# Patient Record
Sex: Female | Born: 1970 | Race: White | Hispanic: No | Smoking: Former smoker
Health system: Southern US, Community
[De-identification: ages and names within clinical notes are randomized; demographics above are authoritative.]

## PROBLEM LIST (undated history)

## (undated) DIAGNOSIS — I739 Peripheral vascular disease, unspecified: Secondary | ICD-10-CM

## (undated) DIAGNOSIS — K219 Gastro-esophageal reflux disease without esophagitis: Secondary | ICD-10-CM

## (undated) DIAGNOSIS — Z7982 Long term (current) use of aspirin: Secondary | ICD-10-CM

## (undated) DIAGNOSIS — E119 Type 2 diabetes mellitus without complications: Secondary | ICD-10-CM

## (undated) DIAGNOSIS — I700 Atherosclerosis of aorta: Secondary | ICD-10-CM

## (undated) DIAGNOSIS — I82401 Acute embolism and thrombosis of unspecified deep veins of right lower extremity: Secondary | ICD-10-CM

## (undated) DIAGNOSIS — E785 Hyperlipidemia, unspecified: Secondary | ICD-10-CM

## (undated) DIAGNOSIS — I2109 ST elevation (STEMI) myocardial infarction involving other coronary artery of anterior wall: Secondary | ICD-10-CM

## (undated) DIAGNOSIS — Z720 Tobacco use: Secondary | ICD-10-CM

## (undated) DIAGNOSIS — I255 Ischemic cardiomyopathy: Secondary | ICD-10-CM

## (undated) DIAGNOSIS — I5020 Unspecified systolic (congestive) heart failure: Secondary | ICD-10-CM

## (undated) DIAGNOSIS — I219 Acute myocardial infarction, unspecified: Secondary | ICD-10-CM

## (undated) DIAGNOSIS — J449 Chronic obstructive pulmonary disease, unspecified: Secondary | ICD-10-CM

## (undated) DIAGNOSIS — D494 Neoplasm of unspecified behavior of bladder: Secondary | ICD-10-CM

## (undated) DIAGNOSIS — Z7902 Long term (current) use of antithrombotics/antiplatelets: Secondary | ICD-10-CM

## (undated) DIAGNOSIS — Z7901 Long term (current) use of anticoagulants: Secondary | ICD-10-CM

## (undated) DIAGNOSIS — I10 Essential (primary) hypertension: Secondary | ICD-10-CM

## (undated) DIAGNOSIS — I951 Orthostatic hypotension: Secondary | ICD-10-CM

## (undated) DIAGNOSIS — F909 Attention-deficit hyperactivity disorder, unspecified type: Secondary | ICD-10-CM

## (undated) DIAGNOSIS — D329 Benign neoplasm of meninges, unspecified: Secondary | ICD-10-CM

## (undated) DIAGNOSIS — I2510 Atherosclerotic heart disease of native coronary artery without angina pectoris: Secondary | ICD-10-CM

## (undated) HISTORY — DX: Ischemic cardiomyopathy: I25.5

## (undated) HISTORY — DX: Orthostatic hypotension: I95.1

## (undated) HISTORY — PX: CHOLECYSTECTOMY: SHX55

## (undated) HISTORY — DX: Peripheral vascular disease, unspecified: I73.9

## (undated) HISTORY — DX: Type 2 diabetes mellitus without complications: E11.9

## (undated) HISTORY — DX: Tobacco use: Z72.0

## (undated) HISTORY — DX: Hyperlipidemia, unspecified: E78.5

## (undated) HISTORY — DX: Unspecified systolic (congestive) heart failure: I50.20

## (undated) HISTORY — DX: Atherosclerotic heart disease of native coronary artery without angina pectoris: I25.10

---

## 2008-08-09 ENCOUNTER — Emergency Department: Payer: Self-pay | Admitting: Emergency Medicine

## 2011-03-24 ENCOUNTER — Emergency Department: Payer: Self-pay

## 2011-08-02 ENCOUNTER — Emergency Department: Payer: Self-pay | Admitting: Emergency Medicine

## 2011-08-02 LAB — URINALYSIS, COMPLETE
Glucose,UR: >=500 mg/dL (ref 0–75)
Leukocyte Esterase: NEGATIVE
Nitrite: NEGATIVE
Ph: 6 (ref 4.5–8.0)
Protein: NEGATIVE
RBC,UR: NONE SEEN /HPF (ref 0–5)
WBC UR: 1 /HPF (ref 0–5)

## 2012-08-30 ENCOUNTER — Emergency Department: Payer: Self-pay | Admitting: Emergency Medicine

## 2012-08-30 LAB — URINALYSIS, COMPLETE
Bilirubin,UR: NEGATIVE
Ketone: NEGATIVE
Nitrite: NEGATIVE
Ph: 6 (ref 4.5–8.0)
Protein: NEGATIVE
RBC,UR: 1 /HPF (ref 0–5)
Squamous Epithelial: 1

## 2014-03-16 ENCOUNTER — Emergency Department: Payer: Self-pay | Admitting: Emergency Medicine

## 2014-08-03 IMAGING — US US PELV - US TRANSVAGINAL
1 series · 14 of 25 positions shown · non-contrast
Comparison: none

REASON FOR EXAM: pelvic pain
COMMENTS:

PROCEDURE:     US  - US PELVIS EXAM W/TRANSVAGINAL  - August 30, 2012  [DATE]
RESULT:     Comparison: None
TECHNIQUE: Multiple transabdominal gray-scale images and endovaginal
gray-scale images with doppler images of the pelvis performed.

[Series 1: us pelv - us transvaginal · 0.28mm/px · 14 of 44 slices shown]
[im 1/44]
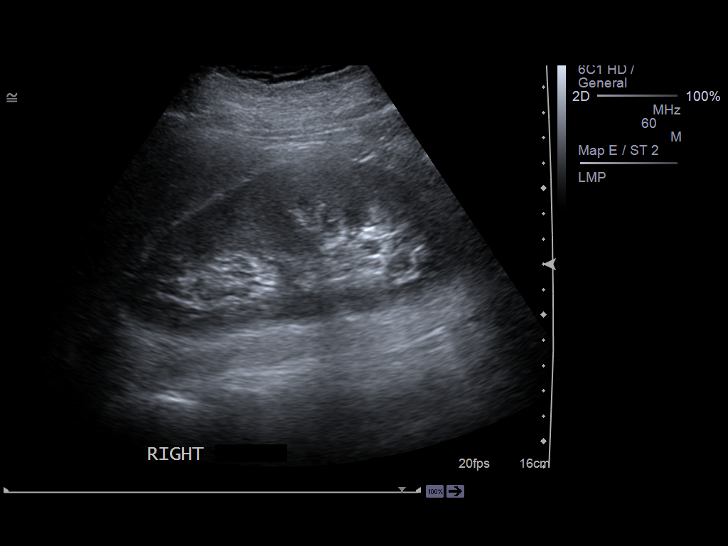
[im 4/44]
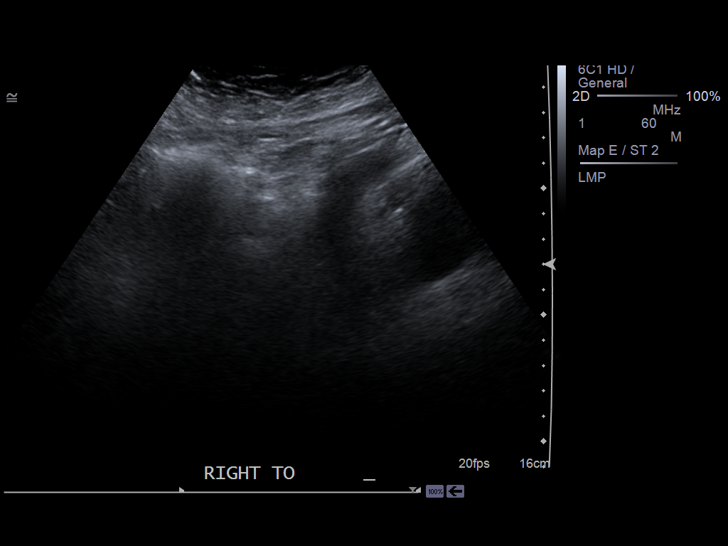
[im 8/44]
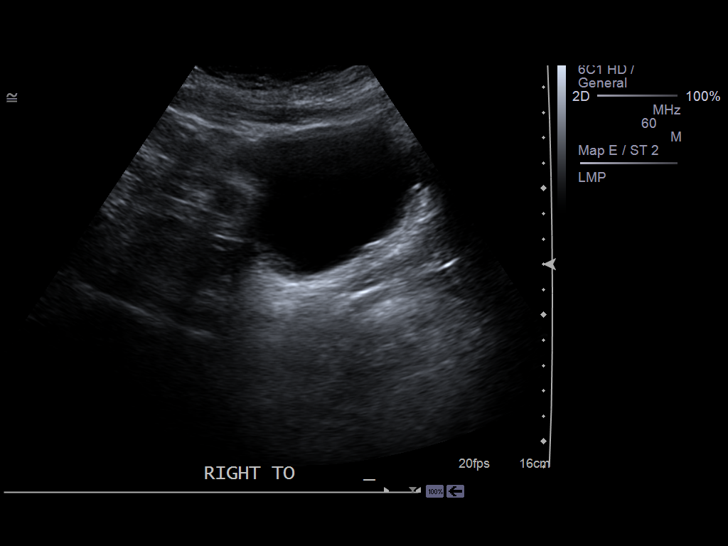
[im 11/44]
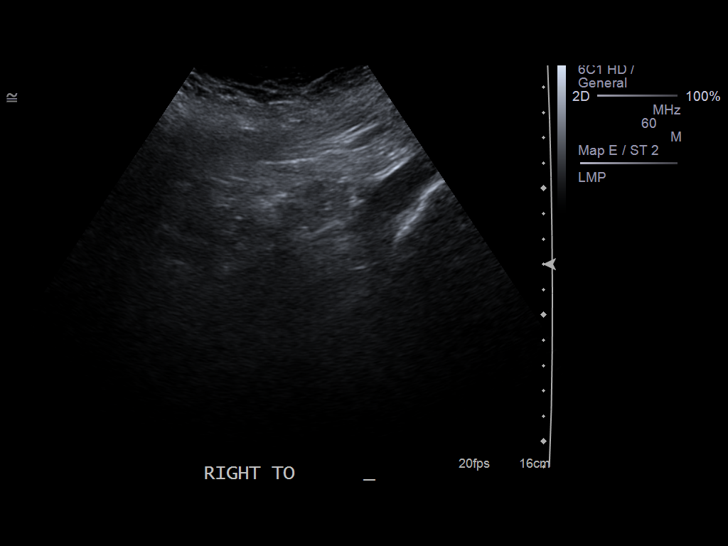
[im 15/44]
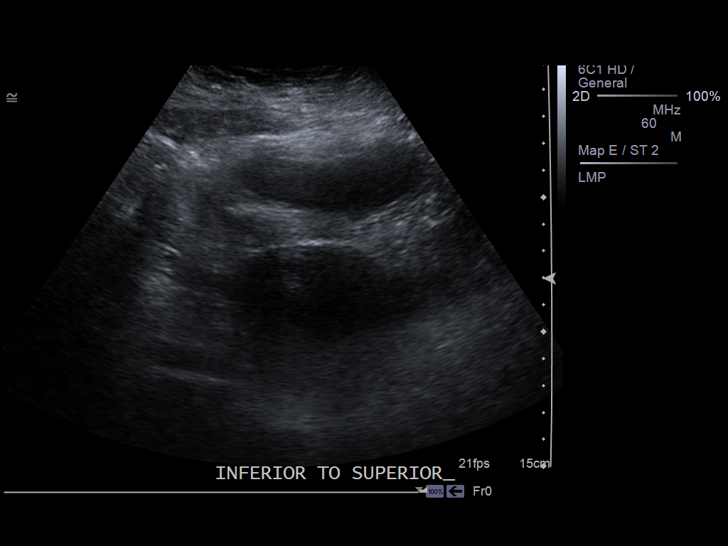
[im 17/44]
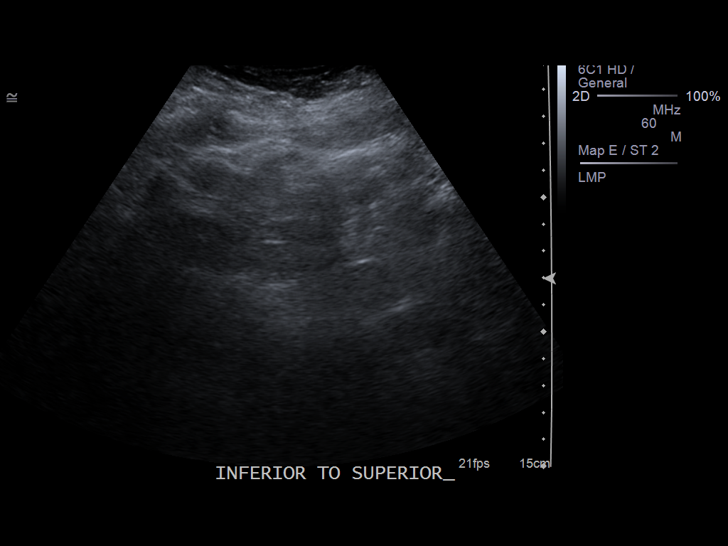
[im 20/44]
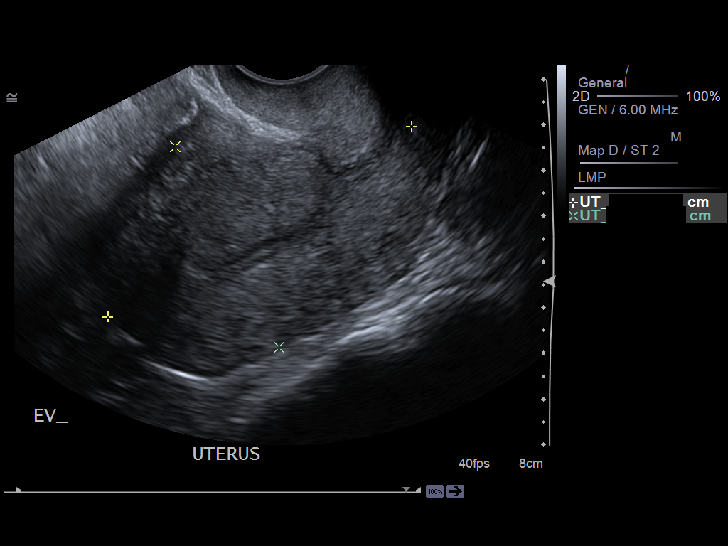
[im 24/44]
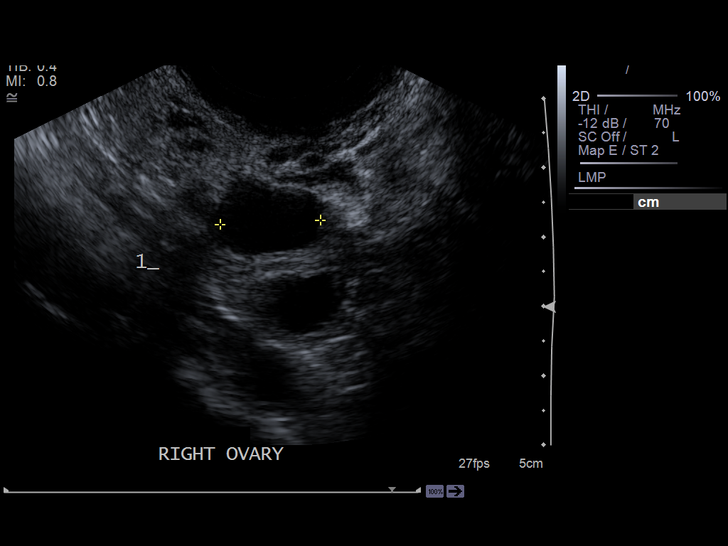
[im 27/44]
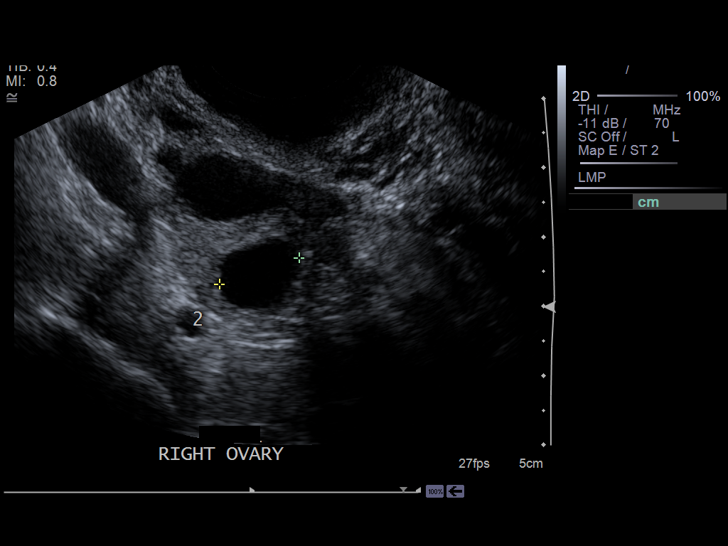
[im 29/44]
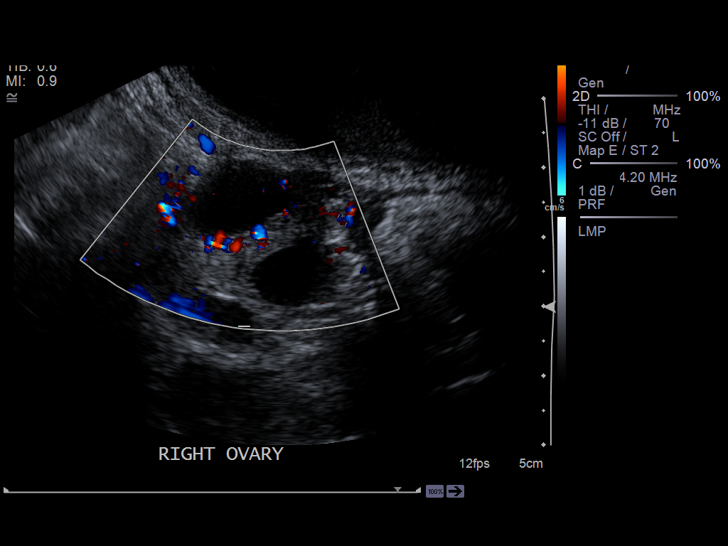
[im 33/44]
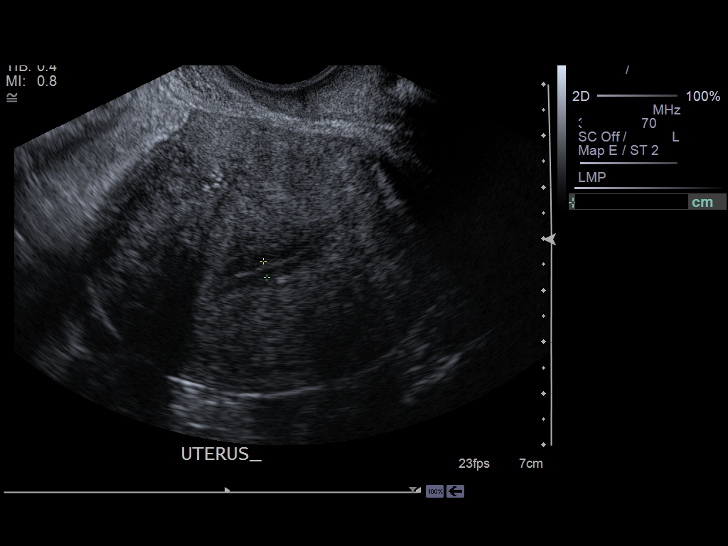
[im 36/44]
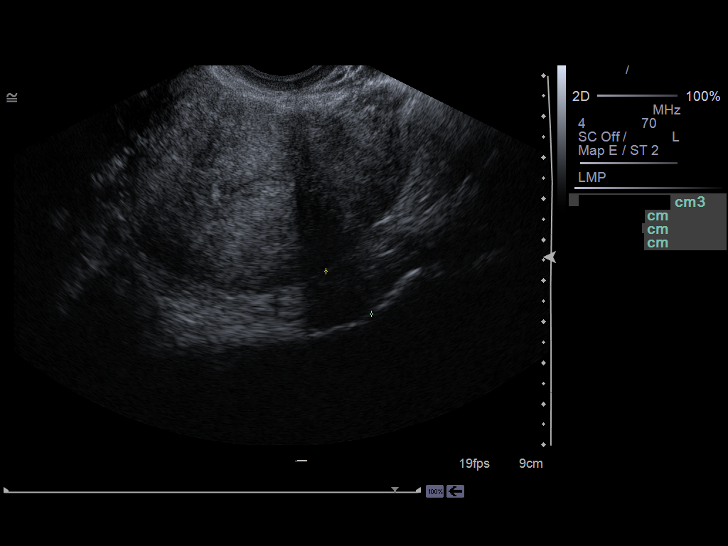
[im 40/44]
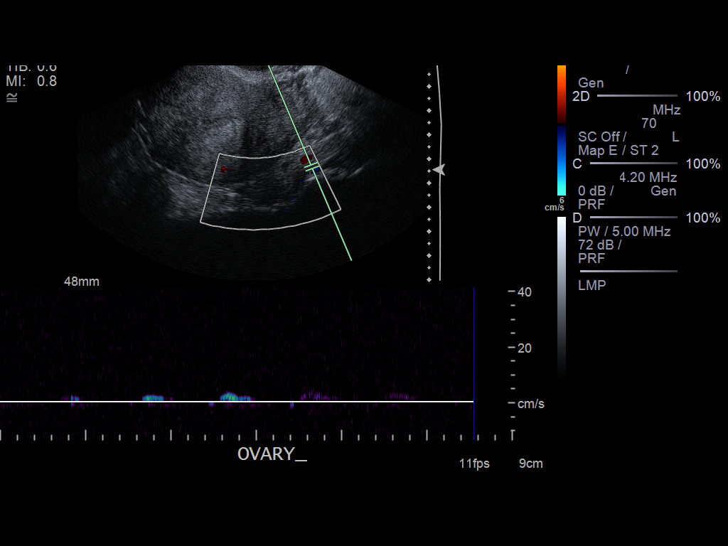
[im 44/44]
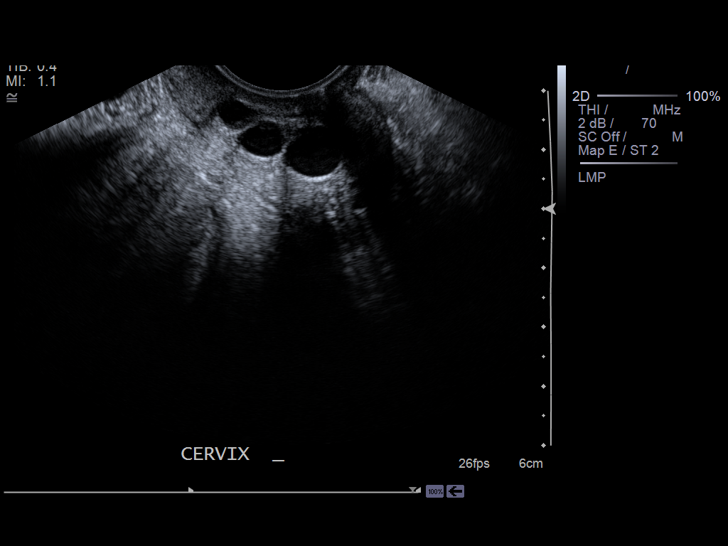

[14 of 25 positions shown; findings below may reference images not displayed]

FINDINGS: The uterus is normal in echotexture measuring 7.8 x 4.9 x 7 cm , with
transabdominal ultrasound. The endometrial stripe is uniform and homogeneous
measuring 3.2 mm.  There are no abnormal solid or cystic myometrial mass
lesions noted.

The right ovary measures 2.7 x 2.3 x 2.2 cm.  The left ovary measures 2.4 x
1.3 x 1.5 cm.  There to right ovarian physiologic ovarian cysts. Normal
Doppler flow is noted in bilateral ovaries.

There is no pelvic free fluid.
IMPRESSION: Normal pelvic ultrasound.

[REDACTED]

## 2019-04-19 ENCOUNTER — Other Ambulatory Visit: Payer: Self-pay | Admitting: Family Medicine

## 2019-04-19 ENCOUNTER — Ambulatory Visit
Admission: RE | Admit: 2019-04-19 | Discharge: 2019-04-19 | Disposition: A | Discharge status: Home / Self Care | Payer: No Typology Code available for payment source | Source: Ambulatory Visit | Attending: Family Medicine | Admitting: Family Medicine

## 2019-04-19 ENCOUNTER — Ambulatory Visit
Admission: RE | Admit: 2019-04-19 | Discharge: 2019-04-19 | Disposition: A | Discharge status: Home / Self Care | Payer: Worker's Compensation | Source: Ambulatory Visit | Attending: Family Medicine | Admitting: Family Medicine

## 2019-04-19 DIAGNOSIS — M25562 Pain in left knee: Secondary | ICD-10-CM | POA: Insufficient documentation

## 2019-04-19 DIAGNOSIS — W19XXXA Unspecified fall, initial encounter: Secondary | ICD-10-CM

## 2019-05-08 DIAGNOSIS — M25562 Pain in left knee: Secondary | ICD-10-CM | POA: Insufficient documentation

## 2019-06-06 DIAGNOSIS — S8002XA Contusion of left knee, initial encounter: Secondary | ICD-10-CM | POA: Insufficient documentation

## 2019-07-10 DIAGNOSIS — M84362A Stress fracture, left tibia, initial encounter for fracture: Secondary | ICD-10-CM | POA: Insufficient documentation

## 2020-10-23 ENCOUNTER — Other Ambulatory Visit: Payer: Self-pay | Admitting: Internal Medicine

## 2020-10-23 DIAGNOSIS — E118 Type 2 diabetes mellitus with unspecified complications: Secondary | ICD-10-CM | POA: Insufficient documentation

## 2020-10-23 DIAGNOSIS — E119 Type 2 diabetes mellitus without complications: Secondary | ICD-10-CM | POA: Insufficient documentation

## 2020-10-23 DIAGNOSIS — Z720 Tobacco use: Secondary | ICD-10-CM | POA: Insufficient documentation

## 2020-10-23 DIAGNOSIS — R10811 Right upper quadrant abdominal tenderness: Secondary | ICD-10-CM

## 2020-10-23 DIAGNOSIS — I10 Essential (primary) hypertension: Secondary | ICD-10-CM | POA: Insufficient documentation

## 2020-11-10 ENCOUNTER — Other Ambulatory Visit: Payer: Self-pay

## 2020-11-10 ENCOUNTER — Ambulatory Visit
Admission: RE | Admit: 2020-11-10 | Discharge: 2020-11-10 | Disposition: A | Discharge status: Home / Self Care | Payer: No Typology Code available for payment source | Source: Ambulatory Visit | Attending: Internal Medicine | Admitting: Internal Medicine

## 2020-11-10 DIAGNOSIS — R10811 Right upper quadrant abdominal tenderness: Secondary | ICD-10-CM | POA: Insufficient documentation

## 2020-11-20 ENCOUNTER — Other Ambulatory Visit
Admission: RE | Admit: 2020-11-20 | Discharge: 2020-11-20 | Disposition: A | Discharge status: Home / Self Care | Payer: No Typology Code available for payment source | Source: Ambulatory Visit | Attending: Internal Medicine | Admitting: Internal Medicine

## 2020-11-20 DIAGNOSIS — M79662 Pain in left lower leg: Secondary | ICD-10-CM | POA: Insufficient documentation

## 2020-11-20 DIAGNOSIS — M7989 Other specified soft tissue disorders: Secondary | ICD-10-CM | POA: Diagnosis present

## 2020-11-20 LAB — D-DIMER, QUANTITATIVE: D-Dimer, Quant: 0.42 ug/mL-FEU (ref 0.00–0.50)

## 2020-12-09 ENCOUNTER — Other Ambulatory Visit: Payer: Self-pay

## 2020-12-09 ENCOUNTER — Inpatient Hospital Stay
Admission: EM | Admit: 2020-12-09 | Discharge: 2020-12-16 | DRG: 270 | Disposition: A | Discharge status: Home / Self Care | Payer: No Typology Code available for payment source | Attending: Internal Medicine | Admitting: Internal Medicine

## 2020-12-09 ENCOUNTER — Emergency Department: Payer: No Typology Code available for payment source

## 2020-12-09 DIAGNOSIS — E1151 Type 2 diabetes mellitus with diabetic peripheral angiopathy without gangrene: Principal | ICD-10-CM | POA: Diagnosis present

## 2020-12-09 DIAGNOSIS — R600 Localized edema: Secondary | ICD-10-CM

## 2020-12-09 DIAGNOSIS — E1165 Type 2 diabetes mellitus with hyperglycemia: Secondary | ICD-10-CM | POA: Diagnosis present

## 2020-12-09 DIAGNOSIS — Z79899 Other long term (current) drug therapy: Secondary | ICD-10-CM

## 2020-12-09 DIAGNOSIS — E876 Hypokalemia: Secondary | ICD-10-CM | POA: Diagnosis present

## 2020-12-09 DIAGNOSIS — E119 Type 2 diabetes mellitus without complications: Secondary | ICD-10-CM | POA: Insufficient documentation

## 2020-12-09 DIAGNOSIS — R55 Syncope and collapse: Secondary | ICD-10-CM | POA: Diagnosis not present

## 2020-12-09 DIAGNOSIS — Z20822 Contact with and (suspected) exposure to covid-19: Secondary | ICD-10-CM | POA: Diagnosis present

## 2020-12-09 DIAGNOSIS — Z8249 Family history of ischemic heart disease and other diseases of the circulatory system: Secondary | ICD-10-CM

## 2020-12-09 DIAGNOSIS — I110 Hypertensive heart disease with heart failure: Secondary | ICD-10-CM | POA: Diagnosis present

## 2020-12-09 DIAGNOSIS — E785 Hyperlipidemia, unspecified: Secondary | ICD-10-CM | POA: Diagnosis present

## 2020-12-09 DIAGNOSIS — I255 Ischemic cardiomyopathy: Secondary | ICD-10-CM | POA: Diagnosis present

## 2020-12-09 DIAGNOSIS — I70223 Atherosclerosis of native arteries of extremities with rest pain, bilateral legs: Secondary | ICD-10-CM | POA: Diagnosis present

## 2020-12-09 DIAGNOSIS — I2510 Atherosclerotic heart disease of native coronary artery without angina pectoris: Secondary | ICD-10-CM | POA: Diagnosis present

## 2020-12-09 DIAGNOSIS — R42 Dizziness and giddiness: Secondary | ICD-10-CM

## 2020-12-09 DIAGNOSIS — I5021 Acute systolic (congestive) heart failure: Secondary | ICD-10-CM | POA: Diagnosis present

## 2020-12-09 DIAGNOSIS — I998 Other disorder of circulatory system: Secondary | ICD-10-CM | POA: Diagnosis not present

## 2020-12-09 DIAGNOSIS — M7989 Other specified soft tissue disorders: Secondary | ICD-10-CM

## 2020-12-09 DIAGNOSIS — R710 Precipitous drop in hematocrit: Secondary | ICD-10-CM | POA: Diagnosis not present

## 2020-12-09 DIAGNOSIS — F17210 Nicotine dependence, cigarettes, uncomplicated: Secondary | ICD-10-CM | POA: Diagnosis present

## 2020-12-09 DIAGNOSIS — I10 Essential (primary) hypertension: Secondary | ICD-10-CM

## 2020-12-09 DIAGNOSIS — I959 Hypotension, unspecified: Secondary | ICD-10-CM | POA: Diagnosis not present

## 2020-12-09 HISTORY — DX: Essential (primary) hypertension: I10

## 2020-12-09 HISTORY — DX: Type 2 diabetes mellitus without complications: E11.9

## 2020-12-09 LAB — CBC
HCT: 39.2 % (ref 36.0–46.0)
Hemoglobin: 13.5 g/dL (ref 12.0–15.0)
MCH: 29 pg (ref 26.0–34.0)
MCHC: 34.4 g/dL (ref 30.0–36.0)
MCV: 84.1 fL (ref 80.0–100.0)
Platelets: 230 10*3/uL (ref 150–400)
RBC: 4.66 MIL/uL (ref 3.87–5.11)
RDW: 12.7 % (ref 11.5–15.5)
WBC: 4.7 10*3/uL (ref 4.0–10.5)
nRBC: 0 % (ref 0.0–0.2)

## 2020-12-09 LAB — BASIC METABOLIC PANEL
Anion gap: 22 — ABNORMAL HIGH (ref 5–15)
Anion gap: 16 — ABNORMAL HIGH (ref 5–15)
BUN: 25 mg/dL — ABNORMAL HIGH (ref 6–20)
BUN: 23 mg/dL — ABNORMAL HIGH (ref 6–20)
CO2: 23 mmol/L (ref 22–32)
CO2: 23 mmol/L (ref 22–32)
Calcium: 9.2 mg/dL (ref 8.9–10.3)
Calcium: 8.5 mg/dL — ABNORMAL LOW (ref 8.9–10.3)
Chloride: 87 mmol/L — ABNORMAL LOW (ref 98–111)
Chloride: 93 mmol/L — ABNORMAL LOW (ref 98–111)
Creatinine, Ser: 0.92 mg/dL (ref 0.44–1.00)
Creatinine, Ser: 0.87 mg/dL (ref 0.44–1.00)
GFR, Estimated: >60 mL/min (ref >60)
GFR, Estimated: >60 mL/min (ref >60)
Glucose, Bld: 392 mg/dL — ABNORMAL HIGH (ref 70–99)
Glucose, Bld: 493 mg/dL — ABNORMAL HIGH (ref 70–99)
Potassium: 2.4 mmol/L — CL (ref 3.5–5.1)
Potassium: 3 mmol/L — ABNORMAL LOW (ref 3.5–5.1)
Sodium: 132 mmol/L — ABNORMAL LOW (ref 135–145)
Sodium: 132 mmol/L — ABNORMAL LOW (ref 135–145)

## 2020-12-09 LAB — PROTIME-INR
INR: 1.2 (ref 0.8–1.2)
Prothrombin Time: 15 seconds (ref 11.4–15.2)

## 2020-12-09 LAB — HEPATIC FUNCTION PANEL
ALT: 12 U/L (ref 0–44)
AST: 15 U/L (ref 15–41)
Albumin: 3.9 g/dL (ref 3.5–5.0)
Alkaline Phosphatase: 59 U/L (ref 38–126)
Bilirubin, Direct: <0.1 mg/dL (ref 0.0–0.2)
Total Bilirubin: 2.2 mg/dL — ABNORMAL HIGH (ref 0.3–1.2)
Total Protein: 7.4 g/dL (ref 6.5–8.1)

## 2020-12-09 LAB — APTT: aPTT: 78 seconds — ABNORMAL HIGH (ref 24–36)

## 2020-12-09 LAB — TROPONIN I (HIGH SENSITIVITY)
Troponin I (High Sensitivity): 28 ng/L — ABNORMAL HIGH (ref <18)
Troponin I (High Sensitivity): 27 ng/L — ABNORMAL HIGH (ref <18)

## 2020-12-09 LAB — BRAIN NATRIURETIC PEPTIDE: B Natriuretic Peptide: 106.3 pg/mL — ABNORMAL HIGH (ref 0.0–100.0)

## 2020-12-09 MED ORDER — HYDROCODONE-ACETAMINOPHEN 5-325 MG PO TABS
1.0000 | ORAL_TABLET | ORAL | Status: DC | PRN
  Administered 2020-12-10 – 2020-12-11 (×5): 1 via ORAL
  Administered 2020-12-12 – 2020-12-13 (×6): 2 via ORAL
  Administered 2020-12-13 – 2020-12-14 (×6): 1 via ORAL
  Administered 2020-12-14 – 2020-12-15 (×3): 2 via ORAL
  Administered 2020-12-15 (×2): 1 via ORAL
  Administered 2020-12-16 (×4): 2 via ORAL
  Filled 2020-12-09 (×4): qty 2
  Filled 2020-12-09 (×2): qty 1
  Filled 2020-12-09: qty 2
  Filled 2020-12-09 (×3): qty 1
  Filled 2020-12-09: qty 2
  Filled 2020-12-09 (×2): qty 1
  Filled 2020-12-09: qty 2
  Filled 2020-12-09 (×2): qty 1
  Filled 2020-12-09 (×7): qty 2
  Filled 2020-12-09: qty 1
  Filled 2020-12-09: qty 2
  Filled 2020-12-09: qty 1

## 2020-12-09 MED ORDER — POTASSIUM CHLORIDE 10 MEQ/100ML IV SOLN
10.0000 meq | INTRAVENOUS | Status: AC
  Administered 2020-12-09 (×2): 10 meq via INTRAVENOUS
  Filled 2020-12-09 (×2): qty 100

## 2020-12-09 MED ORDER — ACETAMINOPHEN 650 MG RE SUPP
650.0000 mg | Freq: Four times a day (QID) | RECTAL | Status: DC | PRN
  Filled 2020-12-09: qty 1

## 2020-12-09 MED ORDER — POTASSIUM CHLORIDE CRYS ER 20 MEQ PO TBCR
40.0000 meq | EXTENDED_RELEASE_TABLET | Freq: Once | ORAL | Status: AC
  Administered 2020-12-09: 40 meq via ORAL
  Filled 2020-12-09: qty 2

## 2020-12-09 MED ORDER — HYDRALAZINE HCL 20 MG/ML IJ SOLN: 5.0000 mg | INTRAMUSCULAR | Status: DC | PRN

## 2020-12-09 MED ORDER — INSULIN ASPART 100 UNIT/ML IJ SOLN
0.0000 [IU] | INTRAMUSCULAR | Status: DC
Start: 2020-12-10 — End: 2020-12-12
  Administered 2020-12-10: 8 [IU] via SUBCUTANEOUS
  Administered 2020-12-10: 15 [IU] via SUBCUTANEOUS
  Administered 2020-12-10: 5 [IU] via SUBCUTANEOUS
  Administered 2020-12-10: 11 [IU] via SUBCUTANEOUS
  Administered 2020-12-11 (×2): 15 [IU] via SUBCUTANEOUS
  Administered 2020-12-11: 12:00:00 3 [IU] via SUBCUTANEOUS
  Administered 2020-12-11: 04:00:00 8 [IU] via SUBCUTANEOUS
  Administered 2020-12-11: 09:00:00 2 [IU] via SUBCUTANEOUS
  Administered 2020-12-12: 8 [IU] via SUBCUTANEOUS
  Administered 2020-12-12: 5 [IU] via SUBCUTANEOUS
  Filled 2020-12-09 (×10): qty 1

## 2020-12-09 MED ORDER — ACETAMINOPHEN 325 MG PO TABS: 650.0000 mg | ORAL_TABLET | Freq: Four times a day (QID) | ORAL | Status: DC | PRN

## 2020-12-09 MED ORDER — MORPHINE SULFATE (PF) 2 MG/ML IV SOLN
2.0000 mg | INTRAVENOUS | Status: DC | PRN
  Administered 2020-12-10 – 2020-12-14 (×12): 2 mg via INTRAVENOUS
  Filled 2020-12-09 (×13): qty 1

## 2020-12-09 MED ORDER — HEPARIN SODIUM (PORCINE) 5000 UNIT/ML IJ SOLN: 4000.0000 [IU] | Freq: Once | INTRAMUSCULAR | Status: DC

## 2020-12-09 MED ORDER — HEPARIN BOLUS VIA INFUSION
3000.0000 [IU] | Freq: Once | INTRAVENOUS | Status: AC
  Administered 2020-12-09: 3000 [IU] via INTRAVENOUS
  Filled 2020-12-09: qty 3000

## 2020-12-09 MED ORDER — ONDANSETRON HCL 4 MG PO TABS: 4.0000 mg | ORAL_TABLET | Freq: Four times a day (QID) | ORAL | Status: DC | PRN

## 2020-12-09 MED ORDER — SODIUM CHLORIDE 0.9 % IV BOLUS
1000.0000 mL | Freq: Once | INTRAVENOUS | Status: AC
  Administered 2020-12-09: 1000 mL via INTRAVENOUS

## 2020-12-09 MED ORDER — HEPARIN (PORCINE) 25000 UT/250ML-% IV SOLN
1000.0000 [IU]/h | INTRAVENOUS | Status: DC
  Administered 2020-12-09: 600 [IU]/h via INTRAVENOUS
  Administered 2020-12-11: 900 [IU]/h via INTRAVENOUS
  Filled 2020-12-09 (×2): qty 250

## 2020-12-09 MED ORDER — ONDANSETRON HCL 4 MG/2ML IJ SOLN
4.0000 mg | Freq: Four times a day (QID) | INTRAMUSCULAR | Status: DC | PRN
  Administered 2020-12-10: 4 mg via INTRAVENOUS
  Filled 2020-12-09: qty 2

## 2020-12-09 NOTE — ED Provider Notes (Signed)
[facility] Emergency Department Provider Note ____________________________________________   Event Date/Time   First MD Initiated Contact with Patient 12/09/20 1718     (approximate)  I have reviewed the triage vital signs and the nursing notes.  HISTORY  Chief Complaint Edema   HPI Beth Carroll is a 51 y.o. femalewho presents to the ED for evaluation of dizziness.   Chart review indicates HTN and DM.  Lifelong smoker.  Reviewed multiple outpatient visits with complaints of lower extremity edema, she has recently finished a round of antibiotics, doxycycline, to cover for cellulitis.  Edema worsening despite compression and elevation.  She was empirically started on Lasix.  Patient presents to the ED for evaluation of presyncopal dizziness.  She reports that her bilateral legs, left greater than right, have been swollen for a few months.  She reports getting a venous ultrasound and ruled out acute DVT during these outpatient visits.  She was started on Lasix and had some mild improvement.  Reports it may have been a little bit better after doxycycline course as well.  She reports it persists and does not know why. Reports increased lower extremity pain with ambulation, improving with rest but taking quite a while to improve.  Further reports some rest pain intermittently..  She reports for the past few days she has felt nausea and poor appetite without abdominal pain or emesis.  Reports poor p.o. intake but continuing to take her Lasix.  Reports worsening presyncopal dizziness upon standing over the past couple days.  Denies any syncopal episodes, falls, vertigo, headache, fever.  Reports no dizziness right now, while supine, but reports concern that she may if she stands up.  She is hungry and requesting food.  Reports her legs seem no different than they have for the past couple months.  Past Medical History:  Diagnosis Date   Diabetes mellitus without  complication ([place])    Hypertension     There are no problems to display for this patient.    Prior to Admission medications   Not on File    Allergies Patient has no known allergies.  No family history on file.  Social History    Review of Systems  Constitutional: No fever/chills Eyes: No visual changes. ENT: No sore throat. Cardiovascular: Denies chest pain. Respiratory: Denies shortness of breath. Gastrointestinal: No abdominal pain.  No nausea, no vomiting.  No diarrhea.  No constipation. Genitourinary: Negative for dysuria. Musculoskeletal: Negative for back pain. Skin: Negative for rash. Neurological: Negative for headaches, focal weakness or numbness. ____________________________________________   PHYSICAL EXAM:  VITAL SIGNS: Vitals:   12/09/20 1858 12/09/20 2048  BP: (!) 149/76 (!) 161/82  Pulse: 81 80  Resp: 20 18  Temp:    SpO2: 94% 94%    Constitutional: Alert and oriented. Well appearing and in no acute distress. Eyes: Conjunctivae are normal. PERRL. EOMI. Head: Atraumatic. Nose: No congestion/rhinnorhea. Mouth/Throat: Mucous membranes are moist.  Oropharynx non-erythematous. Neck: No stridor. No cervical spine tenderness to palpation. Cardiovascular: Normal rate, regular rhythm. Grossly normal heart sounds.    Poor capillary refill to bilateral lower extremities.  Quite slow.  Unable to palpate DP or PT pulses.  Have difficulty with a Doppler obtaining pulses reliably as well.   Respiratory: Normal respiratory effort.  No retractions. Lungs CTAB. Gastrointestinal: Soft , nondistended, nontender to palpation. No CVA tenderness. Musculoskeletal: Edematous bilateral lower extremities, left greater than right.  No warmth to the touch, induration or fluctuance.  Distally neurovascularly intact. Neurologic:  Normal speech and language. No gross focal neurologic deficits are appreciated.  Cranial nerves II through XII intact 5/5 strength and sensation  in all 4 extremities Skin:  Skin is warm, dry and intact. No rash noted. Psychiatric: Mood and affect are normal. Speech and behavior are normal. ____________________________________________   LABS (all labs ordered are listed, but only abnormal results are displayed)  Labs Reviewed  BASIC METABOLIC PANEL - Abnormal; Notable for the following components:      Result Value   Sodium 132 (*)    Potassium 2.4 (*)    Chloride 87 (*)    Glucose, Bld 392 (*)    BUN 25 (*)    Anion gap 22 (*)    All other components within normal limits  HEPATIC FUNCTION PANEL - Abnormal; Notable for the following components:   Total Bilirubin 2.2 (*)    All other components within normal limits  BRAIN NATRIURETIC PEPTIDE - Abnormal; Notable for the following components:   B Natriuretic Peptide 106.3 (*)    All other components within normal limits  TROPONIN I (HIGH SENSITIVITY) - Abnormal; Notable for the following components:   Troponin I (High Sensitivity) 28 (*)    All other components within normal limits  TROPONIN I (HIGH SENSITIVITY) - Abnormal; Notable for the following components:   Troponin I (High Sensitivity) 27 (*)    All other components within normal limits  CBC  BASIC METABOLIC PANEL  PROTIME-INR  APTT   ____________________________________________  12 Lead EKG  Sinus rhythm, rate of 76 bpm.  Leftward axis.  Prolonged QTC at 531 ms.  No STEMI. ____________________________________________  RADIOLOGY  ED MD interpretation:  CXR reviewed by me without evidence of acute cardiopulmonary pathology.   Official radiology report(s): DG Chest 2 View  Result Date: 12/09/2020 CLINICAL DATA:  Bilateral lower extremity swelling. History high blood pressure and diabetes S. EXAM: CHEST - 2 VIEW COMPARISON:  None. FINDINGS: The cardiac silhouette, mediastinal and hilar contours are normal. The lungs are clear of an acute process. Mild hyperinflation. No pleural effusions or pulmonary  lesions. The bony thorax is intact. IMPRESSION: No acute cardiopulmonary findings. Electronically Signed   By: Marijo Sanes M.D.   On: 12/09/2020 15:37    ____________________________________________   PROCEDURES and INTERVENTIONS  Procedure(s) performed (including Critical Care):  .1-3 Lead EKG Interpretation Performed by: Vladimir Crofts, MD Authorized by: Vladimir Crofts, MD     Interpretation: normal     ECG rate:  80   ECG rate assessment: normal     Rhythm: sinus rhythm     Ectopy: none     Conduction: normal   .Critical Care Performed by: Vladimir Crofts, MD Authorized by: Vladimir Crofts, MD   Critical care provider statement:    Critical care time (minutes):  30   Critical care time was exclusive of:  Separately billable procedures and treating other patients   Critical care was necessary to treat or prevent imminent or life-threatening deterioration of the following conditions:  Circulatory failure   Critical care was time spent personally by me on the following activities:  Development of treatment plan with patient or surrogate, discussions with consultants, evaluation of patient's response to treatment, examination of patient, ordering and review of laboratory studies, ordering and review of radiographic studies, ordering and performing treatments and interventions, pulse oximetry, re-evaluation of patient's condition and review of old charts  Medications  heparin injection 4,000 Units (has no administration in time range)  sodium chloride 0.9 % bolus  1,000 mL (0 mLs Intravenous Stopped 12/09/20 1722)  potassium chloride SA (KLOR-CON) CR tablet 40 mEq (40 mEq Oral Given 12/09/20 1821)  potassium chloride 10 mEq in 100 mL IVPB (0 mEq Intravenous Stopped 12/09/20 2036)  potassium chloride SA (KLOR-CON) CR tablet 40 mEq (40 mEq Oral Given 12/09/20 1933)    ____________________________________________   MDM / ED COURSE   50 year old female presents to the ED with progressively  worsening chronic lower extremity edema and pain that I suspect is due to fairly severe PAD, requiring heparinization and plans for angiography tomorrow. 1st BP is hypotensive, but I suspect this is erroneous. She remains stable in the ED. she presents with presyncope with standing, that I suspect is more related to volume depletion and symptomatic hypokalemia, but with more questioning and examination I get the impression of claudication with rest pains and poor peripheral blood flow.  Poor ABIs of about 0.3 on the left, slightly better on the right.  I cannot palpate or reliably Doppler pulse to her feet bilaterally.  She does not have severe pain while supine and I see no indications for emergent angiography, but I think she would benefit from an urgent study.  I discussed the case with vascular surgery, who agrees and plans to perform angiography tomorrow.  Heparinization will be started in the ED.  I suspect her hypokalemia is related to recent initiation of Lasix.  No evidence of significant CHF or respiratory pathology.  Clinical Course as of 12/09/20 2122  Tue Dec 09, 2020  1954 Reassessed.  Patient reports feeling okay.  We discussed repeat BMP, ABIs and ambulatory trial.  We discussed the possibility of outpatient management thereafter with cardiology follow-up.  She is agreeable. [DS]  2113 Reassessed.  ABIs are terrible.  I cannot reliably palpate pulses each feet and capillary refill is quite slow.  I have difficulty even Dopplering a pulse. Discussed my concern with the patient and I paged vascular surgery. [DS]  2115 I discuss with Dr. Lucky Cowboy, he agrees that it would probably be prudent to admit her on heparin and perform angiography tomorrow. [DS]  2120 Discussed this recommendation with the patient and she is agreeable to stay. [DS]    Clinical Course User Index [DS] Vladimir Crofts, MD    ____________________________________________   FINAL CLINICAL IMPRESSION(S) / ED DIAGNOSES  Final  diagnoses:  Left leg swelling  Lower limb ischemia     ED Discharge Orders     None        Riyanna Crutchley Tamala Julian   Note:  This document was prepared using Dragon voice recognition software and may include unintentional dictation errors.    Vladimir Crofts, MD 12/09/20 2138

## 2020-12-09 NOTE — ED Notes (Signed)
Pt given meal tray and diet coke at this time.

## 2020-12-09 NOTE — H&P (Signed)
History and Physical    Beth Carroll QAS:[id number] DOB: 1970/03/24 DOA: 12/09/2020  PCP: Baxter Hire, MD   Patient coming from: home  I have personally briefly reviewed patient's relevant medical records in [place]  Chief Complaint: dizziness  HPI: Beth Carroll is a 50 y.o. female with medical history significant for DM, HTN, bilateral lower extremity edema for the past 3 months with negative venous doppler on 10/26, and with little response to empiric Lasix, compression elevated who presents to the emergency room with a several day history of nausea, poor appetite and dizziness on standing.  She continues to take her Lasix as prescribed.  She also reports bilateral lower extremity pain with ambulation that improves with rest as well as rest pain.  She denies fever or chills.  Denies chest pain no shortness of breath or cough.  Denies abdominal pain.    ED course: On arrival BP 161/82 with otherwise normal vitals Blood work: Potassium 2.4, glucose 392, troponin 28-27, BNP 806  EKG, personally viewed and interpreted: Sinus at 76 with prolonged QTC at 531 and no acute ST-T wave changes  Chest x-ray with no acute findings  ABIs done in the ED 0.3, correlating with decreased capillary refill.  The ED provider contacted vascular surgeon Dr. Lucky Cowboy who was agreeable with heparin anticoagulation in preparation for angiogram in the a.m. Potassium repleted orally in the ED  Hospitalist consulted for admission.    Review of Systems: As per HPI otherwise all other systems on review of systems negative.    Past Medical History:  Diagnosis Date   Diabetes mellitus without complication ([place])    Hypertension     History reviewed. No pertinent surgical history.   reports that she has never smoked. She has never used smokeless tobacco. She reports that she does not drink alcohol. No history on file for drug use.  No Known Allergies  History reviewed. No pertinent family  history.    Prior to Admission medications   Not on File    Physical Exam: Vitals:   12/09/20 1540 12/09/20 1728 12/09/20 1858 12/09/20 2048  BP: (!) 77/54 (!) 148/78 (!) 149/76 (!) 161/82  Pulse: 74 78 81 80  Resp: 16 20 20 18   Temp: 97.9 F (36.6 C)     TempSrc: Oral     SpO2: 100% 99% 94% 94%   Constitutional: sleepy but arousable . Not in any apparent distress HEENT:      Head: Normocephalic and atraumatic.         Eyes: PERLA, EOMI, Conjunctivae are normal. Sclera is non-icteric.       Mouth/Throat: Mucous membranes are moist.       Neck: Supple with no signs of meningismus. Cardiovascular: Regular rate and rhythm. No murmurs, gallops, or rubs.  Decreased DP pulses. No JVD.  2+ pitting edema on the left 1+ on the right Respiratory: Respiratory effort normal .Lungs sounds clear bilaterally. No wheezes, crackles, or rhonchi.  Gastrointestinal: Soft, non tender, non distended. Positive bowel sounds.  Genitourinary: No CVA tenderness. Musculoskeletal: Nontender with normal range of motion in all extremities. No cyanosis, or erythema of extremities. Neurologic:  Face is symmetric. Moving all extremities. No gross focal neurologic deficits . Skin: Skin is warm, dry.  No rash or ulcers Psychiatric: Mood and affect are appropriate    Labs on Admission: I have personally reviewed following labs and imaging studies  CBC: Recent Labs  Lab 12/09/20 1501  WBC 4.7  HGB 13.5  HCT  39.2  MCV 84.1  PLT 409   Basic Metabolic Panel: Recent Labs  Lab 12/09/20 1501 12/09/20 2056  NA 132* 132*  K 2.4* 3.0*  CL 87* 93*  CO2 23 23  GLUCOSE 392* 493*  BUN 25* 23*  CREATININE 0.92 0.87  CALCIUM 9.2 8.5*   GFR: CrCl cannot be calculated (Unknown ideal weight.). Liver Function Tests: Recent Labs  Lab 12/09/20 1501  AST 15  ALT 12  ALKPHOS 59  BILITOT 2.2*  PROT 7.4  ALBUMIN 3.9   No results for input(s): LIPASE, AMYLASE in the last 168 hours. No results for input(s):  AMMONIA in the last 168 hours. Coagulation Profile: No results for input(s): INR, PROTIME in the last 168 hours. Cardiac Enzymes: No results for input(s): CKTOTAL, CKMB, CKMBINDEX, TROPONINI in the last 168 hours. BNP (last 3 results) No results for input(s): PROBNP in the last 8760 hours. HbA1C: No results for input(s): HGBA1C in the last 72 hours. CBG: No results for input(s): GLUCAP in the last 168 hours. Lipid Profile: No results for input(s): CHOL, HDL, LDLCALC, TRIG, CHOLHDL, LDLDIRECT in the last 72 hours. Thyroid Function Tests: No results for input(s): TSH, T4TOTAL, FREET4, T3FREE, THYROIDAB in the last 72 hours. Anemia Panel: No results for input(s): VITAMINB12, FOLATE, FERRITIN, TIBC, IRON, RETICCTPCT in the last 72 hours. Urine analysis:    Component Value Date/Time   COLORURINE Straw 08/30/2012 0047   APPEARANCEUR Clear 08/30/2012 0047   LABSPEC 1.014 08/30/2012 0047   PHURINE 6.0 08/30/2012 0047   GLUCOSEU >=500 08/30/2012 0047   HGBUR Negative 08/30/2012 0047   BILIRUBINUR Negative 08/30/2012 0047   KETONESUR Negative 08/30/2012 0047   PROTEINUR Negative 08/30/2012 0047   NITRITE Negative 08/30/2012 0047   LEUKOCYTESUR 1+ 08/30/2012 0047    Radiological Exams on Admission: DG Chest 2 View  Result Date: 12/09/2020 CLINICAL DATA:  Bilateral lower extremity swelling. History high blood pressure and diabetes S. EXAM: CHEST - 2 VIEW COMPARISON:  None. FINDINGS: The cardiac silhouette, mediastinal and hilar contours are normal. The lungs are clear of an acute process. Mild hyperinflation. No pleural effusions or pulmonary lesions. The bony thorax is intact. IMPRESSION: No acute cardiopulmonary findings. Electronically Signed   By: Marijo Sanes M.D.   On: 12/09/2020 15:37    Assessment/Plan    Postural dizziness with presyncope -Suspect orthostatic hypotension from overdiuresis from Lasix in combination with poor oral intake - Given persistent leg edema, cardiac  risk factors elevated BNP will evaluate for cardiac etiology with echocardiogram - Cardiac monitoring - Orthostatic vital signs - Hold Lasix    Hyperglycemia due to type 2 diabetes mellitus (HCC) - Sliding scale insulin coverage    Hypokalemia --Continue to replete and monitor    Bilateral lower extremity edema - Venous Doppler on 10/27 negative for DVT and patient completed a course of doxycycline for possible cellulitis - BNP elevated at 106 but chest x-ray clear - Echocardiogram to evaluate left ventricular systolic function    Rest pain of both lower extremities due to atherosclerosis (HCC) - ABI of 0.3 in the ED - Continue heparin infusion started in the ED - Dr. Lucky Cowboy consulted and will perform angiogram in the a.m.    Hypertension - Continue antihypertensives    DVT prophylaxis: Heparin infusion Code Status: full code  Family Communication:  none  Disposition Plan: Back to previous home environment Consults called: Vascular  status:observation   Athena Masse MD [facility]   12/09/2020, 10:26 PM

## 2020-12-09 NOTE — ED Triage Notes (Signed)
Pt comes into the ED via EMS from home with c/o BL LE swelling was seen by Dr, Edwina Barth and placed on HCTZ and lasix.Beth Carroll   136/79 82 98%RA 98 temp

## 2020-12-09 NOTE — ED Notes (Signed)
walked to the bathroom.Pt  shuffled, but was successful. Complains of pain when she walks.  Nausea while walking has improved.

## 2020-12-09 NOTE — Progress Notes (Signed)
ANTICOAGULATION CONSULT NOTE  Pharmacy Consult for heparin Indication:  PAD / limb ischemia  No Known Allergies  Patient Measurements:   Heparin Dosing Weight: 50 kg  Vital Signs: Temp: 97.9 F (36.6 C) (11/15 1540) Temp Source: Oral (11/15 1540) BP: 161/82 (11/15 2048) Pulse Rate: 80 (11/15 2048)  Labs: Recent Labs    12/09/20 1501 12/09/20 1815 12/09/20 2056  HGB 13.5  --   --   HCT 39.2  --   --   PLT 230  --   --   CREATININE 0.92  --  0.87  TROPONINIHS 28* 27*  --     CrCl cannot be calculated (Unknown ideal weight.).   Medical History: Past Medical History:  Diagnosis Date   Diabetes mellitus without complication ([place])    Hypertension      Assessment: 50 year old female presented with edema and dizziness. No anticoagulation noted PTA. Pharmacy consult for heparin management.  Goal of Therapy:  Heparin level 0.3-0.7 units/ml Monitor platelets by anticoagulation protocol: Yes   Plan:  Most recent weight 50 kg per office visit ~ one week ago Heparin 3000 unit bolus Start heparin infusion at 600 units/hr Check HL 11/16 at 0500 CBC daily while on heparin  Tawnya Crook, PharmD, BCPS Clinical Pharmacist 12/09/2020 9:48 PM

## 2020-12-09 NOTE — ED Notes (Signed)
R Arm 161/82 L Arm 141/76 R Leg 90/65 L Leg 46/12

## 2020-12-10 ENCOUNTER — Encounter: Payer: Self-pay | Admitting: Vascular Surgery

## 2020-12-10 ENCOUNTER — Encounter
Admission: EM | Disposition: A | Discharge status: Home / Self Care | Payer: Self-pay | Source: Home / Self Care | Attending: Internal Medicine

## 2020-12-10 ENCOUNTER — Encounter: Payer: Self-pay | Admitting: Certified Registered"

## 2020-12-10 ENCOUNTER — Inpatient Hospital Stay (HOSPITAL_COMMUNITY)
Admit: 2020-12-10 | Discharge: 2020-12-10 | Disposition: A | Discharge status: Home / Self Care | Payer: No Typology Code available for payment source | Attending: Vascular Surgery | Admitting: Vascular Surgery

## 2020-12-10 DIAGNOSIS — E876 Hypokalemia: Secondary | ICD-10-CM | POA: Diagnosis present

## 2020-12-10 DIAGNOSIS — E1151 Type 2 diabetes mellitus with diabetic peripheral angiopathy without gangrene: Secondary | ICD-10-CM | POA: Diagnosis present

## 2020-12-10 DIAGNOSIS — I25118 Atherosclerotic heart disease of native coronary artery with other forms of angina pectoris: Secondary | ICD-10-CM | POA: Diagnosis not present

## 2020-12-10 DIAGNOSIS — E1165 Type 2 diabetes mellitus with hyperglycemia: Secondary | ICD-10-CM | POA: Diagnosis present

## 2020-12-10 DIAGNOSIS — Z79899 Other long term (current) drug therapy: Secondary | ICD-10-CM | POA: Diagnosis not present

## 2020-12-10 DIAGNOSIS — I110 Hypertensive heart disease with heart failure: Secondary | ICD-10-CM | POA: Diagnosis present

## 2020-12-10 DIAGNOSIS — Z794 Long term (current) use of insulin: Secondary | ICD-10-CM | POA: Diagnosis not present

## 2020-12-10 DIAGNOSIS — I959 Hypotension, unspecified: Secondary | ICD-10-CM | POA: Diagnosis not present

## 2020-12-10 DIAGNOSIS — Z8249 Family history of ischemic heart disease and other diseases of the circulatory system: Secondary | ICD-10-CM | POA: Diagnosis not present

## 2020-12-10 DIAGNOSIS — E785 Hyperlipidemia, unspecified: Secondary | ICD-10-CM | POA: Diagnosis present

## 2020-12-10 DIAGNOSIS — I998 Other disorder of circulatory system: Secondary | ICD-10-CM | POA: Diagnosis present

## 2020-12-10 DIAGNOSIS — R600 Localized edema: Secondary | ICD-10-CM | POA: Diagnosis not present

## 2020-12-10 DIAGNOSIS — Z20822 Contact with and (suspected) exposure to covid-19: Secondary | ICD-10-CM | POA: Diagnosis present

## 2020-12-10 DIAGNOSIS — R710 Precipitous drop in hematocrit: Secondary | ICD-10-CM | POA: Diagnosis not present

## 2020-12-10 DIAGNOSIS — I420 Dilated cardiomyopathy: Secondary | ICD-10-CM | POA: Diagnosis not present

## 2020-12-10 DIAGNOSIS — I5031 Acute diastolic (congestive) heart failure: Secondary | ICD-10-CM

## 2020-12-10 DIAGNOSIS — F17210 Nicotine dependence, cigarettes, uncomplicated: Secondary | ICD-10-CM | POA: Diagnosis present

## 2020-12-10 DIAGNOSIS — I10 Essential (primary) hypertension: Secondary | ICD-10-CM | POA: Diagnosis not present

## 2020-12-10 DIAGNOSIS — I5021 Acute systolic (congestive) heart failure: Secondary | ICD-10-CM | POA: Diagnosis present

## 2020-12-10 DIAGNOSIS — I70222 Atherosclerosis of native arteries of extremities with rest pain, left leg: Secondary | ICD-10-CM | POA: Diagnosis not present

## 2020-12-10 DIAGNOSIS — I70223 Atherosclerosis of native arteries of extremities with rest pain, bilateral legs: Secondary | ICD-10-CM | POA: Diagnosis present

## 2020-12-10 DIAGNOSIS — R42 Dizziness and giddiness: Secondary | ICD-10-CM | POA: Diagnosis not present

## 2020-12-10 DIAGNOSIS — I2510 Atherosclerotic heart disease of native coronary artery without angina pectoris: Secondary | ICD-10-CM | POA: Diagnosis present

## 2020-12-10 DIAGNOSIS — I255 Ischemic cardiomyopathy: Secondary | ICD-10-CM | POA: Diagnosis present

## 2020-12-10 HISTORY — PX: LOWER EXTREMITY ANGIOGRAPHY: CATH118251

## 2020-12-10 LAB — BASIC METABOLIC PANEL
Anion gap: 14 (ref 5–15)
Anion gap: 9 (ref 5–15)
Anion gap: 5 (ref 5–15)
BUN: 22 mg/dL — ABNORMAL HIGH (ref 6–20)
BUN: 20 mg/dL (ref 6–20)
BUN: 19 mg/dL (ref 6–20)
CO2: 25 mmol/L (ref 22–32)
CO2: 28 mmol/L (ref 22–32)
CO2: 30 mmol/L (ref 22–32)
Calcium: 8.9 mg/dL (ref 8.9–10.3)
Calcium: 8.8 mg/dL — ABNORMAL LOW (ref 8.9–10.3)
Calcium: 8.5 mg/dL — ABNORMAL LOW (ref 8.9–10.3)
Chloride: 95 mmol/L — ABNORMAL LOW (ref 98–111)
Chloride: 100 mmol/L (ref 98–111)
Chloride: 102 mmol/L (ref 98–111)
Creatinine, Ser: 0.81 mg/dL (ref 0.44–1.00)
Creatinine, Ser: 0.65 mg/dL (ref 0.44–1.00)
Creatinine, Ser: 0.64 mg/dL (ref 0.44–1.00)
GFR, Estimated: >60 mL/min (ref >60)
GFR, Estimated: >60 mL/min (ref >60)
GFR, Estimated: >60 mL/min (ref >60)
Glucose, Bld: 453 mg/dL — ABNORMAL HIGH (ref 70–99)
Glucose, Bld: 343 mg/dL — ABNORMAL HIGH (ref 70–99)
Glucose, Bld: 320 mg/dL — ABNORMAL HIGH (ref 70–99)
Potassium: 3 mmol/L — ABNORMAL LOW (ref 3.5–5.1)
Potassium: 2.6 mmol/L — CL (ref 3.5–5.1)
Potassium: 3.1 mmol/L — ABNORMAL LOW (ref 3.5–5.1)
Sodium: 134 mmol/L — ABNORMAL LOW (ref 135–145)
Sodium: 137 mmol/L (ref 135–145)
Sodium: 137 mmol/L (ref 135–145)

## 2020-12-10 LAB — HEPARIN LEVEL (UNFRACTIONATED)
Heparin Unfractionated: <0.1 IU/mL — ABNORMAL LOW (ref 0.30–0.70)
Heparin Unfractionated: 0.16 IU/mL — ABNORMAL LOW (ref 0.30–0.70)

## 2020-12-10 LAB — HEMOGLOBIN A1C
Hgb A1c MFr Bld: 13.7 % — ABNORMAL HIGH (ref 4.8–5.6)
Mean Plasma Glucose: 346.49 mg/dL

## 2020-12-10 LAB — HIV ANTIBODY (ROUTINE TESTING W REFLEX): HIV Screen 4th Generation wRfx: NONREACTIVE

## 2020-12-10 LAB — CBC
HCT: 35.7 % — ABNORMAL LOW (ref 36.0–46.0)
Hemoglobin: 12.5 g/dL (ref 12.0–15.0)
MCH: 29.7 pg (ref 26.0–34.0)
MCHC: 35 g/dL (ref 30.0–36.0)
MCV: 84.8 fL (ref 80.0–100.0)
Platelets: 183 10*3/uL (ref 150–400)
RBC: 4.21 MIL/uL (ref 3.87–5.11)
RDW: 12.5 % (ref 11.5–15.5)
WBC: 4.5 10*3/uL (ref 4.0–10.5)
nRBC: 0 % (ref 0.0–0.2)

## 2020-12-10 LAB — RESP PANEL BY RT-PCR (FLU A&B, COVID) ARPGX2
Influenza A by PCR: NEGATIVE
Influenza B by PCR: NEGATIVE
SARS Coronavirus 2 by RT PCR: NEGATIVE

## 2020-12-10 LAB — CBG MONITORING, ED
Glucose-Capillary: 436 mg/dL — ABNORMAL HIGH (ref 70–99)
Glucose-Capillary: 386 mg/dL — ABNORMAL HIGH (ref 70–99)
Glucose-Capillary: 320 mg/dL — ABNORMAL HIGH (ref 70–99)

## 2020-12-10 LAB — GLUCOSE, CAPILLARY
Glucose-Capillary: 257 mg/dL — ABNORMAL HIGH (ref 70–99)
Glucose-Capillary: 242 mg/dL — ABNORMAL HIGH (ref 70–99)
Glucose-Capillary: 234 mg/dL — ABNORMAL HIGH (ref 70–99)
Glucose-Capillary: 429 mg/dL — ABNORMAL HIGH (ref 70–99)
Glucose-Capillary: 466 mg/dL — ABNORMAL HIGH (ref 70–99)
Glucose-Capillary: 455 mg/dL — ABNORMAL HIGH (ref 70–99)

## 2020-12-10 LAB — MAGNESIUM: Magnesium: 2 mg/dL (ref 1.7–2.4)

## 2020-12-10 SURGERY — LOWER EXTREMITY ANGIOGRAPHY
Anesthesia: Moderate Sedation | Laterality: Left

## 2020-12-10 MED ORDER — HEPARIN SODIUM (PORCINE) 1000 UNIT/ML IJ SOLN
INTRAMUSCULAR | Status: DC | PRN
  Administered 2020-12-10: 4000 [IU] via INTRAVENOUS

## 2020-12-10 MED ORDER — HEPARIN SODIUM (PORCINE) 1000 UNIT/ML IJ SOLN
INTRAMUSCULAR | Status: AC
  Filled 2020-12-10: qty 1

## 2020-12-10 MED ORDER — MIDAZOLAM HCL 2 MG/2ML IJ SOLN
INTRAMUSCULAR | Status: DC | PRN
  Administered 2020-12-10 (×3): 1 mg via INTRAVENOUS

## 2020-12-10 MED ORDER — CEFAZOLIN SODIUM-DEXTROSE 1-4 GM/50ML-% IV SOLN
INTRAVENOUS | Status: DC | PRN
  Administered 2020-12-10: 2 g via INTRAVENOUS

## 2020-12-10 MED ORDER — PERFLUTREN LIPID MICROSPHERE
1.0000 mL | INTRAVENOUS | Status: DC | PRN
Start: 2020-12-10 — End: 2020-12-11
  Administered 2020-12-10: 2 mL via INTRAVENOUS
  Filled 2020-12-10: qty 10

## 2020-12-10 MED ORDER — POTASSIUM CHLORIDE CRYS ER 20 MEQ PO TBCR
40.0000 meq | EXTENDED_RELEASE_TABLET | Freq: Once | ORAL | Status: AC
  Administered 2020-12-10: 40 meq via ORAL
  Filled 2020-12-10: qty 2

## 2020-12-10 MED ORDER — HEPARIN BOLUS VIA INFUSION
1500.0000 [IU] | Freq: Once | INTRAVENOUS | Status: AC
  Administered 2020-12-10: 1500 [IU] via INTRAVENOUS
  Filled 2020-12-10: qty 1500

## 2020-12-10 MED ORDER — ASPIRIN EC 81 MG PO TBEC
81.0000 mg | DELAYED_RELEASE_TABLET | Freq: Every day | ORAL | Status: DC
  Administered 2020-12-10 – 2020-12-16 (×7): 81 mg via ORAL
  Filled 2020-12-10 (×7): qty 1

## 2020-12-10 MED ORDER — CEFAZOLIN SODIUM-DEXTROSE 2-4 GM/100ML-% IV SOLN
INTRAVENOUS | Status: AC
  Filled 2020-12-10: qty 100

## 2020-12-10 MED ORDER — FENTANYL CITRATE PF 50 MCG/ML IJ SOSY
PREFILLED_SYRINGE | INTRAMUSCULAR | Status: AC
  Filled 2020-12-10: qty 1

## 2020-12-10 MED ORDER — POTASSIUM CHLORIDE 10 MEQ/100ML IV SOLN
10.0000 meq | INTRAVENOUS | Status: DC
  Filled 2020-12-10: qty 100

## 2020-12-10 MED ORDER — MIDAZOLAM HCL 5 MG/5ML IJ SOLN
INTRAMUSCULAR | Status: AC
  Filled 2020-12-10: qty 5

## 2020-12-10 MED ORDER — ATORVASTATIN CALCIUM 20 MG PO TABS
20.0000 mg | ORAL_TABLET | Freq: Every day | ORAL | Status: DC
  Administered 2020-12-10 – 2020-12-16 (×7): 20 mg via ORAL
  Filled 2020-12-10 (×8): qty 1

## 2020-12-10 MED ORDER — SODIUM CHLORIDE 0.9 % IV SOLN: INTRAVENOUS | Status: DC

## 2020-12-10 MED ORDER — POTASSIUM CHLORIDE CRYS ER 20 MEQ PO TBCR
40.0000 meq | EXTENDED_RELEASE_TABLET | Freq: Four times a day (QID) | ORAL | Status: DC
  Administered 2020-12-10: 40 meq via ORAL

## 2020-12-10 MED ORDER — CEFAZOLIN SODIUM-DEXTROSE 2-4 GM/100ML-% IV SOLN: 2.0000 g | INTRAVENOUS | Status: DC

## 2020-12-10 MED ORDER — FENTANYL CITRATE (PF) 100 MCG/2ML IJ SOLN
INTRAMUSCULAR | Status: DC | PRN
  Administered 2020-12-10 (×2): 50 ug via INTRAVENOUS

## 2020-12-10 MED ORDER — INSULIN ASPART 100 UNIT/ML IV SOLN
10.0000 [IU] | Freq: Once | INTRAVENOUS | Status: AC
  Administered 2020-12-10: 10 [IU] via INTRAVENOUS
  Filled 2020-12-10 (×2): qty 0.1

## 2020-12-10 MED ORDER — INSULIN ASPART 100 UNIT/ML IV SOLN
10.0000 [IU] | Freq: Once | INTRAVENOUS | Status: AC
  Administered 2020-12-10: 10 [IU] via INTRAVENOUS
  Filled 2020-12-10: qty 0.1

## 2020-12-10 MED ORDER — INSULIN REGULAR HUMAN 100 UNIT/ML IJ SOLN: 10.0000 [IU] | Freq: Once | INTRAMUSCULAR | Status: DC

## 2020-12-10 MED ORDER — INSULIN GLARGINE-YFGN 100 UNIT/ML ~~LOC~~ SOLN
20.0000 [IU] | Freq: Every day | SUBCUTANEOUS | Status: DC
  Filled 2020-12-10 (×2): qty 0.2

## 2020-12-10 SURGICAL SUPPLY — 25 items
BALLN LUTONIX 018 4X150X130 (BALLOONS) ×2
BALLN LUTONIX 018 4X220X130 (BALLOONS) ×2
BALLN LUTONIX 018 4X300X130 (BALLOONS) ×2
BALLN ULTRVRSE 018 2.5X150X150 (BALLOONS) ×2
BALLOON LUTONIX 018 4X150X130 (BALLOONS) ×1 IMPLANT
BALLOON LUTONIX 018 4X220X130 (BALLOONS) ×1 IMPLANT
BALLOON LUTONIX 018 4X300X130 (BALLOONS) ×1 IMPLANT
BALLOON ULTRVS 018 2.5X150X150 (BALLOONS) ×1 IMPLANT
CATH ANGIO 5F PIGTAIL 65CM (CATHETERS) ×2 IMPLANT
CATH BEACON 5 .038 100 VERT TP (CATHETERS) ×2 IMPLANT
CATH ROTAREX 135 6FR (CATHETERS) ×2 IMPLANT
COVER PROBE U/S 5X48 (MISCELLANEOUS) ×2 IMPLANT
DEVICE STARCLOSE SE CLOSURE (Vascular Products) ×2 IMPLANT
GAUZE SPONGE 4X4 12PLY STRL (GAUZE/BANDAGES/DRESSINGS) ×4 IMPLANT
GLIDEWIRE ADV .035X260CM (WIRE) ×2 IMPLANT
KIT ENCORE 26 ADVANTAGE (KITS) ×2 IMPLANT
PACK ANGIOGRAPHY (CUSTOM PROCEDURE TRAY) ×2 IMPLANT
SHEATH ANL2 6FRX45 HC (SHEATH) ×2 IMPLANT
SHEATH BRITE TIP 5FRX11 (SHEATH) ×2 IMPLANT
STENT VIABAHN 6X250X120 (Permanent Stent) ×2 IMPLANT
STENT VIABAHN 6X7.5X120 (Permanent Stent) ×2 IMPLANT
SYR MEDRAD MARK 7 150ML (SYRINGE) ×2 IMPLANT
TUBING CONTRAST HIGH PRESS 72 (TUBING) ×2 IMPLANT
WIRE G V18X300CM (WIRE) ×2 IMPLANT
WIRE GUIDERIGHT .035X150 (WIRE) ×2 IMPLANT

## 2020-12-10 NOTE — ED Notes (Signed)
This RN gave report to [facility] in [place].

## 2020-12-10 NOTE — Progress Notes (Signed)
ANTICOAGULATION CONSULT NOTE  Pharmacy Consult for heparin Indication:  PAD / limb ischemia  No Known Allergies  Patient Measurements: Height: 5\' 6"  (167.6 cm) Weight: 49.7 kg (109 lb 9.1 oz) IBW/kg (Calculated) : 59.3 Heparin Dosing Weight: 50 kg  Vital Signs: Temp: 97.7 F (36.5 C) (11/16 1132) Temp Source: Oral (11/16 0904) BP: 117/67 (11/16 1132) Pulse Rate: 68 (11/16 1132)  Labs: Recent Labs    12/09/20 1501 12/09/20 1815 12/09/20 2056 12/09/20 2337 12/10/20 0318 12/10/20 0442 12/10/20 0458  HGB 13.5  --   --   --  12.5  --   --   HCT 39.2  --   --   --  35.7*  --   --   PLT 230  --   --   --  183  --   --   APTT  --   --   --  78*  --   --   --   LABPROT  --   --   --  15.0  --   --   --   INR  --   --   --  1.2  --   --   --   HEPARINUNFRC  --   --   --   --   --  <0.10*  --   CREATININE 0.92  --  0.87  --  0.81  --  0.65  TROPONINIHS 28* 27*  --   --   --   --   --      Estimated Creatinine Clearance: 65.3 mL/min (by C-G formula based on SCr of 0.65 mg/dL).   Medical History: Past Medical History:  Diagnosis Date   Diabetes mellitus without complication ([place])    Hypertension      Assessment: 50 year old female presented with edema and dizziness. No anticoagulation noted PTA. Pharmacy consult for heparin management.  Goal of Therapy:  Heparin level 0.3-0.7 units/ml Monitor platelets by anticoagulation protocol: Yes  Date Time  HL Rate/Comment 11/16  0442  <0.1 Subthera; 600>750 un/hr    11/16  ~0900   -- Heparin held for procedure. 11/16  ~1230   -- Heparin resumed; 750 un/hr  Baseline Labs: aPTT - unk INR - 1.2 Hgb - 13.5>2.5 Plts - 230>183   Plan:  After Cath resumed heparin at prior rate, no bolus per Vasc Sx. Titrate by nomogram thereafter. Resume heparin infusion at 750 units/hr (1230 11/16). Recheck HL in 6 hours after restart (1900 11/16) CTM CBC daily while on heparin  Lorna Dibble, PharmD, [place]  Pharmacist 12/10/2020 12:34 PM

## 2020-12-10 NOTE — Plan of Care (Signed)

## 2020-12-10 NOTE — Progress Notes (Signed)
PROGRESS NOTE    Beth Carroll  VZD:[id number] DOB: July 09, 1970 DOA: 12/09/2020 PCP: Baxter Hire, MD    Chief Complaint  Patient presents with   Edema    Brief Narrative:    Beth Carroll is a 50 y.o. female with medical history significant for DM, HTN, bilateral lower extremity edema for the past 3 months with negative venous doppler on 10/26, and with little response to empiric Lasix, compression elevated who presents to the emergency room with a several day history of nausea, poor appetite and dizziness on standing.  She continues to take her Lasix as prescribed.  She also reports bilateral lower extremity pain with ambulation that improves with rest as well as rest pain.  She denies fever or chills.  Denies chest pain no shortness of breath or cough.  Denies abdominal pain.      ED course: On arrival BP 161/82 with otherwise normal vitals Blood work: Potassium 2.4, glucose 392, troponin 28-27, BNP 806   EKG, personally viewed and interpreted: Sinus at 76 with prolonged QTC at 531 and no acute ST-T wave changes   Chest x-ray with no acute findings   ABIs done in the ED 0.3, correlating with decreased capillary refill.  The ED provider contacted vascular surgeon Dr. Lucky Cowboy who was agreeable with heparin anticoagulation in preparation for angiogram in the a.m. Potassium repleted orally in the ED    Assessment & Plan:   Principal Problem:   Postural dizziness with presyncope Active Problems:   Hypertension   Rest pain of both lower extremities due to atherosclerosis (HCC)   Hyperglycemia due to type 2 diabetes mellitus (HCC)   Hypokalemia   Bilateral lower extremity edema   Severe peripheral arterial disease with rest pain -Her ABI in the critical range, she is with severe dependent edema. -Vascular surgery consult greatly appreciated, patient S/P   1.  Ultrasound guidance for vascular access right femoral artery             2.  Catheter placement into left common  femoral artery from right femoral approach             3.  Aortogram and selective left lower extremity angiogram             4.  Percutaneous transluminal angioplasty of left anterior tibial artery with 2.5 mm angioplasty balloon             5.  Percutaneous transluminal angioplasty of left posterior tibial artery with 2.5 mm diameter angioplasty balloon             6.  Mechanical thrombectomy to the left SFA and popliteal arteries with the Rota Rex device to debulk chronic thrombus             7.  Angioplasty of the left SFA and popliteal arteries with 4 mm diameter by 30 cm length and 4 mm diameter by 15 cm length Lutonix drug-coated angioplasty balloons             8.  Viabahn stent placement x2 to the left SFA and above-knee popliteal arteries with a 6 mm diameter by 25 cm length stent and 6 mm diameter by 7.5 cm length stent             9.  StarClose closure device right femoral artery  -Further management per vascular surgery, she remains on heparin GTT, plan for angiogram of right lower extremity with possible intervention, neurosurgery would like to wait for another 48  to 72 hours to clear the dye load before further intervention         Postural dizziness with presyncope -Suspect orthostatic hypotension from overdiuresis from Lasix in combination with poor oral intake - Follow on 2D echo Continue to monitor on telemetry    Hyperglycemia due to type 2 diabetes mellitus (HCC) - Sliding scale insulin coverage, resume home Lantus   Severe hypokalemia -Significantly low this morning at 2.6, repleted, will recheck this afternoon  Tobacco abuse -She was counseled.     Hypertension - Continue antihypertensives     DVT prophylaxis: heparin GTT Code Status: Full Family Communication: Husband and daughter at bedside Disposition:   Status is: Inpatient  Remains inpatient appropriate because: Vascular intervention, on heparin GTT       Consultants:  Vascular surgery    Subjective:  report left lower extremity pain has significantly improved, but she just received IV morphine Objective: Vitals:   12/10/20 1045 12/10/20 1100 12/10/20 1132 12/10/20 1522  BP: 122/82 120/62 117/67 119/74  Pulse: 71 64 68 76  Resp: 12 13 18 18   Temp:   97.7 F (36.5 C) 97.9 F (36.6 C)  TempSrc:    Oral  SpO2: 99% 95% 99% 98%  Weight:      Height:        Intake/Output Summary (Last 24 hours) at 12/10/2020 1928 Last data filed at 12/10/2020 1849 Gross per 24 hour  Intake --  Output 0 ml  Net 0 ml    Filed Weights   12/10/20 0244  Weight: 49.7 kg    Examination:  Awake Alert, Oriented X 3, No new F.N deficits, Normal affect Symmetrical Chest wall movement, Good air movement bilaterally, CTAB RRR,No Gallops,Rubs or new Murmurs, No Parasternal Heave +ve B.Sounds, Abd Soft, No tenderness, No rebound - guarding or rigidity. Patient with significant left lower extremity swelling and erythema.   Data Reviewed: I have personally reviewed following labs and imaging studies  CBC: Recent Labs  Lab 12/09/20 1501 12/10/20 0318  WBC 4.7 4.5  HGB 13.5 12.5  HCT 39.2 35.7*  MCV 84.1 84.8  PLT 230 183     Basic Metabolic Panel: Recent Labs  Lab 12/09/20 1501 12/09/20 2056 12/10/20 0318 12/10/20 0458 12/10/20 0509 12/10/20 1840  NA 132* 132* 134* 137  --  137  K 2.4* 3.0* 3.0* 2.6*  --  3.1*  CL 87* 93* 95* 100  --  102  CO2 23 23 25 28   --  30  GLUCOSE 392* 493* 453* 343*  --  320*  BUN 25* 23* 22* 20  --  19  CREATININE 0.92 0.87 0.81 0.65  --  0.64  CALCIUM 9.2 8.5* 8.9 8.8*  --  8.5*  MG  --   --   --   --  2.0  --      GFR: Estimated Creatinine Clearance: 65.3 mL/min (by C-G formula based on SCr of 0.64 mg/dL).  Liver Function Tests: Recent Labs  Lab 12/09/20 1501  AST 15  ALT 12  ALKPHOS 59  BILITOT 2.2*  PROT 7.4  ALBUMIN 3.9     CBG: Recent Labs  Lab 12/10/20 0404 12/10/20 0814 12/10/20 1046 12/10/20 1135  12/10/20 1709  GLUCAP 386* 320* 257* 242* 234*      Recent Results (from the past 240 hour(s))  Resp Panel by RT-PCR (Flu A&B, Covid) Nasopharyngeal Swab     Status: None   Collection Time: 12/10/20  8:20 AM   Specimen: Nasopharyngeal  Swab; Nasopharyngeal(NP) swabs in vial transport medium  Result Value Ref Range Status   SARS Coronavirus 2 by RT PCR NEGATIVE NEGATIVE Final    Comment: (NOTE) SARS-CoV-2 target nucleic acids are NOT DETECTED.  The SARS-CoV-2 RNA is generally detectable in upper respiratory specimens during the acute phase of infection. The lowest concentration of SARS-CoV-2 viral copies this assay can detect is 138 copies/mL. A negative result does not preclude SARS-Cov-2 infection and should not be used as the sole basis for treatment or other patient management decisions. A negative result may occur with  improper specimen collection/handling, submission of specimen other than nasopharyngeal swab, presence of viral mutation(s) within the areas targeted by this assay, and inadequate number of viral copies(<138 copies/mL). A negative result must be combined with clinical observations, patient history, and epidemiological information. The expected result is Negative.  Fact Sheet for Patients:  EntrepreneurPulse.com.au  Fact Sheet for Healthcare Providers:  IncredibleEmployment.be  This test is no t yet approved or cleared by the [place] FDA and  has been authorized for detection and/or diagnosis of SARS-CoV-2 by FDA under an Emergency Use Authorization (EUA). This EUA will remain  in effect (meaning this test can be used) for the duration of the COVID-19 declaration under Section 564(b)(1) of the Act, 21 U.S.C.section 360bbb-3(b)(1), unless the authorization is terminated  or revoked sooner.       Influenza A by PCR NEGATIVE NEGATIVE Final   Influenza B by PCR NEGATIVE NEGATIVE Final    Comment: (NOTE) The Xpert  Xpress SARS-CoV-2/FLU/RSV plus assay is intended as an aid in the diagnosis of influenza from Nasopharyngeal swab specimens and should not be used as a sole basis for treatment. Nasal washings and aspirates are unacceptable for Xpert Xpress SARS-CoV-2/FLU/RSV testing.  Fact Sheet for Patients: EntrepreneurPulse.com.au  Fact Sheet for Healthcare Providers: IncredibleEmployment.be  This test is not yet approved or cleared by the [place] FDA and has been authorized for detection and/or diagnosis of SARS-CoV-2 by FDA under an Emergency Use Authorization (EUA). This EUA will remain in effect (meaning this test can be used) for the duration of the COVID-19 declaration under Section 564(b)(1) of the Act, 21 U.S.C. section 360bbb-3(b)(1), unless the authorization is terminated or revoked.  Performed at [facility], [address] [place], [place] [address]           Radiology Studies: DG Chest 2 View  Result Date: 12/09/2020 CLINICAL DATA:  Bilateral lower extremity swelling. History high blood pressure and diabetes S. EXAM: CHEST - 2 VIEW COMPARISON:  None. FINDINGS: The cardiac silhouette, mediastinal and hilar contours are normal. The lungs are clear of an acute process. Mild hyperinflation. No pleural effusions or pulmonary lesions. The bony thorax is intact. IMPRESSION: No acute cardiopulmonary findings. Electronically Signed   By: Marijo Sanes M.D.   On: 12/09/2020 15:37   PERIPHERAL VASCULAR CATHETERIZATION  Result Date: 12/10/2020 See surgical note for result.       Scheduled Meds:  aspirin EC  81 mg Oral Daily   atorvastatin  20 mg Oral Daily   fentaNYL       fentaNYL       heparin sodium (porcine)       insulin aspart  0-15 Units Subcutaneous Q4H   insulin glargine-yfgn  20 Units Subcutaneous Daily   midazolam       potassium chloride  40 mEq Oral Q6H   Continuous Infusions:  ceFAZolin     heparin 750  Units/hr (12/10/20 1220)  LOS: 0 days        Phillips Climes, MD [facility]   To contact the attending provider between 7A-7P or the covering provider during after hours 7P-7A, please log into the web site [email or web address] and access using universal [place] password for that web site. If you do not have the password, please call the hospital operator.  12/10/2020, 7:28 PM

## 2020-12-10 NOTE — Progress Notes (Signed)
ANTICOAGULATION CONSULT NOTE  Pharmacy Consult for heparin Indication:  PAD / limb ischemia  No Known Allergies  Patient Measurements: Weight: 49.7 kg (109 lb 9.1 oz) Heparin Dosing Weight: 50 kg  Vital Signs: BP: 106/66 (11/16 0454) Pulse Rate: 73 (11/16 0454)  Labs: Recent Labs    12/09/20 1501 12/09/20 1815 12/09/20 2056 12/09/20 2337 12/10/20 0318 12/10/20 0442 12/10/20 0458  HGB 13.5  --   --   --  12.5  --   --   HCT 39.2  --   --   --  35.7*  --   --   PLT 230  --   --   --  183  --   --   APTT  --   --   --  78*  --   --   --   LABPROT  --   --   --  15.0  --   --   --   INR  --   --   --  1.2  --   --   --   HEPARINUNFRC  --   --   --   --   --  <0.10*  --   CREATININE 0.92  --  0.87  --  0.81  --  0.65  TROPONINIHS 28* 27*  --   --   --   --   --      CrCl cannot be calculated (Unknown ideal weight.).   Medical History: Past Medical History:  Diagnosis Date   Diabetes mellitus without complication ([place])    Hypertension      Assessment: 50 year old female presented with edema and dizziness. No anticoagulation noted PTA. Pharmacy consult for heparin management.  Goal of Therapy:  Heparin level 0.3-0.7 units/ml Monitor platelets by anticoagulation protocol: Yes  11/16 0442 HL < 0.1   Plan:  Most recent weight 50 kg per office visit ~ one week ago Heparin 1500 unit bolus x 1 Increase heparin infusion to 750 units/hr Recheck HL in 6 hours after rate change CBC daily while on heparin  Renda Rolls, PharmD, [facility] 12/10/2020 7:14 AM

## 2020-12-10 NOTE — ED Notes (Signed)
Pt resting comfortably at this time. Heparin titrated per order. Pt denies any needs. Husband at bedside. NAD noted.

## 2020-12-10 NOTE — H&P (Deleted)
PROGRESS NOTE    Beth Carroll  TSV:[id number] DOB: Oct 11, 1970 DOA: 12/09/2020 PCP: Baxter Hire, MD    Chief Complaint  Patient presents with   Edema    Brief Narrative:    Beth Carroll is a 50 y.o. female with medical history significant for DM, HTN, bilateral lower extremity edema for the past 3 months with negative venous doppler on 10/26, and with little response to empiric Lasix, compression elevated who presents to the emergency room with a several day history of nausea, poor appetite and dizziness on standing.  She continues to take her Lasix as prescribed.  She also reports bilateral lower extremity pain with ambulation that improves with rest as well as rest pain.  She denies fever or chills.  Denies chest pain no shortness of breath or cough.  Denies abdominal pain.      ED course: On arrival BP 161/82 with otherwise normal vitals Blood work: Potassium 2.4, glucose 392, troponin 28-27, BNP 806   EKG, personally viewed and interpreted: Sinus at 76 with prolonged QTC at 531 and no acute ST-T wave changes   Chest x-ray with no acute findings   ABIs done in the ED 0.3, correlating with decreased capillary refill.  The ED provider contacted vascular surgeon Dr. Lucky Cowboy who was agreeable with heparin anticoagulation in preparation for angiogram in the a.m. Potassium repleted orally in the ED    Assessment & Plan:   Principal Problem:   Postural dizziness with presyncope Active Problems:   Hypertension   Rest pain of both lower extremities due to atherosclerosis (HCC)   Hyperglycemia due to type 2 diabetes mellitus (HCC)   Hypokalemia   Bilateral lower extremity edema   Severe peripheral arterial disease with rest pain -Her ABI in the critical range, she is with severe dependent edema. -Vascular surgery consult greatly appreciated, patient S/P   1.  Ultrasound guidance for vascular access right femoral artery             2.  Catheter placement into left common  femoral artery from right femoral approach             3.  Aortogram and selective left lower extremity angiogram             4.  Percutaneous transluminal angioplasty of left anterior tibial artery with 2.5 mm angioplasty balloon             5.  Percutaneous transluminal angioplasty of left posterior tibial artery with 2.5 mm diameter angioplasty balloon             6.  Mechanical thrombectomy to the left SFA and popliteal arteries with the Rota Rex device to debulk chronic thrombus             7.  Angioplasty of the left SFA and popliteal arteries with 4 mm diameter by 30 cm length and 4 mm diameter by 15 cm length Lutonix drug-coated angioplasty balloons             8.  Viabahn stent placement x2 to the left SFA and above-knee popliteal arteries with a 6 mm diameter by 25 cm length stent and 6 mm diameter by 7.5 cm length stent             9.  StarClose closure device right femoral artery  -Further management per vascular surgery, she remains on heparin GTT, plan for angiogram of right lower extremity with possible intervention, neurosurgery would like to wait for another 48  to 72 hours to clear the dye load before further intervention         Postural dizziness with presyncope -Suspect orthostatic hypotension from overdiuresis from Lasix in combination with poor oral intake - Follow on 2D echo Continue to monitor on telemetry    Hyperglycemia due to type 2 diabetes mellitus (HCC) - Sliding scale insulin coverage, resume home Lantus   Severe hypokalemia -Significantly low this morning at 2.6, repleted, will recheck this afternoon  Tobacco abuse -She was counseled.     Hypertension - Continue antihypertensives     DVT prophylaxis: heparin GTT Code Status: Full Family Communication: Husband and daughter at bedside Disposition:   Status is: Inpatient  Remains inpatient appropriate because: Vascular intervention, on heparin GTT       Consultants:  Vascular surgery    Subjective:  report left lower extremity pain has significantly improved, but she just received IV morphine Objective: Vitals:   12/10/20 1045 12/10/20 1100 12/10/20 1132 12/10/20 1522  BP: 122/82 120/62 117/67 119/74  Pulse: 71 64 68 76  Resp: 12 13 18 18   Temp:   97.7 F (36.5 C) 97.9 F (36.6 C)  TempSrc:    Oral  SpO2: 99% 95% 99% 98%  Weight:      Height:        Intake/Output Summary (Last 24 hours) at 12/10/2020 1613 Last data filed at 12/10/2020 1522 Gross per 24 hour  Intake --  Output 0 ml  Net 0 ml   Filed Weights   12/10/20 0244  Weight: 49.7 kg    Examination:  Awake Alert, Oriented X 3, No new F.N deficits, Normal affect Symmetrical Chest wall movement, Good air movement bilaterally, CTAB RRR,No Gallops,Rubs or new Murmurs, No Parasternal Heave +ve B.Sounds, Abd Soft, No tenderness, No rebound - guarding or rigidity. Patient with significant left lower extremity swelling and erythema.   Data Reviewed: I have personally reviewed following labs and imaging studies  CBC: Recent Labs  Lab 12/09/20 1501 12/10/20 0318  WBC 4.7 4.5  HGB 13.5 12.5  HCT 39.2 35.7*  MCV 84.1 84.8  PLT 230 818    Basic Metabolic Panel: Recent Labs  Lab 12/09/20 1501 12/09/20 2056 12/10/20 0318 12/10/20 0458 12/10/20 0509  NA 132* 132* 134* 137  --   K 2.4* 3.0* 3.0* 2.6*  --   CL 87* 93* 95* 100  --   CO2 23 23 25 28   --   GLUCOSE 392* 493* 453* 343*  --   BUN 25* 23* 22* 20  --   CREATININE 0.92 0.87 0.81 0.65  --   CALCIUM 9.2 8.5* 8.9 8.8*  --   MG  --   --   --   --  2.0    GFR: Estimated Creatinine Clearance: 65.3 mL/min (by C-G formula based on SCr of 0.65 mg/dL).  Liver Function Tests: Recent Labs  Lab 12/09/20 1501  AST 15  ALT 12  ALKPHOS 59  BILITOT 2.2*  PROT 7.4  ALBUMIN 3.9    CBG: Recent Labs  Lab 12/10/20 0308 12/10/20 0404 12/10/20 0814 12/10/20 1046 12/10/20 1135  GLUCAP 436* 386* 320* 257* 242*     Recent  Results (from the past 240 hour(s))  Resp Panel by RT-PCR (Flu A&B, Covid) Nasopharyngeal Swab     Status: None   Collection Time: 12/10/20  8:20 AM   Specimen: Nasopharyngeal Swab; Nasopharyngeal(NP) swabs in vial transport medium  Result Value Ref Range Status   SARS Coronavirus 2  by RT PCR NEGATIVE NEGATIVE Final    Comment: (NOTE) SARS-CoV-2 target nucleic acids are NOT DETECTED.  The SARS-CoV-2 RNA is generally detectable in upper respiratory specimens during the acute phase of infection. The lowest concentration of SARS-CoV-2 viral copies this assay can detect is 138 copies/mL. A negative result does not preclude SARS-Cov-2 infection and should not be used as the sole basis for treatment or other patient management decisions. A negative result may occur with  improper specimen collection/handling, submission of specimen other than nasopharyngeal swab, presence of viral mutation(s) within the areas targeted by this assay, and inadequate number of viral copies(<138 copies/mL). A negative result must be combined with clinical observations, patient history, and epidemiological information. The expected result is Negative.  Fact Sheet for Patients:  EntrepreneurPulse.com.au  Fact Sheet for Healthcare Providers:  IncredibleEmployment.be  This test is no t yet approved or cleared by the [place] FDA and  has been authorized for detection and/or diagnosis of SARS-CoV-2 by FDA under an Emergency Use Authorization (EUA). This EUA will remain  in effect (meaning this test can be used) for the duration of the COVID-19 declaration under Section 564(b)(1) of the Act, 21 U.S.C.section 360bbb-3(b)(1), unless the authorization is terminated  or revoked sooner.       Influenza A by PCR NEGATIVE NEGATIVE Final   Influenza B by PCR NEGATIVE NEGATIVE Final    Comment: (NOTE) The Xpert Xpress SARS-CoV-2/FLU/RSV plus assay is intended as an aid in the  diagnosis of influenza from Nasopharyngeal swab specimens and should not be used as a sole basis for treatment. Nasal washings and aspirates are unacceptable for Xpert Xpress SARS-CoV-2/FLU/RSV testing.  Fact Sheet for Patients: EntrepreneurPulse.com.au  Fact Sheet for Healthcare Providers: IncredibleEmployment.be  This test is not yet approved or cleared by the [place] FDA and has been authorized for detection and/or diagnosis of SARS-CoV-2 by FDA under an Emergency Use Authorization (EUA). This EUA will remain in effect (meaning this test can be used) for the duration of the COVID-19 declaration under Section 564(b)(1) of the Act, 21 U.S.C. section 360bbb-3(b)(1), unless the authorization is terminated or revoked.  Performed at [facility], [address], [place], [place] [address]          Radiology Studies: DG Chest 2 View  Result Date: 12/09/2020 CLINICAL DATA:  Bilateral lower extremity swelling. History high blood pressure and diabetes S. EXAM: CHEST - 2 VIEW COMPARISON:  None. FINDINGS: The cardiac silhouette, mediastinal and hilar contours are normal. The lungs are clear of an acute process. Mild hyperinflation. No pleural effusions or pulmonary lesions. The bony thorax is intact. IMPRESSION: No acute cardiopulmonary findings. Electronically Signed   By: Marijo Sanes M.D.   On: 12/09/2020 15:37   PERIPHERAL VASCULAR CATHETERIZATION  Result Date: 12/10/2020 See surgical note for result.       Scheduled Meds:  aspirin EC  81 mg Oral Daily   atorvastatin  20 mg Oral Daily   fentaNYL       fentaNYL       heparin sodium (porcine)       insulin aspart  0-15 Units Subcutaneous Q4H   insulin glargine-yfgn  20 Units Subcutaneous Daily   midazolam       potassium chloride  40 mEq Oral Q6H   Continuous Infusions:  ceFAZolin     heparin 750 Units/hr (12/10/20 1220)     LOS: 0 days        Phillips Climes, MD [facility]  To contact the attending provider between 7A-7P or the covering provider during after hours 7P-7A, please log into the web site [email or web address] and access using universal [place] password for that web site. If you do not have the password, please call the hospital operator.  12/10/2020, 4:13 PM

## 2020-12-10 NOTE — H&P (View-Only) (Signed)
[facility] Vascular Consult Note  MRN : [id number]  Beth Carroll is a 50 y.o. (Dec 28, 1970) female who presents with chief complaint of  Chief Complaint  Patient presents with   Edema  .  History of Present Illness: I am asked to see the patient by Dr. Tamala Julian in the ER for evaluation of the patient with severe peripheral arterial disease.  She came in with lethargy and edema as primary complaints, but has been having severe leg pain for months now.  Both legs are affected.  The left leg is worse than the right.  She is a heavy smoker.  In the emergency department she had noninvasive studies performed showing a right ABI of 0.4 and a left ABI of 0.3 with very poor waveforms.  Given these findings, we are consulted for further evaluation and treatment.  She has been started on a heparin drip with minimal improvement.  Her legs are painful at rest and she does dangle them much of the time.  She does have severe pain in her legs with short distances of activity.  Both legs are affected, but the left leg may be slightly worse  Current Facility-Administered Medications  Medication Dose Route Frequency Provider Last Rate Last Admin   0.9 %  sodium chloride infusion   Intravenous Continuous Algernon Huxley, MD 20 mL/hr at 12/10/20 0913 New Bag at 12/10/20 0913   [MAR Hold] acetaminophen (TYLENOL) tablet 650 mg  650 mg Oral Q6H PRN Athena Masse, MD       Or   [facility] Hold] acetaminophen (TYLENOL) suppository 650 mg  650 mg Rectal Q6H PRN Athena Masse, MD       ceFAZolin (ANCEF) IVPB 2g/100 mL premix  2 g Intravenous 30 min Pre-Op Algernon Huxley, MD       heparin ADULT infusion 100 units/mL (25000 units/271mL)  750 Units/hr Intravenous Continuous Renda Rolls, RPH   Stopped at 12/10/20 0913   [MAR Hold] hydrALAZINE (APRESOLINE) injection 5 mg  5 mg Intravenous Q4H PRN Athena Masse, MD       [MAR Hold] HYDROcodone-acetaminophen (NORCO/VICODIN) 5-325 MG per tablet 1-2 tablet   1-2 tablet Oral Q4H PRN Athena Masse, MD       [MAR Hold] insulin aspart (novoLOG) injection 0-15 Units  0-15 Units Subcutaneous Q4H Athena Masse, MD   11 Units at 12/10/20 0850   [MAR Hold] insulin glargine-yfgn (SEMGLEE) injection 20 Units  20 Units Subcutaneous Daily Elgergawy, Silver Huguenin, MD       [facility] Hold] morphine 2 MG/ML injection 2 mg  2 mg Intravenous Q2H PRN Athena Masse, MD       [MAR Hold] ondansetron [facility]) tablet 4 mg  4 mg Oral Q6H PRN Athena Masse, MD       Or   Doug Sou Hold] ondansetron [facility]) injection 4 mg  4 mg Intravenous Q6H PRN Athena Masse, MD   4 mg at 12/10/20 0314   [facility] Hold] potassium chloride 10 mEq in 100 mL IVPB  10 mEq Intravenous Q1 Hr x 4 Elgergawy, Silver Huguenin, MD       [MAR Hold] potassium chloride SA (KLOR-CON) CR tablet 40 mEq  40 mEq Oral Q6H Elgergawy, Silver Huguenin, MD        Past Medical History:  Diagnosis Date   Diabetes mellitus without complication ([place])    Hypertension     History reviewed. No pertinent surgical history.   Social History The  patient denies smoking. The patient denies smokeless tobacco. The patient denies alcohol use. The patient denies drug use   Family History No bleeding disorders, clotting disorders, autoimmune diseases or aneurysms  No Known Allergies   REVIEW OF SYSTEMS (Negative unless checked)  Constitutional: [x] Weight loss  [] Fever  [] Chills Cardiac: [] Chest pain   [] Chest pressure   [] Palpitations   [] Shortness of breath when laying flat   [] Shortness of breath at rest   [] Shortness of breath with exertion. Vascular:  [] Pain in legs with walking   [] Pain in legs at rest   [] Pain in legs when laying flat   [] Claudication   [] Pain in feet when walking  [x] Pain in feet at rest  [x] Pain in feet when laying flat   [] History of DVT   [] Phlebitis   [x] Swelling in legs   [] Varicose veins   [] Non-healing ulcers Pulmonary:   [] Uses home oxygen   [] Productive cough   [] Hemoptysis   [] Wheeze  [] COPD    [] Asthma Neurologic:  [] Dizziness  [] Blackouts   [] Seizures   [] History of stroke   [] History of TIA  [] Aphasia   [] Temporary blindness   [] Dysphagia   [] Weakness or numbness in arms   [] Weakness or numbness in legs Musculoskeletal:  [] Arthritis   [] Joint swelling   [x] Joint pain   [] Low back pain Hematologic:  [] Easy bruising  [] Easy bleeding   [] Hypercoagulable state   [] Anemic  [] Hepatitis Gastrointestinal:  [] Blood in stool   [] Vomiting blood  [] Gastroesophageal reflux/heartburn   [] Difficulty swallowing. Genitourinary:  [] Chronic kidney disease   [] Difficult urination  [] Frequent urination  [] Burning with urination   [] Blood in urine Skin:  [] Rashes   [] Ulcers   [] Wounds Psychological:  [] History of anxiety   []  History of major depression.  Physical Examination  Vitals:   12/10/20 0244 12/10/20 0454 12/10/20 0748 12/10/20 0904  BP:  106/66 124/71 (!) 148/85  Pulse:  73 68 74  Resp:  18 18 17   Temp:    98.2 F (36.8 C)  TempSrc:    Oral  SpO2:  95% 95% 99%  Weight: 49.7 kg     Height:    5\' 6"  (1.676 m)   Body mass index is 17.68 kg/m. Gen: Frail, ill-appearing woman who looks much older than stated age Head:  [place]/AT, No temporalis wasting.  Ear/Nose/Throat: Hearing grossly intact, nares w/o erythema or drainage, oropharynx w/o Erythema/Exudate Eyes: Sclera non-icteric, conjunctiva clear Neck: Trachea midline.  No JVD.  Pulmonary:  Good air movement, respirations not labored, equal bilaterally.  Cardiac: somewhat irregular Vascular:  Vessel Right Left  Radial Palpable Palpable                          PT Not Palpable Not Palpable  DP Not Palpable Not Palpable   Gastrointestinal: soft, non-tender/non-distended. No guarding/reflex.  Musculoskeletal: M/S 5/5 throughout.  Sluggish capillary refill bilaterally.  1-2+ right lower extremity edema, 2-3+ left lower extremity edema Neurologic: Sensation grossly intact in extremities.  Symmetrical.  Speech is fluent. Motor exam  as listed above. Psychiatric: Judgment intact, Mood & affect appropriate for pt's clinical situation. Dermatologic: No rashes or ulcers noted.  No cellulitis or open wounds.      CBC Lab Results  Component Value Date   WBC 4.5 12/10/2020   HGB 12.5 12/10/2020   HCT 35.7 (L) 12/10/2020   MCV 84.8 12/10/2020   PLT 183 12/10/2020    BMET    Component Value Date/Time  NA 137 12/10/2020 0458   K 2.6 (LL) 12/10/2020 0458   CL 100 12/10/2020 0458   CO2 28 12/10/2020 0458   GLUCOSE 343 (H) 12/10/2020 0458   BUN 20 12/10/2020 0458   CREATININE 0.65 12/10/2020 0458   CALCIUM 8.8 (L) 12/10/2020 0458   GFRNONAA >60 12/10/2020 0458   Estimated Creatinine Clearance: 65.3 mL/min (by C-G formula based on SCr of 0.65 mg/dL).  COAG Lab Results  Component Value Date   INR 1.2 12/09/2020    Radiology DG Chest 2 View  Result Date: 12/09/2020 CLINICAL DATA:  Bilateral lower extremity swelling. History high blood pressure and diabetes S. EXAM: CHEST - 2 VIEW COMPARISON:  None. FINDINGS: The cardiac silhouette, mediastinal and hilar contours are normal. The lungs are clear of an acute process. Mild hyperinflation. No pleural effusions or pulmonary lesions. The bony thorax is intact. IMPRESSION: No acute cardiopulmonary findings. Electronically Signed   By: Marijo Sanes M.D.   On: 12/09/2020 15:37   US Abdomen Complete  Result Date: 11/10/2020 CLINICAL DATA:  Right upper quadrant pain EXAM: ABDOMEN ULTRASOUND COMPLETE COMPARISON:  None. FINDINGS: Gallbladder: Surgically absent Common bile duct: Diameter: 5.1 mm Liver: No focal lesion identified. Within normal limits in parenchymal echogenicity. Portal vein is patent on color Doppler imaging with normal direction of blood flow towards the liver. IVC: No abnormality visualized. Pancreas: Visualized portion unremarkable. Spleen: Size and appearance within normal limits. Right Kidney: Length: 13.4 cm. Echogenicity within normal limits. No mass  or hydronephrosis visualized. Left Kidney: Length: 12.8 cm. Echogenicity within normal limits. No mass or hydronephrosis visualized. Abdominal aorta: No aneurysm visualized. Other findings: None. IMPRESSION: Status post cholecystectomy. Otherwise negative abdominal ultrasound Electronically Signed   By: Donavan Foil M.D.   On: 11/10/2020 16:39      Assessment/Plan 1.  Severe peripheral arterial disease with rest pain.  ABIs are in the critical range.  Has severe dependent edema and part of that may be keeping her legs dependent due to the poor arterial perfusion but other issues and systemic medical issues are likely playing a role as well.  We are proceeding with angiogram of the left lower extremity today.  Depending on the severity of the disease and what is encountered, concomitant revascularization may be able to be performed but given her severe level of malperfusion, it is possible she is going to require some open surgical therapy as well.  She will also have to have her right leg evaluated and intervened on at some point in the near future.  Very difficult situation with a very high risk of limb loss 2.  Diabetes.  Very high sugars on admission and blood glucose control important in reducing the progression of atherosclerotic disease. Also, involved in wound healing. On appropriate medications. 3. Hypertension. Stable on outpatient medications and blood pressure control important in reducing the progression of atherosclerotic disease. On appropriate oral medications.    Leotis Pain, MD  12/10/2020 9:15 AM    This note was created with Dragon medical transcription system.  Any error is purely unintentional

## 2020-12-10 NOTE — Progress Notes (Addendum)
Pt potassium is 3.1. MD Bridgett Larsson made aware. Will continue to monitor.  Update  2016: MD chen ordered potassium chloride SA (KLOR-CON) CR tablet 40 mEq oral once. Will continue to monitor.  Update 2040: Pt blood sugar at 466. Pt is on sliding scale. MD Bridgett Larsson made aware. Will continue to monitor.  Update 2145: MD ordered insulin regular (Novolin R) 100 units/ml injection 10 units IV once and hold the sliding scale at this time. Will continue to monitor. Per MD recheck after 1 hour. Insulin 10 units IV given at 2135. Will continue to monitor.  Update 0237: Pt blood sugar at 455. MD Bridgett Larsson made aware. Will continue to monitor.  Update 2241: MD Bridgett Larsson ordered insulin aspart (novolog )IV injection 10 units once. Will continue to monitor.  Update 0052: Pt blood sugar at 471 MD chen made aware and ordered to give the 15 units  of insulin aspart (novolog ) on pt sliding scale at this time. Insulin administered at 0058. Will continue to monitor.  Update 0241: Pt blood sugar at 361. MD Bridgett Larsson made aware. Will continue to monitor.  Update 0246: No new order. Will continue to monitor.  Update 0418: Pt blood sugar at 269. MD Bridgett Larsson made aware. Will continue to monitor.

## 2020-12-10 NOTE — Progress Notes (Signed)
Inpatient Diabetes Program Recommendations  AACE/ADA: New Consensus Statement on Inpatient Glycemic Control (2015)  Target Ranges:  Prepandial:   less than 140 mg/dL      Peak postprandial:   less than 180 mg/dL (1-2 hours)      Critically ill patients:  140 - 180 mg/dL   Lab Results  Component Value Date   GLUCAP 320 (H) 12/10/2020   HGBA1C 13.7 (H) 12/10/2020    Review of Glycemic Control Results for Beth Carroll, Beth Carroll (MRN [id number]) as of 12/10/2020 10:36  Ref. Range 12/10/2020 03:08 12/10/2020 04:04 12/10/2020 08:14  Glucose-Capillary Latest Ref Range: 70 - 99 mg/dL 436 (H) 386 (H) 320 (H)   Diabetes history: DM2 Outpatient Diabetes medications: Lantus 25 units Current orders for Inpatient glycemic control: Semglee 20 units, Novolog 0-15 units q 4 hrs.  Inpatient Diabetes Program Recommendations:   Patient will need to receive Semglee postoperatively. Patient had office visit with Dr. Harrel Lemon on 12/03/20 and A1c on 10/16/20 @ [facility] was 14.0 and currently A1c is 13.7. Patient is currently in cath lab for angiogram of the left lower extremity.  Will follow postop during hospitalization.  Thank you, Nani Gasser. Leonel Mccollum, RN, MSN, CDE  Diabetes Coordinator Inpatient Glycemic Control Team Team Pager [phone] (8am-5pm) 12/10/2020 10:43 AM

## 2020-12-10 NOTE — Op Note (Signed)
[facility]  Percutaneous Study/Intervention Procedural Note   Date of Surgery: 12/10/2020  Surgeon(s):Kenyon Eichelberger    Assistants:none  Pre-operative Diagnosis: PAD with rest pain left lower extremity  Post-operative diagnosis:  Same  Procedure(s) Performed:             1.  Ultrasound guidance for vascular access right femoral artery             2.  Catheter placement into left common femoral artery from right femoral approach             3.  Aortogram and selective left lower extremity angiogram             4.  Percutaneous transluminal angioplasty of left anterior tibial artery with 2.5 mm angioplasty balloon             5.  Percutaneous transluminal angioplasty of left posterior tibial artery with 2.5 mm diameter angioplasty balloon  6.  Mechanical thrombectomy to the left SFA and popliteal arteries with the [place] Rex device to debulk chronic thrombus  7.  Angioplasty of the left SFA and popliteal arteries with 4 mm diameter by 30 cm length and 4 mm diameter by 15 cm length Lutonix drug-coated angioplasty balloons  8.  Viabahn stent placement x2 to the left SFA and above-knee popliteal arteries with a 6 mm diameter by 25 cm length stent and 6 mm diameter by 7.5 cm length stent             9.  StarClose closure device right femoral artery  EBL: 3 cc  Contrast: 60 cc  Fluoro Time: 7.2 minutes  Moderate Conscious Sedation Time: approximately 49 minutes using 3 mg of Versed and 100 mcg of Fentanyl              Indications:  Patient is a 50 y.o.female with swelling and rest pain in both lower extremities worse on the left than the right. The patient has noninvasive study showing markedly reduced ABIs bilaterally worse on the left than the right. The patient is brought in for angiography for further evaluation and potential treatment.  Due to the limb threatening nature of the situation, angiogram was performed for attempted limb salvage. The patient is aware that  if the procedure fails, amputation would be expected.  The patient also understands that even with successful revascularization, amputation may still be required due to the severity of the situation.  Risks and benefits are discussed and informed consent is obtained.   Procedure:  The patient was identified and appropriate procedural time out was performed.  The patient was then placed supine on the table and prepped and draped in the usual sterile fashion. Moderate conscious sedation was administered during a face to face encounter with the patient throughout the procedure with my supervision of the RN administering medicines and monitoring the patient's vital signs, pulse oximetry, telemetry and mental status throughout from the start of the procedure until the patient was taken to the recovery room. Ultrasound was used to evaluate the right common femoral artery.  It was patent .  A digital ultrasound image was acquired.  A Seldinger needle was used to access the right common femoral artery under direct ultrasound guidance and a permanent image was performed.  A 0.035 J wire was advanced without resistance and a 5Fr sheath was placed.  Pigtail catheter was placed into the aorta and an AP aortogram was performed. This demonstrated normal aorta and iliac segments without significant  stenosis.  The right renal artery was large and patent.  There was a left renal artery which came off in the mid to distal aorta which was large and patent.  There appeared to be a polar left renal artery superiorly that was not well seen due to catheter position.  I then crossed the aortic bifurcation and advanced to the left femoral head. Selective left lower extremity angiogram was then performed. This demonstrated fairly normal common femoral artery, profunda femoris artery, but a heavily diseased superficial femoral artery with multiple areas of high-grade greater than 80% stenosis in short segment occlusions down to the  above-knee popliteal artery where there was a short segment occlusion and reconstituted just above the knee.  The below-knee popliteal artery was small but did not have significant disease.  There was then a typical tibial trifurcation with moderate to high-grade stenosis in the 70 to 80% range in both the anterior tibial and posterior tibial arteries in the proximal segment which were the dominant runoff distally.  The peroneal artery was small without obvious focal stenosis. It was felt that it was in the patient's best interest to proceed with intervention after these images to avoid a second procedure and a larger amount of contrast and fluoroscopy based off of the findings from the initial angiogram. The patient was systemically heparinized and a 6 [place] Ansell sheath was then placed over the [employer] wire. I then used a Kumpe catheter and the advantage wire to navigate through the SFA and popliteal stenoses and occlusions without difficulty and get down into the below-knee popliteal artery.  I then exchanged for a V 18 wire and first got to the anterior tibial artery with a Kumpe catheter and cross the proximal stenosis without difficulty parking the wire down at the ankle.  I then used the [place] Rex device to perform mechanical thrombectomy to the left SFA and popliteal arteries to debulk chronic thrombus.  2 passes were made with the [place] Rex device clearing a channel but there remained multiple areas of high-grade stenosis.  I then proceeded with angioplasty first in the tibial vessels.  A 2.5 mm diameter by 15 cm length angioplasty balloon was inflated in the proximal anterior tibial artery and taken up to 10 atm for 1 minute.  Completion imaging showed less than 20% residual stenosis with some spasm below the balloon site.  The area treated was markedly improved.  I then used a Kumpe catheter and the V 18 wire to get into the posterior tibial artery and cross the proximal stenosis in the posterior  tibial artery.  The same 2.5 mm diameter by 15 cm length angioplasty balloon was inflated to 8 atm in the posterior tibial artery with again less than 20% residual stenosis and some spasm seen distal to the balloon site.  I then turned my attention to the SFA and popliteal lesions.  A 4 mm diameter by 30 cm length Lutonix drug-coated angioplasty balloon was inflated from the mid popliteal artery up to the proximal SFA and taken to 10 atm for 1 minute.  The proximal SFA was then treated with 4 mm diameter by 15 cm length Lutonix drug-coated angioplasty balloon also taken to 10 atm for 1 minute.  Completion imaging showed multiple areas of greater than 50% residual stenosis throughout the SFA and above-knee popliteal artery.  A 6 mm diameter by 25 cm length Viabahn stent and a 6 mm diameter by 7.5 cm length Viabahn stent were then deployed and postdilated  with 4 mm balloons with excellent angiographic completion result and less than 10% residual stenosis. I elected to terminate the procedure. The sheath was removed and StarClose closure device was deployed in the right femoral artery with excellent hemostatic result. The patient was taken to the recovery room in stable condition having tolerated the procedure well.  Findings:               Aortogram: The aorta and iliac arteries had no significant stenosis.  The right renal artery is widely patent.  There appeared to be a dominant and patent left renal artery coming off of the mid to distal aorta and a smaller polar superior left renal artery that was not well seen due to catheter position but did not appear to have high-grade stenosis.             Left lower Extremity: Fairly normal common femoral artery, profunda femoris artery, but a heavily diseased superficial femoral artery with multiple areas of high-grade greater than 80% stenosis in short segment occlusions down to the above-knee popliteal artery where there was a short segment occlusion and reconstituted  just above the knee.  The below-knee popliteal artery was small but did not have significant disease.  There was then a typical tibial trifurcation with moderate to high-grade stenosis in the 70 to 80% range in both the anterior tibial and posterior tibial arteries in the proximal segment which were the dominant runoff distally.  The peroneal artery was small without obvious focal stenosis.   Disposition: Patient was taken to the recovery room in stable condition having tolerated the procedure well.  Complications: None  Leotis Pain 12/10/2020 10:35 AM   This note was created with Dragon Medical transcription system. Any errors in dictation are purely unintentional.

## 2020-12-10 NOTE — Progress Notes (Signed)
ANTICOAGULATION CONSULT NOTE  Pharmacy Consult for heparin Indication:  PAD / limb ischemia  No Known Allergies  Patient Measurements: Height: 5\' 6"  (167.6 cm) Weight: 49.7 kg (109 lb 9.1 oz) IBW/kg (Calculated) : 59.3 Heparin Dosing Weight: 50 kg  Vital Signs: Temp: 97.9 F (36.6 C) (11/16 1522) Temp Source: Oral (11/16 1522) BP: 119/74 (11/16 1522) Pulse Rate: 76 (11/16 1522)  Labs: Recent Labs    12/09/20 1501 12/09/20 1815 12/09/20 2056 12/09/20 2337 12/10/20 0318 12/10/20 0442 12/10/20 0458 12/10/20 1840  HGB 13.5  --   --   --  12.5  --   --   --   HCT 39.2  --   --   --  35.7*  --   --   --   PLT 230  --   --   --  183  --   --   --   APTT  --   --   --  78*  --   --   --   --   LABPROT  --   --   --  15.0  --   --   --   --   INR  --   --   --  1.2  --   --   --   --   HEPARINUNFRC  --   --   --   --   --  <0.10*  --  0.16*  CREATININE 0.92  --    < >  --  0.81  --  0.65 0.64  TROPONINIHS 28* 27*  --   --   --   --   --   --    < > = values in this interval not displayed.     Estimated Creatinine Clearance: 65.3 mL/min (by C-G formula based on SCr of 0.64 mg/dL).   Medical History: Past Medical History:  Diagnosis Date   Diabetes mellitus without complication ([place])    Hypertension      Assessment: 50 year old female presented with edema and dizziness. No anticoagulation noted PTA. Pharmacy consult for heparin management.  Goal of Therapy:  Heparin level 0.3-0.7 units/ml Monitor platelets by anticoagulation protocol: Yes  Date Time  HL Rate/Comment 11/16  0442  <0.1 Subthera; 600>750 un/hr    11/16  ~0900   -- Heparin held for procedure. 11/16  ~1230   -- Heparin resumed; 750 un/hr 11/16  ~1840   0.16  Heparin increased; 900 units/hr    Plan:  Heparin level is subtherapeutic Will give heparin bolus of 1500 units x 1 and increase heparin infusion to 900 units/hr. Recheck heparin level in 6 hours. CBC daily while on heparin.   Eleonore Chiquito,  PharmD Clinical Pharmacist 12/10/2020 7:36 PM

## 2020-12-10 NOTE — Consult Note (Signed)
[facility] Consult Note  MRN : [id number]  Beth Carroll is a 50 y.o. (September 10, 1970) female who presents with chief complaint of  Chief Complaint  Patient presents with   Edema  .  History of Present Illness: I am asked to see the patient by Dr. Tamala Julian in the ER for evaluation of the patient with severe peripheral arterial disease.  She came in with lethargy and edema as primary complaints, but has been having severe leg pain for months now.  Both legs are affected.  The left leg is worse than the right.  She is a heavy smoker.  In the emergency department she had noninvasive studies performed showing a right ABI of 0.4 and a left ABI of 0.3 with very poor waveforms.  Given these findings, we are consulted for further evaluation and treatment.  She has been started on a heparin drip with minimal improvement.  Her legs are painful at rest and she does dangle them much of the time.  She does have severe pain in her legs with short distances of activity.  Both legs are affected, but the left leg may be slightly worse  Current Facility-Administered Medications  Medication Dose Route Frequency Provider Last Rate Last Admin   0.9 %  sodium chloride infusion   Intravenous Continuous Algernon Huxley, MD 20 mL/hr at 12/10/20 0913 New Bag at 12/10/20 0913   [MAR Hold] acetaminophen (TYLENOL) tablet 650 mg  650 mg Oral Q6H PRN Athena Masse, MD       Or   [facility] Hold] acetaminophen (TYLENOL) suppository 650 mg  650 mg Rectal Q6H PRN Athena Masse, MD       ceFAZolin (ANCEF) IVPB 2g/100 mL premix  2 g Intravenous 30 min Pre-Op Algernon Huxley, MD       heparin ADULT infusion 100 units/mL (25000 units/26mL)  750 Units/hr Intravenous Continuous Renda Rolls, RPH   Stopped at 12/10/20 0913   [MAR Hold] hydrALAZINE (APRESOLINE) injection 5 mg  5 mg Intravenous Q4H PRN Athena Masse, MD       [MAR Hold] HYDROcodone-acetaminophen (NORCO/VICODIN) 5-325 MG per tablet 1-2 tablet   1-2 tablet Oral Q4H PRN Athena Masse, MD       [MAR Hold] insulin aspart (novoLOG) injection 0-15 Units  0-15 Units Subcutaneous Q4H Athena Masse, MD   11 Units at 12/10/20 0850   [MAR Hold] insulin glargine-yfgn (SEMGLEE) injection 20 Units  20 Units Subcutaneous Daily Elgergawy, Silver Huguenin, MD       [facility] Hold] morphine 2 MG/ML injection 2 mg  2 mg Intravenous Q2H PRN Athena Masse, MD       [MAR Hold] ondansetron [facility]) tablet 4 mg  4 mg Oral Q6H PRN Athena Masse, MD       Or   Doug Sou Hold] ondansetron [facility]) injection 4 mg  4 mg Intravenous Q6H PRN Athena Masse, MD   4 mg at 12/10/20 0314   [facility] Hold] potassium chloride 10 mEq in 100 mL IVPB  10 mEq Intravenous Q1 Hr x 4 Elgergawy, Silver Huguenin, MD       [MAR Hold] potassium chloride SA (KLOR-CON) CR tablet 40 mEq  40 mEq Oral Q6H Elgergawy, Silver Huguenin, MD        Past Medical History:  Diagnosis Date   Diabetes mellitus without complication ([place])    Hypertension     History reviewed. No pertinent surgical history.   Social History The  patient denies smoking. The patient denies smokeless tobacco. The patient denies alcohol use. The patient denies drug use   Family History No bleeding disorders, clotting disorders, autoimmune diseases or aneurysms  No Known Allergies   REVIEW OF SYSTEMS (Negative unless checked)  Constitutional: [x] Weight loss  [] Fever  [] Chills Cardiac: [] Chest pain   [] Chest pressure   [] Palpitations   [] Shortness of breath when laying flat   [] Shortness of breath at rest   [] Shortness of breath with exertion. Vascular:  [] Pain in legs with walking   [] Pain in legs at rest   [] Pain in legs when laying flat   [] Claudication   [] Pain in feet when walking  [x] Pain in feet at rest  [x] Pain in feet when laying flat   [] History of DVT   [] Phlebitis   [x] Swelling in legs   [] Varicose veins   [] Non-healing ulcers Pulmonary:   [] Uses home oxygen   [] Productive cough   [] Hemoptysis   [] Wheeze  [] COPD    [] Asthma Neurologic:  [] Dizziness  [] Blackouts   [] Seizures   [] History of stroke   [] History of TIA  [] Aphasia   [] Temporary blindness   [] Dysphagia   [] Weakness or numbness in arms   [] Weakness or numbness in legs Musculoskeletal:  [] Arthritis   [] Joint swelling   [x] Joint pain   [] Low back pain Hematologic:  [] Easy bruising  [] Easy bleeding   [] Hypercoagulable state   [] Anemic  [] Hepatitis Gastrointestinal:  [] Blood in stool   [] Vomiting blood  [] Gastroesophageal reflux/heartburn   [] Difficulty swallowing. Genitourinary:  [] Chronic kidney disease   [] Difficult urination  [] Frequent urination  [] Burning with urination   [] Blood in urine Skin:  [] Rashes   [] Ulcers   [] Wounds Psychological:  [] History of anxiety   []  History of major depression.  Physical Examination  Vitals:   12/10/20 0244 12/10/20 0454 12/10/20 0748 12/10/20 0904  BP:  106/66 124/71 (!) 148/85  Pulse:  73 68 74  Resp:  18 18 17   Temp:    98.2 F (36.8 C)  TempSrc:    Oral  SpO2:  95% 95% 99%  Weight: 49.7 kg     Height:    5\' 6"  (1.676 m)   Body mass index is 17.68 kg/m. Gen: Frail, ill-appearing woman who looks much older than stated age Head: [place]/AT, No temporalis wasting.  Ear/Nose/Throat: Hearing grossly intact, nares w/o erythema or drainage, oropharynx w/o Erythema/Exudate Eyes: Sclera non-icteric, conjunctiva clear Neck: Trachea midline.  No JVD.  Pulmonary:  Good air movement, respirations not labored, equal bilaterally.  Cardiac: somewhat irregular Vascular:  Vessel Right Left  Radial Palpable Palpable                          PT Not Palpable Not Palpable  DP Not Palpable Not Palpable   Gastrointestinal: soft, non-tender/non-distended. No guarding/reflex.  Musculoskeletal: M/S 5/5 throughout.  Sluggish capillary refill bilaterally.  1-2+ right lower extremity edema, 2-3+ left lower extremity edema Neurologic: Sensation grossly intact in extremities.  Symmetrical.  Speech is fluent. Motor exam  as listed above. Psychiatric: Judgment intact, Mood & affect appropriate for pt's clinical situation. Dermatologic: No rashes or ulcers noted.  No cellulitis or open wounds.      CBC Lab Results  Component Value Date   WBC 4.5 12/10/2020   HGB 12.5 12/10/2020   HCT 35.7 (L) 12/10/2020   MCV 84.8 12/10/2020   PLT 183 12/10/2020    BMET    Component Value Date/Time  NA 137 12/10/2020 0458   K 2.6 (LL) 12/10/2020 0458   CL 100 12/10/2020 0458   CO2 28 12/10/2020 0458   GLUCOSE 343 (H) 12/10/2020 0458   BUN 20 12/10/2020 0458   CREATININE 0.65 12/10/2020 0458   CALCIUM 8.8 (L) 12/10/2020 0458   GFRNONAA >60 12/10/2020 0458   Estimated Creatinine Clearance: 65.3 mL/min (by C-G formula based on SCr of 0.65 mg/dL).  COAG Lab Results  Component Value Date   INR 1.2 12/09/2020    Radiology DG Chest 2 View  Result Date: 12/09/2020 CLINICAL DATA:  Bilateral lower extremity swelling. History high blood pressure and diabetes S. EXAM: CHEST - 2 VIEW COMPARISON:  None. FINDINGS: The cardiac silhouette, mediastinal and hilar contours are normal. The lungs are clear of an acute process. Mild hyperinflation. No pleural effusions or pulmonary lesions. The bony thorax is intact. IMPRESSION: No acute cardiopulmonary findings. Electronically Signed   By: Marijo Sanes M.D.   On: 12/09/2020 15:37   US Abdomen Complete  Result Date: 11/10/2020 CLINICAL DATA:  Right upper quadrant pain EXAM: ABDOMEN ULTRASOUND COMPLETE COMPARISON:  None. FINDINGS: Gallbladder: Surgically absent Common bile duct: Diameter: 5.1 mm Liver: No focal lesion identified. Within normal limits in parenchymal echogenicity. Portal vein is patent on color Doppler imaging with normal direction of blood flow towards the liver. IVC: No abnormality visualized. Pancreas: Visualized portion unremarkable. Spleen: Size and appearance within normal limits. Right Kidney: Length: 13.4 cm. Echogenicity within normal limits. No mass  or hydronephrosis visualized. Left Kidney: Length: 12.8 cm. Echogenicity within normal limits. No mass or hydronephrosis visualized. Abdominal aorta: No aneurysm visualized. Other findings: None. IMPRESSION: Status post cholecystectomy. Otherwise negative abdominal ultrasound Electronically Signed   By: Donavan Foil M.D.   On: 11/10/2020 16:39      Assessment/Plan 1.  Severe peripheral arterial disease with rest pain.  ABIs are in the critical range.  Has severe dependent edema and part of that may be keeping her legs dependent due to the poor arterial perfusion but other issues and systemic medical issues are likely playing a role as well.  We are proceeding with angiogram of the left lower extremity today.  Depending on the severity of the disease and what is encountered, concomitant revascularization may be able to be performed but given her severe level of malperfusion, it is possible she is going to require some open surgical therapy as well.  She will also have to have her right leg evaluated and intervened on at some point in the near future.  Very difficult situation with a very high risk of limb loss 2.  Diabetes.  Very high sugars on admission and blood glucose control important in reducing the progression of atherosclerotic disease. Also, involved in wound healing. On appropriate medications. 3. Hypertension. Stable on outpatient medications and blood pressure control important in reducing the progression of atherosclerotic disease. On appropriate oral medications.    Leotis Pain, MD  12/10/2020 9:15 AM    This note was created with Dragon medical transcription system.  Any error is purely unintentional

## 2020-12-11 ENCOUNTER — Encounter: Payer: Self-pay | Admitting: Internal Medicine

## 2020-12-11 DIAGNOSIS — I998 Other disorder of circulatory system: Secondary | ICD-10-CM | POA: Diagnosis not present

## 2020-12-11 DIAGNOSIS — R55 Syncope and collapse: Secondary | ICD-10-CM

## 2020-12-11 DIAGNOSIS — I70223 Atherosclerosis of native arteries of extremities with rest pain, bilateral legs: Secondary | ICD-10-CM | POA: Diagnosis not present

## 2020-12-11 DIAGNOSIS — R42 Dizziness and giddiness: Secondary | ICD-10-CM | POA: Diagnosis not present

## 2020-12-11 DIAGNOSIS — E876 Hypokalemia: Secondary | ICD-10-CM | POA: Diagnosis not present

## 2020-12-11 DIAGNOSIS — I25118 Atherosclerotic heart disease of native coronary artery with other forms of angina pectoris: Secondary | ICD-10-CM | POA: Diagnosis not present

## 2020-12-11 DIAGNOSIS — R600 Localized edema: Secondary | ICD-10-CM | POA: Diagnosis not present

## 2020-12-11 DIAGNOSIS — I255 Ischemic cardiomyopathy: Secondary | ICD-10-CM

## 2020-12-11 DIAGNOSIS — I10 Essential (primary) hypertension: Secondary | ICD-10-CM | POA: Diagnosis not present

## 2020-12-11 DIAGNOSIS — Z794 Long term (current) use of insulin: Secondary | ICD-10-CM

## 2020-12-11 LAB — CBC
HCT: 31.5 % — ABNORMAL LOW (ref 36.0–46.0)
Hemoglobin: 10.9 g/dL — ABNORMAL LOW (ref 12.0–15.0)
MCH: 29.7 pg (ref 26.0–34.0)
MCHC: 34.6 g/dL (ref 30.0–36.0)
MCV: 85.8 fL (ref 80.0–100.0)
Platelets: 145 10*3/uL — ABNORMAL LOW (ref 150–400)
RBC: 3.67 MIL/uL — ABNORMAL LOW (ref 3.87–5.11)
RDW: 12.5 % (ref 11.5–15.5)
WBC: 4.5 10*3/uL (ref 4.0–10.5)
nRBC: 0 % (ref 0.0–0.2)

## 2020-12-11 LAB — GLUCOSE, CAPILLARY
Glucose-Capillary: 112 mg/dL — ABNORMAL HIGH (ref 70–99)
Glucose-Capillary: 128 mg/dL — ABNORMAL HIGH (ref 70–99)
Glucose-Capillary: 186 mg/dL — ABNORMAL HIGH (ref 70–99)
Glucose-Capillary: 269 mg/dL — ABNORMAL HIGH (ref 70–99)
Glucose-Capillary: 361 mg/dL — ABNORMAL HIGH (ref 70–99)
Glucose-Capillary: 405 mg/dL — ABNORMAL HIGH (ref 70–99)
Glucose-Capillary: 471 mg/dL — ABNORMAL HIGH (ref 70–99)

## 2020-12-11 LAB — BASIC METABOLIC PANEL
Anion gap: 5 (ref 5–15)
BUN: 17 mg/dL (ref 6–20)
CO2: 27 mmol/L (ref 22–32)
Calcium: 8.2 mg/dL — ABNORMAL LOW (ref 8.9–10.3)
Chloride: 101 mmol/L (ref 98–111)
Creatinine, Ser: 0.6 mg/dL (ref 0.44–1.00)
GFR, Estimated: >60 mL/min (ref >60)
Glucose, Bld: 477 mg/dL — ABNORMAL HIGH (ref 70–99)
Potassium: 3.1 mmol/L — ABNORMAL LOW (ref 3.5–5.1)
Sodium: 133 mmol/L — ABNORMAL LOW (ref 135–145)

## 2020-12-11 LAB — ECHOCARDIOGRAM COMPLETE
Area-P 1/2: 3.08 cm2
Height: 66 in
S' Lateral: 2.2 cm
Weight: 1753.1 oz

## 2020-12-11 LAB — HEPARIN LEVEL (UNFRACTIONATED)
Heparin Unfractionated: 0.24 IU/mL — ABNORMAL LOW (ref 0.30–0.70)
Heparin Unfractionated: 0.34 IU/mL (ref 0.30–0.70)

## 2020-12-11 MED ORDER — HEPARIN BOLUS VIA INFUSION
750.0000 [IU] | INTRAVENOUS | Status: AC
  Administered 2020-12-11: 04:00:00 750 [IU] via INTRAVENOUS
  Filled 2020-12-11: qty 750

## 2020-12-11 MED ORDER — INSULIN GLARGINE-YFGN 100 UNIT/ML ~~LOC~~ SOLN
25.0000 [IU] | Freq: Every day | SUBCUTANEOUS | Status: DC
  Administered 2020-12-11 – 2020-12-16 (×5): 25 [IU] via SUBCUTANEOUS
  Filled 2020-12-11 (×7): qty 0.25

## 2020-12-11 MED ORDER — CLOPIDOGREL BISULFATE 75 MG PO TABS
75.0000 mg | ORAL_TABLET | Freq: Every day | ORAL | Status: DC
  Administered 2020-12-11 – 2020-12-16 (×6): 75 mg via ORAL
  Filled 2020-12-11 (×6): qty 1

## 2020-12-11 MED ORDER — CLOPIDOGREL BISULFATE 75 MG PO TABS: 75.0000 mg | ORAL_TABLET | Freq: Every day | ORAL | Status: DC

## 2020-12-11 MED ORDER — POTASSIUM CHLORIDE CRYS ER 20 MEQ PO TBCR
40.0000 meq | EXTENDED_RELEASE_TABLET | Freq: Four times a day (QID) | ORAL | Status: AC
  Administered 2020-12-11 (×2): 40 meq via ORAL
  Filled 2020-12-11 (×2): qty 2

## 2020-12-11 NOTE — Progress Notes (Signed)
PROGRESS NOTE    Beth Carroll  PFX:[id number] DOB: 19-Mar-1970 DOA: 12/09/2020 PCP: Baxter Hire, MD    Chief Complaint  Patient presents with   Edema    Brief Narrative:    Beth Carroll is a 50 y.o. female with medical history significant for DM, HTN, bilateral lower extremity edema for the past 3 months with negative venous doppler on 10/26, and with little response to empiric Lasix, compression elevated who presents to the emergency room with a several day history of nausea, poor appetite and dizziness on standing.  She continues to take her Lasix as prescribed.  She also reports bilateral lower extremity pain with ambulation that improves with rest as well as rest pain.  She denies fever or chills.  Denies chest pain no shortness of breath or cough.  Denies abdominal pain.     ABIs done in the ED 0.3, correlating with decreased capillary refill.  The ED provider contacted vascular surgeon Dr. Lucky Cowboy who was agreeable with heparin anticoagulation in preparation for angiogram in the a.m. Potassium repleted orally in the ED    Assessment & Plan:   Principal Problem:   Postural dizziness with presyncope Active Problems:   Hypertension   Rest pain of both lower extremities due to atherosclerosis (HCC)   Hyperglycemia due to type 2 diabetes mellitus (HCC)   Hypokalemia   Bilateral lower extremity edema   Severe peripheral arterial disease with rest pain -Her ABI in the critical range, she is with severe dependent edema. -Vascular surgery consult greatly appreciated, patient S/P   1.  Ultrasound guidance for vascular access right femoral artery             2.  Catheter placement into left common femoral artery from right femoral approach             3.  Aortogram and selective left lower extremity angiogram             4.  Percutaneous transluminal angioplasty of left anterior tibial artery with 2.5 mm angioplasty balloon             5.  Percutaneous transluminal angioplasty  of left posterior tibial artery with 2.5 mm diameter angioplasty balloon             6.  Mechanical thrombectomy to the left SFA and popliteal arteries with the Rota Rex device to debulk chronic thrombus             7.  Angioplasty of the left SFA and popliteal arteries with 4 mm diameter by 30 cm length and 4 mm diameter by 15 cm length Lutonix drug-coated angioplasty balloons             8.  Viabahn stent placement x2 to the left SFA and above-knee popliteal arteries with a 6 mm diameter by 25 cm length stent and 6 mm diameter by 7.5 cm length stent             9.  StarClose closure device right femoral artery  -Further management per vascular surgery, she remains on heparin GTT, plan for angiogram of right lower extremity with possible intervention, neurosurgery would like to wait for another 48 to 72 hours to clear the dye load before further intervention  -On heparin GTT.  Acute systolic CHF, -Cardiology input greatly appreciated, EKG concerning for possible prior anterior MI. -Likely will need diagnostic cath, timing per cardiology after discussing with vascular surgery      Postural dizziness with presyncope -  Continue to monitor on telemetry.    Hyperglycemia due to type 2 diabetes mellitus (HCC) - Sliding scale insulin coverage, resume home Lantus -CBGs poorly controlled, this is mildly as she did not receive her long-acting insulin as she was on procedure yesterday, will resume today at home dose of 25 units.   Severe hypokalemia -Repleted, continue to monitor closely  Tobacco abuse -She was counseled.     Hypertension - Continue antihypertensives     DVT prophylaxis: heparin GTT Code Status: Full Family Communication: none at bedside Disposition:   Status is: Inpatient  Remains inpatient appropriate because: Vascular intervention, on heparin GTT       Consultants:  Vascular surgery  Cardiology  Subjective:  Patient report left lower extremity pain has  improved. Objective: Vitals:   12/11/20 0045 12/11/20 0406 12/11/20 0801 12/11/20 1126  BP: 133/72 105/64 100/65 136/78  Pulse: 69  72 79  Resp: 16 18 18 18   Temp: 98 F (36.7 C) 98.1 F (36.7 C) 97.9 F (36.6 C) 97.8 F (36.6 C)  TempSrc: Oral Oral    SpO2: 99% 100% 100% 90%  Weight:  49.7 kg    Height:        Intake/Output Summary (Last 24 hours) at 12/11/2020 1414 Last data filed at 12/11/2020 0950 Gross per 24 hour  Intake 244.21 ml  Output 0 ml  Net 244.21 ml   Filed Weights   12/10/20 0244 12/11/20 0406  Weight: 49.7 kg 49.7 kg    Examination:  Awake Alert, Oriented X 3, No new F.N deficits, Normal affect Symmetrical Chest wall movement, Good air movement bilaterally, CTAB RRR,No Gallops,Rubs or new Murmurs, No Parasternal Heave +ve B.Sounds, Abd Soft, No tenderness, No rebound - guarding or rigidity. Trace bilateral lower extremity edema    Data Reviewed: I have personally reviewed following labs and imaging studies  CBC: Recent Labs  Lab 12/09/20 1501 12/10/20 0318 12/11/20 0138  WBC 4.7 4.5 4.5  HGB 13.5 12.5 10.9*  HCT 39.2 35.7* 31.5*  MCV 84.1 84.8 85.8  PLT 230 183 145*    Basic Metabolic Panel: Recent Labs  Lab 12/09/20 2056 12/10/20 0318 12/10/20 0458 12/10/20 0509 12/10/20 1840 12/11/20 0138  NA 132* 134* 137  --  137 133*  K 3.0* 3.0* 2.6*  --  3.1* 3.1*  CL 93* 95* 100  --  102 101  CO2 23 25 28   --  30 27  GLUCOSE 493* 453* 343*  --  320* 477*  BUN 23* 22* 20  --  19 17  CREATININE 0.87 0.81 0.65  --  0.64 0.60  CALCIUM 8.5* 8.9 8.8*  --  8.5* 8.2*  MG  --   --   --  2.0  --   --     GFR: Estimated Creatinine Clearance: 65.3 mL/min (by C-G formula based on SCr of 0.6 mg/dL).  Liver Function Tests: Recent Labs  Lab 12/09/20 1501  AST 15  ALT 12  ALKPHOS 59  BILITOT 2.2*  PROT 7.4  ALBUMIN 3.9    CBG: Recent Labs  Lab 12/11/20 0052 12/11/20 0240 12/11/20 0401 12/11/20 0807 12/11/20 1126  GLUCAP 471*  361* 269* 128* 186*     Recent Results (from the past 240 hour(s))  Resp Panel by RT-PCR (Flu A&B, Covid) Nasopharyngeal Swab     Status: None   Collection Time: 12/10/20  8:20 AM   Specimen: Nasopharyngeal Swab; Nasopharyngeal(NP) swabs in vial transport medium  Result Value Ref Range Status  SARS Coronavirus 2 by RT PCR NEGATIVE NEGATIVE Final    Comment: (NOTE) SARS-CoV-2 target nucleic acids are NOT DETECTED.  The SARS-CoV-2 RNA is generally detectable in upper respiratory specimens during the acute phase of infection. The lowest concentration of SARS-CoV-2 viral copies this assay can detect is 138 copies/mL. A negative result does not preclude SARS-Cov-2 infection and should not be used as the sole basis for treatment or other patient management decisions. A negative result may occur with  improper specimen collection/handling, submission of specimen other than nasopharyngeal swab, presence of viral mutation(s) within the areas targeted by this assay, and inadequate number of viral copies(<138 copies/mL). A negative result must be combined with clinical observations, patient history, and epidemiological information. The expected result is Negative.  Fact Sheet for Patients:  EntrepreneurPulse.com.au  Fact Sheet for Healthcare Providers:  IncredibleEmployment.be  This test is no t yet approved or cleared by the [place] FDA and  has been authorized for detection and/or diagnosis of SARS-CoV-2 by FDA under an Emergency Use Authorization (EUA). This EUA will remain  in effect (meaning this test can be used) for the duration of the COVID-19 declaration under Section 564(b)(1) of the Act, 21 U.S.C.section 360bbb-3(b)(1), unless the authorization is terminated  or revoked sooner.       Influenza A by PCR NEGATIVE NEGATIVE Final   Influenza B by PCR NEGATIVE NEGATIVE Final    Comment: (NOTE) The Xpert Xpress SARS-CoV-2/FLU/RSV plus  assay is intended as an aid in the diagnosis of influenza from Nasopharyngeal swab specimens and should not be used as a sole basis for treatment. Nasal washings and aspirates are unacceptable for Xpert Xpress SARS-CoV-2/FLU/RSV testing.  Fact Sheet for Patients: EntrepreneurPulse.com.au  Fact Sheet for Healthcare Providers: IncredibleEmployment.be  This test is not yet approved or cleared by the [place] FDA and has been authorized for detection and/or diagnosis of SARS-CoV-2 by FDA under an Emergency Use Authorization (EUA). This EUA will remain in effect (meaning this test can be used) for the duration of the COVID-19 declaration under Section 564(b)(1) of the Act, 21 U.S.C. section 360bbb-3(b)(1), unless the authorization is terminated or revoked.  Performed at [facility], [address] [place], [place] [address]          Radiology Studies: DG Chest 2 View  Result Date: 12/09/2020 CLINICAL DATA:  Bilateral lower extremity swelling. History high blood pressure and diabetes S. EXAM: CHEST - 2 VIEW COMPARISON:  None. FINDINGS: The cardiac silhouette, mediastinal and hilar contours are normal. The lungs are clear of an acute process. Mild hyperinflation. No pleural effusions or pulmonary lesions. The bony thorax is intact. IMPRESSION: No acute cardiopulmonary findings. Electronically Signed   By: Marijo Sanes M.D.   On: 12/09/2020 15:37   PERIPHERAL VASCULAR CATHETERIZATION  Result Date: 12/10/2020 See surgical note for result.  ECHOCARDIOGRAM COMPLETE  Result Date: 12/11/2020    ECHOCARDIOGRAM REPORT   Patient Name:   Beth Carroll Date of Exam: 12/10/2020 Medical Rec #:  [id number]     Height:       66.0 in Accession #:    [id number]    Weight:       109.6 lb Date of Birth:  02/04/1970     BSA:          1.549 m Patient Age:    35 years      BP:           96/77 mmHg Patient Gender: F  HR:           68 bpm.  Exam Location:  [facility] Procedure: 2D Echo, Cardiac Doppler, Color Doppler and Intracardiac            Opacification Agent Indications:     P32.95 Acute Diastolic CHF  History:         Patient has no prior history of Echocardiogram examinations.                  Risk Factors:Diabetes and Hypertension.  Sonographer:     Cresenciano Lick RDCS Referring Phys:  [place] Diagnosing Phys: Ida Rogue MD IMPRESSIONS  1. Left ventricular ejection fraction, by estimation, is 40 to 45%. The left ventricle has mildly decreased function. The left ventricle demonstrates regional wall motion abnormalities (Apical and periapical hypokinesis, basal and mid wall motion best preserved, consistent with possible stress cardiomyopathy though unable to exclude ishcemia). Left ventricular diastolic parameters are consistent with Grade II diastolic dysfunction (pseudonormalization).  2. Right ventricular systolic function is normal. The right ventricular size is normal. Tricuspid regurgitation signal is inadequate for assessing PA pressure.  3. The mitral valve is normal in structure. No evidence of mitral valve regurgitation. No evidence of mitral stenosis.  4. The aortic valve is normal in structure. Aortic valve regurgitation is not visualized. No aortic stenosis is present.  5. The inferior vena cava is dilated in size with <50% respiratory variability, suggesting right atrial pressure of 15 mmHg. FINDINGS  Left Ventricle: Left ventricular ejection fraction, by estimation, is 40 to 45%. The left ventricle has mildly decreased function. The left ventricle demonstrates regional wall motion abnormalities. Definity contrast agent was given IV to delineate the left ventricular endocardial borders. The left ventricular internal cavity size was normal in size. There is no left ventricular hypertrophy. Left ventricular diastolic parameters are consistent with Grade II diastolic dysfunction (pseudonormalization). Right  Ventricle: The right ventricular size is normal. No increase in right ventricular wall thickness. Right ventricular systolic function is normal. Tricuspid regurgitation signal is inadequate for assessing PA pressure. Left Atrium: Left atrial size was normal in size. Right Atrium: Right atrial size was normal in size. Pericardium: There is no evidence of pericardial effusion. Mitral Valve: The mitral valve is normal in structure. No evidence of mitral valve regurgitation. No evidence of mitral valve stenosis. Tricuspid Valve: The tricuspid valve is normal in structure. Tricuspid valve regurgitation is not demonstrated. No evidence of tricuspid stenosis. Aortic Valve: The aortic valve is normal in structure. Aortic valve regurgitation is not visualized. No aortic stenosis is present. Pulmonic Valve: The pulmonic valve was normal in structure. Pulmonic valve regurgitation is not visualized. No evidence of pulmonic stenosis. Aorta: The aortic root is normal in size and structure. Venous: The inferior vena cava is dilated in size with less than 50% respiratory variability, suggesting right atrial pressure of 15 mmHg. IAS/Shunts: No atrial level shunt detected by color flow Doppler.  LEFT VENTRICLE PLAX 2D LVIDd:         3.70 cm   Diastology LVIDs:         2.20 cm   LV e' medial:    5.55 cm/s LV PW:         0.90 cm   LV E/e' medial:  12.0 LV IVS:        0.90 cm   LV e' lateral:   8.92 cm/s LVOT diam:     2.00 cm   LV E/e' lateral: 7.4 LV SV:  80 LV SV Index:   52 LVOT Area:     3.14 cm  RIGHT VENTRICLE             IVC RV Basal diam:  3.60 cm     IVC diam: 3.00 cm RV S prime:     14.90 cm/s TAPSE (M-mode): 2.1 cm LEFT ATRIUM             Index        RIGHT ATRIUM           Index LA diam:        3.70 cm 2.39 cm/m   RA Area:     12.20 cm LA Vol (A2C):   39.8 ml 25.70 ml/m  RA Volume:   30.90 ml  19.95 ml/m LA Vol (A4C):   40.6 ml 26.22 ml/m LA Biplane Vol: 40.2 ml 25.96 ml/m  AORTIC VALVE LVOT Vmax:   123.00  cm/s LVOT Vmean:  105.000 cm/s LVOT VTI:    0.256 m  AORTA Ao Root diam: 3.00 cm MITRAL VALVE MV Area (PHT): 3.08 cm    SHUNTS MV Decel Time: 246 msec    Systemic VTI:  0.26 m MV E velocity: 66.40 cm/s  Systemic Diam: 2.00 cm MV A velocity: 57.40 cm/s MV E/A ratio:  1.16 Ida Rogue MD Electronically signed by Ida Rogue MD Signature Date/Time: 12/11/2020/8:09:01 AM    Final         Scheduled Meds:  aspirin EC  81 mg Oral Daily   atorvastatin  20 mg Oral Daily   insulin aspart  0-15 Units Subcutaneous Q4H   insulin glargine-yfgn  25 Units Subcutaneous Daily   Continuous Infusions:  heparin 1,000 Units/hr (12/11/20 0403)     LOS: 1 day        Phillips Climes, MD [facility]   To contact the attending provider between 7A-7P or the covering provider during after hours 7P-7A, please log into the web site [email or web address] and access using universal [place] password for that web site. If you do not have the password, please call the hospital operator.  12/11/2020, 2:14 PM

## 2020-12-11 NOTE — Progress Notes (Addendum)
Inpatient Diabetes Program Recommendations  AACE/ADA: New Consensus Statement on Inpatient Glycemic Control (2015)  Target Ranges:  Prepandial:   less than 140 mg/dL      Peak postprandial:   less than 180 mg/dL (1-2 hours)      Critically ill patients:  140 - 180 mg/dL    Latest Reference Range & Units 12/10/20 03:08 12/10/20 04:04 12/10/20 08:14 12/10/20 10:46 12/10/20 11:35 12/10/20 17:09 12/10/20 20:12 12/10/20 20:30 12/10/20 22:37  Glucose-Capillary 70 - 99 mg/dL 436 (H)  15 units Novolog  386 (H) 320 (H)  11 units Novolog  257 (H) 242 (H)  5 units Novolog  234 (H)  8 units Novolog  429 (H) 466 (H)  10 units Novolog  455 (H)  10 units Novolog     Latest Reference Range & Units 12/11/20 00:52 12/11/20 02:40 12/11/20 04:01  Glucose-Capillary 70 - 99 mg/dL 471 (H)  15 units Novolog  361 (H) 269 (H)  8 units Novolog     Latest Reference Range & Units 12/10/20 04:42  Hemoglobin A1C 4.8 - 5.6 % 13.7 (H)  (346 mg/dl)    Admit with: Severe peripheral arterial disease with rest pain Postural dizziness with presyncope  History: DM2  Home DM Meds: Lantus 25 units daily  Current Orders: Semglee 25 units Daily      Novolog Moderate Correction Scale/ SSI (0-15 units) Q4 hours   Patient had office visit with Dr. Harrel Lemon on 12/03/20 and A1c on 10/23/20 @ [facility] was 14.0 and currently A1c is 13.7     MD- Note Semglee never started yesterday.    Due to get 25 units Semglee this AM which will hopefully bring CBGs down.  MD- Please consider giving pt a Novolog SSi to use TID with meals at home.  Current A1c= 13.7%.  Has appt with [facility] Endocrinology March 26, 2021 but may need Novolog SSi at home to help better manage CBGs until her 1st appt with ENDO.  Pt prefers insulin pens.  Pt also requesting refills on her Lancets and CBG meter strips.   Addendum 11:15am--Met w/ pt at bedside this AM.  Pt told me she hasn't been checking her CBGs often  because she ran out of strips and lancets at home.  Usually checks CBGs in the AM when she has supplies.  Acknowledged that her A1c was 14% back in Sept.  Understands it needs to be closer to 7% to prevent further vascular and organ damage.  Pt told me she has insulin at home.  Showed me that she has a full Basaglar insulin pen in her belongings and has refills.  States she does not miss doses.  Pt confirmed she has an appt with Dr. Honor Junes with [facility] Endocr[facility]ology on 03/26/2021.  Has only taken basal insulin and has never used Novolog or any other rapid-acting insulin at home.  Discussed with pt that she may need meal time insulin w/ a rapid acting insulin and that she may not be producing sufficient endogenous insulin to control her CBGs.  Discussed with pt that I will send a request to the Attending MD asking for refills for her lancets and strips and will also ask the Attending MD if they will consider giving her a Novolog SSi to follow TID with meals at home until she has her 1st visit with the ENDO in March.  Pt agreeable to taking Novolog TID with meals at home if needed.  Prefers insulin pens.  Asked pt to please check  her CBGs at least BID at home (always 1st thing in the AM and vary the 2nd check of the day either before another meal or 2 hours after a meal).  Also encouraged pt to check TID before meals at home for several weeks to get a stream of CBG data that she can take to her ENDO appt in March (try to check TID before meals at least 2 weeks prior to ENDO appt).  Reviewed CBG and A1c goals for home.  Encouraged pt to continue to drink water and non-caloric/diet drinks only.  Of note, pt had water and diet soda at bedside.    --Will follow patient during hospitalization--  Wyn Quaker RN, MSN, CDE Diabetes Coordinator Inpatient Glycemic Control Team Team Pager: [phone] (8a-5p)

## 2020-12-11 NOTE — Progress Notes (Signed)
[place] for heparin Indication:  PAD / limb ischemia  No Known Allergies  Patient Measurements: Height: 5\' 6"  (167.6 cm) Weight: 49.7 kg (109 lb 9.1 oz) IBW/kg (Calculated) : 59.3 Heparin Dosing Weight: 50 kg  Vital Signs: Temp: 98 F (36.7 C) (11/17 0045) Temp Source: Oral (11/17 0045) BP: 133/72 (11/17 0045) Pulse Rate: 69 (11/17 0045)  Labs: Recent Labs    12/09/20 1501 12/09/20 1815 12/09/20 2056 12/09/20 2337 12/10/20 0318 12/10/20 0442 12/10/20 0458 12/10/20 1840 12/11/20 0138  HGB 13.5  --   --   --  12.5  --   --   --  10.9*  HCT 39.2  --   --   --  35.7*  --   --   --  31.5*  PLT 230  --   --   --  183  --   --   --  145*  APTT  --   --   --  78*  --   --   --   --   --   LABPROT  --   --   --  15.0  --   --   --   --   --   INR  --   --   --  1.2  --   --   --   --   --   HEPARINUNFRC  --   --   --   --   --  <0.10*  --  0.16* 0.24*  CREATININE 0.92  --    < >  --  0.81  --  0.65 0.64 0.60  TROPONINIHS 28* 27*  --   --   --   --   --   --   --    < > = values in this interval not displayed.     Estimated Creatinine Clearance: 65.3 mL/min (by C-G formula based on SCr of 0.6 mg/dL).   Medical History: Past Medical History:  Diagnosis Date   Diabetes mellitus without complication ([place])    Hypertension      Assessment: 50 year old female presented with edema and dizziness. No anticoagulation noted PTA. Pharmacy consult for heparin management.  Goal of Therapy:  Heparin level 0.3-0.7 units/ml Monitor platelets by anticoagulation protocol: Yes  Date Time  HL Rate/Comment 11/16  0442  <0.1 Subthera; 600>750 un/hr    11/16  ~0900   -- Heparin held for procedure. 11/16  ~1230   -- Heparin resumed; 750 un/hr 11/16  ~1840   0.16  Heparin increased; 900 units/hr 11/17   0240   0.24 Subtherapeutic     Plan:  Heparin bolus of 750 units x 1  Increase heparin infusion to 1000 units/hr.  Recheck HL in 6 hrs  after rate change.  CBC daily while on heparin.   Renda Rolls, PharmD, [facility] 12/11/2020 3:25 AM

## 2020-12-11 NOTE — Progress Notes (Addendum)
Pt blood sugar at 405. NP Randol Kern made aware. Will continue to monitor.  Update 2109: NP Randol Kern ordered to covered blood sugar per sliding scale parameter. Will continue to monitor.

## 2020-12-11 NOTE — Plan of Care (Signed)

## 2020-12-11 NOTE — Consult Note (Signed)
Cardiology Consultation:   Patient ID: Beth Carroll; [id number]; 31-Mar-1970   Admit date: 12/09/2020 Date of Consult: 12/11/2020  Primary Care Provider: Baxter Hire, MD Primary Cardiologist: New to [facility] - consult by [facility] Primary Electrophysiologist:  None   Patient Profile:   Beth Carroll is a 50 y.o. female with a hx of DM2, HTN, and lower extremity swelling and ongoing tobacco use who is being seen today for the evaluation of abnormal echo at the request of Dr. Waldron Labs.  History of Present Illness:   Beth Carroll has no previously known cardiac history.  She was admitted to [facility] on 12/09/2020 with positional dizziness, nausea, and poor appetite.  She had previously been prescribed Lasix for lower extremity swelling without significant improvement.  Upon arrival to the ED she was hypertensive with BP in the 852D systolic, hypokalemic with a potassium of 2.4, BNP 806, and high-sensitivity troponin 28 with a delta of 27.  EKG showed a sinus rhythm with possible prior anterior infarct.  Chest x-ray showed no acute findings.  ABIs performed in the ED were notable for 0.4 on the right and 0.3 on the left with very poor waveforms.  She was started on a heparin drip and vascular surgery was consulted.  On 11/16 and she underwent left lower extremity intervention as outlined in the operative note.  Plans are to intervene on the right lower extremity, with timing uncertain at this time.  Echo showed an EF of 40-45%, apical and periapical hypokinesis with wall motion best preserved along the basal/mid wall, Gr2DD, normal RV systolic function and ventricular cavity size, estimated right atrial pressure 15 mmHg, and no significant valvular abnormalities.  Due to abnormal echo, cardiology was consulted.  At this time, she is without symptoms of angina or decompensation.  She does feel like her lower extremity swelling is improving, though she does note "tingling."  She drinks numerous diet  Dr. Samson Frederic throughout the day, and has 13, twenty ounce diet Dr. Samson Frederic in the room.  She does continue to smoke.  She has been without any symptoms concerning for angina or decompensation.  Currently, she is without cardiac complaint.   Past Medical History:  Diagnosis Date   Diabetes mellitus without complication ([place])    Hypertension     Past Surgical History:  Procedure Laterality Date   LOWER EXTREMITY ANGIOGRAPHY Left 12/10/2020   Procedure: Lower Extremity Angiography;  Surgeon: Algernon Huxley, MD;  Location: [place] CV LAB;  Service: Cardiovascular;  Laterality: Left;     Home Meds: Prior to Admission medications   Medication Sig Start Date End Date Taking? Authorizing Provider  albuterol (VENTOLIN HFA) 108 (90 Base) MCG/ACT inhaler Inhale 2 puffs into the lungs every 6 (six) hours as needed.   Yes [provider]  furosemide (LASIX) 20 MG tablet Take 20 mg by mouth daily. 12/03/20  Yes [provider]  hydrochlorothiazide (HYDRODIURIL) 25 MG tablet Take 25 mg by mouth daily. 10/28/20  Yes [provider]  insulin glargine (LANTUS SOLOSTAR) 100 UNIT/ML Solostar Pen Inject 25 Units into the skin daily.   Yes [provider]  lisinopril (ZESTRIL) 40 MG tablet Take 40 mg by mouth daily. 10/23/20  Yes [provider]  traMADol (ULTRAM) 50 MG tablet Take 50 mg by mouth every 6 (six) hours as needed. 12/05/20  Yes [provider]  Vitamin D, Ergocalciferol, (DRISDOL) 1.25 MG (50000 UNIT) CAPS capsule Take 50,000 Units by mouth once a week. 10/28/20  Yes [provider]    Inpatient Medications: Scheduled Meds:  aspirin EC  81 mg Oral Daily   atorvastatin  20 mg Oral Daily   insulin aspart  0-15 Units Subcutaneous Q4H   insulin glargine-yfgn  25 Units Subcutaneous Daily   potassium chloride  40 mEq Oral Q6H   Continuous Infusions:  heparin 1,000 Units/hr (12/11/20 0403)   PRN Meds: acetaminophen **OR**  acetaminophen, hydrALAZINE, HYDROcodone-acetaminophen, morphine injection, ondansetron **OR** ondansetron (ZOFRAN) IV  Allergies:  No Known Allergies  Social History:   Social History   Socioeconomic History   Marital status: Single    Spouse name: Not on file   Number of children: Not on file   Years of education: Not on file   Highest education level: Not on file  Occupational History   Not on file  Tobacco Use   Smoking status: Never   Smokeless tobacco: Never  Substance and Sexual Activity   Alcohol use: Never   Drug use: Not on file   Sexual activity: Not on file  Other Topics Concern   Not on file  Social History Narrative   Not on file   Social Determinants of Health   Financial Resource Strain: Not on file  Food Insecurity: Not on file  Transportation Needs: Not on file  Physical Activity: Not on file  Stress: Not on file  Social Connections: Not on file  Intimate Partner Violence: Not on file     Family History:   Family History  Problem Relation Age of Onset   Hypertension Father     ROS:  Review of Systems  Constitutional:  Positive for malaise/fatigue. Negative for chills, diaphoresis, fever and weight loss.  HENT:  Negative for congestion.   Eyes:  Negative for discharge and redness.  Respiratory:  Negative for cough, sputum production, shortness of breath and wheezing.   Cardiovascular:  Positive for claudication and leg swelling. Negative for chest pain, palpitations, orthopnea and PND.  Gastrointestinal:  Negative for abdominal pain, heartburn, nausea and vomiting.  Musculoskeletal:  Negative for falls and myalgias.  Skin:  Negative for rash.  Neurological:  Positive for weakness. Negative for dizziness, tingling, tremors, sensory change, speech change, focal weakness and loss of consciousness.  Endo/Heme/Allergies:  Does not bruise/bleed easily.  Psychiatric/Behavioral:  Negative for substance abuse. The patient is not nervous/anxious.   All  other systems reviewed and are negative.    Physical Exam/Data:   Vitals:   12/10/20 2010 12/11/20 0045 12/11/20 0406 12/11/20 0801  BP: 96/67 133/72 105/64 100/65  Pulse: 73 69  72  Resp: 16 16 18 18   Temp: 98.2 F (36.8 C) 98 F (36.7 C) 98.1 F (36.7 C) 97.9 F (36.6 C)  TempSrc: Oral Oral Oral   SpO2: 97% 99% 100% 100%  Weight:   49.7 kg   Height:        Intake/Output Summary (Last 24 hours) at 12/11/2020 1035 Last data filed at 12/11/2020 0950 Gross per 24 hour  Intake 244.21 ml  Output 0 ml  Net 244.21 ml   Filed Weights   12/10/20 0244 12/11/20 0406  Weight: 49.7 kg 49.7 kg   Body mass index is 17.68 kg/m.   Physical Exam: General: Well developed, well nourished, in no acute distress. Head: Normocephalic, atraumatic, sclera non-icteric, no xanthomas, nares without discharge.  Neck: Negative for carotid bruits. JVD not elevated. Lungs: Clear bilaterally to auscultation without wheezes, rales, or rhonchi. Breathing is unlabored. Heart: RRR with S1 S2. No murmurs,  rubs, or gallops appreciated. Abdomen: Soft, non-tender, non-distended with normoactive bowel sounds. No hepatomegaly. No rebound/guarding. No obvious abdominal masses. Msk:  Strength and tone appear normal for age. Extremities: No clubbing or cyanosis.  Trace bilateral lower extremity edema. Distal pedal pulses are 2+ and equal bilaterally. Neuro: Alert and oriented X 3. No facial asymmetry. No focal deficit. Moves all extremities spontaneously. Psych:  Responds to questions appropriately with a normal affect.   EKG:  The EKG was personally reviewed and demonstrates: NSR, 76 bpm, left axis deviation, possible prior anterior MI, poor R wave progression along the precordial leads, baseline wandering Telemetry:  Telemetry was personally reviewed and demonstrates: SR  Weights: Filed Weights   12/10/20 0244 12/11/20 0406  Weight: 49.7 kg 49.7 kg    Relevant CV Studies:  2D echo 12/10/2020: 1. Left  ventricular ejection fraction, by estimation, is 40 to 45%. The  left ventricle has mildly decreased function. The left ventricle  demonstrates regional wall motion abnormalities (Apical and periapical  hypokinesis, basal and mid wall motion best  preserved, consistent with possible stress cardiomyopathy though unable to  exclude ishcemia). Left ventricular diastolic parameters are consistent  with Grade II diastolic dysfunction (pseudonormalization).   2. Right ventricular systolic function is normal. The right ventricular  size is normal. Tricuspid regurgitation signal is inadequate for assessing  PA pressure.   3. The mitral valve is normal in structure. No evidence of mitral valve  regurgitation. No evidence of mitral stenosis.   4. The aortic valve is normal in structure. Aortic valve regurgitation is  not visualized. No aortic stenosis is present.   5. The inferior vena cava is dilated in size with <50% respiratory  variability, suggesting right atrial pressure of 15 mmHg.  Laboratory Data:  Chemistry Recent Labs  Lab 12/10/20 0458 12/10/20 1840 12/11/20 0138  NA 137 137 133*  K 2.6* 3.1* 3.1*  CL 100 102 101  CO2 28 30 27   GLUCOSE 343* 320* 477*  BUN 20 19 17   CREATININE 0.65 0.64 0.60  CALCIUM 8.8* 8.5* 8.2*  GFRNONAA >60 >60 >60  ANIONGAP 9 5 5     Recent Labs  Lab 12/09/20 1501  PROT 7.4  ALBUMIN 3.9  AST 15  ALT 12  ALKPHOS 59  BILITOT 2.2*   Hematology Recent Labs  Lab 12/09/20 1501 12/10/20 0318 12/11/20 0138  WBC 4.7 4.5 4.5  RBC 4.66 4.21 3.67*  HGB 13.5 12.5 10.9*  HCT 39.2 35.7* 31.5*  MCV 84.1 84.8 85.8  MCH 29.0 29.7 29.7  MCHC 34.4 35.0 34.6  RDW 12.7 12.5 12.5  PLT 230 183 145*   Cardiac EnzymesNo results for input(s): TROPONINI in the last 168 hours. No results for input(s): TROPIPOC in the last 168 hours.  BNP Recent Labs  Lab 12/09/20 1815  BNP 106.3*    DDimer No results for input(s): DDIMER in the last 168  hours.  Radiology/Studies:  DG Chest 2 View  Result Date: 12/09/2020 IMPRESSION: No acute cardiopulmonary findings. Electronically Signed   By: Marijo Sanes M.D.   On: 12/09/2020 15:37   PERIPHERAL VASCULAR CATHETERIZATION  Result Date: 12/10/2020 See surgical note for result.  Assessment and Plan:   1. Acute HFrEF with mildly elevated high sensitivity troponin: -No symptoms of angina or decompensation at this time -Minimally elevated high sensitivity troponin not consistent with ACS, possibly supply demand ischemia  -EKG concerning for possible prior anterior MI, leading her cardiomyopathy to possibly be ischemic in etiology  -Will benefit from  diagnostic R/LHC, will discuss potential timing with vascular surgery based on prior indication for right lower extremity intervention -BP has been soft at times into the 30Y to 51T systolic, currently in the low 100s -Escalate GDMT as tolerated -Daily weights -Strict I/O  2. PAD: -Status post left-sided-sided intervention on 11/16 -Plans for right-sided intervention, with timing to be determined -Management per vascular surgery  3. Tobacco use: -Complete cessation is recommended   4. Hypokalemia: -Improving -Replete to goal 4.0, oral repletion is ordered  -Magnesium at goal  5. Positional dizziness: -Improved    For questions or updates, please contact [place] Please consult [email or web address] for contact info under Cardiology/STEMI.   Signed, Christell Faith, PA-C [place] Pager: [phone] 12/11/2020, 10:35 AM

## 2020-12-11 NOTE — Progress Notes (Signed)
[place] for heparin Indication:  PAD / limb ischemia  No Known Allergies  Patient Measurements: Height: 5\' 6"  (167.6 cm) Weight: 49.7 kg (109 lb 9.1 oz) IBW/kg (Calculated) : 59.3 Heparin Dosing Weight: 50 kg  Vital Signs: Temp: 97.8 F (36.6 C) (11/17 1126) Temp Source: Oral (11/17 0406) BP: 136/78 (11/17 1126) Pulse Rate: 79 (11/17 1126)  Labs: Recent Labs    12/09/20 1501 12/09/20 1815 12/09/20 2056 12/09/20 2337 12/10/20 0318 12/10/20 0442 12/10/20 0458 12/10/20 1840 12/11/20 0138 12/11/20 0939  HGB 13.5  --   --   --  12.5  --   --   --  10.9*  --   HCT 39.2  --   --   --  35.7*  --   --   --  31.5*  --   PLT 230  --   --   --  183  --   --   --  145*  --   APTT  --   --   --  78*  --   --   --   --   --   --   LABPROT  --   --   --  15.0  --   --   --   --   --   --   INR  --   --   --  1.2  --   --   --   --   --   --   HEPARINUNFRC  --   --   --   --   --    < >  --  0.16* 0.24* 0.34  CREATININE 0.92  --    < >  --  0.81  --  0.65 0.64 0.60  --   TROPONINIHS 28* 27*  --   --   --   --   --   --   --   --    < > = values in this interval not displayed.     Estimated Creatinine Clearance: 65.3 mL/min (by C-G formula based on SCr of 0.6 mg/dL).   Medical History: Past Medical History:  Diagnosis Date   Diabetes mellitus without complication ([place])    Hypertension      Assessment: 50 year old female presented with edema and dizziness. No anticoagulation noted PTA. Pharmacy consult for heparin management.  Goal of Therapy:  Heparin level 0.3-0.7 units/ml Monitor platelets by anticoagulation protocol: Yes  Date Time  HL Rate/Comment 11/16  0442   <0.1 Subthera; 600>750 un/hr    11/16  ~0900    -- Heparin held for procedure. 11/16  ~1230    -- Heparin resumed; 750 un/hr 11/16  ~1840  0.16  Heparin increased; 900 units/hr 11/17   0240  0.24 Subtherapeutic 11/17  0939  0.34 Thera x1; 1000 un/hr     Plan:   HL therapeutic at 0.34.  Continue heparin infusion at 1000 units/hr.  Recheck HL in 6 hrs to confirm rate & daily once consecutively therapeutic.  CBC daily while on heparin.   Lorna Dibble, PharmD, [facility] Clinical Pharmacist 12/11/2020 11:46 AM

## 2020-12-12 DIAGNOSIS — I5021 Acute systolic (congestive) heart failure: Secondary | ICD-10-CM

## 2020-12-12 DIAGNOSIS — I998 Other disorder of circulatory system: Secondary | ICD-10-CM | POA: Diagnosis not present

## 2020-12-12 LAB — GLUCOSE, CAPILLARY
Glucose-Capillary: 120 mg/dL — ABNORMAL HIGH (ref 70–99)
Glucose-Capillary: 168 mg/dL — ABNORMAL HIGH (ref 70–99)
Glucose-Capillary: 176 mg/dL — ABNORMAL HIGH (ref 70–99)
Glucose-Capillary: 184 mg/dL — ABNORMAL HIGH (ref 70–99)
Glucose-Capillary: 230 mg/dL — ABNORMAL HIGH (ref 70–99)
Glucose-Capillary: 278 mg/dL — ABNORMAL HIGH (ref 70–99)

## 2020-12-12 LAB — BASIC METABOLIC PANEL
Anion gap: 6 (ref 5–15)
BUN: 12 mg/dL (ref 6–20)
CO2: 29 mmol/L (ref 22–32)
Calcium: 8.6 mg/dL — ABNORMAL LOW (ref 8.9–10.3)
Chloride: 103 mmol/L (ref 98–111)
Creatinine, Ser: 0.31 mg/dL — ABNORMAL LOW (ref 0.44–1.00)
GFR, Estimated: >60 mL/min (ref >60)
Glucose, Bld: 183 mg/dL — ABNORMAL HIGH (ref 70–99)
Potassium: 4 mmol/L (ref 3.5–5.1)
Sodium: 138 mmol/L (ref 135–145)

## 2020-12-12 LAB — LIPID PANEL
Cholesterol: 120 mg/dL (ref 0–200)
HDL: 45 mg/dL (ref >40)
LDL Cholesterol: 63 mg/dL (ref 0–99)
Total CHOL/HDL Ratio: 2.7 RATIO
Triglycerides: 59 mg/dL (ref <150)
VLDL: 12 mg/dL (ref 0–40)

## 2020-12-12 LAB — CBC
HCT: 33.6 % — ABNORMAL LOW (ref 36.0–46.0)
Hemoglobin: 11.1 g/dL — ABNORMAL LOW (ref 12.0–15.0)
MCH: 29.4 pg (ref 26.0–34.0)
MCHC: 33 g/dL (ref 30.0–36.0)
MCV: 88.9 fL (ref 80.0–100.0)
Platelets: 131 10*3/uL — ABNORMAL LOW (ref 150–400)
RBC: 3.78 MIL/uL — ABNORMAL LOW (ref 3.87–5.11)
RDW: 12.8 % (ref 11.5–15.5)
WBC: 4.8 10*3/uL (ref 4.0–10.5)
nRBC: 0 % (ref 0.0–0.2)

## 2020-12-12 MED ORDER — INSULIN ASPART 100 UNIT/ML IJ SOLN
0.0000 [IU] | Freq: Three times a day (TID) | INTRAMUSCULAR | Status: DC
  Administered 2020-12-12 (×2): 3 [IU] via SUBCUTANEOUS
  Administered 2020-12-13: 09:00:00 5 [IU] via SUBCUTANEOUS
  Administered 2020-12-13: 16:00:00 3 [IU] via SUBCUTANEOUS
  Administered 2020-12-13 – 2020-12-14 (×2): 5 [IU] via SUBCUTANEOUS
  Administered 2020-12-14 (×2): 3 [IU] via SUBCUTANEOUS
  Administered 2020-12-16: 5 [IU] via SUBCUTANEOUS
  Administered 2020-12-16: 8 [IU] via SUBCUTANEOUS
  Filled 2020-12-12 (×10): qty 1

## 2020-12-12 MED ORDER — CARVEDILOL 3.125 MG PO TABS
3.1250 mg | ORAL_TABLET | Freq: Two times a day (BID) | ORAL | Status: DC
  Administered 2020-12-12 – 2020-12-16 (×7): 3.125 mg via ORAL
  Filled 2020-12-12 (×7): qty 1

## 2020-12-12 MED ORDER — INSULIN ASPART 100 UNIT/ML IJ SOLN
3.0000 [IU] | Freq: Three times a day (TID) | INTRAMUSCULAR | Status: DC
  Administered 2020-12-12 – 2020-12-16 (×10): 3 [IU] via SUBCUTANEOUS
  Filled 2020-12-12 (×8): qty 1

## 2020-12-12 MED ORDER — ENOXAPARIN SODIUM 40 MG/0.4ML IJ SOSY
40.0000 mg | PREFILLED_SYRINGE | INTRAMUSCULAR | Status: DC
  Administered 2020-12-12 – 2020-12-15 (×4): 40 mg via SUBCUTANEOUS
  Filled 2020-12-12 (×4): qty 0.4

## 2020-12-12 MED ORDER — INSULIN ASPART 100 UNIT/ML IJ SOLN
0.0000 [IU] | Freq: Every day | INTRAMUSCULAR | Status: DC
  Administered 2020-12-14 – 2020-12-15 (×2): 3 [IU] via SUBCUTANEOUS
  Filled 2020-12-12 (×2): qty 1

## 2020-12-12 NOTE — Progress Notes (Signed)
Progress Note  Patient Name: Beth Carroll Date of Encounter: 12/12/2020  Primary Cardiologist: New to [facility] - consult by [place]  Subjective   No chest pain, dyspnea, palpitations, dizziness, presyncope, or syncope.   Inpatient Medications    Scheduled Meds:  aspirin EC  81 mg Oral Daily   atorvastatin  20 mg Oral Daily   clopidogrel  75 mg Oral Daily   insulin aspart  0-15 Units Subcutaneous TID WC   insulin aspart  0-5 Units Subcutaneous QHS   insulin aspart  3 Units Subcutaneous TID WC   insulin glargine-yfgn  25 Units Subcutaneous Daily   Continuous Infusions:  PRN Meds: acetaminophen **OR** acetaminophen, hydrALAZINE, HYDROcodone-acetaminophen, morphine injection, ondansetron **OR** ondansetron (ZOFRAN) IV   Vital Signs    Vitals:   12/12/20 0233 12/12/20 0459 12/12/20 0727 12/12/20 0755  BP: 121/69 (!) 143/80  (!) 151/90  Pulse: 84 80  76  Resp: 20 18 11 18   Temp: 98.7 F (37.1 C) 98.3 F (36.8 C)  98.6 F (37 C)  TempSrc:      SpO2: 99% 96%  100%  Weight:      Height:        Intake/Output Summary (Last 24 hours) at 12/12/2020 0850 Last data filed at 12/11/2020 1849 Gross per 24 hour  Intake 120 ml  Output 0 ml  Net 120 ml   Filed Weights   12/10/20 0244 12/11/20 0406  Weight: 49.7 kg 49.7 kg    Telemetry    SR - Personally Reviewed  ECG    No new tracings - Personally Reviewed  Physical Exam   GEN: No acute distress.   Neck: No JVD. Cardiac: RRR, no murmurs, rubs, or gallops.  Respiratory: Clear to auscultation bilaterally.  GI: Soft, nontender, non-distended.   MS: Trace bilateral pretibital edema; No deformity. Neuro:  Alert and oriented x 3; Nonfocal.  Psych: Normal affect.  Labs    Chemistry Recent Labs  Lab 12/09/20 1501 12/09/20 2056 12/10/20 1840 12/11/20 0138 12/12/20 0532  NA 132*   < > 137 133* 138  K 2.4*   < > 3.1* 3.1* 4.0  CL 87*   < > 102 101 103  CO2 23   < > 30 27 29   GLUCOSE 392*   < > 320* 477*  183*  BUN 25*   < > 19 17 12   CREATININE 0.92   < > 0.64 0.60 0.31*  CALCIUM 9.2   < > 8.5* 8.2* 8.6*  PROT 7.4  --   --   --   --   ALBUMIN 3.9  --   --   --   --   AST 15  --   --   --   --   ALT 12  --   --   --   --   ALKPHOS 59  --   --   --   --   BILITOT 2.2*  --   --   --   --   GFRNONAA >60   < > >60 >60 >60  ANIONGAP 22*   < > 5 5 6    < > = values in this interval not displayed.     Hematology Recent Labs  Lab 12/10/20 0318 12/11/20 0138 12/12/20 0532  WBC 4.5 4.5 4.8  RBC 4.21 3.67* 3.78*  HGB 12.5 10.9* 11.1*  HCT 35.7* 31.5* 33.6*  MCV 84.8 85.8 88.9  MCH 29.7 29.7 29.4  MCHC 35.0 34.6 33.0  RDW 12.5 12.5 12.8  PLT 183 145* 131*    Cardiac EnzymesNo results for input(s): TROPONINI in the last 168 hours. No results for input(s): TROPIPOC in the last 168 hours.   BNP Recent Labs  Lab 12/09/20 1815  BNP 106.3*     DDimer No results for input(s): DDIMER in the last 168 hours.   Radiology    PERIPHERAL VASCULAR CATHETERIZATION  Result Date: 12/10/2020 See surgical note for result.  Cardiac Studies   2D echo 12/10/2020: 1. Left ventricular ejection fraction, by estimation, is 40 to 45%. The  left ventricle has mildly decreased function. The left ventricle  demonstrates regional wall motion abnormalities (Apical and periapical  hypokinesis, basal and mid wall motion best  preserved, consistent with possible stress cardiomyopathy though unable to  exclude ishcemia). Left ventricular diastolic parameters are consistent  with Grade II diastolic dysfunction (pseudonormalization).   2. Right ventricular systolic function is normal. The right ventricular  size is normal. Tricuspid regurgitation signal is inadequate for assessing  PA pressure.   3. The mitral valve is normal in structure. No evidence of mitral valve  regurgitation. No evidence of mitral stenosis.   4. The aortic valve is normal in structure. Aortic valve regurgitation is  not  visualized. No aortic stenosis is present.   5. The inferior vena cava is dilated in size with <50% respiratory  variability, suggesting right atrial pressure of 15 mmHg.  Patient Profile     50 y.o. female with history of DM2, HTN, and lower extremity swelling and ongoing tobacco use who is being seen today for the evaluation of abnormal echo at the request of Dr. Waldron Labs.  Assessment & Plan    1. Acute HFrEF with mildly elevated high sensitivity troponin: -No symptoms of angina or decompensation at this time -Minimally elevated high sensitivity troponin not consistent with ACS, possibly supply demand ischemia  -EKG concerning for possible prior anterior MI, leading her cardiomyopathy to possibly be ischemic in etiology  -Will need R/LHC, possibly later next week pending vascular surgery intervention for right lower extremity angiography  -Escalate GDMT as tolerated, initially, relative hypotension precluded addition of evidence-based medical therapy -BP now on the higher side -Add Coreg 3.125 mg bid to see how BP trends  -Daily weights -Strict I/O  2. PAD with critical limb ischemia: -Status post left-sided-sided intervention on 11/16 -Plans for right-sided intervention, possibly early next week -ASA, Plavix, Lipitor  -Management per vascular surgery  3. Tobacco use: -Complete cessation is recommended    4. Hypokalemia: -Repleted -Magnesium at goal   5. Positional dizziness: -Resolved   For questions or updates, please contact [place] Please consult [email or web address] for contact info under Cardiology/STEMI.    Signed, Christell Faith, PA-C [place] Pager: [phone] 12/12/2020, 8:50 AM

## 2020-12-12 NOTE — Progress Notes (Signed)
PROGRESS NOTE    Beth Carroll  JME:[id number] DOB: 22-Feb-1970 DOA: 12/09/2020 PCP: Baxter Hire, MD    Chief Complaint  Patient presents with   Edema    Brief Narrative:    Beth Carroll is a 50 y.o. female with medical history significant for DM, HTN, bilateral lower extremity edema for the past 3 months with negative venous doppler on 10/26, and with little response to empiric Lasix, compression elevated who presents to the emergency room with a several day history of nausea, poor appetite and dizziness on standing.  She continues to take her Lasix as prescribed.  She also reports bilateral lower extremity pain with ambulation that improves with rest as well as rest pain.  She denies fever or chills.  Denies chest pain no shortness of breath or cough.  Denies abdominal pain.     ABIs done in the ED 0.3, correlating with decreased capillary refill.  The ED provider contacted vascular surgeon Dr. Lucky Cowboy who was agreeable with heparin anticoagulation in preparation for angiogram in the a.m. Potassium repleted orally in the ED    Assessment & Plan:   Principal Problem:   Postural dizziness with presyncope Active Problems:   Hypertension   Rest pain of both lower extremities due to atherosclerosis (HCC)   Hyperglycemia due to type 2 diabetes mellitus (HCC)   Hypokalemia   Bilateral lower extremity edema   Severe peripheral arterial disease with rest pain -Her ABI in the critical range, she is with severe dependent edema. -Vascular surgery consult greatly appreciated, patient S/P   1.  Ultrasound guidance for vascular access right femoral artery             2.  Catheter placement into left common femoral artery from right femoral approach             3.  Aortogram and selective left lower extremity angiogram             4.  Percutaneous transluminal angioplasty of left anterior tibial artery with 2.5 mm angioplasty balloon             5.  Percutaneous transluminal angioplasty  of left posterior tibial artery with 2.5 mm diameter angioplasty balloon             6.  Mechanical thrombectomy to the left SFA and popliteal arteries with the Rota Rex device to debulk chronic thrombus             7.  Angioplasty of the left SFA and popliteal arteries with 4 mm diameter by 30 cm length and 4 mm diameter by 15 cm length Lutonix drug-coated angioplasty balloons             8.  Viabahn stent placement x2 to the left SFA and above-knee popliteal arteries with a 6 mm diameter by 25 cm length stent and 6 mm diameter by 7.5 cm length stent             9.  StarClose closure device right femoral artery  -Further management per vascular surgery, she remains on heparin GTT, plan for angiogram of right lower extremity with possible intervention, neurosurgery would like to wait for another 48 to 72 hours to clear the dye load before further intervention  -On heparin GTT.  Acute systolic CHF, -Cardiology input greatly appreciated, EKG concerning for possible prior anterior MI. -Likely will need diagnostic cath, timing per cardiology after discussing with vascular surgery      Postural dizziness with presyncope -  Continue to monitor on telemetry.    Hyperglycemia due to type 2 diabetes mellitus (HCC) - Sliding scale insulin coverage, resume home Lantus -CBGs poorly controlled, this is mildly as she did not receive her long-acting insulin as she was on procedure yesterday, will resume today at home dose of 25 units.   Severe hypokalemia -Repleted, continue to monitor closely  Tobacco abuse -She was counseled.     Hypertension - Continue antihypertensives     DVT prophylaxis: heparin GTT Code Status: Full Family Communication: none at bedside Disposition:   Status is: Inpatient  Remains inpatient appropriate because: Vascular intervention, on heparin GTT       Consultants:  Vascular surgery  Cardiology  Subjective:  Patient report left lower extremity pain has  improved. Objective: Vitals:   12/12/20 0459 12/12/20 0727 12/12/20 0755 12/12/20 1150  BP: (!) 143/80  (!) 151/90 (!) 162/93  Pulse: 80  76 80  Resp: 18 11 18 19   Temp: 98.3 F (36.8 C)  98.6 F (37 C) 97.9 F (36.6 C)  TempSrc:      SpO2: 96%  100% 92%  Weight:      Height:        Intake/Output Summary (Last 24 hours) at 12/12/2020 1414 Last data filed at 12/12/2020 1340 Gross per 24 hour  Intake 480 ml  Output 1425 ml  Net -945 ml   Filed Weights   12/10/20 0244 12/11/20 0406  Weight: 49.7 kg 49.7 kg    Examination:  Awake Alert, Oriented X 3, No new F.N deficits, Normal affect Symmetrical Chest wall movement, Good air movement bilaterally, CTAB RRR,No Gallops,Rubs or new Murmurs, No Parasternal Heave +ve B.Sounds, Abd Soft, No tenderness, No rebound - guarding or rigidity. Trace bilateral lower extremity edema    Data Reviewed: I have personally reviewed following labs and imaging studies  CBC: Recent Labs  Lab 12/09/20 1501 12/10/20 0318 12/11/20 0138 12/12/20 0532  WBC 4.7 4.5 4.5 4.8  HGB 13.5 12.5 10.9* 11.1*  HCT 39.2 35.7* 31.5* 33.6*  MCV 84.1 84.8 85.8 88.9  PLT 230 183 145* 131*    Basic Metabolic Panel: Recent Labs  Lab 12/10/20 0318 12/10/20 0458 12/10/20 0509 12/10/20 1840 12/11/20 0138 12/12/20 0532  NA 134* 137  --  137 133* 138  K 3.0* 2.6*  --  3.1* 3.1* 4.0  CL 95* 100  --  102 101 103  CO2 25 28  --  30 27 29   GLUCOSE 453* 343*  --  320* 477* 183*  BUN 22* 20  --  19 17 12   CREATININE 0.81 0.65  --  0.64 0.60 0.31*  CALCIUM 8.9 8.8*  --  8.5* 8.2* 8.6*  MG  --   --  2.0  --   --   --     GFR: Estimated Creatinine Clearance: 65.3 mL/min (A) (by C-G formula based on SCr of 0.31 mg/dL (L)).  Liver Function Tests: Recent Labs  Lab 12/09/20 1501  AST 15  ALT 12  ALKPHOS 59  BILITOT 2.2*  PROT 7.4  ALBUMIN 3.9    CBG: Recent Labs  Lab 12/11/20 2058 12/12/20 0006 12/12/20 0452 12/12/20 0726 12/12/20 1151   GLUCAP 405* 278* 120* 230* 184*     Recent Results (from the past 240 hour(s))  Resp Panel by RT-PCR (Flu A&B, Covid) Nasopharyngeal Swab     Status: None   Collection Time: 12/10/20  8:20 AM   Specimen: Nasopharyngeal Swab; Nasopharyngeal(NP) swabs in vial transport  medium  Result Value Ref Range Status   SARS Coronavirus 2 by RT PCR NEGATIVE NEGATIVE Final    Comment: (NOTE) SARS-CoV-2 target nucleic acids are NOT DETECTED.  The SARS-CoV-2 RNA is generally detectable in upper respiratory specimens during the acute phase of infection. The lowest concentration of SARS-CoV-2 viral copies this assay can detect is 138 copies/mL. A negative result does not preclude SARS-Cov-2 infection and should not be used as the sole basis for treatment or other patient management decisions. A negative result may occur with  improper specimen collection/handling, submission of specimen other than nasopharyngeal swab, presence of viral mutation(s) within the areas targeted by this assay, and inadequate number of viral copies(<138 copies/mL). A negative result must be combined with clinical observations, patient history, and epidemiological information. The expected result is Negative.  Fact Sheet for Patients:  EntrepreneurPulse.com.au  Fact Sheet for Healthcare Providers:  IncredibleEmployment.be  This test is no t yet approved or cleared by the [place] FDA and  has been authorized for detection and/or diagnosis of SARS-CoV-2 by FDA under an Emergency Use Authorization (EUA). This EUA will remain  in effect (meaning this test can be used) for the duration of the COVID-19 declaration under Section 564(b)(1) of the Act, 21 U.S.C.section 360bbb-3(b)(1), unless the authorization is terminated  or revoked sooner.       Influenza A by PCR NEGATIVE NEGATIVE Final   Influenza B by PCR NEGATIVE NEGATIVE Final    Comment: (NOTE) The Xpert Xpress  SARS-CoV-2/FLU/RSV plus assay is intended as an aid in the diagnosis of influenza from Nasopharyngeal swab specimens and should not be used as a sole basis for treatment. Nasal washings and aspirates are unacceptable for Xpert Xpress SARS-CoV-2/FLU/RSV testing.  Fact Sheet for Patients: EntrepreneurPulse.com.au  Fact Sheet for Healthcare Providers: IncredibleEmployment.be  This test is not yet approved or cleared by the [place] FDA and has been authorized for detection and/or diagnosis of SARS-CoV-2 by FDA under an Emergency Use Authorization (EUA). This EUA will remain in effect (meaning this test can be used) for the duration of the COVID-19 declaration under Section 564(b)(1) of the Act, 21 U.S.C. section 360bbb-3(b)(1), unless the authorization is terminated or revoked.  Performed at [facility], [address] [place], [place] [address]          Radiology Studies: ECHOCARDIOGRAM COMPLETE  Result Date: 12/11/2020    ECHOCARDIOGRAM REPORT   Patient Name:   Beth Carroll Date of Exam: 12/10/2020 Medical Rec #:  [id number]     Height:       66.0 in Accession #:    [id number]    Weight:       109.6 lb Date of Birth:  [birth year]     BSA:          1.549 m Patient Age:    29 years      BP:           96/77 mmHg Patient Gender: F             HR:           68 bpm. Exam Location:  [facility] Procedure: 2D Echo, Cardiac Doppler, Color Doppler and Intracardiac            Opacification Agent Indications:     F81.82 Acute Diastolic CHF  History:         Patient has no prior history of Echocardiogram examinations.  Risk Factors:Diabetes and Hypertension.  Sonographer:     Cresenciano Lick RDCS Referring Phys:  [place] Diagnosing Phys: Ida Rogue MD IMPRESSIONS  1. Left ventricular ejection fraction, by estimation, is 40 to 45%. The left ventricle has mildly decreased function. The left ventricle demonstrates  regional wall motion abnormalities (Apical and periapical hypokinesis, basal and mid wall motion best preserved, consistent with possible stress cardiomyopathy though unable to exclude ishcemia). Left ventricular diastolic parameters are consistent with Grade II diastolic dysfunction (pseudonormalization).  2. Right ventricular systolic function is normal. The right ventricular size is normal. Tricuspid regurgitation signal is inadequate for assessing PA pressure.  3. The mitral valve is normal in structure. No evidence of mitral valve regurgitation. No evidence of mitral stenosis.  4. The aortic valve is normal in structure. Aortic valve regurgitation is not visualized. No aortic stenosis is present.  5. The inferior vena cava is dilated in size with <50% respiratory variability, suggesting right atrial pressure of 15 mmHg. FINDINGS  Left Ventricle: Left ventricular ejection fraction, by estimation, is 40 to 45%. The left ventricle has mildly decreased function. The left ventricle demonstrates regional wall motion abnormalities. Definity contrast agent was given IV to delineate the left ventricular endocardial borders. The left ventricular internal cavity size was normal in size. There is no left ventricular hypertrophy. Left ventricular diastolic parameters are consistent with Grade II diastolic dysfunction (pseudonormalization). Right Ventricle: The right ventricular size is normal. No increase in right ventricular wall thickness. Right ventricular systolic function is normal. Tricuspid regurgitation signal is inadequate for assessing PA pressure. Left Atrium: Left atrial size was normal in size. Right Atrium: Right atrial size was normal in size. Pericardium: There is no evidence of pericardial effusion. Mitral Valve: The mitral valve is normal in structure. No evidence of mitral valve regurgitation. No evidence of mitral valve stenosis. Tricuspid Valve: The tricuspid valve is normal in structure. Tricuspid  valve regurgitation is not demonstrated. No evidence of tricuspid stenosis. Aortic Valve: The aortic valve is normal in structure. Aortic valve regurgitation is not visualized. No aortic stenosis is present. Pulmonic Valve: The pulmonic valve was normal in structure. Pulmonic valve regurgitation is not visualized. No evidence of pulmonic stenosis. Aorta: The aortic root is normal in size and structure. Venous: The inferior vena cava is dilated in size with less than 50% respiratory variability, suggesting right atrial pressure of 15 mmHg. IAS/Shunts: No atrial level shunt detected by color flow Doppler.  LEFT VENTRICLE PLAX 2D LVIDd:         3.70 cm   Diastology LVIDs:         2.20 cm   LV e' medial:    5.55 cm/s LV PW:         0.90 cm   LV E/e' medial:  12.0 LV IVS:        0.90 cm   LV e' lateral:   8.92 cm/s LVOT diam:     2.00 cm   LV E/e' lateral: 7.4 LV SV:         80 LV SV Index:   52 LVOT Area:     3.14 cm  RIGHT VENTRICLE             IVC RV Basal diam:  3.60 cm     IVC diam: 3.00 cm RV S prime:     14.90 cm/s TAPSE (M-mode): 2.1 cm LEFT ATRIUM             Index  RIGHT ATRIUM           Index LA diam:        3.70 cm 2.39 cm/m   RA Area:     12.20 cm LA Vol (A2C):   39.8 ml 25.70 ml/m  RA Volume:   30.90 ml  19.95 ml/m LA Vol (A4C):   40.6 ml 26.22 ml/m LA Biplane Vol: 40.2 ml 25.96 ml/m  AORTIC VALVE LVOT Vmax:   123.00 cm/s LVOT Vmean:  105.000 cm/s LVOT VTI:    0.256 m  AORTA Ao Root diam: 3.00 cm MITRAL VALVE MV Area (PHT): 3.08 cm    SHUNTS MV Decel Time: 246 msec    Systemic VTI:  0.26 m MV E velocity: 66.40 cm/s  Systemic Diam: 2.00 cm MV A velocity: 57.40 cm/s MV E/A ratio:  1.16 Ida Rogue MD Electronically signed by Ida Rogue MD Signature Date/Time: 12/11/2020/8:09:01 AM    Final         Scheduled Meds:  aspirin EC  81 mg Oral Daily   atorvastatin  20 mg Oral Daily   carvedilol  3.125 mg Oral BID WC   clopidogrel  75 mg Oral Daily   insulin aspart  0-15 Units  Subcutaneous TID WC   insulin aspart  0-5 Units Subcutaneous QHS   insulin aspart  3 Units Subcutaneous TID WC   insulin glargine-yfgn  25 Units Subcutaneous Daily   Continuous Infusions:     LOS: 2 days        Phillips Climes, MD [facility]   To contact the attending provider between 7A-7P or the covering provider during after hours 7P-7A, please log into the web site [email or web address] and access using universal [place] password for that web site. If you do not have the password, please call the hospital operator.  12/12/2020, 2:14 PM

## 2020-12-12 NOTE — Progress Notes (Signed)
PROGRESS NOTE    Beth Carroll  LPF:[id number] DOB: 1970/08/23 DOA: 12/09/2020 PCP: Baxter Hire, MD    Chief Complaint  Patient presents with   Edema    Brief Narrative:    Beth Carroll is a 50 y.o. female with medical history significant for DM, HTN, bilateral lower extremity edema for the past 3 months with negative venous doppler on 10/26, and with little response to empiric Lasix, compression elevated who presents to the emergency room with a several day history of nausea, poor appetite and dizziness on standing.  She continues to take her Lasix as prescribed.  She also reports bilateral lower extremity pain with ambulation that improves with rest as well as rest pain.  She denies fever or chills.  Denies chest pain no shortness of breath or cough.  Denies abdominal pain.     ABIs done in the ED 0.3, correlating with decreased capillary refill.  The ED provider contacted vascular surgeon Dr. Lucky Cowboy who was agreeable with heparin anticoagulation in preparation for angiogram in the a.m. Potassium repleted orally in the ED    Assessment & Plan:   Principal Problem:   Postural dizziness with presyncope Active Problems:   Hypertension   Rest pain of both lower extremities due to atherosclerosis (HCC)   Hyperglycemia due to type 2 diabetes mellitus (HCC)   Hypokalemia   Bilateral lower extremity edema   Severe peripheral arterial disease with rest pain -Her ABI in the critical range, she is with severe dependent edema. -Vascular surgery consult greatly appreciated, patient S/P   1.  Ultrasound guidance for vascular access right femoral artery             2.  Catheter placement into left common femoral artery from right femoral approach             3.  Aortogram and selective left lower extremity angiogram             4.  Percutaneous transluminal angioplasty of left anterior tibial artery with 2.5 mm angioplasty balloon             5.  Percutaneous transluminal angioplasty  of left posterior tibial artery with 2.5 mm diameter angioplasty balloon             6.  Mechanical thrombectomy to the left SFA and popliteal arteries with the Rota Rex device to debulk chronic thrombus             7.  Angioplasty of the left SFA and popliteal arteries with 4 mm diameter by 30 cm length and 4 mm diameter by 15 cm length Lutonix drug-coated angioplasty balloons             8.  Viabahn stent placement x2 to the left SFA and above-knee popliteal arteries with a 6 mm diameter by 25 cm length stent and 6 mm diameter by 7.5 cm length stent             9.  StarClose closure device right femoral artery  -Further management per vascular surgery, she remains on heparin GTT, plan for angiogram of right lower extremity with possible intervention, neurosurgery would like to wait for another 48 to 72 hours to clear the dye load before further intervention  -On heparin GTT, currently transitioned to aspirin and Plavix as discussed with vascular surgery  Acute systolic CHF, -Cardiology input greatly appreciated, EKG concerning for possible prior anterior MI. -Likely will need diagnostic cath, timing per cardiology, likely late next  week. -Started on low-dose Coreg.       Postural dizziness with presyncope - Continue to monitor on telemetry.  Type 2 diabetes mellitus, poorly controlled with hyperglycemia . -A1c is elevated at 13.7  -Continue with Semglee 22 units, will add 3 units of NovoLog 3 times daily as well.,  Continue with insulin sliding scale.    Severe hypokalemia -Repleted, continue to monitor closely  Tobacco abuse -She was counseled.     Hypertension - She is not on any medications at home, now blood pressure started to increase she is on Coreg for her CHF.     DVT prophylaxis: Subcu Lovenox Code Status: Full Family Communication: none at bedside Disposition:   Status is: Inpatient  Remains inpatient appropriate because: Vascular intervention, on heparin  GTT       Consultants:  Vascular surgery  Cardiology  Subjective:  Patient report left lower extremity pain has improved.  No dyspnea, no chest pain. Objective: Vitals:   12/12/20 0727 12/12/20 0755 12/12/20 1150 12/12/20 1443  BP:  (!) 151/90 (!) 162/93 (!) 150/87  Pulse:  76 80 80  Resp: 11 18 19 18   Temp:  98.6 F (37 C) 97.9 F (36.6 C) 98 F (36.7 C)  TempSrc:      SpO2:  100% 92% 100%  Weight:      Height:        Intake/Output Summary (Last 24 hours) at 12/12/2020 1547 Last data filed at 12/12/2020 1340 Gross per 24 hour  Intake 480 ml  Output 1425 ml  Net -945 ml   Filed Weights   12/10/20 0244 12/11/20 0406  Weight: 49.7 kg 49.7 kg    Examination:  Awake Alert, Oriented X 3, No new F.N deficits, Normal affect Symmetrical Chest wall movement, Good air movement bilaterally, CTAB RRR,No Gallops,Rubs or new Murmurs, No Parasternal Heave +ve B.Sounds, Abd Soft, No tenderness, No rebound - guarding or rigidity. Left lower ext trace edema     Data Reviewed: I have personally reviewed following labs and imaging studies  CBC: Recent Labs  Lab 12/09/20 1501 12/10/20 0318 12/11/20 0138 12/12/20 0532  WBC 4.7 4.5 4.5 4.8  HGB 13.5 12.5 10.9* 11.1*  HCT 39.2 35.7* 31.5* 33.6*  MCV 84.1 84.8 85.8 88.9  PLT 230 183 145* 131*    Basic Metabolic Panel: Recent Labs  Lab 12/10/20 0318 12/10/20 0458 12/10/20 0509 12/10/20 1840 12/11/20 0138 12/12/20 0532  NA 134* 137  --  137 133* 138  K 3.0* 2.6*  --  3.1* 3.1* 4.0  CL 95* 100  --  102 101 103  CO2 25 28  --  30 27 29   GLUCOSE 453* 343*  --  320* 477* 183*  BUN 22* 20  --  19 17 12   CREATININE 0.81 0.65  --  0.64 0.60 0.31*  CALCIUM 8.9 8.8*  --  8.5* 8.2* 8.6*  MG  --   --  2.0  --   --   --     GFR: Estimated Creatinine Clearance: 65.3 mL/min (A) (by C-G formula based on SCr of 0.31 mg/dL (L)).  Liver Function Tests: Recent Labs  Lab 12/09/20 1501  AST 15  ALT 12  ALKPHOS 59   BILITOT 2.2*  PROT 7.4  ALBUMIN 3.9    CBG: Recent Labs  Lab 12/11/20 2058 12/12/20 0006 12/12/20 0452 12/12/20 0726 12/12/20 1151  GLUCAP 405* 278* 120* 230* 184*     Recent Results (from the past 240 hour(s))  Resp Panel by RT-PCR (Flu A&B, Covid) Nasopharyngeal Swab     Status: None   Collection Time: 12/10/20  8:20 AM   Specimen: Nasopharyngeal Swab; Nasopharyngeal(NP) swabs in vial transport medium  Result Value Ref Range Status   SARS Coronavirus 2 by RT PCR NEGATIVE NEGATIVE Final    Comment: (NOTE) SARS-CoV-2 target nucleic acids are NOT DETECTED.  The SARS-CoV-2 RNA is generally detectable in upper respiratory specimens during the acute phase of infection. The lowest concentration of SARS-CoV-2 viral copies this assay can detect is 138 copies/mL. A negative result does not preclude SARS-Cov-2 infection and should not be used as the sole basis for treatment or other patient management decisions. A negative result may occur with  improper specimen collection/handling, submission of specimen other than nasopharyngeal swab, presence of viral mutation(s) within the areas targeted by this assay, and inadequate number of viral copies(<138 copies/mL). A negative result must be combined with clinical observations, patient history, and epidemiological information. The expected result is Negative.  Fact Sheet for Patients:  EntrepreneurPulse.com.au  Fact Sheet for Healthcare Providers:  IncredibleEmployment.be  This test is no t yet approved or cleared by the [place] FDA and  has been authorized for detection and/or diagnosis of SARS-CoV-2 by FDA under an Emergency Use Authorization (EUA). This EUA will remain  in effect (meaning this test can be used) for the duration of the COVID-19 declaration under Section 564(b)(1) of the Act, 21 U.S.C.section 360bbb-3(b)(1), unless the authorization is terminated  or revoked sooner.        Influenza A by PCR NEGATIVE NEGATIVE Final   Influenza B by PCR NEGATIVE NEGATIVE Final    Comment: (NOTE) The Xpert Xpress SARS-CoV-2/FLU/RSV plus assay is intended as an aid in the diagnosis of influenza from Nasopharyngeal swab specimens and should not be used as a sole basis for treatment. Nasal washings and aspirates are unacceptable for Xpert Xpress SARS-CoV-2/FLU/RSV testing.  Fact Sheet for Patients: EntrepreneurPulse.com.au  Fact Sheet for Healthcare Providers: IncredibleEmployment.be  This test is not yet approved or cleared by the [place] FDA and has been authorized for detection and/or diagnosis of SARS-CoV-2 by FDA under an Emergency Use Authorization (EUA). This EUA will remain in effect (meaning this test can be used) for the duration of the COVID-19 declaration under Section 564(b)(1) of the Act, 21 U.S.C. section 360bbb-3(b)(1), unless the authorization is terminated or revoked.  Performed at [facility], [address] [place], [place] [address]          Radiology Studies: ECHOCARDIOGRAM COMPLETE  Result Date: 12/11/2020    ECHOCARDIOGRAM REPORT   Patient Name:   Beth Carroll Date of Exam: 12/10/2020 Medical Rec #:  [id number]     Height:       66.0 in Accession #:    [id number]    Weight:       109.6 lb Date of Birth:  [birth year]     BSA:          1.549 m Patient Age:    11 years      BP:           96/77 mmHg Patient Gender: F             HR:           68 bpm. Exam Location:  [facility] Procedure: 2D Echo, Cardiac Doppler, Color Doppler and Intracardiac            Opacification Agent Indications:     I50.31 Acute  Diastolic CHF  History:         Patient has no prior history of Echocardiogram examinations.                  Risk Factors:Diabetes and Hypertension.  Sonographer:     Cresenciano Lick RDCS Referring Phys:  [place] Diagnosing Phys: Ida Rogue MD IMPRESSIONS  1. Left  ventricular ejection fraction, by estimation, is 40 to 45%. The left ventricle has mildly decreased function. The left ventricle demonstrates regional wall motion abnormalities (Apical and periapical hypokinesis, basal and mid wall motion best preserved, consistent with possible stress cardiomyopathy though unable to exclude ishcemia). Left ventricular diastolic parameters are consistent with Grade II diastolic dysfunction (pseudonormalization).  2. Right ventricular systolic function is normal. The right ventricular size is normal. Tricuspid regurgitation signal is inadequate for assessing PA pressure.  3. The mitral valve is normal in structure. No evidence of mitral valve regurgitation. No evidence of mitral stenosis.  4. The aortic valve is normal in structure. Aortic valve regurgitation is not visualized. No aortic stenosis is present.  5. The inferior vena cava is dilated in size with <50% respiratory variability, suggesting right atrial pressure of 15 mmHg. FINDINGS  Left Ventricle: Left ventricular ejection fraction, by estimation, is 40 to 45%. The left ventricle has mildly decreased function. The left ventricle demonstrates regional wall motion abnormalities. Definity contrast agent was given IV to delineate the left ventricular endocardial borders. The left ventricular internal cavity size was normal in size. There is no left ventricular hypertrophy. Left ventricular diastolic parameters are consistent with Grade II diastolic dysfunction (pseudonormalization). Right Ventricle: The right ventricular size is normal. No increase in right ventricular wall thickness. Right ventricular systolic function is normal. Tricuspid regurgitation signal is inadequate for assessing PA pressure. Left Atrium: Left atrial size was normal in size. Right Atrium: Right atrial size was normal in size. Pericardium: There is no evidence of pericardial effusion. Mitral Valve: The mitral valve is normal in structure. No evidence of  mitral valve regurgitation. No evidence of mitral valve stenosis. Tricuspid Valve: The tricuspid valve is normal in structure. Tricuspid valve regurgitation is not demonstrated. No evidence of tricuspid stenosis. Aortic Valve: The aortic valve is normal in structure. Aortic valve regurgitation is not visualized. No aortic stenosis is present. Pulmonic Valve: The pulmonic valve was normal in structure. Pulmonic valve regurgitation is not visualized. No evidence of pulmonic stenosis. Aorta: The aortic root is normal in size and structure. Venous: The inferior vena cava is dilated in size with less than 50% respiratory variability, suggesting right atrial pressure of 15 mmHg. IAS/Shunts: No atrial level shunt detected by color flow Doppler.  LEFT VENTRICLE PLAX 2D LVIDd:         3.70 cm   Diastology LVIDs:         2.20 cm   LV e' medial:    5.55 cm/s LV PW:         0.90 cm   LV E/e' medial:  12.0 LV IVS:        0.90 cm   LV e' lateral:   8.92 cm/s LVOT diam:     2.00 cm   LV E/e' lateral: 7.4 LV SV:         80 LV SV Index:   52 LVOT Area:     3.14 cm  RIGHT VENTRICLE             IVC RV Basal diam:  3.60 cm  IVC diam: 3.00 cm RV S prime:     14.90 cm/s TAPSE (M-mode): 2.1 cm LEFT ATRIUM             Index        RIGHT ATRIUM           Index LA diam:        3.70 cm 2.39 cm/m   RA Area:     12.20 cm LA Vol (A2C):   39.8 ml 25.70 ml/m  RA Volume:   30.90 ml  19.95 ml/m LA Vol (A4C):   40.6 ml 26.22 ml/m LA Biplane Vol: 40.2 ml 25.96 ml/m  AORTIC VALVE LVOT Vmax:   123.00 cm/s LVOT Vmean:  105.000 cm/s LVOT VTI:    0.256 m  AORTA Ao Root diam: 3.00 cm MITRAL VALVE MV Area (PHT): 3.08 cm    SHUNTS MV Decel Time: 246 msec    Systemic VTI:  0.26 m MV E velocity: 66.40 cm/s  Systemic Diam: 2.00 cm MV A velocity: 57.40 cm/s MV E/A ratio:  1.16 Ida Rogue MD Electronically signed by Ida Rogue MD Signature Date/Time: 12/11/2020/8:09:01 AM    Final         Scheduled Meds:  aspirin EC  81 mg Oral Daily    atorvastatin  20 mg Oral Daily   carvedilol  3.125 mg Oral BID WC   clopidogrel  75 mg Oral Daily   insulin aspart  0-15 Units Subcutaneous TID WC   insulin aspart  0-5 Units Subcutaneous QHS   insulin aspart  3 Units Subcutaneous TID WC   insulin glargine-yfgn  25 Units Subcutaneous Daily   Continuous Infusions:     LOS: 2 days        Phillips Climes, MD [facility]   To contact the attending provider between 7A-7P or the covering provider during after hours 7P-7A, please log into the web site [email or web address] and access using universal [place] password for that web site. If you do not have the password, please call the hospital operator.  12/12/2020, 3:47 PM

## 2020-12-12 NOTE — Progress Notes (Signed)
Pt called out requesting morphine at about 2135 - Pulled morphine but upon entering the pts room, she was asleep. Had discussed earlier during assessment whether the pt wanted to be woken up from sleeping to evaluate pain as soon as medication was available to be given again per PRN orders, which she declined - "just let me sleep and I'll call out if I need it." In light of this, at this time I opted to let the pt continue to sleep, as she had received PO pain mgmt less than an hour ago and she appeared comfortable and VSS. Morphine was returned to the pyxis with Ronny Bacon RN witnessing.

## 2020-12-12 NOTE — Progress Notes (Addendum)
Inpatient Diabetes Program Recommendations  AACE/ADA: New Consensus Statement on Inpatient Glycemic Control (2015)  Target Ranges:  Prepandial:   less than 140 mg/dL      Peak postprandial:   less than 180 mg/dL (1-2 hours)      Critically ill patients:  140 - 180 mg/dL    Latest Reference Range & Units 12/11/20 00:52 12/11/20 02:40 12/11/20 04:01 12/11/20 08:07 12/11/20 11:26 12/11/20 17:00 12/11/20 20:58  Glucose-Capillary 70 - 99 mg/dL 471 (H)  15 units Novolog  361 (H) 269 (H)  8 units Novolog  128 (H)  2 units Novolog  186 (H)  3 units Novolog  25 units Semglee @1001  112 (H) 405 (H)  15 units Novolog     Latest Reference Range & Units 12/12/20 00:06 12/12/20 04:52 12/12/20 07:26  Glucose-Capillary 70 - 99 mg/dL 278 (H)  8 units Novolog  120 (H) 230 (H)     Home DM Meds: Lantus 25 units daily   Current Orders: Semglee 25 units Daily                            Novolog Moderate Correction Scale/ SSI (0-15 units) Q4 hours    MD- Please consider:  1. Change Novolog SSI to TID AC + HS (currently ordered Q4 hours and pt now eating PO diet)  2. Start Novolog Meal Coverage: Novolog 3 units TID with meals Hold if pt eats <50% of meal, Hold if pt NPO    Please also consider giving pt a Novolog SSi to use TID with meals at home.  Current A1c= 13.7%.  Has appt with [facility] March 26, 2021 but may need Novolog SSi at home to help better manage CBGs until her 1st appt with ENDO.  Pt prefers insulin pens.   Pt also requesting refills on her Lancets and CBG meter strips.     --Will follow patient during hospitalization--  Wyn Quaker RN, MSN, CDE Diabetes Coordinator Inpatient Glycemic Control Team Team Pager: [phone] (8a-5p)

## 2020-12-13 DIAGNOSIS — I70223 Atherosclerosis of native arteries of extremities with rest pain, bilateral legs: Secondary | ICD-10-CM

## 2020-12-13 DIAGNOSIS — I998 Other disorder of circulatory system: Secondary | ICD-10-CM | POA: Diagnosis not present

## 2020-12-13 DIAGNOSIS — R600 Localized edema: Secondary | ICD-10-CM | POA: Diagnosis not present

## 2020-12-13 DIAGNOSIS — I420 Dilated cardiomyopathy: Secondary | ICD-10-CM

## 2020-12-13 DIAGNOSIS — I5021 Acute systolic (congestive) heart failure: Secondary | ICD-10-CM | POA: Diagnosis not present

## 2020-12-13 DIAGNOSIS — I739 Peripheral vascular disease, unspecified: Secondary | ICD-10-CM

## 2020-12-13 LAB — GLUCOSE, CAPILLARY
Glucose-Capillary: 219 mg/dL — ABNORMAL HIGH (ref 70–99)
Glucose-Capillary: 161 mg/dL — ABNORMAL HIGH (ref 70–99)
Glucose-Capillary: 155 mg/dL — ABNORMAL HIGH (ref 70–99)
Glucose-Capillary: 119 mg/dL — ABNORMAL HIGH (ref 70–99)

## 2020-12-13 LAB — BASIC METABOLIC PANEL
Anion gap: 5 (ref 5–15)
BUN: 10 mg/dL (ref 6–20)
CO2: 30 mmol/L (ref 22–32)
Calcium: 8.5 mg/dL — ABNORMAL LOW (ref 8.9–10.3)
Chloride: 99 mmol/L (ref 98–111)
Creatinine, Ser: 0.4 mg/dL — ABNORMAL LOW (ref 0.44–1.00)
GFR, Estimated: >60 mL/min (ref >60)
Glucose, Bld: 238 mg/dL — ABNORMAL HIGH (ref 70–99)
Potassium: 3.7 mmol/L (ref 3.5–5.1)
Sodium: 134 mmol/L — ABNORMAL LOW (ref 135–145)

## 2020-12-13 LAB — CBC
HCT: 30.5 % — ABNORMAL LOW (ref 36.0–46.0)
Hemoglobin: 9.9 g/dL — ABNORMAL LOW (ref 12.0–15.0)
MCH: 28.8 pg (ref 26.0–34.0)
MCHC: 32.5 g/dL (ref 30.0–36.0)
MCV: 88.7 fL (ref 80.0–100.0)
Platelets: 125 10*3/uL — ABNORMAL LOW (ref 150–400)
RBC: 3.44 MIL/uL — ABNORMAL LOW (ref 3.87–5.11)
RDW: 12.9 % (ref 11.5–15.5)
WBC: 4.7 10*3/uL (ref 4.0–10.5)
nRBC: 0 % (ref 0.0–0.2)

## 2020-12-13 NOTE — Evaluation (Signed)
Occupational Therapy Evaluation Patient Details Name: Beth Carroll MRN: [id number] DOB: [birth year] Today's Date: 12/13/2020   History of Present Illness 50 year old female admitted with postural dizziness with presyncope and c/o BLE edema over the past 3 months. Negative doppler on 10/26 with little response to lasix, compression, and elevation; recently completed course of doxycycline for possible cellulitis. Increased BLE pain during ambulation that improves with rest. S/p angiogram on 11/16 of LLE with plans for angiogram of RLE. Significant PMH includes: DM, HTN, PAD, chronic smoker.   Clinical Impression   Chart reviewed, pt greeted in bedside chair, alert and oriented, agreeable to OT evaluation.  PTA pt is mod I-I in all ADL/IADL, works, drives, cooks, Lexicographer, Catering manager. During evaluation, pt completed all ADL tasks/functional mobility with supervision, limited by pain in LLE. Education provided re: task modification to accommodate for pain, home safety. Pt showed carry over of skills during tx session. At this time, no further OT is recommended. Please re-consult if there is a change in functional status. Pt is in agreement. Pt is left as received, NAD, all needs met.      Recommendations for follow up therapy are one component of a multi-disciplinary discharge planning process, led by the attending physician.  Recommendations may be updated based on patient status, additional functional criteria and insurance authorization.   Follow Up Recommendations  No OT follow up    Assistance Recommended at Discharge PRN  Functional Status Assessment  Patient has had a recent decline in their functional status and demonstrates the ability to make significant improvements in function in a reasonable and predictable amount of time.  Equipment Recommendations  None recommended by OT    Recommendations for Other Services       Precautions / Restrictions Precautions Precautions:  Fall Restrictions Weight Bearing Restrictions: No      Mobility Bed Mobility Overal bed mobility: Modified Independent             General bed mobility comments: recieved in chair    Transfers Overall transfer level: Needs assistance Equipment used: Rolling walker (2 wheels) Transfers: Sit to/from Stand;Bed to chair/wheelchair/BSC Sit to Stand: Supervision Stand pivot transfers: Supervision         General transfer comment: no vcs required for safety      Balance Overall balance assessment: Needs assistance Sitting-balance support: No upper extremity supported;Feet supported Sitting balance-Leahy Scale: Good Sitting balance - Comments: Able to don socks EOB without LOB   Standing balance support: During functional activity;Bilateral upper extremity supported Standing balance-Leahy Scale: Fair Standing balance comment: in RW, due to limited WB through LLE secondary to pain                           ADL either performed or assessed with clinical judgement   ADL Overall ADL's : Needs assistance/impaired Eating/Feeding: Independent   Grooming: Wash/dry face;Wash/dry hands;Standing;Supervision/safety               Lower Body Dressing: Modified independent Lower Body Dressing Details (indicate cue type and reason): socks     Toileting- Clothing Manipulation and Hygiene: Supervision/safety Toileting - Clothing Manipulation Details (indicate cue type and reason): simulated Tub/ Shower Transfer: Therapist, sports Details (indicate cue type and reason): simulated Functional mobility during ADLs: Supervision/safety;Rolling walker (2 wheels)       Vision Patient Visual Report: No change from baseline       Perception     Praxis  Pertinent Vitals/Pain Pain Assessment: 0-10 Pain Score: 8  Pain Location: left foot Pain Descriptors / Indicators: Aching Pain Intervention(s): Limited activity within patient's  tolerance;Repositioned;Premedicated before session;Monitored during session     Hand Dominance Right   Extremity/Trunk Assessment Upper Extremity Assessment Upper Extremity Assessment: Overall WFL for tasks assessed   Lower Extremity Assessment Lower Extremity Assessment: Overall WFL for tasks assessed   Cervical / Trunk Assessment Cervical / Trunk Assessment: Normal   Communication Communication Communication: No difficulties   Cognition Arousal/Alertness: Awake/alert Behavior During Therapy: WFL for tasks assessed/performed Overall Cognitive Status: Within Functional Limits for tasks assessed                                 General Comments: alert and oriented x4, good safety awareness     General Comments  edema BLE- L>R    Exercises Other Exercises Other Exercises: education re: role of OT, DC recommendations, home safety, ease of ADL completion. Verbalized understanding and demonstrated carry over. Other Exercises: Pt educated re: PT role/POC, DC recommendations, safety with mobility, energy conservation, edema management, pain management, gait with RW, and ambulating to/from bathroom with nursing. She verbalized understanding.   Shoulder Instructions      Home Living Family/patient expects to be discharged to:: Private residence Living Arrangements: Spouse/significant other Available Help at Discharge: Available PRN/intermittently Type of Home: Mobile home Home Access: Stairs to enter Entrance Stairs-Number of Steps: 3 Entrance Stairs-Rails: Can reach both Home Layout: One level     Bathroom Shower/Tub: Teacher, early years/pre: Standard     Home Equipment: None          Prior Functioning/Environment Prior Level of Function : Independent/Modified Independent;Working/employed;Driving             Mobility Comments: MOD I-I ADLs Comments: pt reports independent in ADL/IADL PTA; works in a factory        OT Problem List:  Impaired balance (sitting and/or standing);Decreased activity tolerance      OT Treatment/Interventions:      OT Goals(Current goals can be found in the care plan section) Acute Rehab OT Goals Patient Stated Goal: to feel better OT Goal Formulation: With patient Time For Goal Achievement: 12/26/20 Potential to Achieve Goals: Good  OT Frequency:     Barriers to D/C:            Co-evaluation              AM-PAC OT "6 Clicks" Daily Activity     Outcome Measure Help from another person eating meals?: None Help from another person taking care of personal grooming?: None Help from another person toileting, which includes using toliet, bedpan, or urinal?: None Help from another person bathing (including washing, rinsing, drying)?: A Little Help from another person to put on and taking off regular upper body clothing?: None Help from another person to put on and taking off regular lower body clothing?: None 6 Click Score: 23   End of Session Equipment Utilized During Treatment: Gait belt;Rolling walker (2 wheels)  Activity Tolerance: Patient tolerated treatment well Patient left: in chair;with call bell/phone within reach  OT Visit Diagnosis: Unsteadiness on feet (R26.81)                Time: 6283-6629 OT Time Calculation (min): 28 min Charges:  OT General Charges $OT Visit: 1 Visit OT Evaluation $OT Eval Low Complexity: 1 Low OT Treatments $Self Care/Home  Management : 8-22 mins  Shanon Payor, OTD OTR/L  12/13/20, 12:53 PM

## 2020-12-13 NOTE — Progress Notes (Signed)
Progress Note  Patient Name: Careli Luzader Date of Encounter: 12/13/2020  [facility] Cardiologist:New  Subjective   Hgb drop from 11.1>9.9. She is off heparin drip. She denies chest pain or SOB. No further dizziness. Plan for cath on Monday.   Inpatient Medications    Scheduled Meds:  aspirin EC  81 mg Oral Daily   atorvastatin  20 mg Oral Daily   carvedilol  3.125 mg Oral BID WC   clopidogrel  75 mg Oral Daily   enoxaparin (LOVENOX) injection  40 mg Subcutaneous Q24H   insulin aspart  0-15 Units Subcutaneous TID WC   insulin aspart  0-5 Units Subcutaneous QHS   insulin aspart  3 Units Subcutaneous TID WC   insulin glargine-yfgn  25 Units Subcutaneous Daily   Continuous Infusions:  PRN Meds: acetaminophen **OR** acetaminophen, hydrALAZINE, HYDROcodone-acetaminophen, morphine injection, ondansetron **OR** ondansetron (ZOFRAN) IV   Vital Signs    Vitals:   12/12/20 2049 12/13/20 0503 12/13/20 0531 12/13/20 0727  BP: 128/71  (!) 158/82 (!) 160/97  Pulse: 78  76 79  Resp:   16 18  Temp: 98.6 F (37 C)  98.4 F (36.9 C) 97.8 F (36.6 C)  TempSrc:      SpO2: 100%  100% 100%  Weight:  48.6 kg    Height:        Intake/Output Summary (Last 24 hours) at 12/13/2020 0822 Last data filed at 12/13/2020 0727 Gross per 24 hour  Intake 480 ml  Output 1725 ml  Net -1245 ml   Last 3 Weights 12/13/2020 12/11/2020 12/10/2020  Weight (lbs) 107 lb 2.3 oz 109 lb 9.1 oz 109 lb 9.1 oz  Weight (kg) 48.6 kg 49.7 kg 49.7 kg      Telemetry    NSR, HR 70s - Personally Reviewed  ECG    No new - Personally Reviewed  Physical Exam   GEN: No acute distress.   Neck: No JVD Cardiac: RRR, no murmurs, rubs, or gallops.  Respiratory: Clear to auscultation bilaterally. GI: Soft, nontender, non-distended  MS: No edema; No deformity. Neuro:  Nonfocal  Psych: Normal affect   Labs    High Sensitivity Troponin:   Recent Labs  Lab 12/09/20 1501 12/09/20 1815  TROPONINIHS  28* 27*     Chemistry Recent Labs  Lab 12/09/20 1501 12/09/20 2056 12/10/20 0509 12/10/20 1840 12/11/20 0138 12/12/20 0532 12/13/20 0352  NA 132*   < >  --    < > 133* 138 134*  K 2.4*   < >  --    < > 3.1* 4.0 3.7  CL 87*   < >  --    < > 101 103 99  CO2 23   < >  --    < > 27 29 30   GLUCOSE 392*   < >  --    < > 477* 183* 238*  BUN 25*   < >  --    < > 17 12 10   CREATININE 0.92   < >  --    < > 0.60 0.31* 0.40*  CALCIUM 9.2   < >  --    < > 8.2* 8.6* 8.5*  MG  --   --  2.0  --   --   --   --   PROT 7.4  --   --   --   --   --   --   ALBUMIN 3.9  --   --   --   --   --   --  AST 15  --   --   --   --   --   --   ALT 12  --   --   --   --   --   --   ALKPHOS 59  --   --   --   --   --   --   BILITOT 2.2*  --   --   --   --   --   --   GFRNONAA >60   < >  --    < > >60 >60 >60  ANIONGAP 22*   < >  --    < > 5 6 5    < > = values in this interval not displayed.    Lipids  Recent Labs  Lab 12/12/20 0532  CHOL 120  TRIG 59  HDL 45  LDLCALC 63  CHOLHDL 2.7    Hematology Recent Labs  Lab 12/11/20 0138 12/12/20 0532 12/13/20 0352  WBC 4.5 4.8 4.7  RBC 3.67* 3.78* 3.44*  HGB 10.9* 11.1* 9.9*  HCT 31.5* 33.6* 30.5*  MCV 85.8 88.9 88.7  MCH 29.7 29.4 28.8  MCHC 34.6 33.0 32.5  RDW 12.5 12.8 12.9  PLT 145* 131* 125*   Thyroid No results for input(s): TSH, FREET4 in the last 168 hours.  BNP Recent Labs  Lab 12/09/20 1815  BNP 106.3*    DDimer No results for input(s): DDIMER in the last 168 hours.   Radiology    No results found.  Cardiac Studies   2D echo 12/10/2020: 1. Left ventricular ejection fraction, by estimation, is 40 to 45%. The  left ventricle has mildly decreased function. The left ventricle  demonstrates regional wall motion abnormalities (Apical and periapical  hypokinesis, basal and mid wall motion best  preserved, consistent with possible stress cardiomyopathy though unable to  exclude ishcemia). Left ventricular diastolic parameters  are consistent  with Grade II diastolic dysfunction (pseudonormalization).   2. Right ventricular systolic function is normal. The right ventricular  size is normal. Tricuspid regurgitation signal is inadequate for assessing  PA pressure.   3. The mitral valve is normal in structure. No evidence of mitral valve  regurgitation. No evidence of mitral stenosis.   4. The aortic valve is normal in structure. Aortic valve regurgitation is  not visualized. No aortic stenosis is present.   5. The inferior vena cava is dilated in size with <50% respiratory  variability, suggesting right atrial pressure of 15 mmHg.  Patient Profile     50 y.o. female with history of DM2, HTN, and lower extremity swelling and ongoing tobacco use who is being seen today for the evaluation of abnormal echo.  Assessment & Plan    Acute HFrEF with mildly elevated HS trop - HS trop elevated to  28, not consistent with ACS - No anginal symptoms reported - Echo showed LVEF 40-45%, WMA, G2DD, normal RV function,  - Unclear etiology for cardiomyopathy, Ischemic vs stress induced - Started on Coreg 3.125mg BID - Plan for R/L heart cath Monday - continue ASA, plavix, Lipitor  PAD with critical limb ischemia - s/p left sided intervention with plans for right sided intervention next week on Wednesday - management per VVS - continue ASA, plavix and lipitor  Tobacco use - cessation advised  For questions or updates, please contact [facility] Please consult [email or web address] for contact info under        Signed, Rahman Ferrall Ninfa Meeker, PA-C  12/13/2020, 8:22 AM

## 2020-12-13 NOTE — Evaluation (Signed)
Physical Therapy Evaluation Patient Details Name: Beth Carroll MRN: [id number] DOB: September 19, 1970 Today's Date: 12/13/2020  History of Present Illness  50 year old female admitted with postural dizziness with presyncope and c/o BLE edema over the past 3 months. Negative doppler on 10/26 with little response to lasix, compression, and elevation; recently completed course of doxycycline for possible cellulitis. Increased BLE pain during ambulation that improves with rest. S/p angiogram on 11/16 of LLE with plans for angiogram of RLE. Significant PMH includes: DM, HTN, PAD, chronic smoker.   Clinical Impression  Pt is a 50 year old F admitted to hospital on 12/09/20 for postural dizziness and presyncope. At baseline, pt is Ind with ADL's, IADL's, community ambulation without AD, driving, medication management, and working full time. Pt presents with weakness of BLE (likely due to pain, L>R), paresthesia of LLE, erythema/edema of LLE, decreased standing balance, decreased activity tolerance, and increased pain levels, resulting in impaired functional mobility from baseline. Due to deficits, pt required mod I for bed mobility, supervision for transfers, and supervision-CGA for gait with RW. Due to increased pain levels, pt demonstrates LLE TTWB during gait in RW, which increase her risk of falls and reliance on RW for safe mobility. Deficits limit the pt's ability to safely and independently perform ADL's, transfer, and ambulate. Pt will benefit from acute skilled PT services to address deficits for return to baseline function. At this time, PT recommends HHPT with PRN assist at home, to improve overall safety with functional mobility for return to baseline function. Pt will also benefit from RW and 3in1 for improved safety and energy conservation with gait and ADL's at DC. Pt agreeable for DC recommendations.  Pt will benefit from ambulation with nursing staff and mobility specialist while hospitalized for  maintenance of independence and further progress towards goals.         Recommendations for follow up therapy are one component of a multi-disciplinary discharge planning process, led by the attending physician.  Recommendations may be updated based on patient status, additional functional criteria and insurance authorization.  Follow Up Recommendations Home health PT    Assistance Recommended at Discharge PRN  Functional Status Assessment Patient has had a recent decline in their functional status and demonstrates the ability to make significant improvements in function in a reasonable and predictable amount of time.  Equipment Recommendations  Rolling walker (2 wheels);BSC/3in1    Recommendations for Other Services       Precautions / Restrictions Precautions Precautions: Fall Restrictions Weight Bearing Restrictions: No      Mobility  Bed Mobility Overal bed mobility: Modified Independent             General bed mobility comments: to sit EOB with HOB elevated and use of BUE to support BLE facilitation to EOB    Transfers Overall transfer level: Needs assistance Equipment used: Rolling walker (2 wheels) Transfers: Sit to/from Stand Sit to Stand: Supervision           General transfer comment: for safety to stand from EOB with RW, limited WB through LLE secondary to pain; verbal cues for safety, sequencing, and hand placement    Ambulation/Gait Ambulation/Gait assistance: Min guard;Supervision Gait Distance (Feet): 40 Feet Assistive device: Rolling walker (2 wheels)         General Gait Details: Intially CGA for safety, progressing to supervision. Demonstrates L TTWB due to pain, step to gait pattern, decreased step length/foot clearance bil, and slowed cadence. Verbal cues to lead with LLE for improved  safety of gait with RW.     Balance Overall balance assessment: Needs assistance Sitting-balance support: No upper extremity supported;Feet  supported Sitting balance-Leahy Scale: Good Sitting balance - Comments: Able to don socks EOB without LOB   Standing balance support: During functional activity;Bilateral upper extremity supported Standing balance-Leahy Scale: Fair Standing balance comment: in RW, due to limited WB through LLE secondary to pain                             Pertinent Vitals/Pain Pain Assessment: 0-10 Pain Score: 8  Pain Location: L foot Pain Descriptors / Indicators: Aching Pain Intervention(s): Limited activity within patient's tolerance;Monitored during session;Repositioned;RN gave pain meds during session    [place] expects to be discharged to:: Private residence Living Arrangements: Spouse/significant other Available Help at Discharge: Available PRN/intermittently Type of Home: Mobile home Home Access: Stairs to enter Entrance Stairs-Rails: Can reach both Entrance Stairs-Number of Steps: 3   Home Layout: One level Home Equipment: None      Prior Function Prior Level of Function : Independent/Modified Independent;Working/employed;Driving             Mobility Comments: MOD I-I ADLs Comments: pt reports independent in ADL/IADL PTA; works in a [employer], educational Dominance   Dominant Hand: Right    Extremity/Trunk Assessment   Upper Extremity Assessment Upper Extremity Assessment: Overall WFL for tasks assessed;Defer to OT evaluation    Lower Extremity Assessment Lower Extremity Assessment: Overall WFL for tasks assessed (Not formally assessed due to increased pain levels; observed to be at least 3+/5 as pt was able to WB through BLE for transfers and gait. Increased pain of LLE; limited AROM due to pain. erythema and edema noted L>R) c/o paresthesias in LLE    Cervical / Trunk Assessment Cervical / Trunk Assessment: Normal  Communication   Communication: No difficulties  Cognition Arousal/Alertness: Awake/alert Behavior During Therapy: WFL for  tasks assessed/performed Overall Cognitive Status: Within Functional Limits for tasks assessed                                 General Comments: A&O x4; able to follow 100% of multistep commands.        General Comments General comments (skin integrity, edema, etc.): edema to bil feet (L>R); erythema to L foot. Orthostatics negative; no c/o dizziness during session.    Exercises Other Exercises Other Exercises: Participated in bed mobility, transfers, and gait. Required grossly supervision for safety with mobility; cues for safe use of RW due to limited L WB due to pain. Other Exercises: Pt educated re: PT role/POC, DC recommendations, safety with mobility, energy conservation, edema management, pain management, gait with RW, and ambulating to/from bathroom with nursing. She verbalized understanding.   Assessment/Plan    PT Assessment Patient needs continued PT services  PT Problem List Decreased strength;Decreased range of motion;Decreased activity tolerance;Decreased balance;Decreased mobility;Pain       PT Treatment Interventions Gait training;DME instruction;Stair training;Functional mobility training;Therapeutic activities;Therapeutic exercise;Balance training;Patient/family education    PT Goals (Current goals can be found in the Care Plan section)  Acute Rehab PT Goals Patient Stated Goal: "go home" PT Goal Formulation: With patient Time For Goal Achievement: 12/27/20 Potential to Achieve Goals: Good    Frequency Min 2X/week   Barriers to discharge Decreased caregiver support PRN assist at home  AM-PAC PT "6 Clicks" Mobility  Outcome Measure Help needed turning from your back to your side while in a flat bed without using bedrails?: None Help needed moving from lying on your back to sitting on the side of a flat bed without using bedrails?: None Help needed moving to and from a bed to a chair (including a wheelchair)?: A Little Help needed  standing up from a chair using your arms (e.g., wheelchair or bedside chair)?: A Little Help needed to walk in hospital room?: A Little Help needed climbing 3-5 steps with a railing? : A Little 6 Click Score: 20    End of Session Equipment Utilized During Treatment: Gait belt Activity Tolerance: Patient tolerated treatment well Patient left: in chair;with call bell/phone within reach;with chair alarm set (BLE elevated on pillow) Nurse Communication: Mobility status PT Visit Diagnosis: Unsteadiness on feet (R26.81);Muscle weakness (generalized) (M62.81);Pain Pain - Right/Left: Left Pain - part of body: Ankle and joints of foot    Time: 1791-5056 PT Time Calculation (min) (ACUTE ONLY): 45 min   Charges:   PT Evaluation $PT Eval Low Complexity: 1 Low PT Treatments $Gait Training: 8-22 mins        Herminio Commons, PT, DPT 12:41 PM,12/13/20

## 2020-12-13 NOTE — Progress Notes (Addendum)
PROGRESS NOTE    Beth Carroll  AOZ:[id number] DOB: 08/05/1970 DOA: 12/09/2020 PCP: Baxter Hire, MD    Chief Complaint  Patient presents with   Edema    Brief Narrative:    Beth Carroll is a 50 y.o. female with medical history significant for DM, HTN, bilateral lower extremity edema for the past 3 months with negative venous doppler on 10/26, and with little response to empiric Lasix, compression elevated who presents to the emergency room with a several day history of nausea, poor appetite and dizziness on standing.  She continues to take her Lasix as prescribed.  She also reports bilateral lower extremity pain with ambulation that improves with rest as well as rest pain.  She denies fever or chills.  Denies chest pain no shortness of breath or cough.  Denies abdominal pain.     ABIs done in the ED 0.3, correlating with decreased capillary refill.  The ED provider contacted vascular surgeon Dr. Lucky Cowboy who was agreeable with heparin anticoagulation in preparation for angiogram in the a.m. Potassium repleted orally in the ED    Assessment & Plan:   Principal Problem:   Postural dizziness with presyncope Active Problems:   Hypertension   Rest pain of both lower extremities due to atherosclerosis (HCC)   Hyperglycemia due to type 2 diabetes mellitus (HCC)   Hypokalemia   Bilateral lower extremity edema   Acute systolic heart failure (HCC)   Severe peripheral arterial disease with rest pain -Her ABI in the critical range, she is with severe dependent edema. -Vascular surgery consult greatly appreciated, patient S/P   1.  Ultrasound guidance for vascular access right femoral artery             2.  Catheter placement into left common femoral artery from right femoral approach             3.  Aortogram and selective left lower extremity angiogram             4.  Percutaneous transluminal angioplasty of left anterior tibial artery with 2.5 mm angioplasty balloon             5.   Percutaneous transluminal angioplasty of left posterior tibial artery with 2.5 mm diameter angioplasty balloon             6.  Mechanical thrombectomy to the left SFA and popliteal arteries with the Rota Rex device to debulk chronic thrombus             7.  Angioplasty of the left SFA and popliteal arteries with 4 mm diameter by 30 cm length and 4 mm diameter by 15 cm length Lutonix drug-coated angioplasty balloons             8.  Viabahn stent placement x2 to the left SFA and above-knee popliteal arteries with a 6 mm diameter by 25 cm length stent and 6 mm diameter by 7.5 cm length stent             9.  StarClose closure device right femoral artery  -On heparin GTT, currently transitioned to aspirin and Plavix as discussed with vascular surgery -Plan for right lower extremity intervention next week, more likely on Wednesday as she will need to have her coronary intervention first.  Acute systolic CHF, -Cardiology input greatly appreciated, EKG concerning for possible prior anterior MI. -Likely will need diagnostic cath, likely will happen on Monday as discussed with cardiology, and then right lower extremity intervention on Wednesday.   -  Started on low-dose Coreg.       Postural dizziness with presyncope - Continue to monitor on telemetry.  Type 2 diabetes mellitus, poorly controlled with hyperglycemia . -A1c is elevated at 13.7  -Continue with Semglee 22 units, will add 3 units of NovoLog 3 times daily as well.,  Continue with insulin sliding scale.    Severe hypokalemia -Repleted, continue to monitor closely  Tobacco abuse -She was counseled.     Hypertension - She is not on any medications at home, now blood pressure started to increase she is on Coreg for her CHF.     DVT prophylaxis: Subcu Lovenox Code Status: Full Family Communication: Discussed with husband at bedside yesterday Disposition:   Status is: Inpatient  Remains inpatient appropriate because: Vascular  intervention, on heparin GTT       Consultants:  Vascular surgery  Cardiology  Subjective:  Left lower extremity pain is controlled, no chest pain, no shortness of breath. Objective: Vitals:   12/13/20 0531 12/13/20 0727 12/13/20 0858 12/13/20 1200  BP: (!) 158/82 (!) 160/97 (!) 147/96 125/78  Pulse: 76 79 88 79  Resp: 16 18  18   Temp: 98.4 F (36.9 C) 97.8 F (36.6 C)  97.7 F (36.5 C)  TempSrc:      SpO2: 100% 100%  96%  Weight:      Height:        Intake/Output Summary (Last 24 hours) at 12/13/2020 1449 Last data filed at 12/13/2020 0727 Gross per 24 hour  Intake --  Output 300 ml  Net -300 ml   Filed Weights   12/10/20 0244 12/11/20 0406 12/13/20 0503  Weight: 49.7 kg 49.7 kg 48.6 kg    Examination:  Awake Alert, Oriented X 3, No new F.N deficits, Normal affect Symmetrical Chest wall movement, Good air movement bilaterally, CTAB RRR,No Gallops,Rubs or new Murmurs, No Parasternal Heave +ve B.Sounds, Abd Soft, No tenderness, No rebound - guarding or rigidity. No Cyanosis, Clubbing or edema, No new Rash or bruise, she has decreased lower extremity pulses     Data Reviewed: I have personally reviewed following labs and imaging studies  CBC: Recent Labs  Lab 12/09/20 1501 12/10/20 0318 12/11/20 0138 12/12/20 0532 12/13/20 0352  WBC 4.7 4.5 4.5 4.8 4.7  HGB 13.5 12.5 10.9* 11.1* 9.9*  HCT 39.2 35.7* 31.5* 33.6* 30.5*  MCV 84.1 84.8 85.8 88.9 88.7  PLT 230 183 145* 131* 125*    Basic Metabolic Panel: Recent Labs  Lab 12/10/20 0458 12/10/20 0509 12/10/20 1840 12/11/20 0138 12/12/20 0532 12/13/20 0352  NA 137  --  137 133* 138 134*  K 2.6*  --  3.1* 3.1* 4.0 3.7  CL 100  --  102 101 103 99  CO2 28  --  30 27 29 30   GLUCOSE 343*  --  320* 477* 183* 238*  BUN 20  --  19 17 12 10   CREATININE 0.65  --  0.64 0.60 0.31* 0.40*  CALCIUM 8.8*  --  8.5* 8.2* 8.6* 8.5*  MG  --  2.0  --   --   --   --     GFR: Estimated Creatinine Clearance:  63.8 mL/min (A) (by C-G formula based on SCr of 0.4 mg/dL (L)).  Liver Function Tests: Recent Labs  Lab 12/09/20 1501  AST 15  ALT 12  ALKPHOS 59  BILITOT 2.2*  PROT 7.4  ALBUMIN 3.9    CBG: Recent Labs  Lab 12/12/20 1151 12/12/20 1635 12/12/20 2127 12/13/20  5573 12/13/20 1141  GLUCAP 184* 176* 168* 219* 161*     Recent Results (from the past 240 hour(s))  Resp Panel by RT-PCR (Flu A&B, Covid) Nasopharyngeal Swab     Status: None   Collection Time: 12/10/20  8:20 AM   Specimen: Nasopharyngeal Swab; Nasopharyngeal(NP) swabs in vial transport medium  Result Value Ref Range Status   SARS Coronavirus 2 by RT PCR NEGATIVE NEGATIVE Final    Comment: (NOTE) SARS-CoV-2 target nucleic acids are NOT DETECTED.  The SARS-CoV-2 RNA is generally detectable in upper respiratory specimens during the acute phase of infection. The lowest concentration of SARS-CoV-2 viral copies this assay can detect is 138 copies/mL. A negative result does not preclude SARS-Cov-2 infection and should not be used as the sole basis for treatment or other patient management decisions. A negative result may occur with  improper specimen collection/handling, submission of specimen other than nasopharyngeal swab, presence of viral mutation(s) within the areas targeted by this assay, and inadequate number of viral copies(<138 copies/mL). A negative result must be combined with clinical observations, patient history, and epidemiological information. The expected result is Negative.  Fact Sheet for Patients:  EntrepreneurPulse.com.au  Fact Sheet for Healthcare Providers:  IncredibleEmployment.be  This test is no t yet approved or cleared by the [place] FDA and  has been authorized for detection and/or diagnosis of SARS-CoV-2 by FDA under an Emergency Use Authorization (EUA). This EUA will remain  in effect (meaning this test can be used) for the duration of  the COVID-19 declaration under Section 564(b)(1) of the Act, 21 U.S.C.section 360bbb-3(b)(1), unless the authorization is terminated  or revoked sooner.       Influenza A by PCR NEGATIVE NEGATIVE Final   Influenza B by PCR NEGATIVE NEGATIVE Final    Comment: (NOTE) The Xpert Xpress SARS-CoV-2/FLU/RSV plus assay is intended as an aid in the diagnosis of influenza from Nasopharyngeal swab specimens and should not be used as a sole basis for treatment. Nasal washings and aspirates are unacceptable for Xpert Xpress SARS-CoV-2/FLU/RSV testing.  Fact Sheet for Patients: EntrepreneurPulse.com.au  Fact Sheet for Healthcare Providers: IncredibleEmployment.be  This test is not yet approved or cleared by the [place] FDA and has been authorized for detection and/or diagnosis of SARS-CoV-2 by FDA under an Emergency Use Authorization (EUA). This EUA will remain in effect (meaning this test can be used) for the duration of the COVID-19 declaration under Section 564(b)(1) of the Act, 21 U.S.C. section 360bbb-3(b)(1), unless the authorization is terminated or revoked.  Performed at [facility], [address], [place], [place] [address]          Radiology Studies: No results found.      Scheduled Meds:  aspirin EC  81 mg Oral Daily   atorvastatin  20 mg Oral Daily   carvedilol  3.125 mg Oral BID WC   clopidogrel  75 mg Oral Daily   enoxaparin (LOVENOX) injection  40 mg Subcutaneous Q24H   insulin aspart  0-15 Units Subcutaneous TID WC   insulin aspart  0-5 Units Subcutaneous QHS   insulin aspart  3 Units Subcutaneous TID WC   insulin glargine-yfgn  25 Units Subcutaneous Daily   Continuous Infusions:     LOS: 3 days        Phillips Climes, MD [facility]   To contact the attending provider between 7A-7P or the covering provider during after hours 7P-7A, please log into the web site [email or web address] and  access using universal [facility] password for that web site. If you do not have the password, please call the hospital operator.  12/13/2020, 2:49 PM

## 2020-12-14 ENCOUNTER — Other Ambulatory Visit: Payer: Self-pay | Admitting: Cardiovascular Disease

## 2020-12-14 DIAGNOSIS — I998 Other disorder of circulatory system: Secondary | ICD-10-CM | POA: Diagnosis not present

## 2020-12-14 DIAGNOSIS — E1165 Type 2 diabetes mellitus with hyperglycemia: Secondary | ICD-10-CM | POA: Diagnosis not present

## 2020-12-14 DIAGNOSIS — R600 Localized edema: Secondary | ICD-10-CM | POA: Diagnosis not present

## 2020-12-14 DIAGNOSIS — I10 Essential (primary) hypertension: Secondary | ICD-10-CM | POA: Diagnosis not present

## 2020-12-14 DIAGNOSIS — I5021 Acute systolic (congestive) heart failure: Secondary | ICD-10-CM

## 2020-12-14 LAB — GLUCOSE, CAPILLARY
Glucose-Capillary: 222 mg/dL — ABNORMAL HIGH (ref 70–99)
Glucose-Capillary: 186 mg/dL — ABNORMAL HIGH (ref 70–99)
Glucose-Capillary: 152 mg/dL — ABNORMAL HIGH (ref 70–99)
Glucose-Capillary: 251 mg/dL — ABNORMAL HIGH (ref 70–99)

## 2020-12-14 LAB — CBC
HCT: 30.6 % — ABNORMAL LOW (ref 36.0–46.0)
Hemoglobin: 10.1 g/dL — ABNORMAL LOW (ref 12.0–15.0)
MCH: 29.6 pg (ref 26.0–34.0)
MCHC: 33 g/dL (ref 30.0–36.0)
MCV: 89.7 fL (ref 80.0–100.0)
Platelets: 137 10*3/uL — ABNORMAL LOW (ref 150–400)
RBC: 3.41 MIL/uL — ABNORMAL LOW (ref 3.87–5.11)
RDW: 12.9 % (ref 11.5–15.5)
WBC: 3.6 10*3/uL — ABNORMAL LOW (ref 4.0–10.5)
nRBC: 0 % (ref 0.0–0.2)

## 2020-12-14 LAB — BASIC METABOLIC PANEL
Anion gap: 4 — ABNORMAL LOW (ref 5–15)
BUN: 7 mg/dL (ref 6–20)
CO2: 30 mmol/L (ref 22–32)
Calcium: 8.5 mg/dL — ABNORMAL LOW (ref 8.9–10.3)
Chloride: 101 mmol/L (ref 98–111)
Creatinine, Ser: <0.3 mg/dL — ABNORMAL LOW (ref 0.44–1.00)
Glucose, Bld: 217 mg/dL — ABNORMAL HIGH (ref 70–99)
Potassium: 4.2 mmol/L (ref 3.5–5.1)
Sodium: 135 mmol/L (ref 135–145)

## 2020-12-14 MED ORDER — SODIUM CHLORIDE 0.9 % IV SOLN: 250.0000 mL | INTRAVENOUS | Status: DC | PRN

## 2020-12-14 MED ORDER — PANTOPRAZOLE SODIUM 40 MG PO TBEC
40.0000 mg | DELAYED_RELEASE_TABLET | Freq: Every day | ORAL | Status: DC
  Administered 2020-12-14 – 2020-12-16 (×3): 40 mg via ORAL
  Filled 2020-12-14 (×3): qty 1

## 2020-12-14 MED ORDER — SODIUM CHLORIDE 0.9 % IV SOLN
INTRAVENOUS | Status: DC
  Administered 2020-12-15: 1000 mL via INTRAVENOUS

## 2020-12-14 MED ORDER — SODIUM CHLORIDE 0.9% FLUSH
3.0000 mL | Freq: Two times a day (BID) | INTRAVENOUS | Status: DC
  Administered 2020-12-15 (×2): 3 mL via INTRAVENOUS

## 2020-12-14 MED ORDER — SODIUM CHLORIDE 0.9% FLUSH: 3.0000 mL | INTRAVENOUS | Status: DC | PRN

## 2020-12-14 NOTE — H&P (View-Only) (Signed)
Progress Note  Patient Name: Beth Carroll Date of Encounter: 12/14/2020  [facility] Cardiologist: [place]  Subjective   Reports continued improvement in her leg pain, swelling, feeling Feet still tingle foot tingles on the left, can feel sensation now Able to ambulate short distances with a walker  Inpatient Medications    Scheduled Meds:  aspirin EC  81 mg Oral Daily   atorvastatin  20 mg Oral Daily   carvedilol  3.125 mg Oral BID WC   clopidogrel  75 mg Oral Daily   enoxaparin (LOVENOX) injection  40 mg Subcutaneous Q24H   insulin aspart  0-15 Units Subcutaneous TID WC   insulin aspart  0-5 Units Subcutaneous QHS   insulin aspart  3 Units Subcutaneous TID WC   insulin glargine-yfgn  25 Units Subcutaneous Daily   pantoprazole  40 mg Oral Daily   Continuous Infusions:  PRN Meds: acetaminophen **OR** acetaminophen, hydrALAZINE, HYDROcodone-acetaminophen, morphine injection, ondansetron **OR** ondansetron (ZOFRAN) IV   Vital Signs    Vitals:   12/13/20 2018 12/14/20 0541 12/14/20 0730 12/14/20 1156  BP: 120/68 (!) 145/99 (!) 155/91 (!) 91/52  Pulse: 78 76 78 76  Resp: 17 18 20 17   Temp: 98.6 F (37 C) 98.2 F (36.8 C) 98.7 F (37.1 C) 98.7 F (37.1 C)  TempSrc:  Oral Oral   SpO2: 100% 97% 98% 100%  Weight:  48.5 kg    Height:        Intake/Output Summary (Last 24 hours) at 12/14/2020 1508 Last data filed at 12/14/2020 1100 Gross per 24 hour  Intake 360 ml  Output 600 ml  Net -240 ml   Last 3 Weights 12/14/2020 12/13/2020 12/11/2020  Weight (lbs) 106 lb 14.8 oz 107 lb 2.3 oz 109 lb 9.1 oz  Weight (kg) 48.5 kg 48.6 kg 49.7 kg      Telemetry    Normal sinus rhythm- Personally Reviewed  ECG     - Personally Reviewed  Physical Exam   GEN: No acute distress.   Neck: No JVD Cardiac: RRR, no murmurs, rubs, or gallops.  Decreased pulses lower extremities Respiratory: Clear to auscultation bilaterally. GI: Soft, nontender, non-distended   MS: No edema; No deformity. Neuro:  Nonfocal  Psych: Normal affect   Labs    High Sensitivity Troponin:   Recent Labs  Lab 12/09/20 1501 12/09/20 1815  TROPONINIHS 28* 27*     Chemistry Recent Labs  Lab 12/09/20 1501 12/09/20 2056 12/10/20 0509 12/10/20 1840 12/12/20 0532 12/13/20 0352 12/14/20 0558  NA 132*   < >  --    < > 138 134* 135  K 2.4*   < >  --    < > 4.0 3.7 4.2  CL 87*   < >  --    < > 103 99 101  CO2 23   < >  --    < > 29 30 30   GLUCOSE 392*   < >  --    < > 183* 238* 217*  BUN 25*   < >  --    < > 12 10 7   CREATININE 0.92   < >  --    < > 0.31* 0.40* <0.30*  CALCIUM 9.2   < >  --    < > 8.6* 8.5* 8.5*  MG  --   --  2.0  --   --   --   --   PROT 7.4  --   --   --   --   --   --  ALBUMIN 3.9  --   --   --   --   --   --   AST 15  --   --   --   --   --   --   ALT 12  --   --   --   --   --   --   ALKPHOS 59  --   --   --   --   --   --   BILITOT 2.2*  --   --   --   --   --   --   GFRNONAA >60   < >  --    < > >60 >60 NOT CALCULATED  ANIONGAP 22*   < >  --    < > 6 5 4*   < > = values in this interval not displayed.    Lipids  Recent Labs  Lab 12/12/20 0532  CHOL 120  TRIG 59  HDL 45  LDLCALC 63  CHOLHDL 2.7    Hematology Recent Labs  Lab 12/12/20 0532 12/13/20 0352 12/14/20 0558  WBC 4.8 4.7 3.6*  RBC 3.78* 3.44* 3.41*  HGB 11.1* 9.9* 10.1*  HCT 33.6* 30.5* 30.6*  MCV 88.9 88.7 89.7  MCH 29.4 28.8 29.6  MCHC 33.0 32.5 33.0  RDW 12.8 12.9 12.9  PLT 131* 125* 137*   Thyroid No results for input(s): TSH, FREET4 in the last 168 hours.  BNP Recent Labs  Lab 12/09/20 1815  BNP 106.3*    DDimer No results for input(s): DDIMER in the last 168 hours.   Radiology    No results found.  Cardiac Studies    Echo  1. Left ventricular ejection fraction, by estimation, is 40 to 45%. The  left ventricle has mildly decreased function.   Patient Profile     50 y.o. female with history of DM2, HTN, and lower extremity swelling and  ongoing tobacco use who is being seen today for the evaluation of cardiomyopathy  Assessment & Plan    A/P: PAD/critical limb ischemia long smoking history, poorly controlled diabetes A1c of 13  intervention left lower extremity December 10 2020 we will continue -Per the notes plan is for cardiac catheterization November 21 with repeat lower extremity intervention November 23 right leg -Currently on aspirin, Plavix, statin/Lipitor 20 -Lower blood pressure after pain meds   Cardiomyopathy, likely ischemic Stress cardiomyopathy versus ischemic cardiomyopathy concern for old anterior MI on EKG -Cardiac catheterization scheduled November 21, timing unclear given busy schedule On aspirin Plavix statin beta-blocker I have reviewed the risks, indications, and alternatives to cardiac catheterization, possible angioplasty, and stenting with the patient. Risks include but are not limited to bleeding, infection, vascular injury, stroke, myocardial infection, arrhythmia, kidney injury, radiation-related injury in the case of prolonged fluoroscopy use, emergency cardiac surgery, and death. The patient understands the risks of serious complication is 1-2 in 7425 with diagnostic cardiac cath and 1-2% or less with angioplasty/stenting.  -She is willing to proceed.  Orders placed   Smoker Smoking cessation recommended, she does not want Chantix or nicotine patch Again discussed with her in detail Discussed alternatives   Diabetes type 2 with complications, poorly controlled A1c 13.7 Insulin sliding scale, will need aggressive intervention Needs outpatient follow-up with endocrinology    Total encounter time more than 35 minutes  Greater than 50% was spent in counseling and coordination of care with the patient   For questions or updates, please contact [place] Please  consult [email or web address] for contact info under        Signed, Ida Rogue, MD  12/14/2020, 3:08 PM

## 2020-12-14 NOTE — Plan of Care (Signed)

## 2020-12-14 NOTE — Progress Notes (Signed)
PROGRESS NOTE    Beth Carroll  IWP:[id number] DOB: Jan 26, 1970 DOA: 12/09/2020 PCP: Baxter Hire, MD    Chief Complaint  Patient presents with   Edema    Brief Narrative:    Beth Carroll is a 50 y.o. female with medical history significant for DM, HTN, bilateral lower extremity edema for the past 3 months with negative venous doppler on 10/26, and with little response to empiric Lasix, compression elevated who presents to the emergency room with a several day history of nausea, poor appetite and dizziness on standing.  Reports lower extremity edema despite being on Lasix,   ABIs done in the ED 0.3, correlating with decreased capillary refill.  She was seen by vascular surgery, work-up significant for severe peripheral arterial disease, she was admitted for further work-up.     Assessment & Plan:   Principal Problem:   Postural dizziness with presyncope Active Problems:   Hypertension   Rest pain of both lower extremities due to atherosclerosis (HCC)   Hyperglycemia due to type 2 diabetes mellitus (HCC)   Hypokalemia   Bilateral lower extremity edema   Acute systolic heart failure (HCC)   Severe peripheral arterial disease with rest pain -Her ABI in the critical range, she is with severe dependent edema. -Vascular surgery consult greatly appreciated, patient S/P   1.  Ultrasound guidance for vascular access right femoral artery             2.  Catheter placement into left common femoral artery from right femoral approach             3.  Aortogram and selective left lower extremity angiogram             4.  Percutaneous transluminal angioplasty of left anterior tibial artery with 2.5 mm angioplasty balloon             5.  Percutaneous transluminal angioplasty of left posterior tibial artery with 2.5 mm diameter angioplasty balloon             6.  Mechanical thrombectomy to the left SFA and popliteal arteries with the Rota Rex device to debulk chronic thrombus              7.  Angioplasty of the left SFA and popliteal arteries with 4 mm diameter by 30 cm length and 4 mm diameter by 15 cm length Lutonix drug-coated angioplasty balloons             8.  Viabahn stent placement x2 to the left SFA and above-knee popliteal arteries with a 6 mm diameter by 25 cm length stent and 6 mm diameter by 7.5 cm length stent             9.  StarClose closure device right femoral artery  -On heparin GTT, currently transitioned to aspirin and Plavix as discussed with vascular surgery -Plan for right lower extremity intervention next week, more likely on Wednesday as she will need to have her coronary intervention first.  Acute systolic CHF, -Cardiology input greatly appreciated, EKG concerning for possible prior anterior MI. -Plan for cardiac cath tomorrow by cardiology prior to right lower extremity intervention.  -Started on low-dose Coreg.       Postural dizziness with presyncope - Continue to monitor on telemetry.  Type 2 diabetes mellitus, poorly controlled with hyperglycemia . -A1c is elevated at 13.7  -Continue with Semglee 22 units, will add 3 units of NovoLog 3 times daily as well.,  Continue with insulin  sliding scale.    Severe hypokalemia -Repleted, continue to monitor closely  Tobacco abuse -She was counseled.     Hypertension - She is not on any medications at home, now blood pressure started to increase she is on Coreg for her CHF.     DVT prophylaxis: Subcu Lovenox Code Status: Full Family Communication: none at bedside Disposition:   Status is: Inpatient  Remains inpatient appropriate because: Vascular intervention, on heparin GTT       Consultants:  Vascular surgery  Cardiology  Subjective:  Left lower extremity pain is controlled, no chest pain, no shortness of breath. Objective: Vitals:   12/13/20 1504 12/13/20 2018 12/14/20 0541 12/14/20 0730  BP: 109/65 120/68 (!) 145/99 (!) 155/91  Pulse: 77 78 76 78  Resp: 18 17 18 20   Temp:  97.8 F (36.6 C) 98.6 F (37 C) 98.2 F (36.8 C) 98.7 F (37.1 C)  TempSrc: Oral  Oral Oral  SpO2:  100% 97% 98%  Weight:   48.5 kg   Height:        Intake/Output Summary (Last 24 hours) at 12/14/2020 1124 Last data filed at 12/14/2020 1000 Gross per 24 hour  Intake 120 ml  Output 400 ml  Net -280 ml   Filed Weights   12/11/20 0406 12/13/20 0503 12/14/20 0541  Weight: 49.7 kg 48.6 kg 48.5 kg    Examination:  Awake Alert, Oriented X 3, No new F.N deficits, Normal affect Symmetrical Chest wall movement, Good air movement bilaterally, CTAB RRR,No Gallops,Rubs or new Murmurs, No Parasternal Heave +ve B.Sounds, Abd Soft, No tenderness, No rebound - guarding or rigidity. Has diminished pulses bilaterally, left foot with improving edema and erythema,      Data Reviewed: I have personally reviewed following labs and imaging studies  CBC: Recent Labs  Lab 12/10/20 0318 12/11/20 0138 12/12/20 0532 12/13/20 0352 12/14/20 0558  WBC 4.5 4.5 4.8 4.7 3.6*  HGB 12.5 10.9* 11.1* 9.9* 10.1*  HCT 35.7* 31.5* 33.6* 30.5* 30.6*  MCV 84.8 85.8 88.9 88.7 89.7  PLT 183 145* 131* 125* 137*    Basic Metabolic Panel: Recent Labs  Lab 12/10/20 0509 12/10/20 1840 12/11/20 0138 12/12/20 0532 12/13/20 0352 12/14/20 0558  NA  --  137 133* 138 134* 135  K  --  3.1* 3.1* 4.0 3.7 4.2  CL  --  102 101 103 99 101  CO2  --  30 27 29 30 30   GLUCOSE  --  320* 477* 183* 238* 217*  BUN  --  19 17 12 10 7   CREATININE  --  0.64 0.60 0.31* 0.40* <0.30*  CALCIUM  --  8.5* 8.2* 8.6* 8.5* 8.5*  MG 2.0  --   --   --   --   --     GFR: CrCl cannot be calculated (This lab value cannot be used to calculate CrCl because it is not a number: <0.30).  Liver Function Tests: Recent Labs  Lab 12/09/20 1501  AST 15  ALT 12  ALKPHOS 59  BILITOT 2.2*  PROT 7.4  ALBUMIN 3.9    CBG: Recent Labs  Lab 12/13/20 0726 12/13/20 1141 12/13/20 1610 12/13/20 2038 12/14/20 0732  GLUCAP 219* 161*  155* 119* 222*     Recent Results (from the past 240 hour(s))  Resp Panel by RT-PCR (Flu A&B, Covid) Nasopharyngeal Swab     Status: None   Collection Time: 12/10/20  8:20 AM   Specimen: Nasopharyngeal Swab; Nasopharyngeal(NP) swabs in vial transport  medium  Result Value Ref Range Status   SARS Coronavirus 2 by RT PCR NEGATIVE NEGATIVE Final    Comment: (NOTE) SARS-CoV-2 target nucleic acids are NOT DETECTED.  The SARS-CoV-2 RNA is generally detectable in upper respiratory specimens during the acute phase of infection. The lowest concentration of SARS-CoV-2 viral copies this assay can detect is 138 copies/mL. A negative result does not preclude SARS-Cov-2 infection and should not be used as the sole basis for treatment or other patient management decisions. A negative result may occur with  improper specimen collection/handling, submission of specimen other than nasopharyngeal swab, presence of viral mutation(s) within the areas targeted by this assay, and inadequate number of viral copies(<138 copies/mL). A negative result must be combined with clinical observations, patient history, and epidemiological information. The expected result is Negative.  Fact Sheet for Patients:  EntrepreneurPulse.com.au  Fact Sheet for Healthcare Providers:  IncredibleEmployment.be  This test is no t yet approved or cleared by the [place] FDA and  has been authorized for detection and/or diagnosis of SARS-CoV-2 by FDA under an Emergency Use Authorization (EUA). This EUA will remain  in effect (meaning this test can be used) for the duration of the COVID-19 declaration under Section 564(b)(1) of the Act, 21 U.S.C.section 360bbb-3(b)(1), unless the authorization is terminated  or revoked sooner.       Influenza A by PCR NEGATIVE NEGATIVE Final   Influenza B by PCR NEGATIVE NEGATIVE Final    Comment: (NOTE) The Xpert Xpress SARS-CoV-2/FLU/RSV plus assay  is intended as an aid in the diagnosis of influenza from Nasopharyngeal swab specimens and should not be used as a sole basis for treatment. Nasal washings and aspirates are unacceptable for Xpert Xpress SARS-CoV-2/FLU/RSV testing.  Fact Sheet for Patients: EntrepreneurPulse.com.au  Fact Sheet for Healthcare Providers: IncredibleEmployment.be  This test is not yet approved or cleared by the [place] FDA and has been authorized for detection and/or diagnosis of SARS-CoV-2 by FDA under an Emergency Use Authorization (EUA). This EUA will remain in effect (meaning this test can be used) for the duration of the COVID-19 declaration under Section 564(b)(1) of the Act, 21 U.S.C. section 360bbb-3(b)(1), unless the authorization is terminated or revoked.  Performed at [facility], [address] [place], [place] [address]          Radiology Studies: No results found.      Scheduled Meds:  aspirin EC  81 mg Oral Daily   atorvastatin  20 mg Oral Daily   carvedilol  3.125 mg Oral BID WC   clopidogrel  75 mg Oral Daily   enoxaparin (LOVENOX) injection  40 mg Subcutaneous Q24H   insulin aspart  0-15 Units Subcutaneous TID WC   insulin aspart  0-5 Units Subcutaneous QHS   insulin aspart  3 Units Subcutaneous TID WC   insulin glargine-yfgn  25 Units Subcutaneous Daily   pantoprazole  40 mg Oral Daily   Continuous Infusions:     LOS: 4 days        Phillips Climes, MD [facility]   To contact the attending provider between 7A-7P or the covering provider during after hours 7P-7A, please log into the web site [email or web address] and access using universal [place] password for that web site. If you do not have the password, please call the hospital operator.  12/14/2020, 11:24 AM

## 2020-12-14 NOTE — Progress Notes (Signed)
Progress Note  Patient Name: Beth Carroll Date of Encounter: 12/14/2020  [facility] Cardiologist: [place]  Subjective   Reports continued improvement in her leg pain, swelling, feeling Feet still tingle foot tingles on the left, can feel sensation now Able to ambulate short distances with a walker  Inpatient Medications    Scheduled Meds:  aspirin EC  81 mg Oral Daily   atorvastatin  20 mg Oral Daily   carvedilol  3.125 mg Oral BID WC   clopidogrel  75 mg Oral Daily   enoxaparin (LOVENOX) injection  40 mg Subcutaneous Q24H   insulin aspart  0-15 Units Subcutaneous TID WC   insulin aspart  0-5 Units Subcutaneous QHS   insulin aspart  3 Units Subcutaneous TID WC   insulin glargine-yfgn  25 Units Subcutaneous Daily   pantoprazole  40 mg Oral Daily   Continuous Infusions:  PRN Meds: acetaminophen **OR** acetaminophen, hydrALAZINE, HYDROcodone-acetaminophen, morphine injection, ondansetron **OR** ondansetron (ZOFRAN) IV   Vital Signs    Vitals:   12/13/20 2018 12/14/20 0541 12/14/20 0730 12/14/20 1156  BP: 120/68 (!) 145/99 (!) 155/91 (!) 91/52  Pulse: 78 76 78 76  Resp: 17 18 20 17   Temp: 98.6 F (37 C) 98.2 F (36.8 C) 98.7 F (37.1 C) 98.7 F (37.1 C)  TempSrc:  Oral Oral   SpO2: 100% 97% 98% 100%  Weight:  48.5 kg    Height:        Intake/Output Summary (Last 24 hours) at 12/14/2020 1508 Last data filed at 12/14/2020 1100 Gross per 24 hour  Intake 360 ml  Output 600 ml  Net -240 ml   Last 3 Weights 12/14/2020 12/13/2020 12/11/2020  Weight (lbs) 106 lb 14.8 oz 107 lb 2.3 oz 109 lb 9.1 oz  Weight (kg) 48.5 kg 48.6 kg 49.7 kg      Telemetry    Normal sinus rhythm- Personally Reviewed  ECG     - Personally Reviewed  Physical Exam   GEN: No acute distress.   Neck: No JVD Cardiac: RRR, no murmurs, rubs, or gallops.  Decreased pulses lower extremities Respiratory: Clear to auscultation bilaterally. GI: Soft, nontender, non-distended   MS: No edema; No deformity. Neuro:  Nonfocal  Psych: Normal affect   Labs    High Sensitivity Troponin:   Recent Labs  Lab 12/09/20 1501 12/09/20 1815  TROPONINIHS 28* 27*     Chemistry Recent Labs  Lab 12/09/20 1501 12/09/20 2056 12/10/20 0509 12/10/20 1840 12/12/20 0532 12/13/20 0352 12/14/20 0558  NA 132*   < >  --    < > 138 134* 135  K 2.4*   < >  --    < > 4.0 3.7 4.2  CL 87*   < >  --    < > 103 99 101  CO2 23   < >  --    < > 29 30 30   GLUCOSE 392*   < >  --    < > 183* 238* 217*  BUN 25*   < >  --    < > 12 10 7   CREATININE 0.92   < >  --    < > 0.31* 0.40* <0.30*  CALCIUM 9.2   < >  --    < > 8.6* 8.5* 8.5*  MG  --   --  2.0  --   --   --   --   PROT 7.4  --   --   --   --   --   --  ALBUMIN 3.9  --   --   --   --   --   --   AST 15  --   --   --   --   --   --   ALT 12  --   --   --   --   --   --   ALKPHOS 59  --   --   --   --   --   --   BILITOT 2.2*  --   --   --   --   --   --   GFRNONAA >60   < >  --    < > >60 >60 NOT CALCULATED  ANIONGAP 22*   < >  --    < > 6 5 4*   < > = values in this interval not displayed.    Lipids  Recent Labs  Lab 12/12/20 0532  CHOL 120  TRIG 59  HDL 45  LDLCALC 63  CHOLHDL 2.7    Hematology Recent Labs  Lab 12/12/20 0532 12/13/20 0352 12/14/20 0558  WBC 4.8 4.7 3.6*  RBC 3.78* 3.44* 3.41*  HGB 11.1* 9.9* 10.1*  HCT 33.6* 30.5* 30.6*  MCV 88.9 88.7 89.7  MCH 29.4 28.8 29.6  MCHC 33.0 32.5 33.0  RDW 12.8 12.9 12.9  PLT 131* 125* 137*   Thyroid No results for input(s): TSH, FREET4 in the last 168 hours.  BNP Recent Labs  Lab 12/09/20 1815  BNP 106.3*    DDimer No results for input(s): DDIMER in the last 168 hours.   Radiology    No results found.  Cardiac Studies    Echo  1. Left ventricular ejection fraction, by estimation, is 40 to 45%. The  left ventricle has mildly decreased function.   Patient Profile     50 y.o. female with history of DM2, HTN, and lower extremity swelling and  ongoing tobacco use who is being seen today for the evaluation of cardiomyopathy  Assessment & Plan    A/P: PAD/critical limb ischemia long smoking history, poorly controlled diabetes A1c of 13  intervention left lower extremity December 10 2020 we will continue -Per the notes plan is for cardiac catheterization November 21 with repeat lower extremity intervention November 23 right leg -Currently on aspirin, Plavix, statin/Lipitor 20 -Lower blood pressure after pain meds   Cardiomyopathy, likely ischemic Stress cardiomyopathy versus ischemic cardiomyopathy concern for old anterior MI on EKG -Cardiac catheterization scheduled November 21, timing unclear given busy schedule On aspirin Plavix statin beta-blocker I have reviewed the risks, indications, and alternatives to cardiac catheterization, possible angioplasty, and stenting with the patient. Risks include but are not limited to bleeding, infection, vascular injury, stroke, myocardial infection, arrhythmia, kidney injury, radiation-related injury in the case of prolonged fluoroscopy use, emergency cardiac surgery, and death. The patient understands the risks of serious complication is 1-2 in 1829 with diagnostic cardiac cath and 1-2% or less with angioplasty/stenting.  -She is willing to proceed.  Orders placed   Smoker Smoking cessation recommended, she does not want Chantix or nicotine patch Again discussed with her in detail Discussed alternatives   Diabetes type 2 with complications, poorly controlled A1c 13.7 Insulin sliding scale, will need aggressive intervention Needs outpatient follow-up with endocrinology    Total encounter time more than 35 minutes  Greater than 50% was spent in counseling and coordination of care with the patient   For questions or updates, please contact [place] Please  consult [email or web address] for contact info under        Signed, Ida Rogue, MD  12/14/2020, 3:08 PM

## 2020-12-15 ENCOUNTER — Encounter
Admission: EM | Disposition: A | Discharge status: Home / Self Care | Payer: Self-pay | Source: Home / Self Care | Attending: Internal Medicine

## 2020-12-15 DIAGNOSIS — I5021 Acute systolic (congestive) heart failure: Secondary | ICD-10-CM | POA: Diagnosis not present

## 2020-12-15 DIAGNOSIS — I2510 Atherosclerotic heart disease of native coronary artery without angina pectoris: Secondary | ICD-10-CM

## 2020-12-15 DIAGNOSIS — I998 Other disorder of circulatory system: Secondary | ICD-10-CM | POA: Diagnosis not present

## 2020-12-15 HISTORY — PX: LEFT HEART CATH AND CORONARY ANGIOGRAPHY: CATH118249

## 2020-12-15 HISTORY — DX: Atherosclerotic heart disease of native coronary artery without angina pectoris: I25.10

## 2020-12-15 HISTORY — PX: CORONARY STENT INTERVENTION: CATH118234

## 2020-12-15 LAB — CBC
HCT: 30.6 % — ABNORMAL LOW (ref 36.0–46.0)
Hemoglobin: 9.9 g/dL — ABNORMAL LOW (ref 12.0–15.0)
MCH: 29.1 pg (ref 26.0–34.0)
MCHC: 32.4 g/dL (ref 30.0–36.0)
MCV: 90 fL (ref 80.0–100.0)
Platelets: 155 10*3/uL (ref 150–400)
RBC: 3.4 MIL/uL — ABNORMAL LOW (ref 3.87–5.11)
RDW: 12.9 % (ref 11.5–15.5)
WBC: 3.8 10*3/uL — ABNORMAL LOW (ref 4.0–10.5)
nRBC: 0 % (ref 0.0–0.2)

## 2020-12-15 LAB — GLUCOSE, CAPILLARY
Glucose-Capillary: 165 mg/dL — ABNORMAL HIGH (ref 70–99)
Glucose-Capillary: 150 mg/dL — ABNORMAL HIGH (ref 70–99)
Glucose-Capillary: 299 mg/dL — ABNORMAL HIGH (ref 70–99)

## 2020-12-15 LAB — POCT ACTIVATED CLOTTING TIME
Activated Clotting Time: 275 seconds
Activated Clotting Time: 239 seconds

## 2020-12-15 SURGERY — LEFT HEART CATH AND CORONARY ANGIOGRAPHY
Anesthesia: Moderate Sedation

## 2020-12-15 MED ORDER — LIDOCAINE HCL 1 % IJ SOLN
INTRAMUSCULAR | Status: AC
  Filled 2020-12-15: qty 20

## 2020-12-15 MED ORDER — HEPARIN SODIUM (PORCINE) 1000 UNIT/ML IJ SOLN
INTRAMUSCULAR | Status: AC
  Filled 2020-12-15: qty 1

## 2020-12-15 MED ORDER — SODIUM CHLORIDE 0.9 % IV SOLN: 250.0000 mL | INTRAVENOUS | Status: DC | PRN

## 2020-12-15 MED ORDER — SODIUM CHLORIDE 0.9% FLUSH: 3.0000 mL | INTRAVENOUS | Status: DC | PRN

## 2020-12-15 MED ORDER — HEPARIN (PORCINE) IN NACL 1000-0.9 UT/500ML-% IV SOLN
INTRAVENOUS | Status: AC
  Filled 2020-12-15: qty 1000

## 2020-12-15 MED ORDER — HEPARIN SODIUM (PORCINE) 1000 UNIT/ML IJ SOLN
INTRAMUSCULAR | Status: DC | PRN
  Administered 2020-12-15: 1000 [IU] via INTRAVENOUS
  Administered 2020-12-15 (×2): 3000 [IU] via INTRAVENOUS

## 2020-12-15 MED ORDER — VERAPAMIL HCL 2.5 MG/ML IV SOLN
INTRAVENOUS | Status: AC
  Filled 2020-12-15: qty 2

## 2020-12-15 MED ORDER — CLOPIDOGREL BISULFATE 75 MG PO TABS
ORAL_TABLET | ORAL | Status: DC | PRN
  Administered 2020-12-15: 300 mg via ORAL

## 2020-12-15 MED ORDER — IOHEXOL 350 MG/ML SOLN
INTRAVENOUS | Status: DC | PRN
  Administered 2020-12-15: 121 mL

## 2020-12-15 MED ORDER — HEPARIN (PORCINE) IN NACL 2000-0.9 UNIT/L-% IV SOLN
INTRAVENOUS | Status: DC | PRN
  Administered 2020-12-15: 1000 mL

## 2020-12-15 MED ORDER — CLOPIDOGREL BISULFATE 300 MG PO TABS
ORAL_TABLET | ORAL | Status: AC
  Filled 2020-12-15: qty 1

## 2020-12-15 MED ORDER — HYDROCODONE-ACETAMINOPHEN 5-325 MG PO TABS
ORAL_TABLET | ORAL | Status: AC
  Filled 2020-12-15: qty 1

## 2020-12-15 MED ORDER — MIDAZOLAM HCL 2 MG/2ML IJ SOLN
INTRAMUSCULAR | Status: DC | PRN
  Administered 2020-12-15: 1 mg via INTRAVENOUS

## 2020-12-15 MED ORDER — NITROGLYCERIN 1 MG/10 ML FOR IR/CATH LAB
INTRA_ARTERIAL | Status: DC | PRN
  Administered 2020-12-15: 200 ug via INTRACORONARY

## 2020-12-15 MED ORDER — FENTANYL CITRATE (PF) 100 MCG/2ML IJ SOLN
INTRAMUSCULAR | Status: AC
  Filled 2020-12-15: qty 2

## 2020-12-15 MED ORDER — FENTANYL CITRATE (PF) 100 MCG/2ML IJ SOLN
INTRAMUSCULAR | Status: DC | PRN
  Administered 2020-12-15: 25 ug via INTRAVENOUS

## 2020-12-15 MED ORDER — MIDAZOLAM HCL 2 MG/2ML IJ SOLN
INTRAMUSCULAR | Status: AC
  Filled 2020-12-15: qty 2

## 2020-12-15 MED ORDER — VERAPAMIL HCL 2.5 MG/ML IV SOLN
INTRAVENOUS | Status: DC | PRN
  Administered 2020-12-15: 2.5 mg via INTRA_ARTERIAL

## 2020-12-15 MED ORDER — SODIUM CHLORIDE 0.9 % IV SOLN: INTRAVENOUS | Status: AC

## 2020-12-15 MED ORDER — SODIUM CHLORIDE 0.9% FLUSH
3.0000 mL | Freq: Two times a day (BID) | INTRAVENOUS | Status: DC
  Administered 2020-12-15 – 2020-12-16 (×2): 3 mL via INTRAVENOUS

## 2020-12-15 MED ORDER — LIDOCAINE HCL (PF) 1 % IJ SOLN
INTRAMUSCULAR | Status: DC | PRN
  Administered 2020-12-15: 2 mL

## 2020-12-15 MED ORDER — NITROGLYCERIN 1 MG/10 ML FOR IR/CATH LAB
INTRA_ARTERIAL | Status: AC
  Filled 2020-12-15: qty 10

## 2020-12-15 SURGICAL SUPPLY — 22 items
BALLN EUPHORA RX 2.0X15 (BALLOONS) ×2
BALLN ~~LOC~~ EUPHORA RX 2.75X15 (BALLOONS) ×2
BALLN ~~LOC~~ TREK RX 2.5X12 (BALLOONS) ×2
BALLOON EUPHORA RX 2.0X15 (BALLOONS) ×1 IMPLANT
BALLOON ~~LOC~~ EUPHORA RX 2.75X15 (BALLOONS) ×1 IMPLANT
BALLOON ~~LOC~~ TREK RX 2.5X12 (BALLOONS) ×1 IMPLANT
CATH INFINITI 5FR JK (CATHETERS) ×2 IMPLANT
CATH LAUNCHER 5F EBU3.5 (CATHETERS) ×2 IMPLANT
DEVICE RAD TR BAND REGULAR (VASCULAR PRODUCTS) ×2 IMPLANT
DRAPE BRACHIAL (DRAPES) ×2 IMPLANT
GLIDESHEATH SLEND SS 6F .021 (SHEATH) ×2 IMPLANT
GUIDEWIRE INQWIRE 1.5J.035X260 (WIRE) ×1 IMPLANT
INQWIRE 1.5J .035X260CM (WIRE) ×2
KIT ENCORE 26 ADVANTAGE (KITS) ×2 IMPLANT
PACK CARDIAC CATH (CUSTOM PROCEDURE TRAY) ×2 IMPLANT
PROTECTION STATION PRESSURIZED (MISCELLANEOUS) ×2
SET ATX SIMPLICITY (MISCELLANEOUS) ×2 IMPLANT
STATION PROTECTION PRESSURIZED (MISCELLANEOUS) ×1 IMPLANT
STENT ONYX FRONTIER 2.25X18 (Permanent Stent) ×2 IMPLANT
STENT ONYX FRONTIER 2.75X22 (Permanent Stent) ×2 IMPLANT
TUBING CIL FLEX 10 FLL-RA (TUBING) ×2 IMPLANT
WIRE RUNTHROUGH .014X180CM (WIRE) ×2 IMPLANT

## 2020-12-15 NOTE — Consult Note (Signed)
[facility] Nurse Consult Note: Reason for Consult:Patient with critical limb ischemia (ABI 0.4 on right, 0.3 on left). Linear purple area of skin discoloration noted to left heel. Wound type: DTPI vs mottling vs reperfusion injury Pressure Injury POA: Yes/No/NA Measurement:0.3cm in length x 0.1cm width Wound bed:N/A Drainage (amount, consistency, odor) N/A Periwound: cool, areas of mottling Dressing procedure/placement/frequency: Patient is under the care of cardiology and vascular surgery; she is known to their services for PAD, now with critical limb ischemia. Patient is at high risk for limb loss. Preventive measures in place to prevent skin injury include floatation of heels, prophylactic foam dressings, antimicrobial nonadherent dressing to left heel, turning and repositioning.  [place] nursing team will not follow, but will remain available to this patient, the nursing and medical teams.  Please re-consult if needed. Thanks, Maudie Flakes, MSN, RN, [place], Arther Abbott  Pager# [phone]

## 2020-12-15 NOTE — Interval H&P Note (Signed)
History and Physical Interval Note:  12/15/2020 12:46 PM  Beth Carroll  has presented today for surgery, with the diagnosis of acute systolic congestive heart failure.  The various methods of treatment have been discussed with the patient and family. After consideration of risks, benefits and other options for treatment, the patient has consented to  Procedure(s): LEFT HEART CATH AND CORONARY ANGIOGRAPHY (N/A) as a surgical intervention.  The patient's history has been reviewed, patient examined, no change in status, stable for surgery.  I have reviewed the patient's chart and labs.  Questions were answered to the patient's satisfaction.     Beth Carroll

## 2020-12-15 NOTE — Progress Notes (Signed)
PROGRESS NOTE    Beth Carroll  EGB:[id number] DOB: 04-May-1970 DOA: 12/09/2020 PCP: Baxter Hire, MD    Chief Complaint  Patient presents with   Edema    Brief Narrative:    Beth Carroll is a 50 y.o. female with medical history significant for DM, HTN, bilateral lower extremity edema for the past 3 months with negative venous doppler on 10/26, and with little response to empiric Lasix, compression elevated who presents to the emergency room with a several day history of nausea, poor appetite and dizziness on standing.  Reports lower extremity edema despite being on Lasix,   ABIs done in the ED 0.3, correlating with decreased capillary refill.  She was seen by vascular surgery, work-up significant for severe peripheral arterial disease, she was admitted for further work-up.     Assessment & Plan:   Principal Problem:   Postural dizziness with presyncope Active Problems:   Hypertension   Rest pain of both lower extremities due to atherosclerosis (HCC)   Hyperglycemia due to type 2 diabetes mellitus (HCC)   Hypokalemia   Bilateral lower extremity edema   Acute systolic heart failure (HCC)   Severe peripheral arterial disease with rest pain -Her ABI in the critical range, she is with severe dependent edema. -Vascular surgery consult greatly appreciated, patient S/P   1.  Ultrasound guidance for vascular access right femoral artery             2.  Catheter placement into left common femoral artery from right femoral approach             3.  Aortogram and selective left lower extremity angiogram             4.  Percutaneous transluminal angioplasty of left anterior tibial artery with 2.5 mm angioplasty balloon             5.  Percutaneous transluminal angioplasty of left posterior tibial artery with 2.5 mm diameter angioplasty balloon             6.  Mechanical thrombectomy to the left SFA and popliteal arteries with the Rota Rex device to debulk chronic thrombus              7.  Angioplasty of the left SFA and popliteal arteries with 4 mm diameter by 30 cm length and 4 mm diameter by 15 cm length Lutonix drug-coated angioplasty balloons             8.  Viabahn stent placement x2 to the left SFA and above-knee popliteal arteries with a 6 mm diameter by 25 cm length stent and 6 mm diameter by 7.5 cm length stent             9.  StarClose closure device right femoral artery  -On heparin GTT, currently transitioned to aspirin and Plavix as discussed with vascular surgery -Plan for right lower extremity intervention next week, more likely on Wednesday as she will need to have her coronary intervention first.  Acute systolic CHF, -Cardiology input greatly appreciated, EKG concerning for possible prior anterior MI. -Started on low-dose Coreg.  -Very likely in the setting of ischemic cardiomyopathy, plan for cardiac cath today.  To right lower extremity intervention.      Postural dizziness with presyncope - Continue to monitor on telemetry.  Type 2 diabetes mellitus, poorly controlled with hyperglycemia . -A1c is elevated at 13.7  -Continue with Semglee 22 units,3 units of NovoLog 3 times daily as well.,  Continue  with insulin sliding scale.    Severe hypokalemia -Repleted, continue to monitor closely  Tobacco abuse -She was counseled.     Hypertension - She is not on any medications at home, now blood pressure started to increase she is on Coreg for her CHF.  But overall blood pressure remains soft to add any new cardiac medications.     DVT prophylaxis: Subcu Lovenox Code Status: Full Family Communication: Discussed with husband at bedside Disposition:   Status is: Inpatient  Remains inpatient appropriate because: Vascular intervention, on heparin GTT       Consultants:  Vascular surgery  Cardiology  Subjective:  Left lower extremity pain is controlled, no chest pain, no shortness of breath. Objective: Vitals:   12/15/20 0505 12/15/20 0654  12/15/20 0759 12/15/20 1110  BP: 130/80  136/77 (!) 154/84  Pulse: 73  72 71  Resp: 14  20 15   Temp: 98.7 F (37.1 C)  98.7 F (37.1 C) 98.4 F (36.9 C)  TempSrc:    Oral  SpO2: 100%  100% 100%  Weight:  49.2 kg  49.2 kg  Height:    5\' 6"  (1.676 m)    Intake/Output Summary (Last 24 hours) at 12/15/2020 1253 Last data filed at 12/14/2020 2100 Gross per 24 hour  Intake 840 ml  Output 1000 ml  Net -160 ml   Filed Weights   12/14/20 0541 12/15/20 0654 12/15/20 1110  Weight: 48.5 kg 49.2 kg 49.2 kg    Examination:  Awake Alert, Oriented X 3, No new F.N deficits, Normal affect Symmetrical Chest wall movement, Good air movement bilaterally, CTAB RRR,No Gallops,Rubs or new Murmurs, No Parasternal Heave +ve B.Sounds, Abd Soft, No tenderness, No rebound - guarding or rigidity.  Has diminished pulses bilaterally, left foot with improving edema and erythema,      Data Reviewed: I have personally reviewed following labs and imaging studies  CBC: Recent Labs  Lab 12/11/20 0138 12/12/20 0532 12/13/20 0352 12/14/20 0558 12/15/20 0541  WBC 4.5 4.8 4.7 3.6* 3.8*  HGB 10.9* 11.1* 9.9* 10.1* 9.9*  HCT 31.5* 33.6* 30.5* 30.6* 30.6*  MCV 85.8 88.9 88.7 89.7 90.0  PLT 145* 131* 125* 137* 850    Basic Metabolic Panel: Recent Labs  Lab 12/10/20 0509 12/10/20 1840 12/11/20 0138 12/12/20 0532 12/13/20 0352 12/14/20 0558  NA  --  137 133* 138 134* 135  K  --  3.1* 3.1* 4.0 3.7 4.2  CL  --  102 101 103 99 101  CO2  --  30 27 29 30 30   GLUCOSE  --  320* 477* 183* 238* 217*  BUN  --  19 17 12 10 7   CREATININE  --  0.64 0.60 0.31* 0.40* <0.30*  CALCIUM  --  8.5* 8.2* 8.6* 8.5* 8.5*  MG 2.0  --   --   --   --   --     GFR: CrCl cannot be calculated (This lab value cannot be used to calculate CrCl because it is not a number: <0.30).  Liver Function Tests: Recent Labs  Lab 12/09/20 1501  AST 15  ALT 12  ALKPHOS 59  BILITOT 2.2*  PROT 7.4  ALBUMIN 3.9     CBG: Recent Labs  Lab 12/14/20 1228 12/14/20 1621 12/14/20 2051 12/15/20 0802 12/15/20 1118  GLUCAP 186* 152* 251* 165* 150*     Recent Results (from the past 240 hour(s))  Resp Panel by RT-PCR (Flu A&B, Covid) Nasopharyngeal Swab     Status: None  Collection Time: 12/10/20  8:20 AM   Specimen: Nasopharyngeal Swab; Nasopharyngeal(NP) swabs in vial transport medium  Result Value Ref Range Status   SARS Coronavirus 2 by RT PCR NEGATIVE NEGATIVE Final    Comment: (NOTE) SARS-CoV-2 target nucleic acids are NOT DETECTED.  The SARS-CoV-2 RNA is generally detectable in upper respiratory specimens during the acute phase of infection. The lowest concentration of SARS-CoV-2 viral copies this assay can detect is 138 copies/mL. A negative result does not preclude SARS-Cov-2 infection and should not be used as the sole basis for treatment or other patient management decisions. A negative result may occur with  improper specimen collection/handling, submission of specimen other than nasopharyngeal swab, presence of viral mutation(s) within the areas targeted by this assay, and inadequate number of viral copies(<138 copies/mL). A negative result must be combined with clinical observations, patient history, and epidemiological information. The expected result is Negative.  Fact Sheet for Patients:  EntrepreneurPulse.com.au  Fact Sheet for Healthcare Providers:  IncredibleEmployment.be  This test is no t yet approved or cleared by the [place] FDA and  has been authorized for detection and/or diagnosis of SARS-CoV-2 by FDA under an Emergency Use Authorization (EUA). This EUA will remain  in effect (meaning this test can be used) for the duration of the COVID-19 declaration under Section 564(b)(1) of the Act, 21 U.S.C.section 360bbb-3(b)(1), unless the authorization is terminated  or revoked sooner.       Influenza A by PCR NEGATIVE  NEGATIVE Final   Influenza B by PCR NEGATIVE NEGATIVE Final    Comment: (NOTE) The Xpert Xpress SARS-CoV-2/FLU/RSV plus assay is intended as an aid in the diagnosis of influenza from Nasopharyngeal swab specimens and should not be used as a sole basis for treatment. Nasal washings and aspirates are unacceptable for Xpert Xpress SARS-CoV-2/FLU/RSV testing.  Fact Sheet for Patients: EntrepreneurPulse.com.au  Fact Sheet for Healthcare Providers: IncredibleEmployment.be  This test is not yet approved or cleared by the [place] FDA and has been authorized for detection and/or diagnosis of SARS-CoV-2 by FDA under an Emergency Use Authorization (EUA). This EUA will remain in effect (meaning this test can be used) for the duration of the COVID-19 declaration under Section 564(b)(1) of the Act, 21 U.S.C. section 360bbb-3(b)(1), unless the authorization is terminated or revoked.  Performed at [facility], [address], [place], [place] [address]          Radiology Studies: No results found.      Scheduled Meds:  [MAR Hold] aspirin EC  81 mg Oral Daily   [MAR Hold] atorvastatin  20 mg Oral Daily   [MAR Hold] carvedilol  3.125 mg Oral BID WC   [MAR Hold] clopidogrel  75 mg Oral Daily   [MAR Hold] enoxaparin (LOVENOX) injection  40 mg Subcutaneous Q24H   [MAR Hold] insulin aspart  0-15 Units Subcutaneous TID WC   [MAR Hold] insulin aspart  0-5 Units Subcutaneous QHS   [MAR Hold] insulin aspart  3 Units Subcutaneous TID WC   [MAR Hold] insulin glargine-yfgn  25 Units Subcutaneous Daily   [MAR Hold] pantoprazole  40 mg Oral Daily   sodium chloride flush  3 mL Intravenous Q12H   Continuous Infusions:  sodium chloride     sodium chloride 1,000 mL (12/15/20 1121)      LOS: 5 days        Phillips Climes, MD [facility]   To contact the attending provider between 7A-7P or the covering provider during after  hours  7P-7A, please log into the web site [email or web address] and access using universal [place] password for that web site. If you do not have the password, please call the hospital operator.  12/15/2020, 12:53 PM

## 2020-12-15 NOTE — Progress Notes (Signed)
Progress Note  Patient Name: Beth Carroll Date of Encounter: 12/15/2020  [facility] Cardiologist: [place]  Subjective   The patient underwent cardiac catheterization today via the right radial artery which showed severe two-vessel coronary artery disease with subtotal occlusion of the mid LAD and significant stenosis in proximal ramus artery.  I performed successful angioplasty and drug-eluting stent placement to both LAD and ramus.  Inpatient Medications    Scheduled Meds:  [MAR Hold] aspirin EC  81 mg Oral Daily   [MAR Hold] atorvastatin  20 mg Oral Daily   [MAR Hold] carvedilol  3.125 mg Oral BID WC   [MAR Hold] clopidogrel  75 mg Oral Daily   [MAR Hold] enoxaparin (LOVENOX) injection  40 mg Subcutaneous Q24H   [MAR Hold] insulin aspart  0-15 Units Subcutaneous TID WC   [MAR Hold] insulin aspart  0-5 Units Subcutaneous QHS   [MAR Hold] insulin aspart  3 Units Subcutaneous TID WC   [MAR Hold] insulin glargine-yfgn  25 Units Subcutaneous Daily   [MAR Hold] pantoprazole  40 mg Oral Daily   sodium chloride flush  3 mL Intravenous Q12H   Continuous Infusions:  sodium chloride     sodium chloride     PRN Meds: sodium chloride, [MAR Hold] acetaminophen **OR** [MAR Hold] acetaminophen, clopidogrel, [MAR Hold] hydrALAZINE, [MAR Hold] HYDROcodone-acetaminophen, iohexol, [MAR Hold]  morphine injection, [MAR Hold] ondansetron **OR** [MAR Hold] ondansetron (ZOFRAN) IV, sodium chloride flush   Vital Signs    Vitals:   12/15/20 1403 12/15/20 1415 12/15/20 1430 12/15/20 1445  BP: (!) 150/86 (!) 158/89 (!) 157/83 (!) 125/103  Pulse:   75 76  Resp:  19 10 14   Temp:      TempSrc:      SpO2:   99% 100%  Weight:      Height:        Intake/Output Summary (Last 24 hours) at 12/15/2020 1506 Last data filed at 12/14/2020 2100 Gross per 24 hour  Intake 720 ml  Output 1000 ml  Net -280 ml    Last 3 Weights 12/15/2020 12/15/2020 12/14/2020  Weight (lbs) 108 lb 7.5 oz 108  lb 7.5 oz 106 lb 14.8 oz  Weight (kg) 49.2 kg 49.2 kg 48.5 kg      Telemetry    Normal sinus rhythm- Personally Reviewed  ECG     - Personally Reviewed  Physical Exam   GEN: No acute distress.   Neck: No JVD Cardiac: RRR, no murmurs, rubs, or gallops.  Decreased pulses lower extremities Respiratory: Clear to auscultation bilaterally. GI: Soft, nontender, non-distended  MS: No edema; No deformity. Neuro:  Nonfocal  Psych: Normal affect   Labs    High Sensitivity Troponin:   Recent Labs  Lab 12/09/20 1501 12/09/20 1815  TROPONINIHS 28* 27*      Chemistry Recent Labs  Lab 12/09/20 1501 12/09/20 2056 12/10/20 0509 12/10/20 1840 12/12/20 0532 12/13/20 0352 12/14/20 0558  NA 132*   < >  --    < > 138 134* 135  K 2.4*   < >  --    < > 4.0 3.7 4.2  CL 87*   < >  --    < > 103 99 101  CO2 23   < >  --    < > 29 30 30   GLUCOSE 392*   < >  --    < > 183* 238* 217*  BUN 25*   < >  --    < >  12 10 7   CREATININE 0.92   < >  --    < > 0.31* 0.40* <0.30*  CALCIUM 9.2   < >  --    < > 8.6* 8.5* 8.5*  MG  --   --  2.0  --   --   --   --   PROT 7.4  --   --   --   --   --   --   ALBUMIN 3.9  --   --   --   --   --   --   AST 15  --   --   --   --   --   --   ALT 12  --   --   --   --   --   --   ALKPHOS 59  --   --   --   --   --   --   BILITOT 2.2*  --   --   --   --   --   --   GFRNONAA >60   < >  --    < > >60 >60 NOT CALCULATED  ANIONGAP 22*   < >  --    < > 6 5 4*   < > = values in this interval not displayed.     Lipids  Recent Labs  Lab 12/12/20 0532  CHOL 120  TRIG 59  HDL 45  LDLCALC 63  CHOLHDL 2.7     Hematology Recent Labs  Lab 12/13/20 0352 12/14/20 0558 12/15/20 0541  WBC 4.7 3.6* 3.8*  RBC 3.44* 3.41* 3.40*  HGB 9.9* 10.1* 9.9*  HCT 30.5* 30.6* 30.6*  MCV 88.7 89.7 90.0  MCH 28.8 29.6 29.1  MCHC 32.5 33.0 32.4  RDW 12.9 12.9 12.9  PLT 125* 137* 155    Thyroid No results for input(s): TSH, FREET4 in the last 168 hours.   BNP Recent Labs  Lab 12/09/20 1815  BNP 106.3*     DDimer No results for input(s): DDIMER in the last 168 hours.   Radiology    CARDIAC CATHETERIZATION  Result Date: 12/15/2020   Mid LAD lesion is 99% stenosed.   Ramus lesion is 90% stenosed.   Mid LAD to Dist LAD lesion is 40% stenosed.   A drug-eluting stent was successfully placed using a STENT ONYX FRONTIER 2.25X18.   A drug-eluting stent was successfully placed using a STENT ONYX FRONTIER 2.75X22.   Post intervention, there is a 0% residual stenosis.   Post intervention, there is a 0% residual stenosis.   The left ventricular systolic function is normal.   LV end diastolic pressure is normal.   The left ventricular ejection fraction is 35-45% by visual estimate. 1.  Severe two-vessel coronary artery disease with subtotal occlusion of the mid LAD as well as significant stenosis in proximal ramus.  The LAD is diffusely diseased and small in size in the mid to distal segment. 2.  Moderately reduced LV systolic function with akinesis of mid to distal anterior and apical myocardium.  Normal left ventricular end-diastolic pressure. 3.  Successful angioplasty and drug-eluting stent placement to the mid LAD as well as proximal ramus. Recommendations: Dual antiplatelet therapy for at least 6 months. Aggressive treatment of risk factors.    Cardiac Studies    Echo  1. Left ventricular ejection fraction, by estimation, is 40 to 45%. The  left ventricle has mildly decreased function.   Patient Profile  50 y.o. female with history of DM2, HTN, and lower extremity swelling and ongoing tobacco use who is being seen today for the evaluation of cardiomyopathy  Assessment & Plan    A/P: PAD/critical limb ischemia long smoking history, poorly controlled diabetes A1c of 13  intervention left lower extremity December 10 2020 we will continue -The patient has significant disease affecting the right lower extremity although she does not seem to  be significantly symptomatic at the present time.  Plans per vascular surgery. -Currently on aspirin, Plavix, statin/Lipitor 20  Cardiomyopathy, likely ischemic acute systolic heart failure due to ischemic cardiomyopathy: Wall motion abnormality due to subtotal occlusion of the mid LAD.  This was treated successfully with drug-eluting stent placement although recovery of LV myocardium in that area is not guaranteed.  In addition, I placed a drug-eluting stent to the proximal ramus.  No other obstructive disease. She is already on dual antiplatelet therapy with aspirin and clopidogrel. Recommend aggressive treatment of risk factors and smoking cessation.   Diabetes type 2 with complications, poorly controlled A1c 13.7 Insulin sliding scale, will need aggressive intervention Needs outpatient follow-up with endocrinology     For questions or updates, please contact [place] Please consult [email or web address] for contact info under        Signed, Kathlyn [place], MD  12/15/2020, 3:06 PM

## 2020-12-15 NOTE — Progress Notes (Signed)
PT Cancellation Note  Patient Details Name: Beth Carroll MRN: [id number] DOB: [birth year]   Cancelled Treatment:    Reason Eval/Treat Not Completed: Patient at procedure or test/unavailable Attempted to see pt for PT tx but pt not in room & off unit for procedure. Will f/u as able.  Lavone Nian, PT, DPT 12/15/20, 2:14 PM    [name] 12/15/2020, 2:14 PM

## 2020-12-16 ENCOUNTER — Telehealth (INDEPENDENT_AMBULATORY_CARE_PROVIDER_SITE_OTHER): Payer: Self-pay

## 2020-12-16 ENCOUNTER — Encounter: Payer: Self-pay | Admitting: Cardiovascular Disease

## 2020-12-16 DIAGNOSIS — I5021 Acute systolic (congestive) heart failure: Secondary | ICD-10-CM | POA: Diagnosis not present

## 2020-12-16 DIAGNOSIS — I255 Ischemic cardiomyopathy: Secondary | ICD-10-CM

## 2020-12-16 DIAGNOSIS — I998 Other disorder of circulatory system: Secondary | ICD-10-CM | POA: Diagnosis not present

## 2020-12-16 LAB — BASIC METABOLIC PANEL
Anion gap: 4 — ABNORMAL LOW (ref 5–15)
BUN: 9 mg/dL (ref 6–20)
CO2: 29 mmol/L (ref 22–32)
Calcium: 8.5 mg/dL — ABNORMAL LOW (ref 8.9–10.3)
Chloride: 103 mmol/L (ref 98–111)
Creatinine, Ser: 0.43 mg/dL — ABNORMAL LOW (ref 0.44–1.00)
GFR, Estimated: >60 mL/min (ref >60)
Glucose, Bld: 290 mg/dL — ABNORMAL HIGH (ref 70–99)
Potassium: 4.2 mmol/L (ref 3.5–5.1)
Sodium: 136 mmol/L (ref 135–145)

## 2020-12-16 LAB — CBC
HCT: 30.5 % — ABNORMAL LOW (ref 36.0–46.0)
Hemoglobin: 10 g/dL — ABNORMAL LOW (ref 12.0–15.0)
MCH: 29 pg (ref 26.0–34.0)
MCHC: 32.8 g/dL (ref 30.0–36.0)
MCV: 88.4 fL (ref 80.0–100.0)
Platelets: 184 10*3/uL (ref 150–400)
RBC: 3.45 MIL/uL — ABNORMAL LOW (ref 3.87–5.11)
RDW: 12.7 % (ref 11.5–15.5)
WBC: 3.6 10*3/uL — ABNORMAL LOW (ref 4.0–10.5)
nRBC: 0 % (ref 0.0–0.2)

## 2020-12-16 LAB — GLUCOSE, CAPILLARY
Glucose-Capillary: 278 mg/dL — ABNORMAL HIGH (ref 70–99)
Glucose-Capillary: 238 mg/dL — ABNORMAL HIGH (ref 70–99)

## 2020-12-16 MED ORDER — ATORVASTATIN CALCIUM 20 MG PO TABS: 20.0000 mg | ORAL_TABLET | Freq: Every day | ORAL | 0 refills | Status: DC

## 2020-12-16 MED ORDER — PANTOPRAZOLE SODIUM 40 MG PO TBEC: 40.0000 mg | DELAYED_RELEASE_TABLET | Freq: Every day | ORAL | 0 refills | Status: AC

## 2020-12-16 MED ORDER — ASPIRIN 81 MG PO TBEC: 81.0000 mg | DELAYED_RELEASE_TABLET | Freq: Every day | ORAL | 2 refills | Status: DC

## 2020-12-16 MED ORDER — HYDROCODONE-ACETAMINOPHEN 5-325 MG PO TABS: 1.0000 | ORAL_TABLET | Freq: Four times a day (QID) | ORAL | 0 refills | Status: DC | PRN

## 2020-12-16 MED ORDER — CARVEDILOL 3.125 MG PO TABS: 3.1250 mg | ORAL_TABLET | Freq: Two times a day (BID) | ORAL | 0 refills | Status: DC

## 2020-12-16 MED ORDER — CLOPIDOGREL BISULFATE 75 MG PO TABS: 75.0000 mg | ORAL_TABLET | Freq: Every day | ORAL | 1 refills | Status: DC

## 2020-12-16 NOTE — Discharge Instructions (Addendum)
Follow with Primary MD Baxter Hire, MD in 7 days   Get CBC, CMP,  checked  by Primary MD next visit.    Activity: As tolerated with Full fall precautions use walker/cane & assistance as needed   Disposition Home    Diet: Heart Healthy /Carb modified with feeding assistance and aspiration precautions.  On your next visit with your primary care physician please Get Medicines reviewed and adjusted.   Please request your Prim.MD to go over all Hospital Tests and Procedure/Radiological results at the follow up, please get all Hospital records sent to your Prim MD by signing hospital release before you go home.   If you experience worsening of your admission symptoms, develop shortness of breath, life threatening emergency, suicidal or homicidal thoughts you must seek medical attention immediately by calling 911 or calling your MD immediately  if symptoms less severe.  You Must read complete instructions/literature along with all the possible adverse reactions/side effects for all the Medicines you take and that have been prescribed to you. Take any new Medicines after you have completely understood and accpet all the possible adverse reactions/side effects.   Do not drive, operating heavy machinery, perform activities at heights, swimming or participation in water activities or provide baby sitting services if your were admitted for syncope or siezures until you have seen by Primary MD or a Neurologist and advised to do so again.  Do not drive when taking Pain medications.    Do not take more than prescribed Pain, Sleep and Anxiety Medications  Special Instructions: If you have smoked or chewed Tobacco  in the last 2 yrs please stop smoking, stop any regular Alcohol  and or any Recreational drug use.  Wear Seat belts while driving.   Please note  You were cared for by a hospitalist during your hospital stay. If you have any questions about your discharge medications or the care  you received while you were in the hospital after you are discharged, you can call the unit and asked to speak with the hospitalist on call if the hospitalist that took care of you is not available. Once you are discharged, your primary care physician will handle any further medical issues. Please note that NO REFILLS for any discharge medications will be authorized once you are discharged, as it is imperative that you return to your primary care physician (or establish a relationship with a primary care physician if you do not have one) for your aftercare needs so that they can reassess your need for medications and monitor your lab values.

## 2020-12-16 NOTE — Progress Notes (Signed)
Inpatient Diabetes Program Recommendations  AACE/ADA: New Consensus Statement on Inpatient Glycemic Control   Target Ranges:  Prepandial:   less than 140 mg/dL      Peak postprandial:   less than 180 mg/dL (1-2 hours)      Critically ill patients:  140 - 180 mg/dL    Latest Reference Range & Units 12/15/20 08:02 12/15/20 11:18 12/15/20 21:35 12/16/20 08:27  Glucose-Capillary 70 - 99 mg/dL 165 (H) 150 (H) 299 (H)  Novolog 3 units   278 (H)  Novolog 11 units  Semglee 25 units  (H): Data is abnormally high  Review of Glycemic Control  Current orders for Inpatient glycemic control: Semglee 25 units daily, Novolog 0-15 units TID with meals, Novolog 0-5 units QHS, Novolog 3 units TID with meals  Inpatient Diabetes Program Recommendations:    Insulin: Noted patient did NOT receive Semglee on 12/15/20 . As a result, glucose was up to 299 mg/dl at bedtime last night and fasting CBG 278 mg/dl today. Would not recommend any changes with basal insulin at this time.  Thanks, Barnie Alderman, RN, MSN, CDE Diabetes Coordinator Inpatient Diabetes Program [phone] (Team Pager from 8am to 5pm)

## 2020-12-16 NOTE — Telephone Encounter (Signed)
Spoke with the patient and she is scheduled with Dr. Lucky Cowboy for a RLE angio on 12/25/20 with a 9:45 am arrival time to the MM. Pre-procedure instructions were discussed and will be mailed.

## 2020-12-16 NOTE — Clinical Social Work Note (Signed)
  Occupational Therapy * Physical Therapy * Speech Therapy          DATE _____11/22/22_________________  PATIENT NAME____[name]___________ PATIENT MRN___[id number]_________________  DIAGNOSIS/DIAGNOSIS CODE __Postural dizziness with presyncope_R42, R55___________________  DATE OF DISCHARGE: ____11/22/22__________  PRIMARY CARE PHYSICIAN ____John Johnston_________      PCP PHONE/FAX______[phone]_______________  Dear Provider (Name: _______________________________   Fax: ___________________________): I certify that I have examined this patient and that occupational/physical/speech therapy is necessary on an outpatient basis.    The patient has expressed interest in completing their recommended course of therapy at your  location.  Once a formal order from the patient's primary care physician has been obtained, please contact him/her to schedule an appointment for evaluation at your earliest convenience.   [ X ]  Physical Therapy Evaluate and Treat          [ X ]  Occupational Therapy Evaluate and Treat                       [  ]  Speech Therapy Evaluate and Treat         The patient's primary care physician (listed above) must furnish and be responsible for a formal order such that the recommended services may be furnished while under the primary physician's care, and that the plan of care will be established and reviewed every 30 days (or more often if condition necessitates).  MD ELECTRONIC SIGNATURE NOTED BELOW

## 2020-12-16 NOTE — Discharge Summary (Signed)
Physician Discharge Summary  Beth Carroll IEP:[id number] DOB: March 18, 1970 DOA: 12/09/2020  PCP: Baxter Hire, MD  Admit date: 12/09/2020 Discharge date: 12/16/2020  Admitted From: Home Disposition:  Home   Recommendations for Outpatient Follow-up:  Follow up with PCP in 1-2 weeks Please obtain BMP/CBC in one week Please follow up with vascular surgery next week regarding revascularization of right lower extremity, you will be updated by appointment and schedule Patient to follow with cardiology as an outpatient, appointment will be arranged by [facility].  Home Health:YES Equipment/Devices:walker  Discharge Condition:Stable CODE STATUS:FULL Diet recommendation: Heart Healthy  Brief/Interim Summary:  Beth Carroll is a 50 y.o. female with medical history significant for DM, HTN, bilateral lower extremity edema for the past 3 months with negative venous doppler on 10/26, and with little response to empiric Lasix, compression elevated who presents to the emergency room with a several day history of nausea, poor appetite and dizziness on standing.  Reports lower extremity edema despite being on Lasix,   ABIs done in the ED 0.3, correlating with decreased capillary refill.  She was seen by vascular surgery, work-up significant for severe peripheral arterial disease, she was admitted for further work-up.     Severe peripheral arterial disease with rest pain -Her ABI in the critical range, she is with severe dependent edema. -Vascular surgery consult greatly appreciated, patient S/P    1.  Ultrasound guidance for vascular access right femoral artery             2.  Catheter placement into left common femoral artery from right femoral approach             3.  Aortogram and selective left lower extremity angiogram             4.  Percutaneous transluminal angioplasty of left anterior tibial artery with 2.5 mm angioplasty balloon             5.  Percutaneous transluminal angioplasty of  left posterior tibial artery with 2.5 mm diameter angioplasty balloon             6.  Mechanical thrombectomy to the left SFA and popliteal arteries with the Rota Rex device to debulk chronic thrombus             7.  Angioplasty of the left SFA and popliteal arteries with 4 mm diameter by 30 cm length and 4 mm diameter by 15 cm length Lutonix drug-coated angioplasty balloons             8.  Viabahn stent placement x2 to the left SFA and above-knee popliteal arteries with a 6 mm diameter by 25 cm length stent and 6 mm diameter by 7.5 cm length stent             9.  StarClose closure device right femoral artery   -On heparin GTT initially, then she is transition to aspirin and Plavix, she will be discharged on aspirin and Plavix.  -Plan for right lower extremity intervention next week as an outpatient.    Acute systolic CHF, ischemic cardiomyopathy -Cardiology input greatly appreciated, EKG concerning for possible prior anterior MI. -Started on low-dose Coreg.  Patient remains soft on the lower side, no room to add Entresto, Imdur or hydralazine for now, this can be added as an outpatient if blood pressure is elevated. -She will need repeat echo in 2 to 3 months to reassess for EF improvement. - Echo showed LVEF 40-45%, G2DD and patient underwent  cath s/p stenting   CAD s/p PCI to LAD and Ramus - s/p cath for CM which showed subtotal occlusion of the mLAD which was treated with DES to proximal ramus and LAD - Continue DAPT with ASA and Plavix for at least 6 month per cardiology. - continue Lipitor 20mg  daily and Coreg 3.125mg BID    Postural dizziness with presyncope - Continue to monitor on telemetry.   Type 2 diabetes mellitus, poorly controlled with hyperglycemia . -A1c is elevated at 13.7  -Resume home medicine on discharge  Severe hypokalemia -Repleted, continue to monitor closely   Tobacco abuse -She was counseled.     Hypertension - Overall blood pressure has been soft during  hospital stay, she is started on Coreg, blood pressure on the lower side, so lisinopril and hydrochlorothiazide were discontinued on discharge.    Discharge Diagnoses:  Principal Problem:   Postural dizziness with presyncope Active Problems:   Hypertension   Rest pain of both lower extremities due to atherosclerosis (HCC)   Hyperglycemia due to type 2 diabetes mellitus (HCC)   Hypokalemia   Bilateral lower extremity edema   Acute systolic heart failure [facility])    Discharge Instructions  Discharge Instructions     AMB Referral to Cardiac Rehabilitation - Phase II   Complete by: As directed    Diagnosis:  NSTEMI Coronary Stents     After initial evaluation and assessments completed: Virtual Based Care may be provided alone or in conjunction with Phase 2 Cardiac Rehab based on patient barriers.: Yes   Diet - low sodium heart healthy   Complete by: As directed    Increase activity slowly   Complete by: As directed    No wound care   Complete by: As directed       Allergies as of 12/16/2020   No Known Allergies      Medication List     STOP taking these medications    hydrochlorothiazide 25 MG tablet Commonly known as: HYDRODIURIL   lisinopril 40 MG tablet Commonly known as: ZESTRIL   traMADol 50 MG tablet Commonly known as: ULTRAM       TAKE these medications    albuterol 108 (90 Base) MCG/ACT inhaler Commonly known as: VENTOLIN HFA Inhale 2 puffs into the lungs every 6 (six) hours as needed.   aspirin 81 MG EC tablet Take 1 tablet (81 mg total) by mouth daily. Swallow whole. Start taking on: December 17, 2020   atorvastatin 20 MG tablet Commonly known as: LIPITOR Take 1 tablet (20 mg total) by mouth daily. Start taking on: December 17, 2020   carvedilol 3.125 MG tablet Commonly known as: COREG Take 1 tablet (3.125 mg total) by mouth 2 (two) times daily with a meal.   clopidogrel 75 MG tablet Commonly known as: PLAVIX Take 1 tablet (75 mg total)  by mouth daily. Start taking on: December 17, 2020   furosemide 20 MG tablet Commonly known as: LASIX Take 20 mg by mouth daily.   Lantus SoloStar 100 UNIT/ML Solostar Pen Generic drug: insulin glargine Inject 25 Units into the skin daily.   pantoprazole 40 MG tablet Commonly known as: PROTONIX Take 1 tablet (40 mg total) by mouth daily. Start taking on: December 17, 2020   Vitamin D (Ergocalciferol) 1.25 MG (50000 UNIT) Caps capsule Commonly known as: DRISDOL Take 50,000 Units by mouth once a week.        Follow-up Information     Baxter Hire, MD Follow up.  Specialty: Internal Medicine Contact information: [place] [address] [phone]                No Known Allergies  Consultations: Vascular surgery Cardiology   Procedures/Studies: DG Chest 2 View  Result Date: 12/09/2020 CLINICAL DATA:  Bilateral lower extremity swelling. History high blood pressure and diabetes S. EXAM: CHEST - 2 VIEW COMPARISON:  None. FINDINGS: The cardiac silhouette, mediastinal and hilar contours are normal. The lungs are clear of an acute process. Mild hyperinflation. No pleural effusions or pulmonary lesions. The bony thorax is intact. IMPRESSION: No acute cardiopulmonary findings. Electronically Signed   By: Marijo Sanes M.D.   On: 12/09/2020 15:37   CARDIAC CATHETERIZATION  Result Date: 12/15/2020   Mid LAD lesion is 99% stenosed.   Ramus lesion is 90% stenosed.   Mid LAD to Dist LAD lesion is 40% stenosed.   A drug-eluting stent was successfully placed using a STENT ONYX FRONTIER 2.25X18.   A drug-eluting stent was successfully placed using a STENT ONYX FRONTIER 2.75X22.   Post intervention, there is a 0% residual stenosis.   Post intervention, there is a 0% residual stenosis.   The left ventricular systolic function is normal.   LV end diastolic pressure is normal.   The left ventricular ejection fraction is 35-45% by visual estimate. 1.  Severe  two-vessel coronary artery disease with subtotal occlusion of the mid LAD as well as significant stenosis in proximal ramus.  The LAD is diffusely diseased and small in size in the mid to distal segment. 2.  Moderately reduced LV systolic function with akinesis of mid to distal anterior and apical myocardium.  Normal left ventricular end-diastolic pressure. 3.  Successful angioplasty and drug-eluting stent placement to the mid LAD as well as proximal ramus. Recommendations: Dual antiplatelet therapy for at least 6 months. Aggressive treatment of risk factors.   PERIPHERAL VASCULAR CATHETERIZATION  Result Date: 12/10/2020 See surgical note for result.  ECHOCARDIOGRAM COMPLETE  Result Date: 12/11/2020    ECHOCARDIOGRAM REPORT   Patient Name:   TERRIE HARING Date of Exam: 12/10/2020 Medical Rec #:  [id number]     Height:       66.0 in Accession #:    [id number]    Weight:       109.6 lb Date of Birth:  18-Mar-1970     BSA:          1.549 m Patient Age:    50 years      BP:           96/77 mmHg Patient Gender: F             HR:           68 bpm. Exam Location:  [facility] Procedure: 2D Echo, Cardiac Doppler, Color Doppler and Intracardiac            Opacification Agent Indications:     P95.09 Acute Diastolic CHF  History:         Patient has no prior history of Echocardiogram examinations.                  Risk Factors:Diabetes and Hypertension.  Sonographer:     Cresenciano Lick RDCS Referring Phys:  [place] Diagnosing Phys: Ida Rogue MD IMPRESSIONS  1. Left ventricular ejection fraction, by estimation, is 40 to 45%. The left ventricle has mildly decreased function. The left ventricle demonstrates regional wall motion abnormalities (Apical and periapical hypokinesis, basal and  mid wall motion best preserved, consistent with possible stress cardiomyopathy though unable to exclude ishcemia). Left ventricular diastolic parameters are consistent with Grade II diastolic dysfunction  (pseudonormalization).  2. Right ventricular systolic function is normal. The right ventricular size is normal. Tricuspid regurgitation signal is inadequate for assessing PA pressure.  3. The mitral valve is normal in structure. No evidence of mitral valve regurgitation. No evidence of mitral stenosis.  4. The aortic valve is normal in structure. Aortic valve regurgitation is not visualized. No aortic stenosis is present.  5. The inferior vena cava is dilated in size with <50% respiratory variability, suggesting right atrial pressure of 15 mmHg. FINDINGS  Left Ventricle: Left ventricular ejection fraction, by estimation, is 40 to 45%. The left ventricle has mildly decreased function. The left ventricle demonstrates regional wall motion abnormalities. Definity contrast agent was given IV to delineate the left ventricular endocardial borders. The left ventricular internal cavity size was normal in size. There is no left ventricular hypertrophy. Left ventricular diastolic parameters are consistent with Grade II diastolic dysfunction (pseudonormalization). Right Ventricle: The right ventricular size is normal. No increase in right ventricular wall thickness. Right ventricular systolic function is normal. Tricuspid regurgitation signal is inadequate for assessing PA pressure. Left Atrium: Left atrial size was normal in size. Right Atrium: Right atrial size was normal in size. Pericardium: There is no evidence of pericardial effusion. Mitral Valve: The mitral valve is normal in structure. No evidence of mitral valve regurgitation. No evidence of mitral valve stenosis. Tricuspid Valve: The tricuspid valve is normal in structure. Tricuspid valve regurgitation is not demonstrated. No evidence of tricuspid stenosis. Aortic Valve: The aortic valve is normal in structure. Aortic valve regurgitation is not visualized. No aortic stenosis is present. Pulmonic Valve: The pulmonic valve was normal in structure. Pulmonic valve  regurgitation is not visualized. No evidence of pulmonic stenosis. Aorta: The aortic root is normal in size and structure. Venous: The inferior vena cava is dilated in size with less than 50% respiratory variability, suggesting right atrial pressure of 15 mmHg. IAS/Shunts: No atrial level shunt detected by color flow Doppler.  LEFT VENTRICLE PLAX 2D LVIDd:         3.70 cm   Diastology LVIDs:         2.20 cm   LV e' medial:    5.55 cm/s LV PW:         0.90 cm   LV E/e' medial:  12.0 LV IVS:        0.90 cm   LV e' lateral:   8.92 cm/s LVOT diam:     2.00 cm   LV E/e' lateral: 7.4 LV SV:         80 LV SV Index:   52 LVOT Area:     3.14 cm  RIGHT VENTRICLE             IVC RV Basal diam:  3.60 cm     IVC diam: 3.00 cm RV S prime:     14.90 cm/s TAPSE (M-mode): 2.1 cm LEFT ATRIUM             Index        RIGHT ATRIUM           Index LA diam:        3.70 cm 2.39 cm/m   RA Area:     12.20 cm LA Vol (A2C):   39.8 ml 25.70 ml/m  RA Volume:   30.90 ml  19.95 ml/m  LA Vol (A4C):   40.6 ml 26.22 ml/m LA Biplane Vol: 40.2 ml 25.96 ml/m  AORTIC VALVE LVOT Vmax:   123.00 cm/s LVOT Vmean:  105.000 cm/s LVOT VTI:    0.256 m  AORTA Ao Root diam: 3.00 cm MITRAL VALVE MV Area (PHT): 3.08 cm    SHUNTS MV Decel Time: 246 msec    Systemic VTI:  0.26 m MV E velocity: 66.40 cm/s  Systemic Diam: 2.00 cm MV A velocity: 57.40 cm/s MV E/A ratio:  1.16 Ida Rogue MD Electronically signed by Ida Rogue MD Signature Date/Time: 12/11/2020/8:09:01 AM    Final       Subjective: He denies any chest pain, shortness of breath today  Discharge Exam: Vitals:   12/16/20 0825 12/16/20 1144  BP: (!) 155/82 (!) 101/56  Pulse: 71 73  Resp: 18 16  Temp: 98.5 F (36.9 C) 98.2 F (36.8 C)  SpO2: 98%    Vitals:   12/16/20 0359 12/16/20 0512 12/16/20 0825 12/16/20 1144  BP: (!) 158/76  (!) 155/82 (!) 101/56  Pulse: 76  71 73  Resp: 16  18 16   Temp: 98.2 F (36.8 C)  98.5 F (36.9 C) 98.2 F (36.8 C)  TempSrc:   Oral Oral   SpO2: 98%  98%   Weight:  54.1 kg    Height:        General: Pt is alert, awake, not in acute distress Cardiovascular: RRR, S1/S2 +, no rubs, no gallops Respiratory: CTA bilaterally, no wheezing, no rhonchi Abdominal: Soft, NT, ND, bowel sounds + Extremities: no edema, no cyanosis, left lower extremity edema and erythema significantly improved    The results of significant diagnostics from this hospitalization (including imaging, microbiology, ancillary and laboratory) are listed below for reference.     Microbiology: Recent Results (from the past 240 hour(s))  Resp Panel by RT-PCR (Flu A&B, Covid) Nasopharyngeal Swab     Status: None   Collection Time: 12/10/20  8:20 AM   Specimen: Nasopharyngeal Swab; Nasopharyngeal(NP) swabs in vial transport medium  Result Value Ref Range Status   SARS Coronavirus 2 by RT PCR NEGATIVE NEGATIVE Final    Comment: (NOTE) SARS-CoV-2 target nucleic acids are NOT DETECTED.  The SARS-CoV-2 RNA is generally detectable in upper respiratory specimens during the acute phase of infection. The lowest concentration of SARS-CoV-2 viral copies this assay can detect is 138 copies/mL. A negative result does not preclude SARS-Cov-2 infection and should not be used as the sole basis for treatment or other patient management decisions. A negative result may occur with  improper specimen collection/handling, submission of specimen other than nasopharyngeal swab, presence of viral mutation(s) within the areas targeted by this assay, and inadequate number of viral copies(<138 copies/mL). A negative result must be combined with clinical observations, patient history, and epidemiological information. The expected result is Negative.  Fact Sheet for Patients:  EntrepreneurPulse.com.au  Fact Sheet for Healthcare Providers:  IncredibleEmployment.be  This test is no t yet approved or cleared by the [place] FDA and  has  been authorized for detection and/or diagnosis of SARS-CoV-2 by FDA under an Emergency Use Authorization (EUA). This EUA will remain  in effect (meaning this test can be used) for the duration of the COVID-19 declaration under Section 564(b)(1) of the Act, 21 U.S.C.section 360bbb-3(b)(1), unless the authorization is terminated  or revoked sooner.       Influenza A by PCR NEGATIVE NEGATIVE Final   Influenza B by PCR NEGATIVE NEGATIVE Final  Comment: (NOTE) The Xpert Xpress SARS-CoV-2/FLU/RSV plus assay is intended as an aid in the diagnosis of influenza from Nasopharyngeal swab specimens and should not be used as a sole basis for treatment. Nasal washings and aspirates are unacceptable for Xpert Xpress SARS-CoV-2/FLU/RSV testing.  Fact Sheet for Patients: EntrepreneurPulse.com.au  Fact Sheet for Healthcare Providers: IncredibleEmployment.be  This test is not yet approved or cleared by the [place] FDA and has been authorized for detection and/or diagnosis of SARS-CoV-2 by FDA under an Emergency Use Authorization (EUA). This EUA will remain in effect (meaning this test can be used) for the duration of the COVID-19 declaration under Section 564(b)(1) of the Act, 21 U.S.C. section 360bbb-3(b)(1), unless the authorization is terminated or revoked.  Performed at [facility], [address] [place], [place] [address]      Labs: BNP (last 3 results) Recent Labs    12/09/20 1815  BNP 202.5*   Basic Metabolic Panel: Recent Labs  Lab 12/10/20 0509 12/10/20 1840 12/11/20 0138 12/12/20 0532 12/13/20 0352 12/14/20 0558 12/16/20 0354  NA  --    < > 133* 138 134* 135 136  K  --    < > 3.1* 4.0 3.7 4.2 4.2  CL  --    < > 101 103 99 101 103  CO2  --    < > 27 29 30 30 29   GLUCOSE  --    < > 477* 183* 238* 217* 290*  BUN  --    < > 17 12 10 7 9   CREATININE  --    < > 0.60 0.31* 0.40* <0.30* 0.43*  CALCIUM  --    < > 8.2*  8.6* 8.5* 8.5* 8.5*  MG 2.0  --   --   --   --   --   --    < > = values in this interval not displayed.   Liver Function Tests: Recent Labs  Lab 12/09/20 1501  AST 15  ALT 12  ALKPHOS 59  BILITOT 2.2*  PROT 7.4  ALBUMIN 3.9   No results for input(s): LIPASE, AMYLASE in the last 168 hours. No results for input(s): AMMONIA in the last 168 hours. CBC: Recent Labs  Lab 12/12/20 0532 12/13/20 0352 12/14/20 0558 12/15/20 0541 12/16/20 0354  WBC 4.8 4.7 3.6* 3.8* 3.6*  HGB 11.1* 9.9* 10.1* 9.9* 10.0*  HCT 33.6* 30.5* 30.6* 30.6* 30.5*  MCV 88.9 88.7 89.7 90.0 88.4  PLT 131* 125* 137* 155 184   Cardiac Enzymes: No results for input(s): CKTOTAL, CKMB, CKMBINDEX, TROPONINI in the last 168 hours. BNP: Invalid input(s): POCBNP CBG: Recent Labs  Lab 12/15/20 0802 12/15/20 1118 12/15/20 2135 12/16/20 0827 12/16/20 1154  GLUCAP 165* 150* 299* 278* 238*   D-Dimer No results for input(s): DDIMER in the last 72 hours. Hgb A1c No results for input(s): HGBA1C in the last 72 hours. Lipid Profile No results for input(s): CHOL, HDL, LDLCALC, TRIG, CHOLHDL, LDLDIRECT in the last 72 hours. Thyroid function studies No results for input(s): TSH, T4TOTAL, T3FREE, THYROIDAB in the last 72 hours.  Invalid input(s): FREET3 Anemia work up No results for input(s): VITAMINB12, FOLATE, FERRITIN, TIBC, IRON, RETICCTPCT in the last 72 hours. Urinalysis    Component Value Date/Time   COLORURINE Straw 08/30/2012 0047   APPEARANCEUR Clear 08/30/2012 0047   LABSPEC 1.014 08/30/2012 0047   PHURINE 6.0 08/30/2012 0047   GLUCOSEU >=500 08/30/2012 0047   HGBUR Negative 08/30/2012 0047   BILIRUBINUR Negative 08/30/2012 0047  KETONESUR Negative 08/30/2012 0047   PROTEINUR Negative 08/30/2012 0047   NITRITE Negative 08/30/2012 0047   LEUKOCYTESUR 1+ 08/30/2012 0047   Sepsis Labs Invalid input(s): PROCALCITONIN,  WBC,  LACTICIDVEN Microbiology Recent Results (from the past 240 hour(s))   Resp Panel by RT-PCR (Flu A&B, Covid) Nasopharyngeal Swab     Status: None   Collection Time: 12/10/20  8:20 AM   Specimen: Nasopharyngeal Swab; Nasopharyngeal(NP) swabs in vial transport medium  Result Value Ref Range Status   SARS Coronavirus 2 by RT PCR NEGATIVE NEGATIVE Final    Comment: (NOTE) SARS-CoV-2 target nucleic acids are NOT DETECTED.  The SARS-CoV-2 RNA is generally detectable in upper respiratory specimens during the acute phase of infection. The lowest concentration of SARS-CoV-2 viral copies this assay can detect is 138 copies/mL. A negative result does not preclude SARS-Cov-2 infection and should not be used as the sole basis for treatment or other patient management decisions. A negative result may occur with  improper specimen collection/handling, submission of specimen other than nasopharyngeal swab, presence of viral mutation(s) within the areas targeted by this assay, and inadequate number of viral copies(<138 copies/mL). A negative result must be combined with clinical observations, patient history, and epidemiological information. The expected result is Negative.  Fact Sheet for Patients:  EntrepreneurPulse.com.au  Fact Sheet for Healthcare Providers:  IncredibleEmployment.be  This test is no t yet approved or cleared by the [place] FDA and  has been authorized for detection and/or diagnosis of SARS-CoV-2 by FDA under an Emergency Use Authorization (EUA). This EUA will remain  in effect (meaning this test can be used) for the duration of the COVID-19 declaration under Section 564(b)(1) of the Act, 21 U.S.C.section 360bbb-3(b)(1), unless the authorization is terminated  or revoked sooner.       Influenza A by PCR NEGATIVE NEGATIVE Final   Influenza B by PCR NEGATIVE NEGATIVE Final    Comment: (NOTE) The Xpert Xpress SARS-CoV-2/FLU/RSV plus assay is intended as an aid in the diagnosis of influenza from  Nasopharyngeal swab specimens and should not be used as a sole basis for treatment. Nasal washings and aspirates are unacceptable for Xpert Xpress SARS-CoV-2/FLU/RSV testing.  Fact Sheet for Patients: EntrepreneurPulse.com.au  Fact Sheet for Healthcare Providers: IncredibleEmployment.be  This test is not yet approved or cleared by the [place] FDA and has been authorized for detection and/or diagnosis of SARS-CoV-2 by FDA under an Emergency Use Authorization (EUA). This EUA will remain in effect (meaning this test can be used) for the duration of the COVID-19 declaration under Section 564(b)(1) of the Act, 21 U.S.C. section 360bbb-3(b)(1), unless the authorization is terminated or revoked.  Performed at [facility], [address], [place], [place] [address]      Time coordinating discharge: Over 30 minutes  SIGNED:   Phillips Climes, MD  [facility] 12/16/2020, 1:04 PM Pager   If 7PM-7AM, please contact night-coverage [email or web address] Password [email or web address]

## 2020-12-16 NOTE — TOC Initial Note (Signed)
Transition of Care [facility]) - Initial/Assessment Note    Patient Details  Name: Beth Carroll MRN: [id number] Date of Birth: July 21, 1970  Transition of Care [facility]) CM/SW Contact:    Eileen Stanford, LCSW Phone Number: 12/16/2020, 3:09 PM  Clinical Narrative:   CSW spoke with pt and pt states she lives with her friend/significant other. CSW explained the PT is recommending HH however with pt's insurance it is highly unlikely that pt will have a agency that is willing to service her. Pt is understanding and ia agreeable to outpatient PT. Pt lives in [place] and would like [facility]. CSW has sent referral. Pt is also agreeable to a 3n1 and walker--ordered through [employer].                Expected Discharge Plan: Home/Self Care Barriers to Discharge: No Barriers Identified   Patient Goals and CMS Choice Patient states their goals for this hospitalization and ongoing recovery are:: to go home   Choice offered to / list presented to : Patient  Expected Discharge Plan and Services Expected Discharge Plan: Home/Self Care In-house Referral: Clinical Social Work   Post Acute Care Choice: NA Living arrangements for the past 2 months: [place] Expected Discharge Date: 12/16/20               DME Arranged: Berta Minor DME Agency: [employer]                  Prior Living Arrangements/Services Living arrangements for the past 2 months: Single Family Home Lives with:: Significant Other Patient language and need for interpreter reviewed:: Yes Do you feel safe going back to the place where you live?: Yes      Need for Family Participation in Patient Care: Yes (Comment) Care giver support system in place?: Yes (comment)   Criminal Activity/Legal Involvement Pertinent to Current Situation/Hospitalization: No - Comment as needed  Activities of Daily Living Home Assistive Devices/Equipment: None ADL Screening (condition at time of admission) Patient's cognitive ability adequate to  safely complete daily activities?: Yes Is the patient deaf or have difficulty hearing?: No Does the patient have difficulty seeing, even when wearing glasses/contacts?: No Does the patient have difficulty concentrating, remembering, or making decisions?: No Patient able to express need for assistance with ADLs?: Yes Does the patient have difficulty dressing or bathing?: No Independently performs ADLs?: Yes (appropriate for developmental age) Does the patient have difficulty walking or climbing stairs?: No Weakness of Legs: Both Weakness of Arms/Hands: None  Permission Sought/Granted Permission sought to share information with : Family Supports Permission granted to share information with : Yes, Verbal Permission Granted  Share Information with NAME: Joe     Permission granted to share info w Relationship: friend     Emotional Assessment Appearance:: Appears stated age Attitude/Demeanor/Rapport: Engaged Affect (typically observed): Accepting Orientation: : Oriented to Self, Oriented to Place, Oriented to  Time, Oriented to Situation Alcohol / Substance Use: Not Applicable Psych Involvement: No (comment)  Admission diagnosis:  Lower limb ischemia [I99.8] Left leg swelling [M79.89] Postural dizziness with presyncope [R42, R55] Patient Active Problem List   Diagnosis Date Noted   Acute systolic heart failure (HCC)    Diabetes mellitus without complication (HCC)    Hypertension    Rest pain of both lower extremities due to atherosclerosis (HCC)    Postural dizziness with presyncope    Hyperglycemia due to type 2 diabetes mellitus (HCC)    Hypokalemia    Bilateral lower extremity edema  PCP:  Baxter Hire, MD Pharmacy:  No Pharmacies Listed    Social Determinants of Health (SDOH) Interventions    Readmission Risk Interventions No flowsheet data found.

## 2020-12-16 NOTE — Progress Notes (Signed)
Progress Note  Patient Name: Beth Carroll Date of Encounter: 12/16/2020  [facility] Cardiologist: Rockey Situ  Subjective   Pt is s/p DES x2. She denies chest pain and SOB. Has not been up and about the room yet. Cath site, right radial, looks stable.   Inpatient Medications    Scheduled Meds:  aspirin EC  81 mg Oral Daily   atorvastatin  20 mg Oral Daily   carvedilol  3.125 mg Oral BID WC   clopidogrel  75 mg Oral Daily   enoxaparin (LOVENOX) injection  40 mg Subcutaneous Q24H   insulin aspart  0-15 Units Subcutaneous TID WC   insulin aspart  0-5 Units Subcutaneous QHS   insulin aspart  3 Units Subcutaneous TID WC   insulin glargine-yfgn  25 Units Subcutaneous Daily   pantoprazole  40 mg Oral Daily   sodium chloride flush  3 mL Intravenous Q12H   Continuous Infusions:  sodium chloride     PRN Meds: sodium chloride, acetaminophen **OR** acetaminophen, hydrALAZINE, HYDROcodone-acetaminophen, morphine injection, ondansetron **OR** ondansetron (ZOFRAN) IV, sodium chloride flush   Vital Signs    Vitals:   12/16/20 0042 12/16/20 0359 12/16/20 0512 12/16/20 0825  BP: 135/74 (!) 158/76  (!) 155/82  Pulse: 78 76  71  Resp: 16 16  18   Temp: 98.4 F (36.9 C) 98.2 F (36.8 C)  98.5 F (36.9 C)  TempSrc:    Oral  SpO2: 100% 98%  98%  Weight:   54.1 kg   Height:        Intake/Output Summary (Last 24 hours) at 12/16/2020 0853 Last data filed at 12/16/2020 0800 Gross per 24 hour  Intake 577.68 ml  Output 1500 ml  Net -922.32 ml   Last 3 Weights 12/16/2020 12/15/2020 12/15/2020  Weight (lbs) 119 lb 4.3 oz 108 lb 7.5 oz 108 lb 7.5 oz  Weight (kg) 54.1 kg 49.2 kg 49.2 kg      Telemetry    NSR, HR 70s - Personally Reviewed  ECG    NSR, 75bpm, LAD, tWI aVL - Personally Reviewed  Physical Exam   GEN: No acute distress.   Neck: No JVD Cardiac: RRR, no murmurs, rubs, or gallops.  Respiratory: Clear to auscultation bilaterally. GI: Soft, nontender, non-distended   MS: No edema; No deformity. Neuro:  Nonfocal  Psych: Normal affect   Labs    High Sensitivity Troponin:   Recent Labs  Lab 12/09/20 1501 12/09/20 1815  TROPONINIHS 28* 27*     Chemistry Recent Labs  Lab 12/09/20 1501 12/09/20 2056 12/10/20 0509 12/10/20 1840 12/13/20 0352 12/14/20 0558 12/16/20 0354  NA 132*   < >  --    < > 134* 135 136  K 2.4*   < >  --    < > 3.7 4.2 4.2  CL 87*   < >  --    < > 99 101 103  CO2 23   < >  --    < > 30 30 29   GLUCOSE 392*   < >  --    < > 238* 217* 290*  BUN 25*   < >  --    < > 10 7 9   CREATININE 0.92   < >  --    < > 0.40* <0.30* 0.43*  CALCIUM 9.2   < >  --    < > 8.5* 8.5* 8.5*  MG  --   --  2.0  --   --   --   --  PROT 7.4  --   --   --   --   --   --   ALBUMIN 3.9  --   --   --   --   --   --   AST 15  --   --   --   --   --   --   ALT 12  --   --   --   --   --   --   ALKPHOS 59  --   --   --   --   --   --   BILITOT 2.2*  --   --   --   --   --   --   GFRNONAA >60   < >  --    < > >60 NOT CALCULATED >60  ANIONGAP 22*   < >  --    < > 5 4* 4*   < > = values in this interval not displayed.    Lipids  Recent Labs  Lab 12/12/20 0532  CHOL 120  TRIG 59  HDL 45  LDLCALC 63  CHOLHDL 2.7    Hematology Recent Labs  Lab 12/14/20 0558 12/15/20 0541 12/16/20 0354  WBC 3.6* 3.8* 3.6*  RBC 3.41* 3.40* 3.45*  HGB 10.1* 9.9* 10.0*  HCT 30.6* 30.6* 30.5*  MCV 89.7 90.0 88.4  MCH 29.6 29.1 29.0  MCHC 33.0 32.4 32.8  RDW 12.9 12.9 12.7  PLT 137* 155 184   Thyroid No results for input(s): TSH, FREET4 in the last 168 hours.  BNP Recent Labs  Lab 12/09/20 1815  BNP 106.3*    DDimer No results for input(s): DDIMER in the last 168 hours.   Radiology    CARDIAC CATHETERIZATION  Result Date: 12/15/2020   Mid LAD lesion is 99% stenosed.   Ramus lesion is 90% stenosed.   Mid LAD to Dist LAD lesion is 40% stenosed.   A drug-eluting stent was successfully placed using a STENT ONYX FRONTIER 2.25X18.   A drug-eluting  stent was successfully placed using a STENT ONYX FRONTIER 2.75X22.   Post intervention, there is a 0% residual stenosis.   Post intervention, there is a 0% residual stenosis.   The left ventricular systolic function is normal.   LV end diastolic pressure is normal.   The left ventricular ejection fraction is 35-45% by visual estimate. 1.  Severe two-vessel coronary artery disease with subtotal occlusion of the mid LAD as well as significant stenosis in proximal ramus.  The LAD is diffusely diseased and small in size in the mid to distal segment. 2.  Moderately reduced LV systolic function with akinesis of mid to distal anterior and apical myocardium.  Normal left ventricular end-diastolic pressure. 3.  Successful angioplasty and drug-eluting stent placement to the mid LAD as well as proximal ramus. Recommendations: Dual antiplatelet therapy for at least 6 months. Aggressive treatment of risk factors.    Cardiac Studies   12/15/20 CORONARY STENT INTERVENTION  LEFT HEART CATH AND CORONARY ANGIOGRAPHY   Conclusion      Mid LAD lesion is 99% stenosed.   Ramus lesion is 90% stenosed.   Mid LAD to Dist LAD lesion is 40% stenosed.   A drug-eluting stent was successfully placed using a STENT ONYX FRONTIER 2.25X18.   A drug-eluting stent was successfully placed using a STENT ONYX FRONTIER 2.75X22.   Post intervention, there is a 0% residual stenosis.   Post intervention, there is a 0% residual  stenosis.   The left ventricular systolic function is normal.   LV end diastolic pressure is normal.   The left ventricular ejection fraction is 35-45% by visual estimate.   1.  Severe two-vessel coronary artery disease with subtotal occlusion of the mid LAD as well as significant stenosis in proximal ramus.  The LAD is diffusely diseased and small in size in the mid to distal segment. 2.  Moderately reduced LV systolic function with akinesis of mid to distal anterior and apical myocardium.  Normal left  ventricular end-diastolic pressure. 3.  Successful angioplasty and drug-eluting stent placement to the mid LAD as well as proximal ramus.   Recommendations: Dual antiplatelet therapy for at least 6 months. Aggressive treatment of risk factors.  Coronary Diagrams  Diagnostic Dominance: Right Intervention     Echo 12/10/20  1. Left ventricular ejection fraction, by estimation, is 40 to 45%. The  left ventricle has mildly decreased function. The left ventricle  demonstrates regional wall motion abnormalities (Apical and periapical  hypokinesis, basal and mid wall motion best  preserved, consistent with possible stress cardiomyopathy though unable to  exclude ishcemia). Left ventricular diastolic parameters are consistent  with Grade II diastolic dysfunction (pseudonormalization).   2. Right ventricular systolic function is normal. The right ventricular  size is normal. Tricuspid regurgitation signal is inadequate for assessing  PA pressure.   3. The mitral valve is normal in structure. No evidence of mitral valve  regurgitation. No evidence of mitral stenosis.   4. The aortic valve is normal in structure. Aortic valve regurgitation is  not visualized. No aortic stenosis is present.   5. The inferior vena cava is dilated in size with <50% respiratory  variability, suggesting right atrial pressure of 15 mmHg.   Patient Profile     50 y.o. female  history of DM2, HTN, and lower extremity swelling and ongoing tobacco use who is being seen today for the evaluation of cardiomyopathy  Assessment & Plan    CAD s/p PCI to LAD and Ramus - s/p cath for CM which showed subtotal occlusion of the mLAD which was treated with DES to proximal ramus and LAD - Continue DAPT with ASA and Plavix for at least 6 month - continue Lipitor 20mg  daily and Coreg 3.125mg BID - cath site, right radial, stable - ambulate patient  and monitor symptoms - will need referral for cardiac rehab as OP - send in  SL NTG at d/c  ICM Acute systolic HFrEF - Echo showed LVEF 40-45%, G2DD and patient underwent cath as above s/p stenting - Patient is euvolemic on exam - continue Coreg, add on GDMT as able - will need repeat echo in 2-3 months to reassess pump function  HTN - Bps mildly elevated - continue Coreg - can add on Entresto  HLD - LDL 63 - continue statin  DM2 - A1C 13.7 - SSI per IM  PAD/critical limb ischemia - s/p intervention left lower extremity December 10, 2020 - the patient had significant disease affecting the right lower extremity, although is not very symptomatic - continue ASA, Plavix, statin/Lipitor  For questions or updates, please contact [facility] Please consult [email or web address] for contact info under        Signed, Jasun Gasparini Ninfa Meeker, PA-C  12/16/2020, 8:53 AM

## 2020-12-16 NOTE — Evaluation (Signed)
Physical Therapy Evaluation Patient Details Name: Beth Carroll MRN: [id number] DOB: [birth year] Today's Date: 12/16/2020  History of Present Illness  Beth Carroll is a 50yoF [facility] for postural dizziness with presyncope and c/o BLE edema over the past 3 months. Negative doppler on 10/26 with little response to lasix, compression, and elevation; recently completed course of doxycycline for possible cellulitis. Increased BLE pain during ambulation that improves with rest. S/p angiogram on 11/16 of LLE with plans for angiogram of RLE. Significant PMH includes: DM, HTN, PAD, chronic smoker.Pt underwent cardiac cath on 11/21 (Rt radial access),  Clinical Impression  Pt admitted with above diagnosis. Pt currently with functional limitations due to the deficits listed below (see "PT Problem List"). Upon entry, pt in bed, awake and agreeable to participate. The pt is alert, pleasant, interactive, and able to provide info regarding prior level of function, both in tolerance and independence. Pt educated on details of RUE limb restriction post cardiac cath, clarified with cardiology PA Kathlen Mody; permitted to use RW c RUE 1300 12/17/20 (48hours post cath). Pt AMB 158ft in room with LUE bearing HHA. Pain in left foot remains biggest limitation to AMB tolerance. Patient's performance this date reveals decreased ability, independence, and tolerance in performing all basic mobility required for performance of activities of daily living. Pt requires additional DME, close physical assistance, and cues for safe participate in mobility. Pt will benefit from skilled PT intervention to increase independence and safety with basic mobility in preparation for discharge to the venue listed below.       Recommendations for follow up therapy are one component of a multi-disciplinary discharge planning process, led by the attending physician.  Recommendations may be updated based on patient status, additional  functional criteria and insurance authorization.  Follow Up Recommendations Home health PT    Assistance Recommended at Discharge Set up Supervision/Assistance  Functional Status Assessment Patient has had a recent decline in their functional status and demonstrates the ability to make significant improvements in function in a reasonable and predictable amount of time.  Equipment Recommendations  Rolling walker (2 wheels);BSC/3in1    Recommendations for Other Services       Precautions / Restrictions Precautions Precautions: Fall Restrictions Weight Bearing Restrictions: Yes RUE Weight Bearing: Non weight bearing (per standard precautions 48hrs NWB post radial access cardiac cath)      Mobility  Bed Mobility Overal bed mobility: Modified Independent (HOB elevated)                  Transfers Overall transfer level: Needs assistance Equipment used: None Transfers: Sit to/from Stand Sit to Stand: Min guard           General transfer comment: LUE push off to rise, then LUE support for balance in standing.    Ambulation/Gait Ambulation/Gait assistance: Min assist Gait Distance (Feet): 100 Feet Assistive device: 1 person hand held assist Gait Pattern/deviations: Antalgic       General Gait Details: minA push into author's hand LUE  Stairs            Wheelchair Mobility    Modified Rankin (Stroke Patients Only)       Balance                                             Pertinent Vitals/Pain Pain Assessment: 0-10 Pain Score: 7  Pain  Location: left foot Pain Intervention(s): Limited activity within patient's tolerance;Monitored during session;Premedicated before session    [place] expects to be discharged to:: Private residence Living Arrangements: Spouse/significant other Available Help at Discharge: Available PRN/intermittently Type of Home: Mobile home Home Access: Stairs to enter Entrance  Stairs-Rails: Can reach both Entrance Stairs-Number of Steps: 3   Home Layout: One level Home Equipment: None      Prior Function Prior Level of Function : Independent/Modified Independent;Working/employed;Driving             Mobility Comments: MOD I-I ADLs Comments: pt reports independent in ADL/IADL PTA; works in a [employer]   Dominant Hand: Right    Extremity/Trunk Assessment                Communication   Communication: No difficulties  Cognition                                                General Comments      Exercises     Assessment/Plan    PT Assessment Patient needs continued PT services  PT Problem List Decreased strength;Decreased range of motion;Decreased activity tolerance;Decreased balance;Decreased mobility;Pain       PT Treatment Interventions Gait training;DME instruction;Stair training;Functional mobility training;Therapeutic activities;Therapeutic exercise;Balance training;Patient/family education    PT Goals (Current goals can be found in the Care Plan section)  Acute Rehab PT Goals Patient Stated Goal: "go home" PT Goal Formulation: With patient Time For Goal Achievement: 12/30/20 Potential to Achieve Goals: Good    Frequency Min 2X/week   Barriers to discharge Decreased caregiver support      Co-evaluation               AM-PAC PT "6 Clicks" Mobility  Outcome Measure Help needed turning from your back to your side while in a flat bed without using bedrails?: None Help needed moving from lying on your back to sitting on the side of a flat bed without using bedrails?: None Help needed moving to and from a bed to a chair (including a wheelchair)?: A Lot Help needed standing up from a chair using your arms (e.g., wheelchair or bedside chair)?: A Lot Help needed to walk in hospital room?: A Lot Help needed climbing 3-5 steps with a railing? : A Lot 6 Click Score: 16    End of  Session Equipment Utilized During Treatment: Gait belt Activity Tolerance: Patient tolerated treatment well;Patient limited by pain Patient left: in chair;with call bell/phone within reach;with chair alarm set;with nursing/sitter in room Nurse Communication: Mobility status PT Visit Diagnosis: Unsteadiness on feet (R26.81);Muscle weakness (generalized) (M62.81);Pain Pain - Right/Left: Left Pain - part of body: Ankle and joints of foot    Time: 1321-1346 PT Time Calculation (min) (ACUTE ONLY): 25 min   Charges:   PT Evaluation $PT Re-evaluation: 1 Re-eval PT Treatments $Gait Training: 8-22 mins       1:57 PM, 12/16/20 Etta Grandchild, PT, DPT Physical Therapist - [facility]  [phone] ([place])    [place] C 12/16/2020, 1:55 PM

## 2020-12-22 DIAGNOSIS — I739 Peripheral vascular disease, unspecified: Secondary | ICD-10-CM | POA: Insufficient documentation

## 2020-12-22 DIAGNOSIS — I5022 Chronic systolic (congestive) heart failure: Secondary | ICD-10-CM | POA: Insufficient documentation

## 2020-12-24 ENCOUNTER — Other Ambulatory Visit (INDEPENDENT_AMBULATORY_CARE_PROVIDER_SITE_OTHER): Payer: Self-pay | Admitting: Nurse Practitioner

## 2020-12-24 DIAGNOSIS — I70223 Atherosclerosis of native arteries of extremities with rest pain, bilateral legs: Secondary | ICD-10-CM

## 2020-12-25 ENCOUNTER — Other Ambulatory Visit: Payer: Self-pay

## 2020-12-25 ENCOUNTER — Encounter
Admission: RE | Disposition: A | Discharge status: Home / Self Care | Payer: Self-pay | Source: Ambulatory Visit | Attending: Vascular Surgery

## 2020-12-25 ENCOUNTER — Ambulatory Visit
Admission: RE | Admit: 2020-12-25 | Discharge: 2020-12-25 | Disposition: A | Discharge status: Home / Self Care | Payer: No Typology Code available for payment source | Source: Ambulatory Visit | Attending: Vascular Surgery | Admitting: Vascular Surgery

## 2020-12-25 ENCOUNTER — Encounter: Payer: Self-pay | Admitting: Vascular Surgery

## 2020-12-25 DIAGNOSIS — I70221 Atherosclerosis of native arteries of extremities with rest pain, right leg: Secondary | ICD-10-CM | POA: Diagnosis not present

## 2020-12-25 DIAGNOSIS — I10 Essential (primary) hypertension: Secondary | ICD-10-CM | POA: Insufficient documentation

## 2020-12-25 DIAGNOSIS — E1151 Type 2 diabetes mellitus with diabetic peripheral angiopathy without gangrene: Secondary | ICD-10-CM | POA: Insufficient documentation

## 2020-12-25 DIAGNOSIS — I70219 Atherosclerosis of native arteries of extremities with intermittent claudication, unspecified extremity: Secondary | ICD-10-CM

## 2020-12-25 DIAGNOSIS — R600 Localized edema: Secondary | ICD-10-CM | POA: Diagnosis not present

## 2020-12-25 DIAGNOSIS — I70223 Atherosclerosis of native arteries of extremities with rest pain, bilateral legs: Secondary | ICD-10-CM

## 2020-12-25 HISTORY — PX: LOWER EXTREMITY ANGIOGRAPHY: CATH118251

## 2020-12-25 LAB — CREATININE, SERUM: Creatinine, Ser: <0.3 mg/dL — ABNORMAL LOW (ref 0.44–1.00)

## 2020-12-25 LAB — BUN: BUN: 14 mg/dL (ref 6–20)

## 2020-12-25 LAB — GLUCOSE, CAPILLARY
Glucose-Capillary: 282 mg/dL — ABNORMAL HIGH (ref 70–99)
Glucose-Capillary: 313 mg/dL — ABNORMAL HIGH (ref 70–99)

## 2020-12-25 SURGERY — LOWER EXTREMITY ANGIOGRAPHY
Anesthesia: Moderate Sedation | Site: Leg Lower | Laterality: Right

## 2020-12-25 MED ORDER — METHYLPREDNISOLONE SODIUM SUCC 125 MG IJ SOLR: 125.0000 mg | Freq: Once | INTRAMUSCULAR | Status: DC | PRN

## 2020-12-25 MED ORDER — FENTANYL CITRATE (PF) 100 MCG/2ML IJ SOLN
INTRAMUSCULAR | Status: DC | PRN
  Administered 2020-12-25 (×2): 50 ug via INTRAVENOUS

## 2020-12-25 MED ORDER — FENTANYL CITRATE PF 50 MCG/ML IJ SOSY
PREFILLED_SYRINGE | INTRAMUSCULAR | Status: AC
  Filled 2020-12-25: qty 1

## 2020-12-25 MED ORDER — CEFAZOLIN SODIUM-DEXTROSE 2-4 GM/100ML-% IV SOLN
2.0000 g | Freq: Once | INTRAVENOUS | Status: AC
  Administered 2020-12-25: 2 g via INTRAVENOUS

## 2020-12-25 MED ORDER — ONDANSETRON HCL 4 MG/2ML IJ SOLN: 4.0000 mg | Freq: Four times a day (QID) | INTRAMUSCULAR | Status: DC | PRN

## 2020-12-25 MED ORDER — HYDRALAZINE HCL 20 MG/ML IJ SOLN
INTRAMUSCULAR | Status: DC | PRN
  Administered 2020-12-25: 20 mg via INTRAVENOUS

## 2020-12-25 MED ORDER — HYDROMORPHONE HCL 1 MG/ML IJ SOLN: 1.0000 mg | Freq: Once | INTRAMUSCULAR | Status: DC | PRN

## 2020-12-25 MED ORDER — SODIUM CHLORIDE 0.9 % IV SOLN
25.0000 mg | Freq: Once | INTRAVENOUS | Status: AC
  Administered 2020-12-25: 25 mg via INTRAVENOUS
  Filled 2020-12-25: qty 1

## 2020-12-25 MED ORDER — MIDAZOLAM HCL 2 MG/2ML IJ SOLN
INTRAMUSCULAR | Status: DC | PRN
  Administered 2020-12-25: 1 mg via INTRAVENOUS
  Administered 2020-12-25: 2 mg via INTRAVENOUS

## 2020-12-25 MED ORDER — MIDAZOLAM HCL 2 MG/2ML IJ SOLN
INTRAMUSCULAR | Status: AC
  Filled 2020-12-25: qty 4

## 2020-12-25 MED ORDER — FENTANYL CITRATE PF 50 MCG/ML IJ SOSY
PREFILLED_SYRINGE | INTRAMUSCULAR | Status: AC
  Filled 2020-12-25: qty 2

## 2020-12-25 MED ORDER — HYDRALAZINE HCL 20 MG/ML IJ SOLN
INTRAMUSCULAR | Status: AC
  Filled 2020-12-25: qty 1

## 2020-12-25 MED ORDER — MIDAZOLAM HCL 2 MG/ML PO SYRP: 8.0000 mg | ORAL_SOLUTION | Freq: Once | ORAL | Status: DC | PRN

## 2020-12-25 MED ORDER — HEPARIN SODIUM (PORCINE) 1000 UNIT/ML IJ SOLN
INTRAMUSCULAR | Status: DC | PRN
  Administered 2020-12-25: 4000 [IU] via INTRAVENOUS

## 2020-12-25 MED ORDER — FAMOTIDINE 20 MG PO TABS: 40.0000 mg | ORAL_TABLET | Freq: Once | ORAL | Status: DC | PRN

## 2020-12-25 MED ORDER — DIPHENHYDRAMINE HCL 50 MG/ML IJ SOLN: 50.0000 mg | Freq: Once | INTRAMUSCULAR | Status: DC | PRN

## 2020-12-25 MED ORDER — SODIUM CHLORIDE 0.9 % IV SOLN: INTRAVENOUS | Status: DC

## 2020-12-25 MED ORDER — HEPARIN SODIUM (PORCINE) 1000 UNIT/ML IJ SOLN
INTRAMUSCULAR | Status: AC
  Filled 2020-12-25: qty 10

## 2020-12-25 MED ORDER — ONDANSETRON HCL 4 MG/2ML IJ SOLN
INTRAMUSCULAR | Status: AC
  Administered 2020-12-25: 4 mg via INTRAVENOUS
  Filled 2020-12-25: qty 2

## 2020-12-25 SURGICAL SUPPLY — 19 items
BALLN LUTONIX 018 5X150X130 (BALLOONS) ×2
BALLN LUTONIX 018 5X300X130 (BALLOONS) ×4
BALLOON LUTONIX 018 5X150X130 (BALLOONS) ×1 IMPLANT
BALLOON LUTONIX 018 5X300X130 (BALLOONS) ×2 IMPLANT
CATH BEACON 5 .038 100 VERT TP (CATHETERS) ×2 IMPLANT
CATH NAVICROSS ANGLED 90CM (MICROCATHETER) ×2 IMPLANT
CATH ROTAREX 135 6FR (CATHETERS) ×2 IMPLANT
CATH TEMPO 5F RIM 65CM (CATHETERS) ×2 IMPLANT
COVER PROBE U/S 5X48 (MISCELLANEOUS) ×2 IMPLANT
DEVICE STARCLOSE SE CLOSURE (Vascular Products) ×2 IMPLANT
GLIDEWIRE ADV .035X260CM (WIRE) ×2 IMPLANT
KIT ENCORE 26 ADVANTAGE (KITS) ×2 IMPLANT
PACK ANGIOGRAPHY (CUSTOM PROCEDURE TRAY) ×2 IMPLANT
SHEATH ANL2 6FRX45 HC (SHEATH) ×2 IMPLANT
SHEATH BRITE TIP 5FRX11 (SHEATH) ×2 IMPLANT
STENT VIABAHN 6X100X120 (Permanent Stent) ×2 IMPLANT
STENT VIABAHN 6X250X120 (Permanent Stent) ×2 IMPLANT
WIRE G V18X300CM (WIRE) ×2 IMPLANT
WIRE GUIDERIGHT .035X150 (WIRE) ×2 IMPLANT

## 2020-12-25 NOTE — Interval H&P Note (Signed)
History and Physical Interval Note:  12/25/2020 9:39 AM  Beth Carroll  has presented today for surgery, with the diagnosis of RLE Angio   BARD   ASO w claudication.  The various methods of treatment have been discussed with the patient and family. After consideration of risks, benefits and other options for treatment, the patient has consented to  Procedure(s): LOWER EXTREMITY ANGIOGRAPHY (Right) as a surgical intervention.  The patient's history has been reviewed, patient examined, no change in status, stable for surgery.  I have reviewed the patient's chart and labs.  Questions were answered to the patient's satisfaction.     Leotis Pain

## 2020-12-25 NOTE — Op Note (Signed)
[facility]  Percutaneous Study/Intervention Procedural Note   Date of Surgery: 12/25/2020  Surgeon(s):DEW,JASON    Assistants:none  Pre-operative Diagnosis: PAD with claudication and early rest pain right lower extremity  Post-operative diagnosis:  Same  Procedure(s) Performed:             1.  Ultrasound guidance for vascular access left femoral artery             2.  Catheter placement into right common femoral artery from left femoral approach             3.  Aortogram and selective right lower extremity angiogram             4.  Mechanical thrombectomy of the right SFA and proximal popliteal artery with the [place] Rex device             5.  Percutaneous transluminal angioplasty of the right SFA and above-knee popliteal artery with a 5 mm diameter by 30 cm length Lutonix drug-coated angioplasty balloon and a 5 mm diameter by 15 cm length Lutonix drug-coated angioplasty:  6.  Viabahn stent placement x2 to the right SFA and popliteal arteries with a 6 mm diameter by 25 cm length stent and a 6 mm diameter by 10 cm length stent for multiple areas of greater than 50% stenosis after angioplasty             7.  StarClose closure device left femoral artery  EBL: 30 cc  Contrast: 55 cc  Fluoro Time: 80 minutes  Moderate Conscious Sedation Time: approximately 51 minutes using 3 mg of Versed and 100 mcg of Fentanyl              Indications:  Patient is a 50 y.o.female with disabling claudication symptoms and some early rest pain on the right.  She is already been treated for rest pain on the left with revascularization. The patient is brought in for angiography for further evaluation and potential treatment.  Due to the limb threatening nature of the situation, angiogram was performed for attempted limb salvage. The patient is aware that if the procedure fails, amputation would be expected.  The patient also understands that even with successful revascularization,  amputation may still be required due to the severity of the situation.  Risks and benefits are discussed and informed consent is obtained.   Procedure:  The patient was identified and appropriate procedural time out was performed.  The patient was then placed supine on the table and prepped and draped in the usual sterile fashion. Moderate conscious sedation was administered during a face to face encounter with the patient throughout the procedure with my supervision of the RN administering medicines and monitoring the patient's vital signs, pulse oximetry, telemetry and mental status throughout from the start of the procedure until the patient was taken to the recovery room. Ultrasound was used to evaluate the left common femoral artery.  It was patent .  A digital ultrasound image was acquired.  A Seldinger needle was used to access the left common femoral artery under direct ultrasound guidance and a permanent image was performed.  A 0.035 J wire was advanced without resistance and a 5Fr sheath was placed. Selective right lower extremity angiogram was then performed. This demonstrated Right common femoral artery and profunda femoris artery were fairly normal.  The SFA was occluded just beyond its origin with reconstitution of the above-knee popliteal artery.  There was then three-vessel runoff distally without  high-grade focal stenosis in the tibial vessels. It was felt that it was in the patient's best interest to proceed with intervention after these images to avoid a second procedure and a larger amount of contrast and fluoroscopy based off of the findings from the initial angiogram. The patient was systemically heparinized and a 6 [place] Ansell sheath was then placed over the [employer] wire. I then used a Kumpe catheter and the advantage wire to navigate into the SFA occlusion and get across the occlusion tediously with a Nava cross catheter and the advantage wire confirming intraluminal flow in the  below-knee popliteal artery.  I then exchanged for a V 18 wire parking this distally in the posterior tibial artery.  Mechanical thrombectomy was then performed to the right SFA and above-knee popliteal arteries to debulk the chronic thrombus.  This was done with the [place] Rex device and 2 passes were made throughout the right SFA and popliteal arteries.  Imaging following this showed a channel now with some flow but there remained severely diseased SFA and proximal popliteal arteries.  I then treated this first with angioplasty using a 5 mm diameter by 30 cm length Lutonix drug-coated angioplasty balloon as well as a 5 mm diameter by 15 cm length Lutonix drug-coated angioplasty balloon to treat from the above-knee popliteal artery up to the common femoral artery encompassing the entire SFA.  These inflations were 10 to 12 atm for a minute.  Completion imaging showed severe residual disease throughout requiring stent placement.  A 6 mm diameter by 25 cm length Viabahn stent was then deployed from just above the knee to the proximal to mid right SFA.  6 mm diameter by 10 cm length Viabahn stent was then started about a centimeter below the origin of the SFA and bridging into the first stent slightly.  This was postdilated with a 5 mm balloon with no areas of greater than 10 to 15% residual stenosis after stent placement. I elected to terminate the procedure. The sheath was removed and StarClose closure device was deployed in the left femoral artery with excellent hemostatic result. The patient was taken to the recovery room in stable condition having tolerated the procedure well.  Findings:                           Right lower Extremity: Right common femoral artery and profunda femoris artery were fairly normal.  The SFA was occluded just beyond its origin with reconstitution of the above-knee popliteal artery.  There was then three-vessel runoff distally without high-grade focal stenosis in the tibial  vessels.   Disposition: Patient was taken to the recovery room in stable condition having tolerated the procedure well.  Complications: None  Leotis Pain 12/25/2020 12:26 PM   This note was created with Dragon Medical transcription system. Any errors in dictation are purely unintentional.

## 2020-12-26 ENCOUNTER — Ambulatory Visit: Payer: No Typology Code available for payment source | Admitting: Medical

## 2020-12-26 ENCOUNTER — Encounter: Payer: Self-pay | Admitting: Vascular Surgery

## 2020-12-31 ENCOUNTER — Other Ambulatory Visit: Payer: Self-pay

## 2020-12-31 ENCOUNTER — Encounter: Payer: No Typology Code available for payment source | Attending: Internal Medicine | Admitting: *Deleted

## 2020-12-31 DIAGNOSIS — Z48812 Encounter for surgical aftercare following surgery on the circulatory system: Secondary | ICD-10-CM | POA: Insufficient documentation

## 2020-12-31 DIAGNOSIS — I214 Non-ST elevation (NSTEMI) myocardial infarction: Secondary | ICD-10-CM

## 2020-12-31 DIAGNOSIS — Z955 Presence of coronary angioplasty implant and graft: Secondary | ICD-10-CM

## 2020-12-31 DIAGNOSIS — I252 Old myocardial infarction: Secondary | ICD-10-CM | POA: Insufficient documentation

## 2020-12-31 DIAGNOSIS — Z952 Presence of prosthetic heart valve: Secondary | ICD-10-CM | POA: Insufficient documentation

## 2020-12-31 NOTE — Progress Notes (Signed)
Initial telephone orientation completed. Diagnosis can be found in [facility] 11/15. EP orientation scheduled for Thursday 12/29 at 9:30.

## 2021-01-13 ENCOUNTER — Other Ambulatory Visit (INDEPENDENT_AMBULATORY_CARE_PROVIDER_SITE_OTHER): Payer: Self-pay | Admitting: Vascular Surgery

## 2021-01-13 DIAGNOSIS — I70221 Atherosclerosis of native arteries of extremities with rest pain, right leg: Secondary | ICD-10-CM

## 2021-01-13 DIAGNOSIS — Z95820 Peripheral vascular angioplasty status with implants and grafts: Secondary | ICD-10-CM

## 2021-01-15 ENCOUNTER — Encounter (INDEPENDENT_AMBULATORY_CARE_PROVIDER_SITE_OTHER): Payer: No Typology Code available for payment source

## 2021-01-15 ENCOUNTER — Ambulatory Visit (INDEPENDENT_AMBULATORY_CARE_PROVIDER_SITE_OTHER): Payer: No Typology Code available for payment source | Admitting: Nurse Practitioner

## 2021-01-15 ENCOUNTER — Encounter (INDEPENDENT_AMBULATORY_CARE_PROVIDER_SITE_OTHER): Payer: Self-pay

## 2021-01-20 ENCOUNTER — Telehealth (INDEPENDENT_AMBULATORY_CARE_PROVIDER_SITE_OTHER): Payer: Self-pay

## 2021-01-20 NOTE — Telephone Encounter (Signed)
Patient was made aware that we haven't receive the fax from disability.I advise the patient to contact the disability company to have them refax requesting form

## 2021-01-20 NOTE — Telephone Encounter (Signed)
Patient called in stating her disability company has faxed [place] forms on 12/19 and they havent received any updates, wanting to know if we have received anything. Beth Carroll then states that they are looking for medical records from a couple of appointments, advised she reach out to medical records to see if they have received anything.

## 2021-01-21 ENCOUNTER — Ambulatory Visit (INDEPENDENT_AMBULATORY_CARE_PROVIDER_SITE_OTHER): Payer: No Typology Code available for payment source

## 2021-01-21 ENCOUNTER — Ambulatory Visit (INDEPENDENT_AMBULATORY_CARE_PROVIDER_SITE_OTHER): Payer: No Typology Code available for payment source | Admitting: Nurse Practitioner

## 2021-01-21 ENCOUNTER — Encounter (INDEPENDENT_AMBULATORY_CARE_PROVIDER_SITE_OTHER): Payer: Self-pay | Admitting: Nurse Practitioner

## 2021-01-21 VITALS — BP 147/83 | HR 74 | Resp 16 | Wt 110.6 lb

## 2021-01-21 DIAGNOSIS — I10 Essential (primary) hypertension: Secondary | ICD-10-CM

## 2021-01-21 DIAGNOSIS — I70221 Atherosclerosis of native arteries of extremities with rest pain, right leg: Secondary | ICD-10-CM | POA: Diagnosis not present

## 2021-01-21 DIAGNOSIS — Z95820 Peripheral vascular angioplasty status with implants and grafts: Secondary | ICD-10-CM

## 2021-01-21 DIAGNOSIS — E119 Type 2 diabetes mellitus without complications: Secondary | ICD-10-CM

## 2021-01-22 ENCOUNTER — Other Ambulatory Visit: Payer: Self-pay

## 2021-01-22 VITALS — Ht 64.25 in | Wt 107.9 lb

## 2021-01-22 DIAGNOSIS — I214 Non-ST elevation (NSTEMI) myocardial infarction: Secondary | ICD-10-CM

## 2021-01-22 DIAGNOSIS — Z952 Presence of prosthetic heart valve: Secondary | ICD-10-CM | POA: Diagnosis not present

## 2021-01-22 DIAGNOSIS — Z48812 Encounter for surgical aftercare following surgery on the circulatory system: Secondary | ICD-10-CM | POA: Diagnosis not present

## 2021-01-22 DIAGNOSIS — I252 Old myocardial infarction: Secondary | ICD-10-CM | POA: Diagnosis not present

## 2021-01-22 DIAGNOSIS — Z955 Presence of coronary angioplasty implant and graft: Secondary | ICD-10-CM

## 2021-01-22 NOTE — Patient Instructions (Signed)
Patient Instructions  Patient Details  Name: Beth Carroll MRN: [id number] Date of Birth: 1970-02-09 Referring Provider:  Baxter Hire, MD  Below are your personal goals for exercise, nutrition, and risk factors. Our goal is to help you stay on track towards obtaining and maintaining these goals. We will be discussing your progress on these goals with you throughout the program.  Initial Exercise Prescription:  Initial Exercise Prescription - 01/22/21 1100       Date of Initial Exercise RX and Referring Provider   Date 01/22/21    Referring Provider Harrel Lemon MD      Oxygen   Maintain Oxygen Saturation 88% or higher      Recumbant Bike   Level 1    RPM 60    Watts 15    Minutes 15    METs 2.8      NuStep   Level 1    SPM 80    Minutes 15    METs 2.8      Biostep-RELP   Level 1    SPM 50    Minutes 15    METs 2.8      Track   Laps 14    Minutes 15    METs 1.76      Prescription Details   Frequency (times per week) 3    Duration Progress to 30 minutes of continuous aerobic without signs/symptoms of physical distress      Intensity   THRR 40-80% of Max Heartrate 115-151    Ratings of Perceived Exertion 11-13    Perceived Dyspnea 0-4      Progression   Progression Continue to progress workloads to maintain intensity without signs/symptoms of physical distress.      Resistance Training   Training Prescription Yes    Weight 2 lb    Reps 10-15             Exercise Goals: Frequency: Be able to perform aerobic exercise two to three times per week in program working toward 2-5 days per week of home exercise.  Intensity: Work with a perceived exertion of 11 (fairly light) - 15 (hard) while following your exercise prescription.  We will make changes to your prescription with you as you progress through the program.   Duration: Be able to do 30 to 45 minutes of continuous aerobic exercise in addition to a 5 minute warm-up and a 5 minute cool-down  routine.   Nutrition Goals: Your personal nutrition goals will be established when you do your nutrition analysis with the dietician.  The following are general nutrition guidelines to follow: Cholesterol < 200mg /day Sodium < 1500mg /day Fiber: Women over 50 yrs - 21 grams per day  Personal Goals:  Personal Goals and Risk Factors at Admission - 01/22/21 1156       Core Components/Risk Factors/Patient Goals on Admission    Weight Management Yes;Weight Gain    Intervention Weight Management: Develop a combined nutrition and exercise program designed to reach desired caloric intake, while maintaining appropriate intake of nutrient and fiber, sodium and fats, and appropriate energy expenditure required for the weight goal.;Weight Management: Provide education and appropriate resources to help participant work on and attain dietary goals.;Weight Management/Obesity: Establish reasonable short term and long term weight goals.    Admit Weight 107 lb (48.5 kg)    Goal Weight: Short Term 109 lb (49.4 kg)    Goal Weight: Long Term 115 lb (52.2 kg)    Expected Outcomes Short Term: Continue  to assess and modify interventions until short term weight is achieved;Long Term: Adherence to nutrition and physical activity/exercise program aimed toward attainment of established weight goal;Understanding recommendations for meals to include 15-35% energy as protein, 25-35% energy from fat, 35-60% energy from carbohydrates, less than 200mg  of dietary cholesterol, 20-35 gm of total fiber daily;Understanding of distribution of calorie intake throughout the day with the consumption of 4-5 meals/snacks;Weight Gain: Understanding of general recommendations for a high calorie, high protein meal plan that promotes weight gain by distributing calorie intake throughout the day with the consumption for 4-5 meals, snacks, and/or supplements    Tobacco Cessation Yes    Number of packs per day quit 12/09/20    Intervention Offer  self-teaching materials, assist with locating and accessing local/national Quit Smoking programs, and support quit date choice.    Expected Outcomes Short Term: Will quit all tobacco product use, adhering to prevention of relapse plan.;Long Term: Complete abstinence from all tobacco products for at least 12 months from quit date.    Diabetes Yes    Intervention Provide education about signs/symptoms and action to take for hypo/hyperglycemia.;Provide education about proper nutrition, including hydration, and aerobic/resistive exercise prescription along with prescribed medications to achieve blood glucose in normal ranges: Fasting glucose 65-99 mg/dL    Expected Outcomes Short Term: Participant verbalizes understanding of the signs/symptoms and immediate care of hyper/hypoglycemia, proper foot care and importance of medication, aerobic/resistive exercise and nutrition plan for blood glucose control.;Long Term: Attainment of HbA1C < 7%.    Hypertension Yes    Intervention Provide education on lifestyle modifcations including regular physical activity/exercise, weight management, moderate sodium restriction and increased consumption of fresh fruit, vegetables, and low fat dairy, alcohol moderation, and smoking cessation.;Monitor prescription use compliance.    Expected Outcomes Short Term: Continued assessment and intervention until BP is < 140/54mm HG in hypertensive participants. < 130/49mm HG in hypertensive participants with diabetes, heart failure or chronic kidney disease.;Long Term: Maintenance of blood pressure at goal levels.             Tobacco Use Initial Evaluation: Social History   Tobacco Use  Smoking Status Former   Types: Cigarettes   Quit date: 12/09/2020   Years since quitting: 0.1  Smokeless Tobacco Never    Exercise Goals and Review:  Exercise Goals     Row Name 01/22/21 1154             Exercise Goals   Increase Physical Activity Yes       Intervention Provide  advice, education, support and counseling about physical activity/exercise needs.;Develop an individualized exercise prescription for aerobic and resistive training based on initial evaluation findings, risk stratification, comorbidities and participant's personal goals.       Expected Outcomes Short Term: Attend rehab on a regular basis to increase amount of physical activity.;Long Term: Add in home exercise to make exercise part of routine and to increase amount of physical activity.;Long Term: Exercising regularly at least 3-5 days a week.       Increase Strength and Stamina Yes       Intervention Provide advice, education, support and counseling about physical activity/exercise needs.;Develop an individualized exercise prescription for aerobic and resistive training based on initial evaluation findings, risk stratification, comorbidities and participant's personal goals.       Expected Outcomes Short Term: Increase workloads from initial exercise prescription for resistance, speed, and METs.;Short Term: Perform resistance training exercises routinely during rehab and add in resistance training at home;Long Term: Improve  cardiorespiratory fitness, muscular endurance and strength as measured by increased METs and functional capacity (6MWT)       Able to understand and use rate of perceived exertion (RPE) scale Yes       Intervention Provide education and explanation on how to use RPE scale       Expected Outcomes Short Term: Able to use RPE daily in rehab to express subjective intensity level;Long Term:  Able to use RPE to guide intensity level when exercising independently       Able to understand and use Dyspnea scale Yes       Intervention Provide education and explanation on how to use Dyspnea scale       Expected Outcomes Short Term: Able to use Dyspnea scale daily in rehab to express subjective sense of shortness of breath during exertion;Long Term: Able to use Dyspnea scale to guide intensity  level when exercising independently       Knowledge and understanding of Target Heart Rate Range (THRR) Yes       Intervention Provide education and explanation of THRR including how the numbers were predicted and where they are located for reference       Expected Outcomes Short Term: Able to state/look up THRR;Long Term: Able to use THRR to govern intensity when exercising independently;Short Term: Able to use daily as guideline for intensity in rehab       Able to check pulse independently Yes       Intervention Provide education and demonstration on how to check pulse in carotid and radial arteries.;Review the importance of being able to check your own pulse for safety during independent exercise       Expected Outcomes Short Term: Able to explain why pulse checking is important during independent exercise;Long Term: Able to check pulse independently and accurately       Understanding of Exercise Prescription Yes       Intervention Provide education, explanation, and written materials on patient's individual exercise prescription       Expected Outcomes Short Term: Able to explain program exercise prescription;Long Term: Able to explain home exercise prescription to exercise independently       Improve claudication pain toleration; Improve walking ability Yes       Intervention Attend education sessions to aid in risk factor modification and understanding of disease process       Expected Outcomes Short Term: Improve walking distance/time to onset of claudication pain;Long Term: Improve walking ability and toleration to claudication                Copy of goals given to participant.

## 2021-01-22 NOTE — Progress Notes (Signed)
Cardiac Individual Treatment Plan  Patient Details  Name: Rashida Ladouceur MRN: [id number] Date of Birth: Jan 17, 1971 Referring Provider:   Flowsheet Row Cardiac Rehab from 01/22/2021 in [facility] Cardiac and Pulmonary Rehab  Referring Provider Harrel Lemon MD       Initial Encounter Date:  Flowsheet Row Cardiac Rehab from 01/22/2021 in [facility] Cardiac and Pulmonary Rehab  Date 01/22/21       Visit Diagnosis: NSTEMI (non-ST elevated myocardial infarction) [facility])  Status post coronary artery stent placement  Patient's Home Medications on Admission:  Current Outpatient Medications:    albuterol (VENTOLIN HFA) 108 (90 Base) MCG/ACT inhaler, Inhale 2 puffs into the lungs every 6 (six) hours as needed., Disp: , Rfl:    aspirin EC 81 MG EC tablet, Take 1 tablet (81 mg total) by mouth daily. Swallow whole., Disp: 30 tablet, Rfl: 2   atorvastatin (LIPITOR) 20 MG tablet, Take 1 tablet (20 mg total) by mouth daily., Disp: 30 tablet, Rfl: 0   carvedilol (COREG) 3.125 MG tablet, Take 1 tablet (3.125 mg total) by mouth 2 (two) times daily with a meal., Disp: 60 tablet, Rfl: 0   clopidogrel (PLAVIX) 75 MG tablet, Take 1 tablet (75 mg total) by mouth daily., Disp: 30 tablet, Rfl: 1   furosemide (LASIX) 20 MG tablet, Take 20 mg by mouth daily., Disp: , Rfl:    HYDROcodone-acetaminophen (NORCO/VICODIN) 5-325 MG tablet, Take 1 tablet by mouth every 6 (six) hours as needed for severe pain. (Patient not taking: Reported on 12/25/2020), Disp: 15 tablet, Rfl: 0   insulin glargine (LANTUS SOLOSTAR) 100 UNIT/ML Solostar Pen, Inject 25 Units into the skin daily., Disp: , Rfl:    pantoprazole (PROTONIX) 40 MG tablet, Take 1 tablet (40 mg total) by mouth daily., Disp: 30 tablet, Rfl: 0   Vitamin D, Ergocalciferol, (DRISDOL) 1.25 MG (50000 UNIT) CAPS capsule, Take 50,000 Units by mouth once a week., Disp: , Rfl:   Past Medical History: Past Medical History:  Diagnosis Date   Diabetes mellitus without complication  ([place])    Hypertension     Tobacco Use: Social History   Tobacco Use  Smoking Status Former   Types: Cigarettes   Quit date: 12/09/2020   Years since quitting: 0.1  Smokeless Tobacco Never    Paitlyn has recently quit tobacco use within the last 6 months. Her quit date was 12/09/20.  Intervention for relapse prevention was provided at the initial medical review. She was encouraged to continue to with tobacco cessation and was provided information on relapse prevention. Patient received information about combination therapy, tobacco cessation classes, quit line, and quit smoking apps in case of a relapse. Patient demonstrated understanding of this material.Staff will continue to provide encouragement and follow up with the patient throughout the program.      Labs: Recent Review Flowsheet Data     Labs for ITP Cardiac and Pulmonary Rehab Latest Ref Rng & Units 12/10/2020 12/12/2020   Cholestrol 0 - 200 mg/dL - 120   LDLCALC 0 - 99 mg/dL - 63   HDL >40 mg/dL - 45   Trlycerides <150 mg/dL - 59   Hemoglobin A1c 4.8 - 5.6 % 13.7(H) -        Exercise Target Goals: Exercise Program Goal: Individual exercise prescription set using results from initial 6 min walk test and THRR while considering  patients activity barriers and safety.   Exercise Prescription Goal: Initial exercise prescription builds to 30-45 minutes a day of aerobic activity, 2-3 days per week.  Home exercise guidelines will be given to patient during program as part of exercise prescription that the participant will acknowledge.   Education: Aerobic Exercise: - Group verbal and visual presentation on the components of exercise prescription. Introduces F.I.T.T principle from ACSM for exercise prescriptions.  Reviews F.I.T.T. principles of aerobic exercise including progression. Written material given at graduation. Flowsheet Row Cardiac Rehab from 01/22/2021 in [facility] Cardiac and Pulmonary Rehab  Education need  identified 01/22/21       Education: Resistance Exercise: - Group verbal and visual presentation on the components of exercise prescription. Introduces F.I.T.T principle from ACSM for exercise prescriptions  Reviews F.I.T.T. principles of resistance exercise including progression. Written material given at graduation.    Education: Exercise & Equipment Safety: - Individual verbal instruction and demonstration of equipment use and safety with use of the equipment. Flowsheet Row Cardiac Rehab from 01/22/2021 in [facility] Cardiac and Pulmonary Rehab  Education need identified 01/22/21  Date 01/22/21  Educator [place]  Instruction Review Code 1- Verbalizes Understanding       Education: Exercise Physiology & General Exercise Guidelines: - Group verbal and written instruction with models to review the exercise physiology of the cardiovascular system and associated critical values. Provides general exercise guidelines with specific guidelines to those with heart or lung disease.    Education: Flexibility, Balance, Mind/Body Relaxation: - Group verbal and visual presentation with interactive activity on the components of exercise prescription. Introduces F.I.T.T principle from ACSM for exercise prescriptions. Reviews F.I.T.T. principles of flexibility and balance exercise training including progression. Also discusses the mind body connection.  Reviews various relaxation techniques to help reduce and manage stress (i.e. Deep breathing, progressive muscle relaxation, and visualization). Balance handout provided to take home. Written material given at graduation.   Activity Barriers & Risk Stratification:  Activity Barriers & Cardiac Risk Stratification - 01/22/21 1110       Activity Barriers & Cardiac Risk Stratification   Activity Barriers Other (comment);Deconditioning;Muscular [employer] Device    Comments PAD, recent stents in legs (left has some swelling still)    Cardiac Risk  Stratification High             6 Minute Walk:  6 Minute Walk     Row Name 01/22/21 1116         6 Minute Walk   Phase Initial     Distance 580 feet     Walk Time 6 minutes     # of Rest Breaks 0     MPH 1.09     METS 2.88     RPE 11     Perceived Dyspnea  0     VO2 Peak 10.11     Symptoms Yes (comment)     Comments Left leg sore     Resting HR 80 bpm     Resting BP 100/64     Resting Oxygen Saturation  99 %     Exercise Oxygen Saturation  during 6 min walk 96 %     Max Ex. HR 88 bpm     Max Ex. BP 114/64     2 Minute Post BP 96/62              Oxygen Initial Assessment:   Oxygen Re-Evaluation:   Oxygen Discharge (Final Oxygen Re-Evaluation):   Initial Exercise Prescription:  Initial Exercise Prescription - 01/22/21 1100       Date of Initial Exercise RX and Referring Provider   Date 01/22/21    Referring Provider  Harrel Lemon MD      Oxygen   Maintain Oxygen Saturation 88% or higher      Recumbant Bike   Level 1    RPM 60    Watts 15    Minutes 15    METs 2.8      NuStep   Level 1    SPM 80    Minutes 15    METs 2.8      Biostep-RELP   Level 1    SPM 50    Minutes 15    METs 2.8      Track   Laps 14    Minutes 15    METs 1.76      Prescription Details   Frequency (times per week) 3    Duration Progress to 30 minutes of continuous aerobic without signs/symptoms of physical distress      Intensity   THRR 40-80% of Max Heartrate 115-151    Ratings of Perceived Exertion 11-13    Perceived Dyspnea 0-4      Progression   Progression Continue to progress workloads to maintain intensity without signs/symptoms of physical distress.      Resistance Training   Training Prescription Yes    Weight 2 lb    Reps 10-15             Perform Capillary Blood Glucose checks as needed.  Exercise Prescription Changes:   Exercise Prescription Changes     Row Name 01/22/21 1100             Response to Exercise   Blood  Pressure (Admit) 100/64       Blood Pressure (Exercise) 114/64       Blood Pressure (Exit) 96/62       Heart Rate (Admit) 80 bpm       Heart Rate (Exercise) 88 bpm       Heart Rate (Exit) 78 bpm       Oxygen Saturation (Admit) 99 %       Oxygen Saturation (Exercise) 96 %       Rating of Perceived Exertion (Exercise) 11       Perceived Dyspnea (Exercise) 0       Symptoms Left leg sore       Comments walk test results                Exercise Comments:   Exercise Goals and Review:   Exercise Goals     Row Name 01/22/21 1154             Exercise Goals   Increase Physical Activity Yes       Intervention Provide advice, education, support and counseling about physical activity/exercise needs.;Develop an individualized exercise prescription for aerobic and resistive training based on initial evaluation findings, risk stratification, comorbidities and participant's personal goals.       Expected Outcomes Short Term: Attend rehab on a regular basis to increase amount of physical activity.;Long Term: Add in home exercise to make exercise part of routine and to increase amount of physical activity.;Long Term: Exercising regularly at least 3-5 days a week.       Increase Strength and Stamina Yes       Intervention Provide advice, education, support and counseling about physical activity/exercise needs.;Develop an individualized exercise prescription for aerobic and resistive training based on initial evaluation findings, risk stratification, comorbidities and participant's personal goals.       Expected Outcomes Short Term: Increase workloads from initial  exercise prescription for resistance, speed, and METs.;Short Term: Perform resistance training exercises routinely during rehab and add in resistance training at home;Long Term: Improve cardiorespiratory fitness, muscular endurance and strength as measured by increased METs and functional capacity (6MWT)       Able to understand and use  rate of perceived exertion (RPE) scale Yes       Intervention Provide education and explanation on how to use RPE scale       Expected Outcomes Short Term: Able to use RPE daily in rehab to express subjective intensity level;Long Term:  Able to use RPE to guide intensity level when exercising independently       Able to understand and use Dyspnea scale Yes       Intervention Provide education and explanation on how to use Dyspnea scale       Expected Outcomes Short Term: Able to use Dyspnea scale daily in rehab to express subjective sense of shortness of breath during exertion;Long Term: Able to use Dyspnea scale to guide intensity level when exercising independently       Knowledge and understanding of Target Heart Rate Range (THRR) Yes       Intervention Provide education and explanation of THRR including how the numbers were predicted and where they are located for reference       Expected Outcomes Short Term: Able to state/look up THRR;Long Term: Able to use THRR to govern intensity when exercising independently;Short Term: Able to use daily as guideline for intensity in rehab       Able to check pulse independently Yes       Intervention Provide education and demonstration on how to check pulse in carotid and radial arteries.;Review the importance of being able to check your own pulse for safety during independent exercise       Expected Outcomes Short Term: Able to explain why pulse checking is important during independent exercise;Long Term: Able to check pulse independently and accurately       Understanding of Exercise Prescription Yes       Intervention Provide education, explanation, and written materials on patient's individual exercise prescription       Expected Outcomes Short Term: Able to explain program exercise prescription;Long Term: Able to explain home exercise prescription to exercise independently       Improve claudication pain toleration; Improve walking ability Yes        Intervention Attend education sessions to aid in risk factor modification and understanding of disease process       Expected Outcomes Short Term: Improve walking distance/time to onset of claudication pain;Long Term: Improve walking ability and toleration to claudication                Exercise Goals Re-Evaluation :   Discharge Exercise Prescription (Final Exercise Prescription Changes):  Exercise Prescription Changes - 01/22/21 1100       Response to Exercise   Blood Pressure (Admit) 100/64    Blood Pressure (Exercise) 114/64    Blood Pressure (Exit) 96/62    Heart Rate (Admit) 80 bpm    Heart Rate (Exercise) 88 bpm    Heart Rate (Exit) 78 bpm    Oxygen Saturation (Admit) 99 %    Oxygen Saturation (Exercise) 96 %    Rating of Perceived Exertion (Exercise) 11    Perceived Dyspnea (Exercise) 0    Symptoms Left leg sore    Comments walk test results  Nutrition:  Target Goals: Understanding of nutrition guidelines, daily intake of sodium 1500mg , cholesterol 200mg , calories 30% from fat and 7% or less from saturated fats, daily to have 5 or more servings of fruits and vegetables.  Education: All About Nutrition: -Group instruction provided by verbal, written material, interactive activities, discussions, models, and posters to present general guidelines for heart healthy nutrition including fat, fiber, MyPlate, the role of sodium in heart healthy nutrition, utilization of the nutrition label, and utilization of this knowledge for meal planning. Follow up email sent as well. Written material given at graduation. Flowsheet Row Cardiac Rehab from 01/22/2021 in [facility] Cardiac and Pulmonary Rehab  Education need identified 01/22/21       Biometrics:  Pre Biometrics - 01/22/21 1109       Pre Biometrics   Height 5' 4.25" (1.632 m)    Weight 107 lb 14.4 oz (48.9 kg)    BMI (Calculated) 18.38              Nutrition Therapy Plan and Nutrition Goals:   Nutrition Therapy & Goals - 01/22/21 1125       Intervention Plan   Intervention Prescribe, educate and counsel regarding individualized specific dietary modifications aiming towards targeted core components such as weight, hypertension, lipid management, diabetes, heart failure and other comorbidities.    Expected Outcomes Short Term Goal: Understand basic principles of dietary content, such as calories, fat, sodium, cholesterol and nutrients.;Short Term Goal: A plan has been developed with personal nutrition goals set during dietitian appointment.;Long Term Goal: Adherence to prescribed nutrition plan.             Nutrition Assessments:  MEDIFICTS Score Key: ?70 Need to make dietary changes  40-70 Heart Healthy Diet ? 40 Therapeutic Level Cholesterol Diet  Flowsheet Row Cardiac Rehab from 01/22/2021 in [facility] Cardiac and Pulmonary Rehab  Picture Your Plate Total Score on Admission 78      Picture Your Plate Scores: <34 Unhealthy dietary pattern with much room for improvement. 41-50 Dietary pattern unlikely to meet recommendations for good health and room for improvement. 51-60 More healthful dietary pattern, with some room for improvement.  >60 Healthy dietary pattern, although there may be some specific behaviors that could be improved.    Nutrition Goals Re-Evaluation:   Nutrition Goals Discharge (Final Nutrition Goals Re-Evaluation):   Psychosocial: Target Goals: Acknowledge presence or absence of significant depression and/or stress, maximize coping skills, provide positive support system. Participant is able to verbalize types and ability to use techniques and skills needed for reducing stress and depression.   Education: Stress, Anxiety, and Depression - Group verbal and visual presentation to define topics covered.  Reviews how body is impacted by stress, anxiety, and depression.  Also discusses healthy ways to reduce stress and to treat/manage anxiety and depression.   Written material given at graduation.   Education: Sleep Hygiene -Provides group verbal and written instruction about how sleep can affect your health.  Define sleep hygiene, discuss sleep cycles and impact of sleep habits. Review good sleep hygiene tips.    Initial Review & Psychosocial Screening:  Initial Psych Review & Screening - 12/31/20 1038       Initial Review   Current issues with None Identified      Family Dynamics   Good Support System? Yes   son, boyfriend     Barriers   Psychosocial barriers to participate in program There are no identifiable barriers or psychosocial needs.;The patient should benefit from training in stress  management and relaxation.      Screening Interventions   Interventions Encouraged to exercise;Provide feedback about the scores to participant;To provide support and resources with identified psychosocial needs    Expected Outcomes Short Term goal: Utilizing psychosocial counselor, staff and physician to assist with identification of specific Stressors or current issues interfering with healing process. Setting desired goal for each stressor or current issue identified.;Long Term Goal: Stressors or current issues are controlled or eliminated.;Short Term goal: Identification and review with participant of any Quality of Life or Depression concerns found by scoring the questionnaire.;Long Term goal: The participant improves quality of Life and PHQ9 Scores as seen by post scores and/or verbalization of changes             Quality of Life Scores:   Quality of Life - 01/22/21 1124       Quality of Life   Select Quality of Life      Quality of Life Scores   Health/Function Pre 24 %    Socioeconomic Pre 26.25 %    Psych/Spiritual Pre 30 %    Family Pre 28.8 %    GLOBAL Pre 26.4 %            Scores of 19 and below usually indicate a poorer quality of life in these areas.  A difference of  2-3 points is a clinically meaningful difference.  A  difference of 2-3 points in the total score of the Quality of Life Index has been associated with significant improvement in overall quality of life, self-image, physical symptoms, and general health in studies assessing change in quality of life.  PHQ-9: Recent Review Flowsheet Data     Depression screen [facility] 2/9 01/22/2021   Decreased Interest 0   Down, Depressed, Hopeless 0   PHQ - 2 Score 0   Altered sleeping 1   Tired, decreased energy 1   Change in appetite 0   Feeling bad or failure about yourself  0   Trouble concentrating 0   Moving slowly or fidgety/restless 0   Suicidal thoughts 0   PHQ-9 Score 2   Difficult doing work/chores Somewhat difficult      Interpretation of Total Score  Total Score Depression Severity:  1-4 = Minimal depression, 5-9 = Mild depression, 10-14 = Moderate depression, 15-19 = Moderately severe depression, 20-27 = Severe depression   Psychosocial Evaluation and Intervention:  Psychosocial Evaluation - 12/31/20 1051       Psychosocial Evaluation & Interventions   Interventions Encouraged to exercise with the program and follow exercise prescription    Comments Benita reports doing well post NSTEMI and stent. She also received stents in her leg for PAD and states she still has some swelling in her left leg but for the most part can tell a huge positive difference in her symptoms. She is very motivated to change her lifestyle habits for the better. She has started eating heatlhier and paying attention to her diabetes more. She quit smoking the day of her heart attack. She plans on returning to work once the doctors clear her to do so. She states she doesn't have any current issues with stress, and feels very supported by her boyfriend and son.    Expected Outcomes Short: attend cardiac rehab for education and exercise. Long: develop and maintain positive self care habits.    Continue Psychosocial Services  Follow up required by staff              Psychosocial Re-Evaluation:  Psychosocial Discharge (Final Psychosocial Re-Evaluation):   Vocational Rehabilitation: Provide vocational rehab assistance to qualifying candidates.   Vocational Rehab Evaluation & Intervention:  Vocational Rehab - 12/31/20 1038       Initial Vocational Rehab Evaluation & Intervention   Assessment shows need for Vocational Rehabilitation No             Education: Education Goals: Education classes will be provided on a variety of topics geared toward better understanding of heart health and risk factor modification. Participant will state understanding/return demonstration of topics presented as noted by education test scores.  Learning Barriers/Preferences:  Learning Barriers/Preferences - 12/31/20 1038       Learning Barriers/Preferences   Learning Barriers None    Learning Preferences None             General Cardiac Education Topics:  AED/CPR: - Group verbal and written instruction with the use of models to demonstrate the basic use of the AED with the basic ABC's of resuscitation.   Anatomy and Cardiac Procedures: - Group verbal and visual presentation and models provide information about basic cardiac anatomy and function. Reviews the testing methods done to diagnose heart disease and the outcomes of the test results. Describes the treatment choices: Medical Management, Angioplasty, or Coronary Bypass Surgery for treating various heart conditions including Myocardial Infarction, Angina, Valve Disease, and Cardiac Arrhythmias.  Written material given at graduation. Flowsheet Row Cardiac Rehab from 01/22/2021 in [facility] Cardiac and Pulmonary Rehab  Education need identified 01/22/21       Medication Safety: - Group verbal and visual instruction to review commonly prescribed medications for heart and lung disease. Reviews the medication, class of the drug, and side effects. Includes the steps to properly store meds and  maintain the prescription regimen.  Written material given at graduation.   Intimacy: - Group verbal instruction through game format to discuss how heart and lung disease can affect sexual intimacy. Written material given at graduation..   Know Your Numbers and Heart Failure: - Group verbal and visual instruction to discuss disease risk factors for cardiac and pulmonary disease and treatment options.  Reviews associated critical values for Overweight/Obesity, Hypertension, Cholesterol, and Diabetes.  Discusses basics of heart failure: signs/symptoms and treatments.  Introduces Heart Failure Zone chart for action plan for heart failure.  Written material given at graduation.   Infection Prevention: - Provides verbal and written material to individual with discussion of infection control including proper hand washing and proper equipment cleaning during exercise session. Flowsheet Row Cardiac Rehab from 01/22/2021 in [facility] Cardiac and Pulmonary Rehab  Education need identified 01/22/21  Date 01/22/21  Educator [place]  Instruction Review Code 1- Verbalizes Understanding       Falls Prevention: - Provides verbal and written material to individual with discussion of falls prevention and safety. Flowsheet Row Cardiac Rehab from 01/22/2021 in [facility] Cardiac and Pulmonary Rehab  Education need identified 01/22/21  Date 01/22/21  Educator [place]  Instruction Review Code 1- Verbalizes Understanding       Other: -Provides group and verbal instruction on various topics (see comments)   Knowledge Questionnaire Score:  Knowledge Questionnaire Score - 01/22/21 1121       Knowledge Questionnaire Score   Pre Score 21/26: HTN, Angina, Nutrition, Exercise, Smoking             Core Components/Risk Factors/Patient Goals at Admission:  Personal Goals and Risk Factors at Admission - 01/22/21 1156       Core Components/Risk Factors/Patient Goals on  Admission    Weight Management Yes;Weight Gain     Intervention Weight Management: Develop a combined nutrition and exercise program designed to reach desired caloric intake, while maintaining appropriate intake of nutrient and fiber, sodium and fats, and appropriate energy expenditure required for the weight goal.;Weight Management: Provide education and appropriate resources to help participant work on and attain dietary goals.;Weight Management/Obesity: Establish reasonable short term and long term weight goals.    Admit Weight 107 lb (48.5 kg)    Goal Weight: Short Term 109 lb (49.4 kg)    Goal Weight: Long Term 115 lb (52.2 kg)    Expected Outcomes Short Term: Continue to assess and modify interventions until short term weight is achieved;Long Term: Adherence to nutrition and physical activity/exercise program aimed toward attainment of established weight goal;Understanding recommendations for meals to include 15-35% energy as protein, 25-35% energy from fat, 35-60% energy from carbohydrates, less than 200mg  of dietary cholesterol, 20-35 gm of total fiber daily;Understanding of distribution of calorie intake throughout the day with the consumption of 4-5 meals/snacks;Weight Gain: Understanding of general recommendations for a high calorie, high protein meal plan that promotes weight gain by distributing calorie intake throughout the day with the consumption for 4-5 meals, snacks, and/or supplements    Tobacco Cessation Yes    Number of packs per day quit 12/09/20    Intervention Offer self-teaching materials, assist with locating and accessing local/national Quit Smoking programs, and support quit date choice.    Expected Outcomes Short Term: Will quit all tobacco product use, adhering to prevention of relapse plan.;Long Term: Complete abstinence from all tobacco products for at least 12 months from quit date.    Diabetes Yes    Intervention Provide education about signs/symptoms and action to take for hypo/hyperglycemia.;Provide education about  proper nutrition, including hydration, and aerobic/resistive exercise prescription along with prescribed medications to achieve blood glucose in normal ranges: Fasting glucose 65-99 mg/dL    Expected Outcomes Short Term: Participant verbalizes understanding of the signs/symptoms and immediate care of hyper/hypoglycemia, proper foot care and importance of medication, aerobic/resistive exercise and nutrition plan for blood glucose control.;Long Term: Attainment of HbA1C < 7%.    Hypertension Yes    Intervention Provide education on lifestyle modifcations including regular physical activity/exercise, weight management, moderate sodium restriction and increased consumption of fresh fruit, vegetables, and low fat dairy, alcohol moderation, and smoking cessation.;Monitor prescription use compliance.    Expected Outcomes Short Term: Continued assessment and intervention until BP is < 140/57mm HG in hypertensive participants. < 130/52mm HG in hypertensive participants with diabetes, heart failure or chronic kidney disease.;Long Term: Maintenance of blood pressure at goal levels.             Education:Diabetes - Individual verbal and written instruction to review signs/symptoms of diabetes, desired ranges of glucose level fasting, after meals and with exercise. Acknowledge that pre and post exercise glucose checks will be done for 3 sessions at entry of program. [facility] [place] [facility] from 01/22/2021 in [facility]  Education need identified 01/22/21  Date 01/22/21  Educator [place]  Instruction Review Code 1- Verbalizes Understanding       Core Components/Risk Factors/Patient Goals Review:    Core Components/Risk Factors/Patient Goals at Discharge (Final Review):    ITP Comments:  ITP Comments     Gordo Name 12/31/20 1050 01/22/21 1109 01/22/21 1122       ITP Comments Initial telephone orientation completed. Diagnosis can be found in [facility] 11/15. EP orientation  scheduled  for Thursday 12/29 at 9:30. Completed 6MWT and gym orientation. Initial ITP created and sent for review to Dr. Emily Filbert,  Medical Director. Elaya has recently quit tobacco use within the last 6 months. Her quit date was on 12/09/20.  Intervention for relapse prevention was provided at the initial medical review. He was encouraged to continue to with tobacco cessation and was provided information on relapse prevention. Patient received information about combination therapy, tobacco cessation classes, quit line, and quit smoking apps in case of a relapse. Patient demonstrated understanding of this material.Staff will continue to provide encouragement and follow up with the patient throughout the program.              Comments: Initial ITP

## 2021-01-28 ENCOUNTER — Other Ambulatory Visit: Payer: Self-pay

## 2021-01-28 ENCOUNTER — Encounter: Payer: No Typology Code available for payment source | Attending: Internal Medicine

## 2021-01-28 ENCOUNTER — Encounter (INDEPENDENT_AMBULATORY_CARE_PROVIDER_SITE_OTHER): Payer: Self-pay | Admitting: Nurse Practitioner

## 2021-01-28 DIAGNOSIS — I252 Old myocardial infarction: Secondary | ICD-10-CM | POA: Insufficient documentation

## 2021-01-28 DIAGNOSIS — I214 Non-ST elevation (NSTEMI) myocardial infarction: Secondary | ICD-10-CM

## 2021-01-28 DIAGNOSIS — Z48812 Encounter for surgical aftercare following surgery on the circulatory system: Secondary | ICD-10-CM | POA: Insufficient documentation

## 2021-01-28 DIAGNOSIS — E119 Type 2 diabetes mellitus without complications: Secondary | ICD-10-CM | POA: Diagnosis not present

## 2021-01-28 DIAGNOSIS — Z955 Presence of coronary angioplasty implant and graft: Secondary | ICD-10-CM | POA: Diagnosis not present

## 2021-01-28 LAB — GLUCOSE, CAPILLARY: Glucose-Capillary: 352 mg/dL — ABNORMAL HIGH (ref 70–99)

## 2021-01-28 NOTE — Progress Notes (Signed)
Subjective:    Patient ID: Beth Carroll, female    DOB: 1971/01/19, 51 y.o.   MRN: [id number] Chief Complaint  Patient presents with   Follow-up    [facility] 3week follow up    The patient returns to the office for followup and review status post angiogram with intervention. The patient notes improvement in the lower extremity symptoms. No interval shortening of the patient's claudication distance or rest pain symptoms. Previous wounds have now healed.  No new ulcers or wounds have occurred since the last visit.  There have been no significant changes to the patient's overall health care.  The patient denies amaurosis fugax or recent TIA symptoms. There are no recent neurological changes noted. The patient denies history of DVT, PE or superficial thrombophlebitis. The patient denies recent episodes of angina or shortness of breath.   ABI's Rt=1.04 and Lt=1.07  (previous ABI's Rt=0.4 and Lt=0.3) Duplex US of the bilateral lower extremity shows triphasic waveforms of the toe waveforms bilaterally   Review of Systems  Neurological:  Positive for weakness.  All other systems reviewed and are negative.     Objective:   Physical Exam Vitals reviewed.  HENT:     Head: Normocephalic.  Cardiovascular:     Rate and Rhythm: Normal rate.     Pulses: Normal pulses.  Pulmonary:     Effort: Pulmonary effort is normal.  Skin:    General: Skin is warm and dry.  Neurological:     Mental Status: She is alert and oriented to person, place, and time.  Psychiatric:        Mood and Affect: Mood normal.        Behavior: Behavior normal.        Thought Content: Thought content normal.        Judgment: Judgment normal.    BP (!) 147/83 (BP Location: Left Arm)    Pulse 74    Resp 16    Wt 110 lb 9.6 oz (50.2 kg)    BMI 17.85 kg/m   Past Medical History:  Diagnosis Date   Diabetes mellitus without complication (HCC)    Hypertension     Social History   Socioeconomic History   Marital  status: Single    Spouse name: Not on file   Number of children: Not on file   Years of education: Not on file   Highest education level: Not on file  Occupational History   Not on file  Tobacco Use   Smoking status: Former    Types: Cigarettes    Quit date: 12/09/2020    Years since quitting: 0.1   Smokeless tobacco: Never  Substance and Sexual Activity   Alcohol use: Never   Drug use: Not on file   Sexual activity: Not on file  Other Topics Concern   Not on file  Social History Narrative   Not on file   Social Determinants of Health   Financial Resource Strain: Not on file  Food Insecurity: Not on file  Transportation Needs: Not on file  Physical Activity: Not on file  Stress: Not on file  Social Connections: Not on file  Intimate Partner Violence: Not on file    Past Surgical History:  Procedure Laterality Date   CORONARY STENT INTERVENTION N/A 12/15/2020   Procedure: CORONARY STENT INTERVENTION;  Surgeon: Wellington Hampshire, MD;  Location: [place] [facility];  Service: Cardiovascular;  Laterality: N/A;   LEFT HEART CATH AND CORONARY ANGIOGRAPHY N/A 12/15/2020  Procedure: LEFT HEART CATH AND CORONARY ANGIOGRAPHY;  Surgeon: Wellington Hampshire, MD;  Location: [place] [facility];  Service: Cardiovascular;  Laterality: N/A;   LOWER EXTREMITY ANGIOGRAPHY Left 12/10/2020   Procedure: Lower Extremity Angiography;  Surgeon: Algernon Huxley, MD;  Location: [place] [facility];  Service: Cardiovascular;  Laterality: Left;   LOWER EXTREMITY ANGIOGRAPHY Right 12/25/2020   Procedure: LOWER EXTREMITY ANGIOGRAPHY;  Surgeon: Algernon Huxley, MD;  Location: [place] [facility];  Service: Cardiovascular;  Laterality: Right;    Family History  Problem Relation Age of Onset   Hypertension Father     No Known Allergies  CBC Latest Ref Rng & Units 12/16/2020 12/15/2020 12/14/2020  WBC 4.0 - 10.5 K/uL 3.6(L) 3.8(L) 3.6(L)  Hemoglobin 12.0 - 15.0 g/dL 10.0(L) 9.9(L) 10.1(L)   Hematocrit 36.0 - 46.0 % 30.5(L) 30.6(L) 30.6(L)  Platelets 150 - 400 K/uL 184 155 137(L)      CMP     Component Value Date/Time   NA 136 12/16/2020 0354   K 4.2 12/16/2020 0354   CL 103 12/16/2020 0354   CO2 29 12/16/2020 0354   GLUCOSE 290 (H) 12/16/2020 0354   BUN 14 12/25/2020 1005   CREATININE <0.30 (L) 12/25/2020 1005   CALCIUM 8.5 (L) 12/16/2020 0354   PROT 7.4 12/09/2020 1501   ALBUMIN 3.9 12/09/2020 1501   AST 15 12/09/2020 1501   ALT 12 12/09/2020 1501   ALKPHOS 59 12/09/2020 1501   BILITOT 2.2 (H) 12/09/2020 1501   GFRNONAA NOT CALCULATED 12/25/2020 1005     VAS [place] ABI WITH/WO TBI  Result Date: 01/23/2021  LOWER EXTREMITY DOPPLER STUDY Patient Name:  Beth Carroll  Date of Exam:   01/21/2021 Medical Rec #: [id number]      Accession #:    [id number] Date of Birth: February 05, 1970      Patient Gender: F Patient Age:   85 years Exam Location:  [facility] Procedure:      VAS [place] ABI WITH/WO TBI Referring Phys: --------------------------------------------------------------------------------   Vascular Interventions: 12/10/2020 lt sfa pop thrombectomy and stents                         12/25/2020 rt t sfa pop thrombectomy and stents. Performing Technologist: Concha Norway RVT  Examination Guidelines: A complete evaluation includes at minimum, Doppler waveform signals and systolic blood pressure reading at the level of bilateral brachial, anterior tibial, and posterior tibial arteries, when vessel segments are accessible. Bilateral testing is considered an integral part of a complete examination. Photoelectric Plethysmograph (PPG) waveforms and toe systolic pressure readings are included as required and additional duplex testing as needed. Limited examinations for reoccurring indications may be performed as noted.  ABI Findings: +---------+------------------+-----+---------+--------+  Right     Rt Pressure (mmHg) Index Waveform  Comment    +---------+------------------+-----+---------+--------+  Brachial  187                                          +---------+------------------+-----+---------+--------+  ATA       195                      triphasic 1.04      +---------+------------------+-----+---------+--------+  PTA       193                1.03  triphasic           +---------+------------------+-----+---------+--------+  Great Toe 190                1.02  Normal              +---------+------------------+-----+---------+--------+ +---------+------------------+-----+---------+-------+  Left      Lt Pressure (mmHg) Index Waveform  Comment  +---------+------------------+-----+---------+-------+  Brachial  185                                         +---------+------------------+-----+---------+-------+  ATA       200                      triphasic 1.07     +---------+------------------+-----+---------+-------+  PTA       196                1.05  triphasic          +---------+------------------+-----+---------+-------+  Great Toe 159                0.85  Normal             +---------+------------------+-----+---------+-------+  Summary: Right: Resting right ankle-brachial index is within normal range. No evidence of significant right lower extremity arterial disease. The right toe-brachial index is normal. Left: Resting left ankle-brachial index is within normal range. No evidence of significant left lower extremity arterial disease. The left toe-brachial index is normal.  *See table(s) above for measurements and observations.  Electronically signed by Leotis Pain MD on 01/23/2021 at 10:02:42 AM.    Final        Assessment & Plan:   1. 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  The patient reports that the claudication symptoms and leg pain is essentially gone.   The patient denies lifestyle limiting changes at this point in time.  No  further invasive studies, angiography or surgery at this time The patient should continue walking and begin a more formal exercise program.  The patient should continue antiplatelet therapy and aggressive treatment of the lipid abnormalities  Smoking cessation was again discussed  The patient should continue wearing graduated compression socks 10-15 mmHg strength to control the mild edema.  Patient should undergo noninvasive studies as ordered. The patient will follow up with me after the studies.    2. Diabetes mellitus without complication ([place]) Continue hypoglycemic medications as already ordered, these medications have been reviewed and there are no changes at this time.  Hgb A1C to be monitored as already arranged by primary service   3. Primary hypertension Continue antihypertensive medications as already ordered, these medications have been reviewed and there are no changes at this time.    Current Outpatient Medications on File Prior to Visit  Medication Sig Dispense Refill   albuterol (VENTOLIN HFA) 108 (90 Base) MCG/ACT inhaler Inhale 2 puffs into the lungs every 6 (six) hours as needed.     aspirin EC 81 MG EC tablet Take 1 tablet (81 mg total) by mouth daily. Swallow whole. 30 tablet 2   atorvastatin (LIPITOR) 20 MG tablet Take 1 tablet (20 mg total) by mouth daily. 30 tablet 0   carvedilol (COREG) 3.125 MG tablet Take 1 tablet (3.125 mg total) by mouth 2 (two) times daily with a  meal. 60 tablet 0   clopidogrel (PLAVIX) 75 MG tablet Take 1 tablet (75 mg total) by mouth daily. 30 tablet 1   furosemide (LASIX) 20 MG tablet Take 20 mg by mouth daily.     insulin glargine (LANTUS SOLOSTAR) 100 UNIT/ML Solostar Pen Inject 25 Units into the skin daily.     pantoprazole (PROTONIX) 40 MG tablet Take 1 tablet (40 mg total) by mouth daily. 30 tablet 0   Vitamin D, Ergocalciferol, (DRISDOL) 1.25 MG (50000 UNIT) CAPS capsule Take 50,000 Units by mouth once a week.      HYDROcodone-acetaminophen (NORCO/VICODIN) 5-325 MG tablet Take 1 tablet by mouth every 6 (six) hours as needed for severe pain. (Patient not taking: Reported on 12/25/2020) 15 tablet 0   No current facility-administered medications on file prior to visit.    There are no Patient Instructions on file for this visit. No follow-ups on file.   Kris Hartmann, NP

## 2021-01-28 NOTE — Progress Notes (Signed)
Incomplete Session Note  Patient Details  Name: Beth Carroll MRN: [id number] Date of Birth: 1970-06-02 Referring Provider:   Flowsheet Row Cardiac Rehab from 01/22/2021 in [facility]  Referring Provider Harrel Lemon MD       Encounter Date: 01/28/2021  Check In:  Session Check In - 01/28/21 1331       Check-In   Supervising physician immediately available to respond to emergencies See telemetry face sheet for immediately available ER MD    Location [facility]-Cardiac & Pulmonary Rehab    Staff Present Birdie Sons, MPA, RN;Joseph Lacey, Sharren Bridge, MS, ASCM CEP, Exercise Physiologist    Virtual Visit No    Medication changes reported     No    Fall or balance concerns reported    No    Tobacco Cessation No Change    Warm-up and Cool-down Not performed (comment)   BG 352   Resistance Training Performed No    VAD Patient? No    PAD/SET Patient? No      Pain Assessment   Currently in Pain? No/denies                Social History   Tobacco Use  Smoking Status Former   Types: Cigarettes   Quit date: 12/09/2020   Years since quitting: 0.1  Smokeless Tobacco Never    Patient unable to exercise today due to high BG level (352). Patient educated about medication administration, diet, and checking blood sugar prior coming to rehab.      Dr. Emily Filbert is Medical Director for [facility].  Dr. Ottie Glazier is Medical Director for [facility].

## 2021-02-01 NOTE — Progress Notes (Signed)
Cardiology Office Note:    Date:  02/02/2021   ID:  Beth Carroll, DOB 1970/04/10, MRN [id number]  PCP:  Baxter Hire, MD  [facility] Cardiologist:  None  [facility] Electrophysiologist:  None   Referring MD: Baxter Hire, MD   Chief Complaint: hospital follow-up   History of Present Illness:    Beth Carroll is a 51 y.o. female with a hx of of DM2, HTN, and lower extremity swelling and ongoing tobacco use who is being seen today for hospital follow-up.   Admitted 12/09/20 for dizziness, nausea and poor appetite. Upon arrival to the ED she was hypertensive with BP in the 222L systolic, hypokalemic with a potassium of 2.4, BNP 806, and high-sensitivity troponin 28 with a delta of 27.  EKG showed a sinus rhythm with possible prior anterior infarct.  Chest x-ray showed no acute findings.  ABIs performed in the ED were notable for 0.4 on the right and 0.3 on the left with very poor waveforms.  She was started on a heparin drip and vascular surgery was consulted.  On 11/16 and she underwent left lower extremity intervention as outlined in the operative note.  Plans are to intervene on the right lower extremity, with timing uncertain at this time.  Echo showed an EF of 40-45%, apical and periapical hypokinesis with wall motion best preserved along the basal/mid wall, Gr2DD, normal RV systolic function and ventricular cavity size, estimated right atrial pressure 15 mmHg, and no significant valvular abnormalities.  Due to abnormal echo, cardiology was consulted.  She underwent cardiac cath for CM that showed subttal occlusion of the mLAD which was treated with DES to proximal ramus and LAD. She was started on DAPT with ASA and Plavix.   Today, the patient reports she has been doing well since being home. Has some chest soreness and right wrist soreness. She uses an automatic car and is using her hand more. She is using Tylenol. No exertional chest pain. Has some SOB on exertion, she has an  inhaler for lungs by PCP. Cath site on the right wrist is stable. Some swelling on her feet. She is taking lasix daily. PCP started her back on lisinopril. She is not taking HCTZ. She is following low salt diet. Some lower leg edema on exam. Does not have BP machine at home. BP soft today. She feels some dizziness and lightheadedness. Also has some nasuea. No bleeding issues on DAPT. She is starting cardiac rehab. She quit smoking the day she went to the ER. She will continue to follow with [facility] for PAD.   Past Medical History:  Diagnosis Date   Diabetes mellitus without complication ([place])    Hypertension     Past Surgical History:  Procedure Laterality Date   CORONARY STENT INTERVENTION N/A 12/15/2020   Procedure: CORONARY STENT INTERVENTION;  Surgeon: Wellington Hampshire, MD;  Location: [place] [facility];  Service: Cardiovascular;  Laterality: N/A;   LEFT HEART CATH AND CORONARY ANGIOGRAPHY N/A 12/15/2020   Procedure: LEFT HEART CATH AND CORONARY ANGIOGRAPHY;  Surgeon: Wellington Hampshire, MD;  Location: [place] [facility];  Service: Cardiovascular;  Laterality: N/A;   LOWER EXTREMITY ANGIOGRAPHY Left 12/10/2020   Procedure: Lower Extremity Angiography;  Surgeon: Algernon Huxley, MD;  Location: [place] [facility];  Service: Cardiovascular;  Laterality: Left;   LOWER EXTREMITY ANGIOGRAPHY Right 12/25/2020   Procedure: LOWER EXTREMITY ANGIOGRAPHY;  Surgeon: Algernon Huxley, MD;  Location: [place] [facility];  Service: Cardiovascular;  Laterality: Right;    Current Medications: Current Meds  Medication Sig   albuterol (VENTOLIN HFA) 108 (90 Base) MCG/ACT inhaler Inhale 2 puffs into the lungs every 6 (six) hours as needed.   aspirin EC 81 MG EC tablet Take 1 tablet (81 mg total) by mouth daily. Swallow whole.   atorvastatin (LIPITOR) 20 MG tablet Take 1 tablet (20 mg total) by mouth daily.   carvedilol (COREG) 3.125 MG tablet Take 1 tablet (3.125 mg total) by mouth 2 (two) times daily with a  meal.   clopidogrel (PLAVIX) 75 MG tablet Take 1 tablet (75 mg total) by mouth daily.   furosemide (LASIX) 20 MG tablet Take 20 mg by mouth daily.   hydrochlorothiazide (HYDRODIURIL) 25 MG tablet Take 25 mg by mouth daily.   insulin aspart (NOVOLOG) 100 UNIT/ML FlexPen Inject into the skin.   insulin glargine (LANTUS SOLOSTAR) 100 UNIT/ML Solostar Pen Inject 25 Units into the skin daily.   pantoprazole (PROTONIX) 40 MG tablet Take 1 tablet (40 mg total) by mouth daily.   Vitamin D, Ergocalciferol, (DRISDOL) 1.25 MG (50000 UNIT) CAPS capsule Take 50,000 Units by mouth once a week.   [DISCONTINUED] lisinopril (ZESTRIL) 40 MG tablet Take 40 mg by mouth daily.     Allergies:   Patient has no known allergies.   Social History   Socioeconomic History   Marital status: Single    Spouse name: Not on file   Number of children: Not on file   Years of education: Not on file   Highest education level: Not on file  Occupational History   Not on file  Tobacco Use   Smoking status: Former    Types: Cigarettes    Quit date: 12/09/2020    Years since quitting: 0.1   Smokeless tobacco: Never  Substance and Sexual Activity   Alcohol use: Never   Drug use: Not on file   Sexual activity: Not on file  Other Topics Concern   Not on file  Social History Narrative   Not on file   Social Determinants of Health   Financial Resource Strain: Not on file  Food Insecurity: Not on file  Transportation Needs: Not on file  Physical Activity: Not on file  Stress: Not on file  Social Connections: Not on file     Family History: The patient's family history includes Hypertension in her father.  ROS:   Please see the history of present illness.     All other systems reviewed and are negative.  EKGs/Labs/Other Studies Reviewed:    The following studies were reviewed today:  Cardiac cath 12/15/20   Mid LAD lesion is 99% stenosed.   Ramus lesion is 90% stenosed.   Mid LAD to Dist LAD lesion is  40% stenosed.   A drug-eluting stent was successfully placed using a STENT ONYX FRONTIER 2.25X18.   A drug-eluting stent was successfully placed using a STENT ONYX FRONTIER 2.75X22.   Post intervention, there is a 0% residual stenosis.   Post intervention, there is a 0% residual stenosis.   The left ventricular systolic function is normal.   LV end diastolic pressure is normal.   The left ventricular ejection fraction is 35-45% by visual estimate.   1.  Severe two-vessel coronary artery disease with subtotal occlusion of the mid LAD as well as significant stenosis in proximal ramus.  The LAD is diffusely diseased and small in size in the mid to distal segment. 2.  Moderately reduced LV systolic function with  akinesis of mid to distal anterior and apical myocardium.  Normal left ventricular end-diastolic pressure. 3.  Successful angioplasty and drug-eluting stent placement to the mid LAD as well as proximal ramus.   Recommendations: Dual antiplatelet therapy for at least 6 months. Aggressive treatment of risk factors.  Coronary Diagrams  Diagnostic Dominance: Right Intervention     Echo 11/2018 1. Left ventricular ejection fraction, by estimation, is 40 to 45%. The  left ventricle has mildly decreased function. The left ventricle  demonstrates regional wall motion abnormalities (Apical and periapical  hypokinesis, basal and mid wall motion best  preserved, consistent with possible stress cardiomyopathy though unable to  exclude ishcemia). Left ventricular diastolic parameters are consistent  with Grade II diastolic dysfunction (pseudonormalization).   2. Right ventricular systolic function is normal. The right ventricular  size is normal. Tricuspid regurgitation signal is inadequate for assessing  PA pressure.   3. The mitral valve is normal in structure. No evidence of mitral valve  regurgitation. No evidence of mitral stenosis.   4. The aortic valve is normal in structure.  Aortic valve regurgitation is  not visualized. No aortic stenosis is present.   5. The inferior vena cava is dilated in size with <50% respiratory  variability, suggesting right atrial pressure of 15 mmHg.   EKG:  EKG is  ordered today.  The ekg ordered today demonstrates NSR, 81bpm, LAD, septal q waves, TW changes inferior leads  Recent Labs: 12/09/2020: ALT 12; B Natriuretic Peptide 106.3 12/10/2020: Magnesium 2.0 12/16/2020: Hemoglobin 10.0; Platelets 184; Potassium 4.2; Sodium 136 12/25/2020: BUN 14; Creatinine, Ser <0.30  Recent Lipid Panel    Component Value Date/Time   CHOL 120 12/12/2020 0532   TRIG 59 12/12/2020 0532   HDL 45 12/12/2020 0532   CHOLHDL 2.7 12/12/2020 0532   VLDL 12 12/12/2020 0532   LDLCALC 63 12/12/2020 0532    Physical Exam:    VS:  BP 96/66 (BP Location: Left Arm, Patient Position: Sitting, Cuff Size: Normal)    Pulse 81    Ht 5' 4.25" (1.632 m)    Wt 117 lb (53.1 kg)    BMI 19.93 kg/m     Wt Readings from Last 3 Encounters:  02/02/21 117 lb (53.1 kg)  01/22/21 107 lb 14.4 oz (48.9 kg)  01/21/21 110 lb 9.6 oz (50.2 kg)     GEN:  Well nourished, well developed in no acute distress HEENT: Normal NECK: No JVD; No carotid bruits LYMPHATICS: No lymphadenopathy CARDIAC: RRR, no murmurs, rubs, gallops RESPIRATORY:  diffusely diminished ABDOMEN: Soft, non-tender, non-distended MUSCULOSKELETAL:  trace edema; No deformity  SKIN: Warm and dry NEUROLOGIC:  Alert and oriented x 3 PSYCHIATRIC:  Normal affect   ASSESSMENT:    1. Coronary artery disease involving native coronary artery of native heart without angina pectoris   2. Acute systolic heart failure ([place])   3. Chronic systolic heart failure ([place])   4. Ischemic cardiomyopathy   5. Hyperlipidemia, mixed   6. Hypotension, unspecified hypotension type    PLAN:    In order of problems listed above:  CAD s/p PCI to LAD and Ramus Patient is overall doing well since discharge. Cath site is  stable, has some wrist soreness as she increases hand movement. Also has some chest soreness with palpitation. No exertional chest pain. Has some SOB from smoking history, on an inhaler. She is starting cardiac rehab. She can slowly get back to work over the next month, will give a letter. No bleeding issues  on DAPT. Continue DAPT with ASA and Plavix for at least 6 months. Continue statin and BB.   Orthostatic hypotension Orthostatics positive today. Stop lisinopril. Discussed good hydration, thigh high socks, and abdominal binder. If she is still orthostatic may need to stop Coreg/lasix. Can reevluate at follow-up.   HFrEF ICM Echo in the hospital showed EF 40-45%/ She is on Coreg 3.125mg  BID. Has trace edema on exam with lasix 20mg  daily. BP is low and she is orthostatic, will not make medication changes today. Encouraged low salt diet, leg elevation. Stop Acei as above for orthostatic hypotension. If BP improves at follow-up can consider addition of GDMT. Can repeat echo in 2-3 months to re-evaluate EF.  HLD LDL 63. Continue statin.   DM2 A1C 13.7. She is following with PCP  PAD/critical ischemia She will continue to follow with [facility]  Disposition: Follow up in 1 year(s) with MD/APP     Signed, Leenah Seidner Ninfa Meeker, PA-C  02/02/2021 1:04 PM    [place]

## 2021-02-02 ENCOUNTER — Other Ambulatory Visit: Payer: Self-pay

## 2021-02-02 ENCOUNTER — Encounter: Payer: Self-pay | Admitting: Medical

## 2021-02-02 ENCOUNTER — Encounter: Payer: Self-pay | Admitting: Emergency Medicine

## 2021-02-02 ENCOUNTER — Ambulatory Visit (INDEPENDENT_AMBULATORY_CARE_PROVIDER_SITE_OTHER): Payer: No Typology Code available for payment source | Admitting: Medical

## 2021-02-02 VITALS — BP 96/66 | HR 81 | Ht 64.25 in | Wt 117.0 lb

## 2021-02-02 DIAGNOSIS — I5021 Acute systolic (congestive) heart failure: Secondary | ICD-10-CM

## 2021-02-02 DIAGNOSIS — Z48812 Encounter for surgical aftercare following surgery on the circulatory system: Secondary | ICD-10-CM | POA: Diagnosis not present

## 2021-02-02 DIAGNOSIS — I2510 Atherosclerotic heart disease of native coronary artery without angina pectoris: Secondary | ICD-10-CM

## 2021-02-02 DIAGNOSIS — I5022 Chronic systolic (congestive) heart failure: Secondary | ICD-10-CM | POA: Diagnosis not present

## 2021-02-02 DIAGNOSIS — Z955 Presence of coronary angioplasty implant and graft: Secondary | ICD-10-CM

## 2021-02-02 DIAGNOSIS — I959 Hypotension, unspecified: Secondary | ICD-10-CM

## 2021-02-02 DIAGNOSIS — I255 Ischemic cardiomyopathy: Secondary | ICD-10-CM | POA: Diagnosis not present

## 2021-02-02 DIAGNOSIS — E782 Mixed hyperlipidemia: Secondary | ICD-10-CM

## 2021-02-02 DIAGNOSIS — I214 Non-ST elevation (NSTEMI) myocardial infarction: Secondary | ICD-10-CM

## 2021-02-02 LAB — GLUCOSE, CAPILLARY
Glucose-Capillary: 200 mg/dL — ABNORMAL HIGH (ref 70–99)
Glucose-Capillary: 162 mg/dL — ABNORMAL HIGH (ref 70–99)

## 2021-02-02 NOTE — Progress Notes (Signed)
Work restrictions for patient

## 2021-02-02 NOTE — Progress Notes (Signed)
Daily Session Note  Patient Details  Name: Beth Carroll MRN: [id number] Date of Birth: [birth year] Referring Provider:   Flowsheet Row Cardiac Rehab from 01/22/2021 in [facility] Cardiac and Pulmonary Rehab  Referring Provider Harrel Lemon MD       Encounter Date: 02/02/2021  Check In:  Session Check In - 02/02/21 1410       Check-In   Supervising physician immediately available to respond to emergencies See telemetry face sheet for immediately available ER MD    Location [facility]-Cardiac & Pulmonary Rehab    Staff Present Birdie Sons, MPA, RN;Joseph Lou Miner, MS, ASCM CEP, Exercise Physiologist    Virtual Visit No    Medication changes reported     Yes    Comments added novolog with meals    Fall or balance concerns reported    No    Tobacco Cessation No Change    Warm-up and Cool-down Performed on first and last piece of equipment    Resistance Training Performed Yes    VAD Patient? No    PAD/SET Patient? No      Pain Assessment   Currently in Pain? No/denies                Social History   Tobacco Use  Smoking Status Former   Types: Cigarettes   Quit date: 12/09/2020   Years since quitting: 0.1  Smokeless Tobacco Never    Goals Met:  Independence with exercise equipment Exercise tolerated well No report of concerns or symptoms today Strength training completed today  Goals Unmet:  Not Applicable  Comments: Pt able to follow exercise prescription today without complaint.  Will continue to monitor for progression.    Dr. Emily Filbert is Medical Director for [facility].  Dr. Ottie Glazier is Medical Director for [facility] Pulmonary Rehabilitation.

## 2021-02-02 NOTE — Patient Instructions (Signed)
Medication Instructions:  Your physician has recommended you make the following change in your medication:   STOP taking Zestril (lisinopril)   *If you need a refill on your cardiac medications before your next appointment, please call your pharmacy*   Lab Work: None ordered  If you have labs (blood work) drawn today and your tests are completely normal, you will receive your results only by: [place] (if you have MyChart) OR A paper copy in the mail If you have any lab test that is abnormal or we need to change your treatment, we will call you to review the results.   Testing/Procedures: None ordered   Follow-Up: At [facility], you and your health needs are our priority.  As part of our continuing mission to provide you with exceptional heart care, we have created designated Provider Care Teams.  These Care Teams include your primary Cardiologist (physician) and Advanced Practice Providers (APPs -  Physician Assistants and Nurse Practitioners) who all work together to provide you with the care you need, when you need it.  We recommend signing up for the patient portal called "MyChart".  Sign up information is provided on this After Visit Summary.  MyChart is used to connect with patients for Virtual Visits (Telemedicine).  Patients are able to view lab/test results, encounter notes, upcoming appointments, etc.  Non-urgent messages can be sent to your provider as well.   To learn more about what you can do with MyChart, go to [email or web address].    Your next appointment:   1 month(s)  The format for your next appointment:   In Person  Provider:   You may see Cadence Kathlen Mody, PA or one of the following Advanced Practice Providers on your designated Care Team:   Murray Hodgkins, NP Christell Faith, PA-C   :1}    Other Instructions: N/A

## 2021-02-02 NOTE — Progress Notes (Signed)
Completed initial RD consultation ?

## 2021-02-05 ENCOUNTER — Encounter: Payer: No Typology Code available for payment source | Admitting: *Deleted

## 2021-02-05 ENCOUNTER — Other Ambulatory Visit: Payer: Self-pay

## 2021-02-05 DIAGNOSIS — I214 Non-ST elevation (NSTEMI) myocardial infarction: Secondary | ICD-10-CM

## 2021-02-05 DIAGNOSIS — Z955 Presence of coronary angioplasty implant and graft: Secondary | ICD-10-CM

## 2021-02-05 DIAGNOSIS — Z48812 Encounter for surgical aftercare following surgery on the circulatory system: Secondary | ICD-10-CM | POA: Diagnosis not present

## 2021-02-05 NOTE — Progress Notes (Signed)
Daily Session Note  Patient Details  Name: Beth Carroll MRN: [id number] Date of Birth: January 02, 1971 Referring Provider:   Flowsheet Row Cardiac Rehab from 01/22/2021 in [facility] Cardiac and Pulmonary Rehab  Referring Provider Harrel Lemon MD       Encounter Date: 02/05/2021  Check In:  Session Check In - 02/05/21 1408       Check-In   Supervising physician immediately available to respond to emergencies See telemetry face sheet for immediately available ER MD    Location [facility]-Cardiac & Pulmonary Rehab    Staff Present Renita Papa, RN BSN;Joseph Kreamer, RCP,RRT,BSRT;Jessica [place], [place], RCEP, CCRP, CCET    Virtual Visit No    Medication changes reported     No    Fall or balance concerns reported    No    Warm-up and Cool-down Performed on first and last piece of equipment    Resistance Training Performed Yes    VAD Patient? No    PAD/SET Patient? No      Pain Assessment   Currently in Pain? No/denies                Social History   Tobacco Use  Smoking Status Former   Types: Cigarettes   Quit date: 12/09/2020   Years since quitting: 0.1  Smokeless Tobacco Never    Goals Met:  Independence with exercise equipment Exercise tolerated well No report of concerns or symptoms today Strength training completed today  Goals Unmet:  Not Applicable  Comments: Pt able to follow exercise prescription today without complaint.  Will continue to monitor for progression.    Dr. Emily Filbert is Medical Director for [facility].  Dr. Ottie Glazier is Medical Director for [facility] Pulmonary Rehabilitation.

## 2021-02-11 ENCOUNTER — Other Ambulatory Visit: Payer: Self-pay

## 2021-02-11 DIAGNOSIS — Z48812 Encounter for surgical aftercare following surgery on the circulatory system: Secondary | ICD-10-CM | POA: Diagnosis not present

## 2021-02-11 DIAGNOSIS — I214 Non-ST elevation (NSTEMI) myocardial infarction: Secondary | ICD-10-CM

## 2021-02-11 DIAGNOSIS — Z955 Presence of coronary angioplasty implant and graft: Secondary | ICD-10-CM

## 2021-02-11 LAB — GLUCOSE, CAPILLARY
Glucose-Capillary: 159 mg/dL — ABNORMAL HIGH (ref 70–99)
Glucose-Capillary: 142 mg/dL — ABNORMAL HIGH (ref 70–99)

## 2021-02-11 NOTE — Progress Notes (Signed)
Daily Session Note  Patient Details  Name: Beth Carroll MRN: [id number] Date of Birth: 03/11/1970 Referring Provider:   Flowsheet Row Cardiac Rehab from 01/22/2021 in [facility] Cardiac and Pulmonary Rehab  Referring Provider Harrel Lemon MD       Encounter Date: 02/11/2021  Check In:  Session Check In - 02/11/21 1344       Check-In   Supervising physician immediately available to respond to emergencies See telemetry face sheet for immediately available ER MD    Location [facility]-Cardiac & Pulmonary Rehab    Staff Present Birdie Sons, MPA, Nino Glow, MS, ASCM CEP, Exercise Physiologist;Joseph Tessie Fass, [place]    Virtual Visit No    Medication changes reported     No    Fall or balance concerns reported    No    Tobacco Cessation No Change    Warm-up and Cool-down Performed on first and last piece of equipment    Resistance Training Performed Yes    VAD Patient? No    PAD/SET Patient? No      Pain Assessment   Currently in Pain? No/denies                Social History   Tobacco Use  Smoking Status Former   Types: Cigarettes   Quit date: 12/09/2020   Years since quitting: 0.1  Smokeless Tobacco Never    Goals Met:  Independence with exercise equipment Exercise tolerated well No report of concerns or symptoms today Strength training completed today  Goals Unmet:  Not Applicable  Comments: Pt able to follow exercise prescription today without complaint.  Will continue to monitor for progression.    Dr. Emily Filbert is Medical Director for [place].  Dr. Ottie Glazier is Medical Director for [facility] Pulmonary Rehabilitation.

## 2021-02-12 ENCOUNTER — Other Ambulatory Visit: Payer: Self-pay

## 2021-02-12 ENCOUNTER — Encounter: Payer: No Typology Code available for payment source | Admitting: *Deleted

## 2021-02-12 DIAGNOSIS — Z955 Presence of coronary angioplasty implant and graft: Secondary | ICD-10-CM

## 2021-02-12 DIAGNOSIS — Z48812 Encounter for surgical aftercare following surgery on the circulatory system: Secondary | ICD-10-CM | POA: Diagnosis not present

## 2021-02-12 DIAGNOSIS — I214 Non-ST elevation (NSTEMI) myocardial infarction: Secondary | ICD-10-CM

## 2021-02-12 NOTE — Progress Notes (Signed)
Daily Session Note  Patient Details  Name: Beth Carroll MRN: [id number] Date of Birth: [birth year] Referring Provider:   Flowsheet Row Cardiac Rehab from 01/22/2021 in [facility] Cardiac and Pulmonary Rehab  Referring Provider Harrel Lemon MD       Encounter Date: 02/12/2021  Check In:  Session Check In - 02/12/21 1355       Check-In   Supervising physician immediately available to respond to emergencies See telemetry face sheet for immediately available ER MD    Location [facility]-Cardiac & Pulmonary Rehab    Staff Present Renita Papa, RN BSN;Joseph Brandon, RCP,RRT,BSRT;Jessica [place], [place], RCEP, CCRP, CCET    Virtual Visit No    Medication changes reported     No    Fall or balance concerns reported    No    Warm-up and Cool-down Performed on first and last piece of equipment    Resistance Training Performed Yes    VAD Patient? No    PAD/SET Patient? No      Pain Assessment   Currently in Pain? No/denies                Social History   Tobacco Use  Smoking Status Former   Types: Cigarettes   Quit date: 12/09/2020   Years since quitting: 0.1  Smokeless Tobacco Never    Goals Met:  Independence with exercise equipment Exercise tolerated well No report of concerns or symptoms today Strength training completed today  Goals Unmet:  Not Applicable  Comments: Pt able to follow exercise prescription today without complaint.  Will continue to monitor for progression.    Dr. Emily Filbert is Medical Director for [place].  Dr. Ottie Glazier is Medical Director for [facility] Pulmonary Rehabilitation.

## 2021-02-16 ENCOUNTER — Other Ambulatory Visit: Payer: Self-pay

## 2021-02-16 DIAGNOSIS — I214 Non-ST elevation (NSTEMI) myocardial infarction: Secondary | ICD-10-CM

## 2021-02-16 DIAGNOSIS — Z48812 Encounter for surgical aftercare following surgery on the circulatory system: Secondary | ICD-10-CM | POA: Diagnosis not present

## 2021-02-16 DIAGNOSIS — Z955 Presence of coronary angioplasty implant and graft: Secondary | ICD-10-CM

## 2021-02-16 LAB — GLUCOSE, CAPILLARY: Glucose-Capillary: 261 mg/dL — ABNORMAL HIGH (ref 70–99)

## 2021-02-16 NOTE — Progress Notes (Signed)
Daily Session Note  Patient Details  Name: Beth Carroll MRN: [id number] Date of Birth: May 27, 1970 Referring Provider:   Flowsheet Row Cardiac Rehab from 01/22/2021 in [facility] Cardiac and Pulmonary Rehab  Referring Provider Harrel Lemon MD       Encounter Date: 02/16/2021  Check In:  Session Check In - 02/16/21 1402       Check-In   Supervising physician immediately available to respond to emergencies See telemetry face sheet for immediately available ER MD    Location [facility]-Cardiac & Pulmonary Rehab    Staff Present Birdie Sons, MPA, Nino Glow, MS, ASCM CEP, Exercise Physiologist;Joseph Tessie Fass, [place]    Virtual Visit No    Medication changes reported     No    Fall or balance concerns reported    No    Tobacco Cessation No Change    Warm-up and Cool-down Performed on first and last piece of equipment    Resistance Training Performed Yes    VAD Patient? No    PAD/SET Patient? No      Pain Assessment   Currently in Pain? No/denies                Social History   Tobacco Use  Smoking Status Former   Types: Cigarettes   Quit date: 12/09/2020   Years since quitting: 0.1  Smokeless Tobacco Never    Goals Met:  Independence with exercise equipment Exercise tolerated well No report of concerns or symptoms today Strength training completed today  Goals Unmet:  Not Applicable  Comments: Pt able to follow exercise prescription today without complaint.  Will continue to monitor for progression.    Dr. Emily Filbert is Medical Director for [facility].  Dr. Ottie Glazier is Medical Director for [facility] Pulmonary Rehabilitation.

## 2021-02-18 ENCOUNTER — Encounter: Payer: Self-pay | Admitting: *Deleted

## 2021-02-18 DIAGNOSIS — Z955 Presence of coronary angioplasty implant and graft: Secondary | ICD-10-CM

## 2021-02-18 DIAGNOSIS — I214 Non-ST elevation (NSTEMI) myocardial infarction: Secondary | ICD-10-CM

## 2021-02-18 NOTE — Progress Notes (Signed)
Cardiac Individual Treatment Plan  Patient Details  Name: Misaki Sozio MRN: [id number] Date of Birth: 1970/04/14 Referring Provider:   Flowsheet Row Cardiac Rehab from 01/22/2021 in [facility] Cardiac and Pulmonary Rehab  Referring Provider Harrel Lemon MD       Initial Encounter Date:  Flowsheet Row Cardiac Rehab from 01/22/2021 in [facility] Cardiac and Pulmonary Rehab  Date 01/22/21       Visit Diagnosis: NSTEMI (non-ST elevated myocardial infarction) [facility])  Status post coronary artery stent placement  Patient's Home Medications on Admission:  Current Outpatient Medications:    albuterol (VENTOLIN HFA) 108 (90 Base) MCG/ACT inhaler, Inhale 2 puffs into the lungs every 6 (six) hours as needed., Disp: , Rfl:    aspirin EC 81 MG EC tablet, Take 1 tablet (81 mg total) by mouth daily. Swallow whole., Disp: 30 tablet, Rfl: 2   atorvastatin (LIPITOR) 20 MG tablet, Take 1 tablet (20 mg total) by mouth daily., Disp: 30 tablet, Rfl: 0   carvedilol (COREG) 3.125 MG tablet, Take 1 tablet (3.125 mg total) by mouth 2 (two) times daily with a meal., Disp: 60 tablet, Rfl: 0   clopidogrel (PLAVIX) 75 MG tablet, Take 1 tablet (75 mg total) by mouth daily., Disp: 30 tablet, Rfl: 1   furosemide (LASIX) 20 MG tablet, Take 20 mg by mouth daily., Disp: , Rfl:    hydrochlorothiazide (HYDRODIURIL) 25 MG tablet, Take 25 mg by mouth daily., Disp: , Rfl:    insulin aspart (NOVOLOG) 100 UNIT/ML FlexPen, Inject into the skin., Disp: , Rfl:    insulin glargine (LANTUS SOLOSTAR) 100 UNIT/ML Solostar Pen, Inject 25 Units into the skin daily., Disp: , Rfl:    pantoprazole (PROTONIX) 40 MG tablet, Take 1 tablet (40 mg total) by mouth daily., Disp: 30 tablet, Rfl: 0   Vitamin D, Ergocalciferol, (DRISDOL) 1.25 MG (50000 UNIT) CAPS capsule, Take 50,000 Units by mouth once a week., Disp: , Rfl:   Past Medical History: Past Medical History:  Diagnosis Date   Diabetes mellitus without complication ([place])    Hypertension      Tobacco Use: Social History   Tobacco Use  Smoking Status Former   Types: Cigarettes   Quit date: 12/09/2020   Years since quitting: 0.1  Smokeless Tobacco Never    Labs: Recent Review Flowsheet Data     Labs for ITP Cardiac and Pulmonary Rehab Latest Ref Rng & Units 12/10/2020 12/12/2020   Cholestrol 0 - 200 mg/dL - 120   LDLCALC 0 - 99 mg/dL - 63   HDL >40 mg/dL - 45   Trlycerides <150 mg/dL - 59   Hemoglobin A1c 4.8 - 5.6 % 13.7(H) -        Exercise Target Goals: Exercise Program Goal: Individual exercise prescription set using results from initial 6 min walk test and THRR while considering  patients activity barriers and safety.   Exercise Prescription Goal: Initial exercise prescription builds to 30-45 minutes a day of aerobic activity, 2-3 days per week.  Home exercise guidelines will be given to patient during program as part of exercise prescription that the participant will acknowledge.   Education: Aerobic Exercise: - Group verbal and visual presentation on the components of exercise prescription. Introduces F.I.T.T principle from ACSM for exercise prescriptions.  Reviews F.I.T.T. principles of aerobic exercise including progression. Written material given at graduation. Flowsheet Row Cardiac Rehab from 02/11/2021 in [facility] Cardiac and Pulmonary Rehab  Education need identified 01/22/21       Education: Resistance Exercise: - Group  verbal and visual presentation on the components of exercise prescription. Introduces F.I.T.T principle from ACSM for exercise prescriptions  Reviews F.I.T.T. principles of resistance exercise including progression. Written material given at graduation. Flowsheet Row Cardiac Rehab from 02/11/2021 in [facility] Cardiac and Pulmonary Rehab  Date 01/28/21  Educator [place]  Instruction Review Code 1- Verbalizes Understanding        Education: Exercise & Equipment Safety: - Individual verbal instruction and demonstration of equipment use  and safety with use of the equipment. Flowsheet Row Cardiac Rehab from 02/11/2021 in [facility] Cardiac and Pulmonary Rehab  Education need identified 01/22/21  Date 01/22/21  Educator [place]  Instruction Review Code 1- Verbalizes Understanding       Education: Exercise Physiology & General Exercise Guidelines: - Group verbal and written instruction with models to review the exercise physiology of the cardiovascular system and associated critical values. Provides general exercise guidelines with specific guidelines to those with heart or lung disease.    Education: Flexibility, Balance, Mind/Body Relaxation: - Group verbal and visual presentation with interactive activity on the components of exercise prescription. Introduces F.I.T.T principle from ACSM for exercise prescriptions. Reviews F.I.T.T. principles of flexibility and balance exercise training including progression. Also discusses the mind body connection.  Reviews various relaxation techniques to help reduce and manage stress (i.e. Deep breathing, progressive muscle relaxation, and visualization). Balance handout provided to take home. Written material given at graduation.   Activity Barriers & Risk Stratification:  Activity Barriers & Cardiac Risk Stratification - 01/22/21 1110       Activity Barriers & Cardiac Risk Stratification   Activity Barriers Other (comment);Deconditioning;Muscular [employer] Device    Comments PAD, recent stents in legs (left has some swelling still)    Cardiac Risk Stratification High             6 Minute Walk:  6 Minute Walk     Row Name 01/22/21 1116         6 Minute Walk   Phase Initial     Distance 580 feet     Walk Time 6 minutes     # of Rest Breaks 0     MPH 1.09     METS 2.88     RPE 11     Perceived Dyspnea  0     VO2 Peak 10.11     Symptoms Yes (comment)     Comments Left leg sore     Resting HR 80 bpm     Resting BP 100/64     Resting Oxygen Saturation  99 %      Exercise Oxygen Saturation  during 6 min walk 96 %     Max Ex. HR 88 bpm     Max Ex. BP 114/64     2 Minute Post BP 96/62              Oxygen Initial Assessment:   Oxygen Re-Evaluation:   Oxygen Discharge (Final Oxygen Re-Evaluation):   Initial Exercise Prescription:  Initial Exercise Prescription - 01/22/21 1100       Date of Initial Exercise RX and Referring Provider   Date 01/22/21    Referring Provider Harrel Lemon MD      Oxygen   Maintain Oxygen Saturation 88% or higher      Recumbant Bike   Level 1    RPM 60    Watts 15    Minutes 15    METs 2.8      NuStep   Level 1  SPM 80    Minutes 15    METs 2.8      Biostep-RELP   Level 1    SPM 50    Minutes 15    METs 2.8      Track   Laps 14    Minutes 15    METs 1.76      Prescription Details   Frequency (times per week) 3    Duration Progress to 30 minutes of continuous aerobic without signs/symptoms of physical distress      Intensity   THRR 40-80% of Max Heartrate 115-151    Ratings of Perceived Exertion 11-13    Perceived Dyspnea 0-4      Progression   Progression Continue to progress workloads to maintain intensity without signs/symptoms of physical distress.      Resistance Training   Training Prescription Yes    Weight 2 lb    Reps 10-15             Perform Capillary Blood Glucose checks as needed.  Exercise Prescription Changes:   Exercise Prescription Changes     Row Name 01/22/21 1100 02/09/21 1000           Response to Exercise   Blood Pressure (Admit) 100/64 122/62      Blood Pressure (Exercise) 114/64 124/60      Blood Pressure (Exit) 96/62 122/60      Heart Rate (Admit) 80 bpm 80 bpm      Heart Rate (Exercise) 88 bpm 83 bpm      Heart Rate (Exit) 78 bpm 76 bpm      Oxygen Saturation (Admit) 99 % --      Oxygen Saturation (Exercise) 96 % --      Rating of Perceived Exertion (Exercise) 11 13      Perceived Dyspnea (Exercise) 0 --      Symptoms Left leg  sore none      Comments walk test results 2nd full day of exercise      Duration -- Progress to 30 minutes of  aerobic without signs/symptoms of physical distress      Intensity -- THRR unchanged        Progression   Progression -- Continue to progress workloads to maintain intensity without signs/symptoms of physical distress.      Average METs -- 1.42        Resistance Training   Training Prescription -- Yes      Weight -- 2 lb      Reps -- 10-15        Interval Training   Interval Training -- No        NuStep   Level -- 2      Minutes -- 30      METs -- 1.7        Biostep-RELP   Level -- 1      Minutes -- 30      METs -- 1.5               Exercise Comments:   Exercise Comments     Row Name 01/28/21 1334 01/28/21 1340         Exercise Comments -- Patient unable to exercise today due to BG level being too high (352). Marcene Brawn, EP, provided education related to monitoring blood sugar and what is required to be allowed to exercise. Patient stated understanding.               Exercise Goals and  Review:   Exercise Goals     Row Name 01/22/21 1154             Exercise Goals   Increase Physical Activity Yes       Intervention Provide advice, education, support and counseling about physical activity/exercise needs.;Develop an individualized exercise prescription for aerobic and resistive training based on initial evaluation findings, risk stratification, comorbidities and participant's personal goals.       Expected Outcomes Short Term: Attend rehab on a regular basis to increase amount of physical activity.;Long Term: Add in home exercise to make exercise part of routine and to increase amount of physical activity.;Long Term: Exercising regularly at least 3-5 days a week.       Increase Strength and Stamina Yes       Intervention Provide advice, education, support and counseling about physical activity/exercise needs.;Develop an individualized exercise  prescription for aerobic and resistive training based on initial evaluation findings, risk stratification, comorbidities and participant's personal goals.       Expected Outcomes Short Term: Increase workloads from initial exercise prescription for resistance, speed, and METs.;Short Term: Perform resistance training exercises routinely during rehab and add in resistance training at home;Long Term: Improve cardiorespiratory fitness, muscular endurance and strength as measured by increased METs and functional capacity (6MWT)       Able to understand and use rate of perceived exertion (RPE) scale Yes       Intervention Provide education and explanation on how to use RPE scale       Expected Outcomes Short Term: Able to use RPE daily in rehab to express subjective intensity level;Long Term:  Able to use RPE to guide intensity level when exercising independently       Able to understand and use Dyspnea scale Yes       Intervention Provide education and explanation on how to use Dyspnea scale       Expected Outcomes Short Term: Able to use Dyspnea scale daily in rehab to express subjective sense of shortness of breath during exertion;Long Term: Able to use Dyspnea scale to guide intensity level when exercising independently       Knowledge and understanding of Target Heart Rate Range (THRR) Yes       Intervention Provide education and explanation of THRR including how the numbers were predicted and where they are located for reference       Expected Outcomes Short Term: Able to state/look up THRR;Long Term: Able to use THRR to govern intensity when exercising independently;Short Term: Able to use daily as guideline for intensity in rehab       Able to check pulse independently Yes       Intervention Provide education and demonstration on how to check pulse in carotid and radial arteries.;Review the importance of being able to check your own pulse for safety during independent exercise       Expected Outcomes  Short Term: Able to explain why pulse checking is important during independent exercise;Long Term: Able to check pulse independently and accurately       Understanding of Exercise Prescription Yes       Intervention Provide education, explanation, and written materials on patient's individual exercise prescription       Expected Outcomes Short Term: Able to explain program exercise prescription;Long Term: Able to explain home exercise prescription to exercise independently       Improve claudication pain toleration; Improve walking ability Yes       Intervention Attend education  sessions to aid in risk factor modification and understanding of disease process       Expected Outcomes Short Term: Improve walking distance/time to onset of claudication pain;Long Term: Improve walking ability and toleration to claudication                Exercise Goals Re-Evaluation :  Exercise Goals Re-Evaluation     Row Name 01/28/21 1334 02/09/21 1043           Exercise Goal Re-Evaluation   Exercise Goals Review -- Increase Physical Activity;Increase Strength and Stamina      Comments -- Lorrain has not been able to attend rehab consistently due to having higher blood sugars. She has been in contact with her doctor about it. She has only been able to exercise twice since starting and has tolerated each machine for the full 30 minutes so far. Will continue to monitor.      Expected Outcomes -- Short: Maintain regular blood sugars Long: Maintain good control of diabetes and exercise and improve overall strength               Discharge Exercise Prescription (Final Exercise Prescription Changes):  Exercise Prescription Changes - 02/09/21 1000       Response to Exercise   Blood Pressure (Admit) 122/62    Blood Pressure (Exercise) 124/60    Blood Pressure (Exit) 122/60    Heart Rate (Admit) 80 bpm    Heart Rate (Exercise) 83 bpm    Heart Rate (Exit) 76 bpm    Rating of Perceived Exertion (Exercise)  13    Symptoms none    Comments 2nd full day of exercise    Duration Progress to 30 minutes of  aerobic without signs/symptoms of physical distress    Intensity THRR unchanged      Progression   Progression Continue to progress workloads to maintain intensity without signs/symptoms of physical distress.    Average METs 1.42      Resistance Training   Training Prescription Yes    Weight 2 lb    Reps 10-15      Interval Training   Interval Training No      NuStep   Level 2    Minutes 30    METs 1.7      Biostep-RELP   Level 1    Minutes 30    METs 1.5             Nutrition:  Target Goals: Understanding of nutrition guidelines, daily intake of sodium 1500mg , cholesterol 200mg , calories 30% from fat and 7% or less from saturated fats, daily to have 5 or more servings of fruits and vegetables.  Education: All About Nutrition: -Group instruction provided by verbal, written material, interactive activities, discussions, models, and posters to present general guidelines for heart healthy nutrition including fat, fiber, MyPlate, the role of sodium in heart healthy nutrition, utilization of the nutrition label, and utilization of this knowledge for meal planning. Follow up email sent as well. Written material given at graduation. Flowsheet Row Cardiac Rehab from 02/11/2021 in [facility] Cardiac and Pulmonary Rehab  Education need identified 01/22/21       Biometrics:  Pre Biometrics - 01/22/21 1109       Pre Biometrics   Height 5' 4.25" (1.632 m)    Weight 107 lb 14.4 oz (48.9 kg)    BMI (Calculated) 18.38              Nutrition Therapy Plan and Nutrition Goals:  Nutrition Therapy & Goals - 02/02/21 1324       Nutrition Therapy   Diet Heart healthy, low Na, T2DM, high kcal/high protein    Protein (specify units) 65-80g    Fiber 30 grams    Whole Grain Foods 3 servings    Saturated Fats 16 max. grams    Fruits and Vegetables 8 servings/day    Sodium 2 grams       Personal Nutrition Goals   Nutrition Goal ST: 2-4 CHO per meals, 1-2 CHO snacks. Pair meals and snacks with fiber, fat, and protein. Switch to low Na deli meat, baked chicken, tuna instead of high Na deli meat.  LT: limit Na <2g/day, meet kcal and protein needs, become independent with CHO counting.    Comments 51 y.o. F admitted to rehab for NSTEMI. Presents with T2DM A1C 12.7, HTN. Relevant medications incluse lipitor, lasix, novolog, lantus, vit D. Tenessa has been trying to gain weight and is now up to 118.6lbs as of today (115lbs 12/22/20). PYP 78. Unable to get food recall other than shredded wheat or oatmeal with lactose free 1 or 2% milk and broccoli with corn. Avian reports her appetite has improved since quitting smoking during hospitalization 12/09/20. Her BG has been high in 300s and could not exercise on her first day of rehab due to high BG - saw MD this past friday, now taking short acting insulin at meal times (she is unsure of the name). Discussed diabetes MNT as well as some heart healthy and high kcal/protein MNT. Will continue to follow-up and review education as needed.      Intervention Plan   Intervention Prescribe, educate and counsel regarding individualized specific dietary modifications aiming towards targeted core components such as weight, hypertension, lipid management, diabetes, heart failure and other comorbidities.    Expected Outcomes Short Term Goal: Understand basic principles of dietary content, such as calories, fat, sodium, cholesterol and nutrients.;Short Term Goal: A plan has been developed with personal nutrition goals set during dietitian appointment.;Long Term Goal: Adherence to prescribed nutrition plan.             Nutrition Assessments:  MEDIFICTS Score Key: ?70 Need to make dietary changes  40-70 Heart Healthy Diet ? 40 Therapeutic Level Cholesterol Diet  Flowsheet Row Cardiac Rehab from 01/22/2021 in [facility] Cardiac and Pulmonary Rehab  Picture  Your Plate Total Score on Admission 78      Picture Your Plate Scores: <02 Unhealthy dietary pattern with much room for improvement. 41-50 Dietary pattern unlikely to meet recommendations for good health and room for improvement. 51-60 More healthful dietary pattern, with some room for improvement.  >60 Healthy dietary pattern, although there may be some specific behaviors that could be improved.    Nutrition Goals Re-Evaluation:   Nutrition Goals Discharge (Final Nutrition Goals Re-Evaluation):   Psychosocial: Target Goals: Acknowledge presence or absence of significant depression and/or stress, maximize coping skills, provide positive support system. Participant is able to verbalize types and ability to use techniques and skills needed for reducing stress and depression.   Education: Stress, Anxiety, and Depression - Group verbal and visual presentation to define topics covered.  Reviews how body is impacted by stress, anxiety, and depression.  Also discusses healthy ways to reduce stress and to treat/manage anxiety and depression.  Written material given at graduation.   Education: Sleep Hygiene -Provides group verbal and written instruction about how sleep can affect your health.  Define sleep hygiene, discuss sleep cycles and impact of  sleep habits. Review good sleep hygiene tips.    Initial Review & Psychosocial Screening:  Initial Psych Review & Screening - 12/31/20 1038       Initial Review   Current issues with None Identified      Family Dynamics   Good Support System? Yes   son, boyfriend     Barriers   Psychosocial barriers to participate in program There are no identifiable barriers or psychosocial needs.;The patient should benefit from training in stress management and relaxation.      Screening Interventions   Interventions Encouraged to exercise;Provide feedback about the scores to participant;To provide support and resources with identified psychosocial  needs    Expected Outcomes Short Term goal: Utilizing psychosocial counselor, staff and physician to assist with identification of specific Stressors or current issues interfering with healing process. Setting desired goal for each stressor or current issue identified.;Long Term Goal: Stressors or current issues are controlled or eliminated.;Short Term goal: Identification and review with participant of any Quality of Life or Depression concerns found by scoring the questionnaire.;Long Term goal: The participant improves quality of Life and PHQ9 Scores as seen by post scores and/or verbalization of changes             Quality of Life Scores:   Quality of Life - 01/22/21 1124       Quality of Life   Select Quality of Life      Quality of Life Scores   Health/Function Pre 24 %    Socioeconomic Pre 26.25 %    Psych/Spiritual Pre 30 %    Family Pre 28.8 %    GLOBAL Pre 26.4 %            Scores of 19 and below usually indicate a poorer quality of life in these areas.  A difference of  2-3 points is a clinically meaningful difference.  A difference of 2-3 points in the total score of the Quality of Life Index has been associated with significant improvement in overall quality of life, self-image, physical symptoms, and general health in studies assessing change in quality of life.  PHQ-9: Recent Review Flowsheet Data     Depression screen [facility] 2/9 01/22/2021   Decreased Interest 0   Down, Depressed, Hopeless 0   PHQ - 2 Score 0   Altered sleeping 1   Tired, decreased energy 1   Change in appetite 0   Feeling bad or failure about yourself  0   Trouble concentrating 0   Moving slowly or fidgety/restless 0   Suicidal thoughts 0   PHQ-9 Score 2   Difficult doing work/chores Somewhat difficult      Interpretation of Total Score  Total Score Depression Severity:  1-4 = Minimal depression, 5-9 = Mild depression, 10-14 = Moderate depression, 15-19 = Moderately severe depression,  20-27 = Severe depression   Psychosocial Evaluation and Intervention:  Psychosocial Evaluation - 12/31/20 1051       Psychosocial Evaluation & Interventions   Interventions Encouraged to exercise with the program and follow exercise prescription    Comments Rini reports doing well post NSTEMI and stent. She also received stents in her leg for PAD and states she still has some swelling in her left leg but for the most part can tell a huge positive difference in her symptoms. She is very motivated to change her lifestyle habits for the better. She has started eating heatlhier and paying attention to her diabetes more. She quit smoking the day  of her heart attack. She plans on returning to work once the doctors clear her to do so. She states she doesn't have any current issues with stress, and feels very supported by her boyfriend and son.    Expected Outcomes Short: attend cardiac rehab for education and exercise. Long: develop and maintain positive self care habits.    Continue Psychosocial Services  Follow up required by staff             Psychosocial Re-Evaluation:   Psychosocial Discharge (Final Psychosocial Re-Evaluation):   Vocational Rehabilitation: Provide vocational rehab assistance to qualifying candidates.   Vocational Rehab Evaluation & Intervention:  Vocational Rehab - 12/31/20 1038       Initial Vocational Rehab Evaluation & Intervention   Assessment shows need for Vocational Rehabilitation No             Education: Education Goals: Education classes will be provided on a variety of topics geared toward better understanding of heart health and risk factor modification. Participant will state understanding/return demonstration of topics presented as noted by education test scores.  Learning Barriers/Preferences:  Learning Barriers/Preferences - 12/31/20 1038       Learning Barriers/Preferences   Learning Barriers None    Learning Preferences None              General Cardiac Education Topics:  AED/CPR: - Group verbal and written instruction with the use of models to demonstrate the basic use of the AED with the basic ABC's of resuscitation.   Anatomy and Cardiac Procedures: - Group verbal and visual presentation and models provide information about basic cardiac anatomy and function. Reviews the testing methods done to diagnose heart disease and the outcomes of the test results. Describes the treatment choices: Medical Management, Angioplasty, or Coronary Bypass Surgery for treating various heart conditions including Myocardial Infarction, Angina, Valve Disease, and Cardiac Arrhythmias.  Written material given at graduation. Flowsheet Row Cardiac Rehab from 02/11/2021 in [facility] Cardiac and Pulmonary Rehab  Education need identified 01/22/21  Date 01/28/21  Educator [place]  Instruction Review Code 1- Verbalizes Understanding       Medication Safety: - Group verbal and visual instruction to review commonly prescribed medications for heart and lung disease. Reviews the medication, class of the drug, and side effects. Includes the steps to properly store meds and maintain the prescription regimen.  Written material given at graduation. Flowsheet Row Cardiac Rehab from 02/11/2021 in [facility] Cardiac and Pulmonary Rehab  Date 02/11/21  Educator [facility]  Instruction Review Code 1- Verbalizes Understanding       Intimacy: - Group verbal instruction through game format to discuss how heart and lung disease can affect sexual intimacy. Written material given at graduation..   Know Your Numbers and Heart Failure: - Group verbal and visual instruction to discuss disease risk factors for cardiac and pulmonary disease and treatment options.  Reviews associated critical values for Overweight/Obesity, Hypertension, Cholesterol, and Diabetes.  Discusses basics of heart failure: signs/symptoms and treatments.  Introduces Heart Failure Zone chart for action plan  for heart failure.  Written material given at graduation.   Infection Prevention: - Provides verbal and written material to individual with discussion of infection control including proper hand washing and proper equipment cleaning during exercise session. Flowsheet Row Cardiac Rehab from 02/11/2021 in [facility] Cardiac and Pulmonary Rehab  Education need identified 01/22/21  Date 01/22/21  Educator [place]  Instruction Review Code 1- Verbalizes Understanding       Falls Prevention: - Provides verbal and  written material to individual with discussion of falls prevention and safety. Flowsheet Row Cardiac Rehab from 02/11/2021 in [facility] Cardiac and Pulmonary Rehab  Education need identified 01/22/21  Date 01/22/21  Educator [place]  Instruction Review Code 1- Verbalizes Understanding       Other: -Provides group and verbal instruction on various topics (see comments)   Knowledge Questionnaire Score:  Knowledge Questionnaire Score - 01/22/21 1121       Knowledge Questionnaire Score   Pre Score 21/26: HTN, Angina, Nutrition, Exercise, Smoking             Core Components/Risk Factors/Patient Goals at Admission:  Personal Goals and Risk Factors at Admission - 01/22/21 1156       Core Components/Risk Factors/Patient Goals on Admission    Weight Management Yes;Weight Gain    Intervention Weight Management: Develop a combined nutrition and exercise program designed to reach desired caloric intake, while maintaining appropriate intake of nutrient and fiber, sodium and fats, and appropriate energy expenditure required for the weight goal.;Weight Management: Provide education and appropriate resources to help participant work on and attain dietary goals.;Weight Management/Obesity: Establish reasonable short term and long term weight goals.    Admit Weight 107 lb (48.5 kg)    Goal Weight: Short Term 109 lb (49.4 kg)    Goal Weight: Long Term 115 lb (52.2 kg)    Expected Outcomes Short Term:  Continue to assess and modify interventions until short term weight is achieved;Long Term: Adherence to nutrition and physical activity/exercise program aimed toward attainment of established weight goal;Understanding recommendations for meals to include 15-35% energy as protein, 25-35% energy from fat, 35-60% energy from carbohydrates, less than 200mg  of dietary cholesterol, 20-35 gm of total fiber daily;Understanding of distribution of calorie intake throughout the day with the consumption of 4-5 meals/snacks;Weight Gain: Understanding of general recommendations for a high calorie, high protein meal plan that promotes weight gain by distributing calorie intake throughout the day with the consumption for 4-5 meals, snacks, and/or supplements    Tobacco Cessation Yes    Number of packs per day quit 12/09/20    Intervention Offer self-teaching materials, assist with locating and accessing local/national Quit Smoking programs, and support quit date choice.    Expected Outcomes Short Term: Will quit all tobacco product use, adhering to prevention of relapse plan.;Long Term: Complete abstinence from all tobacco products for at least 12 months from quit date.    Diabetes Yes    Intervention Provide education about signs/symptoms and action to take for hypo/hyperglycemia.;Provide education about proper nutrition, including hydration, and aerobic/resistive exercise prescription along with prescribed medications to achieve blood glucose in normal ranges: Fasting glucose 65-99 mg/dL    Expected Outcomes Short Term: Participant verbalizes understanding of the signs/symptoms and immediate care of hyper/hypoglycemia, proper foot care and importance of medication, aerobic/resistive exercise and nutrition plan for blood glucose control.;Long Term: Attainment of HbA1C < 7%.    Hypertension Yes    Intervention Provide education on lifestyle modifcations including regular physical activity/exercise, weight management,  moderate sodium restriction and increased consumption of fresh fruit, vegetables, and low fat dairy, alcohol moderation, and smoking cessation.;Monitor prescription use compliance.    Expected Outcomes Short Term: Continued assessment and intervention until BP is < 140/11mm HG in hypertensive participants. < 130/93mm HG in hypertensive participants with diabetes, heart failure or chronic kidney disease.;Long Term: Maintenance of blood pressure at goal levels.             Education:Diabetes - Individual verbal and  written instruction to review signs/symptoms of diabetes, desired ranges of glucose level fasting, after meals and with exercise. Acknowledge that pre and post exercise glucose checks will be done for 3 sessions at entry of program. Flowsheet Row Cardiac Rehab from 02/11/2021 in [facility] Cardiac and Pulmonary Rehab  Education need identified 01/22/21  Date 01/22/21  Educator [place]  Instruction Review Code 1- Verbalizes Understanding       Core Components/Risk Factors/Patient Goals Review:    Core Components/Risk Factors/Patient Goals at Discharge (Final Review):    ITP Comments:  ITP Comments     [place] Name 12/31/20 1050 01/22/21 1109 01/22/21 1122 01/28/21 1333 01/28/21 1339   ITP Comments Initial telephone orientation completed. Diagnosis can be found in [facility] 11/15. EP orientation scheduled for Thursday 12/29 at 9:30. Completed 6MWT and gym orientation. Initial ITP created and sent for review to Dr. Emily Filbert,  Medical Director. Anwar has recently quit tobacco use within the last 6 months. Her quit date was on 12/09/20.  Intervention for relapse prevention was provided at the initial medical review. He was encouraged to continue to with tobacco cessation and was provided information on relapse prevention. Patient received information about combination therapy, tobacco cessation classes, quit line, and quit smoking apps in case of a relapse. Patient demonstrated understanding of this  material.Staff will continue to provide encouragement and follow up with the patient throughout the program. -- Patient unable to exercise today due to BG level being too high (352). Marcene Brawn, EP, provided education related to monitoring blood sugar and what is required to be allowed to exercise. Patient stated understanding.    [place] Name 02/02/21 1421 02/18/21 0931         ITP Comments Completed initial RD consultation 30 Day review completed. Medical Director ITP review done, changes made as directed, and signed approval by Medical Director.               Comments:

## 2021-02-23 ENCOUNTER — Other Ambulatory Visit: Payer: Self-pay

## 2021-02-23 DIAGNOSIS — Z48812 Encounter for surgical aftercare following surgery on the circulatory system: Secondary | ICD-10-CM | POA: Diagnosis not present

## 2021-02-23 DIAGNOSIS — I214 Non-ST elevation (NSTEMI) myocardial infarction: Secondary | ICD-10-CM

## 2021-02-23 DIAGNOSIS — Z955 Presence of coronary angioplasty implant and graft: Secondary | ICD-10-CM

## 2021-02-23 NOTE — Progress Notes (Signed)
Daily Session Note  Patient Details  Name: Beth Carroll MRN: [id number] Date of Birth: 1970/04/07 Referring Provider:   Flowsheet Row Cardiac Rehab from 01/22/2021 in [facility] Cardiac and Pulmonary Rehab  Referring Provider Harrel Lemon MD       Encounter Date: 02/23/2021  Check In:  Session Check In - 02/23/21 1414       Check-In   Supervising physician immediately available to respond to emergencies See telemetry face sheet for immediately available ER MD    Location [facility]-Cardiac & Pulmonary Rehab    Staff Present Birdie Sons, MPA, Nino Glow, MS, ASCM CEP, Exercise Physiologist;Joseph Tessie Fass, [place]    Virtual Visit No    Medication changes reported     No    Fall or balance concerns reported    No    Tobacco Cessation No Change    Warm-up and Cool-down Performed on first and last piece of equipment    Resistance Training Performed Yes    VAD Patient? No    PAD/SET Patient? No      Pain Assessment   Currently in Pain? No/denies                Social History   Tobacco Use  Smoking Status Former   Types: Cigarettes   Quit date: 12/09/2020   Years since quitting: 0.2  Smokeless Tobacco Never    Goals Met:  Independence with exercise equipment Exercise tolerated well No report of concerns or symptoms today Strength training completed today  Goals Unmet:  Not Applicable  Comments: Pt able to follow exercise prescription today without complaint.  Will continue to monitor for progression.    Dr. Emily Filbert is Medical Director for [place].  Dr. Ottie Glazier is Medical Director for [facility] Pulmonary Rehabilitation.

## 2021-02-25 ENCOUNTER — Other Ambulatory Visit: Payer: Self-pay

## 2021-02-25 ENCOUNTER — Encounter: Payer: No Typology Code available for payment source | Attending: Internal Medicine

## 2021-02-25 DIAGNOSIS — I252 Old myocardial infarction: Secondary | ICD-10-CM | POA: Insufficient documentation

## 2021-02-25 DIAGNOSIS — Z955 Presence of coronary angioplasty implant and graft: Secondary | ICD-10-CM | POA: Diagnosis present

## 2021-02-25 DIAGNOSIS — I214 Non-ST elevation (NSTEMI) myocardial infarction: Secondary | ICD-10-CM

## 2021-02-25 NOTE — Progress Notes (Signed)
Daily Session Note  Patient Details  Name: Beth Carroll MRN: [id number] Date of Birth: Aug 16, 1970 Referring Provider:   Flowsheet Row Cardiac Rehab from 01/22/2021 in [facility] Cardiac and Pulmonary Rehab  Referring Provider Harrel Lemon MD       Encounter Date: 02/25/2021  Check In:  Session Check In - 02/25/21 1353       Check-In   Supervising physician immediately available to respond to emergencies See telemetry face sheet for immediately available ER MD    Location [facility]-Cardiac & Pulmonary Rehab    Staff Present Birdie Sons, MPA, Nino Glow, MS, ASCM CEP, Exercise Physiologist;Joseph Tessie Fass, [place]    Virtual Visit No    Medication changes reported     No    Fall or balance concerns reported    No    Tobacco Cessation No Change    Warm-up and Cool-down Performed on first and last piece of equipment    Resistance Training Performed Yes    VAD Patient? No    PAD/SET Patient? No      Pain Assessment   Currently in Pain? No/denies                Social History   Tobacco Use  Smoking Status Former   Types: Cigarettes   Quit date: 12/09/2020   Years since quitting: 0.2  Smokeless Tobacco Never    Goals Met:  Independence with exercise equipment Exercise tolerated well No report of concerns or symptoms today Strength training completed today  Goals Unmet:  Not Applicable  Comments: Pt able to follow exercise prescription today without complaint.  Will continue to monitor for progression.    Dr. Emily Filbert is Medical Director for [place].  Dr. Ottie Glazier is Medical Director for [facility] Pulmonary Rehabilitation.

## 2021-02-26 ENCOUNTER — Other Ambulatory Visit: Payer: Self-pay

## 2021-02-26 ENCOUNTER — Encounter: Payer: No Typology Code available for payment source | Admitting: *Deleted

## 2021-02-26 DIAGNOSIS — I252 Old myocardial infarction: Secondary | ICD-10-CM | POA: Diagnosis not present

## 2021-02-26 DIAGNOSIS — I214 Non-ST elevation (NSTEMI) myocardial infarction: Secondary | ICD-10-CM

## 2021-02-26 DIAGNOSIS — Z955 Presence of coronary angioplasty implant and graft: Secondary | ICD-10-CM

## 2021-02-26 NOTE — Progress Notes (Signed)
Daily Session Note  Patient Details  Name: Armani Gawlik MRN: [id number] Date of Birth: [birth year] Referring Provider:   Flowsheet Row Cardiac Rehab from 01/22/2021 in [facility] Cardiac and Pulmonary Rehab  Referring Provider Harrel Lemon MD       Encounter Date: 02/26/2021  Check In:  Session Check In - 02/26/21 1359       Check-In   Supervising physician immediately available to respond to emergencies See telemetry face sheet for immediately available ER MD    Location [facility]-Cardiac & Pulmonary Rehab    Staff Present Renita Papa, RN BSN;Joseph [place], RCP,RRT,BSRT;Jessica [place], [place], RCEP, CCRP, CCET    Virtual Visit No    Medication changes reported     No    Fall or balance concerns reported    No    Tobacco Cessation No Change    Current number of cigarettes/nicotine per day     0    Warm-up and Cool-down Performed on first and last piece of equipment    Resistance Training Performed Yes    VAD Patient? No    PAD/SET Patient? No      Pain Assessment   Currently in Pain? No/denies                Social History   Tobacco Use  Smoking Status Former   Types: Cigarettes   Quit date: 12/09/2020   Years since quitting: 0.2  Smokeless Tobacco Never    Goals Met:  Independence with exercise equipment Exercise tolerated well No report of concerns or symptoms today Strength training completed today  Goals Unmet:  Not Applicable  Comments: Pt able to follow exercise prescription today without complaint.  Will continue to monitor for progression.    Dr. Emily Filbert is Medical Director for [place].  Dr. Ottie Glazier is Medical Director for [facility] Pulmonary Rehabilitation.

## 2021-03-02 ENCOUNTER — Other Ambulatory Visit: Payer: Self-pay

## 2021-03-02 DIAGNOSIS — Z955 Presence of coronary angioplasty implant and graft: Secondary | ICD-10-CM

## 2021-03-02 DIAGNOSIS — I214 Non-ST elevation (NSTEMI) myocardial infarction: Secondary | ICD-10-CM

## 2021-03-02 DIAGNOSIS — I252 Old myocardial infarction: Secondary | ICD-10-CM | POA: Diagnosis not present

## 2021-03-02 NOTE — Progress Notes (Signed)
Daily Session Note  Patient Details  Name: Beth Carroll MRN: [id number] Date of Birth: 1970-06-26 Referring Provider:   Flowsheet Row Cardiac Rehab from 01/22/2021 in [facility] Cardiac and Pulmonary Rehab  Referring Provider Harrel Lemon MD       Encounter Date: 03/02/2021  Check In:  Session Check In - 03/02/21 1356       Check-In   Supervising physician immediately available to respond to emergencies See telemetry face sheet for immediately available ER MD    Location [facility]-Cardiac & Pulmonary Rehab    Staff Present Birdie Sons, MPA, Nino Glow, MS, ASCM CEP, Exercise Physiologist;Joseph Tessie Fass, [place]    Virtual Visit No    Medication changes reported     No    Fall or balance concerns reported    No    Tobacco Cessation No Change    Warm-up and Cool-down Performed on first and last piece of equipment    Resistance Training Performed Yes    VAD Patient? No    PAD/SET Patient? No      Pain Assessment   Currently in Pain? No/denies                Social History   Tobacco Use  Smoking Status Former   Types: Cigarettes   Quit date: 12/09/2020   Years since quitting: 0.2  Smokeless Tobacco Never    Goals Met:  Independence with exercise equipment Exercise tolerated well No report of concerns or symptoms today Strength training completed today  Goals Unmet:  Not Applicable  Comments: Pt able to follow exercise prescription today without complaint.  Will continue to monitor for progression. Reviewed home exercise with pt today.  Pt plans to use staff videos and consider gym for exercise.  Reviewed THR, pulse, RPE, sign and symptoms, pulse oximetery and when to call 911 or MD.  Also discussed weather considerations and indoor options.  Pt voiced understanding.    Dr. Emily Filbert is Medical Director for [place].  Dr. Ottie Glazier is Medical Director for [facility] Pulmonary Rehabilitation.

## 2021-03-04 ENCOUNTER — Other Ambulatory Visit: Payer: Self-pay

## 2021-03-04 ENCOUNTER — Telehealth: Payer: Self-pay | Admitting: Medical

## 2021-03-04 DIAGNOSIS — Z955 Presence of coronary angioplasty implant and graft: Secondary | ICD-10-CM

## 2021-03-04 DIAGNOSIS — I214 Non-ST elevation (NSTEMI) myocardial infarction: Secondary | ICD-10-CM

## 2021-03-04 DIAGNOSIS — I252 Old myocardial infarction: Secondary | ICD-10-CM | POA: Diagnosis not present

## 2021-03-04 NOTE — Progress Notes (Signed)
Daily Session Note  Patient Details  Name: Beth Carroll MRN: [id number] Date of Birth: 08-Dec-1970 Referring Provider:   Flowsheet Row Cardiac Rehab from 01/22/2021 in [facility] Cardiac and Pulmonary Rehab  Referring Provider Harrel Lemon MD       Encounter Date: 03/04/2021  Check In:  Session Check In - 03/04/21 1405       Check-In   Supervising physician immediately available to respond to emergencies See telemetry face sheet for immediately available ER MD    Location [facility]-Cardiac & Pulmonary Rehab    Staff Present Birdie Sons, MPA, RN;Melissa [place], RDN, Tawanna [place], MS, ASCM CEP, Exercise Physiologist    Virtual Visit No    Medication changes reported     No    Fall or balance concerns reported    No    Tobacco Cessation No Change    Warm-up and Cool-down Performed on first and last piece of equipment    Resistance Training Performed Yes    VAD Patient? No    PAD/SET Patient? No      Pain Assessment   Currently in Pain? No/denies                Social History   Tobacco Use  Smoking Status Former   Types: Cigarettes   Quit date: 12/09/2020   Years since quitting: 0.2  Smokeless Tobacco Never    Goals Met:  Independence with exercise equipment Exercise tolerated well No report of concerns or symptoms today Strength training completed today  Goals Unmet:  Not Applicable  Comments: Pt able to follow exercise prescription today without complaint.  Will continue to monitor for progression.    Dr. Emily Filbert is Medical Director for [place].  Dr. Ottie Glazier is Medical Director for [facility] Pulmonary Rehabilitation.

## 2021-03-04 NOTE — Telephone Encounter (Signed)
Made patient aware we received [employer] short term disability forms Patient will come by to fill out forms and pay $29 fee Placed in form book

## 2021-03-05 ENCOUNTER — Encounter: Payer: No Typology Code available for payment source | Admitting: *Deleted

## 2021-03-05 ENCOUNTER — Other Ambulatory Visit: Payer: Self-pay

## 2021-03-05 DIAGNOSIS — I214 Non-ST elevation (NSTEMI) myocardial infarction: Secondary | ICD-10-CM

## 2021-03-05 DIAGNOSIS — I252 Old myocardial infarction: Secondary | ICD-10-CM | POA: Diagnosis not present

## 2021-03-05 DIAGNOSIS — Z955 Presence of coronary angioplasty implant and graft: Secondary | ICD-10-CM

## 2021-03-05 NOTE — Progress Notes (Signed)
Daily Session Note  Patient Details  Name: Beth Carroll MRN: [id number] Date of Birth: Aug 31, 1970 Referring Provider:   Flowsheet Row Cardiac Rehab from 01/22/2021 in [facility] Cardiac and Pulmonary Rehab  Referring Provider Harrel Lemon MD       Encounter Date: 03/05/2021  Check In:  Session Check In - 03/05/21 1400       Check-In   Supervising physician immediately available to respond to emergencies See telemetry face sheet for immediately available ER MD    Location [facility]-Cardiac & Pulmonary Rehab    Staff Present Renita Papa, RN BSN;Joseph [place], RCP,RRT,BSRT;Jessica [place], [place], RCEP, CCRP, CCET    Virtual Visit No    Medication changes reported     No    Fall or balance concerns reported    No    Tobacco Cessation No Change    Warm-up and Cool-down Performed on first and last piece of equipment    Resistance Training Performed Yes    VAD Patient? No    PAD/SET Patient? No      Pain Assessment   Currently in Pain? No/denies                Social History   Tobacco Use  Smoking Status Former   Types: Cigarettes   Quit date: 12/09/2020   Years since quitting: 0.2  Smokeless Tobacco Never    Goals Met:  Independence with exercise equipment Exercise tolerated well No report of concerns or symptoms today Strength training completed today  Goals Unmet:  Not Applicable  Comments: Pt able to follow exercise prescription today without complaint.  Will continue to monitor for progression.    Dr. Emily Filbert is Medical Director for [place].  Dr. Ottie Glazier is Medical Director for [facility] Pulmonary Rehabilitation.

## 2021-03-06 ENCOUNTER — Other Ambulatory Visit: Payer: Self-pay

## 2021-03-06 ENCOUNTER — Ambulatory Visit (INDEPENDENT_AMBULATORY_CARE_PROVIDER_SITE_OTHER): Payer: No Typology Code available for payment source | Admitting: Medical

## 2021-03-06 ENCOUNTER — Encounter: Payer: Self-pay | Admitting: Medical

## 2021-03-06 VITALS — BP 176/100 | HR 76 | Ht 66.0 in | Wt 123.0 lb

## 2021-03-06 DIAGNOSIS — Z0279 Encounter for issue of other medical certificate: Secondary | ICD-10-CM

## 2021-03-06 DIAGNOSIS — I10 Essential (primary) hypertension: Secondary | ICD-10-CM

## 2021-03-06 DIAGNOSIS — I2510 Atherosclerotic heart disease of native coronary artery without angina pectoris: Secondary | ICD-10-CM

## 2021-03-06 DIAGNOSIS — E782 Mixed hyperlipidemia: Secondary | ICD-10-CM

## 2021-03-06 DIAGNOSIS — I255 Ischemic cardiomyopathy: Secondary | ICD-10-CM

## 2021-03-06 DIAGNOSIS — I5022 Chronic systolic (congestive) heart failure: Secondary | ICD-10-CM

## 2021-03-06 MED ORDER — DAPAGLIFLOZIN PROPANEDIOL 10 MG PO TABS: 10.0000 mg | ORAL_TABLET | Freq: Every day | ORAL | 3 refills | Status: DC

## 2021-03-06 NOTE — Patient Instructions (Signed)
Medication Instructions:   Your physician has recommended you make the following change in your medication:   STOP Hydrochlorothiazide (HCTZ)  START Farxiga 10 mg daily   *If you need a refill on your cardiac medications before your next appointment, please call your pharmacy*   Lab Work:  Your physician recommends that you return for lab work in: [place] (BMET)   - These labs do not require fasting  Testing/Procedures:  None ordered   Follow-Up: At [facility], you and your health needs are our priority.  As part of our continuing mission to provide you with exceptional heart care, we have created designated Provider Care Teams.  These Care Teams include your primary Cardiologist (physician) and Advanced Practice Providers (APPs -  Physician Assistants and Nurse Practitioners) who all work together to provide you with the care you need, when you need it.  We recommend signing up for the patient portal called "MyChart".  Sign up information is provided on this After Visit Summary.  MyChart is used to connect with patients for Virtual Visits (Telemedicine).  Patients are able to view lab/test results, encounter notes, upcoming appointments, etc.  Non-urgent messages can be sent to your provider as well.   To learn more about what you can do with MyChart, go to [email or web address].    Your next appointment:    3 week(s) with Cadence Kathlen Mody, PA-C  The format for your next appointment:   In Person  Provider:   Cadence Kathlen Mody, PA-C{

## 2021-03-06 NOTE — Progress Notes (Signed)
Cardiology Office Note:    Date:  03/06/2021   ID:  Beth Carroll, DOB 07-31-1970, MRN [id number]  PCP:  Baxter Hire, MD  [facility] Cardiologist:  Ida Rogue, MD  [facility] Electrophysiologist:  None   Referring MD: Baxter Hire, MD   Chief Complaint: 1 month  History of Present Illness:    Beth Carroll is a 51 y.o. female with a hx of DM2, HTN, and lower extremity swelling and ongoing tobacco use who is being seen today for hospital follow-up.    Admitted 12/09/20 for dizziness, nausea and poor appetite. Upon arrival to the ED she was hypertensive with BP in the 269S systolic, hypokalemic with a potassium of 2.4, BNP 806, and high-sensitivity troponin 28 with a delta of 27.  EKG showed a sinus rhythm with possible prior anterior infarct.  Chest x-ray showed no acute findings.  ABIs performed in the ED were notable for 0.4 on the right and 0.3 on the left with very poor waveforms.  She was started on a heparin drip and vascular surgery was consulted.  On 11/16 and she underwent left lower extremity intervention as outlined in the operative note.  Plans are to intervene on the right lower extremity, with timing uncertain at this time.  Echo showed an EF of 40-45%, apical and periapical hypokinesis with wall motion best preserved along the basal/mid wall, G2DD, normal RV systolic function and ventricular cavity size, estimated right atrial pressure 15 mmHg, and no significant valvular abnormalities.  Due to abnormal echo, cardiology was consulted.  She underwent cardiac cath for CM that showed subttal occlusion of the mLAD which was treated with DES to proximal ramus and LAD. She was started on DAPT with ASA and Plavix.   Last seen 02/02/21 and was overall doing well from a cardiac standpoint. She had orthostatic hypotension and lisinopril was stopped.   Today, BP is high today, however this is unusual for the patient. BP at home SBP 130s. Re-check BP was 130/80.  She reports  some chest soreness. No SOB. She is doing rehab three times a week. She is tender on palpitation. Reports improved lower leg edema. She wears compression socks. Has some puffiness on her lower legs. Dizziness overall improved since stopping lisinopril. She is back at work.She says she is still taking HCTZ, this was stopped a discharge.   Past Medical History:  Diagnosis Date   Coronary artery disease    Diabetes mellitus without complication ([place])    Diabetes mellitus, type 2 (HCC)    HFrEF (heart failure with reduced ejection fraction) ([place])    Hyperlipemia    Hypertension    Ischemic cardiomyopathy    Orthostatic hypotension    PAD (peripheral artery disease) ([place])    Tobacco use     Past Surgical History:  Procedure Laterality Date   CORONARY STENT INTERVENTION N/A 12/15/2020   Procedure: CORONARY STENT INTERVENTION;  Surgeon: Wellington Hampshire, MD;  Location: [place] [facility];  Service: Cardiovascular;  Laterality: N/A;   LEFT HEART CATH AND CORONARY ANGIOGRAPHY N/A 12/15/2020   Procedure: LEFT HEART CATH AND CORONARY ANGIOGRAPHY;  Surgeon: Wellington Hampshire, MD;  Location: [place] [facility];  Service: Cardiovascular;  Laterality: N/A;   LOWER EXTREMITY ANGIOGRAPHY Left 12/10/2020   Procedure: Lower Extremity Angiography;  Surgeon: Algernon Huxley, MD;  Location: [place] [facility];  Service: Cardiovascular;  Laterality: Left;   LOWER EXTREMITY ANGIOGRAPHY Right 12/25/2020   Procedure: LOWER EXTREMITY ANGIOGRAPHY;  Surgeon:  Algernon Huxley, MD;  Location: [place] [facility];  Service: Cardiovascular;  Laterality: Right;    Current Medications: Current Meds  Medication Sig   albuterol (VENTOLIN HFA) 108 (90 Base) MCG/ACT inhaler Inhale 2 puffs into the lungs every 6 (six) hours as needed.   aspirin EC 81 MG EC tablet Take 1 tablet (81 mg total) by mouth daily. Swallow whole.   atorvastatin (LIPITOR) 20 MG tablet Take 1 tablet (20 mg total) by mouth daily.   carvedilol (COREG)  3.125 MG tablet Take 1 tablet (3.125 mg total) by mouth 2 (two) times daily with a meal.   clopidogrel (PLAVIX) 75 MG tablet Take 1 tablet (75 mg total) by mouth daily.   dapagliflozin propanediol (FARXIGA) 10 MG TABS tablet Take 1 tablet (10 mg total) by mouth daily before breakfast.   furosemide (LASIX) 20 MG tablet Take 20 mg by mouth daily.   insulin aspart (NOVOLOG) 100 UNIT/ML FlexPen Inject 5 Units into the skin 3 (three) times daily with meals.   insulin glargine (LANTUS SOLOSTAR) 100 UNIT/ML Solostar Pen Inject 25 Units into the skin daily.   pantoprazole (PROTONIX) 40 MG tablet Take 1 tablet (40 mg total) by mouth daily.   Vitamin D, Ergocalciferol, (DRISDOL) 1.25 MG (50000 UNIT) CAPS capsule Take 50,000 Units by mouth once a week.   [DISCONTINUED] hydrochlorothiazide (HYDRODIURIL) 25 MG tablet Take 25 mg by mouth daily.     Allergies:   Patient has no known allergies.   Social History   Socioeconomic History   Marital status: Single    Spouse name: Not on file   Number of children: Not on file   Years of education: Not on file   Highest education level: Not on file  Occupational History   Not on file  Tobacco Use   Smoking status: Former    Types: Cigarettes    Quit date: 12/09/2020    Years since quitting: 0.2   Smokeless tobacco: Never  Vaping Use   Vaping Use: Never used  Substance and Sexual Activity   Alcohol use: Never   Drug use: Not on file   Sexual activity: Not on file  Other Topics Concern   Not on file  Social History Narrative   Not on file   Social Determinants of Health   Financial Resource Strain: Not on file  Food Insecurity: Not on file  Transportation Needs: Not on file  Physical Activity: Not on file  Stress: Not on file  Social Connections: Not on file     Family History: The patient's family history includes Hypertension in her father.  ROS:   Please see the history of present illness.     All other systems reviewed and are  negative.  EKGs/Labs/Other Studies Reviewed:    The following studies were reviewed today:  Cardiac cath 12/15/20   Mid LAD lesion is 99% stenosed.   Ramus lesion is 90% stenosed.   Mid LAD to Dist LAD lesion is 40% stenosed.   A drug-eluting stent was successfully placed using a STENT ONYX FRONTIER 2.25X18.   A drug-eluting stent was successfully placed using a STENT ONYX FRONTIER 2.75X22.   Post intervention, there is a 0% residual stenosis.   Post intervention, there is a 0% residual stenosis.   The left ventricular systolic function is normal.   LV end diastolic pressure is normal.   The left ventricular ejection fraction is 35-45% by visual estimate.   1.  Severe two-vessel coronary artery disease with  subtotal occlusion of the mid LAD as well as significant stenosis in proximal ramus.  The LAD is diffusely diseased and small in size in the mid to distal segment. 2.  Moderately reduced LV systolic function with akinesis of mid to distal anterior and apical myocardium.  Normal left ventricular end-diastolic pressure. 3.  Successful angioplasty and drug-eluting stent placement to the mid LAD as well as proximal ramus.   Recommendations: Dual antiplatelet therapy for at least 6 months. Aggressive treatment of risk factors.  Coronary Diagrams  Diagnostic Dominance: Right Intervention    Echo 12/10/20  1. Left ventricular ejection fraction, by estimation, is 40 to 45%. The  left ventricle has mildly decreased function. The left ventricle  demonstrates regional wall motion abnormalities (Apical and periapical  hypokinesis, basal and mid wall motion best  preserved, consistent with possible stress cardiomyopathy though unable to  exclude ishcemia). Left ventricular diastolic parameters are consistent  with Grade II diastolic dysfunction (pseudonormalization).   2. Right ventricular systolic function is normal. The right ventricular  size is normal. Tricuspid regurgitation  signal is inadequate for assessing  PA pressure.   3. The mitral valve is normal in structure. No evidence of mitral valve  regurgitation. No evidence of mitral stenosis.   4. The aortic valve is normal in structure. Aortic valve regurgitation is  not visualized. No aortic stenosis is present.   5. The inferior vena cava is dilated in size with <50% respiratory  variability, suggesting right atrial pressure of 15 mmHg.   EKG:  EKG is not ordered today.  Recent Labs: 12/09/2020: ALT 12; B Natriuretic Peptide 106.3 12/10/2020: Magnesium 2.0 12/16/2020: Hemoglobin 10.0; Platelets 184; Potassium 4.2; Sodium 136 12/25/2020: BUN 14; Creatinine, Ser <0.30  Recent Lipid Panel    Component Value Date/Time   CHOL 120 12/12/2020 0532   TRIG 59 12/12/2020 0532   HDL 45 12/12/2020 0532   CHOLHDL 2.7 12/12/2020 0532   VLDL 12 12/12/2020 0532   LDLCALC 63 12/12/2020 0532     Physical Exam:    VS:  BP (!) 176/100 (BP Location: Left Arm, Patient Position: Sitting, Cuff Size: Normal)    Pulse 76    Ht 5\' 6"  (1.676 m)    Wt 123 lb (55.8 kg)    SpO2 98%    BMI 19.85 kg/m     Wt Readings from Last 3 Encounters:  03/06/21 123 lb (55.8 kg)  02/02/21 117 lb (53.1 kg)  01/22/21 107 lb 14.4 oz (48.9 kg)     GEN:  Well nourished, well developed in no acute distress HEENT: Normal NECK: No JVD; No carotid bruits LYMPHATICS: No lymphadenopathy CARDIAC: RRR, no murmurs, rubs, gallops RESPIRATORY:  Clear to auscultation without rales, wheezing or rhonchi  ABDOMEN: Soft, non-tender, non-distended MUSCULOSKELETAL:  No edema; No deformity  SKIN: Warm and dry NEUROLOGIC:  Alert and oriented x 3 PSYCHIATRIC:  Normal affect   ASSESSMENT:    1. Coronary artery disease involving native coronary artery of native heart without angina pectoris   2. Primary hypertension   3. Ischemic cardiomyopathy   4. Chronic systolic heart failure ([place])   5. Essential hypertension   6. Hyperlipidemia, mixed    PLAN:     In order of problems listed above:  CAD s/p PCI to LAD and Ramus Patient denies anginal symptoms. She has chest wall soreness, tender to palpitation. She is doing cardiac rehab and progressing well. She is back to work and using her wrist more, so has some soreness.  No further ischemic work-up indicated. Continue DAPT with Aspirin with Plavix. Continue Lipitor and Coreg.  Orthostatic hypotension Initial BP was high. BP recheck was normal. Still reports some dizziness, but overall improved. Apparently she is still taking HCTZ, which had been discontinued at discharge. I will stop this. Add on Farxiga for HFrEF  HFrEF ICM EF 40-45% She is euvolemic on exam. Stop HCTZ as above. I will add Wilder Glade, BMET in a week. Caution with addition of GDMT given dizziness/orthostasis. Continue GDMT at follow-up as able. Can re-check an echo at follow-up.   HLD LDL 63. Continue Lipitor.   DM2 A1C 12.7 01/30/21. This is being followed closely by PCP.   PAD/critical ischemia She is following closely with [facility], visit in March.    Disposition: Follow up in 2-3 week(s) with MD/APP    Signed, Romana Deaton Ninfa Meeker, PA-C  03/06/2021 4:23 PM    [facility]

## 2021-03-11 ENCOUNTER — Other Ambulatory Visit: Payer: Self-pay

## 2021-03-11 DIAGNOSIS — Z955 Presence of coronary angioplasty implant and graft: Secondary | ICD-10-CM

## 2021-03-11 DIAGNOSIS — I214 Non-ST elevation (NSTEMI) myocardial infarction: Secondary | ICD-10-CM

## 2021-03-11 DIAGNOSIS — I252 Old myocardial infarction: Secondary | ICD-10-CM | POA: Diagnosis not present

## 2021-03-11 NOTE — Progress Notes (Signed)
Daily Session Note  Patient Details  Name: Beth Carroll MRN: [id number] Date of Birth: 1970/09/04 Referring Provider:   Flowsheet Row Cardiac Rehab from 01/22/2021 in [facility] Cardiac and Pulmonary Rehab  Referring Provider Harrel Lemon MD       Encounter Date: 03/11/2021  Check In:  Session Check In - 03/11/21 1354       Check-In   Supervising physician immediately available to respond to emergencies See telemetry face sheet for immediately available ER MD    Location [facility]-Cardiac & Pulmonary Rehab    Staff Present Birdie Sons, MPA, RN;Jessica [place], MA, RCEP, CCRP, CCET;Joseph [place], RCP,RRT,BSRT;Kara Nelson, [place], ASCM CEP, Exercise Physiologist    Virtual Visit No    Medication changes reported     No    Fall or balance concerns reported    No    Tobacco Cessation No Change    Warm-up and Cool-down Performed on first and last piece of equipment    Resistance Training Performed Yes    VAD Patient? No    PAD/SET Patient? No      Pain Assessment   Currently in Pain? No/denies                Social History   Tobacco Use  Smoking Status Former   Types: Cigarettes   Quit date: 12/09/2020   Years since quitting: 0.2  Smokeless Tobacco Never    Goals Met:  Independence with exercise equipment Exercise tolerated well No report of concerns or symptoms today Strength training completed today  Goals Unmet:  Not Applicable  Comments: Pt able to follow exercise prescription today without complaint.  Will continue to monitor for progression.    Dr. Emily Filbert is Medical Director for [place].  Dr. Ottie Glazier is Medical Director for [facility] Pulmonary Rehabilitation.

## 2021-03-12 ENCOUNTER — Other Ambulatory Visit (INDEPENDENT_AMBULATORY_CARE_PROVIDER_SITE_OTHER): Payer: No Typology Code available for payment source

## 2021-03-12 ENCOUNTER — Encounter: Payer: No Typology Code available for payment source | Admitting: *Deleted

## 2021-03-12 DIAGNOSIS — I255 Ischemic cardiomyopathy: Secondary | ICD-10-CM

## 2021-03-12 DIAGNOSIS — I10 Essential (primary) hypertension: Secondary | ICD-10-CM | POA: Diagnosis not present

## 2021-03-12 DIAGNOSIS — Z955 Presence of coronary angioplasty implant and graft: Secondary | ICD-10-CM

## 2021-03-12 DIAGNOSIS — I214 Non-ST elevation (NSTEMI) myocardial infarction: Secondary | ICD-10-CM

## 2021-03-12 DIAGNOSIS — I5022 Chronic systolic (congestive) heart failure: Secondary | ICD-10-CM

## 2021-03-12 DIAGNOSIS — I252 Old myocardial infarction: Secondary | ICD-10-CM | POA: Diagnosis not present

## 2021-03-12 NOTE — Progress Notes (Signed)
Daily Session Note  Patient Details  Name: Maylea Soria MRN: [id number] Date of Birth: [birth year] Referring Provider:   Flowsheet Row Cardiac Rehab from 01/22/2021 in [facility] Cardiac and Pulmonary Rehab  Referring Provider Harrel Lemon MD       Encounter Date: 03/12/2021  Check In:  Session Check In - 03/12/21 1353       Check-In   Supervising physician immediately available to respond to emergencies See telemetry face sheet for immediately available ER MD    Location [facility]-Cardiac & Pulmonary Rehab    Staff Present Renita Papa, RN BSN;Joseph [place], RCP,RRT,BSRT;Jessica [place], [place], RCEP, CCRP, CCET    Virtual Visit No    Medication changes reported     No    Fall or balance concerns reported    No    Warm-up and Cool-down Performed on first and last piece of equipment    Resistance Training Performed Yes    VAD Patient? No    PAD/SET Patient? No      Pain Assessment   Currently in Pain? No/denies                Social History   Tobacco Use  Smoking Status Former   Types: Cigarettes   Quit date: 12/09/2020   Years since quitting: 0.2  Smokeless Tobacco Never    Goals Met:  Independence with exercise equipment Exercise tolerated well No report of concerns or symptoms today Strength training completed today  Goals Unmet:  Not Applicable  Comments: Pt able to follow exercise prescription today without complaint.  Will continue to monitor for progression.    Dr. Emily Filbert is Medical Director for [place].  Dr. Ottie Glazier is Medical Director for [facility] Pulmonary Rehabilitation.

## 2021-03-13 ENCOUNTER — Telehealth: Payer: Self-pay | Admitting: Emergency Medicine

## 2021-03-13 ENCOUNTER — Telehealth: Payer: Self-pay | Admitting: Medical

## 2021-03-13 DIAGNOSIS — I5022 Chronic systolic (congestive) heart failure: Secondary | ICD-10-CM

## 2021-03-13 LAB — BASIC METABOLIC PANEL
BUN/Creatinine Ratio: 27 — ABNORMAL HIGH (ref 9–23)
BUN: 12 mg/dL (ref 6–24)
CO2: 28 mmol/L (ref 20–29)
Calcium: 9.5 mg/dL (ref 8.7–10.2)
Chloride: 97 mmol/L (ref 96–106)
Creatinine, Ser: 0.45 mg/dL — ABNORMAL LOW (ref 0.57–1.00)
Glucose: 299 mg/dL — ABNORMAL HIGH (ref 70–99)
Potassium: 3.3 mmol/L — ABNORMAL LOW (ref 3.5–5.2)
Sodium: 140 mmol/L (ref 134–144)
eGFR: 116 mL/min/{1.73_m2} (ref >59)

## 2021-03-13 NOTE — Telephone Encounter (Signed)
Completed forms faxed to [employer] Attempted to inform patient - VM full

## 2021-03-13 NOTE — Telephone Encounter (Signed)
Received forms from [employer]  Patient came by office 02/10 to complete patient forms and pay cash for $29 fee and placed in nurse box Received completed forms on 02/17

## 2021-03-13 NOTE — Telephone Encounter (Signed)
Called and spoke with patient, reviewed results and provider recommendations. Pt verbalized understanding.   Lab appt scheduled for 2/22 at 3:45 for repeat BMET.

## 2021-03-13 NOTE — Telephone Encounter (Signed)
-----   Message from [place], PA-C sent at 03/13/2021  9:55 AM EST ----- Labs overall OK, potassium is a little low.she was previously on HCTZ and lasix, so lets re-check potassium in a week and see how it is.

## 2021-03-16 ENCOUNTER — Encounter: Payer: No Typology Code available for payment source | Admitting: *Deleted

## 2021-03-16 ENCOUNTER — Other Ambulatory Visit: Payer: Self-pay

## 2021-03-16 DIAGNOSIS — Z955 Presence of coronary angioplasty implant and graft: Secondary | ICD-10-CM

## 2021-03-16 DIAGNOSIS — I252 Old myocardial infarction: Secondary | ICD-10-CM | POA: Diagnosis not present

## 2021-03-16 DIAGNOSIS — I214 Non-ST elevation (NSTEMI) myocardial infarction: Secondary | ICD-10-CM

## 2021-03-16 NOTE — Progress Notes (Deleted)
Daily Session Note  Patient Details  Name: Beth Carroll MRN: [id number] Date of Birth: 05-19-1970 Referring Provider:   Flowsheet Row Cardiac Rehab from 01/22/2021 in [facility] Cardiac and Pulmonary Rehab  Referring Provider Harrel Lemon MD       Encounter Date: 03/16/2021  Check In:      Social History   Tobacco Use  Smoking Status Former   Types: Cigarettes   Quit date: 12/09/2020   Years since quitting: 0.2  Smokeless Tobacco Never    Goals Met:    Goals Unmet:  Not Applicable  Comments: Pt able to follow exercise prescription today without complaint.  Will continue to monitor for progression.    Dr. Emily Filbert is Medical Director for [place].  Dr. Ottie Glazier is Medical Director for [facility] Pulmonary Rehabilitation.

## 2021-03-16 NOTE — Progress Notes (Signed)
Daily Session Note  Patient Details  Name: Leili Eskenazi MRN: [id number] Date of Birth: 1970-12-09 Referring Provider:   Flowsheet Row Cardiac Rehab from 01/22/2021 in [facility] Cardiac and Pulmonary Rehab  Referring Provider Harrel Lemon MD       Encounter Date: 03/16/2021  Check In:  Session Check In - 03/16/21 1426       Check-In   Supervising physician immediately available to respond to emergencies See telemetry face sheet for immediately available ER MD    Location [facility]-Cardiac & Pulmonary Rehab    Staff Present Nyoka Cowden, RN, BSN, Tyna Jaksch, MS, ASCM CEP, Exercise Physiologist;Joseph Tessie Fass, [place]    Virtual Visit No    Medication changes reported     No    Fall or balance concerns reported    No    Tobacco Cessation No Change    Warm-up and Cool-down Performed on first and last piece of equipment    Resistance Training Performed Yes    VAD Patient? No    PAD/SET Patient? No      Pain Assessment   Currently in Pain? No/denies                Social History   Tobacco Use  Smoking Status Former   Types: Cigarettes   Quit date: 12/09/2020   Years since quitting: 0.2  Smokeless Tobacco Never    Goals Met:  Independence with exercise equipment Exercise tolerated well No report of concerns or symptoms today  Goals Unmet:  Not Applicable  Comments: Pt able to follow exercise prescription today without complaint.  Will continue to monitor for progression.    Dr. Emily Filbert is Medical Director for [place].  Dr. Ottie Glazier is Medical Director for [facility] Pulmonary Rehabilitation.

## 2021-03-16 NOTE — Progress Notes (Signed)
Daily Session Note ° °Patient Details  °Name: Beth Carroll °MRN: [id number] °Date of Birth: 12/22/1970 °Referring Provider:   °Flowsheet Row Cardiac Rehab from 01/22/2021 in [facility] Cardiac and Pulmonary Rehab  °Referring Provider Johnston, John MD  ° °  ° ° °Encounter Date: 03/16/2021 ° °Check In: ° Session Check In - 03/16/21 1426   ° °  ° Check-In  ° Supervising physician immediately available to respond to emergencies See telemetry face sheet for immediately available ER MD   ° Location [facility]-Cardiac & Pulmonary Rehab   ° Staff Present Lamyra Malcolm Jo Virlee Stroschein, RN, BSN, MA;Kara Langdon, MS, ASCM CEP, Exercise Physiologist;Joseph [place], RCP,RRT,BSRT   ° Virtual Visit No   ° Medication changes reported     No   ° Fall or balance concerns reported    No   ° Tobacco Cessation No Change   ° Warm-up and Cool-down Performed on first and last piece of equipment   ° Resistance Training Performed Yes   ° VAD Patient? No   ° PAD/SET Patient? No   °  ° Pain Assessment  ° Currently in Pain? No/denies   ° °  °  ° °  ° ° ° ° ° °Social History  ° °Tobacco Use  °Smoking Status Former  ° Types: Cigarettes  ° Quit date: 12/09/2020  ° Years since quitting: 0.2  °Smokeless Tobacco Never  ° ° °Goals Met:  °Independence with exercise equipment °Exercise tolerated well °No report of concerns or symptoms today ° °Goals Unmet:  °Not Applicable ° °Comments: Pt able to follow exercise prescription today without complaint.  Will continue to monitor for progression.  ° ° °Dr. Mark Miller is Medical Director for [facility].  °Dr. Fuad Aleskerov is Medical Director for [facility] Pulmonary Rehabilitation. °

## 2021-03-18 ENCOUNTER — Other Ambulatory Visit: Payer: Self-pay

## 2021-03-18 ENCOUNTER — Encounter: Payer: Self-pay | Admitting: *Deleted

## 2021-03-18 ENCOUNTER — Other Ambulatory Visit (INDEPENDENT_AMBULATORY_CARE_PROVIDER_SITE_OTHER): Payer: No Typology Code available for payment source

## 2021-03-18 DIAGNOSIS — I252 Old myocardial infarction: Secondary | ICD-10-CM | POA: Diagnosis not present

## 2021-03-18 DIAGNOSIS — I5022 Chronic systolic (congestive) heart failure: Secondary | ICD-10-CM | POA: Diagnosis not present

## 2021-03-18 DIAGNOSIS — Z955 Presence of coronary angioplasty implant and graft: Secondary | ICD-10-CM

## 2021-03-18 DIAGNOSIS — I214 Non-ST elevation (NSTEMI) myocardial infarction: Secondary | ICD-10-CM

## 2021-03-18 NOTE — Progress Notes (Signed)
Cardiac Individual Treatment Plan  Patient Details  Name: Beth Carroll MRN: [id number] Date of Birth: 1970/08/09 Referring Provider:   Flowsheet Row Cardiac Rehab from 01/22/2021 in [facility] Cardiac and Pulmonary Rehab  Referring Provider Harrel Lemon MD       Initial Encounter Date:  Flowsheet Row Cardiac Rehab from 01/22/2021 in [facility] Cardiac and Pulmonary Rehab  Date 01/22/21       Visit Diagnosis: NSTEMI (non-ST elevated myocardial infarction) [facility])  Status post coronary artery stent placement  Patient's Home Medications on Admission:  Current Outpatient Medications:    albuterol (VENTOLIN HFA) 108 (90 Base) MCG/ACT inhaler, Inhale 2 puffs into the lungs every 6 (six) hours as needed., Disp: , Rfl:    aspirin EC 81 MG EC tablet, Take 1 tablet (81 mg total) by mouth daily. Swallow whole., Disp: 30 tablet, Rfl: 2   atorvastatin (LIPITOR) 20 MG tablet, Take 1 tablet (20 mg total) by mouth daily., Disp: 30 tablet, Rfl: 0   carvedilol (COREG) 3.125 MG tablet, Take 1 tablet (3.125 mg total) by mouth 2 (two) times daily with a meal., Disp: 60 tablet, Rfl: 0   clopidogrel (PLAVIX) 75 MG tablet, Take 1 tablet (75 mg total) by mouth daily., Disp: 30 tablet, Rfl: 1   dapagliflozin propanediol (FARXIGA) 10 MG TABS tablet, Take 1 tablet (10 mg total) by mouth daily before breakfast., Disp: 30 tablet, Rfl: 3   furosemide (LASIX) 20 MG tablet, Take 20 mg by mouth daily., Disp: , Rfl:    insulin aspart (NOVOLOG) 100 UNIT/ML FlexPen, Inject 5 Units into the skin 3 (three) times daily with meals., Disp: , Rfl:    insulin glargine (LANTUS SOLOSTAR) 100 UNIT/ML Solostar Pen, Inject 25 Units into the skin daily., Disp: , Rfl:    pantoprazole (PROTONIX) 40 MG tablet, Take 1 tablet (40 mg total) by mouth daily., Disp: 30 tablet, Rfl: 0   Vitamin D, Ergocalciferol, (DRISDOL) 1.25 MG (50000 UNIT) CAPS capsule, Take 50,000 Units by mouth once a week., Disp: , Rfl:   Past Medical History: Past Medical  History:  Diagnosis Date   Coronary artery disease    Diabetes mellitus without complication ([place])    Diabetes mellitus, type 2 ([place])    HFrEF (heart failure with reduced ejection fraction) ([place])    Hyperlipemia    Hypertension    Ischemic cardiomyopathy    Orthostatic hypotension    PAD (peripheral artery disease) ([place])    Tobacco use     Tobacco Use: Social History   Tobacco Use  Smoking Status Former   Types: Cigarettes   Quit date: 12/09/2020   Years since quitting: 0.2  Smokeless Tobacco Never    Labs: Recent Review Flowsheet Data     Labs for ITP Cardiac and Pulmonary Rehab Latest Ref Rng & Units 12/10/2020 12/12/2020   Cholestrol 0 - 200 mg/dL - 120   LDLCALC 0 - 99 mg/dL - 63   HDL >40 mg/dL - 45   Trlycerides <150 mg/dL - 59   Hemoglobin A1c 4.8 - 5.6 % 13.7(H) -        Exercise Target Goals: Exercise Program Goal: Individual exercise prescription set using results from initial 6 min walk test and THRR while considering  patients activity barriers and safety.   Exercise Prescription Goal: Initial exercise prescription builds to 30-45 minutes a day of aerobic activity, 2-3 days per week.  Home exercise guidelines will be given to patient during program as part of exercise prescription that the participant will  acknowledge.   Education: Aerobic Exercise: - Group verbal and visual presentation on the components of exercise prescription. Introduces F.I.T.T principle from ACSM for exercise prescriptions.  Reviews F.I.T.T. principles of aerobic exercise including progression. Written material given at graduation. Flowsheet Row Cardiac Rehab from 03/11/2021 in [facility] Cardiac and Pulmonary Rehab  Education need identified 01/22/21       Education: Resistance Exercise: - Group verbal and visual presentation on the components of exercise prescription. Introduces F.I.T.T principle from ACSM for exercise prescriptions  Reviews F.I.T.T. principles of resistance  exercise including progression. Written material given at graduation. Flowsheet Row Cardiac Rehab from 03/11/2021 in [facility] Cardiac and Pulmonary Rehab  Date 01/28/21  Educator AS  Instruction Review Code 1- Verbalizes Understanding        Education: Exercise & Equipment Safety: - Individual verbal instruction and demonstration of equipment use and safety with use of the equipment. Flowsheet Row Cardiac Rehab from 03/11/2021 in [facility] Cardiac and Pulmonary Rehab  Education need identified 01/22/21  Date 01/22/21  Educator [place]  Instruction Review Code 1- Verbalizes Understanding       Education: Exercise Physiology & General Exercise Guidelines: - Group verbal and written instruction with models to review the exercise physiology of the cardiovascular system and associated critical values. Provides general exercise guidelines with specific guidelines to those with heart or lung disease.    Education: Flexibility, Balance, Mind/Body Relaxation: - Group verbal and visual presentation with interactive activity on the components of exercise prescription. Introduces F.I.T.T principle from ACSM for exercise prescriptions. Reviews F.I.T.T. principles of flexibility and balance exercise training including progression. Also discusses the mind body connection.  Reviews various relaxation techniques to help reduce and manage stress (i.e. Deep breathing, progressive muscle relaxation, and visualization). Balance handout provided to take home. Written material given at graduation.   Activity Barriers & Risk Stratification:  Activity Barriers & Cardiac Risk Stratification - 01/22/21 1110       Activity Barriers & Cardiac Risk Stratification   Activity Barriers Other (comment);Deconditioning;Muscular [employer] Device    Comments PAD, recent stents in legs (left has some swelling still)    Cardiac Risk Stratification High             6 Minute Walk:  6 Minute Walk     Row Name 01/22/21  1116         6 Minute Walk   Phase Initial     Distance 580 feet     Walk Time 6 minutes     # of Rest Breaks 0     MPH 1.09     METS 2.88     RPE 11     Perceived Dyspnea  0     VO2 Peak 10.11     Symptoms Yes (comment)     Comments Left leg sore     Resting HR 80 bpm     Resting BP 100/64     Resting Oxygen Saturation  99 %     Exercise Oxygen Saturation  during 6 min walk 96 %     Max Ex. HR 88 bpm     Max Ex. BP 114/64     2 Minute Post BP 96/62              Oxygen Initial Assessment:   Oxygen Re-Evaluation:   Oxygen Discharge (Final Oxygen Re-Evaluation):   Initial Exercise Prescription:  Initial Exercise Prescription - 01/22/21 1100       Date of Initial Exercise RX and Referring  Provider   Date 01/22/21    Referring Provider Harrel Lemon MD      Oxygen   Maintain Oxygen Saturation 88% or higher      Recumbant Bike   Level 1    RPM 60    Watts 15    Minutes 15    METs 2.8      NuStep   Level 1    SPM 80    Minutes 15    METs 2.8      Biostep-RELP   Level 1    SPM 50    Minutes 15    METs 2.8      Track   Laps 14    Minutes 15    METs 1.76      Prescription Details   Frequency (times per week) 3    Duration Progress to 30 minutes of continuous aerobic without signs/symptoms of physical distress      Intensity   THRR 40-80% of Max Heartrate 115-151    Ratings of Perceived Exertion 11-13    Perceived Dyspnea 0-4      Progression   Progression Continue to progress workloads to maintain intensity without signs/symptoms of physical distress.      Resistance Training   Training Prescription Yes    Weight 2 lb    Reps 10-15             Perform Capillary Blood Glucose checks as needed.  Exercise Prescription Changes:   Exercise Prescription Changes     Row Name 01/22/21 1100 02/09/21 1000 02/24/21 1300 03/02/21 1400       Response to Exercise   Blood Pressure (Admit) 100/64 122/62 182/78 --    Blood Pressure  (Exercise) 114/64 124/60 142/76 --    Blood Pressure (Exit) 96/62 122/60 148/80 --    Heart Rate (Admit) 80 bpm 80 bpm 75 bpm --    Heart Rate (Exercise) 88 bpm 83 bpm 83 bpm --    Heart Rate (Exit) 78 bpm 76 bpm 76 bpm --    Oxygen Saturation (Admit) 99 % -- -- --    Oxygen Saturation (Exercise) 96 % -- -- --    Rating of Perceived Exertion (Exercise) 11 13 12  --    Perceived Dyspnea (Exercise) 0 -- -- --    Symptoms Left leg sore none none --    Comments walk test results 2nd full day of exercise -- --    Duration -- Progress to 30 minutes of  aerobic without signs/symptoms of physical distress Progress to 30 minutes of  aerobic without signs/symptoms of physical distress --    Intensity -- THRR unchanged THRR unchanged --      Progression   Progression -- Continue to progress workloads to maintain intensity without signs/symptoms of physical distress. Continue to progress workloads to maintain intensity without signs/symptoms of physical distress. --    Average METs -- 1.42 1.75 --      Resistance Training   Training Prescription -- Yes Yes --    Weight -- 2 lb 2 lb --    Reps -- 10-15 10-15 --      Interval Training   Interval Training -- No No --      NuStep   Level -- 2 2 --    Minutes -- 30 30 --    METs -- 1.7 1.75 --      Biostep-RELP   Level -- 1 -- --    Minutes -- 30 -- --  METs -- 1.5 -- --      Home Exercise Plan   Plans to continue exercise at -- -- -- [employer] (comment)    Frequency -- -- -- Add 2 additional days to program exercise sessions.    Initial Home Exercises Provided -- -- -- 03/02/21             Exercise Comments:   Exercise Comments     Row Name 01/28/21 1334 01/28/21 1340         Exercise Comments -- Patient unable to exercise today due to BG level being too high (352). Marcene Brawn, EP, provided education related to monitoring blood sugar and what is required to be allowed to exercise. Patient stated understanding.                Exercise Goals and Review:   Exercise Goals     Row Name 01/22/21 1154             Exercise Goals   Increase Physical Activity Yes       Intervention Provide advice, education, support and counseling about physical activity/exercise needs.;Develop an individualized exercise prescription for aerobic and resistive training based on initial evaluation findings, risk stratification, comorbidities and participant's personal goals.       Expected Outcomes Short Term: Attend rehab on a regular basis to increase amount of physical activity.;Long Term: Add in home exercise to make exercise part of routine and to increase amount of physical activity.;Long Term: Exercising regularly at least 3-5 days a week.       Increase Strength and Stamina Yes       Intervention Provide advice, education, support and counseling about physical activity/exercise needs.;Develop an individualized exercise prescription for aerobic and resistive training based on initial evaluation findings, risk stratification, comorbidities and participant's personal goals.       Expected Outcomes Short Term: Increase workloads from initial exercise prescription for resistance, speed, and METs.;Short Term: Perform resistance training exercises routinely during rehab and add in resistance training at home;Long Term: Improve cardiorespiratory fitness, muscular endurance and strength as measured by increased METs and functional capacity (6MWT)       Able to understand and use rate of perceived exertion (RPE) scale Yes       Intervention Provide education and explanation on how to use RPE scale       Expected Outcomes Short Term: Able to use RPE daily in rehab to express subjective intensity level;Long Term:  Able to use RPE to guide intensity level when exercising independently       Able to understand and use Dyspnea scale Yes       Intervention Provide education and explanation on how to use Dyspnea scale       Expected Outcomes  Short Term: Able to use Dyspnea scale daily in rehab to express subjective sense of shortness of breath during exertion;Long Term: Able to use Dyspnea scale to guide intensity level when exercising independently       Knowledge and understanding of Target Heart Rate Range (THRR) Yes       Intervention Provide education and explanation of THRR including how the numbers were predicted and where they are located for reference       Expected Outcomes Short Term: Able to state/look up THRR;Long Term: Able to use THRR to govern intensity when exercising independently;Short Term: Able to use daily as guideline for intensity in rehab       Able to check pulse independently Yes  Intervention Provide education and demonstration on how to check pulse in carotid and radial arteries.;Review the importance of being able to check your own pulse for safety during independent exercise       Expected Outcomes Short Term: Able to explain why pulse checking is important during independent exercise;Long Term: Able to check pulse independently and accurately       Understanding of Exercise Prescription Yes       Intervention Provide education, explanation, and written materials on patient's individual exercise prescription       Expected Outcomes Short Term: Able to explain program exercise prescription;Long Term: Able to explain home exercise prescription to exercise independently       Improve claudication pain toleration; Improve walking ability Yes       Intervention Attend education sessions to aid in risk factor modification and understanding of disease process       Expected Outcomes Short Term: Improve walking distance/time to onset of claudication pain;Long Term: Improve walking ability and toleration to claudication                Exercise Goals Re-Evaluation :  Exercise Goals Re-Evaluation     Row Name 01/28/21 1334 02/09/21 1043 02/24/21 1327 03/02/21 1431       Exercise Goal Re-Evaluation    Exercise Goals Review -- Increase Physical Activity;Increase Strength and Stamina Increase Physical Activity;Increase Strength and Stamina Increase Physical Activity;Increase Strength and Stamina;Able to understand and use rate of perceived exertion (RPE) scale;Knowledge and understanding of Target Heart Rate Range (THRR)    Comments -- Reve has not been able to attend rehab consistently due to having higher blood sugars. She has been in contact with her doctor about it. She has only been able to exercise twice since starting and has tolerated each machine for the full 30 minutes so far. Will continue to monitor. Zalia tried walking in class.  She has been only doing seated equipment.  Staff will continue to monitor progress. Reviewed home exercise with pt today.  Pt plans to use staff videos and consider gym for exercise.  Reviewed THR, pulse, RPE, sign and symptoms, pulse oximetery and when to call 911 or MD.  Also discussed weather considerations and indoor options.  Pt voiced understanding.    Expected Outcomes -- Short: Maintain regular blood sugars Long: Maintain good control of diabetes and exercise and improve overall strength Short:  attend consistently  Long:  improve overall stamina Short: add one day to program sessions Long: exercise independently             Discharge Exercise Prescription (Final Exercise Prescription Changes):  Exercise Prescription Changes - 03/02/21 1400       Home Exercise Plan   Plans to continue exercise at [facility] (comment)    Frequency Add 2 additional days to program exercise sessions.    Initial Home Exercises Provided 03/02/21             Nutrition:  Target Goals: Understanding of nutrition guidelines, daily intake of sodium 1500mg , cholesterol 200mg , calories 30% from fat and 7% or less from saturated fats, daily to have 5 or more servings of fruits and vegetables.  Education: All About Nutrition: -Group instruction provided by  verbal, written material, interactive activities, discussions, models, and posters to present general guidelines for heart healthy nutrition including fat, fiber, MyPlate, the role of sodium in heart healthy nutrition, utilization of the nutrition label, and utilization of this knowledge for meal planning. Follow up email sent  as well. Written material given at graduation. Flowsheet Row Cardiac Rehab from 03/11/2021 in [facility] Cardiac and Pulmonary Rehab  Education need identified 01/22/21       Biometrics:  Pre Biometrics - 01/22/21 1109       Pre Biometrics   Height 5' 4.25" (1.632 m)    Weight 107 lb 14.4 oz (48.9 kg)    BMI (Calculated) 18.38              Nutrition Therapy Plan and Nutrition Goals:  Nutrition Therapy & Goals - 02/02/21 1324       Nutrition Therapy   Diet Heart healthy, low Na, T2DM, high kcal/high protein    Protein (specify units) 65-80g    Fiber 30 grams    Whole Grain Foods 3 servings    Saturated Fats 16 max. grams    Fruits and Vegetables 8 servings/day    Sodium 2 grams      Personal Nutrition Goals   Nutrition Goal ST: 2-4 CHO per meals, 1-2 CHO snacks. Pair meals and snacks with fiber, fat, and protein. Switch to low Na deli meat, baked chicken, tuna instead of high Na deli meat.  LT: limit Na <2g/day, meet kcal and protein needs, become independent with CHO counting.    Comments 51 y.o. F admitted to rehab for NSTEMI. Presents with T2DM A1C 12.7, HTN. Relevant medications incluse lipitor, lasix, novolog, lantus, vit D. Maranatha has been trying to gain weight and is now up to 118.6lbs as of today (115lbs 12/22/20). PYP 78. Unable to get food recall other than shredded wheat or oatmeal with lactose free 1 or 2% milk and broccoli with corn. Xariah reports her appetite has improved since quitting smoking during hospitalization 12/09/20. Her BG has been high in 300s and could not exercise on her first day of rehab due to high BG - saw MD this past friday, now  taking short acting insulin at meal times (she is unsure of the name). Discussed diabetes MNT as well as some heart healthy and high kcal/protein MNT. Will continue to follow-up and review education as needed.      Intervention Plan   Intervention Prescribe, educate and counsel regarding individualized specific dietary modifications aiming towards targeted core components such as weight, hypertension, lipid management, diabetes, heart failure and other comorbidities.    Expected Outcomes Short Term Goal: Understand basic principles of dietary content, such as calories, fat, sodium, cholesterol and nutrients.;Short Term Goal: A plan has been developed with personal nutrition goals set during dietitian appointment.;Long Term Goal: Adherence to prescribed nutrition plan.             Nutrition Assessments:  MEDIFICTS Score Key: ?70 Need to make dietary changes  40-70 Heart Healthy Diet ? 40 Therapeutic Level Cholesterol Diet  Flowsheet Row Cardiac Rehab from 01/22/2021 in [facility] Cardiac and Pulmonary Rehab  Picture Your Plate Total Score on Admission 78      Picture Your Plate Scores: <11 Unhealthy dietary pattern with much room for improvement. 41-50 Dietary pattern unlikely to meet recommendations for good health and room for improvement. 51-60 More healthful dietary pattern, with some room for improvement.  >60 Healthy dietary pattern, although there may be some specific behaviors that could be improved.    Nutrition Goals Re-Evaluation:  Nutrition Goals Re-Evaluation     Row Name 03/02/21 1421             Goals   Nutrition Goal Aletta has been doing some of the changes recommended  by RD.  She is not eating sweets as much.       Expected Outcome Short: continue to work on heart healthy diet Long:  reach weight goal and eat heart healthy                Nutrition Goals Discharge (Final Nutrition Goals Re-Evaluation):  Nutrition Goals Re-Evaluation - 03/02/21 1421        Goals   Nutrition Goal Kimberlynn has been doing some of the changes recommended by RD.  She is not eating sweets as much.    Expected Outcome Short: continue to work on heart healthy diet Long:  reach weight goal and eat heart healthy             Psychosocial: Target Goals: Acknowledge presence or absence of significant depression and/or stress, maximize coping skills, provide positive support system. Participant is able to verbalize types and ability to use techniques and skills needed for reducing stress and depression.   Education: Stress, Anxiety, and Depression - Group verbal and visual presentation to define topics covered.  Reviews how body is impacted by stress, anxiety, and depression.  Also discusses healthy ways to reduce stress and to treat/manage anxiety and depression.  Written material given at graduation. Flowsheet Row Cardiac Rehab from 03/11/2021 in [facility] Cardiac and Pulmonary Rehab  Date 03/11/21  Educator AS  Instruction Review Code 1- Verbalizes Understanding       Education: Sleep Hygiene -Provides group verbal and written instruction about how sleep can affect your health.  Define sleep hygiene, discuss sleep cycles and impact of sleep habits. Review good sleep hygiene tips.    Initial Review & Psychosocial Screening:  Initial Psych Review & Screening - 12/31/20 1038       Initial Review   Current issues with None Identified      Family Dynamics   Good Support System? Yes   son, boyfriend     Barriers   Psychosocial barriers to participate in program There are no identifiable barriers or psychosocial needs.;The patient should benefit from training in stress management and relaxation.      Screening Interventions   Interventions Encouraged to exercise;Provide feedback about the scores to participant;To provide support and resources with identified psychosocial needs    Expected Outcomes Short Term goal: Utilizing psychosocial counselor, staff and physician to  assist with identification of specific Stressors or current issues interfering with healing process. Setting desired goal for each stressor or current issue identified.;Long Term Goal: Stressors or current issues are controlled or eliminated.;Short Term goal: Identification and review with participant of any Quality of Life or Depression concerns found by scoring the questionnaire.;Long Term goal: The participant improves quality of Life and PHQ9 Scores as seen by post scores and/or verbalization of changes             Quality of Life Scores:   Quality of Life - 01/22/21 1124       Quality of Life   Select Quality of Life      Quality of Life Scores   Health/Function Pre 24 %    Socioeconomic Pre 26.25 %    Psych/Spiritual Pre 30 %    Family Pre 28.8 %    GLOBAL Pre 26.4 %            Scores of 19 and below usually indicate a poorer quality of life in these areas.  A difference of  2-3 points is a clinically meaningful difference.  A difference of 2-3  points in the total score of the Quality of Life Index has been associated with significant improvement in overall quality of life, self-image, physical symptoms, and general health in studies assessing change in quality of life.  PHQ-9: Recent Review Flowsheet Data     Depression screen [facility] 2/9 01/22/2021   Decreased Interest 0   Down, Depressed, Hopeless 0   PHQ - 2 Score 0   Altered sleeping 1   Tired, decreased energy 1   Change in appetite 0   Feeling bad or failure about yourself  0   Trouble concentrating 0   Moving slowly or fidgety/restless 0   Suicidal thoughts 0   PHQ-9 Score 2   Difficult doing work/chores Somewhat difficult      Interpretation of Total Score  Total Score Depression Severity:  1-4 = Minimal depression, 5-9 = Mild depression, 10-14 = Moderate depression, 15-19 = Moderately severe depression, 20-27 = Severe depression   Psychosocial Evaluation and Intervention:  Psychosocial Evaluation -  12/31/20 1051       Psychosocial Evaluation & Interventions   Interventions Encouraged to exercise with the program and follow exercise prescription    Comments Shakeena reports doing well post NSTEMI and stent. She also received stents in her leg for PAD and states she still has some swelling in her left leg but for the most part can tell a huge positive difference in her symptoms. She is very motivated to change her lifestyle habits for the better. She has started eating heatlhier and paying attention to her diabetes more. She quit smoking the day of her heart attack. She plans on returning to work once the doctors clear her to do so. She states she doesn't have any current issues with stress, and feels very supported by her boyfriend and son.    Expected Outcomes Short: attend cardiac rehab for education and exercise. Long: develop and maintain positive self care habits.    Continue Psychosocial Services  Follow up required by staff             Psychosocial Re-Evaluation:  Psychosocial Re-Evaluation     [place] Name 03/02/21 1419             Psychosocial Re-Evaluation   Current issues with Current Stress Concerns;Current Sleep Concerns       Comments Brytney has gone back to work part time.  She has a couple more weeks before going back full time.  She says she sleeps somewhat well - he feet wake her up sometimes.       Expected Outcomes Short:  continue to exercise to help with stress Long: maintain positive outlook                Psychosocial Discharge (Final Psychosocial Re-Evaluation):  Psychosocial Re-Evaluation - 03/02/21 1419       Psychosocial Re-Evaluation   Current issues with Current Stress Concerns;Current Sleep Concerns    Comments Xavia has gone back to work part time.  She has a couple more weeks before going back full time.  She says she sleeps somewhat well - he feet wake her up sometimes.    Expected Outcomes Short:  continue to exercise to help with stress Long:  maintain positive outlook             Vocational Rehabilitation: Provide vocational rehab assistance to qualifying candidates.   Vocational Rehab Evaluation & Intervention:  Vocational Rehab - 12/31/20 1038       Initial Vocational Rehab Evaluation & Intervention  Assessment shows need for Vocational Rehabilitation No             Education: Education Goals: Education classes will be provided on a variety of topics geared toward better understanding of heart health and risk factor modification. Participant will state understanding/return demonstration of topics presented as noted by education test scores.  Learning Barriers/Preferences:  Learning Barriers/Preferences - 12/31/20 1038       Learning Barriers/Preferences   Learning Barriers None    Learning Preferences None             General Cardiac Education Topics:  AED/CPR: - Group verbal and written instruction with the use of models to demonstrate the basic use of the AED with the basic ABC's of resuscitation.   Anatomy and Cardiac Procedures: - Group verbal and visual presentation and models provide information about basic cardiac anatomy and function. Reviews the testing methods done to diagnose heart disease and the outcomes of the test results. Describes the treatment choices: Medical Management, Angioplasty, or Coronary Bypass Surgery for treating various heart conditions including Myocardial Infarction, Angina, Valve Disease, and Cardiac Arrhythmias.  Written material given at graduation. Flowsheet Row Cardiac Rehab from 03/11/2021 in [facility] Cardiac and Pulmonary Rehab  Education need identified 01/22/21  Date 01/28/21  Educator [place]  Instruction Review Code 1- Verbalizes Understanding       Medication Safety: - Group verbal and visual instruction to review commonly prescribed medications for heart and lung disease. Reviews the medication, class of the drug, and side effects. Includes the steps to  properly store meds and maintain the prescription regimen.  Written material given at graduation. Flowsheet Row Cardiac Rehab from 03/11/2021 in [facility] Cardiac and Pulmonary Rehab  Date 02/11/21  Educator [facility]  Instruction Review Code 1- Verbalizes Understanding       Intimacy: - Group verbal instruction through game format to discuss how heart and lung disease can affect sexual intimacy. Written material given at graduation..   Know Your Numbers and Heart Failure: - Group verbal and visual instruction to discuss disease risk factors for cardiac and pulmonary disease and treatment options.  Reviews associated critical values for Overweight/Obesity, Hypertension, Cholesterol, and Diabetes.  Discusses basics of heart failure: signs/symptoms and treatments.  Introduces Heart Failure Zone chart for action plan for heart failure.  Written material given at graduation. Flowsheet Row Cardiac Rehab from 03/11/2021 in [facility] Cardiac and Pulmonary Rehab  Date 02/25/21  Educator [facility]  Instruction Review Code 1- Verbalizes Understanding       Infection Prevention: - Provides verbal and written material to individual with discussion of infection control including proper hand washing and proper equipment cleaning during exercise session. Flowsheet Row Cardiac Rehab from 03/11/2021 in [facility] Cardiac and Pulmonary Rehab  Education need identified 01/22/21  Date 01/22/21  Educator [place]  Instruction Review Code 1- Verbalizes Understanding       Falls Prevention: - Provides verbal and written material to individual with discussion of falls prevention and safety. Flowsheet Row Cardiac Rehab from 03/11/2021 in [facility] Cardiac and Pulmonary Rehab  Education need identified 01/22/21  Date 01/22/21  Educator [place]  Instruction Review Code 1- Verbalizes Understanding       Other: -Provides group and verbal instruction on various topics (see comments)   Knowledge Questionnaire Score:  Knowledge Questionnaire Score  - 01/22/21 1121       Knowledge Questionnaire Score   Pre Score 21/26: HTN, Angina, Nutrition, Exercise, Smoking  Core Components/Risk Factors/Patient Goals at Admission:  Personal Goals and Risk Factors at Admission - 01/22/21 1156       Core Components/Risk Factors/Patient Goals on Admission    Weight Management Yes;Weight Gain    Intervention Weight Management: Develop a combined nutrition and exercise program designed to reach desired caloric intake, while maintaining appropriate intake of nutrient and fiber, sodium and fats, and appropriate energy expenditure required for the weight goal.;Weight Management: Provide education and appropriate resources to help participant work on and attain dietary goals.;Weight Management/Obesity: Establish reasonable short term and long term weight goals.    Admit Weight 107 lb (48.5 kg)    Goal Weight: Short Term 109 lb (49.4 kg)    Goal Weight: Long Term 115 lb (52.2 kg)    Expected Outcomes Short Term: Continue to assess and modify interventions until short term weight is achieved;Long Term: Adherence to nutrition and physical activity/exercise program aimed toward attainment of established weight goal;Understanding recommendations for meals to include 15-35% energy as protein, 25-35% energy from fat, 35-60% energy from carbohydrates, less than 200mg  of dietary cholesterol, 20-35 gm of total fiber daily;Understanding of distribution of calorie intake throughout the day with the consumption of 4-5 meals/snacks;Weight Gain: Understanding of general recommendations for a high calorie, high protein meal plan that promotes weight gain by distributing calorie intake throughout the day with the consumption for 4-5 meals, snacks, and/or supplements    Tobacco Cessation Yes    Number of packs per day quit 12/09/20    Intervention Offer self-teaching materials, assist with locating and accessing local/national Quit Smoking programs, and support quit  date choice.    Expected Outcomes Short Term: Will quit all tobacco product use, adhering to prevention of relapse plan.;Long Term: Complete abstinence from all tobacco products for at least 12 months from quit date.    Diabetes Yes    Intervention Provide education about signs/symptoms and action to take for hypo/hyperglycemia.;Provide education about proper nutrition, including hydration, and aerobic/resistive exercise prescription along with prescribed medications to achieve blood glucose in normal ranges: Fasting glucose 65-99 mg/dL    Expected Outcomes Short Term: Participant verbalizes understanding of the signs/symptoms and immediate care of hyper/hypoglycemia, proper foot care and importance of medication, aerobic/resistive exercise and nutrition plan for blood glucose control.;Long Term: Attainment of HbA1C < 7%.    Hypertension Yes    Intervention Provide education on lifestyle modifcations including regular physical activity/exercise, weight management, moderate sodium restriction and increased consumption of fresh fruit, vegetables, and low fat dairy, alcohol moderation, and smoking cessation.;Monitor prescription use compliance.    Expected Outcomes Short Term: Continued assessment and intervention until BP is < 140/42mm HG in hypertensive participants. < 130/67mm HG in hypertensive participants with diabetes, heart failure or chronic kidney disease.;Long Term: Maintenance of blood pressure at goal levels.             Education:Diabetes - Individual verbal and written instruction to review signs/symptoms of diabetes, desired ranges of glucose level fasting, after meals and with exercise. Acknowledge that pre and post exercise glucose checks will be done for 3 sessions at entry of program. [place] from 03/11/2021 in [facility] Cardiac and Pulmonary Rehab  Education need identified 01/22/21  Date 01/22/21  Educator [place]  Instruction Review Code 1- Verbalizes Understanding        Core Components/Risk Factors/Patient Goals Review:   Goals and Risk Factor Review     Row Name 03/02/21 1415  Core Components/Risk Factors/Patient Goals Review   Personal Goals Review Weight Management/Obesity;Tobacco Cessation;Diabetes;Hypertension       Review [place] remains tobacco free.  She has been checking BG at home - average FBG around 170.  She also checks BP at home.  Resting 110/62 today.  She is still trying to put on weight.  Her weight does fluctuate some with fluids.       Expected Outcomes Short: continue to monitor risk factors Long: maintain BG/BP at optimal levels                Core Components/Risk Factors/Patient Goals at Discharge (Final Review):   Goals and Risk Factor Review - 03/02/21 1415       Core Components/Risk Factors/Patient Goals Review   Personal Goals Review Weight Management/Obesity;Tobacco Cessation;Diabetes;Hypertension    Review [place] remains tobacco free.  She has been checking BG at home - average FBG around 170.  She also checks BP at home.  Resting 110/62 today.  She is still trying to put on weight.  Her weight does fluctuate some with fluids.    Expected Outcomes Short: continue to monitor risk factors Long: maintain BG/BP at optimal levels             ITP Comments:  ITP Comments     Row Name 12/31/20 1050 01/22/21 1109 01/22/21 1122 01/28/21 1333 01/28/21 1339   ITP Comments Initial telephone orientation completed. Diagnosis can be found in [facility] 11/15. EP orientation scheduled for Thursday 12/29 at 9:30. Completed 6MWT and gym orientation. Initial ITP created and sent for review to Dr. Emily Filbert,  Medical Director. Stormi has recently quit tobacco use within the last 6 months. Her quit date was on 12/09/20.  Intervention for relapse prevention was provided at the initial medical review. He was encouraged to continue to with tobacco cessation and was provided information on relapse prevention. Patient received  information about combination therapy, tobacco cessation classes, quit line, and quit smoking apps in case of a relapse. Patient demonstrated understanding of this material.Staff will continue to provide encouragement and follow up with the patient throughout the program. -- Patient unable to exercise today due to BG level being too high (352). Marcene Brawn, EP, provided education related to monitoring blood sugar and what is required to be allowed to exercise. Patient stated understanding.    [place] Name 02/02/21 1421 02/18/21 0931 03/18/21 0925       ITP Comments Completed initial RD consultation 30 Day review completed. Medical Director ITP review done, changes made as directed, and signed approval by Medical Director. 30 Day review completed. Medical Director ITP review done, changes made as directed, and signed approval by Medical Director.              Comments:

## 2021-03-18 NOTE — Progress Notes (Signed)
Daily Session Note  Patient Details  Name: Beth Carroll MRN: [id number] Date of Birth: [birth year] Referring Provider:   Flowsheet Row Cardiac Rehab from 01/22/2021 in [facility] Cardiac and Pulmonary Rehab  Referring Provider Harrel Lemon MD       Encounter Date: 03/18/2021  Check In:  Session Check In - 03/18/21 1445       Check-In   Supervising physician immediately available to respond to emergencies See telemetry face sheet for immediately available ER MD    Location [facility]-Cardiac & Pulmonary Rehab    Staff Present Birdie Sons, MPA, Nino Glow, MS, ASCM CEP, Exercise Physiologist;Joseph Tessie Fass, [place]    Virtual Visit No    Medication changes reported     No    Fall or balance concerns reported    No    Tobacco Cessation No Change    Warm-up and Cool-down Performed on first and last piece of equipment    Resistance Training Performed Yes    VAD Patient? No    PAD/SET Patient? No      Pain Assessment   Currently in Pain? No/denies                Social History   Tobacco Use  Smoking Status Former   Types: Cigarettes   Quit date: 12/09/2020   Years since quitting: 0.2  Smokeless Tobacco Never    Goals Met:  Independence with exercise equipment Exercise tolerated well No report of concerns or symptoms today Strength training completed today  Goals Unmet:  Not Applicable  Comments: Pt able to follow exercise prescription today without complaint.  Will continue to monitor for progression.    Dr. Emily Filbert is Medical Director for [place].  Dr. Ottie Glazier is Medical Director for [facility] Pulmonary Rehabilitation.

## 2021-03-19 LAB — BASIC METABOLIC PANEL
BUN/Creatinine Ratio: 24 — ABNORMAL HIGH (ref 9–23)
BUN: 15 mg/dL (ref 6–24)
CO2: 24 mmol/L (ref 20–29)
Calcium: 9.7 mg/dL (ref 8.7–10.2)
Chloride: 95 mmol/L — ABNORMAL LOW (ref 96–106)
Creatinine, Ser: 0.63 mg/dL (ref 0.57–1.00)
Glucose: 235 mg/dL — ABNORMAL HIGH (ref 70–99)
Potassium: 3.5 mmol/L (ref 3.5–5.2)
Sodium: 137 mmol/L (ref 134–144)
eGFR: 107 mL/min/{1.73_m2} (ref >59)

## 2021-03-22 IMAGING — CR DG KNEE COMPLETE 4+V*L*
1 series · 4 of 4 positions shown · non-contrast
Comparison: None.

CLINICAL DATA: Fall at work 2 days ago with left knee pain.

EXAM:
LEFT KNEE - COMPLETE 4+ VIEW

[Series 1: dg knee complete 4 views left · 0.14mm/px · 4 of 4 slices shown]
[im 1/4]
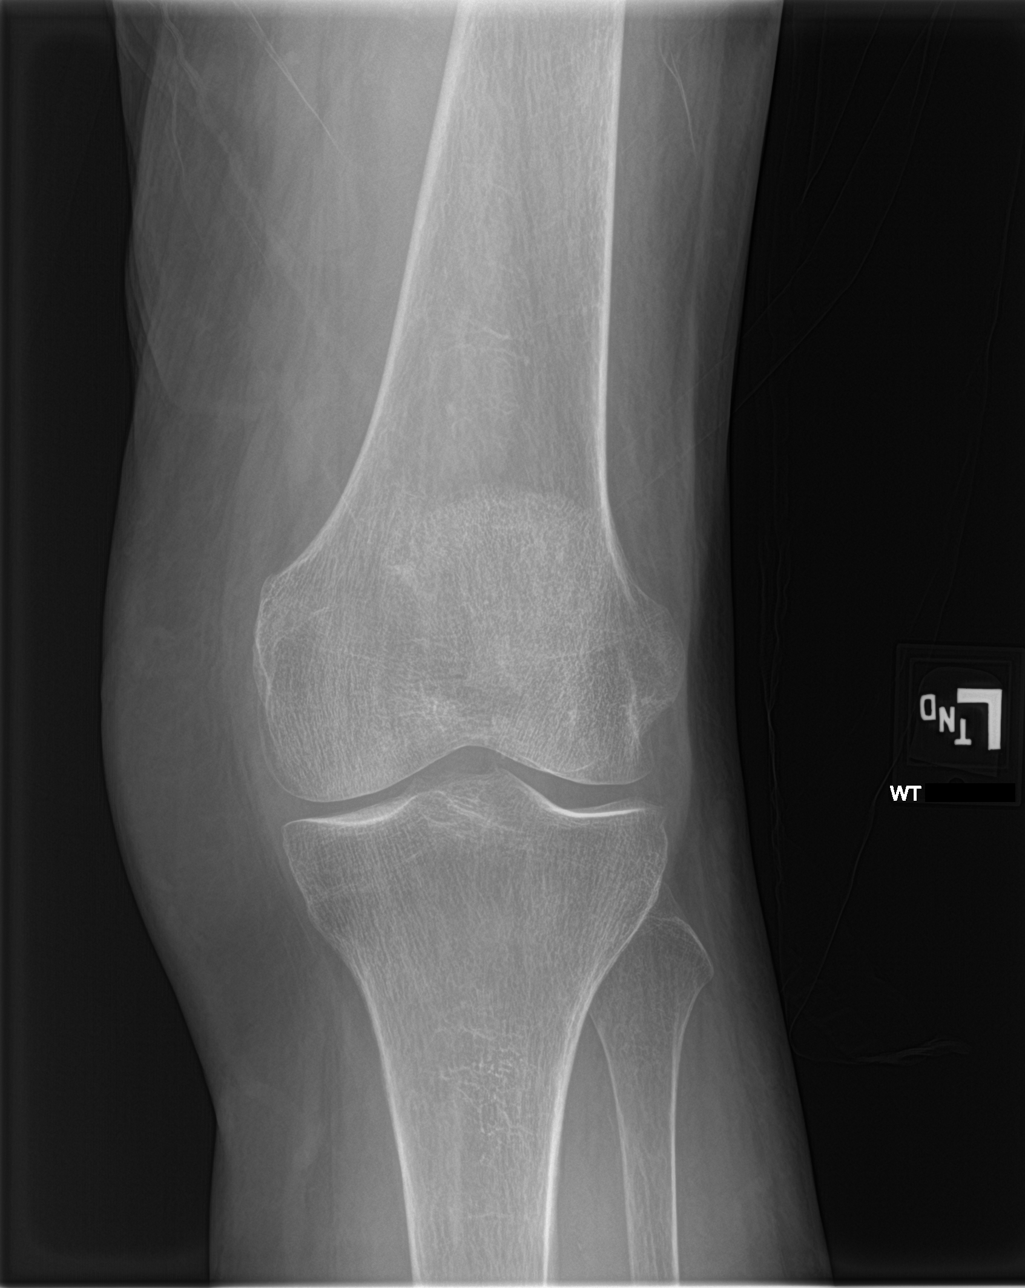
[im 2/4]
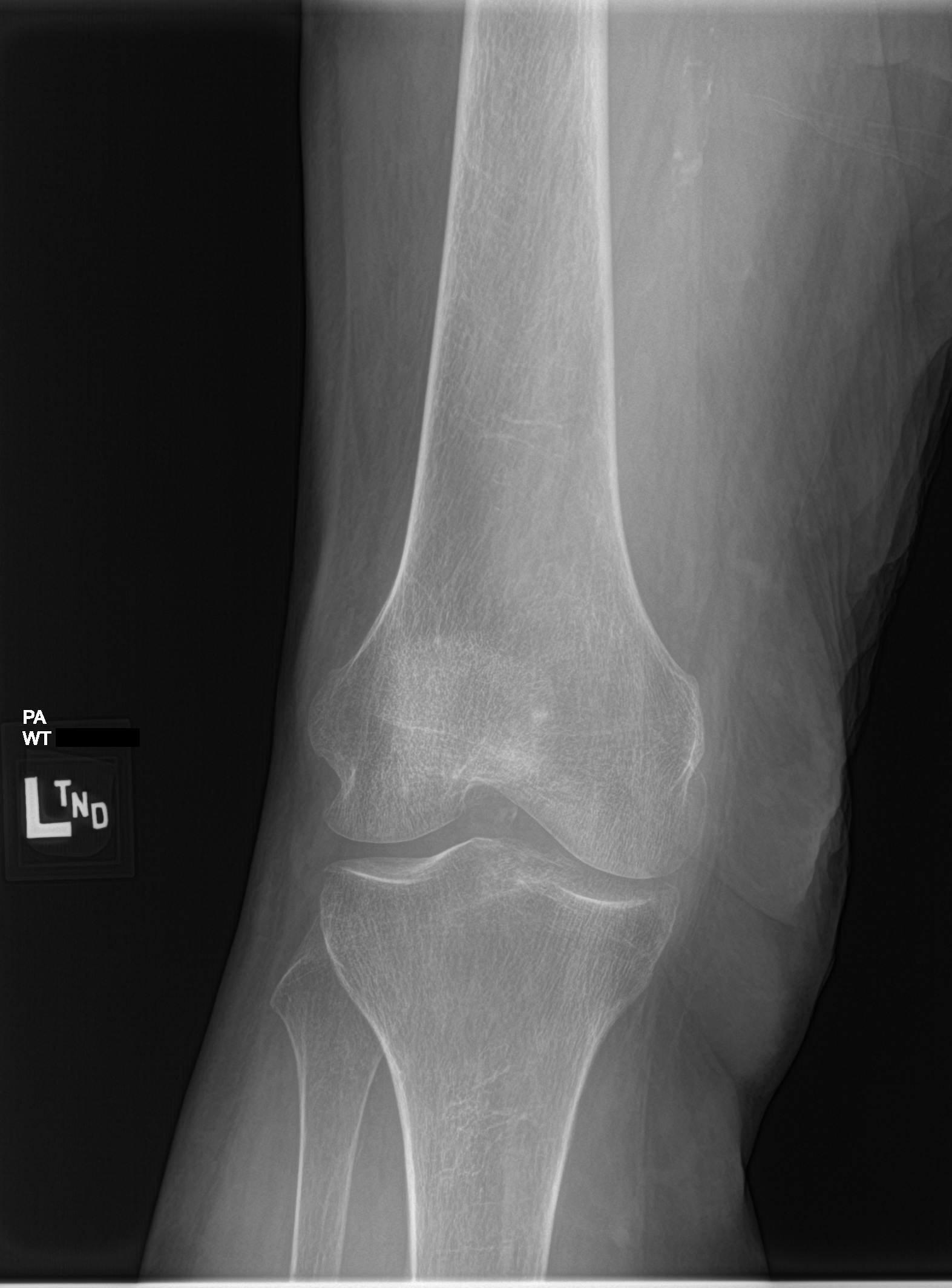
[im 3/4]
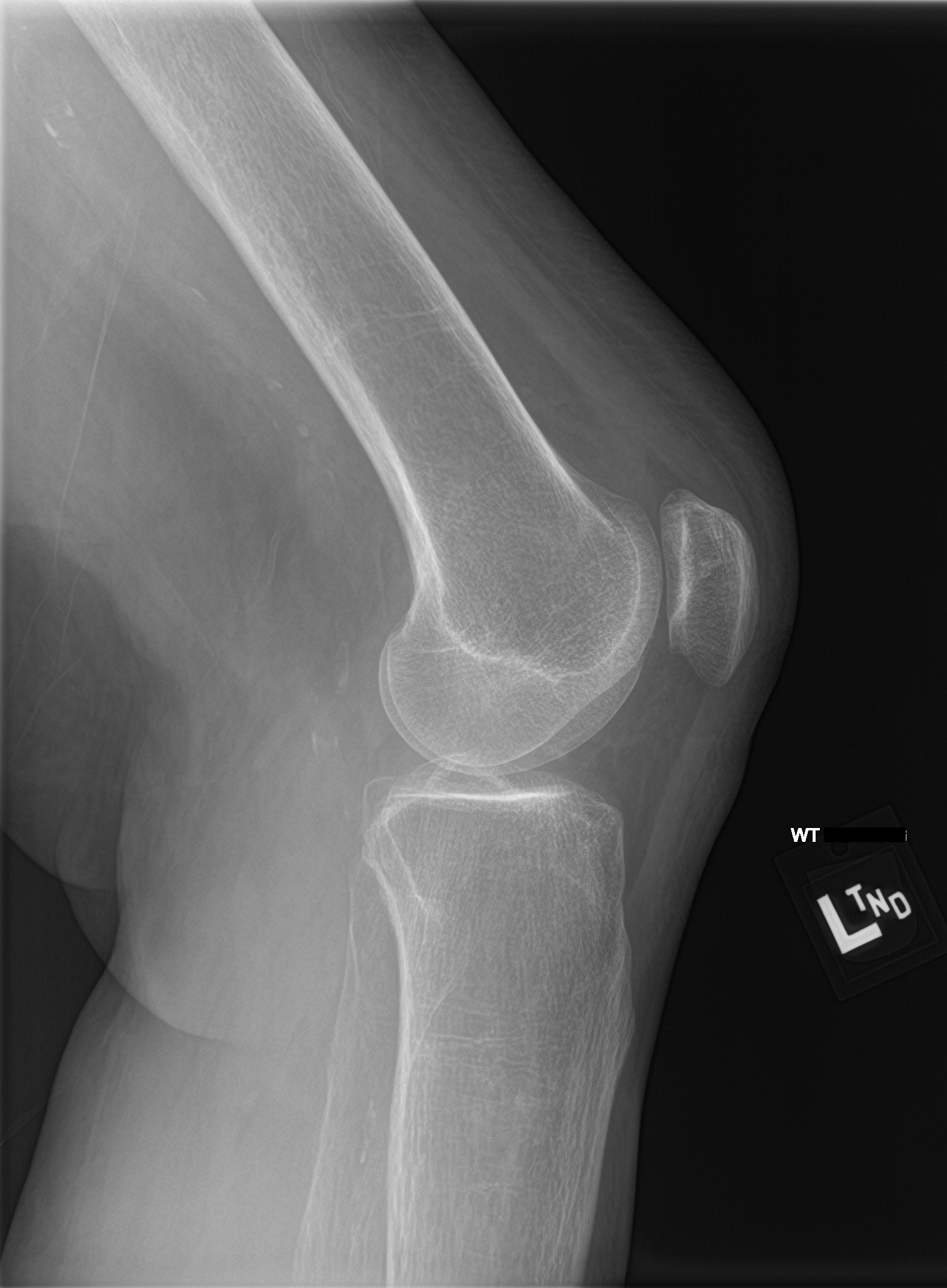
[im 4/4]
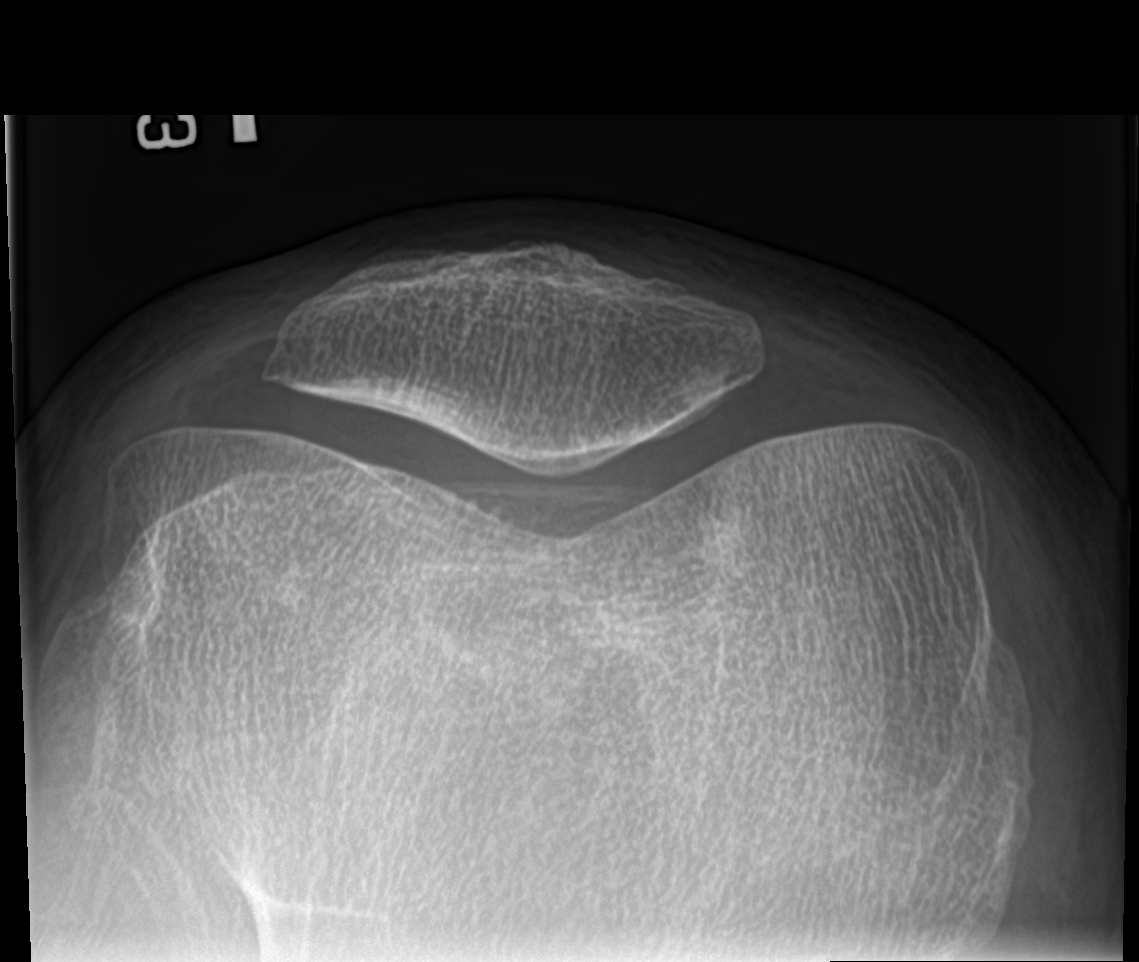

[4 of 4 positions shown; findings below may reference images not displayed]

FINDINGS: Mild diffuse decreased bone mineralization. No acute fracture or
dislocation. No significant joint effusion.
IMPRESSION: No acute findings.

## 2021-03-23 ENCOUNTER — Other Ambulatory Visit: Payer: Self-pay

## 2021-03-23 DIAGNOSIS — I252 Old myocardial infarction: Secondary | ICD-10-CM | POA: Diagnosis not present

## 2021-03-23 DIAGNOSIS — I214 Non-ST elevation (NSTEMI) myocardial infarction: Secondary | ICD-10-CM

## 2021-03-23 DIAGNOSIS — Z955 Presence of coronary angioplasty implant and graft: Secondary | ICD-10-CM

## 2021-03-23 NOTE — Progress Notes (Signed)
Daily Session Note  Patient Details  Name: Beth Carroll MRN: [id number] Date of Birth: February 13, 1970 Referring Provider:   Flowsheet Row Cardiac Rehab from 01/22/2021 in [facility] Cardiac and Pulmonary Rehab  Referring Provider Harrel Lemon MD       Encounter Date: 03/23/2021  Check In:  Session Check In - 03/23/21 1356       Check-In   Supervising physician immediately available to respond to emergencies See telemetry face sheet for immediately available ER MD    Location [facility]-Cardiac & Pulmonary Rehab    Staff Present Birdie Sons, MPA, Nino Glow, MS, ASCM CEP, Exercise Physiologist;Joseph Tessie Fass, [place]    Virtual Visit No    Medication changes reported     No    Fall or balance concerns reported    No    Tobacco Cessation No Change    Warm-up and Cool-down Performed on first and last piece of equipment    Resistance Training Performed Yes    VAD Patient? No    PAD/SET Patient? No      Pain Assessment   Currently in Pain? No/denies                Social History   Tobacco Use  Smoking Status Former   Types: Cigarettes   Quit date: 12/09/2020   Years since quitting: 0.2  Smokeless Tobacco Never    Goals Met:  Independence with exercise equipment Exercise tolerated well No report of concerns or symptoms today Strength training completed today  Goals Unmet:  Not Applicable  Comments: Pt able to follow exercise prescription today without complaint.  Will continue to monitor for progression.    Dr. Emily Filbert is Medical Director for [place].  Dr. Ottie Glazier is Medical Director for [facility] Pulmonary Rehabilitation.

## 2021-03-25 ENCOUNTER — Encounter: Payer: No Typology Code available for payment source | Attending: Internal Medicine

## 2021-03-25 ENCOUNTER — Other Ambulatory Visit: Payer: Self-pay

## 2021-03-25 ENCOUNTER — Ambulatory Visit: Payer: No Typology Code available for payment source | Admitting: Medical

## 2021-03-25 DIAGNOSIS — I214 Non-ST elevation (NSTEMI) myocardial infarction: Secondary | ICD-10-CM | POA: Insufficient documentation

## 2021-03-25 DIAGNOSIS — Z955 Presence of coronary angioplasty implant and graft: Secondary | ICD-10-CM | POA: Insufficient documentation

## 2021-03-25 NOTE — Progress Notes (Signed)
Daily Session Note ? ?Patient Details  ?Name: Beth Carroll ?MRN: [id number] ?Date of Birth: [birth year] ?Referring Provider:   ?Flowsheet Row Cardiac Rehab from 01/22/2021 in [facility] Cardiac and Pulmonary Rehab  ?Referring Provider Harrel Lemon MD  ? ?  ? ? ?Encounter Date: 03/25/2021 ? ?Check In: ? Session Check In - 03/25/21 1353   ? ?  ? Check-In  ? Supervising physician immediately available to respond to emergencies See telemetry face sheet for immediately available ER MD   ? Location [facility]-Cardiac & Pulmonary Rehab   ? Staff Present Birdie Sons, MPA, RN;Joseph [place], RCP,RRT,BSRT;Kara [place], MS, ASCM CEP, Exercise Physiologist   ? Virtual Visit No   ? Medication changes reported     No   ? Fall or balance concerns reported    No   ? Tobacco Cessation No Change   ? Warm-up and Cool-down Performed on first and last piece of equipment   ? Resistance Training Performed Yes   ? VAD Patient? No   ? PAD/SET Patient? No   ?  ? Pain Assessment  ? Currently in Pain? No/denies   ? ?  ?  ? ?  ? ? ? ? ? ?Social History  ? ?Tobacco Use  ?Smoking Status Former  ? Types: Cigarettes  ? Quit date: 12/09/2020  ? Years since quitting: 0.2  ?Smokeless Tobacco Never  ? ? ?Goals Met:  ?Independence with exercise equipment ?Exercise tolerated well ?No report of concerns or symptoms today ?Strength training completed today ? ?Goals Unmet:  ?Not Applicable ? ?Comments: Pt able to follow exercise prescription today without complaint.  Will continue to monitor for progression. ? ? ? ?Dr. Emily Filbert is Medical Director for [place].  ?Dr. Ottie Glazier is Medical Director for [facility] Pulmonary Rehabilitation. ?

## 2021-03-26 ENCOUNTER — Encounter: Payer: No Typology Code available for payment source | Admitting: *Deleted

## 2021-03-26 ENCOUNTER — Other Ambulatory Visit: Payer: Self-pay

## 2021-03-26 DIAGNOSIS — I214 Non-ST elevation (NSTEMI) myocardial infarction: Secondary | ICD-10-CM

## 2021-03-26 DIAGNOSIS — Z955 Presence of coronary angioplasty implant and graft: Secondary | ICD-10-CM

## 2021-03-26 NOTE — Progress Notes (Signed)
Daily Session Note ? ?Patient Details  ?Name: Beth Carroll ?MRN: [id number] ?Date of Birth: 03/13/1970 ?Referring Provider:   ?Flowsheet Row Cardiac Rehab from 01/22/2021 in [facility] Cardiac and Pulmonary Rehab  ?Referring Provider [place], John MD  ? ?  ? ? ?Encounter Date: 03/26/2021 ? ?Check In: ? Session Check In - 03/26/21 1400   ? ?  ? Check-In  ? Supervising physician immediately available to respond to emergencies See telemetry face sheet for immediately available ER MD   ? Location [facility]-Cardiac & Pulmonary Rehab   ? Staff Present Meredith Craven, RN BSN;Joseph [place], RCP,RRT,BSRT;Jessica [place], MA, RCEP, CCRP, CCET   ? Virtual Visit No   ? Medication changes reported     No   ? Fall or balance concerns reported    No   ? Tobacco Cessation No Change   ? Current number of cigarettes/nicotine per day     0   ? Warm-up and Cool-down Performed on first and last piece of equipment   ? Resistance Training Performed Yes   ? VAD Patient? No   ? PAD/SET Patient? No   ?  ? Pain Assessment  ? Currently in Pain? No/denies   ? ?  ?  ? ?  ? ? ? ? ? ?Social History  ? ?Tobacco Use  ?Smoking Status Former  ? Types: Cigarettes  ? Quit date: 12/09/2020  ? Years since quitting: 0.2  ?Smokeless Tobacco Never  ? ? ?Goals Met:  ?Independence with exercise equipment ?Exercise tolerated well ?No report of concerns or symptoms today ?Strength training completed today ? ?Goals Unmet:  ?Not Applicable ? ?Comments: Pt able to follow exercise prescription today without complaint.  Will continue to monitor for progression. ? ? ? ?Dr. Mark Miller is Medical Director for [facility] Cardiac Rehabilitation.  ?Dr. Fuad Aleskerov is Medical Director for [facility] Pulmonary Rehabilitation. ?

## 2021-04-01 ENCOUNTER — Other Ambulatory Visit: Payer: Self-pay

## 2021-04-01 DIAGNOSIS — Z955 Presence of coronary angioplasty implant and graft: Secondary | ICD-10-CM

## 2021-04-01 DIAGNOSIS — I214 Non-ST elevation (NSTEMI) myocardial infarction: Secondary | ICD-10-CM

## 2021-04-01 NOTE — Progress Notes (Signed)
Daily Session Note ? ?Patient Details  ?Name: Beth Carroll ?MRN: [id number] ?Date of Birth: [birth year] ?Referring Provider:   ?Flowsheet Row Cardiac Rehab from 01/22/2021 in [facility] Cardiac and Pulmonary Rehab  ?Referring Provider Harrel Lemon MD  ? ?  ? ? ?Encounter Date: 04/01/2021 ? ?Check In: ? Session Check In - 04/01/21 1405   ? ?  ? Check-In  ? Supervising physician immediately available to respond to emergencies See telemetry face sheet for immediately available ER MD   ? Location [facility]-Cardiac & Pulmonary Rehab   ? Staff Present Birdie Sons, MPA, RN;Jessica Luan [place], MA, RCEP, CCRP, CCET;Joseph [place], [place]   ? Virtual Visit No   ? Medication changes reported     No   ? Fall or balance concerns reported    No   ? Tobacco Cessation No Change   ? Warm-up and Cool-down Performed on first and last piece of equipment   ? Resistance Training Performed Yes   ? VAD Patient? No   ? PAD/SET Patient? No   ?  ? Pain Assessment  ? Currently in Pain? No/denies   ? ?  ?  ? ?  ? ? ? ? ? ?Social History  ? ?Tobacco Use  ?Smoking Status Former  ? Types: Cigarettes  ? Quit date: 12/09/2020  ? Years since quitting: 0.3  ?Smokeless Tobacco Never  ? ? ?Goals Met:  ?Independence with exercise equipment ?Exercise tolerated well ?No report of concerns or symptoms today ?Strength training completed today ? ?Goals Unmet:  ?Not Applicable ? ?Comments: Pt able to follow exercise prescription today without complaint.  Will continue to monitor for progression. ? ? ? ?Dr. Emily Filbert is Medical Director for [place].  ?Dr. Ottie Glazier is Medical Director for [facility] Pulmonary Rehabilitation. ?

## 2021-04-02 ENCOUNTER — Other Ambulatory Visit: Payer: Self-pay

## 2021-04-02 ENCOUNTER — Encounter: Payer: No Typology Code available for payment source | Admitting: *Deleted

## 2021-04-02 DIAGNOSIS — Z955 Presence of coronary angioplasty implant and graft: Secondary | ICD-10-CM

## 2021-04-02 DIAGNOSIS — I214 Non-ST elevation (NSTEMI) myocardial infarction: Secondary | ICD-10-CM | POA: Diagnosis not present

## 2021-04-02 NOTE — Progress Notes (Signed)
Daily Session Note ? ?Patient Details  ?Name: Beth Carroll ?MRN: [id number] ?Date of Birth: 1970/11/23 ?Referring Provider:   ?Flowsheet Row Cardiac Rehab from 01/22/2021 in [facility] Cardiac and Pulmonary Rehab  ?Referring Provider Harrel Lemon MD  ? ?  ? ? ?Encounter Date: 04/02/2021 ? ?Check In: ? Session Check In - 04/02/21 1406   ? ?  ? Check-In  ? Supervising physician immediately available to respond to emergencies See telemetry face sheet for immediately available ER MD   ? Location [facility]-Cardiac & Pulmonary Rehab   ? Staff Present Renita Papa, RN BSN;Joseph [place], RCP,RRT,BSRT;Jessica [place], [place], [place], [place], CCET   ? Virtual Visit No   ? Medication changes reported     No   ? Fall or balance concerns reported    No   ? Tobacco Cessation No Change   ? Current number of cigarettes/nicotine per day     0   ? Warm-up and Cool-down Performed on first and last piece of equipment   ? Resistance Training Performed Yes   ? VAD Patient? No   ? PAD/SET Patient? No   ?  ? Pain Assessment  ? Currently in Pain? No/denies   ? ?  ?  ? ?  ? ? ? ? ? ?Social History  ? ?Tobacco Use  ?Smoking Status Former  ? Types: Cigarettes  ? Quit date: 12/09/2020  ? Years since quitting: 0.3  ?Smokeless Tobacco Never  ? ? ?Goals Met:  ?Independence with exercise equipment ?Exercise tolerated well ?No report of concerns or symptoms today ?Strength training completed today ? ?Goals Unmet:  ?Not Applicable ? ?Comments: Pt able to follow exercise prescription today without complaint.  Will continue to monitor for progression. ? ? ? ?Dr. Emily Filbert is Medical Director for [place].  ?Dr. Ottie Glazier is Medical Director for [facility] Pulmonary Rehabilitation. ?

## 2021-04-06 ENCOUNTER — Other Ambulatory Visit: Payer: Self-pay

## 2021-04-06 DIAGNOSIS — I214 Non-ST elevation (NSTEMI) myocardial infarction: Secondary | ICD-10-CM | POA: Diagnosis not present

## 2021-04-06 DIAGNOSIS — Z955 Presence of coronary angioplasty implant and graft: Secondary | ICD-10-CM

## 2021-04-06 NOTE — Progress Notes (Signed)
Daily Session Note ? ?Patient Details  ?Name: Beth Carroll ?MRN: [id number] ?Date of Birth: 09/16/1970 ?Referring Provider:   ?Flowsheet Row Cardiac Rehab from 01/22/2021 in [facility] Cardiac and Pulmonary Rehab  ?Referring Provider Harrel Lemon MD  ? ?  ? ? ?Encounter Date: 04/06/2021 ? ?Check In: ? Session Check In - 04/06/21 1411   ? ?  ? Check-In  ? Supervising physician immediately available to respond to emergencies See telemetry face sheet for immediately available ER MD   ? Location [facility]-Cardiac & Pulmonary Rehab   ? Staff Present Birdie Sons, MPA, RN;Joseph [place], RCP,RRT,BSRT;Kara [place], MS, ASCM CEP, Exercise Physiologist   ? Virtual Visit No   ? Medication changes reported     No   ? Fall or balance concerns reported    No   ? Tobacco Cessation No Change   ? Warm-up and Cool-down Performed on first and last piece of equipment   ? Resistance Training Performed Yes   ? VAD Patient? No   ? PAD/SET Patient? No   ?  ? Pain Assessment  ? Currently in Pain? No/denies   ? ?  ?  ? ?  ? ? ? ? ? ?Social History  ? ?Tobacco Use  ?Smoking Status Former  ? Types: Cigarettes  ? Quit date: 12/09/2020  ? Years since quitting: 0.3  ?Smokeless Tobacco Never  ? ? ?Goals Met:  ?Independence with exercise equipment ?Exercise tolerated well ?No report of concerns or symptoms today ?Strength training completed today ? ?Goals Unmet:  ?Not Applicable ? ?Comments: Pt able to follow exercise prescription today without complaint.  Will continue to monitor for progression. ? ? ? ?Dr. Emily Filbert is Medical Director for [place].  ?Dr. Ottie Glazier is Medical Director for [facility] Pulmonary Rehabilitation. ?

## 2021-04-08 ENCOUNTER — Other Ambulatory Visit: Payer: Self-pay

## 2021-04-08 DIAGNOSIS — I214 Non-ST elevation (NSTEMI) myocardial infarction: Secondary | ICD-10-CM | POA: Diagnosis not present

## 2021-04-08 DIAGNOSIS — Z955 Presence of coronary angioplasty implant and graft: Secondary | ICD-10-CM

## 2021-04-08 NOTE — Progress Notes (Signed)
Daily Session Note ? ?Patient Details  ?Name: Beth Carroll ?MRN: [id number] ?Date of Birth: 04/10/1970 ?Referring Provider:   ?Flowsheet Row Cardiac Rehab from 01/22/2021 in [facility] Cardiac and Pulmonary Rehab  ?Referring Provider Harrel Lemon MD  ? ?  ? ? ?Encounter Date: 04/08/2021 ? ?Check In: ? Session Check In - 04/08/21 1402   ? ?  ? Check-In  ? Supervising physician immediately available to respond to emergencies See telemetry face sheet for immediately available ER MD   ? Location [facility]-Cardiac & Pulmonary Rehab   ? Staff Present Birdie Sons, MPA, RN;Joseph [place], RCP,RRT,BSRT;Laureen Owens Shark, BS, RRT, CPFT;Melissa [place], RDN, LDN;Jessica [place], MA, RCEP, CCRP, CCET   ? Virtual Visit No   ? Medication changes reported     No   ? Fall or balance concerns reported    No   ? Tobacco Cessation No Change   ? Warm-up and Cool-down Performed on first and last piece of equipment   ? Resistance Training Performed Yes   ? VAD Patient? No   ? PAD/SET Patient? No   ?  ? Pain Assessment  ? Currently in Pain? No/denies   ? ?  ?  ? ?  ? ? ? ? ? ?Social History  ? ?Tobacco Use  ?Smoking Status Former  ? Types: Cigarettes  ? Quit date: 12/09/2020  ? Years since quitting: 0.3  ?Smokeless Tobacco Never  ? ? ?Goals Met:  ?Independence with exercise equipment ?Exercise tolerated well ?No report of concerns or symptoms today ?Strength training completed today ? ?Goals Unmet:  ?Not Applicable ? ?Comments: Pt able to follow exercise prescription today without complaint.  Will continue to monitor for progression. ? ? ? ?Dr. Emily Filbert is Medical Director for [place].  ?Dr. Ottie Glazier is Medical Director for [facility] Pulmonary Rehabilitation. ?

## 2021-04-09 ENCOUNTER — Other Ambulatory Visit: Payer: Self-pay

## 2021-04-09 ENCOUNTER — Encounter: Payer: No Typology Code available for payment source | Admitting: *Deleted

## 2021-04-09 VITALS — Ht 64.25 in | Wt 115.9 lb

## 2021-04-09 DIAGNOSIS — I214 Non-ST elevation (NSTEMI) myocardial infarction: Secondary | ICD-10-CM | POA: Diagnosis not present

## 2021-04-09 NOTE — Patient Instructions (Signed)
Discharge Patient Instructions ? ?Patient Details  ?Name: Beth Carroll ?MRN: [id number] ?Date of Birth: January 09, 1971 ?Referring Provider:  Baxter Hire, MD ? ? ?Number of Visits: 9 ? ?Reason for Discharge:  ?Patient reached a stable level of exercise. ?Patient independent in their exercise. ?Patient has met program and personal goals. ? ?Smoking History:  ?Social History  ? ?Tobacco Use  ?Smoking Status Former  ? Types: Cigarettes  ? Quit date: 12/09/2020  ? Years since quitting: 0.3  ?Smokeless Tobacco Never  ? ? ?Diagnosis:  ?NSTEMI (non-ST elevated myocardial infarction) ([place]) ? ?Status post coronary artery stent placement ? ?Initial Exercise Prescription: ? Initial Exercise Prescription - 01/22/21 1100   ? ?  ? Date of Initial Exercise RX and Referring Provider  ? Date 01/22/21   ? Referring Provider Harrel Lemon MD   ?  ? Oxygen  ? Maintain Oxygen Saturation 88% or higher   ?  ? Recumbant Bike  ? Level 1   ? RPM 60   ? Watts 15   ? Minutes 15   ? METs 2.8   ?  ? NuStep  ? Level 1   ? SPM 80   ? Minutes 15   ? METs 2.8   ?  ? Biostep-RELP  ? Level 1   ? SPM 50   ? Minutes 15   ? METs 2.8   ?  ? Track  ? Laps 14   ? Minutes 15   ? METs 1.76   ?  ? Prescription Details  ? Frequency (times per week) 3   ? Duration Progress to 30 minutes of continuous aerobic without signs/symptoms of physical distress   ?  ? Intensity  ? THRR 40-80% of Max Heartrate 115-151   ? Ratings of Perceived Exertion 11-13   ? Perceived Dyspnea 0-4   ?  ? Progression  ? Progression Continue to progress workloads to maintain intensity without signs/symptoms of physical distress.   ?  ? Resistance Training  ? Training Prescription Yes   ? Weight 2 lb   ? Reps 10-15   ? ?  ?  ? ?  ? ? ?Discharge Exercise Prescription (Final Exercise Prescription Changes): ? Exercise Prescription Changes - 04/06/21 0900   ? ?  ? Response to Exercise  ? Blood Pressure (Admit) 132/70   ? Blood Pressure (Exit) 104/60   ? Heart Rate (Admit) 75 bpm   ? Heart Rate  (Exercise) 90 bpm   ? Heart Rate (Exit) 73 bpm   ? Rating of Perceived Exertion (Exercise) 12   ? Symptoms none   ? Duration Continue with 30 min of aerobic exercise without signs/symptoms of physical distress.   ? Intensity THRR unchanged   ?  ? Progression  ? Progression Continue to progress workloads to maintain intensity without signs/symptoms of physical distress.   ? Average METs 1.74   ?  ? Resistance Training  ? Training Prescription Yes   ? Weight 3 lb   ? Reps 10-15   ?  ? Interval Training  ? Interval Training No   ?  ? Treadmill  ? MPH 1.1   ? Grade 0.5   ? Minutes 15   ? METs 1.9   ?  ? NuStep  ? Level 3   ? Minutes 15   ? METs 2   ?  ? Biostep-RELP  ? Level 1   ? Minutes 15   ? METs 2   ?  ? Track  ?  Laps 18   ? Minutes 15   ? METs 1.98   ?  ? Home Exercise Plan  ? Plans to [facility] exercise at [facility] (comment)   ? Frequency Add 2 additional days to program exercise sessions.   ? Initial Home Exercises Provided 03/02/21   ? ?  ?  ? ?  ? ? ?Functional Capacity: ? 6 Minute Walk   ? ? [place] Name 01/22/21 1116 04/09/21 1420  ?  ?  ? 6 Minute Walk  ? Phase Initial Discharge   ? Distance 580 feet 880 feet   ? Distance % Change -- 51.7 %   ? Distance Feet Change -- 300 ft   ? Walk Time 6 minutes 6 minutes   ? # of Rest Breaks 0 0   ? MPH 1.09 1.67   ? METS 2.88 3.44   ? RPE 11 12   ? Perceived Dyspnea  0 --   ? VO2 Peak 10.11 12.05   ? Symptoms Yes (comment) No   ? Comments Left leg sore --   ? Resting HR 80 bpm 74 bpm   ? Resting BP 100/64 128/70   ? Resting Oxygen Saturation  99 % --   ? Exercise Oxygen Saturation  during 6 min walk 96 % --   ? Max Ex. HR 88 bpm 84 bpm   ? Max Ex. BP 114/64 136/70   ? 2 Minute Post BP 96/62 --   ? ?  ?  ? ?  ? ? ? ?Nutrition & Weight - Outcomes: ? Pre Biometrics - 01/22/21 1109   ? ?  ? Pre Biometrics  ? Height 5' 4.25" (1.632 m)   ? Weight 107 lb 14.4 oz (48.9 kg)   ? BMI (Calculated) 18.38   ? ?  ?  ? ?  ? ? Post Biometrics - 04/09/21 1421   ? ?  ?  Post   Biometrics  ? Height 5' 4.25" (1.632 m)   ? Weight 115 lb 14.4 oz (52.6 kg)   ? BMI (Calculated) 19.74   ? Single Leg Stand 13.6 seconds   ? ?  ?  ? ?  ? ? ?Nutrition: ? Nutrition Therapy & Goals - 02/02/21 1324   ? ?  ? Nutrition Therapy  ? Diet Heart healthy, low Na, T2DM, high kcal/high protein   ? Protein (specify units) 65-80g   ? Fiber 30 grams   ? Whole Grain Foods 3 servings   ? Saturated Fats 16 max. grams   ? Fruits and Vegetables 8 servings/day   ? Sodium 2 grams   ?  ? Personal Nutrition Goals  ? Nutrition Goal ST: 2-4 CHO per meals, 1-2 CHO snacks. Pair meals and snacks with fiber, fat, and protein. Switch to low Na deli meat, baked chicken, tuna instead of high Na deli meat.  LT: limit Na <2g/day, meet kcal and protein needs, become independent with CHO counting.   ? Comments 51 y.o. F admitted to rehab for NSTEMI. Presents with T2DM A1C 12.7, HTN. Relevant medications incluse lipitor, lasix, novolog, lantus, vit D. Adilyn has been trying to gain weight and is now up to 118.6lbs as of today (115lbs 12/22/20). PYP 78. Unable to get food recall other than shredded wheat or oatmeal with lactose free 1 or 2% milk and broccoli with corn. Sarya reports her appetite has improved since quitting smoking during hospitalization 12/09/20. Her BG has been high in 300s and could not exercise on  her first day of rehab due to high BG - saw MD this past friday, now taking short acting insulin at meal times (she is unsure of the name). Discussed diabetes MNT as well as some heart healthy and high kcal/protein MNT. Will continue to follow-up and review education as needed.   ?  ? Intervention Plan  ? Intervention Prescribe, educate and counsel regarding individualized specific dietary modifications aiming towards targeted core components such as weight, hypertension, lipid management, diabetes, heart failure and other comorbidities.   ? Expected Outcomes Short Term Goal: Understand basic principles of dietary content,  such as calories, fat, sodium, cholesterol and nutrients.;Short Term Goal: A plan has been developed with personal nutrition goals set during dietitian appointment.;Long Term Goal: Adherence to prescribed nutrition plan.   ? ?  ?  ? ?  ? ? ? ?Goals reviewed with patient; copy given to patient. ?

## 2021-04-09 NOTE — Progress Notes (Signed)
Daily Session Note ? ?Patient Details  ?Name: Jodee Wagenaar ?MRN: [id number] ?Date of Birth: 06/28/1970 ?Referring Provider:   ?Flowsheet Row Cardiac Rehab from 01/22/2021 in [facility] Cardiac and Pulmonary Rehab  ?Referring Provider Harrel Lemon MD  ? ?  ? ? ?Encounter Date: 04/09/2021 ? ?Check In: ? Session Check In - 04/09/21 1349   ? ?  ? Check-In  ? Supervising physician immediately available to respond to emergencies See telemetry face sheet for immediately available ER MD   ? Location [facility]-Cardiac & Pulmonary Rehab   ? Staff Present Renita Papa, RN BSN;Joseph [place], RCP,RRT,BSRT;Jessica [place], [place], [place], [place], CCET   ? Virtual Visit No   ? Medication changes reported     No   ? Fall or balance concerns reported    No   ? Warm-up and Cool-down Performed on first and last piece of equipment   ? Resistance Training Performed Yes   ? VAD Patient? No   ? PAD/SET Patient? No   ?  ? Pain Assessment  ? Currently in Pain? No/denies   ? ?  ?  ? ?  ? ? ? ? ? ?Social History  ? ?Tobacco Use  ?Smoking Status Former  ? Types: Cigarettes  ? Quit date: 12/09/2020  ? Years since quitting: 0.3  ?Smokeless Tobacco Never  ? ? ?Goals Met:  ?Independence with exercise equipment ?Exercise tolerated well ?No report of concerns or symptoms today ?Strength training completed today ? ?Goals Unmet:  ?Not Applicable ? ?Comments: Pt able to follow exercise prescription today without complaint.  Will continue to monitor for progression. ? ? 6 Minute Walk   ? ? [place] Name 01/22/21 1116 04/09/21 1420  ?  ?  ? 6 Minute Walk  ? Phase Initial Discharge   ? Distance 580 feet 880 feet   ? Distance % Change -- 51.7 %   ? Distance Feet Change -- 300 ft   ? Walk Time 6 minutes 6 minutes   ? # of Rest Breaks 0 0   ? MPH 1.09 1.67   ? METS 2.88 3.44   ? RPE 11 12   ? Perceived Dyspnea  0 --   ? VO2 Peak 10.11 12.05   ? Symptoms Yes (comment) No   ? Comments Left leg sore --   ? Resting HR 80 bpm 74 bpm   ? Resting BP 100/64 128/70   ? Resting Oxygen  Saturation  99 % --   ? Exercise Oxygen Saturation  during 6 min walk 96 % --   ? Max Ex. HR 88 bpm 84 bpm   ? Max Ex. BP 114/64 136/70   ? 2 Minute Post BP 96/62 --   ? ?  ?  ? ?  ? ? ? ?Dr. Emily Filbert is Medical Director for [place].  ?Dr. Ottie Glazier is Medical Director for [facility] Pulmonary Rehabilitation. ?

## 2021-04-13 ENCOUNTER — Other Ambulatory Visit: Payer: Self-pay

## 2021-04-13 DIAGNOSIS — I214 Non-ST elevation (NSTEMI) myocardial infarction: Secondary | ICD-10-CM | POA: Diagnosis not present

## 2021-04-13 DIAGNOSIS — Z955 Presence of coronary angioplasty implant and graft: Secondary | ICD-10-CM

## 2021-04-13 NOTE — Progress Notes (Signed)
Daily Session Note ? ?Patient Details  ?Name: Beth Carroll ?MRN: [id number] ?Date of Birth: [birth year] ?Referring Provider:   ?Flowsheet Row Cardiac Rehab from 01/22/2021 in [facility] Cardiac and Pulmonary Rehab  ?Referring Provider Harrel Lemon MD  ? ?  ? ? ?Encounter Date: 04/13/2021 ? ?Check In: ? Session Check In - 04/13/21 1409   ? ?  ? Check-In  ? Supervising physician immediately available to respond to emergencies See telemetry face sheet for immediately available ER MD   ? Location [facility]-Cardiac & Pulmonary Rehab   ? Staff Present Birdie Sons, MPA, Nino Glow, MS, ASCM CEP, Exercise Physiologist;Joseph [place], [place]   ? Virtual Visit No   ? Medication changes reported     No   ? Fall or balance concerns reported    No   ? Tobacco Cessation No Change   ? Warm-up and Cool-down Performed on first and last piece of equipment   ? Resistance Training Performed Yes   ? VAD Patient? No   ? PAD/SET Patient? No   ?  ? Pain Assessment  ? Currently in Pain? No/denies   ? ?  ?  ? ?  ? ? ? ? ? ?Social History  ? ?Tobacco Use  ?Smoking Status Former  ? Types: Cigarettes  ? Quit date: 12/09/2020  ? Years since quitting: 0.3  ?Smokeless Tobacco Never  ? ? ?Goals Met:  ?Independence with exercise equipment ?Exercise tolerated well ?No report of concerns or symptoms today ?Strength training completed today ? ?Goals Unmet:  ?Not Applicable ? ?Comments: Pt able to follow exercise prescription today without complaint.  Will continue to monitor for progression. ? ? ? ?Dr. Emily Filbert is Medical Director for [place].  ?Dr. Ottie Glazier is Medical Director for [facility] Pulmonary Rehabilitation. ?

## 2021-04-14 ENCOUNTER — Other Ambulatory Visit (INDEPENDENT_AMBULATORY_CARE_PROVIDER_SITE_OTHER): Payer: Self-pay | Admitting: Nurse Practitioner

## 2021-04-14 ENCOUNTER — Other Ambulatory Visit (INDEPENDENT_AMBULATORY_CARE_PROVIDER_SITE_OTHER): Payer: Self-pay

## 2021-04-14 DIAGNOSIS — I739 Peripheral vascular disease, unspecified: Secondary | ICD-10-CM

## 2021-04-15 ENCOUNTER — Ambulatory Visit (INDEPENDENT_AMBULATORY_CARE_PROVIDER_SITE_OTHER): Payer: No Typology Code available for payment source | Admitting: Nurse Practitioner

## 2021-04-15 ENCOUNTER — Encounter (INDEPENDENT_AMBULATORY_CARE_PROVIDER_SITE_OTHER): Payer: Self-pay | Admitting: Nurse Practitioner

## 2021-04-15 ENCOUNTER — Other Ambulatory Visit: Payer: Self-pay

## 2021-04-15 ENCOUNTER — Encounter: Payer: Self-pay | Admitting: *Deleted

## 2021-04-15 ENCOUNTER — Ambulatory Visit (INDEPENDENT_AMBULATORY_CARE_PROVIDER_SITE_OTHER): Payer: No Typology Code available for payment source

## 2021-04-15 ENCOUNTER — Encounter (INDEPENDENT_AMBULATORY_CARE_PROVIDER_SITE_OTHER): Payer: No Typology Code available for payment source

## 2021-04-15 VITALS — BP 130/83 | HR 70 | Resp 15 | Wt 113.2 lb

## 2021-04-15 DIAGNOSIS — I214 Non-ST elevation (NSTEMI) myocardial infarction: Secondary | ICD-10-CM

## 2021-04-15 DIAGNOSIS — I70221 Atherosclerosis of native arteries of extremities with rest pain, right leg: Secondary | ICD-10-CM | POA: Diagnosis not present

## 2021-04-15 DIAGNOSIS — I10 Essential (primary) hypertension: Secondary | ICD-10-CM

## 2021-04-15 DIAGNOSIS — E119 Type 2 diabetes mellitus without complications: Secondary | ICD-10-CM

## 2021-04-15 DIAGNOSIS — Z955 Presence of coronary angioplasty implant and graft: Secondary | ICD-10-CM

## 2021-04-15 DIAGNOSIS — I739 Peripheral vascular disease, unspecified: Secondary | ICD-10-CM

## 2021-04-15 DIAGNOSIS — Z9889 Other specified postprocedural states: Secondary | ICD-10-CM

## 2021-04-15 NOTE — Progress Notes (Signed)
Cardiac Individual Treatment Plan ? ?Patient Details  ?Name: Beth Carroll ?MRN: [id number] ?Date of Birth: 1970-12-09 ?Referring Provider:   ?Flowsheet Row Cardiac Rehab from 01/22/2021 in [facility] Cardiac and Pulmonary Rehab  ?Referring Provider Harrel Lemon MD  ? ?  ? ? ?Initial Encounter Date:  ?Flowsheet Row Cardiac Rehab from 01/22/2021 in [facility] Cardiac and Pulmonary Rehab  ?Date 01/22/21  ? ?  ? ? ?Visit Diagnosis: NSTEMI (non-ST elevated myocardial infarction) ([place]) ? ?Status post coronary artery stent placement ? ?Patient's Home Medications on Admission: ? ?Current Outpatient Medications:  ?  albuterol (VENTOLIN HFA) 108 (90 Base) MCG/ACT inhaler, Inhale 2 puffs into the lungs every 6 (six) hours as needed., Disp: , Rfl:  ?  aspirin EC 81 MG EC tablet, Take 1 tablet (81 mg total) by mouth daily. Swallow whole., Disp: 30 tablet, Rfl: 2 ?  atorvastatin (LIPITOR) 20 MG tablet, Take 1 tablet (20 mg total) by mouth daily., Disp: 30 tablet, Rfl: 0 ?  carvedilol (COREG) 3.125 MG tablet, Take 1 tablet (3.125 mg total) by mouth 2 (two) times daily with a meal., Disp: 60 tablet, Rfl: 0 ?  clopidogrel (PLAVIX) 75 MG tablet, Take 1 tablet (75 mg total) by mouth daily., Disp: 30 tablet, Rfl: 1 ?  dapagliflozin propanediol (FARXIGA) 10 MG TABS tablet, Take 1 tablet (10 mg total) by mouth daily before breakfast., Disp: 30 tablet, Rfl: 3 ?  furosemide (LASIX) 20 MG tablet, Take 20 mg by mouth daily., Disp: , Rfl:  ?  insulin aspart (NOVOLOG) 100 UNIT/ML FlexPen, Inject 5 Units into the skin 3 (three) times daily with meals., Disp: , Rfl:  ?  insulin glargine (LANTUS SOLOSTAR) 100 UNIT/ML Solostar Pen, Inject 25 Units into the skin daily., Disp: , Rfl:  ?  pantoprazole (PROTONIX) 40 MG tablet, Take 1 tablet (40 mg total) by mouth daily., Disp: 30 tablet, Rfl: 0 ?  Vitamin D, Ergocalciferol, (DRISDOL) 1.25 MG (50000 UNIT) CAPS capsule, Take 50,000 Units by mouth once a week., Disp: , Rfl:  ? ?Past Medical History: ?Past Medical  History:  ?Diagnosis Date  ? Coronary artery disease   ? Diabetes mellitus without complication ([place])   ? Diabetes mellitus, type 2 ([place])   ? HFrEF (heart failure with reduced ejection fraction) ([place])   ? Hyperlipemia   ? Hypertension   ? Ischemic cardiomyopathy   ? Orthostatic hypotension   ? PAD (peripheral artery disease) ([place])   ? Tobacco use   ? ? ?Tobacco Use: ?Social History  ? ?Tobacco Use  ?Smoking Status Former  ? Types: Cigarettes  ? Quit date: 12/09/2020  ? Years since quitting: 0.3  ?Smokeless Tobacco Never  ? ? ?Labs: ?Review Flowsheet   ? ?  ?  Latest Ref Rng & Units 12/10/2020 12/12/2020  ?Labs for [facility] Cardiac and Pulmonary Rehab  ?Cholestrol 0 - 200 mg/dL  120    ?LDL (calc) 0 - 99 mg/dL  63    ?HDL-C >40 mg/dL  45    ?Trlycerides <150 mg/dL  59    ?Hemoglobin A1c 4.8 - 5.6 % 13.7     ?  ? ? Multiple values from one day are sorted in reverse-chronological order  ?  ?  ? ? ? ?Exercise Target Goals: ?Exercise Program Goal: ?Individual exercise prescription set using results from initial 6 min walk test and THRR while considering  patient?s activity barriers and safety.  ? ?Exercise Prescription Goal: ?Initial exercise prescription builds to 30-45 minutes a day of aerobic activity,  2-3 days per week.  Home exercise guidelines will be given to patient during program as part of exercise prescription that the participant will acknowledge. ? ? ?Education: Aerobic Exercise: ?- Group verbal and visual presentation on the components of exercise prescription. Introduces F.I.T.T principle from ACSM for exercise prescriptions.  Reviews F.I.T.T. principles of aerobic exercise including progression. Written material given at graduation. ?Flowsheet Row Cardiac Rehab from 04/08/2021 in [facility] Cardiac and Pulmonary Rehab  ?Education need identified 01/22/21  ?Date 03/25/21  ?Educator AS  ?Instruction Review Code 1- Verbalizes Understanding  ? ?  ? ? ?Education: Resistance Exercise: ?- Group verbal and visual  presentation on the components of exercise prescription. Introduces F.I.T.T principle from ACSM for exercise prescriptions  Reviews F.I.T.T. principles of resistance exercise including progression. Written material given at graduation. ?Flowsheet Row Cardiac Rehab from 04/08/2021 in [facility] Cardiac and Pulmonary Rehab  ?Date 01/28/21  ?Educator AS  ?Instruction Review Code 1- Verbalizes Understanding  ? ?  ? ?  ?Education: Exercise & Equipment Safety: ?- Individual verbal instruction and demonstration of equipment use and safety with use of the equipment. ?Flowsheet Row Cardiac Rehab from 04/08/2021 in [facility] Cardiac and Pulmonary Rehab  ?Education need identified 01/22/21  ?Date 01/22/21  ?Educator KL  ?Instruction Review Code 1- Verbalizes Understanding  ? ?  ? ? ?Education: Exercise Physiology & General Exercise Guidelines: ?- Group verbal and written instruction with models to review the exercise physiology of the cardiovascular system and associated critical values. Provides general exercise guidelines with specific guidelines to those with heart or lung disease.  ?Flowsheet Row Cardiac Rehab from 04/08/2021 in [facility] Cardiac and Pulmonary Rehab  ?Date 03/18/21  ?Educator AS  ?Instruction Review Code 1- Verbalizes Understanding  ? ?  ? ? ?Education: Flexibility, Balance, Mind/Body Relaxation: ?- Group verbal and visual presentation with interactive activity on the components of exercise prescription. Introduces F.I.T.T principle from ACSM for exercise prescriptions. Reviews F.I.T.T. principles of flexibility and balance exercise training including progression. Also discusses the mind body connection.  Reviews various relaxation techniques to help reduce and manage stress (i.e. Deep breathing, progressive muscle relaxation, and visualization). Balance handout provided to take home. Written material given at graduation. ? ? ?Activity Barriers & Risk Stratification: ? Activity Barriers & Cardiac Risk Stratification -  01/22/21 1110   ? ?  ? Activity Barriers & Cardiac Risk Stratification  ? Activity Barriers Other (comment);Deconditioning;Muscular [employer] Device   ? Comments PAD, recent stents in legs (left has some swelling still)   ? Cardiac Risk Stratification High   ? ?  ?  ? ?  ? ? ?6 Minute Walk: ? 6 Minute Walk   ? ? [place] Name 01/22/21 1116 04/09/21 1420  ?  ?  ? 6 Minute Walk  ? Phase Initial Discharge   ? Distance 580 feet 880 feet   ? Distance % Change -- 51.7 %   ? Distance Feet Change -- 300 ft   ? Walk Time 6 minutes 6 minutes   ? # of Rest Breaks 0 0   ? MPH 1.09 1.67   ? METS 2.88 3.44   ? RPE 11 12   ? Perceived Dyspnea  0 --   ? VO2 Peak 10.11 12.05   ? Symptoms Yes (comment) No   ? Comments Left leg sore --   ? Resting HR 80 bpm 74 bpm   ? Resting BP 100/64 128/70   ? Resting Oxygen Saturation  99 % --   ? Exercise  Oxygen Saturation  during 6 min walk 96 % --   ? Max Ex. HR 88 bpm 84 bpm   ? Max Ex. BP 114/64 136/70   ? 2 Minute Post BP 96/62 --   ? ?  ?  ? ?  ? ? ?Oxygen Initial Assessment: ? ? ?Oxygen Re-Evaluation: ? ? ?Oxygen Discharge (Final Oxygen Re-Evaluation): ? ? ?Initial Exercise Prescription: ? Initial Exercise Prescription - 01/22/21 1100   ? ?  ? Date of Initial Exercise RX and Referring Provider  ? Date 01/22/21   ? Referring Provider Harrel Lemon MD   ?  ? Oxygen  ? Maintain Oxygen Saturation 88% or higher   ?  ? Recumbant Bike  ? Level 1   ? RPM 60   ? Watts 15   ? Minutes 15   ? METs 2.8   ?  ? NuStep  ? Level 1   ? SPM 80   ? Minutes 15   ? METs 2.8   ?  ? Biostep-RELP  ? Level 1   ? SPM 50   ? Minutes 15   ? METs 2.8   ?  ? Track  ? Laps 14   ? Minutes 15   ? METs 1.76   ?  ? Prescription Details  ? Frequency (times per week) 3   ? Duration Progress to 30 minutes of continuous aerobic without signs/symptoms of physical distress   ?  ? Intensity  ? THRR 40-80% of Max Heartrate 115-151   ? Ratings of Perceived Exertion 11-13   ? Perceived Dyspnea 0-4   ?  ? Progression  ?  Progression Continue to progress workloads to maintain intensity without signs/symptoms of physical distress.   ?  ? Resistance Training  ? Training Prescription Yes   ? Weight 2 lb   ? Reps 10-15   ? ?  ?  ? ?  ? ? ?Perform Cap

## 2021-04-15 NOTE — Progress Notes (Signed)
Daily Session Note ? ?Patient Details  ?Name: Beth Carroll ?MRN: [id number] ?Date of Birth: 1970-08-16 ?Referring Provider:   ?Flowsheet Row Cardiac Rehab from 01/22/2021 in [facility] Cardiac and Pulmonary Rehab  ?Referring Provider Harrel Lemon MD  ? ?  ? ? ?Encounter Date: 04/15/2021 ? ?Check In: ? Session Check In - 04/15/21 1405   ? ?  ? Check-In  ? Supervising physician immediately available to respond to emergencies See telemetry face sheet for immediately available ER MD   ? Location [facility]-Cardiac & Pulmonary Rehab   ? Staff Present Birdie Sons, MPA, RN;Joseph [place], RCP,RRT,BSRT;Jessica [place], MA, RCEP, CCRP, CCET   ? Virtual Visit No   ? Medication changes reported     No   ? Fall or balance concerns reported    No   ? Tobacco Cessation No Change   ? Warm-up and Cool-down Performed on first and last piece of equipment   ? Resistance Training Performed Yes   ? VAD Patient? No   ? PAD/SET Patient? No   ?  ? Pain Assessment  ? Currently in Pain? No/denies   ? ?  ?  ? ?  ? ? ? ? ? ?Social History  ? ?Tobacco Use  ?Smoking Status Former  ? Types: Cigarettes  ? Quit date: 12/09/2020  ? Years since quitting: 0.3  ?Smokeless Tobacco Never  ? ? ?Goals Met:  ?Independence with exercise equipment ?Exercise tolerated well ?No report of concerns or symptoms today ?Strength training completed today ? ?Goals Unmet:  ?Not Applicable ? ?Comments: Pt able to follow exercise prescription today without complaint.  Will continue to monitor for progression. ? ? ? ?Dr. Emily Filbert is Medical Director for [place].  ?Dr. Ottie Glazier is Medical Director for [facility] Pulmonary Rehabilitation. ?

## 2021-04-16 ENCOUNTER — Encounter: Payer: No Typology Code available for payment source | Admitting: *Deleted

## 2021-04-16 DIAGNOSIS — Z955 Presence of coronary angioplasty implant and graft: Secondary | ICD-10-CM

## 2021-04-16 DIAGNOSIS — I214 Non-ST elevation (NSTEMI) myocardial infarction: Secondary | ICD-10-CM | POA: Diagnosis not present

## 2021-04-16 NOTE — Progress Notes (Signed)
Daily Session Note ? ?Patient Details  ?Name: Beth Carroll ?MRN: [id number] ?Date of Birth: 05-10-1970 ?Referring Provider:   ?Flowsheet Row Cardiac Rehab from 01/22/2021 in [facility] Cardiac and Pulmonary Rehab  ?Referring Provider Harrel Lemon MD  ? ?  ? ? ?Encounter Date: 04/16/2021 ? ?Check In: ? Session Check In - 04/16/21 1415   ? ?  ? Check-In  ? Supervising physician immediately available to respond to emergencies See telemetry face sheet for immediately available ER MD   ? Location [facility]-Cardiac & Pulmonary Rehab   ? Staff Present Renita Papa, RN BSN;Joseph Skidmore, RCP,RRT,BSRT;Jessica [place], [place], [place], [place], CCET   ? Virtual Visit No   ? Medication changes reported     No   ? Fall or balance concerns reported    No   ? Tobacco Cessation No Change   ? Current number of cigarettes/nicotine per day     0   ? Warm-up and Cool-down Performed on first and last piece of equipment   ? Resistance Training Performed Yes   ? VAD Patient? No   ? PAD/SET Patient? No   ?  ? Pain Assessment  ? Currently in Pain? No/denies   ? ?  ?  ? ?  ? ? ? ? ? ?Social History  ? ?Tobacco Use  ?Smoking Status Former  ? Types: Cigarettes  ? Quit date: 12/09/2020  ? Years since quitting: 0.3  ?Smokeless Tobacco Never  ? ? ?Goals Met:  ?Independence with exercise equipment ?Exercise tolerated well ?No report of concerns or symptoms today ?Strength training completed today ? ?Goals Unmet:  ?Not Applicable ? ?Comments: Pt able to follow exercise prescription today without complaint.  Will continue to monitor for progression. ? ? ? ?Dr. Emily Filbert is Medical Director for [place].  ?Dr. Ottie Glazier is Medical Director for [facility] Pulmonary Rehabilitation. ?

## 2021-04-19 ENCOUNTER — Encounter (INDEPENDENT_AMBULATORY_CARE_PROVIDER_SITE_OTHER): Payer: Self-pay | Admitting: Nurse Practitioner

## 2021-04-19 NOTE — Progress Notes (Signed)
? ?Subjective:  ? ? Patient ID: Beth Carroll, female    DOB: 1970-05-28, 51 y.o.   MRN: [id number] ?Chief Complaint  ?Patient presents with  ? Follow-up  ?  Ultrasound follow up  ? ? ?Beth Carroll is a 51 year old female that returns to the office for followup and review of the noninvasive studies. There have been no interval changes in lower extremity symptoms. No interval shortening of the patient's claudication distance or development of rest pain symptoms. No new ulcers or wounds have occurred since the last visit. ? ?There have been no significant changes to the patient's overall health care. ? ?The patient denies amaurosis fugax or recent TIA symptoms. There are no recent neurological changes noted. ?The patient denies history of DVT, PE or superficial thrombophlebitis. ?The patient denies recent episodes of angina or shortness of breath.  ? ?ABI Rt= 0.97 and Lt= 1.05 (previous ABI's Rt= 1.03 and Lt= 1.05) ?Duplex ultrasound of the the right lower extremity has biphasic tibial artery waveforms with normal toe waveforms the left has biphasic/triphasic waveforms and normal toe waveforms. ? ? ?Review of Systems  ?Cardiovascular:  Negative for leg swelling.  ?All other systems reviewed and are negative. ? ?   ?Objective:  ? Physical Exam ?Vitals reviewed.  ?HENT:  ?   Head: Normocephalic.  ?Cardiovascular:  ?   Rate and Rhythm: Normal rate.  ?   Pulses:     ?     Dorsalis pedis pulses are 1+ on the right side and 1+ on the left side.  ?Pulmonary:  ?   Effort: Pulmonary effort is normal.  ?Skin: ?   General: Skin is warm and dry.  ?   Capillary Refill: Capillary refill takes less than 2 seconds.  ?Neurological:  ?   Mental Status: She is alert and oriented to person, place, and time.  ?Psychiatric:     ?   Mood and Affect: Mood normal.     ?   Behavior: Behavior normal.     ?   Thought Content: Thought content normal.     ?   Judgment: Judgment normal.  ? ? ?BP 130/83 (BP Location: Right Arm)   Pulse 70   Resp  15   Wt 113 lb 3.2 oz (51.3 kg)   BMI 19.28 kg/m?  ? ?Past Medical History:  ?Diagnosis Date  ? Coronary artery disease   ? Diabetes mellitus without complication ([place])   ? Diabetes mellitus, type 2 ([place])   ? HFrEF (heart failure with reduced ejection fraction) ([place])   ? Hyperlipemia   ? Hypertension   ? Ischemic cardiomyopathy   ? Orthostatic hypotension   ? PAD (peripheral artery disease) ([place])   ? Tobacco use   ? ? ?Social History  ? ?Socioeconomic History  ? Marital status: Single  ?  Spouse name: Not on file  ? Number of children: Not on file  ? Years of education: Not on file  ? Highest education level: Not on file  ?Occupational History  ? Not on file  ?Tobacco Use  ? Smoking status: Former  ?  Types: Cigarettes  ?  Quit date: 12/09/2020  ?  Years since quitting: 0.3  ? Smokeless tobacco: Never  ?Vaping Use  ? Vaping Use: Never used  ?Substance and Sexual Activity  ? Alcohol use: Never  ? Drug use: Not on file  ? Sexual activity: Not on file  ?Other Topics Concern  ? Not on file  ?Social History Narrative  ?  Not on file  ? ?Social Determinants of Health  ? ?Financial Resource Strain: Not on file  ?Food Insecurity: Not on file  ?Transportation Needs: Not on file  ?Physical Activity: Not on file  ?Stress: Not on file  ?Social Connections: Not on file  ?Intimate Partner Violence: Not on file  ? ? ?Past Surgical History:  ?Procedure Laterality Date  ? CORONARY STENT INTERVENTION N/A 12/15/2020  ? Procedure: CORONARY STENT INTERVENTION;  Surgeon: Wellington Hampshire, MD;  Location: [place] [facility];  Service: Cardiovascular;  Laterality: N/A;  ? LEFT HEART CATH AND CORONARY ANGIOGRAPHY N/A 12/15/2020  ? Procedure: LEFT HEART CATH AND CORONARY ANGIOGRAPHY;  Surgeon: Wellington Hampshire, MD;  Location: [place] [facility];  Service: Cardiovascular;  Laterality: N/A;  ? LOWER EXTREMITY ANGIOGRAPHY Left 12/10/2020  ? Procedure: Lower Extremity Angiography;  Surgeon: Algernon Huxley, MD;  Location: [place] [facility]  [facility];  Service: Cardiovascular;  Laterality: Left;  ? LOWER EXTREMITY ANGIOGRAPHY Right 12/25/2020  ? Procedure: LOWER EXTREMITY ANGIOGRAPHY;  Surgeon: Algernon Huxley, MD;  Location: [place] [facility];  Service: Cardiovascular;  Laterality: Right;  ? ? ?Family History  ?Problem Relation Age of Onset  ? Hypertension Father   ? ? ?No Known Allergies ? ? ?  Latest Ref Rng & Units 12/16/2020  ?  3:54 AM 12/15/2020  ?  5:41 AM 12/14/2020  ?  5:58 AM  ?CBC  ?WBC 4.0 - 10.5 K/uL 3.6   3.8   3.6    ?Hemoglobin 12.0 - 15.0 g/dL 10.0   9.9   10.1    ?Hematocrit 36.0 - 46.0 % 30.5   30.6   30.6    ?Platelets 150 - 400 K/uL 184   155   137    ? ? ? ? ?CMP  ?   ?Component Value Date/Time  ? NA 137 03/18/2021 1541  ? K 3.5 03/18/2021 1541  ? CL 95 (L) 03/18/2021 1541  ? CO2 24 03/18/2021 1541  ? GLUCOSE 235 (H) 03/18/2021 1541  ? GLUCOSE 290 (H) 12/16/2020 0354  ? BUN 15 03/18/2021 1541  ? CREATININE 0.63 03/18/2021 1541  ? CALCIUM 9.7 03/18/2021 1541  ? PROT 7.4 12/09/2020 1501  ? ALBUMIN 3.9 12/09/2020 1501  ? AST 15 12/09/2020 1501  ? ALT 12 12/09/2020 1501  ? ALKPHOS 59 12/09/2020 1501  ? BILITOT 2.2 (H) 12/09/2020 1501  ? GFRNONAA NOT CALCULATED 12/25/2020 1005  ? ? ? ?No results found. ? ?   ?Assessment & Plan:  ? ?1. 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 Recommend: ? ?The patient has evidence of atherosclerosis of the lower extremities with no claudication.  The patient does not voice lifestyle limiting changes at this point in time. ? ?Noninvasive studies do not suggest clinically significant change. ? ?No invasive studies, angiography or surgery at this time ?The patient should continue walking and begin a more formal exercise program.  ?The patient should continue antiplatelet therapy and aggressive treatment of the lipid abnormalities ? ?No changes in the patient's medications at this time ? ?The patient should continue wearing graduated compression socks 10-15 mmHg strength to control the  mild edema.   ? ?We will have patient return in 6 months with ABIs and lower extremity arterial duplexes ? ?2. Diabetes mellitus without complication ([place]) ?Continue pulmonary medications and aerosols as already ordered, these medications have been reviewed and there are no changes at this time. ?  ? ?3.  Primary hypertension ?Continue antihypertensive medications as already ordered, these medications have been reviewed and there are no changes at this time.  ? ? ?Current Outpatient Medications on File Prior to Visit  ?Medication Sig Dispense Refill  ? albuterol (VENTOLIN HFA) 108 (90 Base) MCG/ACT inhaler Inhale 2 puffs into the lungs every 6 (six) hours as needed.    ? aspirin EC 81 MG EC tablet Take 1 tablet (81 mg total) by mouth daily. Swallow whole. 30 tablet 2  ? atorvastatin (LIPITOR) 20 MG tablet Take 1 tablet (20 mg total) by mouth daily. 30 tablet 0  ? carvedilol (COREG) 3.125 MG tablet Take 1 tablet (3.125 mg total) by mouth 2 (two) times daily with a meal. 60 tablet 0  ? clopidogrel (PLAVIX) 75 MG tablet Take 1 tablet (75 mg total) by mouth daily. 30 tablet 1  ? dapagliflozin propanediol (FARXIGA) 10 MG TABS tablet Take 1 tablet (10 mg total) by mouth daily before breakfast. 30 tablet 3  ? furosemide (LASIX) 20 MG tablet Take 20 mg by mouth daily.    ? insulin aspart (NOVOLOG) 100 UNIT/ML FlexPen Inject 5 Units into the skin 3 (three) times daily with meals.    ? insulin glargine (LANTUS SOLOSTAR) 100 UNIT/ML Solostar Pen Inject 25 Units into the skin daily.    ? pantoprazole (PROTONIX) 40 MG tablet Take 1 tablet (40 mg total) by mouth daily. 30 tablet 0  ? Vitamin D, Ergocalciferol, (DRISDOL) 1.25 MG (50000 UNIT) CAPS capsule Take 50,000 Units by mouth once a week.    ? ?No current facility-administered medications on file prior to visit.  ? ? ?There are no Patient Instructions on file for this visit. ?No follow-ups on file. ? ? ?Kris Hartmann, NP ? ? ?

## 2021-04-20 ENCOUNTER — Other Ambulatory Visit: Payer: Self-pay

## 2021-04-20 DIAGNOSIS — I214 Non-ST elevation (NSTEMI) myocardial infarction: Secondary | ICD-10-CM

## 2021-04-20 DIAGNOSIS — Z955 Presence of coronary angioplasty implant and graft: Secondary | ICD-10-CM

## 2021-04-20 NOTE — Progress Notes (Signed)
Cardiac Individual Treatment Plan ? ?Patient Details  ?Name: Beth Carroll ?MRN: [id number] ?Date of Birth: 03-29-1970 ?Referring Provider:   ?Flowsheet Row Cardiac Rehab from 01/22/2021 in [facility] Cardiac and Pulmonary Rehab  ?Referring Provider Harrel Lemon MD  ? ?  ? ? ?Initial Encounter Date:  ?Flowsheet Row Cardiac Rehab from 01/22/2021 in [facility] Cardiac and Pulmonary Rehab  ?Date 01/22/21  ? ?  ? ? ?Visit Diagnosis: NSTEMI (non-ST elevated myocardial infarction) ([place]) ? ?Status post coronary artery stent placement ? ?Patient's Home Medications on Admission: ? ?Current Outpatient Medications:  ?  albuterol (VENTOLIN HFA) 108 (90 Base) MCG/ACT inhaler, Inhale 2 puffs into the lungs every 6 (six) hours as needed., Disp: , Rfl:  ?  aspirin EC 81 MG EC tablet, Take 1 tablet (81 mg total) by mouth daily. Swallow whole., Disp: 30 tablet, Rfl: 2 ?  atorvastatin (LIPITOR) 20 MG tablet, Take 1 tablet (20 mg total) by mouth daily., Disp: 30 tablet, Rfl: 0 ?  carvedilol (COREG) 3.125 MG tablet, Take 1 tablet (3.125 mg total) by mouth 2 (two) times daily with a meal., Disp: 60 tablet, Rfl: 0 ?  clopidogrel (PLAVIX) 75 MG tablet, Take 1 tablet (75 mg total) by mouth daily., Disp: 30 tablet, Rfl: 1 ?  dapagliflozin propanediol (FARXIGA) 10 MG TABS tablet, Take 1 tablet (10 mg total) by mouth daily before breakfast., Disp: 30 tablet, Rfl: 3 ?  furosemide (LASIX) 20 MG tablet, Take 20 mg by mouth daily., Disp: , Rfl:  ?  insulin aspart (NOVOLOG) 100 UNIT/ML FlexPen, Inject 5 Units into the skin 3 (three) times daily with meals., Disp: , Rfl:  ?  insulin glargine (LANTUS SOLOSTAR) 100 UNIT/ML Solostar Pen, Inject 25 Units into the skin daily., Disp: , Rfl:  ?  pantoprazole (PROTONIX) 40 MG tablet, Take 1 tablet (40 mg total) by mouth daily., Disp: 30 tablet, Rfl: 0 ?  Vitamin D, Ergocalciferol, (DRISDOL) 1.25 MG (50000 UNIT) CAPS capsule, Take 50,000 Units by mouth once a week., Disp: , Rfl:  ? ?Past Medical History: ?Past Medical  History:  ?Diagnosis Date  ? Coronary artery disease   ? Diabetes mellitus without complication ([place])   ? Diabetes mellitus, type 2 ([place])   ? HFrEF (heart failure with reduced ejection fraction) ([place])   ? Hyperlipemia   ? Hypertension   ? Ischemic cardiomyopathy   ? Orthostatic hypotension   ? PAD (peripheral artery disease) ([place])   ? Tobacco use   ? ? ?Tobacco Use: ?Social History  ? ?Tobacco Use  ?Smoking Status Former  ? Types: Cigarettes  ? Quit date: 12/09/2020  ? Years since quitting: 0.3  ?Smokeless Tobacco Never  ? ? ?Labs: ?Review Flowsheet   ? ?  ?  Latest Ref Rng & Units 12/10/2020 12/12/2020  ?Labs for ITP Cardiac and Pulmonary Rehab  ?Cholestrol 0 - 200 mg/dL  120    ?LDL (calc) 0 - 99 mg/dL  63    ?HDL-C >40 mg/dL  45    ?Trlycerides <150 mg/dL  59    ?Hemoglobin A1c 4.8 - 5.6 % 13.7     ?  ? ? Multiple values from one day are sorted in reverse-chronological order  ?  ?  ? ? ? ?Exercise Target Goals: ?Exercise Program Goal: ?Individual exercise prescription set using results from initial 6 min walk test and THRR while considering  patient?s activity barriers and safety.  ? ?Exercise Prescription Goal: ?Initial exercise prescription builds to 30-45 minutes a day of aerobic activity,  2-3 days per week.  Home exercise guidelines will be given to patient during program as part of exercise prescription that the participant will acknowledge. ? ? ?Education: Aerobic Exercise: ?- Group verbal and visual presentation on the components of exercise prescription. Introduces F.I.T.T principle from ACSM for exercise prescriptions.  Reviews F.I.T.T. principles of aerobic exercise including progression. Written material given at graduation. ?Flowsheet Row Cardiac Rehab from 04/15/2021 in [facility] Cardiac and Pulmonary Rehab  ?Education need identified 01/22/21  ?Date 03/25/21  ?Educator AS  ?Instruction Review Code 1- Verbalizes Understanding  ? ?  ? ? ?Education: Resistance Exercise: ?- Group verbal and visual  presentation on the components of exercise prescription. Introduces F.I.T.T principle from ACSM for exercise prescriptions  Reviews F.I.T.T. principles of resistance exercise including progression. Written material given at graduation. ?Flowsheet Row Cardiac Rehab from 04/15/2021 in [facility] Cardiac and Pulmonary Rehab  ?Date 01/28/21  ?Educator AS  ?Instruction Review Code 1- Verbalizes Understanding  ? ?  ? ?  ?Education: Exercise & Equipment Safety: ?- Individual verbal instruction and demonstration of equipment use and safety with use of the equipment. ?Flowsheet Row Cardiac Rehab from 04/15/2021 in [facility] Cardiac and Pulmonary Rehab  ?Education need identified 01/22/21  ?Date 01/22/21  ?Educator KL  ?Instruction Review Code 1- Verbalizes Understanding  ? ?  ? ? ?Education: Exercise Physiology & General Exercise Guidelines: ?- Group verbal and written instruction with models to review the exercise physiology of the cardiovascular system and associated critical values. Provides general exercise guidelines with specific guidelines to those with heart or lung disease.  ?Flowsheet Row Cardiac Rehab from 04/15/2021 in [facility] Cardiac and Pulmonary Rehab  ?Date 03/18/21  ?Educator AS  ?Instruction Review Code 1- Verbalizes Understanding  ? ?  ? ? ?Education: Flexibility, Balance, Mind/Body Relaxation: ?- Group verbal and visual presentation with interactive activity on the components of exercise prescription. Introduces F.I.T.T principle from ACSM for exercise prescriptions. Reviews F.I.T.T. principles of flexibility and balance exercise training including progression. Also discusses the mind body connection.  Reviews various relaxation techniques to help reduce and manage stress (i.e. Deep breathing, progressive muscle relaxation, and visualization). Balance handout provided to take home. Written material given at graduation. ?Flowsheet Row Cardiac Rehab from 04/15/2021 in [facility] Cardiac and Pulmonary Rehab  ?Date 04/15/21   ?Educator AS  ?Instruction Review Code 1- Verbalizes Understanding  ? ?  ? ? ?Activity Barriers & Risk Stratification: ? Activity Barriers & Cardiac Risk Stratification - 01/22/21 1110   ? ?  ? Activity Barriers & Cardiac Risk Stratification  ? Activity Barriers Other (comment);Deconditioning;Muscular [employer] Device   ? Comments PAD, recent stents in legs (left has some swelling still)   ? Cardiac Risk Stratification High   ? ?  ?  ? ?  ? ? ?6 Minute Walk: ? 6 Minute Walk   ? ? [place] Name 01/22/21 1116 04/09/21 1420  ?  ?  ? 6 Minute Walk  ? Phase Initial Discharge   ? Distance 580 feet 880 feet   ? Distance % Change -- 51.7 %   ? Distance Feet Change -- 300 ft   ? Walk Time 6 minutes 6 minutes   ? # of Rest Breaks 0 0   ? MPH 1.09 1.67   ? METS 2.88 3.44   ? RPE 11 12   ? Perceived Dyspnea  0 --   ? VO2 Peak 10.11 12.05   ? Symptoms Yes (comment) No   ? Comments Left leg sore --   ?  Resting HR 80 bpm 74 bpm   ? Resting BP 100/64 128/70   ? Resting Oxygen Saturation  99 % --   ? Exercise Oxygen Saturation  during 6 min walk 96 % --   ? Max Ex. HR 88 bpm 84 bpm   ? Max Ex. BP 114/64 136/70   ? 2 Minute Post BP 96/62 --   ? ?  ?  ? ?  ? ? ?Oxygen Initial Assessment: ? ? ?Oxygen Re-Evaluation: ? ? ?Oxygen Discharge (Final Oxygen Re-Evaluation): ? ? ?Initial Exercise Prescription: ? Initial Exercise Prescription - 01/22/21 1100   ? ?  ? Date of Initial Exercise RX and Referring Provider  ? Date 01/22/21   ? Referring Provider Harrel Lemon MD   ?  ? Oxygen  ? Maintain Oxygen Saturation 88% or higher   ?  ? Recumbant Bike  ? Level 1   ? RPM 60   ? Watts 15   ? Minutes 15   ? METs 2.8   ?  ? NuStep  ? Level 1   ? SPM 80   ? Minutes 15   ? METs 2.8   ?  ? Biostep-RELP  ? Level 1   ? SPM 50   ? Minutes 15   ? METs 2.8   ?  ? Track  ? Laps 14   ? Minutes 15   ? METs 1.76   ?  ? Prescription Details  ? Frequency (times per week) 3   ? Duration Progress to 30 minutes of continuous aerobic without signs/symptoms of  physical distress   ?  ? Intensity  ? THRR 40-80% of Max Heartrate 115-151   ? Ratings of Perceived Exertion 11-13   ? Perceived Dyspnea 0-4   ?  ? Progression  ? Progression Continue to progress workloads to maintain int

## 2021-04-20 NOTE — Progress Notes (Signed)
Daily Session Note ? ?Patient Details  ?Name: Beth Carroll ?MRN: [id number] ?Date of Birth: 11-20-1970 ?Referring Provider:   ?Flowsheet Row Cardiac Rehab from 01/22/2021 in [facility] Cardiac and Pulmonary Rehab  ?Referring Provider Harrel Lemon MD  ? ?  ? ? ?Encounter Date: 04/20/2021 ? ?Check In: ? Session Check In - 04/20/21 1357   ? ?  ? Check-In  ? Supervising physician immediately available to respond to emergencies See telemetry face sheet for immediately available ER MD   ? Location [facility]-Cardiac & Pulmonary Rehab   ? Staff Present Birdie Sons, MPA, Nino Glow, MS, ASCM CEP, Exercise Physiologist;Joseph [place], [place]   ? Virtual Visit No   ? Medication changes reported     No   ? Fall or balance concerns reported    No   ? Tobacco Cessation No Change   ? Warm-up and Cool-down Performed on first and last piece of equipment   ? Resistance Training Performed Yes   ? VAD Patient? No   ? PAD/SET Patient? No   ?  ? Pain Assessment  ? Currently in Pain? No/denies   ? ?  ?  ? ?  ? ? ? ? ? ?Social History  ? ?Tobacco Use  ?Smoking Status Former  ? Types: Cigarettes  ? Quit date: 12/09/2020  ? Years since quitting: 0.3  ?Smokeless Tobacco Never  ? ? ?Goals Met:  ?Independence with exercise equipment ?Exercise tolerated well ?No report of concerns or symptoms today ?Strength training completed today ? ?Goals Unmet:  ?Not Applicable ? ?Comments:  [name] graduated today from  rehab with 36 sessions completed.  Details of the patient's exercise prescription and what She needs to do in order to continue the prescription and progress were discussed with patient.  Patient was given a copy of prescription and goals.  Patient verbalized understanding.  [name] plans to continue to exercise by going to the gym. ? ? ? ?Dr. Emily Filbert is Medical Director for [place].  ?Dr. Ottie Glazier is Medical Director for [facility] Pulmonary Rehabilitation. ?

## 2021-04-20 NOTE — Progress Notes (Signed)
Discharge Progress Report ? ?Patient Details  ?Name: Beth Carroll ?MRN: [id number] ?Date of Birth: December 19, 1970 ?Referring Provider:   ?Flowsheet Row Cardiac Rehab from 01/22/2021 in [facility]  ?Referring Provider Harrel Lemon MD  ? ?  ? ? ? ?Number of Visits: 51 ? ?Reason for Discharge:  ?Patient reached a stable level of exercise. ?Patient independent in their exercise. ?Patient has met program and personal goals. ? ?Smoking History:  ?Social History  ? ?Tobacco Use  ?Smoking Status Former  ? Types: Cigarettes  ? Quit date: 12/09/2020  ? Years since quitting: 0.3  ?Smokeless Tobacco Never  ? ? ?Diagnosis:  ?NSTEMI (non-ST elevated myocardial infarction) ([place]) ? ?Status post coronary artery stent placement ? ?ADL UCSD: ? ? ?Initial Exercise Prescription: ? Initial Exercise Prescription - 01/22/21 1100   ? ?  ? Date of Initial Exercise RX and Referring Provider  ? Date 01/22/21   ? Referring Provider Harrel Lemon MD   ?  ? Oxygen  ? Maintain Oxygen Saturation 88% or higher   ?  ? Recumbant Bike  ? Level 1   ? RPM 60   ? Watts 15   ? Minutes 15   ? METs 2.8   ?  ? NuStep  ? Level 1   ? SPM 80   ? Minutes 15   ? METs 2.8   ?  ? Biostep-RELP  ? Level 1   ? SPM 50   ? Minutes 15   ? METs 2.8   ?  ? Track  ? Laps 14   ? Minutes 15   ? METs 1.76   ?  ? Prescription Details  ? Frequency (times per week) 3   ? Duration Progress to 30 minutes of continuous aerobic without signs/symptoms of physical distress   ?  ? Intensity  ? THRR 40-80% of Max Heartrate 115-151   ? Ratings of Perceived Exertion 11-13   ? Perceived Dyspnea 0-4   ?  ? Progression  ? Progression Continue to progress workloads to maintain intensity without signs/symptoms of physical distress.   ?  ? Resistance Training  ? Training Prescription Yes   ? Weight 2 lb   ? Reps 10-15   ? ?  ?  ? ?  ? ? ?Discharge Exercise Prescription (Final Exercise Prescription Changes): ? Exercise Prescription Changes - 04/06/21 0900   ? ?  ? Response to  Exercise  ? Blood Pressure (Admit) 132/70   ? Blood Pressure (Exit) 104/60   ? Heart Rate (Admit) 75 bpm   ? Heart Rate (Exercise) 90 bpm   ? Heart Rate (Exit) 73 bpm   ? Rating of Perceived Exertion (Exercise) 12   ? Symptoms none   ? Duration Continue with 30 min of aerobic exercise without signs/symptoms of physical distress.   ? Intensity THRR unchanged   ?  ? Progression  ? Progression Continue to progress workloads to maintain intensity without signs/symptoms of physical distress.   ? Average METs 1.74   ?  ? Resistance Training  ? Training Prescription Yes   ? Weight 3 lb   ? Reps 10-15   ?  ? Interval Training  ? Interval Training No   ?  ? Treadmill  ? MPH 1.1   ? Grade 0.5   ? Minutes 15   ? METs 1.9   ?  ? NuStep  ? Level 3   ? Minutes 15   ? METs 2   ?  ?  Biostep-RELP  ? Level 1   ? Minutes 15   ? METs 2   ?  ? Track  ? Laps 18   ? Minutes 15   ? METs 1.98   ?  ? Home Exercise Plan  ? Plans to continue exercise at [facility] (comment)   ? Frequency Add 2 additional days to program exercise sessions.   ? Initial Home Exercises Provided 03/02/21   ? ?  ?  ? ?  ? ? ?Functional Capacity: ? 6 Minute Walk   ? ? [place] Name 01/22/21 1116 04/09/21 1420  ?  ?  ? 6 Minute Walk  ? Phase Initial Discharge   ? Distance 580 feet 880 feet   ? Distance % Change -- 51.7 %   ? Distance Feet Change -- 300 ft   ? Walk Time 6 minutes 6 minutes   ? # of Rest Breaks 0 0   ? MPH 1.09 1.67   ? METS 2.88 3.44   ? RPE 11 12   ? Perceived Dyspnea  0 --   ? VO2 Peak 10.11 12.05   ? Symptoms Yes (comment) No   ? Comments Left leg sore --   ? Resting HR 80 bpm 74 bpm   ? Resting BP 100/64 128/70   ? Resting Oxygen Saturation  99 % --   ? Exercise Oxygen Saturation  during 6 min walk 96 % --   ? Max Ex. HR 88 bpm 84 bpm   ? Max Ex. BP 114/64 136/70   ? 2 Minute Post BP 96/62 --   ? ?  ?  ? ?  ? ? ?Psychological, QOL, Others - Outcomes: ?PHQ 2/9: ? ?  04/13/2021  ?  2:12 PM 01/22/2021  ? 11:18 AM  ?Depression screen PHQ 2/9   ?Decreased Interest 0 0  ?Down, Depressed, Hopeless 0 0  ?PHQ - 2 Score 0 0  ?Altered sleeping 1 1  ?Tired, decreased energy 1 1  ?Change in appetite 0 0  ?Feeling bad or failure about yourself  0 0  ?Trouble concentrating 0 0  ?Moving slowly or fidgety/restless 0 0  ?Suicidal thoughts 0 0  ?PHQ-9 Score 2 2  ?Difficult doing work/chores Not difficult at all Somewhat difficult  ? ? ?Quality of Life: ? Quality of Life - 04/13/21 1409   ? ?  ? Quality of Life Scores  ? Health/Function Pre 24 %   ? Health/Function Post 25.53 %   ? Health/Function % Change 6.38 %   ? Socioeconomic Pre 26.25 %   ? Socioeconomic Post 26.25 %   ? Socioeconomic % Change  0 %   ? Psych/Spiritual Pre 30 %   ? Psych/Spiritual Post 30 %   ? Psych/Spiritual % Change 0 %   ? Family Pre 28.8 %   ? Family Post 23 %   ? Family % Change -20.14 %   ? GLOBAL Pre 26.4 %   ? GLOBAL Post 26.23 %   ? GLOBAL % Change -0.64 %   ? ?  ?  ? ?  ? ? ?Nutrition & Weight - Outcomes: ? Pre Biometrics - 01/22/21 1109   ? ?  ? Pre Biometrics  ? Height 5' 4.25" (1.632 m)   ? Weight 107 lb 14.4 oz (48.9 kg)   ? BMI (Calculated) 18.38   ? ?  ?  ? ?  ? ? Post Biometrics - 04/09/21 1421   ? ?  ?  Post  Biometrics  ? Height 5' 4.25" (1.632 m)   ? Weight 115 lb 14.4 oz (52.6 kg)   ? BMI (Calculated) 19.74   ? Single Leg Stand 13.6 seconds   ? ?  ?  ? ?  ? ? ?Nutrition: ? Nutrition Therapy & Goals - 02/02/21 1324   ? ?  ? Nutrition Therapy  ? Diet Heart healthy, low Na, T2DM, high kcal/high protein   ? Protein (specify units) 65-80g   ? Fiber 30 grams   ? Whole Grain Foods 3 servings   ? Saturated Fats 16 max. grams   ? Fruits and Vegetables 8 servings/day   ? Sodium 2 grams   ?  ? Personal Nutrition Goals  ? Nutrition Goal ST: 2-4 CHO per meals, 1-2 CHO snacks. Pair meals and snacks with fiber, fat, and protein. Switch to low Na deli meat, baked chicken, tuna instead of high Na deli meat.  LT: limit Na <2g/day, meet kcal and protein needs, become independent with CHO  counting.   ? Comments 51 y.o. F admitted to rehab for NSTEMI. Presents with T2DM A1C 12.7, HTN. Relevant medications incluse lipitor, lasix, novolog, lantus, vit D. Shawnda has been trying to gain weight and is now up to 118.6lbs as of today (115lbs 12/22/20). PYP 78. Unable to get food recall other than shredded wheat or oatmeal with lactose free 1 or 2% milk and broccoli with corn. Sherma reports her appetite has improved since quitting smoking during hospitalization 12/09/20. Her BG has been high in 300s and could not exercise on her first day of rehab due to high BG - saw MD this past friday, now taking short acting insulin at meal times (she is unsure of the name). Discussed diabetes MNT as well as some heart healthy and high kcal/protein MNT. Will continue to follow-up and review education as needed.   ?  ? Intervention Plan  ? Intervention Prescribe, educate and counsel regarding individualized specific dietary modifications aiming towards targeted core components such as weight, hypertension, lipid management, diabetes, heart failure and other comorbidities.   ? Expected Outcomes Short Term Goal: Understand basic principles of dietary content, such as calories, fat, sodium, cholesterol and nutrients.;Short Term Goal: A plan has been developed with personal nutrition goals set during dietitian appointment.;Long Term Goal: Adherence to prescribed nutrition plan.   ? ?  ?  ? ?  ? ? ?Nutrition Discharge: ? ? ?Education Questionnaire Score: ? Knowledge Questionnaire Score - 04/13/21 1409   ? ?  ? Knowledge Questionnaire Score  ? Pre Score 21/26: HTN, Angina, Nutrition, Exercise, Smoking   ? Post Score 24/26   ? ?  ?  ? ?  ? ? ?Goals reviewed with patient; copy given to patient. ?

## 2021-05-01 ENCOUNTER — Ambulatory Visit: Payer: No Typology Code available for payment source | Admitting: Medical

## 2021-05-01 NOTE — Progress Notes (Deleted)
?Cardiology Office Note:   ? ?Date:  05/01/2021  ? ?ID:  Beth Carroll, DOB [birth year], MRN [id number] ? ?PCP:  Baxter Hire, MD  ?[facility] Cardiologist:  Ida Rogue, MD  ?[facility] Electrophysiologist:  None  ? ?Referring MD: Baxter Hire, MD  ? ?Chief Complaint: 1 month follow-up ? ?History of Present Illness:   ? ?Beth Carroll is a 51 y.o. female with a hx of  with a hx of DM2, HTN, and lower extremity swelling and ongoing tobacco use who is being seen today for hospital follow-up.  ?  ?Admitted 12/09/20 for dizziness, nausea and poor appetite. Upon arrival to the ED she was hypertensive with BP in the 737T systolic, hypokalemic with a potassium of 2.4, BNP 806, and high-sensitivity troponin 28 with a delta of 27.  EKG showed a sinus rhythm with possible prior anterior infarct.  Chest x-ray showed no acute findings.  ABIs performed in the ED were notable for 0.4 on the right and 0.3 on the left with very poor waveforms.  She was started on a heparin drip and vascular surgery was consulted.  On 11/16 and she underwent left lower extremity intervention as outlined in the operative note.  Plans are to intervene on the right lower extremity, with timing uncertain at this time.  Echo showed an EF of 40-45%, apical and periapical hypokinesis with wall motion best preserved along the basal/mid wall, G2DD, normal RV systolic function and ventricular cavity size, estimated right atrial pressure 15 mmHg, and no significant valvular abnormalities.  Due to abnormal echo, cardiology was consulted.  She underwent cardiac cath for CM that showed subttal occlusion of the mLAD which was treated with DES to proximal ramus and LAD. She was started on DAPT with ASA and Plavix.  ?  ?seen 02/02/21 and was overall doing well from a cardiac standpoint. She had orthostatic hypotension and lisinopril was stopped.  ? ?Last seen 03/06/21 and BP Was high. She had chest wall soreness, was doing cardiac rehab.HCTZ was stopped  and Wilder Glade was added.  ? ?Today,  ? ?Past Medical History:  ?Diagnosis Date  ? Coronary artery disease   ? Diabetes mellitus without complication ([place])   ? Diabetes mellitus, type 2 ([place])   ? HFrEF (heart failure with reduced ejection fraction) ([place])   ? Hyperlipemia   ? Hypertension   ? Ischemic cardiomyopathy   ? Orthostatic hypotension   ? PAD (peripheral artery disease) ([place])   ? Tobacco use   ? ? ?Past Surgical History:  ?Procedure Laterality Date  ? CORONARY STENT INTERVENTION N/A 12/15/2020  ? Procedure: CORONARY STENT INTERVENTION;  Surgeon: Wellington Hampshire, MD;  Location: [place] [facility];  Service: Cardiovascular;  Laterality: N/A;  ? LEFT HEART CATH AND CORONARY ANGIOGRAPHY N/A 12/15/2020  ? Procedure: LEFT HEART CATH AND CORONARY ANGIOGRAPHY;  Surgeon: Wellington Hampshire, MD;  Location: [place] [facility];  Service: Cardiovascular;  Laterality: N/A;  ? LOWER EXTREMITY ANGIOGRAPHY Left 12/10/2020  ? Procedure: Lower Extremity Angiography;  Surgeon: Algernon Huxley, MD;  Location: [place] [facility];  Service: Cardiovascular;  Laterality: Left;  ? LOWER EXTREMITY ANGIOGRAPHY Right 12/25/2020  ? Procedure: LOWER EXTREMITY ANGIOGRAPHY;  Surgeon: Algernon Huxley, MD;  Location: [place] [facility];  Service: Cardiovascular;  Laterality: Right;  ? ? ?Current Medications: ?No outpatient medications have been marked as taking for the 05/01/21 encounter (Appointment) with Kathlen Mody, Sekai Nayak H, PA-C.  ?  ? ?Allergies:   Patient has no  known allergies.  ? ?Social History  ? ?Socioeconomic History  ? Marital status: Single  ?  Spouse name: Not on file  ? Number of children: Not on file  ? Years of education: Not on file  ? Highest education level: Not on file  ?Occupational History  ? Not on file  ?Tobacco Use  ? Smoking status: Former  ?  Types: Cigarettes  ?  Quit date: 12/09/2020  ?  Years since quitting: 0.3  ? Smokeless tobacco: Never  ?Vaping Use  ? Vaping Use: Never used  ?Substance and Sexual Activity  ?  Alcohol use: Never  ? Drug use: Not on file  ? Sexual activity: Not on file  ?Other Topics Concern  ? Not on file  ?Social History Narrative  ? Not on file  ? ?Social Determinants of Health  ? ?Financial Resource Strain: Not on file  ?Food Insecurity: Not on file  ?Transportation Needs: Not on file  ?Physical Activity: Not on file  ?Stress: Not on file  ?Social Connections: Not on file  ?  ? ?Family History: ?The patient's family history includes Hypertension in her father. ? ?ROS:   ?Please see the history of present illness.    ? All other systems reviewed and are negative. ? ?EKGs/Labs/Other Studies Reviewed:   ? ?The following studies were reviewed today: ?Cardiac cath 12/15/20 ?  Mid LAD lesion is 99% stenosed. ?  Ramus lesion is 90% stenosed. ?  Mid LAD to Dist LAD lesion is 40% stenosed. ?  A drug-eluting stent was successfully placed using a STENT ONYX FRONTIER 2.25X18. ?  A drug-eluting stent was successfully placed using a [place] 2.75X22. ?  Post intervention, there is a 0% residual stenosis. ?  Post intervention, there is a 0% residual stenosis. ?  The left ventricular systolic function is normal. ?  LV end diastolic pressure is normal. ?  The left ventricular ejection fraction is 35-45% by visual estimate. ?  ?1.  Severe two-vessel coronary artery disease with subtotal occlusion of the mid LAD as well as significant stenosis in proximal ramus.  The LAD is diffusely diseased and small in size in the mid to distal segment. ?2.  Moderately reduced LV systolic function with akinesis of mid to distal anterior and apical myocardium.  Normal left ventricular end-diastolic pressure. ?3.  Successful angioplasty and drug-eluting stent placement to the mid LAD as well as proximal ramus. ?  ?Recommendations: ?Dual antiplatelet therapy for at least 6 months. ?Aggressive treatment of risk factors. ?  ?Coronary Diagrams ?  ?Diagnostic ?Dominance: Right ?Intervention ?  ?  ?Echo 12/10/20 ? 1. Left  ventricular ejection fraction, by estimation, is 40 to 45%. The  ?left ventricle has mildly decreased function. The left ventricle  ?demonstrates regional wall motion abnormalities (Apical and periapical  ?hypokinesis, basal and mid wall motion best  ?preserved, consistent with possible stress cardiomyopathy though unable to  ?exclude ishcemia). Left ventricular diastolic parameters are consistent  ?with Grade II diastolic dysfunction (pseudonormalization).  ? 2. Right ventricular systolic function is normal. The right ventricular  ?size is normal. Tricuspid regurgitation signal is inadequate for assessing  ?PA pressure.  ? 3. The mitral valve is normal in structure. No evidence of mitral valve  ?regurgitation. No evidence of mitral stenosis.  ? 4. The aortic valve is normal in structure. Aortic valve regurgitation is  ?not visualized. No aortic stenosis is present.  ? 5. The inferior vena cava is dilated in size with <50% respiratory  ?  variability, suggesting right atrial pressure of 15 mmHg.  ?  ? ? ?EKG:  EKG is *** ordered today.  The ekg ordered today demonstrates *** ? ?Recent Labs: ?12/09/2020: ALT 12; B Natriuretic Peptide 106.3 ?12/10/2020: Magnesium 2.0 ?12/16/2020: Hemoglobin 10.0; Platelets 184 ?03/18/2021: BUN 15; Creatinine, Ser 0.63; Potassium 3.5; Sodium 137  ?Recent Lipid Panel ?   ?Component Value Date/Time  ? CHOL 120 12/12/2020 0532  ? TRIG 59 12/12/2020 0532  ? HDL 45 12/12/2020 0532  ? CHOLHDL 2.7 12/12/2020 0532  ? VLDL 12 12/12/2020 0532  ? [place] 63 12/12/2020 0532  ? ? ? ?Risk Assessment/Calculations:   ?{Does this patient have ATRIAL FIBRILLATION?:[phone]} ? ? ?Physical Exam:   ? ?VS:  There were no vitals taken for this visit.   ? ?Wt Readings from Last 3 Encounters:  ?04/15/21 113 lb 3.2 oz (51.3 kg)  ?04/09/21 115 lb 14.4 oz (52.6 kg)  ?03/06/21 123 lb (55.8 kg)  ?  ? ?GEN: *** Well nourished, well developed in no acute distress ?HEENT: Normal ?NECK: No JVD; No carotid  bruits ?LYMPHATICS: No lymphadenopathy ?CARDIAC: ***RRR, no murmurs, rubs, gallops ?RESPIRATORY:  Clear to auscultation without rales, wheezing or rhonchi  ?ABDOMEN: Soft, non-tender, non-distended ?MUSCULOSKELETAL:  No edema;

## 2021-05-29 ENCOUNTER — Ambulatory Visit: Payer: No Typology Code available for payment source

## 2021-05-29 ENCOUNTER — Encounter: Payer: Self-pay | Admitting: Medical

## 2021-05-29 ENCOUNTER — Ambulatory Visit (INDEPENDENT_AMBULATORY_CARE_PROVIDER_SITE_OTHER): Payer: No Typology Code available for payment source | Admitting: Medical

## 2021-05-29 VITALS — BP 170/100 | HR 72 | Ht 66.0 in | Wt 114.0 lb

## 2021-05-29 DIAGNOSIS — I5021 Acute systolic (congestive) heart failure: Secondary | ICD-10-CM

## 2021-05-29 DIAGNOSIS — I2510 Atherosclerotic heart disease of native coronary artery without angina pectoris: Secondary | ICD-10-CM

## 2021-05-29 DIAGNOSIS — E782 Mixed hyperlipidemia: Secondary | ICD-10-CM

## 2021-05-29 DIAGNOSIS — M7989 Other specified soft tissue disorders: Secondary | ICD-10-CM

## 2021-05-29 DIAGNOSIS — I10 Essential (primary) hypertension: Secondary | ICD-10-CM | POA: Diagnosis not present

## 2021-05-29 DIAGNOSIS — I739 Peripheral vascular disease, unspecified: Secondary | ICD-10-CM

## 2021-05-29 DIAGNOSIS — I255 Ischemic cardiomyopathy: Secondary | ICD-10-CM | POA: Diagnosis not present

## 2021-05-29 MED ORDER — LOSARTAN POTASSIUM 25 MG PO TABS: 12.5000 mg | ORAL_TABLET | Freq: Every day | ORAL | 2 refills | Status: DC

## 2021-05-29 NOTE — Progress Notes (Signed)
?Cardiology Office Note:   ? ?Date:  05/29/2021  ? ?ID:  Beth Carroll, DOB 02/10/1970, MRN [id number] ? ?PCP:  Baxter Hire, MD  ?[facility] Cardiologist:  Ida Rogue, MD  ?[facility] Electrophysiologist:  None  ? ?Referring MD: Baxter Hire, MD  ? ?Chief Complaint: 3 month follow-up ? ?History of Present Illness:   ? ?Beth Carroll is a 51 y.o. female with a hx of DM2, HTN, and lower extremity swelling and ongoing tobacco use who is being seen today for hospital follow-up.  ?  ?Admitted 12/09/20 for dizziness, nausea and poor appetite. Upon arrival to the ED she was hypertensive with BP in the 330Q systolic, hypokalemic with a potassium of 2.4, BNP 806, and high-sensitivity troponin 28 with a delta of 27.  EKG showed a sinus rhythm with possible prior anterior infarct.  Chest x-ray showed no acute findings.  ABIs performed in the ED were notable for 0.4 on the right and 0.3 on the left with very poor waveforms.  She was started on a heparin drip and vascular surgery was consulted.  On 11/16 and she underwent left lower extremity intervention as outlined in the operative note.  Plans are to intervene on the right lower extremity, with timing uncertain at this time.  Echo showed an EF of 40-45%, apical and periapical hypokinesis with wall motion best preserved along the basal/mid wall, G2DD, normal RV systolic function and ventricular cavity size, estimated right atrial pressure 15 mmHg, and no significant valvular abnormalities.  Due to abnormal echo, cardiology was consulted.  She underwent cardiac cath for CM that showed subttal occlusion of the mLAD which was treated with DES to proximal ramus and LAD. She was started on DAPT with ASA and Plavix.  ?  ?Seen 02/02/21 and was overall doing well from a cardiac standpoint. She had orthostatic hypotension and lisinopril was stopped. ? ?Last seen 03/06/21 and was BP was high, which was unusual. HCTZ was stopped and Wilder Glade was added for HFrEF.  ? ?Today,   the patient has right foot redness and swelling. No pain in the foot. No numbness or tingling. She denies calf pain. She has little feeling in the feet from diabetes. Denies shortness of breath. It is more swollen than normal, although she did work an extra shift. Pulse is faint. BP is very high, but she hasn't had her medication today. She has occasional lightheadedness.  ? ?Past Medical History:  ?Diagnosis Date  ? Coronary artery disease   ? Diabetes mellitus without complication ([place])   ? Diabetes mellitus, type 2 ([place])   ? HFrEF (heart failure with reduced ejection fraction) ([place])   ? Hyperlipemia   ? Hypertension   ? Ischemic cardiomyopathy   ? Orthostatic hypotension   ? PAD (peripheral artery disease) ([place])   ? Tobacco use   ? ? ?Past Surgical History:  ?Procedure Laterality Date  ? CORONARY STENT INTERVENTION N/A 12/15/2020  ? Procedure: CORONARY STENT INTERVENTION;  Surgeon: Wellington Hampshire, MD;  Location: [place] [facility];  Service: Cardiovascular;  Laterality: N/A;  ? LEFT HEART CATH AND CORONARY ANGIOGRAPHY N/A 12/15/2020  ? Procedure: LEFT HEART CATH AND CORONARY ANGIOGRAPHY;  Surgeon: Wellington Hampshire, MD;  Location: [place] [facility];  Service: Cardiovascular;  Laterality: N/A;  ? LOWER EXTREMITY ANGIOGRAPHY Left 12/10/2020  ? Procedure: Lower Extremity Angiography;  Surgeon: Algernon Huxley, MD;  Location: [place] [facility];  Service: Cardiovascular;  Laterality: Left;  ? LOWER EXTREMITY ANGIOGRAPHY Right  12/25/2020  ? Procedure: LOWER EXTREMITY ANGIOGRAPHY;  Surgeon: Algernon Huxley, MD;  Location: [place] [facility];  Service: Cardiovascular;  Laterality: Right;  ? ? ?Current Medications: ?Current Meds  ?Medication Sig  ? albuterol (VENTOLIN HFA) 108 (90 Base) MCG/ACT inhaler Inhale 2 puffs into the lungs every 6 (six) hours as needed.  ? aspirin EC 81 MG EC tablet Take 1 tablet (81 mg total) by mouth daily. Swallow whole.  ? atorvastatin (LIPITOR) 20 MG tablet Take 1 tablet (20 mg total)  by mouth daily.  ? carvedilol (COREG) 3.125 MG tablet Take 1 tablet (3.125 mg total) by mouth 2 (two) times daily with a meal.  ? clopidogrel (PLAVIX) 75 MG tablet Take 1 tablet (75 mg total) by mouth daily.  ? dapagliflozin propanediol (FARXIGA) 10 MG TABS tablet Take 1 tablet (10 mg total) by mouth daily before breakfast.  ? furosemide (LASIX) 20 MG tablet Take 20 mg by mouth daily.  ? insulin aspart (NOVOLOG) 100 UNIT/ML FlexPen Inject 5 Units into the skin 3 (three) times daily with meals.  ? insulin glargine (LANTUS SOLOSTAR) 100 UNIT/ML Solostar Pen Inject 25 Units into the skin daily.  ? losartan (COZAAR) 25 MG tablet Take 0.5 tablets (12.5 mg total) by mouth daily.  ? pantoprazole (PROTONIX) 40 MG tablet Take 1 tablet (40 mg total) by mouth daily.  ? Vitamin D, Ergocalciferol, (DRISDOL) 1.25 MG (50000 UNIT) CAPS capsule Take 50,000 Units by mouth once a week.  ?  ? ?Allergies:   Patient has no known allergies.  ? ?Social History  ? ?Socioeconomic History  ? Marital status: Single  ?  Spouse name: Not on file  ? Number of children: Not on file  ? Years of education: Not on file  ? Highest education level: Not on file  ?Occupational History  ? Not on file  ?Tobacco Use  ? Smoking status: Former  ?  Types: Cigarettes  ?  Quit date: 12/09/2020  ?  Years since quitting: 0.4  ? Smokeless tobacco: Never  ?Vaping Use  ? Vaping Use: Never used  ?Substance and Sexual Activity  ? Alcohol use: Never  ? Drug use: Not on file  ? Sexual activity: Not on file  ?Other Topics Concern  ? Not on file  ?Social History Narrative  ? Not on file  ? ?Social Determinants of Health  ? ?Financial Resource Strain: Not on file  ?Food Insecurity: Not on file  ?Transportation Needs: Not on file  ?Physical Activity: Not on file  ?Stress: Not on file  ?Social Connections: Not on file  ?  ? ?Family History: ?The patient's family history includes Hypertension in her father. ? ?ROS:   ?Please see the history of present illness.    ? All other  systems reviewed and are negative. ? ?EKGs/Labs/Other Studies Reviewed:   ? ?The following studies were reviewed today: ?Cardiac cath 12/15/20 ?  Mid LAD lesion is 99% stenosed. ?  Ramus lesion is 90% stenosed. ?  Mid LAD to Dist LAD lesion is 40% stenosed. ?  A drug-eluting stent was successfully placed using a STENT ONYX FRONTIER 2.25X18. ?  A drug-eluting stent was successfully placed using a [place] 2.75X22. ?  Post intervention, there is a 0% residual stenosis. ?  Post intervention, there is a 0% residual stenosis. ?  The left ventricular systolic function is normal. ?  LV end diastolic pressure is normal. ?  The left ventricular ejection fraction is 35-45% by visual estimate. ?  ?  1.  Severe two-vessel coronary artery disease with subtotal occlusion of the mid LAD as well as significant stenosis in proximal ramus.  The LAD is diffusely diseased and small in size in the mid to distal segment. ?2.  Moderately reduced LV systolic function with akinesis of mid to distal anterior and apical myocardium.  Normal left ventricular end-diastolic pressure. ?3.  Successful angioplasty and drug-eluting stent placement to the mid LAD as well as proximal ramus. ?  ?Recommendations: ?Dual antiplatelet therapy for at least 6 months. ?Aggressive treatment of risk factors. ?  ?Coronary Diagrams ?  ?Diagnostic ?Dominance: Right ?Intervention ?  ?  ?Echo 12/10/20 ? 1. Left ventricular ejection fraction, by estimation, is 40 to 45%. The  ?left ventricle has mildly decreased function. The left ventricle  ?demonstrates regional wall motion abnormalities (Apical and periapical  ?hypokinesis, basal and mid wall motion best  ?preserved, consistent with possible stress cardiomyopathy though unable to  ?exclude ishcemia). Left ventricular diastolic parameters are consistent  ?with Grade II diastolic dysfunction (pseudonormalization).  ? 2. Right ventricular systolic function is normal. The right ventricular  ?size is normal.  Tricuspid regurgitation signal is inadequate for assessing  ?PA pressure.  ? 3. The mitral valve is normal in structure. No evidence of mitral valve  ?regurgitation. No evidence of mitral stenosis.  ? 4

## 2021-05-29 NOTE — Patient Instructions (Signed)
Medication Instructions:  ?Your physician has recommended you make the following change in your medication:  ? ?START Losartan 12.5 mg (1/2 tablet) daily. An Rx has been sent to your pharmacy. ? ?*If you need a refill on your cardiac medications before your next appointment, please call your pharmacy* ? ? ?Lab Work: ?None ordered ?If you have labs (blood work) drawn today and your tests are completely normal, you will receive your results only by: ?MyChart Message (if you have MyChart) OR ?A paper copy in the mail ?If you have any lab test that is abnormal or we need to change your treatment, we will call you to review the results. ? ? ?Testing/Procedures: ?Your physician has requested that you have an echocardiogram. Echocardiography is a painless test that uses sound waves to create images of your heart. It provides your doctor with information about the size and shape of your heart and how well your heart?s chambers and valves are working. This procedure takes approximately one hour. There are no restrictions for this procedure. ? ? ?Your physician has requested that you have a lower extremity venous duplex. ? ? ?Follow-Up: ?At [facility], you and your health needs are our priority.  As part of our continuing mission to provide you with exceptional heart care, we have created designated Provider Care Teams.  These Care Teams include your primary Cardiologist (physician) and Advanced Practice Providers (APPs -  Physician Assistants and Nurse Practitioners) who all work together to provide you with the care you need, when you need it. ? ?We recommend signing up for the patient portal called "MyChart".  Sign up information is provided on this After Visit Summary.  MyChart is used to connect with patients for Virtual Visits (Telemedicine).  Patients are able to view lab/test results, encounter notes, upcoming appointments, etc.  Non-urgent messages can be sent to your provider as well.   ?To learn more about what  you can do with MyChart, go to [email or web address].   ? ?Your next appointment:   ?3 month(s) ? ?The format for your next appointment:   ?In Person ? ?Provider:   ?You may see Ida Rogue, MD or one of the following Advanced Practice Providers on your designated Care Team:   ?Murray Hodgkins, NP ?Christell Faith, PA-C ?Cadence Kathlen Mody, PA-C ? ? ?Other Instructions ?N/A ? ?Important Information About Sugar ? ? ? ? ? ? ?

## 2021-06-26 ENCOUNTER — Ambulatory Visit (INDEPENDENT_AMBULATORY_CARE_PROVIDER_SITE_OTHER): Payer: No Typology Code available for payment source

## 2021-06-26 DIAGNOSIS — I255 Ischemic cardiomyopathy: Secondary | ICD-10-CM

## 2021-06-26 LAB — ECHOCARDIOGRAM COMPLETE
AR max vel: 3.38 cm2
AV Area VTI: 3.73 cm2
AV Area mean vel: 3.84 cm2
AV Mean grad: 2 mmHg
AV Peak grad: 3.8 mmHg
Ao pk vel: 0.97 m/s
Area-P 1/2: 4.99 cm2
Calc EF: 61.8 %
S' Lateral: 2.8 cm
Single Plane A2C EF: 66.6 %
Single Plane A4C EF: 54.7 %

## 2021-06-26 MED ORDER — PERFLUTREN LIPID MICROSPHERE
1.0000 mL | INTRAVENOUS | Status: AC | PRN
  Administered 2021-06-26: 2 mL via INTRAVENOUS

## 2021-06-30 ENCOUNTER — Telehealth: Payer: Self-pay

## 2021-06-30 NOTE — Telephone Encounter (Signed)
Unable to leave a VM due to VM being full. Will try again later.

## 2021-06-30 NOTE — Telephone Encounter (Signed)
-----   Message from [place], PA-C sent at 06/27/2021 11:26 AM EDT ----- Echo showed pump function 40-45%, which is unchanged from prior echo.

## 2021-07-08 NOTE — Telephone Encounter (Signed)
2nd attempt to reach the patient echo results. Unable to lmom. Pt voicemail is full

## 2021-07-11 ENCOUNTER — Other Ambulatory Visit: Payer: Self-pay | Admitting: Medical

## 2021-07-16 NOTE — Telephone Encounter (Signed)
3rd attempt to reach the patient echo results. Unable to lmom. Pt voicemail is full. Results reviewed by the patient in mychart.

## 2021-08-05 ENCOUNTER — Telehealth: Payer: Self-pay | Admitting: Cardiovascular Disease

## 2021-08-05 NOTE — Telephone Encounter (Signed)
   Pre-operative Risk Assessment    Patient Name: Beth Carroll  DOB: 17-Jul-1970 MRN: [id number]      Request for Surgical Clearance    Procedure:   Colonoscopy   Date of Surgery:  Clearance 10/28/21                                 Surgeon:  Dr Madolyn Frieze Surgeon's Group or Practice Name:  [facility]  Phone number:  [phone] Fax number:  [phone]   Type of Clearance Requested:   - Pharmacy:  Hold Clopidogrel (Plavix) instructions   Type of Anesthesia:  Not Indicated   Additional requests/questions:    [name]   08/05/2021, 4:03 PM

## 2021-08-07 NOTE — Telephone Encounter (Signed)
   Name:  Devina Bezold  DOB:  [birth year]  MRN:  [id number]   Primary Cardiologist: Ida Rogue, MD  Chart reviewed as part of pre-operative protocol coverage.   Per Dr. Rockey Situ: Would recommend colonoscopy be delayed if that is advisable.  If other circumstances and colonoscopy is required at this time she could hold Plavix x5 days.  Would restart Plavix following procedure per GI doc  -I will route these recommendations to GI practice through epic.  Elgie Collard, PA-C 08/07/2021, 1:55 PM

## 2021-08-14 ENCOUNTER — Other Ambulatory Visit (INDEPENDENT_AMBULATORY_CARE_PROVIDER_SITE_OTHER): Payer: Self-pay | Admitting: Nurse Practitioner

## 2021-08-14 ENCOUNTER — Other Ambulatory Visit (INDEPENDENT_AMBULATORY_CARE_PROVIDER_SITE_OTHER): Payer: Self-pay | Admitting: Urology

## 2021-08-14 DIAGNOSIS — I739 Peripheral vascular disease, unspecified: Secondary | ICD-10-CM

## 2021-08-17 ENCOUNTER — Ambulatory Visit (INDEPENDENT_AMBULATORY_CARE_PROVIDER_SITE_OTHER): Payer: No Typology Code available for payment source

## 2021-08-17 ENCOUNTER — Telehealth (INDEPENDENT_AMBULATORY_CARE_PROVIDER_SITE_OTHER): Payer: Self-pay

## 2021-08-17 ENCOUNTER — Ambulatory Visit (INDEPENDENT_AMBULATORY_CARE_PROVIDER_SITE_OTHER): Payer: No Typology Code available for payment source | Admitting: Nurse Practitioner

## 2021-08-17 ENCOUNTER — Encounter (INDEPENDENT_AMBULATORY_CARE_PROVIDER_SITE_OTHER): Payer: Self-pay | Admitting: Nurse Practitioner

## 2021-08-17 VITALS — BP 185/94 | HR 77 | Resp 16 | Wt 113.6 lb

## 2021-08-17 DIAGNOSIS — I70221 Atherosclerosis of native arteries of extremities with rest pain, right leg: Secondary | ICD-10-CM

## 2021-08-17 DIAGNOSIS — I10 Essential (primary) hypertension: Secondary | ICD-10-CM | POA: Diagnosis not present

## 2021-08-17 DIAGNOSIS — Z9889 Other specified postprocedural states: Secondary | ICD-10-CM | POA: Diagnosis not present

## 2021-08-17 DIAGNOSIS — I739 Peripheral vascular disease, unspecified: Secondary | ICD-10-CM

## 2021-08-17 DIAGNOSIS — E119 Type 2 diabetes mellitus without complications: Secondary | ICD-10-CM

## 2021-08-18 NOTE — Telephone Encounter (Signed)
Spoke with the patient and she was given the information regarding her RLE angio on 08/19/21 with a 6:45 am arrival time to [facility] with Dr. Lucky Cowboy.

## 2021-08-19 ENCOUNTER — Encounter (INDEPENDENT_AMBULATORY_CARE_PROVIDER_SITE_OTHER): Payer: Self-pay | Admitting: Nurse Practitioner

## 2021-08-19 ENCOUNTER — Other Ambulatory Visit: Payer: Self-pay

## 2021-08-19 ENCOUNTER — Ambulatory Visit
Admission: RE | Admit: 2021-08-19 | Payer: No Typology Code available for payment source | Source: Home / Self Care | Admitting: Vascular Surgery

## 2021-08-19 ENCOUNTER — Encounter: Payer: Self-pay | Admitting: Vascular Surgery

## 2021-08-19 ENCOUNTER — Encounter
Admission: RE | Disposition: A | Discharge status: Home Health | Payer: Self-pay | Source: Home / Self Care | Attending: Vascular Surgery

## 2021-08-19 ENCOUNTER — Inpatient Hospital Stay
Admission: RE | Admit: 2021-08-19 | Discharge: 2021-08-26 | DRG: 271 | Disposition: A | Discharge status: Home Health | Payer: No Typology Code available for payment source | Attending: Vascular Surgery | Admitting: Vascular Surgery

## 2021-08-19 DIAGNOSIS — Z794 Long term (current) use of insulin: Secondary | ICD-10-CM

## 2021-08-19 DIAGNOSIS — Z95828 Presence of other vascular implants and grafts: Secondary | ICD-10-CM

## 2021-08-19 DIAGNOSIS — Z7902 Long term (current) use of antithrombotics/antiplatelets: Secondary | ICD-10-CM

## 2021-08-19 DIAGNOSIS — E785 Hyperlipidemia, unspecified: Secondary | ICD-10-CM | POA: Diagnosis present

## 2021-08-19 DIAGNOSIS — Z8249 Family history of ischemic heart disease and other diseases of the circulatory system: Secondary | ICD-10-CM

## 2021-08-19 DIAGNOSIS — I5022 Chronic systolic (congestive) heart failure: Secondary | ICD-10-CM | POA: Diagnosis present

## 2021-08-19 DIAGNOSIS — Z7982 Long term (current) use of aspirin: Secondary | ICD-10-CM

## 2021-08-19 DIAGNOSIS — I70221 Atherosclerosis of native arteries of extremities with rest pain, right leg: Secondary | ICD-10-CM | POA: Diagnosis present

## 2021-08-19 DIAGNOSIS — D329 Benign neoplasm of meninges, unspecified: Secondary | ICD-10-CM | POA: Diagnosis present

## 2021-08-19 DIAGNOSIS — Z79899 Other long term (current) drug therapy: Secondary | ICD-10-CM

## 2021-08-19 DIAGNOSIS — I70229 Atherosclerosis of native arteries of extremities with rest pain, unspecified extremity: Secondary | ICD-10-CM

## 2021-08-19 DIAGNOSIS — Z87891 Personal history of nicotine dependence: Secondary | ICD-10-CM

## 2021-08-19 DIAGNOSIS — I2510 Atherosclerotic heart disease of native coronary artery without angina pectoris: Secondary | ICD-10-CM | POA: Diagnosis present

## 2021-08-19 DIAGNOSIS — I743 Embolism and thrombosis of arteries of the lower extremities: Secondary | ICD-10-CM

## 2021-08-19 DIAGNOSIS — E1151 Type 2 diabetes mellitus with diabetic peripheral angiopathy without gangrene: Principal | ICD-10-CM | POA: Diagnosis present

## 2021-08-19 DIAGNOSIS — D689 Coagulation defect, unspecified: Secondary | ICD-10-CM | POA: Diagnosis present

## 2021-08-19 DIAGNOSIS — E1165 Type 2 diabetes mellitus with hyperglycemia: Secondary | ICD-10-CM | POA: Diagnosis not present

## 2021-08-19 DIAGNOSIS — I998 Other disorder of circulatory system: Principal | ICD-10-CM | POA: Diagnosis present

## 2021-08-19 DIAGNOSIS — I255 Ischemic cardiomyopathy: Secondary | ICD-10-CM | POA: Diagnosis present

## 2021-08-19 DIAGNOSIS — I110 Hypertensive heart disease with heart failure: Secondary | ICD-10-CM | POA: Diagnosis present

## 2021-08-19 HISTORY — PX: LOWER EXTREMITY ANGIOGRAPHY: CATH118251

## 2021-08-19 LAB — GLUCOSE, CAPILLARY
Glucose-Capillary: 294 mg/dL — ABNORMAL HIGH (ref 70–99)
Glucose-Capillary: 300 mg/dL — ABNORMAL HIGH (ref 70–99)
Glucose-Capillary: 287 mg/dL — ABNORMAL HIGH (ref 70–99)
Glucose-Capillary: 308 mg/dL — ABNORMAL HIGH (ref 70–99)

## 2021-08-19 LAB — COMPREHENSIVE METABOLIC PANEL
ALT: 14 U/L (ref 0–44)
AST: 22 U/L (ref 15–41)
Albumin: 4 g/dL (ref 3.5–5.0)
Alkaline Phosphatase: 75 U/L (ref 38–126)
Anion gap: 23 — ABNORMAL HIGH (ref 5–15)
BUN: 21 mg/dL — ABNORMAL HIGH (ref 6–20)
CO2: 10 mmol/L — ABNORMAL LOW (ref 22–32)
Calcium: 8.9 mg/dL (ref 8.9–10.3)
Chloride: 111 mmol/L (ref 98–111)
Creatinine, Ser: 0.86 mg/dL (ref 0.44–1.00)
GFR, Estimated: >60 mL/min (ref >60)
Glucose, Bld: 342 mg/dL — ABNORMAL HIGH (ref 70–99)
Potassium: 4.2 mmol/L (ref 3.5–5.1)
Sodium: 144 mmol/L (ref 135–145)
Total Bilirubin: 1.7 mg/dL — ABNORMAL HIGH (ref 0.3–1.2)
Total Protein: 7.4 g/dL (ref 6.5–8.1)

## 2021-08-19 LAB — CBC
HCT: 41.3 % (ref 36.0–46.0)
Hemoglobin: 12.5 g/dL (ref 12.0–15.0)
MCH: 28.2 pg (ref 26.0–34.0)
MCHC: 30.3 g/dL (ref 30.0–36.0)
MCV: 93.2 fL (ref 80.0–100.0)
Platelets: 237 10*3/uL (ref 150–400)
RBC: 4.43 MIL/uL (ref 3.87–5.11)
RDW: 13.4 % (ref 11.5–15.5)
WBC: 15 10*3/uL — ABNORMAL HIGH (ref 4.0–10.5)
nRBC: 0 % (ref 0.0–0.2)

## 2021-08-19 LAB — CREATININE, SERUM
Creatinine, Ser: 0.55 mg/dL (ref 0.44–1.00)
GFR, Estimated: >60 mL/min (ref >60)

## 2021-08-19 LAB — BUN: BUN: 17 mg/dL (ref 6–20)

## 2021-08-19 LAB — PROTIME-INR
INR: 1.5 — ABNORMAL HIGH (ref 0.8–1.2)
Prothrombin Time: 17.9 seconds — ABNORMAL HIGH (ref 11.4–15.2)

## 2021-08-19 SURGERY — LOWER EXTREMITY ANGIOGRAPHY
Anesthesia: Moderate Sedation | Laterality: Right

## 2021-08-19 MED ORDER — HYDROMORPHONE HCL 1 MG/ML IJ SOLN: 0.5000 mg | INTRAMUSCULAR | Status: DC | PRN

## 2021-08-19 MED ORDER — HEPARIN (PORCINE) 25000 UT/250ML-% IV SOLN
INTRAVENOUS | Status: AC
  Administered 2021-08-19: 600 [IU]/h via INTRA_ARTERIAL
  Filled 2021-08-19: qty 250

## 2021-08-19 MED ORDER — METHYLPREDNISOLONE SODIUM SUCC 125 MG IJ SOLR: 125.0000 mg | Freq: Once | INTRAMUSCULAR | Status: DC | PRN

## 2021-08-19 MED ORDER — MIDAZOLAM HCL 5 MG/5ML IJ SOLN
INTRAMUSCULAR | Status: AC
  Filled 2021-08-19: qty 5

## 2021-08-19 MED ORDER — SODIUM CHLORIDE 0.9 % IV SOLN: INTRAVENOUS | Status: DC

## 2021-08-19 MED ORDER — INSULIN ASPART 100 UNIT/ML IJ SOLN
0.0000 [IU] | Freq: Three times a day (TID) | INTRAMUSCULAR | Status: DC
  Administered 2021-08-20 (×2): 9 [IU] via SUBCUTANEOUS
  Administered 2021-08-20: 7 [IU] via SUBCUTANEOUS
  Filled 2021-08-19 (×3): qty 1

## 2021-08-19 MED ORDER — ONDANSETRON HCL 4 MG/2ML IJ SOLN
4.0000 mg | Freq: Four times a day (QID) | INTRAMUSCULAR | Status: DC | PRN
  Administered 2021-08-19: 4 mg via INTRAVENOUS
  Filled 2021-08-19: qty 2

## 2021-08-19 MED ORDER — APIXABAN 5 MG PO TABS
5.0000 mg | ORAL_TABLET | Freq: Two times a day (BID) | ORAL | Status: DC
  Administered 2021-08-20 – 2021-08-21 (×3): 5 mg via ORAL
  Filled 2021-08-19 (×3): qty 1

## 2021-08-19 MED ORDER — HYDRALAZINE HCL 20 MG/ML IJ SOLN: 20.0000 mg | INTRAMUSCULAR | Status: AC

## 2021-08-19 MED ORDER — PANTOPRAZOLE SODIUM 40 MG PO TBEC
40.0000 mg | DELAYED_RELEASE_TABLET | Freq: Every day | ORAL | Status: DC
Start: 2021-08-19 — End: 2021-08-26
  Administered 2021-08-20 – 2021-08-26 (×7): 40 mg via ORAL
  Filled 2021-08-19 (×7): qty 1

## 2021-08-19 MED ORDER — HEPARIN SODIUM (PORCINE) 1000 UNIT/ML IJ SOLN
INTRAMUSCULAR | Status: DC | PRN
  Administered 2021-08-19: 3000 [IU] via INTRAVENOUS
  Administered 2021-08-19: 5000 [IU] via INTRAVENOUS

## 2021-08-19 MED ORDER — SENNOSIDES-DOCUSATE SODIUM 8.6-50 MG PO TABS
1.0000 | ORAL_TABLET | Freq: Every evening | ORAL | Status: DC | PRN
Start: 2021-08-19 — End: 2021-08-26
  Administered 2021-08-21: 1 via ORAL

## 2021-08-19 MED ORDER — VITAMIN D (ERGOCALCIFEROL) 1.25 MG (50000 UNIT) PO CAPS
50000.0000 [IU] | ORAL_CAPSULE | ORAL | Status: DC
  Administered 2021-08-24: 50000 [IU] via ORAL
  Filled 2021-08-19: qty 1

## 2021-08-19 MED ORDER — HYDRALAZINE HCL 20 MG/ML IJ SOLN
INTRAMUSCULAR | Status: AC
  Administered 2021-08-19: 20 mg via INTRAVENOUS
  Filled 2021-08-19: qty 1

## 2021-08-19 MED ORDER — FENTANYL CITRATE PF 50 MCG/ML IJ SOSY
PREFILLED_SYRINGE | INTRAMUSCULAR | Status: AC
  Filled 2021-08-19: qty 1

## 2021-08-19 MED ORDER — FENTANYL CITRATE (PF) 100 MCG/2ML IJ SOLN
INTRAMUSCULAR | Status: DC | PRN
  Administered 2021-08-19 (×2): 50 ug via INTRAVENOUS

## 2021-08-19 MED ORDER — TIROFIBAN (AGGRASTAT) BOLUS VIA INFUSION
25.0000 ug/kg | Freq: Once | INTRAVENOUS | Status: AC
Start: 2021-08-19 — End: 2021-08-19

## 2021-08-19 MED ORDER — HYDROMORPHONE HCL 1 MG/ML IJ SOLN
1.0000 mg | Freq: Once | INTRAMUSCULAR | Status: AC | PRN
  Administered 2021-08-19: 1 mg via INTRAVENOUS

## 2021-08-19 MED ORDER — HYDROMORPHONE HCL 1 MG/ML IJ SOLN
INTRAMUSCULAR | Status: AC
  Filled 2021-08-19: qty 1

## 2021-08-19 MED ORDER — LABETALOL HCL 5 MG/ML IV SOLN: 10.0000 mg | INTRAVENOUS | Status: DC | PRN

## 2021-08-19 MED ORDER — IODIXANOL 320 MG/ML IV SOLN
INTRAVENOUS | Status: DC | PRN
  Administered 2021-08-19: 40 mL

## 2021-08-19 MED ORDER — ALTEPLASE 1 MG/ML SYRINGE FOR VASCULAR PROCEDURE
INTRAMUSCULAR | Status: DC | PRN
  Administered 2021-08-19: 8 mg via INTRA_ARTERIAL
  Administered 2021-08-19: 4 mg via INTRA_ARTERIAL

## 2021-08-19 MED ORDER — ACETAMINOPHEN 325 MG PO TABS
325.0000 mg | ORAL_TABLET | ORAL | Status: DC | PRN
  Administered 2021-08-22 – 2021-08-26 (×14): 650 mg via ORAL
  Filled 2021-08-19 (×15): qty 2

## 2021-08-19 MED ORDER — ALBUTEROL SULFATE (2.5 MG/3ML) 0.083% IN NEBU
3.0000 mL | INHALATION_SOLUTION | Freq: Four times a day (QID) | RESPIRATORY_TRACT | Status: DC | PRN
Start: 2021-08-19 — End: 2021-08-26

## 2021-08-19 MED ORDER — LABETALOL HCL 5 MG/ML IV SOLN
20.0000 mg | Freq: Once | INTRAVENOUS | Status: AC
Start: 2021-08-19 — End: 2021-08-19

## 2021-08-19 MED ORDER — PANTOPRAZOLE SODIUM 40 MG PO TBEC: 40.0000 mg | DELAYED_RELEASE_TABLET | Freq: Every day | ORAL | Status: DC

## 2021-08-19 MED ORDER — HEPARIN (PORCINE) 25000 UT/250ML-% IV SOLN
600.0000 [IU]/h | INTRAVENOUS | Status: DC
Start: 2021-08-19 — End: 2021-08-19

## 2021-08-19 MED ORDER — ASPIRIN 81 MG PO TBEC
81.0000 mg | DELAYED_RELEASE_TABLET | Freq: Every day | ORAL | Status: DC
  Administered 2021-08-20 – 2021-08-26 (×7): 81 mg via ORAL
  Filled 2021-08-19 (×7): qty 1

## 2021-08-19 MED ORDER — POTASSIUM CHLORIDE CRYS ER 20 MEQ PO TBCR: 20.0000 meq | EXTENDED_RELEASE_TABLET | Freq: Once | ORAL | Status: DC

## 2021-08-19 MED ORDER — FENTANYL CITRATE (PF) 100 MCG/2ML IJ SOLN
INTRAMUSCULAR | Status: AC
  Filled 2021-08-19: qty 2

## 2021-08-19 MED ORDER — OXYCODONE-ACETAMINOPHEN 5-325 MG PO TABS
1.0000 | ORAL_TABLET | ORAL | Status: DC | PRN
  Administered 2021-08-20 – 2021-08-21 (×3): 2 via ORAL
  Administered 2021-08-21: 1 via ORAL
  Filled 2021-08-19 (×4): qty 2

## 2021-08-19 MED ORDER — SODIUM CHLORIDE 0.9 % IV SOLN
INTRAVENOUS | Status: DC | PRN
  Administered 2021-08-19: 250 mL/h via INTRAVENOUS

## 2021-08-19 MED ORDER — ALTEPLASE 2 MG IJ SOLR
INTRAMUSCULAR | Status: AC
  Filled 2021-08-19: qty 8

## 2021-08-19 MED ORDER — SODIUM CHLORIDE 0.9 % IV SOLN
0.5000 mg/h | INTRAVENOUS | Status: DC
  Administered 2021-08-19: 0.5 mg/h
  Filled 2021-08-19: qty 10

## 2021-08-19 MED ORDER — SODIUM CHLORIDE 0.9 % IV SOLN
1.0000 mg/h | INTRAVENOUS | Status: DC
  Administered 2021-08-19: 1 mg/h
  Filled 2021-08-19: qty 10

## 2021-08-19 MED ORDER — INSULIN ASPART 100 UNIT/ML FLEXPEN: 6.0000 [IU] | PEN_INJECTOR | Freq: Three times a day (TID) | SUBCUTANEOUS | Status: DC

## 2021-08-19 MED ORDER — ONDANSETRON HCL 4 MG/2ML IJ SOLN
4.0000 mg | Freq: Four times a day (QID) | INTRAMUSCULAR | Status: DC | PRN
  Administered 2021-08-20: 4 mg via INTRAVENOUS

## 2021-08-19 MED ORDER — PHENOL 1.4 % MT LIQD
1.0000 | OROMUCOSAL | Status: DC | PRN
Start: 2021-08-19 — End: 2021-08-26

## 2021-08-19 MED ORDER — HYDRALAZINE HCL 20 MG/ML IJ SOLN: 5.0000 mg | INTRAMUSCULAR | Status: DC | PRN

## 2021-08-19 MED ORDER — METOPROLOL TARTRATE 5 MG/5ML IV SOLN: 2.0000 mg | INTRAVENOUS | Status: DC | PRN

## 2021-08-19 MED ORDER — FAMOTIDINE 20 MG PO TABS: 40.0000 mg | ORAL_TABLET | Freq: Once | ORAL | Status: DC | PRN

## 2021-08-19 MED ORDER — CEFAZOLIN SODIUM-DEXTROSE 2-4 GM/100ML-% IV SOLN: 2.0000 g | INTRAVENOUS | Status: AC

## 2021-08-19 MED ORDER — HEPARIN SODIUM (PORCINE) 1000 UNIT/ML IJ SOLN
INTRAMUSCULAR | Status: AC
  Filled 2021-08-19: qty 10

## 2021-08-19 MED ORDER — GUAIFENESIN-DM 100-10 MG/5ML PO SYRP: 15.0000 mL | ORAL_SOLUTION | ORAL | Status: DC | PRN

## 2021-08-19 MED ORDER — ALTEPLASE 2 MG IJ SOLR
INTRAMUSCULAR | Status: AC
  Filled 2021-08-19: qty 4

## 2021-08-19 MED ORDER — ATORVASTATIN CALCIUM 20 MG PO TABS
20.0000 mg | ORAL_TABLET | Freq: Every day | ORAL | Status: DC
  Administered 2021-08-20 – 2021-08-26 (×7): 20 mg via ORAL
  Filled 2021-08-19 (×7): qty 1

## 2021-08-19 MED ORDER — ALUM & MAG HYDROXIDE-SIMETH 200-200-20 MG/5ML PO SUSP: 15.0000 mL | ORAL | Status: DC | PRN

## 2021-08-19 MED ORDER — FENTANYL 2500MCG IN NS 250ML (10MCG/ML) PREMIX INFUSION
20.0000 ug/h | INTRAVENOUS | Status: DC
  Administered 2021-08-19: 20 ug/h via INTRAVENOUS
  Filled 2021-08-19 (×2): qty 250

## 2021-08-19 MED ORDER — MIDAZOLAM HCL 2 MG/2ML IJ SOLN
INTRAMUSCULAR | Status: DC | PRN
  Administered 2021-08-19: 1 mg via INTRAVENOUS
  Administered 2021-08-19 (×2): 1 mg
  Administered 2021-08-19 (×2): 1 mg via INTRAVENOUS
  Administered 2021-08-19: 1 mg

## 2021-08-19 MED ORDER — TIROFIBAN HCL IN NACL 5-0.9 MG/100ML-% IV SOLN
0.1500 ug/kg/min | INTRAVENOUS | Status: AC
  Administered 2021-08-19: 0.15 ug/kg/min via INTRAVENOUS
  Filled 2021-08-19 (×2): qty 100

## 2021-08-19 MED ORDER — CARVEDILOL 3.125 MG PO TABS
3.1250 mg | ORAL_TABLET | Freq: Two times a day (BID) | ORAL | Status: DC
  Administered 2021-08-21 – 2021-08-26 (×10): 3.125 mg via ORAL
  Filled 2021-08-19 (×12): qty 1

## 2021-08-19 MED ORDER — MIDAZOLAM HCL 2 MG/ML PO SYRP: 8.0000 mg | ORAL_SOLUTION | Freq: Once | ORAL | Status: DC | PRN

## 2021-08-19 MED ORDER — TIROFIBAN HCL IV 12.5 MG/250 ML
INTRAVENOUS | Status: AC
  Administered 2021-08-19: 1305 ug via INTRAVENOUS
  Filled 2021-08-19: qty 250

## 2021-08-19 MED ORDER — ONDANSETRON HCL 4 MG/2ML IJ SOLN
INTRAMUSCULAR | Status: AC
  Filled 2021-08-19: qty 2

## 2021-08-19 MED ORDER — ONDANSETRON HCL 4 MG/2ML IJ SOLN
INTRAMUSCULAR | Status: AC
  Administered 2021-08-19: 4 mg via INTRAVENOUS
  Filled 2021-08-19: qty 2

## 2021-08-19 MED ORDER — DIPHENHYDRAMINE HCL 50 MG/ML IJ SOLN: 50.0000 mg | Freq: Once | INTRAMUSCULAR | Status: DC | PRN

## 2021-08-19 MED ORDER — FUROSEMIDE 20 MG PO TABS
20.0000 mg | ORAL_TABLET | Freq: Every day | ORAL | Status: DC
  Administered 2021-08-20 – 2021-08-26 (×7): 20 mg via ORAL
  Filled 2021-08-19 (×7): qty 1

## 2021-08-19 MED ORDER — ACETAMINOPHEN 325 MG RE SUPP: 325.0000 mg | RECTAL | Status: DC | PRN

## 2021-08-19 MED ORDER — SORBITOL 70 % SOLN
30.0000 mL | Freq: Every day | Status: DC | PRN
Start: 2021-08-19 — End: 2021-08-26

## 2021-08-19 MED ORDER — CEFAZOLIN SODIUM-DEXTROSE 2-4 GM/100ML-% IV SOLN
INTRAVENOUS | Status: AC
  Administered 2021-08-19: 2 g via INTRAVENOUS
  Filled 2021-08-19: qty 100

## 2021-08-19 MED ORDER — LABETALOL HCL 5 MG/ML IV SOLN
INTRAVENOUS | Status: AC
  Administered 2021-08-19: 20 mg via INTRAVENOUS
  Filled 2021-08-19: qty 4

## 2021-08-19 MED ORDER — LOSARTAN POTASSIUM 25 MG PO TABS
12.5000 mg | ORAL_TABLET | Freq: Every day | ORAL | Status: DC
  Administered 2021-08-21 – 2021-08-26 (×6): 12.5 mg via ORAL
  Filled 2021-08-19 (×7): qty 1

## 2021-08-19 SURGICAL SUPPLY — 20 items
BALLN LUTONIX 018 5X300X130 (BALLOONS) ×2
BALLOON LUTONIX 018 5X300X130 (BALLOONS) IMPLANT
BIOPATCH RED 1 DISK 7.0 (GAUZE/BANDAGES/DRESSINGS) ×1 IMPLANT
CATH ANGIO 5F PIGTAIL 65CM (CATHETERS) ×1 IMPLANT
CATH BEACON 5 .038 100 VERT TP (CATHETERS) ×1 IMPLANT
CATH INFUS 135CMX50CM (CATHETERS) ×1 IMPLANT
CATH ROTAREX 135 6FR (CATHETERS) ×1 IMPLANT
COVER PROBE U/S 5X48 (MISCELLANEOUS) ×2 IMPLANT
GLIDEWIRE ADV .035X260CM (WIRE) ×1 IMPLANT
KIT CV MULTILUMEN 7FR 20 (SET/KITS/TRAYS/PACK) ×2
KIT CV MULTILUMEN 7FR 20 SUB (SET/KITS/TRAYS/PACK) IMPLANT
KIT ENCORE 26 ADVANTAGE (KITS) ×1 IMPLANT
PACK ANGIOGRAPHY (CUSTOM PROCEDURE TRAY) ×2 IMPLANT
SHEATH BRITE TIP 5FRX11 (SHEATH) ×1 IMPLANT
SHEATH PINNACLE ST 6F 45CM (SHEATH) ×1 IMPLANT
SUT PROLENE 0 CT 1 30 (SUTURE) ×1 IMPLANT
SYR MEDRAD MARK 7 150ML (SYRINGE) ×1 IMPLANT
TUBING CONTRAST HIGH PRESS 72 (TUBING) ×1 IMPLANT
WIRE G V18X300CM (WIRE) ×1 IMPLANT
WIRE GUIDERIGHT .035X150 (WIRE) ×1 IMPLANT

## 2021-08-19 SURGICAL SUPPLY — 19 items
BALLN LUTONIX  018 4X60X130 (BALLOONS) ×1
BALLN LUTONIX 018 4X60X130 (BALLOONS) ×1
BALLN LUTONIX 018 5X40X130 (BALLOONS) ×2
BALLN LUTONIX 018 5X60X130 (BALLOONS) ×2
BALLN ULTRVRSE 2.5X220X150 (BALLOONS) ×2
BALLOON LUTONIX 018 4X60X130 (BALLOONS) IMPLANT
BALLOON LUTONIX 018 5X40X130 (BALLOONS) IMPLANT
BALLOON LUTONIX 018 5X60X130 (BALLOONS) IMPLANT
BALLOON ULTRVRSE 2.5X220X150 (BALLOONS) IMPLANT
CANISTER PENUMBRA ENGINE (MISCELLANEOUS) ×1 IMPLANT
CATH INDIGO CAT6 KIT (CATHETERS) ×1 IMPLANT
DEVICE SAFEGUARD 24CM (GAUZE/BANDAGES/DRESSINGS) ×1 IMPLANT
DEVICE STARCLOSE SE CLOSURE (Vascular Products) ×1 IMPLANT
KIT ENCORE 26 ADVANTAGE (KITS) ×1 IMPLANT
PACK ANGIOGRAPHY (CUSTOM PROCEDURE TRAY) ×2 IMPLANT
STENT VIABAHN 5X7.5X120 (Permanent Stent) ×1 IMPLANT
STENT VIABAHN 6X25X120 (Permanent Stent) ×1 IMPLANT
WIRE G V18X300CM (WIRE) ×1 IMPLANT
WIRE GUIDERIGHT .035X150 (WIRE) ×1 IMPLANT

## 2021-08-19 NOTE — Progress Notes (Signed)
Subjective:    Patient ID: Beth Carroll, female    DOB: Oct 18, 1970, 51 y.o.   MRN: [id number] Chief Complaint  Patient presents with   Follow-up    Ultrasound follow up    The patient returns to the office for followup and review of the noninvasive studies.   The patient notes that there has been a significant deterioration in the lower extremity symptoms.  The patient notes interval shortening of their claudication distance and development of mild rest pain symptoms. No new ulcers or wounds have occurred since the last visit.  There have been no significant changes to the patient's overall health care.  The patient denies amaurosis fugax or recent TIA symptoms. There are no recent neurological changes noted. There is no history of DVT, PE or superficial thrombophlebitis. The patient denies recent episodes of angina or shortness of breath.   ABI's Rt=0.26 and Lt=0.95 (previous ABI's Rt=0.97 and Lt=1.05) Duplex US of the lower extremity arterial system shows dampened monophasic tibial artery waveforms in the right with monophasic/biphasic in the left.  There are no toe waveforms present in the right great toe.    Review of Systems  Skin:  Negative for wound.  All other systems reviewed and are negative.      Objective:   Physical Exam Vitals reviewed.  HENT:     Head: Normocephalic.  Cardiovascular:     Rate and Rhythm: Normal rate.     Pulses:          Dorsalis pedis pulses are 0 on the right side.       Posterior tibial pulses are 0 on the right side.  Pulmonary:     Effort: Pulmonary effort is normal.  Skin:    General: Skin is dry.     Capillary Refill: Capillary refill takes more than 3 seconds.  Neurological:     Mental Status: She is alert and oriented to person, place, and time.  Psychiatric:        Mood and Affect: Mood normal.        Behavior: Behavior normal.        Thought Content: Thought content normal.        Judgment: Judgment normal.     BP  (!) 185/94 (BP Location: Left Arm)   Pulse 77   Resp 16   Wt 113 lb 9.6 oz (51.5 kg)   BMI 18.34 kg/m   Past Medical History:  Diagnosis Date   Coronary artery disease    Diabetes mellitus without complication (HCC)    Diabetes mellitus, type 2 (HCC)    HFrEF (heart failure with reduced ejection fraction) (HCC)    Hyperlipemia    Hypertension    Ischemic cardiomyopathy    Orthostatic hypotension    PAD (peripheral artery disease) (HCC)    Tobacco use     Social History   Socioeconomic History   Marital status: Single    Spouse name: Not on file   Number of children: Not on file   Years of education: Not on file   Highest education level: Not on file  Occupational History   Not on file  Tobacco Use   Smoking status: Former    Types: Cigarettes    Quit date: 12/09/2020    Years since quitting: 0.6   Smokeless tobacco: Never  Vaping Use   Vaping Use: Never used  Substance and Sexual Activity   Alcohol use: Never   Drug use: Not on file   Sexual  activity: Not on file  Other Topics Concern   Not on file  Social History Narrative   Not on file   Social Determinants of Health   Financial Resource Strain: Not on file  Food Insecurity: Not on file  Transportation Needs: Not on file  Physical Activity: Not on file  Stress: Not on file  Social Connections: Not on file  Intimate Partner Violence: Not on file    Past Surgical History:  Procedure Laterality Date   CORONARY STENT INTERVENTION N/A 12/15/2020   Procedure: CORONARY STENT INTERVENTION;  Surgeon: Wellington Hampshire, MD;  Location: [place] [facility];  Service: Cardiovascular;  Laterality: N/A;   LEFT HEART CATH AND CORONARY ANGIOGRAPHY N/A 12/15/2020   Procedure: LEFT HEART CATH AND CORONARY ANGIOGRAPHY;  Surgeon: Wellington Hampshire, MD;  Location: [place] [facility];  Service: Cardiovascular;  Laterality: N/A;   LOWER EXTREMITY ANGIOGRAPHY Left 12/10/2020   Procedure: Lower Extremity Angiography;   Surgeon: Algernon Huxley, MD;  Location: [place] [facility];  Service: Cardiovascular;  Laterality: Left;   LOWER EXTREMITY ANGIOGRAPHY Right 12/25/2020   Procedure: LOWER EXTREMITY ANGIOGRAPHY;  Surgeon: Algernon Huxley, MD;  Location: [place] [facility];  Service: Cardiovascular;  Laterality: Right;    Family History  Problem Relation Age of Onset   Hypertension Father     No Known Allergies     Latest Ref Rng & Units 12/16/2020    3:54 AM 12/15/2020    5:41 AM 12/14/2020    5:58 AM  CBC  WBC 4.0 - 10.5 K/uL 3.6  3.8  3.6   Hemoglobin 12.0 - 15.0 g/dL 10.0  9.9  10.1   Hematocrit 36.0 - 46.0 % 30.5  30.6  30.6   Platelets 150 - 400 K/uL 184  155  137       CMP     Component Value Date/Time   NA 137 03/18/2021 1541   K 3.5 03/18/2021 1541   CL 95 (L) 03/18/2021 1541   CO2 24 03/18/2021 1541   GLUCOSE 235 (H) 03/18/2021 1541   GLUCOSE 290 (H) 12/16/2020 0354   BUN 15 03/18/2021 1541   CREATININE 0.63 03/18/2021 1541   CALCIUM 9.7 03/18/2021 1541   PROT 7.4 12/09/2020 1501   ALBUMIN 3.9 12/09/2020 1501   AST 15 12/09/2020 1501   ALT 12 12/09/2020 1501   ALKPHOS 59 12/09/2020 1501   BILITOT 2.2 (H) 12/09/2020 1501   GFRNONAA NOT CALCULATED 12/25/2020 1005     No results found.     Assessment & Plan:   1. 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  This represents a limb threatening ischemia and places the patient at the risk for right limb loss.  Patient should undergo angiography of the right lower extremity with the hope for intervention for limb salvage.  The risks and benefits as well as the alternative therapies was discussed in detail with the patient.  All questions were answered.  Patient agrees to proceed with right lower extremity  angiography.  The patient will follow up with me in the office after the procedure.       2. Diabetes mellitus without complication ([place]) Continue hypoglycemic medications as already ordered, these medications have been reviewed and there are no changes at this time.  Hgb A1C to be monitored as already arranged by primary service   3. Primary hypertension Continue antihypertensive medications as already ordered, these medications have been reviewed and there are no changes at this time.    Current Outpatient Medications on File Prior to Visit  Medication Sig Dispense Refill   albuterol (VENTOLIN HFA) 108 (90 Base) MCG/ACT inhaler Inhale 2 puffs into the lungs every 6 (six) hours as needed.     aspirin EC 81 MG EC tablet Take 1 tablet (81 mg total) by mouth daily. Swallow whole. 30 tablet 2   atorvastatin (LIPITOR) 20 MG tablet Take 1 tablet (20 mg total) by mouth daily. 30 tablet 0   carvedilol (COREG) 3.125 MG tablet Take 1 tablet (3.125 mg total) by mouth 2 (two) times daily with a meal. 60 tablet 0   clopidogrel (PLAVIX) 75 MG tablet Take 1 tablet (75 mg total) by mouth daily. 30 tablet 1   FARXIGA 10 MG TABS tablet TAKE 1 TABLET BY MOUTH DAILY BEFORE BREAKFAST. 30 tablet 3   furosemide (LASIX) 20 MG tablet Take 20 mg by mouth daily.     insulin aspart (NOVOLOG) 100 UNIT/ML FlexPen Inject 6-7 Units into the skin 3 (three) times daily with meals.     insulin glargine (LANTUS SOLOSTAR) 100 UNIT/ML Solostar Pen Inject 25 Units into the skin daily.     losartan (COZAAR) 25 MG tablet Take 0.5 tablets (12.5 mg total) by mouth daily. 15 tablet 2   pantoprazole (PROTONIX) 40 MG tablet Take 1 tablet (40 mg total) by mouth daily. 30 tablet 0   Vitamin D, Ergocalciferol, (DRISDOL) 1.25 MG (50000 UNIT) CAPS capsule Take 50,000 Units by mouth once a week.     No current facility-administered medications on file prior to visit.    There are no Patient Instructions on file for this visit. No  follow-ups on file.   Kris Hartmann, NP

## 2021-08-19 NOTE — H&P (View-Only) (Signed)
Subjective:    Patient ID: Beth Carroll, female    DOB: 1970/11/08, 51 y.o.   MRN: [id number] Chief Complaint  Patient presents with   Follow-up    Ultrasound follow up    The patient returns to the office for followup and review of the noninvasive studies.   The patient notes that there has been a significant deterioration in the lower extremity symptoms.  The patient notes interval shortening of their claudication distance and development of mild rest pain symptoms. No new ulcers or wounds have occurred since the last visit.  There have been no significant changes to the patient's overall health care.  The patient denies amaurosis fugax or recent TIA symptoms. There are no recent neurological changes noted. There is no history of DVT, PE or superficial thrombophlebitis. The patient denies recent episodes of angina or shortness of breath.   ABI's Rt=0.26 and Lt=0.95 (previous ABI's Rt=0.97 and Lt=1.05) Duplex US of the lower extremity arterial system shows dampened monophasic tibial artery waveforms in the right with monophasic/biphasic in the left.  There are no toe waveforms present in the right great toe.    Review of Systems  Skin:  Negative for wound.  All other systems reviewed and are negative.      Objective:   Physical Exam Vitals reviewed.  HENT:     Head: Normocephalic.  Cardiovascular:     Rate and Rhythm: Normal rate.     Pulses:          Dorsalis pedis pulses are 0 on the right side.       Posterior tibial pulses are 0 on the right side.  Pulmonary:     Effort: Pulmonary effort is normal.  Skin:    General: Skin is dry.     Capillary Refill: Capillary refill takes more than 3 seconds.  Neurological:     Mental Status: She is alert and oriented to person, place, and time.  Psychiatric:        Mood and Affect: Mood normal.        Behavior: Behavior normal.        Thought Content: Thought content normal.        Judgment: Judgment normal.     BP  (!) 185/94 (BP Location: Left Arm)   Pulse 77   Resp 16   Wt 113 lb 9.6 oz (51.5 kg)   BMI 18.34 kg/m   Past Medical History:  Diagnosis Date   Coronary artery disease    Diabetes mellitus without complication (HCC)    Diabetes mellitus, type 2 (HCC)    HFrEF (heart failure with reduced ejection fraction) (HCC)    Hyperlipemia    Hypertension    Ischemic cardiomyopathy    Orthostatic hypotension    PAD (peripheral artery disease) (HCC)    Tobacco use     Social History   Socioeconomic History   Marital status: Single    Spouse name: Not on file   Number of children: Not on file   Years of education: Not on file   Highest education level: Not on file  Occupational History   Not on file  Tobacco Use   Smoking status: Former    Types: Cigarettes    Quit date: 12/09/2020    Years since quitting: 0.6   Smokeless tobacco: Never  Vaping Use   Vaping Use: Never used  Substance and Sexual Activity   Alcohol use: Never   Drug use: Not on file   Sexual  activity: Not on file  Other Topics Concern   Not on file  Social History Narrative   Not on file   Social Determinants of Health   Financial Resource Strain: Not on file  Food Insecurity: Not on file  Transportation Needs: Not on file  Physical Activity: Not on file  Stress: Not on file  Social Connections: Not on file  Intimate Partner Violence: Not on file    Past Surgical History:  Procedure Laterality Date   CORONARY STENT INTERVENTION N/A 12/15/2020   Procedure: CORONARY STENT INTERVENTION;  Surgeon: Wellington Hampshire, MD;  Location: [place] [facility];  Service: Cardiovascular;  Laterality: N/A;   LEFT HEART CATH AND CORONARY ANGIOGRAPHY N/A 12/15/2020   Procedure: LEFT HEART CATH AND CORONARY ANGIOGRAPHY;  Surgeon: Wellington Hampshire, MD;  Location: [place] [facility];  Service: Cardiovascular;  Laterality: N/A;   LOWER EXTREMITY ANGIOGRAPHY Left 12/10/2020   Procedure: Lower Extremity Angiography;   Surgeon: Algernon Huxley, MD;  Location: [place] [facility];  Service: Cardiovascular;  Laterality: Left;   LOWER EXTREMITY ANGIOGRAPHY Right 12/25/2020   Procedure: LOWER EXTREMITY ANGIOGRAPHY;  Surgeon: Algernon Huxley, MD;  Location: [place] [facility];  Service: Cardiovascular;  Laterality: Right;    Family History  Problem Relation Age of Onset   Hypertension Father     No Known Allergies     Latest Ref Rng & Units 12/16/2020    3:54 AM 12/15/2020    5:41 AM 12/14/2020    5:58 AM  CBC  WBC 4.0 - 10.5 K/uL 3.6  3.8  3.6   Hemoglobin 12.0 - 15.0 g/dL 10.0  9.9  10.1   Hematocrit 36.0 - 46.0 % 30.5  30.6  30.6   Platelets 150 - 400 K/uL 184  155  137       CMP     Component Value Date/Time   NA 137 03/18/2021 1541   K 3.5 03/18/2021 1541   CL 95 (L) 03/18/2021 1541   CO2 24 03/18/2021 1541   GLUCOSE 235 (H) 03/18/2021 1541   GLUCOSE 290 (H) 12/16/2020 0354   BUN 15 03/18/2021 1541   CREATININE 0.63 03/18/2021 1541   CALCIUM 9.7 03/18/2021 1541   PROT 7.4 12/09/2020 1501   ALBUMIN 3.9 12/09/2020 1501   AST 15 12/09/2020 1501   ALT 12 12/09/2020 1501   ALKPHOS 59 12/09/2020 1501   BILITOT 2.2 (H) 12/09/2020 1501   GFRNONAA NOT CALCULATED 12/25/2020 1005     No results found.     Assessment & Plan:   1. 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  This represents a limb threatening ischemia and places the patient at the risk for right limb loss.  Patient should undergo angiography of the right lower extremity with the hope for intervention for limb salvage.  The risks and benefits as well as the alternative therapies was discussed in detail with the patient.  All questions were answered.  Patient agrees to proceed with right lower extremity  angiography.  The patient will follow up with me in the office after the procedure.       2. Diabetes mellitus without complication ([place]) Continue hypoglycemic medications as already ordered, these medications have been reviewed and there are no changes at this time.  Hgb A1C to be monitored as already arranged by primary service   3. Primary hypertension Continue antihypertensive medications as already ordered, these medications have been reviewed and there are no changes at this time.    Current Outpatient Medications on File Prior to Visit  Medication Sig Dispense Refill   albuterol (VENTOLIN HFA) 108 (90 Base) MCG/ACT inhaler Inhale 2 puffs into the lungs every 6 (six) hours as needed.     aspirin EC 81 MG EC tablet Take 1 tablet (81 mg total) by mouth daily. Swallow whole. 30 tablet 2   atorvastatin (LIPITOR) 20 MG tablet Take 1 tablet (20 mg total) by mouth daily. 30 tablet 0   carvedilol (COREG) 3.125 MG tablet Take 1 tablet (3.125 mg total) by mouth 2 (two) times daily with a meal. 60 tablet 0   clopidogrel (PLAVIX) 75 MG tablet Take 1 tablet (75 mg total) by mouth daily. 30 tablet 1   FARXIGA 10 MG TABS tablet TAKE 1 TABLET BY MOUTH DAILY BEFORE BREAKFAST. 30 tablet 3   furosemide (LASIX) 20 MG tablet Take 20 mg by mouth daily.     insulin aspart (NOVOLOG) 100 UNIT/ML FlexPen Inject 6-7 Units into the skin 3 (three) times daily with meals.     insulin glargine (LANTUS SOLOSTAR) 100 UNIT/ML Solostar Pen Inject 25 Units into the skin daily.     losartan (COZAAR) 25 MG tablet Take 0.5 tablets (12.5 mg total) by mouth daily. 15 tablet 2   pantoprazole (PROTONIX) 40 MG tablet Take 1 tablet (40 mg total) by mouth daily. 30 tablet 0   Vitamin D, Ergocalciferol, (DRISDOL) 1.25 MG (50000 UNIT) CAPS capsule Take 50,000 Units by mouth once a week.     No current facility-administered medications on file prior to visit.    There are no Patient Instructions on file for this visit. No  follow-ups on file.   Kris Hartmann, NP

## 2021-08-19 NOTE — Op Note (Signed)
[facility]  Percutaneous Study/Intervention Procedural Note   Date of Surgery: 08/19/2021  Surgeon(s):Camila Maita    Assistants:none  Pre-operative Diagnosis: PAD with rest Carroll right lower extremity  Post-operative diagnosis:  Same  Procedure(s) Performed:             1.  Right lower extremity angiogram             2.  Mechanical thrombectomy of the right SFA, popliteal artery, tibioperoneal trunk, proximal posterior tibial artery using the penumbra CAT 6 device             3.  Stent placement to the right popliteal artery with 5 mm diameter by 7.5 cm length Viabahn stent             4.  Percutaneous transluminal angioplasty of right posterior tibial artery with 2.5 mm diameter by 22 cm length angioplasty balloon             5.  Stent placement to the right SFA proximally with a 6 mm diameter by 2.5 cm length Viabahn stent  6.  StarClose closure device left femoral artery  EBL: 200 cc  Contrast: 40 cc  Fluoro Time: 5.1 minutes  Moderate Conscious Sedation Time: approximately 44 minutes using 3 mg of Versed               Indications:  Patient is a 51 y.o.female with rest Carroll of the right lower extremity who has been getting continuous thrombolytic therapy throughout the day and is being brought back for second look angiography. The patient is brought in for angiography for further evaluation and potential treatment.  Due to the limb threatening nature of the situation, angiogram was performed for attempted limb salvage. The patient is aware that if the procedure fails, amputation would be expected.  The patient also understands that even with successful revascularization, amputation may still be required due to the severity of the situation.  Risks and benefits are discussed and informed consent is obtained.   Procedure:  The patient was identified and appropriate procedural time out was performed.  The patient was then placed supine on the table and prepped  and draped in the usual sterile fashion. Moderate conscious sedation was administered during a face to face encounter with the patient throughout the procedure with my supervision of the RN administering medicines and monitoring the patient's vital signs, pulse oximetry, telemetry and mental status throughout from the start of the procedure until the patient was taken to the recovery room.  The existing lysis catheter was removed and the V18 wire was parked in the tibials.  Selective right lower extremity angiogram was then performed. This demonstrated continued thrombosis of the right SFA and popliteal stents with sluggish flow distally.. It was felt that it was in the patient's best interest to proceed with intervention after these images to avoid a second procedure and a larger amount of contrast and fluoroscopy based off of the findings from the initial angiogram. The patient was systemically heparinized.  I then brought the penumbra CAT 6 device on the field and perform mechanical thrombectomy with 3 passes through the right SFA, popliteal artery, tibioperoneal trunk, and proximal posterior tibial artery.  Several chronic appearing chunks of thrombus were seen with most of this coming from the popliteal artery below the previously placed stent for the most proximal SFA just above the previously placed stent.  There remained thrombosis with stenosis in the popliteal artery just below the previously placed  stent and I extended a 5 mm diameter by 7.5 cm length Viabahn stent down there and postdilated this with 4 and 5 mm balloons with excellent angiographic completion result and less than 10% residual stenosis.  The posterior tibial artery was occluded from thrombus and I performed angioplasty of the majority of the posterior tibial artery with a 2.5 mm diameter by 22 cm length angioplasty balloon inflated to 12 atm for 1 minute.  This resulted in restoration of patency of the right posterior tibial artery with  flow distally and no areas of greater than 30% stenosis.  The anterior tibial artery now had flow distally as well and there was two-vessel runoff.  There remained thrombus and stenosis at the proximal edge of the previously placed stent in the proximal right SFA.  I used a 6 mm diameter by 2.5 cm length Viabahn stent and postdilated this with a 5 mm balloon from the origin of the SFA bridging down to the proximal SFA stent.  Completion imaging showed no significant residual stenosis in the SFA after stent placement I elected to terminate the procedure. The sheath was removed and StarClose closure device was deployed in the left femoral artery with excellent hemostatic result. The patient was taken to the recovery room in stable condition having tolerated the procedure well.  Findings:                          Right Lower Extremity: This demonstrated continued thrombosis of the right SFA and popliteal stents with sluggish flow distally.   Disposition: Patient was taken to the recovery room in stable condition having tolerated the procedure well.  Complications: None  Beth Carroll 08/19/2021 5:47 PM   This note was created with Dragon Medical transcription system. Any errors in dictation are purely unintentional.

## 2021-08-19 NOTE — Op Note (Signed)
[facility]  Percutaneous Study/Intervention Procedural Note   Date of Surgery: 08/19/2021  Surgeon(s):Jarrell Armond    Assistants:none  Pre-operative Diagnosis: PAD with rest Carroll RLE  Post-operative diagnosis:  Same  Procedure(s) Performed:             1.  Ultrasound guidance for vascular access left femoral artery             2.  Catheter placement into right popliteal artery from left femoral approach             3.  Aortogram and selective right lower extremity angiogram             4.  Catheter directed thrombolytic therapy with a total of 12 mg of tPA to the right SFA, popliteal artery, tibioperoneal trunk, and proximal posterior tibial artery and placement of a 135 cm total length 50 cm working length catheter for continuous thrombolytic therapy             5.  Mechanical thrombectomy to the right SFA and popliteal arteries with the [place] Rex device  6.  Percutaneous transluminal angioplasty of the right SFA and popliteal arteries with 2 inflations with a 5 mm diameter by 30 cm length Lutonix drug-coated angioplasty balloon             7.  Placement of a left femoral triple-lumen catheter with ultrasound guidance  EBL: 50 cc  Contrast: 40 cc  Fluoro Time: 10.3 minutes  Moderate Conscious Sedation Time: approximately 61 minutes using 3 mg of Versed and 150 mcg of Fentanyl              Indications:  Patient is a 51 y.o.female with rest Carroll of the right foot. The patient has noninvasive study showing occlusion of her previous intervention with a markedly reduced right ABI of 0.2. The patient is brought in for angiography for further evaluation and potential treatment.  Due to the limb threatening nature of the situation, angiogram was performed for attempted limb salvage. The patient is aware that if the procedure fails, amputation would be expected.  The patient also understands that even with successful revascularization, amputation may still be required due  to the severity of the situation.  Risks and benefits are discussed and informed consent is obtained.   Procedure:  The patient was identified and appropriate procedural time out was performed.  The patient was then placed supine on the table and prepped and draped in the usual sterile fashion. Moderate conscious sedation was administered during a face to face encounter with the patient throughout the procedure with my supervision of the RN administering medicines and monitoring the patient's vital signs, pulse oximetry, telemetry and mental status throughout from the start of the procedure until the patient was taken to the recovery room. Ultrasound was used to evaluate the left common femoral artery.  It was patent .  A digital ultrasound image was acquired.  A Seldinger needle was used to access the left common femoral artery under direct ultrasound guidance and a permanent image was performed.  A 0.035 J wire was advanced without resistance and a 5Fr sheath was placed.  Pigtail catheter was placed into the aorta and an AP aortogram was performed. This demonstrated normal renal arteries and normal aorta and iliac segments without significant stenosis. I then crossed the aortic bifurcation and advanced to the right femoral head. Selective right lower extremity angiogram was then performed. This demonstrated a flush occlusion of the right  SFA above the previously placed stents with complete occlusion of the right SFA and reconstitution of the below-knee popliteal artery with what appeared to be three-vessel runoff distally although there was some disease of the tibial vessels.  They were difficult to opacify completely. It was felt that it was in the patient's best interest to proceed with intervention after these images to avoid a second procedure and a larger amount of contrast and fluoroscopy based off of the findings from the initial angiogram. The patient was systemically heparinized and a 6 [place]  Destination sheath was then placed over the [employer] wire. I then used a Kumpe catheter and the advantage wire to easily navigate through the right SFA and popliteal arteries into the below-knee popliteal artery where intraluminal flow was confirmed and better opacification of the tibial vessels was seen.  I then placed 8 mg of tPA in the right SFA and popliteal arteries through the Kumpe catheter.  A V18 wire was then parked in the peroneal artery and the Kumpe catheter was removed.  Mechanical thrombectomy was performed with several passes with the [place] Rex device in the right SFA and popliteal arteries.  Significant thrombus was removed, but there remained continued occlusion and sluggish flow.  I then performed angioplasty of the right SFA and popliteal arteries with 2 inflations with a 5 mm diameter by 30 cm length Lutonix drug-coated angioplasty balloon.  Completion imaging following this showed continued occlusion and now more sluggish distal flow.  I felt our only hope for restoring patency at this point would be with catheter directed thrombolytic therapy in a continuous fashion.  A 135 cm total length 50 cm working length catheter was then parked from the proximal posterior tibial artery up to the tibioperoneal trunk, popliteal artery, and entire SFA.  This was secured into position with a Prolene suture as was the sheath.  An additional 4 mg of tPA was delivered through the catheter and then a continuous tPA infusion will be run.  I then placed a central line for blood draws and durable venous access while running tPA.  The left femoral vein was visualized and found to be widely patent.  It was then accessed under direct ultrasound guidance without difficulty with a Seldinger needle.  A permanent image was recorded.  A J-wire was placed.  After skin nick and dilatation a triple-lumen catheter was placed over the wire and the wire was removed.  All 3 lumens withdrew with dark red nonpulsatile blood  and flushed easily with sterile saline and it was secured into place with 2 silk sutures. I elected to terminate the procedure. The patient was taken to the recovery room in stable condition having tolerated the procedure well.  Findings:               Aortogram:  This demonstrated normal renal arteries and normal aorta and iliac segments without significant stenosis.             Right lower Extremity: This demonstrated a flush occlusion of the right SFA above the previously placed stents with complete occlusion of the right SFA and reconstitution of the below-knee popliteal artery with what appeared to be three-vessel runoff distally although there was some disease of the tibial vessels.  They were difficult to opacify completely.   Disposition: Patient was taken to the recovery room in stable condition having tolerated the procedure well.  Complications: None  Beth Carroll 08/19/2021 9:43 AM   This note was created with [employer]  Medical transcription system. Any errors in dictation are purely unintentional.

## 2021-08-19 NOTE — Progress Notes (Signed)
h and p from clinic note 08/16/21 is up to date and no changes. For right leg angio and intervention today.

## 2021-08-20 ENCOUNTER — Encounter: Payer: Self-pay | Admitting: Vascular Surgery

## 2021-08-20 LAB — URINALYSIS, ROUTINE W REFLEX MICROSCOPIC
Bacteria, UA: NONE SEEN
Bilirubin Urine: NEGATIVE
Glucose, UA: >=500 mg/dL — AB
Ketones, ur: 80 mg/dL — AB
Nitrite: NEGATIVE
Protein, ur: 100 mg/dL — AB
Specific Gravity, Urine: 1.015 (ref 1.005–1.030)
WBC, UA: >50 WBC/hpf — ABNORMAL HIGH (ref 0–5)
pH: 5 (ref 5.0–8.0)

## 2021-08-20 LAB — CBC
HCT: 38.7 % (ref 36.0–46.0)
Hemoglobin: 11.9 g/dL — ABNORMAL LOW (ref 12.0–15.0)
MCH: 28.6 pg (ref 26.0–34.0)
MCHC: 30.7 g/dL (ref 30.0–36.0)
MCV: 93 fL (ref 80.0–100.0)
Platelets: 219 10*3/uL (ref 150–400)
RBC: 4.16 MIL/uL (ref 3.87–5.11)
RDW: 13.8 % (ref 11.5–15.5)
WBC: 18.6 10*3/uL — ABNORMAL HIGH (ref 4.0–10.5)
nRBC: 0 % (ref 0.0–0.2)

## 2021-08-20 LAB — BASIC METABOLIC PANEL
Anion gap: 18 — ABNORMAL HIGH (ref 5–15)
BUN: 29 mg/dL — ABNORMAL HIGH (ref 6–20)
CO2: 9 mmol/L — ABNORMAL LOW (ref 22–32)
Calcium: 8.3 mg/dL — ABNORMAL LOW (ref 8.9–10.3)
Chloride: 108 mmol/L (ref 98–111)
Creatinine, Ser: 1.14 mg/dL — ABNORMAL HIGH (ref 0.44–1.00)
GFR, Estimated: 58 mL/min — ABNORMAL LOW (ref >60)
Glucose, Bld: 427 mg/dL — ABNORMAL HIGH (ref 70–99)
Potassium: 4.1 mmol/L (ref 3.5–5.1)
Sodium: 135 mmol/L (ref 135–145)

## 2021-08-20 LAB — HEMOGLOBIN A1C
Hgb A1c MFr Bld: 13.3 % — ABNORMAL HIGH (ref 4.8–5.6)
Mean Plasma Glucose: 335.01 mg/dL

## 2021-08-20 LAB — GLUCOSE, CAPILLARY
Glucose-Capillary: 400 mg/dL — ABNORMAL HIGH (ref 70–99)
Glucose-Capillary: 400 mg/dL — ABNORMAL HIGH (ref 70–99)
Glucose-Capillary: 350 mg/dL — ABNORMAL HIGH (ref 70–99)
Glucose-Capillary: 328 mg/dL — ABNORMAL HIGH (ref 70–99)

## 2021-08-20 MED ORDER — INSULIN GLARGINE-YFGN 100 UNIT/ML ~~LOC~~ SOLN
15.0000 [IU] | Freq: Every day | SUBCUTANEOUS | Status: DC
  Administered 2021-08-20: 15 [IU] via SUBCUTANEOUS
  Filled 2021-08-20: qty 0.15

## 2021-08-20 MED ORDER — INSULIN GLARGINE-YFGN 100 UNIT/ML ~~LOC~~ SOLN
15.0000 [IU] | Freq: Every day | SUBCUTANEOUS | Status: DC
Start: 2021-08-21 — End: 2021-08-26
  Administered 2021-08-21 – 2021-08-25 (×6): 15 [IU] via SUBCUTANEOUS
  Filled 2021-08-20 (×7): qty 0.15

## 2021-08-20 MED ORDER — TIOTROPIUM BROMIDE MONOHYDRATE 18 MCG IN CAPS
18.0000 ug | ORAL_CAPSULE | Freq: Every day | RESPIRATORY_TRACT | Status: DC
  Administered 2021-08-20 – 2021-08-26 (×7): 18 ug via RESPIRATORY_TRACT
  Filled 2021-08-20 (×2): qty 5

## 2021-08-20 MED ORDER — LIVING WELL WITH DIABETES BOOK
Freq: Once | Status: AC
  Filled 2021-08-20: qty 1

## 2021-08-20 MED ORDER — SULFAMETHOXAZOLE-TRIMETHOPRIM 800-160 MG PO TABS
1.0000 | ORAL_TABLET | Freq: Two times a day (BID) | ORAL | Status: AC
  Administered 2021-08-20 – 2021-08-22 (×6): 1 via ORAL
  Filled 2021-08-20 (×6): qty 1

## 2021-08-20 MED ORDER — INSULIN ASPART 100 UNIT/ML IJ SOLN
3.0000 [IU] | Freq: Three times a day (TID) | INTRAMUSCULAR | Status: DC
  Administered 2021-08-20 – 2021-08-26 (×17): 3 [IU] via SUBCUTANEOUS
  Filled 2021-08-20 (×16): qty 1

## 2021-08-20 MED ORDER — INSULIN ASPART 100 UNIT/ML IJ SOLN
0.0000 [IU] | Freq: Three times a day (TID) | INTRAMUSCULAR | Status: DC
  Administered 2021-08-21: 3 [IU] via SUBCUTANEOUS
  Administered 2021-08-21: 2 [IU] via SUBCUTANEOUS
  Administered 2021-08-21: 8 [IU] via SUBCUTANEOUS
  Administered 2021-08-22: 3 [IU] via SUBCUTANEOUS
  Administered 2021-08-22: 2 [IU] via SUBCUTANEOUS
  Administered 2021-08-23: 3 [IU] via SUBCUTANEOUS
  Administered 2021-08-23: 2 [IU] via SUBCUTANEOUS
  Administered 2021-08-24: 5 [IU] via SUBCUTANEOUS
  Administered 2021-08-24: 3 [IU] via SUBCUTANEOUS
  Administered 2021-08-25: 5 [IU] via SUBCUTANEOUS
  Administered 2021-08-25: 8 [IU] via SUBCUTANEOUS
  Administered 2021-08-25: 5 [IU] via SUBCUTANEOUS
  Administered 2021-08-26: 11 [IU] via SUBCUTANEOUS
  Administered 2021-08-26: 8 [IU] via SUBCUTANEOUS
  Filled 2021-08-20 (×11): qty 1

## 2021-08-20 NOTE — Inpatient Diabetes Management (Signed)
Inpatient Diabetes Program Recommendations  AACE/ADA: New Consensus Statement on Inpatient Glycemic Control (2015)  Target Ranges:  Prepandial:   less than 140 mg/dL      Peak postprandial:   less than 180 mg/dL (1-2 hours)      Critically ill patients:  140 - 180 mg/dL   Lab Results  Component Value Date   GLUCAP 400 (H) 08/20/2021   HGBA1C 13.3 (H) 08/19/2021    Review of Glycemic Control  Latest Reference Range & Units 08/19/21 09:43 08/19/21 18:42 08/19/21 20:35 08/20/21 07:35  Glucose-Capillary 70 - 99 mg/dL 300 (H) 287 (H) 308 (H) 400 (H)  (H): Data is abnormally high  Diabetes history: DM2 Outpatient Diabetes medications: Lantus 25 units QD, Novolog 5 units TID Current orders for Inpatient glycemic control: Novolog 0-9 units TID  Inpatient Diabetes Program Recommendations:    Spoke with patient briefly.  She is not feeling well; confirmed above home medications.    Please consider:  -Semglee 15 units QD -Novolog 3 units TID with meals -Carb modified diet  Ordered LWWD booklet.  Will speak with her regarding her A1C of 13.3% when she is feeling better.    Will continue to follow while inpatient.  Thank you, Reche Dixon, MSN, [place] Diabetes Coordinator Inpatient Diabetes Program [phone] (team pager from 8a-5p)

## 2021-08-20 NOTE — Progress Notes (Signed)
[facility]  Daily Progress Note   Subjective  -   Patient fatigued but with some nausea and vomiting  Objective Vitals:   08/20/21 1230 08/20/21 1717 08/20/21 1941 08/20/21 2345  BP: (!) 86/62 93/65 134/84 104/66  Pulse: 86 86 87 86  Resp: '16 16 18 19  '$ Temp: 97.9 F (36.6 C) 97.9 F (36.6 C) 98.3 F (36.8 C) 98.3 F (36.8 C)  TempSrc:  Oral Oral Oral  SpO2: 100% 100% 100% 96%  Weight:      Height:        Intake/Output Summary (Last 24 hours) at 08/20/2021 2351 Last data filed at 08/20/2021 1941 Gross per 24 hour  Intake 1690.25 ml  Output 2700 ml  Net -1009.75 ml    PULM  CTAB CV  RRR VASC  dopplerable DP/PT pulses of right lower extremity, some discoloration continues.  Laboratory CBC    Component Value Date/Time   WBC 18.6 (H) 08/20/2021 1254   HGB 11.9 (L) 08/20/2021 1254   HCT 38.7 08/20/2021 1254   PLT 219 08/20/2021 1254    BMET    Component Value Date/Time   NA 135 08/20/2021 1254   NA 137 03/18/2021 1541   K 4.1 08/20/2021 1254   CL 108 08/20/2021 1254   CO2 9 (L) 08/20/2021 1254   GLUCOSE 427 (H) 08/20/2021 1254   BUN 29 (H) 08/20/2021 1254   BUN 15 03/18/2021 1541   CREATININE 1.14 (H) 08/20/2021 1254   CALCIUM 8.3 (L) 08/20/2021 1254   GFRNONAA 58 (L) 08/20/2021 1254    Assessment/Planning: POD #1 s/p right lower extremity angiogram  Patient still having fatigue as well as nausea with vomiting Patient's intervention ended later yesterday and still has Aggrastat as well as PAD device in place.  Based on this we will have the patient remain overnight. Patient continues to have elevated blood glucose levels which may be contributing to her nausea.  Her A1c is also 13.3.  Not only will this caused her to have an expected elevated blood glucose levels but it is also dangerous for the patency of her stents to continue to have elevated blood glucose levels.  We will continue to trend and monitor aggressively.  Appreciate  diabetes coordinator recommendations Patient has evidence of UTI on urinalysis as well as evidence of elevated white blood cells today.  We will start the patient on Bactrim.  Kris Hartmann  08/20/2021, 11:51 PM

## 2021-08-21 ENCOUNTER — Inpatient Hospital Stay: Payer: No Typology Code available for payment source

## 2021-08-21 ENCOUNTER — Observation Stay: Payer: No Typology Code available for payment source

## 2021-08-21 DIAGNOSIS — S06360A Traumatic hemorrhage of cerebrum, unspecified, without loss of consciousness, initial encounter: Secondary | ICD-10-CM

## 2021-08-21 DIAGNOSIS — Z7902 Long term (current) use of antithrombotics/antiplatelets: Secondary | ICD-10-CM | POA: Diagnosis not present

## 2021-08-21 DIAGNOSIS — I2510 Atherosclerotic heart disease of native coronary artery without angina pectoris: Secondary | ICD-10-CM | POA: Diagnosis present

## 2021-08-21 DIAGNOSIS — I255 Ischemic cardiomyopathy: Secondary | ICD-10-CM | POA: Diagnosis present

## 2021-08-21 DIAGNOSIS — E1151 Type 2 diabetes mellitus with diabetic peripheral angiopathy without gangrene: Secondary | ICD-10-CM | POA: Diagnosis present

## 2021-08-21 DIAGNOSIS — Z7982 Long term (current) use of aspirin: Secondary | ICD-10-CM | POA: Diagnosis not present

## 2021-08-21 DIAGNOSIS — I998 Other disorder of circulatory system: Secondary | ICD-10-CM

## 2021-08-21 DIAGNOSIS — Z79899 Other long term (current) drug therapy: Secondary | ICD-10-CM | POA: Diagnosis not present

## 2021-08-21 DIAGNOSIS — I5022 Chronic systolic (congestive) heart failure: Secondary | ICD-10-CM | POA: Diagnosis present

## 2021-08-21 DIAGNOSIS — D329 Benign neoplasm of meninges, unspecified: Secondary | ICD-10-CM | POA: Diagnosis present

## 2021-08-21 DIAGNOSIS — I70221 Atherosclerosis of native arteries of extremities with rest pain, right leg: Secondary | ICD-10-CM | POA: Diagnosis present

## 2021-08-21 DIAGNOSIS — E785 Hyperlipidemia, unspecified: Secondary | ICD-10-CM | POA: Diagnosis present

## 2021-08-21 DIAGNOSIS — Z8249 Family history of ischemic heart disease and other diseases of the circulatory system: Secondary | ICD-10-CM | POA: Diagnosis not present

## 2021-08-21 DIAGNOSIS — Z794 Long term (current) use of insulin: Secondary | ICD-10-CM | POA: Diagnosis not present

## 2021-08-21 DIAGNOSIS — Z87891 Personal history of nicotine dependence: Secondary | ICD-10-CM | POA: Diagnosis not present

## 2021-08-21 DIAGNOSIS — E1165 Type 2 diabetes mellitus with hyperglycemia: Secondary | ICD-10-CM | POA: Diagnosis not present

## 2021-08-21 DIAGNOSIS — Z9889 Other specified postprocedural states: Secondary | ICD-10-CM | POA: Diagnosis not present

## 2021-08-21 DIAGNOSIS — D689 Coagulation defect, unspecified: Secondary | ICD-10-CM | POA: Diagnosis present

## 2021-08-21 DIAGNOSIS — I110 Hypertensive heart disease with heart failure: Secondary | ICD-10-CM | POA: Diagnosis present

## 2021-08-21 DIAGNOSIS — Z95828 Presence of other vascular implants and grafts: Secondary | ICD-10-CM | POA: Diagnosis not present

## 2021-08-21 LAB — CBC
HCT: 36.3 % (ref 36.0–46.0)
Hemoglobin: 11.5 g/dL — ABNORMAL LOW (ref 12.0–15.0)
MCH: 28.3 pg (ref 26.0–34.0)
MCHC: 31.7 g/dL (ref 30.0–36.0)
MCV: 89.4 fL (ref 80.0–100.0)
Platelets: 165 10*3/uL (ref 150–400)
RBC: 4.06 MIL/uL (ref 3.87–5.11)
RDW: 14 % (ref 11.5–15.5)
WBC: 12.9 10*3/uL — ABNORMAL HIGH (ref 4.0–10.5)
nRBC: 0 % (ref 0.0–0.2)

## 2021-08-21 LAB — BASIC METABOLIC PANEL
Anion gap: 9 (ref 5–15)
BUN: 30 mg/dL — ABNORMAL HIGH (ref 6–20)
CO2: 17 mmol/L — ABNORMAL LOW (ref 22–32)
Calcium: 8.7 mg/dL — ABNORMAL LOW (ref 8.9–10.3)
Chloride: 113 mmol/L — ABNORMAL HIGH (ref 98–111)
Creatinine, Ser: 0.78 mg/dL (ref 0.44–1.00)
GFR, Estimated: >60 mL/min (ref >60)
Glucose, Bld: 299 mg/dL — ABNORMAL HIGH (ref 70–99)
Potassium: 3.8 mmol/L (ref 3.5–5.1)
Sodium: 139 mmol/L (ref 135–145)

## 2021-08-21 LAB — GLUCOSE, CAPILLARY
Glucose-Capillary: 265 mg/dL — ABNORMAL HIGH (ref 70–99)
Glucose-Capillary: 174 mg/dL — ABNORMAL HIGH (ref 70–99)
Glucose-Capillary: 136 mg/dL — ABNORMAL HIGH (ref 70–99)
Glucose-Capillary: 123 mg/dL — ABNORMAL HIGH (ref 70–99)

## 2021-08-21 MED ORDER — STROKE: EARLY STAGES OF RECOVERY BOOK
Freq: Once | Status: AC
Start: 2021-08-22 — End: 2021-08-22

## 2021-08-21 MED ORDER — GADOBUTROL 1 MMOL/ML IV SOLN
5.0000 mL | Freq: Once | INTRAVENOUS | Status: AC | PRN
  Administered 2021-08-21: 5 mL via INTRAVENOUS

## 2021-08-21 MED ORDER — APIXABAN 5 MG PO TABS
5.0000 mg | ORAL_TABLET | Freq: Two times a day (BID) | ORAL | Status: DC
  Administered 2021-08-21 – 2021-08-26 (×10): 5 mg via ORAL
  Filled 2021-08-21 (×10): qty 1

## 2021-08-21 MED ORDER — PANTOPRAZOLE SODIUM 40 MG IV SOLR
40.0000 mg | Freq: Every day | INTRAVENOUS | Status: DC
  Administered 2021-08-21 – 2021-08-22 (×2): 40 mg via INTRAVENOUS
  Filled 2021-08-21 (×2): qty 10

## 2021-08-21 MED ORDER — SENNOSIDES-DOCUSATE SODIUM 8.6-50 MG PO TABS
1.0000 | ORAL_TABLET | Freq: Two times a day (BID) | ORAL | Status: DC
Start: 2021-08-21 — End: 2021-08-26
  Administered 2021-08-22 – 2021-08-25 (×7): 1 via ORAL
  Filled 2021-08-21 (×9): qty 1

## 2021-08-21 MED ORDER — TRAMADOL HCL 50 MG PO TABS
50.0000 mg | ORAL_TABLET | Freq: Four times a day (QID) | ORAL | Status: DC | PRN
  Administered 2021-08-22 – 2021-08-26 (×12): 50 mg via ORAL
  Filled 2021-08-21 (×13): qty 1

## 2021-08-21 NOTE — Progress Notes (Signed)
At the start of shift patient is alert and oriented and on oxygen. She had a CT performed earlier in the day and from outgoing nurse, she has another at 9 pm.  [facility]- [phone]) called at 8 pm and stated that patient most likely has a Meningioma. Attempted to give report to Dr. Rory Percy who ordered CT scan. Dr Lowella Grip who is on call was contacted.States that patient should restart Eliquis since it is most likely a meningioma. Will update patient's chart accordingly.  Patient is currently leaving the unit for her second CT scheduled at 9 pm

## 2021-08-21 NOTE — Progress Notes (Signed)
PT Cancellation Note  Patient Details Name: Beth Carroll MRN: [id number] DOB: 1971/01/06   Cancelled Treatment:    Reason Eval/Treat Not Completed: Fatigue/lethargy limiting ability to participate;Patient's level of consciousness (Have attempted evaluation twice this date, pt remains heavily somnolent, very drowsy when awake, no ideal for accurate assessment of AMB and basic mobility. WIll attempt again at later date/time as able.)  1:14 PM, 08/21/21 Etta Grandchild, PT, DPT Physical Therapist - [facility]  [phone] ([place])    Dannika Hilgeman C 08/21/2021, 1:14 PM

## 2021-08-21 NOTE — Progress Notes (Signed)
[facility]  Daily Progress Note   Subjective  -   Patient remains fatigued and somnolent however awakes to stimuli and answers questions appropriately.  Objective Vitals:   08/20/21 2345 08/21/21 0426 08/21/21 0758 08/21/21 1127  BP: 104/66 106/64 106/63 102/64  Pulse: 86 83 84 79  Resp: '19 18 18 17  '$ Temp: 98.3 F (36.8 C) 97.6 F (36.4 C) (!) 97.4 F (36.3 C) 97.7 F (36.5 C)  TempSrc: Oral Oral Oral   SpO2: 96% 94% 100% 98%  Weight:      Height:        Intake/Output Summary (Last 24 hours) at 08/21/2021 1559 Last data filed at 08/21/2021 1410 Gross per 24 hour  Intake 1140 ml  Output 1200 ml  Net -60 ml    PULM  CTAB CV  RRR VASC  dopplerable DP/PT pulses bilaterally  Laboratory CBC    Component Value Date/Time   WBC 12.9 (H) 08/21/2021 0444   HGB 11.5 (L) 08/21/2021 0444   HCT 36.3 08/21/2021 0444   PLT 165 08/21/2021 0444    BMET    Component Value Date/Time   NA 139 08/21/2021 0444   NA 137 03/18/2021 1541   K 3.8 08/21/2021 0444   CL 113 (H) 08/21/2021 0444   CO2 17 (L) 08/21/2021 0444   GLUCOSE 299 (H) 08/21/2021 0444   BUN 30 (H) 08/21/2021 0444   BUN 15 03/18/2021 1541   CREATININE 0.78 08/21/2021 0444   CALCIUM 8.7 (L) 08/21/2021 0444   GFRNONAA >60 08/21/2021 0444    Assessment/Planning: POD #2 s/p right lower extremity angiogram   Patient still very fatigued with both morning and afternoon evaluation.  Initially was felt that this may be due to pain medication however in preparation ford discharge work the patient however is still too somnolent to be work with PT, based on this there is concern that given the patient's anticoagulation infusions as well as tPA there was concern for possible brain bleeding.  The patient underwent a recent CT head which shows an area of likely acute hemorrhage in the superior left occipital lobe.  Neurology has been consulted. Nausea and vomiting is improved today per patient Blood  glucose levels are also improved with recent medication changes. Patient's white blood cell count is trending down Patient status has been changed to observation inpatient given this new development.  Kris Hartmann  08/21/2021, 3:59 PM

## 2021-08-21 NOTE — Progress Notes (Signed)
Verbal order placed by Dr Lowella Grip for patient to restart Eliquis.

## 2021-08-21 NOTE — Consult Note (Addendum)
Neurology Consultation  Reason for Consult: [place] Referring Physician: Dr. Delana Meyer  CC: Abnormal imaging  History is obtained from: Patient, family, chart  HPI: Beth Carroll is a 51 y.o. female past medical history of coronary artery disease, diabetes, heart failure with reduced ejection fraction, hyperlipidemia, hypertension, peripheral arterial disease, tobacco abuse, ischemic cardiomyopathy, admitted for right lower extremity thrombectomy which was done on 08/19/2021.  The on-call neurosurgeon was called today because the floor care team felt that the patient was more drowsy.  Head CT was done that shows a small area of hyperdensity concerning for ICH in the left occipital lobe.  Neurological consultation was obtained for the [place]. Patient denies any headache.  Denies any focal tingling numbness or weakness.  Denies any visual symptoms.  She denies any history of trauma.  She is currently on Eliquis and aspirin-last dose of both given at 10 AM this morning.   LKW: Prior to the surgery 2 days ago IV thrombolysis given?: no, ICH, on Eliquis Premorbid modified Rankin scale (mRS): 2 ICH score 0   ROS: Full ROS was performed and is negative except as noted in the HPI.   Past Medical History:  Diagnosis Date   Coronary artery disease    Diabetes mellitus without complication ([place])    Diabetes mellitus, type 2 (HCC)    HFrEF (heart failure with reduced ejection fraction) ([place])    Hyperlipemia    Hypertension    Ischemic cardiomyopathy    Orthostatic hypotension    PAD (peripheral artery disease) (HCC)    Tobacco use      Family History  Problem Relation Age of Onset   Hypertension Father      Social History:   reports that she quit smoking about 8 months ago. Her smoking use included cigarettes. She has never used smokeless tobacco. She reports that she does not drink alcohol. No history on file for drug use.  Medications  Current Facility-Administered Medications:    0.9 %   sodium chloride infusion, , Intravenous, Continuous, Dew, Erskine Squibb, MD, Last Rate: 75 mL/hr at 08/21/21 0840, New Bag at 08/21/21 0840   acetaminophen (TYLENOL) tablet 325-650 mg, 325-650 mg, Oral, Q4H PRN **OR** acetaminophen (TYLENOL) suppository 325-650 mg, 325-650 mg, Rectal, Q4H PRN, Lucky Cowboy, Erskine Squibb, MD   albuterol (PROVENTIL) (2.5 MG/3ML) 0.083% nebulizer solution 3 mL, 3 mL, Nebulization, Q6H PRN, Dew, Erskine Squibb, MD   alum & mag hydroxide-simeth (MAALOX/MYLANTA) 200-200-20 MG/5ML suspension 15-30 mL, 15-30 mL, Oral, Q2H PRN, Dew, Erskine Squibb, MD   apixaban (ELIQUIS) tablet 5 mg, 5 mg, Oral, BID, Dew, Erskine Squibb, MD, 5 mg at 08/21/21 0938   aspirin EC tablet 81 mg, 81 mg, Oral, Daily, Algernon Huxley, MD, 81 mg at 08/21/21 0958   atorvastatin (LIPITOR) tablet 20 mg, 20 mg, Oral, Daily, Dew, Erskine Squibb, MD, 20 mg at 08/21/21 0958   carvedilol (COREG) tablet 3.125 mg, 3.125 mg, Oral, BID WC, Algernon Huxley, MD, 3.125 mg at 08/21/21 1829   furosemide (LASIX) tablet 20 mg, 20 mg, Oral, Daily, Dew, Erskine Squibb, MD, 20 mg at 08/21/21 0958   guaiFENesin-dextromethorphan (ROBITUSSIN DM) 100-10 MG/5ML syrup 15 mL, 15 mL, Oral, Q4H PRN, Dew, Erskine Squibb, MD   hydrALAZINE (APRESOLINE) injection 5 mg, 5 mg, Intravenous, Q20 Min PRN, Dew, Erskine Squibb, MD   insulin aspart (novoLOG) injection 0-15 Units, 0-15 Units, Subcutaneous, TID WC, Kris Hartmann, NP, 3 Units at 08/21/21 1218   insulin aspart (novoLOG) injection 3 Units, 3 Units,  Subcutaneous, TID WC, Kris Hartmann, NP, 3 Units at 08/21/21 1217   insulin glargine-yfgn (SEMGLEE) injection 15 Units, 15 Units, Subcutaneous, QHS, Kris Hartmann, NP, 15 Units at 08/21/21 0050   labetalol (NORMODYNE) injection 10 mg, 10 mg, Intravenous, Q10 min PRN, Lucky Cowboy, Erskine Squibb, MD   losartan (COZAAR) tablet 12.5 mg, 12.5 mg, Oral, Daily, Lucky Cowboy, Erskine Squibb, MD, 12.5 mg at 08/21/21 0959   metoprolol tartrate (LOPRESSOR) injection 2-5 mg, 2-5 mg, Intravenous, Q2H PRN, Lucky Cowboy, Erskine Squibb, MD   ondansetron [facility])  injection 4 mg, 4 mg, Intravenous, Q6H PRN, Algernon Huxley, MD, 4 mg at 08/19/21 2007   ondansetron (ZOFRAN) injection 4 mg, 4 mg, Intravenous, Q6H PRN, Algernon Huxley, MD, 4 mg at 08/20/21 0900   pantoprazole (PROTONIX) EC tablet 40 mg, 40 mg, Oral, Daily, Algernon Huxley, MD, 40 mg at 08/21/21 0958   phenol (CHLORASEPTIC) mouth spray 1 spray, 1 spray, Mouth/Throat, PRN, Lucky Cowboy, Erskine Squibb, MD   potassium chloride SA (KLOR-CON M) CR tablet 20-40 mEq, 20-40 mEq, Oral, Once, Lucky Cowboy, Erskine Squibb, MD   senna-docusate (Senokot-S) tablet 1 tablet, 1 tablet, Oral, QHS PRN, Lucky Cowboy, Erskine Squibb, MD   sorbitol 70 % solution 30 mL, 30 mL, Oral, Daily PRN, Lucky Cowboy, Erskine Squibb, MD   sulfamethoxazole-trimethoprim (BACTRIM DS) 800-160 MG per tablet 1 tablet, 1 tablet, Oral, Q12H, Kris Hartmann, NP, 1 tablet at 08/21/21 0958   tiotropium [facility]) inhalation capsule ([facility] use ONLY) 18 mcg, 18 mcg, Inhalation, Daily, Eulogio Ditch E, NP, 18 mcg at 08/21/21 0843   traMADol (ULTRAM) tablet 50 mg, 50 mg, Oral, Q6H PRN, Delana Meyer, Dolores Lory, MD   [START ON 08/24/2021] Vitamin D (Ergocalciferol) (DRISDOL) 1.25 MG (50000 UNIT) capsule 50,000 Units, 50,000 Units, Oral, Weekly, Lucky Cowboy, Erskine Squibb, MD   Exam: Current vital signs: BP 102/64 (BP Location: Right Arm)   Pulse 79   Temp 97.7 F (36.5 C)   Resp 17   Ht '5\' 5"'$  (1.651 m)   Wt 52.2 kg   SpO2 98%   BMI 19.14 kg/m  Vital signs in last 24 hours: Temp:  [97.4 F (36.3 C)-98.3 F (36.8 C)] 97.7 F (36.5 C) (07/28 1127) Pulse Rate:  [79-87] 79 (07/28 1127) Resp:  [16-19] 17 (07/28 1127) BP: (93-134)/(63-84) 102/64 (07/28 1127) SpO2:  [94 %-100 %] 98 % (07/28 1127) General: Somewhat thin looking woman in no acute distress, comfortably sleeping in bed HEENT: Normocephalic atraumatic Lungs: Clear Cardiovascular: Regular rhythm Abdomen nondistended nontender Extremities: Swollen and tender right lower extremity, trace edema in the left as well. Neurologic examination Sleeping in bed, opens  eyes to voice. Follows all commands.  No evidence of aphasia or dysarthria. Cranial nerves II to XII intact Motor examination with antigravity strength without vertical drift in the upper extremities.  Lower extremities-examination is limited by pain and swelling but she has antigravity strength in both lower extremities. Sensation intact to touch without extinction Coordination with no dysmetria in the upper extremities.  Difficult to perform in the lower extremities. NIH stroke scale 1a Level of Conscious.: 0 1b LOC Questions: 0 1c LOC Commands: 0 2 Best Gaze: 00 3 Visual: 0 4 Facial Palsy: 0 5a Motor Arm - left: 0 5b Motor Arm - Right: 0 6a Motor Leg - Left: 1 6b Motor Leg - Right: 1 7 Limb Ataxia: 0 8 Sensory: 0 9 Best Language: 0 10 Dysarthria: 0 11 Extinct. and Inatten.: 0 TOTAL: 2   Labs I have reviewed labs in  epic and the results pertinent to this consultation are: CBC    Component Value Date/Time   WBC 12.9 (H) 08/21/2021 0444   RBC 4.06 08/21/2021 0444   HGB 11.5 (L) 08/21/2021 0444   HCT 36.3 08/21/2021 0444   PLT 165 08/21/2021 0444   MCV 89.4 08/21/2021 0444   MCH 28.3 08/21/2021 0444   MCHC 31.7 08/21/2021 0444   RDW 14.0 08/21/2021 0444    CMP     Component Value Date/Time   NA 139 08/21/2021 0444   NA 137 03/18/2021 1541   K 3.8 08/21/2021 0444   CL 113 (H) 08/21/2021 0444   CO2 17 (L) 08/21/2021 0444   GLUCOSE 299 (H) 08/21/2021 0444   BUN 30 (H) 08/21/2021 0444   BUN 15 03/18/2021 1541   CREATININE 0.78 08/21/2021 0444   CALCIUM 8.7 (L) 08/21/2021 0444   PROT 7.4 08/19/2021 2107   ALBUMIN 4.0 08/19/2021 2107   AST 22 08/19/2021 2107   ALT 14 08/19/2021 2107   ALKPHOS 75 08/19/2021 2107   BILITOT 1.7 (H) 08/19/2021 2107   GFRNONAA >60 08/21/2021 0444    Imaging I have reviewed the images obtained:  CT-head done today: Concerning for a small area of likely acute hemorrhage in the superior left occipital lobe.  No significant  surrounding edema or associated mass effect.  To my review and I was surrounded by another colleague, this looks to be subacute.     Assessment:  51 year old with extensive vascular risk factors, who underwent right lower extremity thrombectomy 2 days ago, and is on Eliquis and aspirin was noted to be somewhat more lethargic and drowsy and a head CT was done to rule out an intracranial process.  The head CT revealed a small area concerning for a acute hemorrhage in the superior left occipital lobe.  On our review, this appears more subacute than acute. She is on Eliquis and aspirin.  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  Given her bad vascular history, I would hold Eliquis for now but will continue aspirin.  I am also hesitant to reverse Eliquis given her poor vasculature and recent thrombectomy-discussed with the vascular surgeon who also agrees.  Repeat head CT in 6 hours.  If there is any increase in the size of the bleed, might have to consider transfer to [facility].  If she remains stable on imaging, patient with subacute strokes should be manageable at this hospital.  Family reports subjective improvement in mentation compared to yesterday so that is also reassuring.  Examination also is an NIH of 0.  Blood pressure goal systolic below 270 given subacute bleed.  Stroke work-up ordered-echo not ordered because of recent echo from June available.  Discussed with Dr Delana Meyer initially by phone and then by secure chat  Repeat CT head ordered with the instructions to inform the on-call vascular surgeon-written in the order as well as discussed with the bedside RN who is going to relay to the night team.  Neurology will follow.  -- Amie Portland, MD Neurologist [facility] Pager: [phone]

## 2021-08-22 DIAGNOSIS — D329 Benign neoplasm of meninges, unspecified: Secondary | ICD-10-CM

## 2021-08-22 LAB — BASIC METABOLIC PANEL
Anion gap: 6 (ref 5–15)
BUN: 21 mg/dL — ABNORMAL HIGH (ref 6–20)
CO2: 21 mmol/L — ABNORMAL LOW (ref 22–32)
Calcium: 9 mg/dL (ref 8.9–10.3)
Chloride: 114 mmol/L — ABNORMAL HIGH (ref 98–111)
Creatinine, Ser: 0.61 mg/dL (ref 0.44–1.00)
GFR, Estimated: >60 mL/min (ref >60)
Glucose, Bld: 96 mg/dL (ref 70–99)
Potassium: 3.1 mmol/L — ABNORMAL LOW (ref 3.5–5.1)
Sodium: 141 mmol/L (ref 135–145)

## 2021-08-22 LAB — GLUCOSE, CAPILLARY
Glucose-Capillary: 98 mg/dL (ref 70–99)
Glucose-Capillary: 180 mg/dL — ABNORMAL HIGH (ref 70–99)
Glucose-Capillary: 140 mg/dL — ABNORMAL HIGH (ref 70–99)
Glucose-Capillary: 158 mg/dL — ABNORMAL HIGH (ref 70–99)

## 2021-08-22 LAB — LIPID PANEL
Cholesterol: 107 mg/dL (ref 0–200)
HDL: 47 mg/dL (ref >40)
LDL Cholesterol: 45 mg/dL (ref 0–99)
Total CHOL/HDL Ratio: 2.3 RATIO
Triglycerides: 74 mg/dL (ref <150)
VLDL: 15 mg/dL (ref 0–40)

## 2021-08-22 LAB — CBC
HCT: 34 % — ABNORMAL LOW (ref 36.0–46.0)
Hemoglobin: 11 g/dL — ABNORMAL LOW (ref 12.0–15.0)
MCH: 28.6 pg (ref 26.0–34.0)
MCHC: 32.4 g/dL (ref 30.0–36.0)
MCV: 88.5 fL (ref 80.0–100.0)
Platelets: 147 10*3/uL — ABNORMAL LOW (ref 150–400)
RBC: 3.84 MIL/uL — ABNORMAL LOW (ref 3.87–5.11)
RDW: 14.4 % (ref 11.5–15.5)
WBC: 8.4 10*3/uL (ref 4.0–10.5)
nRBC: 0 % (ref 0.0–0.2)

## 2021-08-22 LAB — HEMOGLOBIN A1C
Hgb A1c MFr Bld: 12.9 % — ABNORMAL HIGH (ref 4.8–5.6)
Mean Plasma Glucose: 323.53 mg/dL

## 2021-08-22 MED ORDER — POTASSIUM CHLORIDE CRYS ER 20 MEQ PO TBCR
40.0000 meq | EXTENDED_RELEASE_TABLET | Freq: Once | ORAL | Status: AC
  Administered 2021-08-22: 40 meq via ORAL
  Filled 2021-08-22: qty 2

## 2021-08-22 NOTE — Progress Notes (Signed)
Neurology Progress Note   S:// Seen and examined More awake MRI done overnight which reveals that the lesion thought to be a subacute bleed is actually a meningioma. Eliquis ordered by the on-call vascular surgeon overnight.  Repeat CT head also with no acute findings.   O:// Current vital signs: BP 115/71 (BP Location: Right Arm)   Pulse 82   Temp 98.9 F (37.2 C) (Oral)   Resp 17   Ht '5\' 5"'$  (1.651 m)   Wt 52.2 kg   SpO2 100%   BMI 19.14 kg/m  Vital signs in last 24 hours: Temp:  [97.7 F (36.5 C)-98.9 F (37.2 C)] 98.9 F (37.2 C) (07/29 0724) Pulse Rate:  [79-86] 82 (07/29 0724) Resp:  [16-20] 17 (07/29 0724) BP: (93-129)/(60-76) 115/71 (07/29 0724) SpO2:  [98 %-100 %] 100 % (07/29 0724) General: Awake alert in no distress HEENT: Normal cephalic atraumatic Lungs: Clear Cardiovascular: Regular rhythm Abdomen nondistended not traumatic Neurological exam Much more awake today, oriented x3.  No dysarthria.  No aphasia. Cranial nerves II to XII intact Motor examination with antigravity symmetric strength in all 4 extremities Sensation intact to light touch Coordination exam with no dysmetria Normal neurological exam  Medications  Current Facility-Administered Medications:    0.9 %  sodium chloride infusion, , Intravenous, Continuous, Dew, Erskine Squibb, MD, Last Rate: 75 mL/hr at 08/21/21 0840, New Bag at 08/21/21 0840   acetaminophen (TYLENOL) tablet 325-650 mg, 325-650 mg, Oral, Q4H PRN, 650 mg at 08/22/21 0618 **OR** acetaminophen (TYLENOL) suppository 325-650 mg, 325-650 mg, Rectal, Q4H PRN, Lucky Cowboy, Erskine Squibb, MD   albuterol (PROVENTIL) (2.5 MG/3ML) 0.083% nebulizer solution 3 mL, 3 mL, Nebulization, Q6H PRN, Dew, Erskine Squibb, MD   alum & mag hydroxide-simeth (MAALOX/MYLANTA) 200-200-20 MG/5ML suspension 15-30 mL, 15-30 mL, Oral, Q2H PRN, Lucky Cowboy, Erskine Squibb, MD   apixaban [facility]) tablet 5 mg, 5 mg, Oral, BID, Bertram Savin, MD, 5 mg at 08/22/21 0920   aspirin EC tablet 81 mg, 81  mg, Oral, Daily, Algernon Huxley, MD, 81 mg at 08/22/21 1696   atorvastatin (LIPITOR) tablet 20 mg, 20 mg, Oral, Daily, Dew, Erskine Squibb, MD, 20 mg at 08/22/21 0919   carvedilol (COREG) tablet 3.125 mg, 3.125 mg, Oral, BID WC, Algernon Huxley, MD, 3.125 mg at 08/22/21 0920   furosemide (LASIX) tablet 20 mg, 20 mg, Oral, Daily, Dew, Jason S, MD, 20 mg at 08/22/21 0920   guaiFENesin-dextromethorphan (ROBITUSSIN DM) 100-10 MG/5ML syrup 15 mL, 15 mL, Oral, Q4H PRN, Dew, Erskine Squibb, MD   hydrALAZINE (APRESOLINE) injection 5 mg, 5 mg, Intravenous, Q20 Min PRN, Dew, Erskine Squibb, MD   insulin aspart (novoLOG) injection 0-15 Units, 0-15 Units, Subcutaneous, TID WC, Kris Hartmann, NP, 2 Units at 08/21/21 1738   insulin aspart (novoLOG) injection 3 Units, 3 Units, Subcutaneous, TID WC, Kris Hartmann, NP, 3 Units at 08/22/21 0920   insulin glargine-yfgn (SEMGLEE) injection 15 Units, 15 Units, Subcutaneous, QHS, Eulogio Ditch E, NP, 15 Units at 08/21/21 2228   labetalol (NORMODYNE) injection 10 mg, 10 mg, Intravenous, Q10 min PRN, Lucky Cowboy, Erskine Squibb, MD   losartan (COZAAR) tablet 12.5 mg, 12.5 mg, Oral, Daily, Dew, Erskine Squibb, MD, 12.5 mg at 08/22/21 0920   metoprolol tartrate (LOPRESSOR) injection 2-5 mg, 2-5 mg, Intravenous, Q2H PRN, Lucky Cowboy, Erskine Squibb, MD   ondansetron [facility]) injection 4 mg, 4 mg, Intravenous, Q6H PRN, Algernon Huxley, MD, 4 mg at 08/19/21 2007   ondansetron (ZOFRAN) injection 4 mg, 4 mg, Intravenous,  Q6H PRN, Algernon Huxley, MD, 4 mg at 08/20/21 0900   pantoprazole (PROTONIX) EC tablet 40 mg, 40 mg, Oral, Daily, Dew, Erskine Squibb, MD, 40 mg at 08/22/21 0920   pantoprazole (PROTONIX) injection 40 mg, 40 mg, Intravenous, QHS, Amie [place], MD, 40 mg at 08/21/21 2226   phenol (CHLORASEPTIC) mouth spray 1 spray, 1 spray, Mouth/Throat, PRN, Lucky Cowboy, Erskine Squibb, MD   potassium chloride SA (KLOR-CON M) CR tablet 20-40 mEq, 20-40 mEq, Oral, Once, Algernon Huxley, MD   senna-docusate (Senokot-S) tablet 1 tablet, 1 tablet, Oral, QHS PRN, Algernon Huxley, MD, 1 tablet at 08/21/21 2227   senna-docusate (Senokot-S) tablet 1 tablet, 1 tablet, Oral, BID, Amie [place], MD, 1 tablet at 08/22/21 0920   sorbitol 70 % solution 30 mL, 30 mL, Oral, Daily PRN, Algernon Huxley, MD   sulfamethoxazole-trimethoprim (BACTRIM DS) 800-160 MG per tablet 1 tablet, 1 tablet, Oral, Q12H, Kris Hartmann, NP, 1 tablet at 08/21/21 2227   tiotropium (SPIRIVA) inhalation capsule ([facility] use ONLY) 18 mcg, 18 mcg, Inhalation, Daily, Eulogio Ditch E, NP, 18 mcg at 08/22/21 0911   traMADol (ULTRAM) tablet 50 mg, 50 mg, Oral, Q6H PRN, Schnier, Dolores Lory, MD   [START ON 08/24/2021] Vitamin D (Ergocalciferol) (DRISDOL) 1.25 MG (50000 UNIT) capsule 50,000 Units, 50,000 Units, Oral, Weekly, Algernon Huxley, MD Labs CBC    Component Value Date/Time   WBC 8.4 08/22/2021 0811   RBC 3.84 (L) 08/22/2021 0811   HGB 11.0 (L) 08/22/2021 0811   HCT 34.0 (L) 08/22/2021 0811   PLT 147 (L) 08/22/2021 0811   MCV 88.5 08/22/2021 0811   MCH 28.6 08/22/2021 0811   MCHC 32.4 08/22/2021 0811   RDW 14.4 08/22/2021 0811    CMP     Component Value Date/Time   NA 141 08/22/2021 0628   NA 137 03/18/2021 1541   K 3.1 (L) 08/22/2021 0628   CL 114 (H) 08/22/2021 0628   CO2 21 (L) 08/22/2021 0628   GLUCOSE 96 08/22/2021 0628   BUN 21 (H) 08/22/2021 0628   BUN 15 03/18/2021 1541   CREATININE 0.61 08/22/2021 0628   CALCIUM 9.0 08/22/2021 0628   PROT 7.4 08/19/2021 2107   ALBUMIN 4.0 08/19/2021 2107   AST 22 08/19/2021 2107   ALT 14 08/19/2021 2107   ALKPHOS 75 08/19/2021 2107   BILITOT 1.7 (H) 08/19/2021 2107   GFRNONAA >60 08/22/2021 0628    glycosylated hemoglobin  Lipid Panel     Component Value Date/Time   CHOL 107 08/22/2021 0628   TRIG 74 08/22/2021 0628   HDL 47 08/22/2021 0628   CHOLHDL 2.3 08/22/2021 0628   VLDL 15 08/22/2021 0628   LDLCALC 45 08/22/2021 4656     Imaging I have reviewed images in epic and the results pertinent to this consultation are: MRI brain  with and without contrast reveals the area on the initial CT suspicious for a subacute bleed in the left occipital lobe is actually a meningioma based on the MRI appearance.  No evidence of bleed  Assessment: 51 year old with extensive vascular risk factor history of underwent right lower extremity thrombectomy 3 days ago, on Eliquis and aspirin was noted to be somewhat more lethargic and drowsy and a head CT was obtained to rule out an intracranial process.  The initial head CT revealed a small area in the left occipital lobe concerning for subacute hemorrhage.  Eliquis was held while further work-up was done.  MRI brain was obtained which  revealed that this is actually a meningioma and not a bleed. Eliquis resumed overnight.   Impression: Abnormal brain imaging-head CT concerning for left occipital subacute bleed, confirmed by MR to be meningioma and NOT intracerebral bleed   Recommendations: Patient's lethargy and drowsiness were probably related to the pain meds and anesthesia. Intracranially, she has a meningioma which is likely an incidental finding. The initial appearance and read on the CT of the subacute looking left occipital bleed, prompted an MRI for further evaluation and showed that the lesion thought to be the bleed is actually meningioma.  No emergent work-up from a neurological standpoint. Can have outpatient neurosurgical follow-up if needed. Inpatient neurology will be available as needed.  Please call with questions next   -- Amie Portland, MD Neurologist [facility] Pager: [phone]

## 2021-08-22 NOTE — Evaluation (Signed)
Occupational Therapy Evaluation Patient Details Name: Beth Carroll MRN: [id number] DOB: 02-08-1970 Today's Date: 08/22/2021   History of Present Illness Beth Carroll is a 51 y.o. female past medical history of coronary artery disease, diabetes, heart failure with reduced ejection fraction, hyperlipidemia, hypertension, peripheral arterial disease, tobacco abuse, ischemic cardiomyopathy, admitted for right lower extremity thrombectomy which was done on 08/19/2021.  The on-call neurosurgeon was called today because the floor care team felt that the patient was more drowsy.  Head CT was done that shows a small area of hyperdensity concerning for ICH in the left occipital lobe.  Neurological consultation was obtained for the [place]. Follow CT head scan R/O ICH.   Clinical Impression   Pt seen for OT evaluation this date.  Pt lives in a mobile home with her boyfriend, 3-4 steps to enter with a handrail.  She was independent with all tasks prior to admission and was working full time.  She presents with muscle weakness, decreased transfers, functional mobility and decreased ability to perform self care and IADL tasks.  She required max assist this date for transfers.  She would benefit from skilled OT services to maximize her safety and independence in necessary daily tasks and to reduce caregiver burden.  Based on her performance this date, recommend STR however this may be dependent on her progress.       Recommendations for follow up therapy are one component of a multi-disciplinary discharge planning process, led by the attending physician.  Recommendations may be updated based on patient status, additional functional criteria and insurance authorization.   Follow Up Recommendations  Skilled nursing-short term rehab (<3 hours/day)    Assistance Recommended at Discharge Frequent or constant Supervision/Assistance  Patient can return home with the following A lot of help with  bathing/dressing/bathroom;Assistance with cooking/housework;A lot of help with walking and/or transfers;Assist for transportation    Functional Status Assessment  Patient has had a recent decline in their functional status and demonstrates the ability to make significant improvements in function in a reasonable and predictable amount of time.  Equipment Recommendations       Recommendations for Other Services       Precautions / Restrictions Precautions Precautions: Fall Restrictions Weight Bearing Restrictions: No      Mobility Bed Mobility Overal bed mobility: Needs Assistance Bed Mobility: Supine to Sit     Supine to sit: Mod assist     General bed mobility comments: per PT, pt up to the chair on arrival for OT    Transfers Overall transfer level: Needs assistance Equipment used: Rolling walker (2 wheels) Transfers: Sit to/from Stand, Bed to chair/wheelchair/BSC Sit to Stand: Max assist Stand pivot transfers: Max assist         General transfer comment: difficulty WB on RLE, increased pain in lower extremity      Balance                                           ADL either performed or assessed with clinical judgement   ADL Overall ADL's : Needs assistance/impaired Eating/Feeding: Independent   Grooming: Set up;Sitting   Upper Body Bathing: Set up   Lower Body Bathing: Moderate assistance   Upper Body Dressing : Modified independent   Lower Body Dressing: Moderate assistance     Toilet Transfer Details (indicate cue type and reason): to be assessed  Vision Baseline Vision/History: 1 Wears glasses       Perception     Praxis      Pertinent Vitals/Pain Pain Assessment Pain Assessment: 0-10 Pain Score: 5  Pain Location: RLE, foot Pain Descriptors / Indicators: Aching     Hand Dominance Right   Extremity/Trunk Assessment Upper Extremity Assessment Upper Extremity Assessment: Generalized  weakness   Lower Extremity Assessment Lower Extremity Assessment: Defer to PT evaluation       Communication Communication Communication: No difficulties   Cognition Arousal/Alertness: Awake/alert Behavior During Therapy: WFL for tasks assessed/performed Overall Cognitive Status: Within Functional Limits for tasks assessed                                       General Comments  edema, redness and numbness in RLE below knee    Exercises     Shoulder Instructions      Home Living Family/patient expects to be discharged to:: Private residence Living Arrangements: Spouse/significant other Available Help at Discharge: Available PRN/intermittently;Family Type of Home: Mobile home Home Access: Stairs to enter Entrance Stairs-Number of Steps: 1 Entrance Stairs-Rails: Can reach both Home Layout: One level     Bathroom Shower/Tub: Teacher, early years/pre: Standard     Home Equipment: Cane - Holiday representative (2 wheels);BSC/3in1   Additional Comments: not using any DME prior to admission. no recent fall balance issues.      Prior Functioning/Environment Prior Level of Function : Independent/Modified Independent;Working/employed;Driving             Mobility Comments: MOD I-I ADLs Comments: pt reports independent in ADL/IADL PTA; works in a factory        OT Problem List: Decreased strength;Decreased activity tolerance;Decreased knowledge of use of DME or AE;Pain      OT Treatment/Interventions: Self-care/ADL training;Therapeutic exercise;Therapeutic activities;Patient/family education    OT Goals(Current goals can be found in the care plan section) Acute Rehab OT Goals Patient Stated Goal: Pt would like to return home and be as independent as possible OT Goal Formulation: With patient Time For Goal Achievement: 09/05/21 Potential to Achieve Goals: Good ADL Goals Pt Will Perform Lower Body Bathing: with modified independence Pt Will  Perform Lower Body Dressing: with modified independence Pt Will Transfer to Toilet: with min guard assist  OT Frequency: Min 2X/week    Co-evaluation              AM-PAC OT "6 Clicks" Daily Activity     Outcome Measure Help from another person eating meals?: None Help from another person taking care of personal grooming?: None Help from another person toileting, which includes using toliet, bedpan, or urinal?: A Lot Help from another person bathing (including washing, rinsing, drying)?: A Lot Help from another person to put on and taking off regular upper body clothing?: None Help from another person to put on and taking off regular lower body clothing?: A Lot 6 Click Score: 18   End of Session    Activity Tolerance: Patient limited by pain;Patient limited by fatigue Patient left: in chair;with call bell/phone within reach;with family/visitor present  OT Visit Diagnosis: Unsteadiness on feet (R26.81);Muscle weakness (generalized) (M62.81);Pain Pain - Right/Left: Right Pain - part of body: Leg                Time: 8938-1017 OT Time Calculation (min): 17 min Charges:  OT General Charges $OT Visit: 1 Visit  OT Evaluation $OT Eval Low Complexity: 1 Low Fuad Forget T Beth Carroll, OTR/L, CLT  08/22/2021, 12:32 PM

## 2021-08-22 NOTE — Progress Notes (Signed)
Subjective: Interval History: has no complaint of pain.  She is more awake and alert than reported yesterday.  Objective: Vital signs in last 24 hours: Temp:  [97.7 F (36.5 C)-98.9 F (37.2 C)] 97.7 F (36.5 C) (07/29 1054) Pulse Rate:  [79-86] 81 (07/29 1054) Resp:  [16-20] 17 (07/29 1054) BP: (91-129)/(60-76) 91/61 (07/29 1054) SpO2:  [98 %-100 %] 100 % (07/29 1054)  Intake/Output from previous day: 07/28 0701 - 07/29 0700 In: -  Out: 1400 [Urine:1400] Intake/Output this shift: Total I/O In: 240 [P.O.:240] Out: 400 [Urine:400]  General appearance: alert and no distress Resp: Normal respiratory effort Extremities: Right leg warm and well-perfused.  Normal capillary refill.  Lab Results: Recent Labs    08/21/21 0444 08/22/21 0811  WBC 12.9* 8.4  HGB 11.5* 11.0*  HCT 36.3 34.0*  PLT 165 147*   BMET Recent Labs    08/21/21 0444 08/22/21 0628  NA 139 141  K 3.8 3.1*  CL 113* 114*  CO2 17* 21*  GLUCOSE 299* 96  BUN 30* 21*  CREATININE 0.78 0.61  CALCIUM 8.7* 9.0    Studies/Results: CT HEAD WO CONTRAST (5MM)  Result Date: 08/21/2021 CLINICAL DATA:  Hemorrhagic stroke. EXAM: CT HEAD WITHOUT CONTRAST TECHNIQUE: Contiguous axial images were obtained from the base of the skull through the vertex without intravenous contrast. RADIATION DOSE REDUCTION: This exam was performed according to the departmental dose-optimization program which includes automated exposure control, adjustment of the mA and/or kV according to patient size and/or use of iterative reconstruction technique. COMPARISON:  CT head without contrast and MR head without and with contrast 08/21/2021. FINDINGS: Brain: Focal hyperdensity in the left parietal lobe is stable, consistent with meningioma demonstrated on MRI scan. No other acute in scratched at no acute infarct or hemorrhage is present. No other focal mass lesion is present. White matter is within normal limits. Basal ganglia are normal. The  ventricles are of normal size. No significant extraaxial fluid collection is present. Insert normal brainstem Vascular: No hyperdense vessel or unexpected calcification. Skull: No significant extracranial soft tissue lesion is present. Calvarium is intact. No focal lytic or blastic lesions are present. Sinuses/Orbits: The paranasal sinuses and mastoid air cells are clear. The globes and orbits are within normal limits. IMPRESSION: 1. Stable left parietal meningioma. 2. No acute intracranial abnormality or significant interval change. Electronically Signed   By: San Morelle M.D.   On: 08/21/2021 21:16   MR BRAIN W WO CONTRAST  Result Date: 08/21/2021 CLINICAL DATA:  Hemorrhagic stroke EXAM: MRI HEAD WITHOUT AND WITH CONTRAST TECHNIQUE: Multiplanar, multiecho pulse sequences of the brain and surrounding structures were obtained without and with intravenous contrast. CONTRAST:  30m GADAVIST GADOBUTROL 1 MMOL/ML IV SOLN COMPARISON:  No prior MRI, correlation is made with CT head 08/21/2021 FINDINGS: Brain: Enhancing lesion in the superior left occipital lobe, which correlates with the hyperdense focus seen on the same-day CT head, which measures up to 9 x 10 x 17 mm (series 22, image 90 and series 24, image 13). This is favored to be extra-axial, with susceptibility associated with the lesion reflecting calcifications. No significant associated edema, mass effect, or midline shift. No restricted diffusion to suggest acute or subacute infarct. No hydrocephalus or extra-axial collection. Vascular: Normal arterial flow voids. Normal venous and arterial enhancement. Skull and upper cervical spine: Normal marrow signal. Sinuses/Orbits: No acute finding. Other: None. IMPRESSION: 1. Enhancing lesion along the superior left occipital lobe, which correlates with the hyperdense focus seen on the same-day  CT head, favored to be extra-axial most likely a meningioma. 2. No additional acute intracranial process. These  results will be called to the ordering clinician or representative by the Radiologist Assistant, and communication documented in the PACS or [employer]. Electronically Signed   By: Merilyn Baba M.D.   On: 08/21/2021 20:09   CT HEAD WO CONTRAST (5MM)  Result Date: 08/21/2021 CLINICAL DATA:  Stroke suspected, rule out hemorrhage EXAM: CT HEAD WITHOUT CONTRAST TECHNIQUE: Contiguous axial images were obtained from the base of the skull through the vertex without intravenous contrast. RADIATION DOSE REDUCTION: This exam was performed according to the departmental dose-optimization program which includes automated exposure control, adjustment of the mA and/or kV according to patient size and/or use of iterative reconstruction technique. COMPARISON:  None Available. FINDINGS: Brain: Small area of hyperdense hemorrhage in the superior left occipital lobe, which measures up to 9 x 8 x 13 mm. No significant surrounding edema or mass effect. No evidence of acute infarction, mass, or midline shift. No hydrocephalus. Vascular: No hyperdense vessel. Atherosclerotic calcifications in the intracranial carotid and vertebral arteries. Skull: Normal. Negative for fracture or focal lesion. Sinuses/Orbits: No acute finding. Other: The mastoid air cells are well aerated. IMPRESSION: Small area of likely acute hemorrhage in the superior left occipital lobe. No significant surrounding edema or associated mass effect These results will be called to the ordering clinician or representative by the Radiologist Assistant, and communication documented in the PACS or [employer]. Electronically Signed   By: Merilyn Baba M.D.   On: 08/21/2021 15:22   VAS [place] ABI WITH/WO TBI  Result Date: 08/20/2021  LOWER EXTREMITY DOPPLER STUDY Patient Name:  Beth Carroll  Date of Exam:   08/17/2021 Medical Rec #: [id number]      Accession #:    [id number] Date of Birth: [birth year]      Patient Gender: F Patient Age:   51 years [facility] Procedure:      VAS [place] ABI WITH/WO TBI Referring Phys: Eulogio Ditch --------------------------------------------------------------------------------  Indications: Claudication, rest pain, and peripheral artery disease. High Risk Factors: Hypertension, Diabetes, past history of smoking, coronary                    artery disease.  Vascular Interventions: 12/25/2020 right SFA DCPTA and stent. Performing Technologist: Delorise Shiner RVT  Examination Guidelines: A complete evaluation includes at minimum, Doppler waveform signals and systolic blood pressure reading at the level of bilateral brachial, anterior tibial, and posterior tibial arteries, when vessel segments are accessible. Bilateral testing is considered an integral part of a complete examination. Photoelectric Plethysmograph (PPG) waveforms and toe systolic pressure readings are included as required and additional duplex testing as needed. Limited examinations for reoccurring indications may be performed as noted.  ABI Findings: +---------+------------------+-----+-------------------+--------+ Right    Rt Pressure (mmHg)IndexWaveform           Comment  +---------+------------------+-----+-------------------+--------+ Brachial 197                                                +---------+------------------+-----+-------------------+--------+ ATA      51                0.26 dampened monophasic         +---------+------------------+-----+-------------------+--------+ PTA      50  0.25 dampened monophasic         +---------+------------------+-----+-------------------+--------+ Great Toe0                 0.00                             +---------+------------------+-----+-------------------+--------+ +---------+------------------+-----+----------+-------+ Left     Lt Pressure (mmHg)IndexWaveform  Comment +---------+------------------+-----+----------+-------+ Brachial 185                                       +---------+------------------+-----+----------+-------+ ATA      173               0.88 monophasic        +---------+------------------+-----+----------+-------+ PTA      187               0.95 biphasic          +---------+------------------+-----+----------+-------+ Great Toe119               0.60                   +---------+------------------+-----+----------+-------+ +-------+-----------+-----------+------------+------------+ ABI/TBIToday's ABIToday's TBIPrevious ABIPrevious TBI +-------+-----------+-----------+------------+------------+ Right  0.26       0.00       0.97        0.62         +-------+-----------+-----------+------------+------------+ Left   0.95       0.60       1.05        0.88         +-------+-----------+-----------+------------+------------+  Right ABIs appear decreased compared to prior study on 04/15/2021. Compared to prior study on 04/15/2021.  Summary: Right: Resting right ankle-brachial index indicates critical limb ischemia. The right toe-brachial index is abnormal. Left: Resting left ankle-brachial index is within normal range. No evidence of significant left lower extremity arterial disease. The left toe-brachial index is abnormal. *See table(s) above for measurements and observations.  Electronically signed by Leotis Pain MD on 08/20/2021 at 7:14:53 AM.    Final    VAS [place] LOWER EXTREMITY ARTERIAL DUPLEX  Result Date: 08/20/2021 LOWER EXTREMITY ARTERIAL DUPLEX STUDY Patient Name:  Beth Carroll  Date of Exam:   08/17/2021 Medical Rec #: [id number]      Accession #:    [id number] Date of Birth: October 19, 1970      Patient Gender: F Patient Age:   70 years Exam Location:  [facility] Procedure:      VAS [place] LOWER EXTREMITY ARTERIAL DUPLEX Referring Phys: Eulogio Ditch --------------------------------------------------------------------------------  Indications: Claudication, rest pain, and peripheral artery disease.  High Risk Factors: Hypertension, Diabetes, past history of smoking, coronary                    artery disease.  Vascular Interventions: 12/25/2020 right SFA DCPTA and stent. Current ABI:            Right: 0.26, Left: 0.95 Performing Technologist: Delorise Shiner RVT  Examination Guidelines: A complete evaluation includes B-mode imaging, spectral Doppler, color Doppler, and power Doppler as needed of all accessible portions of each vessel. Bilateral testing is considered an integral part of a complete examination. Limited examinations for reoccurring indications may be performed as noted.  +----------+--------+-----+--------+----------+---------------------+ RIGHT     PSV cm/sRatioStenosisWaveform  Comments              +----------+--------+-----+--------+----------+---------------------+  CFA Distal86                   triphasic                       +----------+--------+-----+--------+----------+---------------------+ DFA       173                  triphasic                       +----------+--------+-----+--------+----------+---------------------+ SFA Prox  0                                                    +----------+--------+-----+--------+----------+---------------------+ SFA Mid   0                                                    +----------+--------+-----+--------+----------+---------------------+ SFA Distal0                                                    +----------+--------+-----+--------+----------+---------------------+ POP Prox  0                                                    +----------+--------+-----+--------+----------+---------------------+ POP Distal35                   monophasicFills via collaterals +----------+--------+-----+--------+----------+---------------------+ ATA Distal21                   monophasic                      +----------+--------+-----+--------+----------+---------------------+ PTA Distal14                    monophasic                      +----------+--------+-----+--------+----------+---------------------+ A focal velocity elevation of 0 cm/s was obtained at SFA. Findings are characteristic of occluded.  Right Stent(s): +---------------+--------+--------+--------+--------+ SFA            PSV cm/sStenosisWaveformComments +---------------+--------+--------+--------+--------+ Prox to Stent  0       occluded                 +---------------+--------+--------+--------+--------+ Proximal Stent 0       occluded                 +---------------+--------+--------+--------+--------+ Mid Stent      0       occluded                 +---------------+--------+--------+--------+--------+ Distal Stent   0       occluded                 +---------------+--------+--------+--------+--------+ Distal to Stent0       occluded                 +---------------+--------+--------+--------+--------+  Summary: Right: Total occlusion noted in the superficial femoral artery. Right SFA stent is occluded. Distal popliteal artery fills via collaterals.  See table(s) above for measurements and observations. Electronically signed by Leotis Pain MD on 08/20/2021 at 7:14:45 AM.    Final    PERIPHERAL VASCULAR CATHETERIZATION  Result Date: 08/19/2021 See surgical note for result.  PERIPHERAL VASCULAR CATHETERIZATION  Result Date: 08/19/2021 See surgical note for result.  Anti-infectives: Anti-infectives (From admission, onward)    Start     Dose/Rate Route Frequency Ordered Stop   08/20/21 1330  sulfamethoxazole-trimethoprim (BACTRIM DS) 800-160 MG per tablet 1 tablet        1 tablet Oral Every 12 hours 08/20/21 1242 08/23/21 0959   08/19/21 0137  ceFAZolin (ANCEF) IVPB 2g/100 mL premix        2 g 200 mL/hr over 30 Minutes Intravenous 30 min pre-op 08/19/21 0137 08/19/21 1025       Assessment/Plan: s/p Procedure(s): Lower Extremity Angiography (Right) with percutaneous  revascularization. As noted yesterday, she was lethargic and imaging was interpreted initially as showing what was perhaps a small area of intracranial hemorrhage.  On repeat imaging this appears to be an incidental meningioma.  She was therefore restarted on her anticoagulation. We will continue increasing her activity and see how she does with PT today.  Likely discharge early next week.  She remains at high risk given her extensive revascularization.  Appreciate vigilance of our neurology colleagues.    LOS: 1 day   Bertram Savin 08/22/2021, 11:05 AM

## 2021-08-22 NOTE — Progress Notes (Addendum)
SLP Follow up Note  Patient Details Name: Beth Carroll MRN: [id number] DOB: 12/28/1970  Per charge nurse, pt has returned to baseline, Per nurse, no currents.   Rashard Ryle B. Rutherford Nail, M.S., CCC-SLP, Mining engineer Certified Brain Injury [place]  [place] Office [phone] Ascom [phone] Fax [phone]

## 2021-08-22 NOTE — Plan of Care (Signed)
  Problem: Education: Goal: Knowledge of disease or condition will improve Outcome: Progressing  Stroke Care Plan

## 2021-08-22 NOTE — Evaluation (Signed)
Physical Therapy Evaluation Patient Details Name: Beth Carroll MRN: [id number] DOB: 20-Feb-1970 Today's Date: 08/22/2021  History of Present Illness  Beth Carroll is a 51 y.o. female past medical history of coronary artery disease, diabetes, heart failure with reduced ejection fraction, hyperlipidemia, hypertension, peripheral arterial disease, tobacco abuse, ischemic cardiomyopathy, admitted for right lower extremity thrombectomy which was done on 08/19/2021.  The on-call neurosurgeon was called today because the floor care team felt that the patient was more drowsy.  Head CT was done that shows a small area of hyperdensity concerning for ICH in the left occipital lobe.  Neurological consultation was obtained for the [place]. Follow CT head scan R/O ICH.  Clinical Impression  Pt receivedin bed agreed to participate in PT evaluation. Pt SPO2 with O2 via [place] 2L remained 100% through out the session.  Pt has drop in BP with change of position 117/68 supine( elevated HOB) to 102/64 in sitting to 82/48 in standing with dizziness. Pt has increased pain in RLE with WB. So performed TTWB transfer. As per MD, pt allowed to be WBAT to RLE. Bed mobility with Mod assist, STS with Max assist with RW and transfer to Chair with max assist of 1.  Pt in chair comfortable without pain. Recommending SNF after acute care to return to PLOF as pt was I at community level driving car.      Recommendations for follow up therapy are one component of a multi-disciplinary discharge planning process, led by the attending physician.  Recommendations may be updated based on patient status, additional functional criteria and insurance authorization.  Follow Up Recommendations Skilled nursing-short term rehab (<3 hours/day) Can patient physically be transported by private vehicle: Yes    Assistance Recommended at Discharge Intermittent Supervision/Assistance  Patient can return home with the following  A lot of help with walking  and/or transfers;A lot of help with bathing/dressing/bathroom;Assistance with cooking/housework;Direct supervision/assist for medications management;Direct supervision/assist for financial management;Assist for transportation;Help with stairs or ramp for entrance    Equipment Recommendations Rolling walker (2 wheels);BSC/3in1  Recommendations for Other Services       Functional Status Assessment Patient has had a recent decline in their functional status and demonstrates the ability to make significant improvements in function in a reasonable and predictable amount of time.     Precautions / Restrictions Precautions Precautions: Fall Restrictions Weight Bearing Restrictions: No      Mobility  Bed Mobility Overal bed mobility: Needs Assistance Bed Mobility: Supine to Sit     Supine to sit: Mod assist          Transfers Overall transfer level: Needs assistance Equipment used: Rolling walker (2 wheels) Transfers: Sit to/from Stand, Bed to chair/wheelchair/BSC Sit to Stand: Max assist Stand pivot transfers: Max assist         General transfer comment: difficulty WB on RLE.    Ambulation/Gait Ambulation/Gait assistance:  (Unable due to pain and weakness and BP drop)                Stairs Unable            Wheelchair Mobility    Modified Rankin (Stroke Patients Only)       Balance Overall balance assessment: Needs assistance Sitting-balance support: Bilateral upper extremity supported, Feet supported Sitting balance-Leahy Scale: Fair     Standing balance support: Bilateral upper extremity supported Standing balance-Leahy Scale: Poor Standing balance comment: forward flexed posture.  Pertinent Vitals/Pain Pain Assessment Pain Assessment: 0-10 Pain Score: 6  (with WB) Pain Location: RLE, foot Pain Intervention(s):  (RN notified)    Home Living Family/patient expects to be discharged to:: Private  residence Living Arrangements: Spouse/significant other Available Help at Discharge: Available PRN/intermittently;Family Type of Home: Mobile home Home Access: Stairs to enter Entrance Stairs-Rails: Can reach both Entrance Stairs-Number of Steps: 1   Home Layout: One level Home Equipment: [place] (2 wheels);BSC/3in1 Additional Comments: not using any DME prior to admission. no recent fall balance issues.    Prior Function Prior Level of Function : Independent/Modified Independent;Working/employed;Driving             Mobility Comments: MOD I-I ADLs Comments: pt reports independent in ADL/IADL PTA; works in a [employer]   Dominant Hand: Right    Extremity/Trunk Assessment   Upper Extremity Assessment Upper Extremity Assessment: Defer to OT evaluation    Lower Extremity Assessment Lower Extremity Assessment: Generalized weakness (RLELLE weaker then L)       Communication   Communication: No difficulties  Cognition Arousal/Alertness: Awake/alert Behavior During Therapy: WFL for tasks assessed/performed Overall Cognitive Status: Within Functional Limits for tasks assessed                                          General Comments General comments (skin integrity, edema, etc.): edema, redness and numbness in RLE below knee    Exercises General Exercises - Lower Extremity Ankle Circles/Pumps: AROM, 10 reps, Seated Heel Raises: AROM, 5 reps, Seated   Assessment/Plan    PT Assessment Patient needs continued PT services  PT Problem List Decreased strength;Decreased range of motion;Decreased balance;Decreased activity tolerance;Decreased mobility;Pain;Decreased skin integrity       PT Treatment Interventions Gait training;Stair training;Functional mobility training;Therapeutic activities;Therapeutic exercise;Balance training;Neuromuscular re-education;Patient/family education    PT Goals (Current goals can be found  in the Care Plan section)  Acute Rehab PT Goals Patient Stated Goal: " I want to get stron and get back to independence, PT Goal Formulation: With patient Time For Goal Achievement: 09/05/21 Potential to Achieve Goals: Good    Frequency Min 2X/week     Co-evaluation               AM-PAC PT "6 Clicks" Mobility  Outcome Measure Help needed turning from your back to your side while in a flat bed without using bedrails?: A Little Help needed moving from lying on your back to sitting on the side of a flat bed without using bedrails?: A Lot Help needed moving to and from a bed to a chair (including a wheelchair)?: A Lot Help needed standing up from a chair using your arms (e.g., wheelchair or bedside chair)?: A Lot Help needed to walk in hospital room?: Total Help needed climbing 3-5 steps with a railing? : Total 6 Click Score: 11    End of Session Equipment Utilized During Treatment: Gait belt;Oxygen Activity Tolerance: Patient limited by pain;Patient limited by fatigue;Patient tolerated treatment well Patient left: in chair;with chair alarm set;with call bell/phone within reach Nurse Communication: Mobility status (BP status) PT Visit Diagnosis: Unsteadiness on feet (R26.81);Other abnormalities of gait and mobility (R26.89);Muscle weakness (generalized) (M62.81);Difficulty in walking, not elsewhere classified (R26.2);Dizziness and giddiness (R42);Pain (Adverse effexctson BP with change of position and symptomatic.) Pain - Right/Left: Right Pain - part of body: Leg  Time: 9499-7182 PT Time Calculation (min) (ACUTE ONLY): 30 min   Charges:   PT Evaluation $PT Eval Moderate Complexity: 1 Mod PT Treatments $Therapeutic Activity: 23-37 mins       Justinian Miano PT DPT 12:28 PM,08/22/21

## 2021-08-23 DIAGNOSIS — I70229 Atherosclerosis of native arteries of extremities with rest pain, unspecified extremity: Secondary | ICD-10-CM

## 2021-08-23 LAB — CBC
HCT: 33.6 % — ABNORMAL LOW (ref 36.0–46.0)
Hemoglobin: 10.9 g/dL — ABNORMAL LOW (ref 12.0–15.0)
MCH: 28.9 pg (ref 26.0–34.0)
MCHC: 32.4 g/dL (ref 30.0–36.0)
MCV: 89.1 fL (ref 80.0–100.0)
Platelets: 139 10*3/uL — ABNORMAL LOW (ref 150–400)
RBC: 3.77 MIL/uL — ABNORMAL LOW (ref 3.87–5.11)
RDW: 14 % (ref 11.5–15.5)
WBC: 5.3 10*3/uL (ref 4.0–10.5)
nRBC: 0 % (ref 0.0–0.2)

## 2021-08-23 LAB — BASIC METABOLIC PANEL
Anion gap: 5 (ref 5–15)
BUN: 17 mg/dL (ref 6–20)
CO2: 23 mmol/L (ref 22–32)
Calcium: 8.7 mg/dL — ABNORMAL LOW (ref 8.9–10.3)
Chloride: 110 mmol/L (ref 98–111)
Creatinine, Ser: 0.39 mg/dL — ABNORMAL LOW (ref 0.44–1.00)
GFR, Estimated: >60 mL/min (ref >60)
Glucose, Bld: 162 mg/dL — ABNORMAL HIGH (ref 70–99)
Potassium: 3.1 mmol/L — ABNORMAL LOW (ref 3.5–5.1)
Sodium: 138 mmol/L (ref 135–145)

## 2021-08-23 LAB — GLUCOSE, CAPILLARY
Glucose-Capillary: 146 mg/dL — ABNORMAL HIGH (ref 70–99)
Glucose-Capillary: 158 mg/dL — ABNORMAL HIGH (ref 70–99)
Glucose-Capillary: 113 mg/dL — ABNORMAL HIGH (ref 70–99)
Glucose-Capillary: 171 mg/dL — ABNORMAL HIGH (ref 70–99)

## 2021-08-23 MED ORDER — POTASSIUM CHLORIDE CRYS ER 20 MEQ PO TBCR
40.0000 meq | EXTENDED_RELEASE_TABLET | Freq: Every day | ORAL | Status: AC
  Administered 2021-08-23 – 2021-08-25 (×3): 40 meq via ORAL
  Filled 2021-08-23 (×3): qty 2

## 2021-08-23 NOTE — Progress Notes (Signed)
Subjective: Interval History: has no complaint of pain this morning.  She is alert and sitting up in the bed eating breakfast.  She reports pain with ambulation/physical therapy yesterday..   Objective: Vital signs in last 24 hours: Temp:  [97.7 F (36.5 C)-98.6 F (37 C)] 98.6 F (37 C) (07/30 0744) Pulse Rate:  [75-82] 75 (07/30 0744) Resp:  [16-20] 16 (07/30 0744) BP: (91-130)/(60-75) 123/73 (07/30 0744) SpO2:  [97 %-100 %] 99 % (07/30 0744)  Intake/Output from previous day: 07/29 0701 - 07/30 0700 In: 720 [P.O.:720] Out: 1700 [Urine:1700] Intake/Output this shift: No intake/output data recorded.  General appearance: alert and no distress Extremities: Both legs warm and well-perfused.  She has protective dressings on the heels.   Lab Results: Recent Labs    08/22/21 0811 08/23/21 0604  WBC 8.4 5.3  HGB 11.0* 10.9*  HCT 34.0* 33.6*  PLT 147* 139*   BMET Recent Labs    08/22/21 0628 08/23/21 0604  NA 141 138  K 3.1* 3.1*  CL 114* 110  CO2 21* 23  GLUCOSE 96 162*  BUN 21* 17  CREATININE 0.61 0.39*  CALCIUM 9.0 8.7*    Studies/Results: CT HEAD WO CONTRAST (5MM)  Result Date: 08/21/2021 CLINICAL DATA:  Hemorrhagic stroke. EXAM: CT HEAD WITHOUT CONTRAST TECHNIQUE: Contiguous axial images were obtained from the base of the skull through the vertex without intravenous contrast. RADIATION DOSE REDUCTION: This exam was performed according to the departmental dose-optimization program which includes automated exposure control, adjustment of the mA and/or kV according to patient size and/or use of iterative reconstruction technique. COMPARISON:  CT head without contrast and MR head without and with contrast 08/21/2021. FINDINGS: Brain: Focal hyperdensity in the left parietal lobe is stable, consistent with meningioma demonstrated on MRI scan. No other acute in scratched at no acute infarct or hemorrhage is present. No other focal mass lesion is present. White matter is  within normal limits. Basal ganglia are normal. The ventricles are of normal size. No significant extraaxial fluid collection is present. Insert normal brainstem Vascular: No hyperdense vessel or unexpected calcification. Skull: No significant extracranial soft tissue lesion is present. Calvarium is intact. No focal lytic or blastic lesions are present. Sinuses/Orbits: The paranasal sinuses and mastoid air cells are clear. The globes and orbits are within normal limits. IMPRESSION: 1. Stable left parietal meningioma. 2. No acute intracranial abnormality or significant interval change. Electronically Signed   By: San Morelle M.D.   On: 08/21/2021 21:16   MR BRAIN W WO CONTRAST  Result Date: 08/21/2021 CLINICAL DATA:  Hemorrhagic stroke EXAM: MRI HEAD WITHOUT AND WITH CONTRAST TECHNIQUE: Multiplanar, multiecho pulse sequences of the brain and surrounding structures were obtained without and with intravenous contrast. CONTRAST:  90m GADAVIST GADOBUTROL 1 MMOL/ML IV SOLN COMPARISON:  No prior MRI, correlation is made with CT head 08/21/2021 FINDINGS: Brain: Enhancing lesion in the superior left occipital lobe, which correlates with the hyperdense focus seen on the same-day CT head, which measures up to 9 x 10 x 17 mm (series 22, image 90 and series 24, image 13). This is favored to be extra-axial, with susceptibility associated with the lesion reflecting calcifications. No significant associated edema, mass effect, or midline shift. No restricted diffusion to suggest acute or subacute infarct. No hydrocephalus or extra-axial collection. Vascular: Normal arterial flow voids. Normal venous and arterial enhancement. Skull and upper cervical spine: Normal marrow signal. Sinuses/Orbits: No acute finding. Other: None. IMPRESSION: 1. Enhancing lesion along the superior left occipital lobe,  which correlates with the hyperdense focus seen on the same-day CT head, favored to be extra-axial most likely a meningioma.  2. No additional acute intracranial process. These results will be called to the ordering clinician or representative by the Radiologist Assistant, and communication documented in the PACS or [employer]. Electronically Signed   By: Merilyn Baba M.D.   On: 08/21/2021 20:09   CT HEAD WO CONTRAST (5MM)  Result Date: 08/21/2021 CLINICAL DATA:  Stroke suspected, rule out hemorrhage EXAM: CT HEAD WITHOUT CONTRAST TECHNIQUE: Contiguous axial images were obtained from the base of the skull through the vertex without intravenous contrast. RADIATION DOSE REDUCTION: This exam was performed according to the departmental dose-optimization program which includes automated exposure control, adjustment of the mA and/or kV according to patient size and/or use of iterative reconstruction technique. COMPARISON:  None Available. FINDINGS: Brain: Small area of hyperdense hemorrhage in the superior left occipital lobe, which measures up to 9 x 8 x 13 mm. No significant surrounding edema or mass effect. No evidence of acute infarction, mass, or midline shift. No hydrocephalus. Vascular: No hyperdense vessel. Atherosclerotic calcifications in the intracranial carotid and vertebral arteries. Skull: Normal. Negative for fracture or focal lesion. Sinuses/Orbits: No acute finding. Other: The mastoid air cells are well aerated. IMPRESSION: Small area of likely acute hemorrhage in the superior left occipital lobe. No significant surrounding edema or associated mass effect These results will be called to the ordering clinician or representative by the Radiologist Assistant, and communication documented in the PACS or [employer]. Electronically Signed   By: Merilyn Baba M.D.   On: 08/21/2021 15:22   VAS [place] ABI WITH/WO TBI  Result Date: 08/20/2021  LOWER EXTREMITY DOPPLER STUDY Patient Name:  Beth Carroll  Date of Exam:   08/17/2021 Medical Rec #: [id number]      Accession #:    [id number] Date of Birth: 1970/06/21      Patient  Gender: F Patient Age:   51 years Exam Location:  [facility] Procedure:      VAS [place] ABI WITH/WO TBI Referring Phys: Eulogio Ditch --------------------------------------------------------------------------------  Indications: Claudication, rest pain, and peripheral artery disease. High Risk Factors: Hypertension, Diabetes, past history of smoking, coronary                    artery disease.  Vascular Interventions: 12/25/2020 right SFA DCPTA and stent. Performing Technologist: Delorise Shiner RVT  Examination Guidelines: A complete evaluation includes at minimum, Doppler waveform signals and systolic blood pressure reading at the level of bilateral brachial, anterior tibial, and posterior tibial arteries, when vessel segments are accessible. Bilateral testing is considered an integral part of a complete examination. Photoelectric Plethysmograph (PPG) waveforms and toe systolic pressure readings are included as required and additional duplex testing as needed. Limited examinations for reoccurring indications may be performed as noted.  ABI Findings: +---------+------------------+-----+-------------------+--------+ Right    Rt Pressure (mmHg)IndexWaveform           Comment  +---------+------------------+-----+-------------------+--------+ Brachial 197                                                +---------+------------------+-----+-------------------+--------+ ATA      51                0.26 dampened monophasic         +---------+------------------+-----+-------------------+--------+ PTA  50                0.25 dampened monophasic         +---------+------------------+-----+-------------------+--------+ Great Toe0                 0.00                             +---------+------------------+-----+-------------------+--------+ +---------+------------------+-----+----------+-------+ Left     Lt Pressure (mmHg)IndexWaveform  Comment  +---------+------------------+-----+----------+-------+ Brachial 185                                      +---------+------------------+-----+----------+-------+ ATA      173               0.88 monophasic        +---------+------------------+-----+----------+-------+ PTA      187               0.95 biphasic          +---------+------------------+-----+----------+-------+ Great Toe119               0.60                   +---------+------------------+-----+----------+-------+ +-------+-----------+-----------+------------+------------+ ABI/TBIToday's ABIToday's TBIPrevious ABIPrevious TBI +-------+-----------+-----------+------------+------------+ Right  0.26       0.00       0.97        0.62         +-------+-----------+-----------+------------+------------+ Left   0.95       0.60       1.05        0.88         +-------+-----------+-----------+------------+------------+  Right ABIs appear decreased compared to prior study on 04/15/2021. Compared to prior study on 04/15/2021.  Summary: Right: Resting right ankle-brachial index indicates critical limb ischemia. The right toe-brachial index is abnormal. Left: Resting left ankle-brachial index is within normal range. No evidence of significant left lower extremity arterial disease. The left toe-brachial index is abnormal. *See table(s) above for measurements and observations.  Electronically signed by Leotis Pain MD on 08/20/2021 at 7:14:53 AM.    Final    VAS [place] LOWER EXTREMITY ARTERIAL DUPLEX  Result Date: 08/20/2021 LOWER EXTREMITY ARTERIAL DUPLEX STUDY Patient Name:  Beth Carroll  Date of Exam:   08/17/2021 Medical Rec #: [id number]      Accession #:    [id number] Date of Birth: [birth year]      Patient Gender: F Patient Age:   53 years Exam Location:  [facility] Procedure:      VAS [place] LOWER EXTREMITY ARTERIAL DUPLEX Referring Phys: Eulogio Ditch  --------------------------------------------------------------------------------  Indications: Claudication, rest pain, and peripheral artery disease. High Risk Factors: Hypertension, Diabetes, past history of smoking, coronary                    artery disease.  Vascular Interventions: 12/25/2020 right SFA DCPTA and stent. Current ABI:            Right: 0.26, Left: 0.95 Performing Technologist: Delorise Shiner RVT  Examination Guidelines: A complete evaluation includes B-mode imaging, spectral Doppler, color Doppler, and power Doppler as needed of all accessible portions of each vessel. Bilateral testing is considered an integral part of a complete examination. Limited examinations for reoccurring indications may be performed as noted.  +----------+--------+-----+--------+----------+---------------------+ RIGHT     PSV cm/sRatioStenosisWaveform  Comments              +----------+--------+-----+--------+----------+---------------------+ CFA Distal86                   triphasic                       +----------+--------+-----+--------+----------+---------------------+ DFA       173                  triphasic                       +----------+--------+-----+--------+----------+---------------------+ SFA Prox  0                                                    +----------+--------+-----+--------+----------+---------------------+ SFA Mid   0                                                    +----------+--------+-----+--------+----------+---------------------+ SFA Distal0                                                    +----------+--------+-----+--------+----------+---------------------+ POP Prox  0                                                    +----------+--------+-----+--------+----------+---------------------+ POP Distal35                   monophasicFills via collaterals +----------+--------+-----+--------+----------+---------------------+ ATA  Distal21                   monophasic                      +----------+--------+-----+--------+----------+---------------------+ PTA Distal14                   monophasic                      +----------+--------+-----+--------+----------+---------------------+ A focal velocity elevation of 0 cm/s was obtained at SFA. Findings are characteristic of occluded.  Right Stent(s): +---------------+--------+--------+--------+--------+ SFA            PSV cm/sStenosisWaveformComments +---------------+--------+--------+--------+--------+ Prox to Stent  0       occluded                 +---------------+--------+--------+--------+--------+ Proximal Stent 0       occluded                 +---------------+--------+--------+--------+--------+ Mid Stent      0       occluded                 +---------------+--------+--------+--------+--------+ Distal Stent   0       occluded                 +---------------+--------+--------+--------+--------+ Distal to [facility]  occluded                 +---------------+--------+--------+--------+--------+    Summary: Right: Total occlusion noted in the superficial femoral artery. Right SFA stent is occluded. Distal popliteal artery fills via collaterals.  See table(s) above for measurements and observations. Electronically signed by Leotis Pain MD on 08/20/2021 at 7:14:45 AM.    Final    PERIPHERAL VASCULAR CATHETERIZATION  Result Date: 08/19/2021 See surgical note for result.  PERIPHERAL VASCULAR CATHETERIZATION  Result Date: 08/19/2021 See surgical note for result.  Anti-infectives: Anti-infectives (From admission, onward)    Start     Dose/Rate Route Frequency Ordered Stop   08/20/21 1330  sulfamethoxazole-trimethoprim (BACTRIM DS) 800-160 MG per tablet 1 tablet        1 tablet Oral Every 12 hours 08/20/21 1242 08/22/21 2259   08/19/21 0137  ceFAZolin (ANCEF) IVPB 2g/100 mL premix        2 g 200 mL/hr over 30 Minutes Intravenous  30 min pre-op 08/19/21 0137 08/19/21 1025       Assessment/Plan: s/p Procedure(s): Lower Extremity Angiography (Right) With percutaneous revascularization.   Continue full anticoagulation and therapies.  She is significantly debilitated on evaluation yesterday and may require more intensive rehabilitation.   LOS: 2 days   Bertram Savin 08/23/2021, 9:14 AM

## 2021-08-23 NOTE — NC FL2 (Signed)
[place] LEVEL OF CARE SCREENING TOOL     IDENTIFICATION  Patient Name: Beth Carroll Birthdate: [birth year] Sex: female Admission Date (Current Location): 08/19/2021  [facility] [place] Number:  Engineering geologist and Address:  [facility], [address] [place], [place] [address]      Provider Number: [id number]  Attending Physician Name and Address:  Algernon Huxley, MD  Relative Name and Phone Number:  Roque Cash [address]   [place] [address] [phone]    Current Level of Care: Hospital Recommended Level of Care: [place] Prior Approval Number:    Date Approved/Denied:   PASRR Number: [id number]  Discharge Plan: SNF    Current Diagnoses: Patient Active Problem List   Diagnosis Date Noted   Atherosclerosis of artery of extremity with rest pain [facility])    Ischemic leg 08/19/2021   Ischemic cardiomyopathy    Acute systolic heart failure (HCC)    Diabetes mellitus without complication (HCC)    Hypertension    Rest pain of both lower extremities due to atherosclerosis (HCC)    Postural dizziness with presyncope    Hyperglycemia due to type 2 diabetes mellitus (HCC)    Hypokalemia    Bilateral lower extremity edema    Tobacco abuse 10/23/2020    Orientation RESPIRATION BLADDER Height & Weight     Self, Time, Situation, Place  Normal External catheter Weight: 52.2 kg Height:  '5\' 5"'$  (165.1 cm)  BEHAVIORAL SYMPTOMS/MOOD NEUROLOGICAL BOWEL NUTRITION STATUS        Diet  AMBULATORY STATUS COMMUNICATION OF NEEDS Skin   Limited Assist Verbally Normal                       Personal Care Assistance Level of Assistance  Bathing, Dressing Bathing Assistance: Limited assistance   Dressing Assistance: Limited assistance     Functional Limitations Info  Sight, Hearing, Speech Sight Info: Impaired (wears glasses) Hearing Info: Adequate Speech Info: Adequate    SPECIAL CARE FACTORS  FREQUENCY  PT (By licensed PT), OT (By licensed OT)     PT Frequency: 5x week OT Frequency: 5x week            Contractures Contractures Info: Not present    Additional Factors Info  Code Status, Allergies Code Status Info: Full Allergies Info: None           Current Medications (08/23/2021):  This is the current hospital active medication list Current Facility-Administered Medications  Medication Dose Route Frequency Provider Last Rate Last Admin   0.9 %  sodium chloride infusion   Intravenous Continuous Algernon Huxley, MD 75 mL/hr at 08/21/21 0840 New Bag at 08/21/21 0840   acetaminophen (TYLENOL) tablet 325-650 mg  325-650 mg Oral Q4H PRN Algernon Huxley, MD   650 mg at 08/23/21 1103   Or   acetaminophen (TYLENOL) suppository 325-650 mg  325-650 mg Rectal Q4H PRN Algernon Huxley, MD       albuterol (PROVENTIL) (2.5 MG/3ML) 0.083% nebulizer solution 3 mL  3 mL Nebulization Q6H PRN Algernon Huxley, MD       alum & mag hydroxide-simeth (MAALOX/MYLANTA) 200-200-20 MG/5ML suspension 15-30 mL  15-30 mL Oral Q2H PRN Algernon Huxley, MD       apixaban Arne Cleveland) tablet 5 mg  5 mg Oral BID Bertram Savin, MD   5 mg at 08/23/21 0801   aspirin EC tablet 81 mg  81  mg Oral Daily Algernon Huxley, MD   81 mg at 08/23/21 0802   atorvastatin (LIPITOR) tablet 20 mg  20 mg Oral Daily Algernon Huxley, MD   20 mg at 08/23/21 0803   carvedilol (COREG) tablet 3.125 mg  3.125 mg Oral BID WC Algernon Huxley, MD   3.125 mg at 08/23/21 0801   furosemide (LASIX) tablet 20 mg  20 mg Oral Daily Algernon Huxley, MD   20 mg at 08/23/21 0801   guaiFENesin-dextromethorphan (ROBITUSSIN DM) 100-10 MG/5ML syrup 15 mL  15 mL Oral Q4H PRN Algernon Huxley, MD       hydrALAZINE (APRESOLINE) injection 5 mg  5 mg Intravenous Q20 Min PRN Algernon Huxley, MD       insulin aspart (novoLOG) injection 0-15 Units  0-15 Units Subcutaneous TID WC Kris Hartmann, NP   3 Units at 08/23/21 1159   insulin aspart (novoLOG) injection 3 Units  3 Units Subcutaneous  TID WC Kris Hartmann, NP   3 Units at 08/23/21 1159   insulin glargine-yfgn (SEMGLEE) injection 15 Units  15 Units Subcutaneous QHS Eulogio Ditch E, NP   15 Units at 08/22/21 2257   labetalol (NORMODYNE) injection 10 mg  10 mg Intravenous Q10 min PRN Algernon Huxley, MD       losartan (COZAAR) tablet 12.5 mg  12.5 mg Oral Daily Algernon Huxley, MD   12.5 mg at 08/23/21 0801   metoprolol tartrate (LOPRESSOR) injection 2-5 mg  2-5 mg Intravenous Q2H PRN Algernon Huxley, MD       ondansetron [facility]) injection 4 mg  4 mg Intravenous Q6H PRN Algernon Huxley, MD   4 mg at 08/19/21 2007   ondansetron (ZOFRAN) injection 4 mg  4 mg Intravenous Q6H PRN Algernon Huxley, MD   4 mg at 08/20/21 0900   pantoprazole (PROTONIX) EC tablet 40 mg  40 mg Oral Daily Algernon Huxley, MD   40 mg at 08/23/21 0801   phenol (CHLORASEPTIC) mouth spray 1 spray  1 spray Mouth/Throat PRN Algernon Huxley, MD       potassium chloride SA (KLOR-CON M) CR tablet 40 mEq  40 mEq Oral Daily Bertram Savin, MD   40 mEq at 08/23/21 0802   senna-docusate (Senokot-S) tablet 1 tablet  1 tablet Oral QHS PRN Algernon Huxley, MD   1 tablet at 08/21/21 2227   senna-docusate (Senokot-S) tablet 1 tablet  1 tablet Oral BID Amie Portland, MD   1 tablet at 08/23/21 0801   sorbitol 70 % solution 30 mL  30 mL Oral Daily PRN Algernon Huxley, MD       tiotropium [facility]) inhalation capsule ([facility] use ONLY) 18 mcg  18 mcg Inhalation Daily Kris Hartmann, NP   18 mcg at 08/23/21 2440   traMADol (ULTRAM) tablet 50 mg  50 mg Oral Q6H PRN Schnier, Dolores Lory, MD   50 mg at 08/23/21 1103   [START ON 08/24/2021] Vitamin D (Ergocalciferol) (DRISDOL) 1.25 MG (50000 UNIT) capsule 50,000 Units  50,000 Units Oral Weekly Dew, Erskine Squibb, MD         Discharge Medications: Please see discharge summary for a list of discharge medications.  Relevant Imaging Results:  Relevant Lab Results:   Additional Information SS# [id number]  Kerin Salen, RN

## 2021-08-23 NOTE — TOC Initial Note (Signed)
Transition of Care [facility]) - Initial/Assessment Note    Patient Details  Name: Beth Carroll MRN: [id number] Date of Birth: 1970/06/01  Transition of Care [facility]) CM/SW Contact:    Kerin Salen, RN Phone Number: 08/23/2021, 1:19 PM  Clinical Narrative:  CM assess patient at bedside  Transition of Care [facility]) Screening Note   Patient Details  Name: Beth Carroll Date of Birth: 05/15/1970   Transition of Care [facility]) CM/SW Contact:    Kerin Salen, RN Phone Number: 08/23/2021, 1:22 PM    Transition of Care Department [facility]) has reviewed patient and no TOC needs have been identified at this time. We will continue to monitor patient advancement through interdisciplinary progression rounds. If new patient transition needs arise, please place a TOC consult.   Discussed SNF recommendation/preference patient consents to SNF, prefers one in [place] and or the [place] area. Patient understands search results will be discussed for final decision.                   Patient Goals and CMS Choice        Expected Discharge Plan and Services                                                Prior Living Arrangements/Services                       Activities of Daily Living Home Assistive Devices/Equipment: Blood pressure cuff, CBG Meter ADL Screening (condition at time of admission) Patient's cognitive ability adequate to safely complete daily activities?: Yes Is the patient deaf or have difficulty hearing?: No Does the patient have difficulty seeing, even when wearing glasses/contacts?: No Does the patient have difficulty concentrating, remembering, or making decisions?: No Patient able to express need for assistance with ADLs?: Yes Does the patient have difficulty dressing or bathing?: No Independently performs ADLs?: Yes (appropriate for developmental age) Does the patient have difficulty walking or climbing stairs?: No Weakness of Legs: None Weakness of  Arms/Hands: None  Permission Sought/Granted                  Emotional Assessment              Admission diagnosis:  Ischemic leg [I99.8] Patient Active Problem List   Diagnosis Date Noted   Atherosclerosis of artery of extremity with rest pain ([place])    Ischemic leg 08/19/2021   Ischemic cardiomyopathy    Acute systolic heart failure ([place])    Diabetes mellitus without complication ([place])    Hypertension    Rest pain of both lower extremities due to atherosclerosis ([place])    Postural dizziness with presyncope    Hyperglycemia due to type 2 diabetes mellitus ([place])    Hypokalemia    Bilateral lower extremity edema    Tobacco abuse 10/23/2020   PCP:  Baxter Hire, MD Pharmacy:   [id number] [place], [place] - [address] [place] [address]Phone: [phone]: [phone]    Social Determinants of Health (SDOH) Interventions    Readmission Risk Interventions     No data to display

## 2021-08-24 DIAGNOSIS — Z95828 Presence of other vascular implants and grafts: Secondary | ICD-10-CM | POA: Diagnosis not present

## 2021-08-24 DIAGNOSIS — Z9889 Other specified postprocedural states: Secondary | ICD-10-CM

## 2021-08-24 DIAGNOSIS — I70221 Atherosclerosis of native arteries of extremities with rest pain, right leg: Secondary | ICD-10-CM | POA: Diagnosis not present

## 2021-08-24 LAB — GLUCOSE, CAPILLARY
Glucose-Capillary: 190 mg/dL — ABNORMAL HIGH (ref 70–99)
Glucose-Capillary: 206 mg/dL — ABNORMAL HIGH (ref 70–99)
Glucose-Capillary: 87 mg/dL (ref 70–99)
Glucose-Capillary: 227 mg/dL — ABNORMAL HIGH (ref 70–99)

## 2021-08-24 NOTE — Plan of Care (Signed)
  Problem: Education: Goal: Knowledge of General Education information will improve Description Including pain rating scale, medication(s)/side effects and non-pharmacologic comfort measures Outcome: Progressing   Problem: Health Behavior/Discharge Planning: Goal: Ability to manage health-related needs will improve Outcome: Progressing   

## 2021-08-24 NOTE — Progress Notes (Signed)
Physical Therapy Treatment Patient Details Name: Beth Carroll MRN: [id number] DOB: [birth year] Today's Date: 08/24/2021   History of Present Illness Beth Carroll is a 51 y.o. female past medical history of coronary artery disease, diabetes, heart failure with reduced ejection fraction, hyperlipidemia, hypertension, peripheral arterial disease, tobacco abuse, ischemic cardiomyopathy, admitted for right lower extremity thrombectomy which was done on 08/19/2021.  The on-call neurosurgeon was called today because the floor care team felt that the patient was more drowsy.  Head CT was done that shows a small area of hyperdensity concerning for ICH in the left occipital lobe.  Neurological consultation was obtained for the [place]. Follow CT head scan R/O ICH.    PT Comments     Pt received in bed without using O2. Motivated to participate in therapy. Pt demonstrated increased edema in BLE below knees. Pt has air compression on. Pt participated in AROM exs to BLE to improve sensation, endurance and strength. Pt educated to perform the same exs every 2 hours. Pt performed STS and Bed->Chair transfer with Min assist of 1, Ambulated with RW ~7 ft x 2 with PWB to RLE 2/2 pt is apprehensive of pain with WB on RLE. Pt verbalize her desire to return home with her significant other but is aware that she has 3 steps to enter the home and today she is inappropriate to receive stair training. Pt is progressing well towards her goals.  Pt's discharge destination depends on her ability to ascend and descend step.   Recommendations for follow up therapy are one component of a multi-disciplinary discharge planning process, led by the attending physician.  Recommendations may be updated based on patient status, additional functional criteria and insurance authorization.  Follow Up Recommendations  Other (comment) (If pt able to ascend and descend 3 steps pt will benefit form HHPT.) Can patient physically be transported by  private vehicle: Yes   Assistance Recommended at Discharge Intermittent Supervision/Assistance  Patient can return home with the following A little help with walking and/or transfers;A little help with bathing/dressing/bathroom;Assistance with cooking/housework;Assistance with feeding;Assist for transportation;Help with stairs or ramp for entrance   Equipment Recommendations  Rolling walker (2 wheels);BSC/3in1    Recommendations for Other Services       Precautions / Restrictions Precautions Precautions: Fall Restrictions Weight Bearing Restrictions: No     Mobility  Bed Mobility Overal bed mobility: Needs Assistance Bed Mobility: Supine to Sit     Supine to sit: Min guard          Transfers Overall transfer level: Needs assistance Equipment used: Rolling walker (2 wheels) Transfers: Sit to/from Stand, Bed to chair/wheelchair/BSC Sit to Stand: Min assist Stand pivot transfers: Min assist Step pivot transfers: Min assist       General transfer comment: difficulty WB on RLE without increase in pain but increased tingling in RLE.    Ambulation/Gait Ambulation/Gait assistance: Min assist Gait Distance (Feet): 7 Feet (x2) Assistive device: Rolling walker (2 wheels) Gait Pattern/deviations: Step-to pattern (secreased loading response to RLE.) Gait velocity: decreased         Stairs Stairs:  (Not appropriate today.)           Wheelchair Mobility    Modified Rankin (Stroke Patients Only)       Balance Overall balance assessment: Modified Independent Sitting-balance support: No upper extremity supported, Bilateral upper extremity supported Sitting balance-Leahy Scale: Good     Standing balance support: Bilateral upper extremity supported Standing balance-Leahy Scale: Fair Standing balance comment:  (posture improved.)  Cognition Arousal/Alertness: Awake/alert Behavior During Therapy: WFL for tasks  assessed/performed Overall Cognitive Status: Within Functional Limits for tasks assessed                                          Exercises General Exercises - Lower Extremity Ankle Circles/Pumps: AROM, 10 reps, Seated Long Arc Quad: AROM, 20 reps, Seated Hip Flexion/Marching: AROM, Seated, 20 reps    General Comments General comments (skin integrity, edema, etc.): decreased      Pertinent Vitals/Pain Pain Assessment Pain Assessment: 0-10 Pain Score: 2  Pain Location: RLE, foot Pain Descriptors / Indicators: Aching Pain Intervention(s): RN gave pain meds during session    Home Living                          Prior Function            PT Goals (current goals can now be found in the care plan section) Acute Rehab PT Goals Patient Stated Goal: " I want to get stron and get back to independence, (" want to go home.") PT Goal Formulation: With patient Time For Goal Achievement: 09/05/21 Potential to Achieve Goals: Good Progress towards PT goals: Progressing toward goals    Frequency    Min 2X/week      PT Plan Current plan remains appropriate    Co-evaluation              AM-PAC PT "6 Clicks" Mobility   Outcome Measure  Help needed turning from your back to your side while in a flat bed without using bedrails?: None Help needed moving from lying on your back to sitting on the side of a flat bed without using bedrails?: A Little Help needed moving to and from a bed to a chair (including a wheelchair)?: A Little Help needed standing up from a chair using your arms (e.g., wheelchair or bedside chair)?: A Little Help needed to walk in hospital room?: A Little Help needed climbing 3-5 steps with a railing? : Total 6 Click Score: 17    End of Session Equipment Utilized During Treatment: Gait belt Activity Tolerance: Patient tolerated treatment well (limited due to afarid of pain with WB.) Patient left: in chair;with call bell/phone  within reach;with family/visitor present Nurse Communication: Mobility status PT Visit Diagnosis: Unsteadiness on feet (R26.81);Other abnormalities of gait and mobility (R26.89);Muscle weakness (generalized) (M62.81);Difficulty in walking, not elsewhere classified (R26.2);Dizziness and giddiness (R42);Pain Pain - Right/Left: Right Pain - part of body: Leg     Time: 1322-1400 PT Time Calculation (min) (ACUTE ONLY): 38 min  Charges:  $Gait Training: 8-22 mins $Therapeutic Exercise: 8-22 mins $Therapeutic Activity: 8-22 mins                     Chasya Zenz PT DPT 3:38 PM,08/24/21  08/24/2021, 3:33 PM

## 2021-08-24 NOTE — Progress Notes (Signed)
2045-DrDelana Meyer page to clarify IVF order and make aware still has tea colored urine. Patient does not currently have IVF running even though has NS order. Unsure when IVFs were stopped. Question to restart fluids or discontinue IV? Also, made aware that urine is tea colored and patient is on eliquis, ? Blood. Order received to discontinue IVF order, no new order received in regards to tea color urine.

## 2021-08-24 NOTE — TOC Progression Note (Addendum)
Transition of Care [facility]) - Progression Note    Patient Details  Name: Beth Carroll MRN: [id number] Date of Birth: December 03, 1970  Transition of Care [facility]) CM/SW [place], LCSW Phone Number: 08/24/2021, 9:01 AM  Clinical Narrative:  No bed offers this morning. Sent therapy notes to pending SNF's to try and trigger responses.   1:06 pm: Patient has a bed offer from [facility]. Patient is aware. She is hopeful to be strong enough to go home at discharge rather than SNF. Will go ahead and accept bed offer in case home is not a safe option yet. Did tell her that it is often difficult to find home health for commercial insurance plans. She is agreeable to outpatient therapy downstairs if needed. She went there late last year.  Expected Discharge Plan and Services                                                 Social Determinants of Health (SDOH) Interventions    Readmission Risk Interventions     No data to display

## 2021-08-24 NOTE — Progress Notes (Signed)
[facility]  Daily Progress Note   Subjective  -   More alert and awake.  Feeling better.  Worked well with PT today  Objective Vitals:   08/24/21 0004 08/24/21 0339 08/24/21 1201 08/24/21 1615  BP: 125/77 139/78 118/75 136/73  Pulse: 81 79 77 75  Resp: '17 18 16 17  '$ Temp: 98.5 F (36.9 C) 98 F (36.7 C) 98.4 F (36.9 C) 97.8 F (36.6 C)  TempSrc:      SpO2: 99% 100% 100% 100%  Weight:      Height:        Intake/Output Summary (Last 24 hours) at 08/24/2021 1640 Last data filed at 08/24/2021 1426 Gross per 24 hour  Intake 480 ml  Output 700 ml  Net -220 ml    PULM  CTAB CV  RRR VASC  1-2+ RLE edema, trace LLE edema. Warm with 1+ pulses  Laboratory CBC    Component Value Date/Time   WBC 5.3 08/23/2021 0604   HGB 10.9 (L) 08/23/2021 0604   HCT 33.6 (L) 08/23/2021 0604   PLT 139 (L) 08/23/2021 0604    BMET    Component Value Date/Time   NA 138 08/23/2021 0604   NA 137 03/18/2021 1541   K 3.1 (L) 08/23/2021 0604   CL 110 08/23/2021 0604   CO2 23 08/23/2021 0604   GLUCOSE 162 (H) 08/23/2021 0604   BUN 17 08/23/2021 0604   BUN 15 03/18/2021 1541   CREATININE 0.39 (L) 08/23/2021 0604   CALCIUM 8.7 (L) 08/23/2021 0604   GFRNONAA >60 08/23/2021 0604    Assessment/Planning: POD #5 s/p revascularization for ischemic right leg  Doing better Perfusion intact Swelling to be expected Still weak and deconditioned Continue PT/OT May be able to go home with home PT in next couple of days   Leotis Pain  08/24/2021, 4:40 PM

## 2021-08-25 DIAGNOSIS — Z95828 Presence of other vascular implants and grafts: Secondary | ICD-10-CM | POA: Diagnosis not present

## 2021-08-25 DIAGNOSIS — I70221 Atherosclerosis of native arteries of extremities with rest pain, right leg: Secondary | ICD-10-CM | POA: Diagnosis not present

## 2021-08-25 DIAGNOSIS — Z9889 Other specified postprocedural states: Secondary | ICD-10-CM | POA: Diagnosis not present

## 2021-08-25 LAB — GLUCOSE, CAPILLARY
Glucose-Capillary: 250 mg/dL — ABNORMAL HIGH (ref 70–99)
Glucose-Capillary: 225 mg/dL — ABNORMAL HIGH (ref 70–99)
Glucose-Capillary: 253 mg/dL — ABNORMAL HIGH (ref 70–99)

## 2021-08-25 NOTE — Progress Notes (Signed)
[facility]  Daily Progress Note   Subjective  -   Patient doing well this morning.  No new complaints other than dark-colored urine.  No major overnight events  Objective Vitals:   08/24/21 1926 08/24/21 2309 08/25/21 0431 08/25/21 0741  BP: (!) 151/82 124/70 129/73 125/73  Pulse: 86 80 79 84  Resp: '19 20 20 18  '$ Temp: 98.2 F (36.8 C) 98.1 F (36.7 C) 97.8 F (36.6 C)   TempSrc: Oral Oral Oral   SpO2: 100% 98% 100% 98%  Weight:      Height:        Intake/Output Summary (Last 24 hours) at 08/25/2021 0959 Last data filed at 08/25/2021 1497 Gross per 24 hour  Intake 360 ml  Output 800 ml  Net -440 ml    PULM  CTAB CV  RRR VASC  feet are warm with good capillary refill and 1+ pedal pulses.  Laboratory CBC    Component Value Date/Time   WBC 5.3 08/23/2021 0604   HGB 10.9 (L) 08/23/2021 0604   HCT 33.6 (L) 08/23/2021 0604   PLT 139 (L) 08/23/2021 0604    BMET    Component Value Date/Time   NA 138 08/23/2021 0604   NA 137 03/18/2021 1541   K 3.1 (L) 08/23/2021 0604   CL 110 08/23/2021 0604   CO2 23 08/23/2021 0604   GLUCOSE 162 (H) 08/23/2021 0604   BUN 17 08/23/2021 0604   BUN 15 03/18/2021 1541   CREATININE 0.39 (L) 08/23/2021 0604   CALCIUM 8.7 (L) 08/23/2021 0604   GFRNONAA >60 08/23/2021 0604    Assessment/Planning: POD #6 s/p RLE revascularization for ischemic leg with rest pain  Perfusion intact Deconditioned so needs to work with physical therapy Plan for discharge tomorrow to home with home physical therapy if felt to be safe by PT or to rehab if not felt to be safe by PT for discharge home   Leotis Pain  08/25/2021, 9:59 AM

## 2021-08-25 NOTE — Inpatient Diabetes Management (Addendum)
Inpatient Diabetes Program Recommendations  AACE/ADA: New Consensus Statement on Inpatient Glycemic Control   Target Ranges:  Prepandial:   less than 140 mg/dL      Peak postprandial:   less than 180 mg/dL (1-2 hours)      Critically ill patients:  140 - 180 mg/dL    Latest Reference Range & Units 08/24/21 08:45 08/24/21 12:03 08/24/21 16:17 08/24/21 21:29 08/25/21 07:40  Glucose-Capillary 70 - 99 mg/dL 190 (H) 206 (H) 87 227 (H) 250 (H)    Latest Reference Range & Units 12/10/20 04:42 08/19/21 21:07 08/22/21 08:11  Hemoglobin A1C 4.8 - 5.6 % 13.7 (H) 13.3 (H) 12.9 (H)   Review of Glycemic Control  Diabetes history: DM2 Outpatient Diabetes medications: Lantus 25 units daily, Novolog 6-7 units TID with meals Current orders for Inpatient glycemic control: Semglee 15 units QHS, Novolog 0-15 units TID with meals, Novolog 3 units TID with meals meals  Inpatient Diabetes Program Recommendations:    Insulin: Please consider adding Novolog bedtime correction scale and increasing Semglee to 20 units QHS.  HbgA1C: A1C 12.9% on 08/22/21 indicating an average glucose of 324 mg/dl over the past 2-3 months.  Outpatient DM: Patient is interested in getting FreeStyle Libre2 CGM prescribed. If agreeable, please provide Rx for FreeStyle Libre2 sensors ([phone]). Also, please consider discharging on Novolog correction scale (similar to being used inpatient) for patient to use along with Novolog meal coverage she is already taking at home.  Addendum 08/25/21'@11'$ :20-Spoke with patient at bedside about diabetes and home regimen for diabetes control. Patient reports she has DM2  and is being followed by PCP for diabetes management but notes that she has an appointment on October 3rd to establish care with an Endocrinologist in [place].  Patient reports she is taking Lantus 25 units daily and Novolog 6-7 units TID with meals as an outpatient for diabetes control. Patient reports taking DM medications as prescribed.    Patient reports checking glucose and states that it is usually in the 200's mg/dl. Patient states that she would like to try to get on a FreeStyle Libre CGM but not sure if insurance would approve.  Discussed A1C results (12.9% on 08/22/21) and explained that current A1C indicates an average glucose of 324 mg/dl over the past 2-3 months. Discussed glucose and A1C goals. Discussed importance of checking CBGs and maintaining good CBG control to prevent long-term and short-term complications. Explained how hyperglycemia leads to damage within blood vessels which lead to the common complications seen with uncontrolled diabetes. Stressed to the patient the importance of improving glycemic control to prevent further complications from uncontrolled diabetes. Discussed impact of nutrition, exercise, stress, sickness, and medications on diabetes control.  Patient reports that she has never used a correction scale in the past. Explained how a correction scale works and discussed difference in meal coverage insulin and correction scale. Patient state she would be agreeable to use a correction scale if prescribed at discharge. Discussed FreeStyle Mike Craze and how it works; also showed patient a YouTube video on how to apply.  Informed patient that it would be requested that provider prescribe FreeStyle Libre2 CGM sensor at discharge but if not covered by insurance then she would need to work with PCP/Endocrinologist about trying to get it approved by insurance.  Stressed need to improve DM control and encouraged patient to reach out to her PCP if she is having CBGs consistently over 180 mg/dl at home.  Patient verbalized understanding of information discussed and reports no further questions  at this time related to diabetes.  Thanks, Barnie Alderman, RN, MSN, [place] Diabetes Coordinator Inpatient Diabetes Program [phone] (Team Pager from 8am to [place])

## 2021-08-25 NOTE — TOC Progression Note (Signed)
Transition of Care [facility]) - Progression Note    Patient Details  Name: Beth Carroll MRN: [id number] Date of Birth: May 01, 1970  Transition of Care [facility]) CM/SW [place], LCSW Phone Number: 08/25/2021, 1:23 PM  Clinical Narrative:  PT has changed recommendation to home health. CSW starting home health search.   Expected Discharge Plan and Services                                                 Social Determinants of Health (SDOH) Interventions    Readmission Risk Interventions     No data to display

## 2021-08-25 NOTE — Progress Notes (Signed)
Physical Therapy Treatment Patient Details Name: Beth Carroll MRN: [id number] DOB: 06/07/1970 Today's Date: 08/25/2021   History of Present Illness Beth Carroll is a 51 y.o. female past medical history of coronary artery disease, diabetes, heart failure with reduced ejection fraction, hyperlipidemia, hypertension, peripheral arterial disease, tobacco abuse, ischemic cardiomyopathy, admitted for right lower extremity thrombectomy which was done on 08/19/2021.  The on-call neurosurgeon was called today because the floor care team felt that the patient was more drowsy.  Head CT was done that shows a small area of hyperdensity concerning for ICH in the left occipital lobe.  Neurological consultation was obtained for the [place]. Follow CT head scan R/O ICH.    PT Comments    Pt received in chair motivated to participate in skilled PT interventions. Pt reported and demonstrated HEP which she performs every 3 hours independently. Pt demonstrates edema in BLE R>L, decreased Ant tib strength of trance  and hence decreased DF. Pt transfers with CGA of 1, ambulated in room with RW with CGA of 1 with decreased loading response to RLE and heel strike absent. Pt also has difficulty placing her entire foot on the floor.  No tightness in R hamstring noted. Pt is demonstrating signs  of foot drop. Pt ascended and descended step using R hand rail and side ways stepping one step at time with min to CGA of 1. Pt slow but safe. Pt will benefit from stair training with significant other for a safe Discharge home. Pt will benefit form HH PT to return to PLOF.   Recommendations for follow up therapy are one component of a multi-disciplinary discharge planning process, led by the attending physician.  Recommendations may be updated based on patient status, additional functional criteria and insurance authorization.  Follow Up Recommendations  Home health PT Can patient physically be transported by private vehicle: Yes    Assistance Recommended at Discharge Intermittent Supervision/Assistance  Patient can return home with the following A little help with walking and/or transfers;A little help with bathing/dressing/bathroom;Assistance with cooking/housework;Assist for transportation;Help with stairs or ramp for entrance   Equipment Recommendations       Recommendations for Other Services       Precautions / Restrictions Precautions Precautions: Fall Restrictions Weight Bearing Restrictions: No     Mobility  Bed Mobility Overal bed mobility: Needs Assistance Bed Mobility:  (pt received in chair)                Transfers Overall transfer level: Needs assistance Equipment used: Rolling walker (2 wheels) Transfers: Sit to/from Stand Sit to Stand: Min guard           General transfer comment: difficulty WB on RLE without increase in pain but increased tingling in RLE. (pt demonstrates decreased DF.)    Ambulation/Gait Ambulation/Gait assistance: Min guard Gait Distance (Feet): 40 Feet Assistive device: Rolling walker (2 wheels) Gait Pattern/deviations: Step-to pattern Gait velocity: decreased     General Gait Details: foot drop noted 2/2 trace Ant-Tib stregnth   Stairs Stairs: Yes Stairs assistance: Min assist Stair Management: One rail Right Number of Stairs: 3 General stair comments: side ways ascending  and descending.   Wheelchair Mobility    Modified Rankin (Stroke Patients Only)       Balance Overall balance assessment: Modified Independent Sitting-balance support: No upper extremity supported, Bilateral upper extremity supported Sitting balance-Leahy Scale: Normal     Standing balance support: Bilateral upper extremity supported Standing balance-Leahy Scale: Fair Standing balance comment:  (guarding of RLE.)  Cognition Arousal/Alertness: Awake/alert Behavior During Therapy: WFL for tasks assessed/performed Overall  Cognitive Status: Within Functional Limits for tasks assessed                                        Pt Ind with HEP every 3 hours.   Exercises      General Comments        Pertinent Vitals/Pain Pain Assessment Pain Assessment: No/denies pain Pain Score: 3  Pain Location: posterior R knee/lower leg Pain Descriptors / Indicators: Aching, Sharp Pain Intervention(s): Premedicated before session    Home Living                          Prior Function            PT Goals (current goals can now be found in the care plan section) Acute Rehab PT Goals Patient Stated Goal: " I want to get stron and get back to independence, PT Goal Formulation: With patient Time For Goal Achievement: 09/05/21 Potential to Achieve Goals: Good Progress towards PT goals: Progressing toward goals    Frequency    Min 2X/week      PT Plan Current plan remains appropriate    Co-evaluation              AM-PAC PT "6 Clicks" Mobility   Outcome Measure  Help needed turning from your back to your side while in a flat bed without using bedrails?: None Help needed moving from lying on your back to sitting on the side of a flat bed without using bedrails?: None Help needed moving to and from a bed to a chair (including a wheelchair)?: A Little Help needed standing up from a chair using your arms (e.g., wheelchair or bedside chair)?: A Little Help needed to walk in hospital room?: A Little Help needed climbing 3-5 steps with a railing? : A Little 6 Click Score: 20    End of Session Equipment Utilized During Treatment: Gait belt Activity Tolerance: Patient tolerated treatment well Patient left: in chair;with call bell/phone within reach;with family/visitor present Nurse Communication: Mobility status;Other (comment) (the R foot status) PT Visit Diagnosis: Unsteadiness on feet (R26.81);Other abnormalities of gait and mobility (R26.89);Muscle weakness (generalized)  (M62.81);Difficulty in walking, not elsewhere classified (R26.2);Dizziness and giddiness (R42);Pain Pain - Right/Left: Right Pain - part of body: Leg     Time: 9379-0240 PT Time Calculation (min) (ACUTE ONLY): 32 min  Charges:  $Gait Training: 8-22 mins $Therapeutic Activity: 8-22 mins                    Joaquin Music PT DPT 12:44 PM,08/25/21

## 2021-08-26 LAB — GLUCOSE, CAPILLARY
Glucose-Capillary: 272 mg/dL — ABNORMAL HIGH (ref 70–99)
Glucose-Capillary: 201 mg/dL — ABNORMAL HIGH (ref 70–99)
Glucose-Capillary: 325 mg/dL — ABNORMAL HIGH (ref 70–99)

## 2021-08-26 MED ORDER — APIXABAN 5 MG PO TABS: 5.0000 mg | ORAL_TABLET | Freq: Two times a day (BID) | ORAL | 3 refills | Status: DC

## 2021-08-26 MED ORDER — TRAMADOL HCL 50 MG PO TABS: 50.0000 mg | ORAL_TABLET | Freq: Four times a day (QID) | ORAL | 0 refills | Status: DC | PRN

## 2021-08-26 NOTE — Discharge Summary (Signed)
[facility]    Discharge Summary    Patient ID:  Beth Carroll MRN: [id number] DOB/AGE: 06/18/1970 51 y.o.  Admit date: 08/19/2021 Discharge date: 08/26/2021 Date of Surgery: 08/19/2021 Surgeon: Surgeon(s): Dew, Erskine Squibb, MD  Admission Diagnosis: Ischemic leg [I99.8]  Discharge Diagnoses:  Ischemic leg [I99.8]  Secondary Diagnoses: Past Medical History:  Diagnosis Date   Coronary artery disease    Diabetes mellitus without complication ([place])    Diabetes mellitus, type 2 ([place])    HFrEF (heart failure with reduced ejection fraction) ([place])    Hyperlipemia    Hypertension    Ischemic cardiomyopathy    Orthostatic hypotension    PAD (peripheral artery disease) ([place])    Tobacco use     Procedure(s): Lower Extremity Angiography  Discharged Condition: good  HPI:  Beth Carroll is a 51 year old female who presented on 08/19/2021 due to ischemia of the right lower extremity.  The patient underwent right lower extremity angiogram which included extended thrombolytic therapy.  Overnight she also received Aggrastat to help with the patency of her recent intervention.  After recent intervention.  The patient had noted lethargy postprocedure initially felt to be due to sedation and pain medication.  The lethargy and somnolence continued and the patient underwent a CT scan which revealed a small hemorrhage in the superior left occipital lobe.  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  Following cessation decrease of significant opioid medication the patient became more alert but it was noted that she had significant deconditioning.  The patient required intensive work and assistance from physical therapy the patient has progressed to a stable point for discharge home.  She does continue to have some deconditioning but will likely require outpatient physical therapy.  Hospital Course:   Beth Carroll is a 51 y.o. female is S/P Right Procedure(s): Lower Extremity Angiography Extubated: POD # 0 Physical exam: Bilateral feet warm, groin clean dry and intact with no evidence of hematoma.  Mild edema bilaterally Post-op wounds clean, dry, intact or healing well Pt. Ambulating, voiding and taking PO diet without difficulty. Pt pain controlled with PO pain meds. Labs as below Complications: Deconditioning and noted deep tissue injury on the left heel  Consults: Neurology   Significant Diagnostic Studies: CBC Lab Results  Component Value Date   WBC 5.3 08/23/2021   HGB 10.9 (L) 08/23/2021   HCT 33.6 (L) 08/23/2021   MCV 89.1 08/23/2021   PLT 139 (L) 08/23/2021    BMET    Component Value Date/Time   NA 138 08/23/2021 0604   NA 137 03/18/2021 1541   K 3.1 (L) 08/23/2021 0604   CL 110 08/23/2021 0604   CO2 23 08/23/2021 0604   GLUCOSE 162 (H) 08/23/2021 0604   BUN 17 08/23/2021 0604   BUN 15 03/18/2021 1541   CREATININE 0.39 (L) 08/23/2021 0604   CALCIUM 8.7 (L) 08/23/2021 0604   GFRNONAA >60 08/23/2021 0604   COAG Lab Results  Component Value Date   INR 1.5 (H) 08/19/2021   INR 1.2 12/09/2020     Disposition:  Discharge to :Home  Allergies as of 08/26/2021   No Known Allergies      Medication List     STOP taking these medications    clopidogrel 75 MG tablet Commonly known as: PLAVIX       TAKE these medications    albuterol 108 (90 Base) MCG/ACT  inhaler Commonly known as: VENTOLIN HFA Inhale 2 puffs into the lungs every 6 (six) hours as needed.   apixaban 5 MG Tabs tablet Commonly known as: ELIQUIS Take 1 tablet (5 mg total) by mouth 2 (two) times daily.   aspirin EC 81 MG tablet Take 1 tablet (81 mg total) by mouth daily. Swallow whole.   atorvastatin 20 MG tablet Commonly known as: LIPITOR Take 1 tablet (20 mg total) by mouth daily.   carvedilol 3.125 MG tablet Commonly known as: COREG Take 1 tablet (3.125 mg total) by  mouth 2 (two) times daily with a meal.   Farxiga 10 MG Tabs tablet Generic drug: dapagliflozin propanediol TAKE 1 TABLET BY MOUTH DAILY BEFORE BREAKFAST.   furosemide 20 MG tablet Commonly known as: LASIX Take 20 mg by mouth daily.   insulin aspart 100 UNIT/ML FlexPen Commonly known as: NOVOLOG Inject 6-7 Units into the skin 3 (three) times daily with meals.   Lantus SoloStar 100 UNIT/ML Solostar Pen Generic drug: insulin glargine Inject 25 Units into the skin daily.   losartan 25 MG tablet Commonly known as: COZAAR Take 0.5 tablets (12.5 mg total) by mouth daily.   pantoprazole 40 MG tablet Commonly known as: PROTONIX Take 1 tablet (40 mg total) by mouth daily.   tiotropium 18 MCG inhalation capsule Commonly known as: SPIRIVA Place 18 mcg into inhaler and inhale daily.   traMADol 50 MG tablet Commonly known as: ULTRAM Take 1 tablet (50 mg total) by mouth every 6 (six) hours as needed for severe pain.   Vitamin D (Ergocalciferol) 1.25 MG (50000 UNIT) Caps capsule Commonly known as: DRISDOL Take 50,000 Units by mouth once a week.       Verbal and written Discharge instructions given to the patient. Wound care per Discharge AVS  Follow-up Information     Kris Hartmann, NP Follow up in 3 week(s).   Specialty: Vascular Surgery Why: SeeJD/FB in 3 weeks with ABI Contact information: [place] [address] [phone]         Baxter Hire, MD. Schedule an appointment as soon as possible for a visit.   Specialty: Internal Medicine Why: Call to discuss Freestyle Meter and diabetic medication adjustment given hyperglycemia Contact information: [place] [address] [phone]         Minna Merritts, MD .   Specialty: Cardiology Contact information: [address] [place] [address] [phone]                 Signed: Kris Hartmann, NP  08/26/2021, 2:22 PM

## 2021-08-26 NOTE — Progress Notes (Signed)
Physical Therapy Treatment Patient Details Name: Beth Carroll MRN: [id number] DOB: 12-26-1970 Today's Date: 08/26/2021   History of Present Illness Beth Carroll is a 51 y.o. female past medical history of coronary artery disease, diabetes, heart failure with reduced ejection fraction, hyperlipidemia, hypertension, peripheral arterial disease, tobacco abuse, ischemic cardiomyopathy, admitted for right lower extremity thrombectomy which was done on 08/19/2021.  The on-call neurosurgeon was called today because the floor care team felt that the patient was more drowsy.  Head CT was done that shows a small area of hyperdensity concerning for ICH in the left occipital lobe.  Neurological consultation was obtained for the [place]. Follow CT head scan R/O ICH.    PT Comments    Pt is progressing well with mobility requiring less A with transfers, gait, and stairs performing each at supervision level with increased time needed. Current d/c plan for home with HHPT is still appropriate.   Recommendations for follow up therapy are one component of a multi-disciplinary discharge planning process, led by the attending physician.  Recommendations may be updated based on patient status, additional functional criteria and insurance authorization.  Follow Up Recommendations  Home health PT Can patient physically be transported by private vehicle: Yes   Assistance Recommended at Discharge Intermittent Supervision/Assistance  Patient can return home with the following A little help with walking and/or transfers;A little help with bathing/dressing/bathroom;Assistance with cooking/housework;Assist for transportation;Help with stairs or ramp for entrance   Equipment Recommendations  Rolling walker (2 wheels);BSC/3in1    Recommendations for Other Services       Precautions / Restrictions Precautions Precautions: Fall Restrictions Weight Bearing Restrictions: No     Mobility  Bed Mobility                General bed mobility comments: pt up to the chair on arrival for PT    Transfers Overall transfer level: Needs assistance Equipment used: Rolling walker (2 wheels) Transfers: Sit to/from Stand Sit to Stand: Supervision Stand pivot transfers: Supervision Step pivot transfers: Supervision       General transfer comment: difficulty WB on RLE without increase in pain. Educated pt on how to increase WB tolerance slowly.    Ambulation/Gait Ambulation/Gait assistance: Supervision   Assistive device: Rolling walker (2 wheels) Gait Pattern/deviations: Step-to pattern, Antalgic Gait velocity: decreased     General Gait Details: Pt put very little weight on  R foot but was able to transfer sufficient amount of weight to negotiate stairs with R rail (side stepping pattern)   Stairs Stairs: Yes Stairs assistance: Supervision Stair Management: One rail Right Number of Stairs: 3 General stair comments: side ways ascending  and descending.   Wheelchair Mobility    Modified Rankin (Stroke Patients Only)       Balance Overall balance assessment: Modified Independent Sitting-balance support: No upper extremity supported, Feet supported Sitting balance-Leahy Scale: Normal     Standing balance support: No upper extremity supported, During functional activity Standing balance-Leahy Scale: Fair                              Cognition Arousal/Alertness: Awake/alert Behavior During Therapy: WFL for tasks assessed/performed Overall Cognitive Status: Within Functional Limits for tasks assessed                                          Exercises  General Comments        Pertinent Vitals/Pain Pain Assessment Pain Assessment: No/denies pain Faces Pain Scale: Hurts a little bit Pain Location: R LE Pain Descriptors / Indicators: Discomfort, Sore Pain Intervention(s): Limited activity within patient's tolerance    Home Living                           Prior Function            PT Goals (current goals can now be found in the care plan section) Acute Rehab PT Goals Patient Stated Goal: " I want to get strong and get back to independence, PT Goal Formulation: With patient Time For Goal Achievement: 09/05/21 Potential to Achieve Goals: Good Progress towards PT goals: Progressing toward goals    Frequency    Min 2X/week      PT Plan Current plan remains appropriate    Co-evaluation              AM-PAC PT "6 Clicks" Mobility   Outcome Measure  Help needed turning from your back to your side while in a flat bed without using bedrails?: None Help needed moving from lying on your back to sitting on the side of a flat bed without using bedrails?: None Help needed moving to and from a bed to a chair (including a wheelchair)?: A Little Help needed standing up from a chair using your arms (e.g., wheelchair or bedside chair)?: A Little Help needed to walk in hospital room?: A Little Help needed climbing 3-5 steps with a railing? : A Little 6 Click Score: 20    End of Session Equipment Utilized During Treatment: Gait belt Activity Tolerance: Patient tolerated treatment well Patient left: in chair;with call bell/phone within reach;with family/visitor present Nurse Communication: Mobility status;Other (comment) PT Visit Diagnosis: Unsteadiness on feet (R26.81);Other abnormalities of gait and mobility (R26.89);Muscle weakness (generalized) (M62.81);Difficulty in walking, not elsewhere classified (R26.2);Dizziness and giddiness (R42);Pain Pain - Right/Left: Right Pain - part of body: Leg     Time: 1100-1125 PT Time Calculation (min) (ACUTE ONLY): 25 min  Charges:  $Gait Training: 8-22 mins $Therapeutic Activity: 8-22 mins                    Bjorn Loser, PTA  08/26/21, 12:45 PM

## 2021-08-26 NOTE — TOC Transition Note (Signed)
Transition of Care [facility]) - CM/SW Discharge Note   Patient Details  Name: Beth Carroll MRN: [id number] Date of Birth: Jun 14, 1970  [facility]) CM/SW Contact:  Laurena Slimmer, RN Phone Number: 08/26/2021, 1:45 PM   Clinical Narrative:    Referral accepted by [facility] from [facility]. Spoke with patient by phone. Patient agreeable with HH. Patient's boyfriend will transport her home. Denied any other questions. TOC signing off.          Patient Goals and CMS Choice        Discharge Placement                       Discharge Plan and Services                                     Social Determinants of Health (SDOH) Interventions     Readmission Risk Interventions     No data to display

## 2021-08-26 NOTE — Inpatient Diabetes Management (Signed)
Inpatient Diabetes Program Recommendations  AACE/ADA: New Consensus Statement on Inpatient Glycemic Control   Target Ranges:  Prepandial:   less than 140 mg/dL      Peak postprandial:   less than 180 mg/dL (1-2 hours)      Critically ill patients:  140 - 180 mg/dL    Latest Reference Range & Units 08/25/21 07:40 08/25/21 11:44 08/25/21 15:59 08/25/21 20:52  Glucose-Capillary 70 - 99 mg/dL 250 (H) 225 (H) 272 (H) 253 (H)   Review of Glycemic Control  Diabetes history: DM2 Outpatient Diabetes medications: Lantus 25 units daily, Novolog 6-7 units TID with meals Current orders for Inpatient glycemic control: Semglee 15 units QHS, Novolog 0-15 units TID with meals, Novolog 3 units TID with meals meals   Inpatient Diabetes Program Recommendations:     Insulin: Please consider adding Novolog bedtime correction scale and increasing Semglee to 20 units QHS.   HbgA1C: A1C 12.9% on 08/22/21 indicating an average glucose of 324 mg/dl over the past 2-3 months.   Outpatient DM: Patient is interested in getting FreeStyle Libre2 CGM prescribed. If agreeable, please provide Rx for FreeStyle Libre2 sensors [phone]). Also, please consider discharging on Novolog correction scale (similar to being used inpatient) for patient to use along with Novolog meal coverage she is already taking at home.   Thanks, Barnie Alderman, RN, MSN, [place] Diabetes Coordinator Inpatient Diabetes Program [phone] (Team Pager from 8am to [place])

## 2021-08-26 NOTE — Plan of Care (Signed)
  Problem: Education: Goal: Knowledge of General Education information will improve Description: Including pain rating scale, medication(s)/side effects and non-pharmacologic comfort measures Outcome: Adequate for Discharge   Problem: Health Behavior/Discharge Planning: Goal: Ability to manage health-related needs will improve Outcome: Adequate for Discharge   Problem: Clinical Measurements: Goal: Ability to maintain clinical measurements within normal limits will improve Outcome: Adequate for Discharge Goal: Will remain free from infection Outcome: Adequate for Discharge Goal: Diagnostic test results will improve Outcome: Adequate for Discharge Goal: Respiratory complications will improve Outcome: Adequate for Discharge Goal: Cardiovascular complication will be avoided Outcome: Adequate for Discharge   Problem: Activity: Goal: Risk for activity intolerance will decrease Outcome: Adequate for Discharge   Problem: Nutrition: Goal: Adequate nutrition will be maintained Outcome: Adequate for Discharge   Problem: Coping: Goal: Level of anxiety will decrease Outcome: Adequate for Discharge   Problem: Elimination: Goal: Will not experience complications related to bowel motility Outcome: Adequate for Discharge Goal: Will not experience complications related to urinary retention Outcome: Adequate for Discharge   Problem: Pain Managment: Goal: General experience of comfort will improve Outcome: Adequate for Discharge   Problem: Safety: Goal: Ability to remain free from injury will improve Outcome: Adequate for Discharge   Problem: Skin Integrity: Goal: Risk for impaired skin integrity will decrease Outcome: Adequate for Discharge   Problem: Education: Goal: Ability to describe self-care measures that may prevent or decrease complications (Diabetes Survival Skills Education) will improve Outcome: Adequate for Discharge Goal: Individualized Educational Video(s) Outcome:  Adequate for Discharge   Problem: Coping: Goal: Ability to adjust to condition or change in health will improve Outcome: Adequate for Discharge   Problem: Fluid Volume: Goal: Ability to maintain a balanced intake and output will improve Outcome: Adequate for Discharge   Problem: Health Behavior/Discharge Planning: Goal: Ability to identify and utilize available resources and services will improve Outcome: Adequate for Discharge Goal: Ability to manage health-related needs will improve Outcome: Adequate for Discharge   Problem: Metabolic: Goal: Ability to maintain appropriate glucose levels will improve Outcome: Adequate for Discharge   Problem: Nutritional: Goal: Maintenance of adequate nutrition will improve Outcome: Adequate for Discharge Goal: Progress toward achieving an optimal weight will improve Outcome: Adequate for Discharge   Problem: Skin Integrity: Goal: Risk for impaired skin integrity will decrease Outcome: Adequate for Discharge   Problem: Tissue Perfusion: Goal: Adequacy of tissue perfusion will improve Outcome: Adequate for Discharge   Problem: Education: Goal: Knowledge of disease or condition will improve Outcome: Adequate for Discharge Goal: Knowledge of secondary prevention will improve (SELECT ALL) Outcome: Adequate for Discharge Goal: Knowledge of patient specific risk factors will improve (INDIVIDUALIZE FOR PATIENT) Outcome: Adequate for Discharge

## 2021-08-26 NOTE — Progress Notes (Signed)
Occupational Therapy Treatment Patient Details Name: Beth Carroll MRN: [id number] DOB: [birth year] Today's Date: 08/26/2021   History of present illness Beth Carroll is a 51 y.o. female past medical history of coronary artery disease, diabetes, heart failure with reduced ejection fraction, hyperlipidemia, hypertension, peripheral arterial disease, tobacco abuse, ischemic cardiomyopathy, admitted for right lower extremity thrombectomy which was done on 08/19/2021.  The on-call neurosurgeon was called today because the floor care team felt that the patient was more drowsy.  Head CT was done that shows a small area of hyperdensity concerning for ICH in the left occipital lobe.  Neurological consultation was obtained for the [facility]. Follow CT head scan R/O ICH.   OT comments  Pt seen for OT treatment on this date. Upon arrival to room pt awake and alert, lying in bed with HOB elevated. Pt agreeable to tx focused on dressing and toileting/toilet t/f. Pt requiring SUPERVISION for supine>sit and CGA for STS. SUPERVISION for donning/doffing shirt and socks while seated EOB. CGA + RW for donning/doffing pants and underpants sit/stand. CGA + RW with min vcs for safe hand placement for toilet t/f. SBA for pericare while seated. CGA for grooming tasks at sink in standing. Pt educated on safe ADL participation. Pt making good progress toward goals. Pt continues to benefit from skilled OT services to maximize return to PLOF and minimize risk of future falls, injury, caregiver burden, and readmission. Will continue to follow POC. Discharge recommendation updated to [facility] based on pt progress.     Recommendations for follow up therapy are one component of a multi-disciplinary discharge planning process, led by the attending physician.  Recommendations may be updated based on patient status, additional functional criteria and insurance authorization.    Follow Up Recommendations  Home health OT    Assistance Recommended at  Discharge Frequent or constant Supervision/Assistance  Patient can return home with the following  A little help with walking and/or transfers;A little help with bathing/dressing/bathroom;Assistance with cooking/housework;Assist for transportation   Equipment Recommendations  Tub/shower seat    Recommendations for Other Services      Precautions / Restrictions Precautions Precautions: Fall Restrictions Weight Bearing Restrictions: No       Mobility Bed Mobility Overal bed mobility: Needs Assistance Bed Mobility: Supine to Sit     Supine to sit: Supervision          Transfers Overall transfer level: Needs assistance Equipment used: Rolling walker (2 wheels) Transfers: Sit to/from Stand Sit to Stand: Min guard                 Balance Overall balance assessment: Modified Independent Sitting-balance support: No upper extremity supported, Feet supported Sitting balance-Leahy Scale: Normal     Standing balance support: No upper extremity supported, During functional activity Standing balance-Leahy Scale: Fair                             ADL either performed or assessed with clinical judgement   ADL Overall ADL's : Needs assistance/impaired                                       General ADL Comments: SUPERVISION for donning/doffing shirt and socks while seated EOB. CGA + RW for donning/doffing pants and underpants sit/stand. CGA + RW with min vcs for safe hand placement for toilet t/f. SBA for pericare while seated. CGA  for grooming tasks at sink in standing.      Cognition Arousal/Alertness: Awake/alert Behavior During Therapy: WFL for tasks assessed/performed Overall Cognitive Status: Within Functional Limits for tasks assessed                                                     Pertinent Vitals/ Pain       Pain Assessment Pain Assessment: Faces Faces Pain Scale: Hurts a little bit Pain Location: R  LE Pain Descriptors / Indicators: Discomfort, Sore Pain Intervention(s): Limited activity within patient's tolerance, Repositioned, Monitored during session   Frequency  Min 2X/week        Progress Toward Goals  OT Goals(current goals can now be found in the care plan section)  Progress towards OT goals: Progressing toward goals  Acute Rehab OT Goals Patient Stated Goal: to go home OT Goal Formulation: With patient Time For Goal Achievement: 09/05/21 Potential to Achieve Goals: Good ADL Goals Pt Will Perform Lower Body Bathing: with modified independence Pt Will Perform Lower Body Dressing: with modified independence Pt Will Transfer to Toilet: with min guard assist  Plan Discharge plan needs to be updated;Frequency remains appropriate       AM-PAC OT "6 Clicks" Daily Activity     Outcome Measure   Help from another person eating meals?: None Help from another person taking care of personal grooming?: None Help from another person toileting, which includes using toliet, bedpan, or urinal?: A Little Help from another person bathing (including washing, rinsing, drying)?: A Lot Help from another person to put on and taking off regular upper body clothing?: None Help from another person to put on and taking off regular lower body clothing?: A Little 6 Click Score: 20    End of Session Equipment Utilized During Treatment: Rolling walker (2 wheels)  OT Visit Diagnosis: Unsteadiness on feet (R26.81);Muscle weakness (generalized) (M62.81);Pain Pain - Right/Left: Right Pain - part of body: Leg   Activity Tolerance Patient tolerated treatment well   Patient Left in chair;with call bell/phone within reach   Nurse Communication          Time: 8016-5537 OT Time Calculation (min): 30 min  Charges: OT General Charges $OT Visit: 1 Visit OT Treatments $Self Care/Home Management : 23-37 mins  [employer] OTDS  [employer] 08/26/2021, 1:24 PM

## 2021-08-27 ENCOUNTER — Other Ambulatory Visit: Payer: Self-pay

## 2021-08-29 ENCOUNTER — Other Ambulatory Visit: Payer: Self-pay | Admitting: Medical

## 2021-08-31 ENCOUNTER — Telehealth (INDEPENDENT_AMBULATORY_CARE_PROVIDER_SITE_OTHER): Payer: Self-pay

## 2021-08-31 NOTE — Telephone Encounter (Signed)
She had that at the hospital before she discharged.  It was a Deep tissue injury, as long as the area doesn't open or become necrotic, I'm ok to monitor

## 2021-08-31 NOTE — Telephone Encounter (Signed)
Beth Carroll from [place] had been made aware with medical recommendations

## 2021-09-03 NOTE — Interval H&P Note (Signed)
History and Physical Interval Note:  09/03/2021 10:55 AM  Beth Carroll  has presented today for surgery, with the diagnosis of atherosclerosis obliterans with rest pain.  The various methods of treatment have been discussed with the patient and family. After consideration of risks, benefits and other options for treatment, the patient has consented to  Procedure(s): Lower Extremity Angiography (Right) as a surgical intervention.  The patient's history has been reviewed, patient examined, no change in status, stable for surgery.  I have reviewed the patient's chart and labs.  Questions were answered to the patient's satisfaction.     Leotis Pain

## 2021-09-04 ENCOUNTER — Other Ambulatory Visit (INDEPENDENT_AMBULATORY_CARE_PROVIDER_SITE_OTHER): Payer: Self-pay | Admitting: Nurse Practitioner

## 2021-09-09 ENCOUNTER — Telehealth (INDEPENDENT_AMBULATORY_CARE_PROVIDER_SITE_OTHER): Payer: Self-pay

## 2021-09-09 ENCOUNTER — Ambulatory Visit: Payer: No Typology Code available for payment source | Admitting: Cardiovascular Disease

## 2021-09-10 NOTE — Telephone Encounter (Signed)
Becky Sax has been made aware

## 2021-09-10 NOTE — Telephone Encounter (Signed)
That's fine

## 2021-09-17 ENCOUNTER — Other Ambulatory Visit (INDEPENDENT_AMBULATORY_CARE_PROVIDER_SITE_OTHER): Payer: Self-pay | Admitting: Vascular Surgery

## 2021-09-17 ENCOUNTER — Telehealth (INDEPENDENT_AMBULATORY_CARE_PROVIDER_SITE_OTHER): Payer: Self-pay | Admitting: Vascular Surgery

## 2021-09-17 DIAGNOSIS — I70221 Atherosclerosis of native arteries of extremities with rest pain, right leg: Secondary | ICD-10-CM

## 2021-09-17 DIAGNOSIS — Z95820 Peripheral vascular angioplasty status with implants and grafts: Secondary | ICD-10-CM

## 2021-09-17 NOTE — Telephone Encounter (Signed)
Physical therapy called in wanting verbal orders for :   2week x 2 Week  1 week x 1 week   Please call and advise

## 2021-09-18 ENCOUNTER — Encounter (INDEPENDENT_AMBULATORY_CARE_PROVIDER_SITE_OTHER): Payer: Self-pay | Admitting: Nurse Practitioner

## 2021-09-18 ENCOUNTER — Ambulatory Visit (INDEPENDENT_AMBULATORY_CARE_PROVIDER_SITE_OTHER): Payer: No Typology Code available for payment source

## 2021-09-18 ENCOUNTER — Ambulatory Visit (INDEPENDENT_AMBULATORY_CARE_PROVIDER_SITE_OTHER): Payer: No Typology Code available for payment source | Admitting: Nurse Practitioner

## 2021-09-18 VITALS — BP 108/72 | HR 79 | Resp 17 | Ht 66.0 in | Wt 118.0 lb

## 2021-09-18 DIAGNOSIS — I70221 Atherosclerosis of native arteries of extremities with rest pain, right leg: Secondary | ICD-10-CM

## 2021-09-18 DIAGNOSIS — Z95820 Peripheral vascular angioplasty status with implants and grafts: Secondary | ICD-10-CM

## 2021-09-18 DIAGNOSIS — L97419 Non-pressure chronic ulcer of right heel and midfoot with unspecified severity: Secondary | ICD-10-CM

## 2021-09-18 DIAGNOSIS — I10 Essential (primary) hypertension: Secondary | ICD-10-CM

## 2021-09-18 NOTE — Telephone Encounter (Signed)
Beth Carroll was made aware that orders are fine to continue for PT

## 2021-09-19 ENCOUNTER — Encounter (INDEPENDENT_AMBULATORY_CARE_PROVIDER_SITE_OTHER): Payer: Self-pay | Admitting: Nurse Practitioner

## 2021-09-19 NOTE — Progress Notes (Signed)
Subjective:    Patient ID: Beth Carroll, female    DOB: [birth year], 51 y.o.   MRN: [id number] Chief Complaint  Patient presents with   Follow-up    ultrasound    The patient returns to the office for followup and review of the noninvasive studies.  The patient underwent intervention on 08/19/2021 for right lower extremity ischemia.  Immediately following intervention she was found to be well-perfused but she had significant somnolence and there was concern that the patient may have had a stroke.  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  Since follow-up patient has no focal neurological deficits however she notes that her toes on her right lower extremity she is having some difficulty with movement.  Prior to her discharge patient had a small deep tissue injury on the left heel.  That deep tissue injury remains but it is improving.  What is new is an injury to her right heel.  This wound initially began rupture and now encompasses a large area.  It has minimal drainage.  The patient also notes to having significant edema bilaterally.    There have been no significant changes to the patient's overall health care.  The patient denies amaurosis fugax or recent TIA symptoms. There are no documented recent neurological changes noted. There is no history of DVT, PE or superficial thrombophlebitis. The patient denies recent episodes of angina or shortness of breath.   ABI Rt=1.10 and Lt=1.00  (previous ABI's Rt=0.26 and Lt=0.95) Duplex ultrasound of the right tibial arteries shows triphasic waveforms with biphasic/triphasic waveforms in the left.  The patient has normal TBI's bilaterally.  Normal toe waveforms bilaterally.    Review of Systems  Cardiovascular:  Positive for leg swelling.  Musculoskeletal:  Positive for gait problem.  Skin:  Positive for wound.  All other systems reviewed and are negative.      Objective:   Physical  Exam Vitals reviewed.  HENT:     Head: Normocephalic.  Cardiovascular:     Rate and Rhythm: Normal rate.  Pulmonary:     Effort: Pulmonary effort is normal.  Musculoskeletal:     Right lower leg: 2+ Pitting Edema present.     Left lower leg: 2+ Pitting Edema present.  Skin:    General: Skin is warm and dry.  Neurological:     Mental Status: She is alert and oriented to person, place, and time.     Gait: Gait abnormal.  Psychiatric:        Mood and Affect: Mood normal.        Behavior: Behavior normal.        Thought Content: Thought content normal.        Judgment: Judgment normal.       BP 108/72 (BP Location: Left Arm)   Pulse 79   Resp 17   Ht '5\' 6"'$  (1.676 m)   Wt 118 lb (53.5 kg)   BMI 19.05 kg/m   Past Medical History:  Diagnosis Date   Coronary artery disease    Diabetes mellitus without complication (HCC)    Diabetes mellitus, type 2 (HCC)    HFrEF (heart failure with reduced ejection fraction) ([place])    Hyperlipemia    Hypertension    Ischemic cardiomyopathy    Orthostatic hypotension    PAD (peripheral artery disease) (HCC)    Tobacco use     Social History   Socioeconomic History   Marital status:  Single    Spouse name: Not on file   Number of children: Not on file   Years of education: Not on file   Highest education level: Not on file  Occupational History   Not on file  Tobacco Use   Smoking status: Former    Types: Cigarettes    Quit date: 12/09/2020    Years since quitting: 0.7   Smokeless tobacco: Never  Vaping Use   Vaping Use: Never used  Substance and Sexual Activity   Alcohol use: Never   Drug use: Not on file   Sexual activity: Not on file  Other Topics Concern   Not on file  Social History Narrative   Not on file   Social Determinants of Health   Financial Resource Strain: Not on file  Food Insecurity: Not on file  Transportation Needs: Not on file  Physical Activity: Not on file  Stress: Not on file  Social  Connections: Not on file  Intimate Partner Violence: Not on file    Past Surgical History:  Procedure Laterality Date   CORONARY STENT INTERVENTION N/A 12/15/2020   Procedure: CORONARY STENT INTERVENTION;  Surgeon: Wellington Hampshire, MD;  Location: [facility];  Service: Cardiovascular;  Laterality: N/A;   LEFT HEART CATH AND CORONARY ANGIOGRAPHY N/A 12/15/2020   Procedure: LEFT HEART CATH AND CORONARY ANGIOGRAPHY;  Surgeon: Wellington Hampshire, MD;  Location: [facility];  Service: Cardiovascular;  Laterality: N/A;   LOWER EXTREMITY ANGIOGRAPHY Left 12/10/2020   Procedure: Lower Extremity Angiography;  Surgeon: Algernon Huxley, MD;  Location: [facility];  Service: Cardiovascular;  Laterality: Left;   LOWER EXTREMITY ANGIOGRAPHY Right 12/25/2020   Procedure: LOWER EXTREMITY ANGIOGRAPHY;  Surgeon: Algernon Huxley, MD;  Location: [facility];  Service: Cardiovascular;  Laterality: Right;   LOWER EXTREMITY ANGIOGRAPHY Right 08/19/2021   Procedure: Lower Extremity Angiography;  Surgeon: Algernon Huxley, MD;  Location: [facility];  Service: Cardiovascular;  Laterality: Right;   LOWER EXTREMITY ANGIOGRAPHY Right 08/19/2021   Procedure: Lower Extremity Angiography;  Surgeon: Algernon Huxley, MD;  Location: [facility];  Service: Cardiovascular;  Laterality: Right;    Family History  Problem Relation Age of Onset   Hypertension Father     No Known Allergies     Latest Ref Rng & Units 08/23/2021    6:04 AM 08/22/2021    8:11 AM 08/21/2021    4:44 AM  CBC  WBC 4.0 - 10.5 K/uL 5.3  8.4  12.9   Hemoglobin 12.0 - 15.0 g/dL 10.9  11.0  11.5   Hematocrit 36.0 - 46.0 % 33.6  34.0  36.3   Platelets 150 - 400 K/uL 139  147  165       CMP     Component Value Date/Time   NA 138 08/23/2021 0604   NA 137 03/18/2021 1541   K 3.1 (L) 08/23/2021 0604   CL 110 08/23/2021 0604   CO2 23 08/23/2021 0604   GLUCOSE 162 (H) 08/23/2021 0604   BUN 17 08/23/2021  0604   BUN 15 03/18/2021 1541   CREATININE 0.39 (L) 08/23/2021 0604   CALCIUM 8.7 (L) 08/23/2021 0604   PROT 7.4 08/19/2021 2107   ALBUMIN 4.0 08/19/2021 2107   AST 22 08/19/2021 2107   ALT 14 08/19/2021 2107   ALKPHOS 75 08/19/2021 2107   BILITOT 1.7 (H) 08/19/2021 2107   GFRNONAA >60 08/23/2021 1610  VAS [place] ABI WITH/WO TBI  Result Date: 08/20/2021  LOWER EXTREMITY DOPPLER STUDY Patient Name:  JISSEL SLAVENS  Date of Exam:   08/17/2021 Medical Rec #: [id number]      Accession #:    [id number] Date of Birth: 1970/05/24      Patient Gender: F Patient Age:   48 years Exam Location:  [facility] Procedure:      VAS [place] ABI WITH/WO TBI Referring Phys: Eulogio Ditch --------------------------------------------------------------------------------  Indications: Claudication, rest pain, and peripheral artery disease. High Risk Factors: Hypertension, Diabetes, past history of smoking, coronary                    artery disease.  Vascular Interventions: 12/25/2020 right SFA DCPTA and stent. Performing Technologist: Delorise Shiner RVT  Examination Guidelines: A complete evaluation includes at minimum, Doppler waveform signals and systolic blood pressure reading at the level of bilateral brachial, anterior tibial, and posterior tibial arteries, when vessel segments are accessible. Bilateral testing is considered an integral part of a complete examination. Photoelectric Plethysmograph (PPG) waveforms and toe systolic pressure readings are included as required and additional duplex testing as needed. Limited examinations for reoccurring indications may be performed as noted.  ABI Findings: +---------+------------------+-----+-------------------+--------+ Right    Rt Pressure (mmHg)IndexWaveform           Comment  +---------+------------------+-----+-------------------+--------+ Brachial 197                                                 +---------+------------------+-----+-------------------+--------+ ATA      51                0.26 dampened monophasic         +---------+------------------+-----+-------------------+--------+ PTA      50                0.25 dampened monophasic         +---------+------------------+-----+-------------------+--------+ Great Toe0                 0.00                             +---------+------------------+-----+-------------------+--------+ +---------+------------------+-----+----------+-------+ Left     Lt Pressure (mmHg)IndexWaveform  Comment +---------+------------------+-----+----------+-------+ Brachial 185                                      +---------+------------------+-----+----------+-------+ ATA      173               0.88 monophasic        +---------+------------------+-----+----------+-------+ PTA      187               0.95 biphasic          +---------+------------------+-----+----------+-------+ Great Toe119               0.60                   +---------+------------------+-----+----------+-------+ +-------+-----------+-----------+------------+------------+ ABI/TBIToday's ABIToday's TBIPrevious ABIPrevious TBI +-------+-----------+-----------+------------+------------+ Right  0.26       0.00       0.97        0.62         +-------+-----------+-----------+------------+------------+ Left   0.95  0.60       1.05        0.88         +-------+-----------+-----------+------------+------------+  Right ABIs appear decreased compared to prior study on 04/15/2021. Compared to prior study on 04/15/2021.  Summary: Right: Resting right ankle-brachial index indicates critical limb ischemia. The right toe-brachial index is abnormal. Left: Resting left ankle-brachial index is within normal range. No evidence of significant left lower extremity arterial disease. The left toe-brachial index is abnormal. *See table(s) above for measurements and  observations.  Electronically signed by Leotis Pain MD on 08/20/2021 at 7:14:53 AM.    Final        Assessment & Plan:   1. 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  The patient does not voice lifestyle limiting changes at this point in time.  Noninvasive studies do not suggest clinically significant change.  No invasive studies, angiography or surgery at this time The patient should continue walking and begin a more formal exercise program.  The patient should continue antiplatelet therapy and aggressive treatment of the lipid abnormalities  No changes in the patient's medications at this time  Continued surveillance is indicated as atherosclerosis is likely to progress with time.    The patient will continue follow up with noninvasive studies as ordered.   - VAS [place] ABI WITH/WO TBI  2. Heel ulceration, right, with unspecified severity ([place]) The patient has a chronic area on her right heel.  This is concerning due to the ischemic nature that she previously had in her foot and concern that this may have a much deeper necrotic area.  We will send the patient to podiatry for wound evaluation and treatment.  Patient today has adequate perfusion for wound healing however we will have her return in 6 weeks to ensure that the wound is continuing to heal and there has been no setbacks. - Ambulatory referral to Podiatry  3. Primary hypertension Continue antihypertensive medications as already ordered, these medications have been reviewed and there are no changes at this time.     Current Outpatient Medications on File Prior to Visit  Medication Sig Dispense Refill   albuterol (VENTOLIN HFA) 108 (90 Base) MCG/ACT inhaler Inhale 2 puffs into the lungs every 6 (six) hours as needed.     apixaban (ELIQUIS) 5 MG TABS tablet Take 1 tablet (5 mg total) by mouth 2 (two) times  daily. 60 tablet 3   aspirin EC 81 MG EC tablet Take 1 tablet (81 mg total) by mouth daily. Swallow whole. 30 tablet 2   atorvastatin (LIPITOR) 20 MG tablet Take 1 tablet (20 mg total) by mouth daily. 30 tablet 0   carvedilol (COREG) 3.125 MG tablet Take 1 tablet (3.125 mg total) by mouth 2 (two) times daily with a meal. 60 tablet 0   FARXIGA 10 MG TABS tablet TAKE 1 TABLET BY MOUTH DAILY BEFORE BREAKFAST. 30 tablet 3   furosemide (LASIX) 20 MG tablet Take 20 mg by mouth daily.     insulin aspart (NOVOLOG) 100 UNIT/ML FlexPen Inject 6-7 Units into the skin 3 (three) times daily with meals.     insulin glargine (LANTUS SOLOSTAR) 100 UNIT/ML Solostar Pen Inject 25 Units into the skin daily.     losartan (COZAAR) 25 MG tablet TAKE 1/2 TABLET BY MOUTH EVERY DAY 90 tablet 3   pantoprazole (PROTONIX) 40 MG tablet Take  1 tablet (40 mg total) by mouth daily. 30 tablet 0   tiotropium (SPIRIVA) 18 MCG inhalation capsule Place 18 mcg into inhaler and inhale daily.     traMADol (ULTRAM) 50 MG tablet Take 1 tablet (50 mg total) by mouth every 6 (six) hours as needed for severe pain. 30 tablet 0   Vitamin D, Ergocalciferol, (DRISDOL) 1.25 MG (50000 UNIT) CAPS capsule Take 50,000 Units by mouth once a week.     No current facility-administered medications on file prior to visit.    There are no Patient Instructions on file for this visit. No follow-ups on file.   Kris Hartmann, NP

## 2021-09-29 ENCOUNTER — Ambulatory Visit (INDEPENDENT_AMBULATORY_CARE_PROVIDER_SITE_OTHER): Payer: No Typology Code available for payment source | Admitting: Podiatry

## 2021-09-29 DIAGNOSIS — I999 Unspecified disorder of circulatory system: Secondary | ICD-10-CM | POA: Diagnosis not present

## 2021-09-29 DIAGNOSIS — L97412 Non-pressure chronic ulcer of right heel and midfoot with fat layer exposed: Secondary | ICD-10-CM | POA: Diagnosis not present

## 2021-09-29 DIAGNOSIS — E118 Type 2 diabetes mellitus with unspecified complications: Secondary | ICD-10-CM | POA: Diagnosis not present

## 2021-09-29 DIAGNOSIS — L97422 Non-pressure chronic ulcer of left heel and midfoot with fat layer exposed: Secondary | ICD-10-CM | POA: Diagnosis not present

## 2021-09-29 DIAGNOSIS — Z794 Long term (current) use of insulin: Secondary | ICD-10-CM

## 2021-09-29 MED ORDER — DOXYCYCLINE HYCLATE 100 MG PO TABS: 100.0000 mg | ORAL_TABLET | Freq: Two times a day (BID) | ORAL | 0 refills | Status: AC

## 2021-10-02 ENCOUNTER — Telehealth: Payer: Self-pay | Admitting: *Deleted

## 2021-10-02 NOTE — Telephone Encounter (Signed)
PT w/ Beth Carroll is calling for clarification on limits of walking, is at a visit and helping with her w/ walking today.  Please advise/ last office visit notes.

## 2021-10-05 NOTE — Telephone Encounter (Signed)
Called PT,no answer, left voice message that nwb recommendations per provider for ulcers for patient.

## 2021-10-06 ENCOUNTER — Telehealth: Payer: Self-pay

## 2021-10-06 NOTE — Progress Notes (Signed)
Subjective:  Patient ID: Beth Carroll, female    DOB: 1970/10/06,  MRN: [id number]  Chief Complaint  Patient presents with   Foot Ulcer    51 y.o. female presents for wound care.  Patient presents with bilateral heel wound with fat layer exposed.  Patient's is a diabetic with last A1c of 12.9%.  She states that it came out of nowhere and has been causing her a lot of pain.  She has not seen anyone else prior to seeing me.  She is not taking any antibiotics.  She has not had any circulation studies done.  She would like to discuss treatment options for this.   Review of Systems: Negative except as noted in the HPI. Denies N/V/F/Ch.  Past Medical History:  Diagnosis Date   Coronary artery disease    Diabetes mellitus without complication (Beth Carroll)    Diabetes mellitus, type 2 (Beth Carroll)    HFrEF (heart failure with reduced ejection fraction) (Beth Carroll)    Hyperlipemia    Hypertension    Ischemic cardiomyopathy    Orthostatic hypotension    PAD (peripheral artery disease) (Beth Carroll)    Tobacco use     Current Outpatient Medications:    doxycycline (VIBRA-TABS) 100 MG tablet, Take 1 tablet (100 mg total) by mouth 2 (two) times daily., Disp: 60 tablet, Rfl: 0   albuterol (VENTOLIN HFA) 108 (90 Base) MCG/ACT inhaler, Inhale 2 puffs into the lungs every 6 (six) hours as needed., Disp: , Rfl:    apixaban (ELIQUIS) 5 MG TABS tablet, Take 1 tablet (5 mg total) by mouth 2 (two) times daily., Disp: 60 tablet, Rfl: 3   aspirin EC 81 MG EC tablet, Take 1 tablet (81 mg total) by mouth daily. Swallow whole., Disp: 30 tablet, Rfl: 2   atorvastatin (LIPITOR) 20 MG tablet, Take 1 tablet (20 mg total) by mouth daily., Disp: 30 tablet, Rfl: 0   carvedilol (COREG) 3.125 MG tablet, Take 1 tablet (3.125 mg total) by mouth 2 (two) times daily with a meal., Disp: 60 tablet, Rfl: 0   FARXIGA 10 MG TABS tablet, TAKE 1 TABLET BY MOUTH DAILY BEFORE BREAKFAST., Disp: 30 tablet, Rfl: 3   furosemide (LASIX) 20 MG tablet, Take 20  mg by mouth daily., Disp: , Rfl:    insulin aspart (NOVOLOG) 100 UNIT/ML FlexPen, Inject 6-7 Units into the skin 3 (three) times daily with meals., Disp: , Rfl:    insulin glargine (LANTUS SOLOSTAR) 100 UNIT/ML Solostar Pen, Inject 25 Units into the skin daily., Disp: , Rfl:    losartan (COZAAR) 25 MG tablet, TAKE 1/2 TABLET BY MOUTH EVERY DAY, Disp: 90 tablet, Rfl: 3   pantoprazole (PROTONIX) 40 MG tablet, Take 1 tablet (40 mg total) by mouth daily., Disp: 30 tablet, Rfl: 0   tiotropium (SPIRIVA) 18 MCG inhalation capsule, Place 18 mcg into inhaler and inhale daily., Disp: , Rfl:    traMADol (ULTRAM) 50 MG tablet, Take 1 tablet (50 mg total) by mouth every 6 (six) hours as needed for severe pain., Disp: 30 tablet, Rfl: 0   Vitamin D, Ergocalciferol, (DRISDOL) 1.25 MG (50000 UNIT) CAPS capsule, Take 50,000 Units by mouth once a week., Disp: , Rfl:   Social History   Tobacco Use  Smoking Status Former   Types: Cigarettes   Quit date: 12/09/2020   Years since quitting: 0.8  Smokeless Tobacco Never    No Known Allergies Objective:  There were no vitals filed for this visit. There is no height or weight on  file to calculate BMI. Constitutional Well developed. Well nourished.  Vascular Dorsalis pedis pulses nonpalpable bilaterally. Posterior tibial pulses non palpable bilaterally. Capillary refill normal to all digits.  No cyanosis or clubbing noted. Pedal hair growth normal.  Neurologic Normal speech. Oriented to person, place, and time. Protective sensation absent  Dermatologic Wound Location: Bilateral heel wound with fat layer exposed likely due to pressure.  No purulent drainage noted no eschar formation noted.  No erythema noted. Wound Base: Mixed Granular/Fibrotic Peri-wound: Macerated Exudate: Scant/small amount Serosanguinous exudate Wound Measurements: -See above  Orthopedic: No pain to palpation either foot.   Radiographs: None Assessment:   1. Vascular abnormality    2. Chronic heel ulcer, right, with fat layer exposed (Beth Carroll)   3. Chronic heel ulcer, left, with fat layer exposed (Beth Carroll)   4. Controlled type 2 diabetes mellitus with complication, with long-term current use of insulin (Beth Carroll)    Plan:  Patient was evaluated and treated and all questions answered.  Ulcer bilateral heel wound with fat layer exposed -Debridement as below. -Dressed with Betadine wet-to-dry, DSD. -Continue off-loading with surgical shoe. -Plan was dispensed for skin and soft tissue prophylaxis. -If the wound regresses patient is a high risk for bilateral amputations/major amputation -Given the presence of vascular abnormality with heel wounds I believe patient would benefit from ABIs PVRs to assess the vascular flow followed by vascular consult if indicated  Procedure: Excisional Debridement of Wound right side Tool: Sharp chisel blade/tissue nipper Rationale: Removal of non-viable soft tissue from the wound to promote healing.  Anesthesia: none Pre-Debridement Wound Measurements: 1 cm x 0.8 cm x 0.3 cm  Post-Debridement Wound Measurements: 1.3 cm x 0.9 cm x 0.3 cm  Type of Debridement: Sharp Excisional Tissue Removed: Non-viable soft tissue Blood loss: Minimal (<50cc) Depth of Debridement: subcutaneous tissue. Technique: Sharp excisional debridement to bleeding, viable wound base.  Wound Progress: This is my initial evaluation of continue monitor the progression of the wound Site healing conversation 7 Dressing: Dry, sterile, compression dressing. Disposition: Patient tolerated procedure well. Patient to return in 1 week for follow-up.  Procedure: Excisional Debridement of Wound right side Tool: Sharp chisel blade/tissue nipper Rationale: Removal of non-viable soft tissue from the wound to promote healing.  Anesthesia: none Pre-Debridement Wound Measurements: 1.2 x 0.7 x 0.3 cm Post-Debridement Wound Measurements: 1. 4 cm x 0.8 cm x 0.3 cm Type of Debridement: Sharp  Excisional Tissue Removed: Non-viable soft tissue Blood loss: Minimal (<50cc) Depth of Debridement: subcutaneous tissue. Technique: Sharp excisional debridement to bleeding, viable wound base.  Wound Progress: This is my initial evaluation of continue monitor the progression of the wound Site healing conversation 7 Dressing: Dry, sterile, compression dressing. Disposition: Patient tolerated procedure well. Patient to return in 1 week for follow-up.  No follow-ups on file.

## 2021-10-07 NOTE — Telephone Encounter (Signed)
Message was routed to appropriate staff

## 2021-10-08 ENCOUNTER — Telehealth (INDEPENDENT_AMBULATORY_CARE_PROVIDER_SITE_OTHER): Payer: Self-pay

## 2021-10-08 NOTE — Telephone Encounter (Signed)
Raquel Sarna called concerning pt's PT.  It has been placed on hold right now due to seeing the podiatrist Dr Posey Pronto and she has wounds on her heals and he wants her off of her feet.  Raquel Sarna states that the pt is only working on walking at this time and she has placed her PT on hold until further notice.  If we have any questions we can reach put to her.  [phone]

## 2021-10-08 NOTE — Telephone Encounter (Signed)
That's reasonable

## 2021-10-13 ENCOUNTER — Ambulatory Visit (INDEPENDENT_AMBULATORY_CARE_PROVIDER_SITE_OTHER): Payer: No Typology Code available for payment source | Admitting: Podiatry

## 2021-10-13 DIAGNOSIS — L97422 Non-pressure chronic ulcer of left heel and midfoot with fat layer exposed: Secondary | ICD-10-CM | POA: Diagnosis not present

## 2021-10-13 DIAGNOSIS — Z794 Long term (current) use of insulin: Secondary | ICD-10-CM | POA: Diagnosis not present

## 2021-10-13 DIAGNOSIS — E118 Type 2 diabetes mellitus with unspecified complications: Secondary | ICD-10-CM | POA: Diagnosis not present

## 2021-10-13 DIAGNOSIS — L97412 Non-pressure chronic ulcer of right heel and midfoot with fat layer exposed: Secondary | ICD-10-CM | POA: Diagnosis not present

## 2021-10-16 ENCOUNTER — Encounter (INDEPENDENT_AMBULATORY_CARE_PROVIDER_SITE_OTHER): Payer: No Typology Code available for payment source

## 2021-10-16 ENCOUNTER — Ambulatory Visit (INDEPENDENT_AMBULATORY_CARE_PROVIDER_SITE_OTHER): Payer: No Typology Code available for payment source | Admitting: Vascular Surgery

## 2021-10-16 ENCOUNTER — Other Ambulatory Visit (INDEPENDENT_AMBULATORY_CARE_PROVIDER_SITE_OTHER): Payer: No Typology Code available for payment source

## 2021-10-19 ENCOUNTER — Telehealth: Payer: Self-pay | Admitting: Cardiovascular Disease

## 2021-10-19 NOTE — Telephone Encounter (Signed)
Patient stated her Vein and Vascular doctor recommended she cancel her ABI W/WO TBI test on 10/5 at 1pm as she has another test scheduled on 10/13.

## 2021-10-19 NOTE — Telephone Encounter (Signed)
Spoke with the patient and cancelled her appointment for ABI's on 10/5. She is scheduled for test on 10/13 along with follow up at vein and vascular clinic.

## 2021-10-20 NOTE — Progress Notes (Signed)
Subjective:  Patient ID: Beth Carroll, female    DOB: [birth year],  MRN: [id number]  Chief Complaint  Patient presents with   Wound Check    51 y.o. female presents for wound care.  Patient presents with bilateral heel wound with fat layer exposed.  Patient's is a diabetic with last A1c of 12.9%.  She states that it came out of nowhere and has been causing her a lot of pain.  She has not seen anyone else prior to seeing me.  She is not taking any antibiotics.  She has not had any circulation studies done.  She would like to discuss treatment options for this.   Review of Systems: Negative except as noted in the HPI. Denies N/V/F/Ch.  Past Medical History:  Diagnosis Date   Coronary artery disease    Diabetes mellitus without complication ([place])    Diabetes mellitus, type 2 ([facility])    HFrEF (heart failure with reduced ejection fraction) ([facility])    Hyperlipemia    Hypertension    Ischemic cardiomyopathy    Orthostatic hypotension    PAD (peripheral artery disease) ([facility])    Tobacco use     Current Outpatient Medications:    albuterol (VENTOLIN HFA) 108 (90 Base) MCG/ACT inhaler, Inhale 2 puffs into the lungs every 6 (six) hours as needed., Disp: , Rfl:    apixaban (ELIQUIS) 5 MG TABS tablet, Take 1 tablet (5 mg total) by mouth 2 (two) times daily., Disp: 60 tablet, Rfl: 3   aspirin EC 81 MG EC tablet, Take 1 tablet (81 mg total) by mouth daily. Swallow whole., Disp: 30 tablet, Rfl: 2   atorvastatin (LIPITOR) 20 MG tablet, Take 1 tablet (20 mg total) by mouth daily., Disp: 30 tablet, Rfl: 0   carvedilol (COREG) 3.125 MG tablet, Take 1 tablet (3.125 mg total) by mouth 2 (two) times daily with a meal., Disp: 60 tablet, Rfl: 0   doxycycline (VIBRA-TABS) 100 MG tablet, Take 1 tablet (100 mg total) by mouth 2 (two) times daily., Disp: 60 tablet, Rfl: 0   FARXIGA 10 MG TABS tablet, TAKE 1 TABLET BY MOUTH DAILY BEFORE BREAKFAST., Disp: 30 tablet, Rfl: 3   furosemide (LASIX) 20 MG tablet, Take 20  mg by mouth daily., Disp: , Rfl:    insulin aspart (NOVOLOG) 100 UNIT/ML FlexPen, Inject 6-7 Units into the skin 3 (three) times daily with meals., Disp: , Rfl:    insulin glargine (LANTUS SOLOSTAR) 100 UNIT/ML Solostar Pen, Inject 25 Units into the skin daily., Disp: , Rfl:    losartan (COZAAR) 25 MG tablet, TAKE 1/2 TABLET BY MOUTH EVERY DAY, Disp: 90 tablet, Rfl: 3   pantoprazole (PROTONIX) 40 MG tablet, Take 1 tablet (40 mg total) by mouth daily., Disp: 30 tablet, Rfl: 0   tiotropium (SPIRIVA) 18 MCG inhalation capsule, Place 18 mcg into inhaler and inhale daily., Disp: , Rfl:    traMADol (ULTRAM) 50 MG tablet, Take 1 tablet (50 mg total) by mouth every 6 (six) hours as needed for severe pain., Disp: 30 tablet, Rfl: 0   Vitamin D, Ergocalciferol, (DRISDOL) 1.25 MG (50000 UNIT) CAPS capsule, Take 50,000 Units by mouth once a week., Disp: , Rfl:   Social History   Tobacco Use  Smoking Status Former   Types: Cigarettes   Quit date: 12/09/2020   Years since quitting: 0.8  Smokeless Tobacco Never    No Known Allergies Objective:  There were no vitals filed for this visit. There is no height or weight on  file to calculate BMI. Constitutional Well developed. Well nourished.  Vascular Dorsalis pedis pulses nonpalpable bilaterally. Posterior tibial pulses non palpable bilaterally. Capillary refill normal to all digits.  No cyanosis or clubbing noted. Pedal hair growth normal.  Neurologic Normal speech. Oriented to person, place, and time. Protective sensation absent  Dermatologic Wound Location: Bilateral heel wound with fat layer exposed likely due to pressure.  No purulent drainage noted no eschar formation noted.  No erythema noted. Wound Base: Mixed Granular/Fibrotic Peri-wound: Macerated Exudate: Scant/small amount Serosanguinous exudate Wound Measurements: -See above  Orthopedic: No pain to palpation either foot.   Radiographs: None Assessment:   1. Chronic heel ulcer,  right, with fat layer exposed ([place])   2. Chronic heel ulcer, left, with fat layer exposed ([place])   3. Controlled type 2 diabetes mellitus with complication, with long-term current use of insulin ([place])     Plan:  Patient was evaluated and treated and all questions answered.  Ulcer bilateral heel wound with fat layer exposed -Debridement as below. -Dressed with Betadine wet-to-dry, DSD. -Continue off-loading with surgical shoe. -Plan was dispensed for skin and soft tissue prophylaxis. -If the wound regresses patient is a high risk for bilateral amputations/major amputation -ABIs PVRs were reviewed which shows normal flow to the foot.  Procedure: Excisional Debridement of Wound right side~stagnant Tool: Sharp chisel blade/tissue nipper Rationale: Removal of non-viable soft tissue from the wound to promote healing.  Anesthesia: none Pre-Debridement Wound Measurements: 1 cm x 0.8 cm x 0.3 cm  Post-Debridement Wound Measurements: 1.3 cm x 0.9 cm x 0.3 cm  Type of Debridement: Sharp Excisional Tissue Removed: Non-viable soft tissue Blood loss: Minimal (<50cc) Depth of Debridement: subcutaneous tissue. Technique: Sharp excisional debridement to bleeding, viable wound base.  Wound Progress: T wound is stable however not decreasing Dressing: Dry, sterile, compression dressing. Disposition: Patient tolerated procedure well. Patient to return in 1 week for follow-up.  Procedure: Excisional Debridement of Wound right side~stagnant Tool: Sharp chisel blade/tissue nipper Rationale: Removal of non-viable soft tissue from the wound to promote healing.  Anesthesia: none Pre-Debridement Wound Measurements: 1.2 x 0.7 x 0.3 cm Post-Debridement Wound Measurements: 1. 4 cm x 0.8 cm x 0.3 cm Type of Debridement: Sharp Excisional Tissue Removed: Non-viable soft tissue Blood loss: Minimal (<50cc) Depth of Debridement: subcutaneous tissue. Technique: Sharp excisional debridement to bleeding, viable  wound base.  Wound Progress: Wound is stable however not decreasing Dressing: Dry, sterile, compression dressing. Disposition: Patient tolerated procedure well. Patient to return in 1 week for follow-up.  No follow-ups on file.

## 2021-10-21 ENCOUNTER — Ambulatory Visit: Payer: No Typology Code available for payment source | Admitting: Cardiovascular Disease

## 2021-10-27 ENCOUNTER — Other Ambulatory Visit: Payer: Self-pay | Admitting: Podiatry

## 2021-10-28 ENCOUNTER — Ambulatory Visit: Admit: 2021-10-28 | Payer: No Typology Code available for payment source | Admitting: Internal Medicine

## 2021-10-28 SURGERY — COLONOSCOPY WITH PROPOFOL
Anesthesia: General

## 2021-10-30 ENCOUNTER — Ambulatory Visit (INDEPENDENT_AMBULATORY_CARE_PROVIDER_SITE_OTHER): Payer: No Typology Code available for payment source | Admitting: Nurse Practitioner

## 2021-10-30 ENCOUNTER — Encounter (INDEPENDENT_AMBULATORY_CARE_PROVIDER_SITE_OTHER): Payer: No Typology Code available for payment source

## 2021-11-04 ENCOUNTER — Other Ambulatory Visit (INDEPENDENT_AMBULATORY_CARE_PROVIDER_SITE_OTHER): Payer: Self-pay | Admitting: Vascular Surgery

## 2021-11-04 DIAGNOSIS — I739 Peripheral vascular disease, unspecified: Secondary | ICD-10-CM

## 2021-11-06 ENCOUNTER — Ambulatory Visit (INDEPENDENT_AMBULATORY_CARE_PROVIDER_SITE_OTHER): Payer: No Typology Code available for payment source

## 2021-11-06 ENCOUNTER — Ambulatory Visit (INDEPENDENT_AMBULATORY_CARE_PROVIDER_SITE_OTHER): Payer: No Typology Code available for payment source | Admitting: Nurse Practitioner

## 2021-11-06 ENCOUNTER — Encounter (INDEPENDENT_AMBULATORY_CARE_PROVIDER_SITE_OTHER): Payer: Self-pay | Admitting: Nurse Practitioner

## 2021-11-06 VITALS — BP 148/85 | HR 80 | Resp 19

## 2021-11-06 DIAGNOSIS — I739 Peripheral vascular disease, unspecified: Secondary | ICD-10-CM

## 2021-11-06 DIAGNOSIS — E119 Type 2 diabetes mellitus without complications: Secondary | ICD-10-CM | POA: Diagnosis not present

## 2021-11-06 DIAGNOSIS — Z9889 Other specified postprocedural states: Secondary | ICD-10-CM

## 2021-11-06 DIAGNOSIS — I10 Essential (primary) hypertension: Secondary | ICD-10-CM

## 2021-11-07 ENCOUNTER — Encounter (INDEPENDENT_AMBULATORY_CARE_PROVIDER_SITE_OTHER): Payer: Self-pay | Admitting: Nurse Practitioner

## 2021-11-07 NOTE — Progress Notes (Signed)
Subjective:    Patient ID: Beth Carroll, female    DOB: Oct 21, 1970, 51 y.o.   MRN: [id number] No chief complaint on file.   Beth Carroll is a 51 year old female who returns today for follow-up of noninvasive studies.  The patient previously had bilateral wounds on her heels.  She reports that these wounds are healing very well.  What heal is essentially healed and what is very close to being healed.  She has been receiving excellent wound care from podiatry.  She notes that she is also regaining some of the feeling in her feet as well.  She denies any significant claudication or rest pain like symptoms.  Today noninvasive studies show an ABI of 1.12 on the right and 1.06 on the left.  The patient has patent stents bilaterally.  With biphasic monophasic waveforms in the right.  Primarily biphasic/triphasic waveforms in the left both monophasic waveforms at the distal tibial vessels.    Review of Systems  Musculoskeletal:  Positive for gait problem.  Skin:  Positive for wound.  All other systems reviewed and are negative.      Objective:   Physical Exam Vitals reviewed.  HENT:     Head: Normocephalic.  Cardiovascular:     Rate and Rhythm: Normal rate.     Pulses:          Dorsalis pedis pulses are detected w/ Doppler on the right side and detected w/ Doppler on the left side.       Posterior tibial pulses are detected w/ Doppler on the right side and detected w/ Doppler on the left side.  Pulmonary:     Effort: Pulmonary effort is normal.  Skin:    General: Skin is warm and dry.  Neurological:     Mental Status: She is alert and oriented to person, place, and time.  Psychiatric:        Mood and Affect: Mood normal.        Behavior: Behavior normal.        Thought Content: Thought content normal.        Judgment: Judgment normal.     BP (!) 148/85 (BP Location: Right Arm)   Pulse 80   Resp 19   Past Medical History:  Diagnosis Date   Coronary artery disease     Diabetes mellitus without complication (HCC)    Diabetes mellitus, type 2 (HCC)    HFrEF (heart failure with reduced ejection fraction) (HCC)    Hyperlipemia    Hypertension    Ischemic cardiomyopathy    Orthostatic hypotension    PAD (peripheral artery disease) (HCC)    Tobacco use     Social History   Socioeconomic History   Marital status: Single    Spouse name: Not on file   Number of children: Not on file   Years of education: Not on file   Highest education level: Not on file  Occupational History   Not on file  Tobacco Use   Smoking status: Former    Types: Cigarettes    Quit date: 12/09/2020    Years since quitting: 0.9   Smokeless tobacco: Never  Vaping Use   Vaping Use: Never used  Substance and Sexual Activity   Alcohol use: Never   Drug use: Not on file   Sexual activity: Not on file  Other Topics Concern   Not on file  Social History Narrative   Not on file   Social Determinants of Health   Financial  Resource Strain: Not on file  Food Insecurity: Not on file  Transportation Needs: Not on file  Physical Activity: Not on file  Stress: Not on file  Social Connections: Not on file  Intimate Partner Violence: Not on file    Past Surgical History:  Procedure Laterality Date   CORONARY STENT INTERVENTION N/A 12/15/2020   Procedure: CORONARY STENT INTERVENTION;  Surgeon: Wellington [facility], MD;  Location: [facility] [place] [facility];  Service: Cardiovascular;  Laterality: N/A;   LEFT HEART CATH AND CORONARY ANGIOGRAPHY N/A 12/15/2020   Procedure: LEFT HEART CATH AND CORONARY ANGIOGRAPHY;  Surgeon: Wellington [facility], MD;  Location: [place] [facility];  Service: Cardiovascular;  Laterality: N/A;   LOWER EXTREMITY ANGIOGRAPHY Left 12/10/2020   Procedure: Lower Extremity Angiography;  Surgeon: Algernon Huxley, MD;  Location: [facility];  Service: Cardiovascular;  Laterality: Left;   LOWER EXTREMITY ANGIOGRAPHY Right 12/25/2020   Procedure: LOWER EXTREMITY  ANGIOGRAPHY;  Surgeon: Algernon Huxley, MD;  Location: [facility];  Service: Cardiovascular;  Laterality: Right;   LOWER EXTREMITY ANGIOGRAPHY Right 08/19/2021   Procedure: Lower Extremity Angiography;  Surgeon: Algernon Huxley, MD;  Location: [facility];  Service: Cardiovascular;  Laterality: Right;   LOWER EXTREMITY ANGIOGRAPHY Right 08/19/2021   Procedure: Lower Extremity Angiography;  Surgeon: Algernon Huxley, MD;  Location: [place] [facility];  Service: Cardiovascular;  Laterality: Right;    Family History  Problem Relation Age of Onset   Hypertension Father     No Known Allergies     Latest Ref Rng & Units 08/23/2021    6:04 AM 08/22/2021    8:11 AM 08/21/2021    4:44 AM  CBC  WBC 4.0 - 10.5 K/uL 5.3  8.4  12.9   Hemoglobin 12.0 - 15.0 g/dL 10.9  11.0  11.5   Hematocrit 36.0 - 46.0 % 33.6  34.0  36.3   Platelets 150 - 400 K/uL 139  147  165       CMP     Component Value Date/Time   NA 138 08/23/2021 0604   NA 137 03/18/2021 1541   K 3.1 (L) 08/23/2021 0604   CL 110 08/23/2021 0604   CO2 23 08/23/2021 0604   GLUCOSE 162 (H) 08/23/2021 0604   BUN 17 08/23/2021 0604   BUN 15 03/18/2021 1541   CREATININE 0.39 (L) 08/23/2021 0604   CALCIUM 8.7 (L) 08/23/2021 0604   PROT 7.4 08/19/2021 2107   ALBUMIN 4.0 08/19/2021 2107   AST 22 08/19/2021 2107   ALT 14 08/19/2021 2107   ALKPHOS 75 08/19/2021 2107   BILITOT 1.7 (H) 08/19/2021 2107   GFRNONAA >60 08/23/2021 0604     VAS [place] ABI WITH/WO TBI  Result Date: 09/21/2021  LOWER EXTREMITY DOPPLER STUDY Patient Name:  Beth Carroll  Date of Exam:   09/18/2021 Medical Rec #: [id number]      Accession #:    [id number] Date of Birth: 12-13-1970      Patient Gender: F Patient Age:   31 years Exam Location:  [facility] Procedure:      VAS [place] ABI WITH/WO TBI Referring Phys: Leotis Pain --------------------------------------------------------------------------------  Indications: Claudication, rest pain, and  peripheral artery disease. High Risk Factors: Hypertension, coronary artery disease.  Vascular Interventions: 12/25/2020 right SFA DCPTA and stent  08/19/2021: Aortogram and Selective Right Lower                         Extremity Angiogram. Catheter directed thrombolytic                         therapy with a total of 12 mg of TPA to the Right SFA,                         Popliteal Artery Tib/Peroneal Trunk and Proximal                         Posterior Tibial Artery and placement of a 135 cm total                         length 50 cm working length catheter for continuous                         thrombolytic therapy. Mechanical Thrombectomy to the                         Right SFA and Popliteal Artery with the [place] device.                         PTA of the Right SFA and Popliteal Artery with 2                         inflations with a 5 mm diameter by 30 cm length Lutonix                         drug coated angioplasty balloon.                          08/19/2021: Right Lower Extremity Angiogram. Mechanical                         Thrombectomy of the Right SFA, Popliteal Artery,                         Tibioperoneal Trunk, Proximal Posterior Tibial Artery                         using the penumbra CAT 6 device. Stent placement to the                         Right Popliteal Artery wit 5 mm diameter by 7.5 cm                         length Viabahn stent. PTA of the Right Posterior Tibial                         Artery with 2.5 mm diameter by 22 cm length angioplasty                         balloon. Stent placement to the Right SFA with a 6 mm  diameter by 2.5 cm length Viabahn stent. Comparison Study: 08/17/2021 Performing Technologist: Almira Coaster RVS  Examination Guidelines: A complete evaluation includes at minimum, Doppler waveform signals and systolic blood pressure reading at the level of bilateral brachial, anterior tibial, and posterior tibial arteries,  when vessel segments are accessible. Bilateral testing is considered an integral part of a complete examination. Photoelectric Plethysmograph (PPG) waveforms and toe systolic pressure readings are included as required and additional duplex testing as needed. Limited examinations for reoccurring indications may be performed as noted.  ABI Findings: +---------+------------------+-----+---------+--------+ Right    Rt Pressure (mmHg)IndexWaveform Comment  +---------+------------------+-----+---------+--------+ Brachial 155                                      +---------+------------------+-----+---------+--------+ ATA      169               1.09 triphasic         +---------+------------------+-----+---------+--------+ PTA      170               1.10 triphasic         +---------+------------------+-----+---------+--------+ Great Toe175               1.13 Normal            +---------+------------------+-----+---------+--------+ +---------+------------------+-----+---------+-------+ Left     Lt Pressure (mmHg)IndexWaveform Comment +---------+------------------+-----+---------+-------+ Brachial 151                                     +---------+------------------+-----+---------+-------+ ATA      134               0.86 biphasic         +---------+------------------+-----+---------+-------+ PTA      155               1.00 triphasic        +---------+------------------+-----+---------+-------+ Great Toe163               1.05 Normal           +---------+------------------+-----+---------+-------+ +-------+-----------+-----------+------------+------------+ ABI/TBIToday's ABIToday's TBIPrevious ABIPrevious TBI +-------+-----------+-----------+------------+------------+ Right  1.10       1.13       .26         0            +-------+-----------+-----------+------------+------------+ Left   1.0        1.05       .95         .60           +-------+-----------+-----------+------------+------------+ Bilateral TBIs appear increased compared to prior study on 08/17/2021. Right ABIs appear increased compared to prior study on 08/17/2021. Left ABIs appear to be essentially unchanged compared to prior study on 08/17/2021.  Summary: Right: Resting right ankle-brachial index is within normal range. The right toe-brachial index is normal. Left: Resting left ankle-brachial index is within normal range. The left toe-brachial index is normal. *See table(s) above for measurements and observations.  Electronically signed by Leotis Pain MD on 09/21/2021 at 9:05:14 AM.    Final        Assessment & Plan:   1. Peripheral arterial disease with history of revascularization (HCC)  Recommend:  The patient has evidence of atherosclerosis of the lower extremities with minimal claudication.  The patient does not voice lifestyle limiting changes at this  point in time.  The patient's wounds are healing very well.  Noninvasive studies do not suggest clinically significant change.  No invasive studies, angiography or surgery at this time The patient should continue walking and begin a more formal exercise program.  The patient should continue antiplatelet therapy and aggressive treatment of the lipid abnormalities  No changes in the patient's medications at this time  Continued surveillance is indicated as atherosclerosis is likely to progress with time.    The patient will continue follow up with noninvasive studies as ordered.  Patient will follow-up in 3 months.  2. Diabetes mellitus without complication ([place]) Continue hypoglycemic medications as already ordered, these medications have been reviewed and there are no changes at this time.  Hgb A1C to be monitored as already arranged by primary service   3. Primary hypertension Continue antihypertensive medications as already ordered, these medications have been reviewed and there are no changes at this  time.    Current Outpatient Medications on File Prior to Visit  Medication Sig Dispense Refill   albuterol (VENTOLIN HFA) 108 (90 Base) MCG/ACT inhaler Inhale 2 puffs into the lungs every 6 (six) hours as needed.     apixaban (ELIQUIS) 5 MG TABS tablet Take 1 tablet (5 mg total) by mouth 2 (two) times daily. 60 tablet 3   aspirin EC 81 MG EC tablet Take 1 tablet (81 mg total) by mouth daily. Swallow whole. 30 tablet 2   atorvastatin (LIPITOR) 20 MG tablet Take 1 tablet (20 mg total) by mouth daily. 30 tablet 0   carvedilol (COREG) 3.125 MG tablet Take 1 tablet (3.125 mg total) by mouth 2 (two) times daily with a meal. 60 tablet 0   FARXIGA 10 MG TABS tablet TAKE 1 TABLET BY MOUTH DAILY BEFORE BREAKFAST. 30 tablet 3   furosemide (LASIX) 20 MG tablet Take 20 mg by mouth daily.     hydrochlorothiazide (HYDRODIURIL) 25 MG tablet Take 25 mg by mouth daily.     insulin aspart (NOVOLOG) 100 UNIT/ML FlexPen Inject 6-7 Units into the skin 3 (three) times daily with meals.     insulin glargine (LANTUS SOLOSTAR) 100 UNIT/ML Solostar Pen Inject 25 Units into the skin daily.     losartan (COZAAR) 25 MG tablet TAKE 1/2 TABLET BY MOUTH EVERY DAY 90 tablet 3   pantoprazole (PROTONIX) 40 MG tablet Take 1 tablet (40 mg total) by mouth daily. 30 tablet 0   Semaglutide,0.25 or 0.'5MG'$ /DOS, 2 MG/3ML SOPN Inject into the skin.     tiotropium (SPIRIVA) 18 MCG inhalation capsule Place 18 mcg into inhaler and inhale daily.     traMADol (ULTRAM) 50 MG tablet Take 1 tablet (50 mg total) by mouth every 6 (six) hours as needed for severe pain. 30 tablet 0   Vitamin D, Ergocalciferol, (DRISDOL) 1.25 MG (50000 UNIT) CAPS capsule Take 50,000 Units by mouth once a week.     No current facility-administered medications on file prior to visit.    There are no Patient Instructions on file for this visit. No follow-ups on file.   Kris Hartmann, NP

## 2021-11-10 ENCOUNTER — Ambulatory Visit (INDEPENDENT_AMBULATORY_CARE_PROVIDER_SITE_OTHER): Payer: No Typology Code available for payment source | Admitting: Podiatry

## 2021-11-10 ENCOUNTER — Other Ambulatory Visit: Payer: Self-pay | Admitting: Medical

## 2021-11-10 DIAGNOSIS — L97412 Non-pressure chronic ulcer of right heel and midfoot with fat layer exposed: Secondary | ICD-10-CM

## 2021-11-10 DIAGNOSIS — Z794 Long term (current) use of insulin: Secondary | ICD-10-CM | POA: Diagnosis not present

## 2021-11-10 DIAGNOSIS — E118 Type 2 diabetes mellitus with unspecified complications: Secondary | ICD-10-CM | POA: Diagnosis not present

## 2021-11-17 NOTE — Progress Notes (Signed)
Subjective:  Patient ID: Beth Carroll, female    DOB: 04-17-1970,  MRN: [id number]  Chief Complaint  Patient presents with   Foot Ulcer    51 y.o. female presents for wound care.  Presents with follow-up of bilateral heel ulceration the left side has completely healed the right side is still there.  She is still a diabetic with last A1c of 12.9%.  She wanted discuss next treatment plan to be new Betadine wet-to-dry dressing  Review of Systems: Negative except as noted in the HPI. Denies N/V/F/Ch.  Past Medical History:  Diagnosis Date   Coronary artery disease    Diabetes mellitus without complication ([place])    Diabetes mellitus, type 2 (HCC)    HFrEF (heart failure with reduced ejection fraction) (HCC)    Hyperlipemia    Hypertension    Ischemic cardiomyopathy    Orthostatic hypotension    PAD (peripheral artery disease) (HCC)    Tobacco use     Current Outpatient Medications:    albuterol (VENTOLIN HFA) 108 (90 Base) MCG/ACT inhaler, Inhale 2 puffs into the lungs every 6 (six) hours as needed., Disp: , Rfl:    apixaban (ELIQUIS) 5 MG TABS tablet, Take 1 tablet (5 mg total) by mouth 2 (two) times daily., Disp: 60 tablet, Rfl: 3   aspirin EC 81 MG EC tablet, Take 1 tablet (81 mg total) by mouth daily. Swallow whole., Disp: 30 tablet, Rfl: 2   atorvastatin (LIPITOR) 20 MG tablet, Take 1 tablet (20 mg total) by mouth daily., Disp: 30 tablet, Rfl: 0   carvedilol (COREG) 3.125 MG tablet, Take 1 tablet (3.125 mg total) by mouth 2 (two) times daily with a meal., Disp: 60 tablet, Rfl: 0   FARXIGA 10 MG TABS tablet, TAKE 1 TABLET BY MOUTH EVERY DAY BEFORE BREAKFAST, Disp: 30 tablet, Rfl: 3   furosemide (LASIX) 20 MG tablet, Take 20 mg by mouth daily., Disp: , Rfl:    hydrochlorothiazide (HYDRODIURIL) 25 MG tablet, Take 25 mg by mouth daily., Disp: , Rfl:    insulin aspart (NOVOLOG) 100 UNIT/ML FlexPen, Inject 6-7 Units into the skin 3 (three) times daily with meals., Disp: , Rfl:     insulin glargine (LANTUS SOLOSTAR) 100 UNIT/ML Solostar Pen, Inject 25 Units into the skin daily., Disp: , Rfl:    losartan (COZAAR) 25 MG tablet, TAKE 1/2 TABLET BY MOUTH EVERY DAY, Disp: 90 tablet, Rfl: 3   pantoprazole (PROTONIX) 40 MG tablet, Take 1 tablet (40 mg total) by mouth daily., Disp: 30 tablet, Rfl: 0   Semaglutide,0.25 or 0.'5MG'$ /DOS, 2 MG/3ML SOPN, Inject into the skin., Disp: , Rfl:    tiotropium (SPIRIVA) 18 MCG inhalation capsule, Place 18 mcg into inhaler and inhale daily., Disp: , Rfl:    traMADol (ULTRAM) 50 MG tablet, Take 1 tablet (50 mg total) by mouth every 6 (six) hours as needed for severe pain., Disp: 30 tablet, Rfl: 0   Vitamin D, Ergocalciferol, (DRISDOL) 1.25 MG (50000 UNIT) CAPS capsule, Take 50,000 Units by mouth once a week., Disp: , Rfl:   Social History   Tobacco Use  Smoking Status Former   Types: Cigarettes   Quit date: 12/09/2020   Years since quitting: 0.9  Smokeless Tobacco Never    No Known Allergies Objective:  There were no vitals filed for this visit. There is no height or weight on file to calculate BMI. Constitutional Well developed. Well nourished.  Vascular Dorsalis pedis pulses nonpalpable bilaterally. Posterior tibial pulses non palpable bilaterally. Capillary  refill normal to all digits.  No cyanosis or clubbing noted. Pedal hair growth normal.  Neurologic Normal speech. Oriented to person, place, and time. Protective sensation absent  Dermatologic Wound Location: Right heel wound with fat layer exposed likely due to pressure.  No purulent drainage noted no eschar formation noted.  No erythema noted. Wound Base: Mixed Granular/Fibrotic Peri-wound: Macerated Exudate: Scant/small amount Serosanguinous exudate Wound Measurements: -See above  Orthopedic: No pain to palpation either foot.   Radiographs: None Assessment:   No diagnosis found.   Plan:  Patient was evaluated and treated and all questions answered.  Left heel  ulceration -Clinically healed  Ulcer left side heel wound with fat layer exposed -Debridement as below. -Dressed with Betadine wet-to-dry, DSD. -Continue off-loading with surgical shoe. -Plan was dispensed for skin and soft tissue prophylaxis. -If the wound regresses patient is a high risk for bilateral amputations/major amputation -ABIs PVRs were reviewed which shows normal flow to the foot.   Procedure: Excisional Debridement of Wound right side~stagnant Tool: Sharp chisel blade/tissue nipper Rationale: Removal of non-viable soft tissue from the wound to promote healing.  Anesthesia: none Pre-Debridement Wound Measurements: 1.2 x 0.7 x 0.3 cm Post-Debridement Wound Measurements: 1. 4 cm x 0.8 cm x 0.3 cm Type of Debridement: Sharp Excisional Tissue Removed: Non-viable soft tissue Blood loss: Minimal (<50cc) Depth of Debridement: subcutaneous tissue. Technique: Sharp excisional debridement to bleeding, viable wound base.  Wound Progress: Wound is stable however not decreasing Dressing: Dry, sterile, compression dressing. Disposition: Patient tolerated procedure well. Patient to return in 1 week for follow-up.  No follow-ups on file.

## 2021-11-23 ENCOUNTER — Encounter (INDEPENDENT_AMBULATORY_CARE_PROVIDER_SITE_OTHER): Payer: Self-pay

## 2021-12-08 ENCOUNTER — Other Ambulatory Visit: Payer: Self-pay

## 2021-12-08 ENCOUNTER — Ambulatory Visit: Payer: No Typology Code available for payment source | Admitting: Podiatry

## 2021-12-08 ENCOUNTER — Emergency Department: Payer: Self-pay

## 2021-12-08 ENCOUNTER — Inpatient Hospital Stay
Admission: EM | Admit: 2021-12-08 | Discharge: 2021-12-09 | DRG: 534 | Disposition: A | Discharge status: Other Acute Inpatient Hospital | Payer: Self-pay | Attending: Family Medicine | Admitting: Family Medicine

## 2021-12-08 ENCOUNTER — Encounter (HOSPITAL_COMMUNITY): Payer: Self-pay

## 2021-12-08 ENCOUNTER — Encounter: Payer: Self-pay | Admitting: Emergency Medicine

## 2021-12-08 DIAGNOSIS — E119 Type 2 diabetes mellitus without complications: Secondary | ICD-10-CM | POA: Diagnosis present

## 2021-12-08 DIAGNOSIS — I509 Heart failure, unspecified: Secondary | ICD-10-CM | POA: Diagnosis present

## 2021-12-08 DIAGNOSIS — S72413A Displaced unspecified condyle fracture of lower end of unspecified femur, initial encounter for closed fracture: Secondary | ICD-10-CM | POA: Diagnosis present

## 2021-12-08 DIAGNOSIS — S72001A Fracture of unspecified part of neck of right femur, initial encounter for closed fracture: Secondary | ICD-10-CM | POA: Diagnosis present

## 2021-12-08 DIAGNOSIS — S72421A Displaced fracture of lateral condyle of right femur, initial encounter for closed fracture: Principal | ICD-10-CM | POA: Diagnosis present

## 2021-12-08 DIAGNOSIS — N3000 Acute cystitis without hematuria: Secondary | ICD-10-CM

## 2021-12-08 DIAGNOSIS — I2510 Atherosclerotic heart disease of native coronary artery without angina pectoris: Secondary | ICD-10-CM | POA: Diagnosis present

## 2021-12-08 LAB — CBC
HCT: 37 % (ref 36.0–46.0)
Hemoglobin: 12 g/dL (ref 12.0–15.0)
MCH: 27.3 pg (ref 26.0–34.0)
MCHC: 32.4 g/dL (ref 30.0–36.0)
MCV: 84.1 fL (ref 80.0–100.0)
Platelets: 304 10*3/uL (ref 150–400)
RBC: 4.4 MIL/uL (ref 3.87–5.11)
RDW: 14.5 % (ref 11.5–15.5)
WBC: 8.4 10*3/uL (ref 4.0–10.5)
nRBC: 0 % (ref 0.0–0.2)

## 2021-12-08 LAB — BASIC METABOLIC PANEL
Anion gap: 21 — ABNORMAL HIGH (ref 5–15)
BUN: 22 mg/dL — ABNORMAL HIGH (ref 6–20)
CO2: 7 mmol/L — ABNORMAL LOW (ref 22–32)
Calcium: 9.7 mg/dL (ref 8.9–10.3)
Chloride: 109 mmol/L (ref 98–111)
Creatinine, Ser: 0.74 mg/dL (ref 0.44–1.00)
GFR, Estimated: >60 mL/min (ref >60)
Glucose, Bld: 209 mg/dL — ABNORMAL HIGH (ref 70–99)
Potassium: 3 mmol/L — ABNORMAL LOW (ref 3.5–5.1)
Sodium: 137 mmol/L (ref 135–145)

## 2021-12-08 LAB — URINALYSIS, ROUTINE W REFLEX MICROSCOPIC
Bilirubin Urine: NEGATIVE
Glucose, UA: >=500 mg/dL — AB
Ketones, ur: 80 mg/dL — AB
Nitrite: NEGATIVE
Protein, ur: 100 mg/dL — AB
RBC / HPF: >50 RBC/hpf — ABNORMAL HIGH (ref 0–5)
Specific Gravity, Urine: 1.017 (ref 1.005–1.030)
WBC, UA: >50 WBC/hpf — ABNORMAL HIGH (ref 0–5)
pH: 6 (ref 5.0–8.0)

## 2021-12-08 LAB — PREGNANCY, URINE: Preg Test, Ur: NEGATIVE

## 2021-12-08 LAB — MAGNESIUM: Magnesium: 2.4 mg/dL (ref 1.7–2.4)

## 2021-12-08 MED ORDER — SODIUM CHLORIDE 0.9 % IV SOLN
1.0000 g | INTRAVENOUS | Status: DC
  Administered 2021-12-09: 1 g via INTRAVENOUS
  Filled 2021-12-08: qty 10

## 2021-12-08 MED ORDER — OXYCODONE-ACETAMINOPHEN 5-325 MG PO TABS
1.0000 | ORAL_TABLET | ORAL | Status: DC | PRN
  Administered 2021-12-08 – 2021-12-09 (×3): 1 via ORAL
  Filled 2021-12-08 (×3): qty 1

## 2021-12-08 MED ORDER — POTASSIUM CHLORIDE CRYS ER 20 MEQ PO TBCR
40.0000 meq | EXTENDED_RELEASE_TABLET | Freq: Once | ORAL | Status: AC
  Administered 2021-12-08: 40 meq via ORAL
  Filled 2021-12-08: qty 2

## 2021-12-08 MED ORDER — SODIUM CHLORIDE 0.9 % IV SOLN
1.0000 g | Freq: Once | INTRAVENOUS | Status: AC
  Administered 2021-12-08: 1 g via INTRAVENOUS
  Filled 2021-12-08: qty 10

## 2021-12-08 MED ORDER — MAGNESIUM SULFATE 2 GM/50ML IV SOLN
2.0000 g | Freq: Once | INTRAVENOUS | Status: AC
  Administered 2021-12-08: 2 g via INTRAVENOUS
  Filled 2021-12-08: qty 50

## 2021-12-08 MED ORDER — SODIUM CHLORIDE 0.9 % IV BOLUS
500.0000 mL | Freq: Once | INTRAVENOUS | Status: AC
  Administered 2021-12-08: 500 mL via INTRAVENOUS

## 2021-12-08 MED ORDER — FENTANYL CITRATE PF 50 MCG/ML IJ SOSY
50.0000 ug | PREFILLED_SYRINGE | INTRAMUSCULAR | Status: DC | PRN
  Administered 2021-12-08 – 2021-12-09 (×8): 50 ug via INTRAVENOUS
  Filled 2021-12-08 (×8): qty 1

## 2021-12-08 NOTE — ED Provider Notes (Signed)
-----------------------------------------   4:10 PM on 12/08/2021 -----------------------------------------  Blood pressure 116/66, pulse 81, temperature 97.7 F (36.5 C), temperature source Oral, resp. rate 15, SpO2 100 %.  Assuming care from Dr. Quentin Cornwall.  In short, Beth Carroll is a 51 y.o. female with a chief complaint of Fall .  Refer to the original H&P for additional details.  The current plan of care is to follow-up discussion with ortho trauma regarding transfer for lateral femoral condyle fracture.  ----------------------------------------- 9:23 PM on 12/08/2021 ----------------------------------------- After further discussion with Dr. Alvan Dame of orthopedics, Dr. Doreatha Martin of orthopedic surgery will be available later in the week for operative repair.  Case discussed with Dr. Sidney Ace of hospitalist service and patient accepted for transfer.    Blake Divine, MD 12/08/21 2124

## 2021-12-08 NOTE — ED Notes (Signed)
Called spoke to [facility] for ortho trauma at [facility] for transfer  [phone]

## 2021-12-08 NOTE — ED Notes (Signed)
PT to xray with transport

## 2021-12-08 NOTE — ED Triage Notes (Signed)
Ems report: pt ems from home s/p fall on Wednesday with right knee pain. Per ems, pt has not been ambulatory since wed. Pt also has a right foot wound that is already being treated.pts cbg 203

## 2021-12-08 NOTE — ED Notes (Signed)
While this RN was off the floor family called out saying that pt was having an allergic reaction to fentanyl and was feeling SOB. When this RN entered room, family had placed pt on venti mask and 4l O2. Educated family that pt did not receive fentanyl and if pt has any issues please call RN. Informed family not to place pt on o2 or touch any hospital equipment bc it could harm pt. Family verbalizes understanding

## 2021-12-08 NOTE — ED Notes (Signed)
Called  [facility]) @ 20:28 will reach out and repage ortho/trauma Md

## 2021-12-08 NOTE — ED Notes (Signed)
Called Bed Placement for questionable room assignment to [facility] or [facility]. Neither hospital has a room available for this pt per [facility] wit bed placement

## 2021-12-08 NOTE — ED Triage Notes (Signed)
Patient to ED via [facility] from home. Patient to ED had a fall on Wednesday c/o right knee pain since then has been sitting in recliner and not moving. Swelling to knee noted. Per family, not been eating like normal. Patient more lethargic per family and strong ammonia smell noted from EMS.

## 2021-12-08 NOTE — ED Provider Notes (Signed)
[facility] Provider Note    Event Date/Time   First MD Initiated Contact with Patient 12/08/21 1301     (approximate)   History   Fall   HPI  Beth Carroll is a 51 y.o. female extensive past medical history of diabetes, CAD, heart failure and PAD presents to the ER for evaluation of severe right knee pain that occurred after she fell getting out of her truck 3 days ago and has not been able ambulate on it secondary to pain.  Denies any head injury no chest pain or shortness of breath.  Reportedly was found to be saturated in urine.     Physical Exam   Triage Vital Signs: ED Triage Vitals [12/08/21 1234]  Enc Vitals Group     BP 124/69     Pulse Rate 81     Resp 18     Temp 97.7 F (36.5 C)     Temp Source Oral     SpO2 99 %     Weight      Height      Head Circumference      Peak Flow      Pain Score 5     Pain Loc      Pain Edu?      Excl. in [address]?     Most recent vital signs: Vitals:   12/08/21 1354 12/08/21 1519  BP: 109/83 116/66  Pulse: 81 81  Resp: 14 15  Temp:    SpO2: 100% 100%     Constitutional: Alert, frail appearing Eyes: Conjunctivae are normal.  Head: Atraumatic. Nose: No congestion/rhinnorhea. Mouth/Throat: Mucous membranes are moist.   Neck: Painless ROM.  Cardiovascular:   Good peripheral circulation. Respiratory: Normal respiratory effort.  No retractions.  Gastrointestinal: Soft and nontender.  Musculoskeletal: Palpation of the right knee with effusion.  No laceration neurovascular intact distally. Neurologic:  MAE spontaneously. No gross focal neurologic deficits are appreciated.  Skin:  Skin is warm, dry and intact. No rash noted. Psychiatric: Mood and affect are normal. Speech and behavior are normal.    ED Results / Procedures / Treatments   Labs (all labs ordered are listed, but only abnormal results are displayed) Labs Reviewed  URINALYSIS, ROUTINE W REFLEX MICROSCOPIC - Abnormal; Notable for  the following components:      Result Value   Color, Urine YELLOW (*)    APPearance CLOUDY (*)    Glucose, UA >=500 (*)    Hgb urine dipstick MODERATE (*)    Ketones, ur 80 (*)    Protein, ur 100 (*)    Leukocytes,Ua LARGE (*)    RBC / HPF >50 (*)    WBC, UA >50 (*)    Bacteria, UA RARE (*)    All other components within normal limits  BASIC METABOLIC PANEL - Abnormal; Notable for the following components:   Potassium 3.0 (*)    CO2 7 (*)    Glucose, Bld 209 (*)    BUN 22 (*)    Anion gap 21 (*)    All other components within normal limits  URINE CULTURE  CBC  PREGNANCY, URINE     EKG     RADIOLOGY Please see ED Course for my review and interpretation.  I personally reviewed all radiographic images ordered to evaluate for the above acute complaints and reviewed radiology reports and findings.  These findings were personally discussed with the patient.  Please see medical record for radiology report.  PROCEDURES:  Critical Care performed: No  Procedures   MEDICATIONS ORDERED IN ED: Medications  fentaNYL (SUBLIMAZE) injection 50 mcg (50 mcg Intravenous Given 12/08/21 1421)  sodium chloride 0.9 % bolus 500 mL (0 mLs Intravenous Stopped 12/08/21 1522)  cefTRIAXone (ROCEPHIN) 1 g in sodium chloride 0.9 % 100 mL IVPB (0 g Intravenous Stopped 12/08/21 1441)     IMPRESSION / MDM / ASSESSMENT AND PLAN / ED COURSE  I reviewed the triage vital signs and the nursing notes.                              Differential diagnosis includes, but is not limited to, fracture, contusion, electrolyte abnormality, arthritis, dislocation  Patient presenting to the ER for evaluation of symptoms as described above.  Based on symptoms, risk factors and considered above differential, this presenting complaint could reflect a potentially life-threatening illness therefore the patient will be placed on continuous pulse oximetry and telemetry for monitoring.  Laboratory evaluation will  be sent to evaluate for the above complaints.      Clinical Course as of 12/08/21 1553  Tue Dec 08, 2021  1330 X-ray of knee on my review and interpretation does appear consistent with a lateral condyle fracture.  Will consult Ortho. [PR]  4403 Imaging discussed with Dr. Karel Jarvis of orthopedics.  Has recommended CT imaging and likely transfer for Ortho trauma evaluation. [PR]  1552 Patient signed out to oncoming physician pending follow-up from Ortho trauma at [facility]. [PR]    Clinical Course User Index [PR] Merlyn Lot, MD     FINAL CLINICAL IMPRESSION(S) / ED DIAGNOSES   Final diagnoses:  Closed displaced fracture of lateral condyle of right femur, initial encounter [facility])     Rx / DC Orders   ED Discharge Orders     None        Note:  This document was prepared using Dragon voice recognition software and may include unintentional dictation errors.    Merlyn Lot, MD 12/08/21 [phone]

## 2021-12-08 NOTE — ED Notes (Signed)
Called spoke to Beth Carroll about contacting Ortho/Trauma  [phone]

## 2021-12-09 ENCOUNTER — Other Ambulatory Visit (INDEPENDENT_AMBULATORY_CARE_PROVIDER_SITE_OTHER): Payer: Self-pay | Admitting: Nurse Practitioner

## 2021-12-09 ENCOUNTER — Inpatient Hospital Stay (HOSPITAL_COMMUNITY)
Admission: RE | Admit: 2021-12-09 | Discharge: 2021-12-18 | DRG: 853 | Disposition: A | Discharge status: Home Health | Payer: Self-pay | Attending: Internal Medicine | Admitting: Internal Medicine

## 2021-12-09 DIAGNOSIS — L89613 Pressure ulcer of right heel, stage 3: Secondary | ICD-10-CM | POA: Diagnosis present

## 2021-12-09 DIAGNOSIS — Z955 Presence of coronary angioplasty implant and graft: Secondary | ICD-10-CM

## 2021-12-09 DIAGNOSIS — L89152 Pressure ulcer of sacral region, stage 2: Secondary | ICD-10-CM | POA: Diagnosis present

## 2021-12-09 DIAGNOSIS — Z681 Body mass index (BMI) 19 or less, adult: Secondary | ICD-10-CM

## 2021-12-09 DIAGNOSIS — E43 Unspecified severe protein-calorie malnutrition: Secondary | ICD-10-CM | POA: Diagnosis present

## 2021-12-09 DIAGNOSIS — S72421A Displaced fracture of lateral condyle of right femur, initial encounter for closed fracture: Secondary | ICD-10-CM | POA: Diagnosis present

## 2021-12-09 DIAGNOSIS — Z7985 Long-term (current) use of injectable non-insulin antidiabetic drugs: Secondary | ICD-10-CM

## 2021-12-09 DIAGNOSIS — Z8249 Family history of ischemic heart disease and other diseases of the circulatory system: Secondary | ICD-10-CM

## 2021-12-09 DIAGNOSIS — E1151 Type 2 diabetes mellitus with diabetic peripheral angiopathy without gangrene: Secondary | ICD-10-CM | POA: Diagnosis present

## 2021-12-09 DIAGNOSIS — T501X5A Adverse effect of loop [high-ceiling] diuretics, initial encounter: Secondary | ICD-10-CM | POA: Diagnosis present

## 2021-12-09 DIAGNOSIS — E1110 Type 2 diabetes mellitus with ketoacidosis without coma: Secondary | ICD-10-CM

## 2021-12-09 DIAGNOSIS — K219 Gastro-esophageal reflux disease without esophagitis: Secondary | ICD-10-CM | POA: Diagnosis present

## 2021-12-09 DIAGNOSIS — R6521 Severe sepsis with septic shock: Secondary | ICD-10-CM | POA: Diagnosis present

## 2021-12-09 DIAGNOSIS — R54 Age-related physical debility: Secondary | ICD-10-CM | POA: Diagnosis present

## 2021-12-09 DIAGNOSIS — R64 Cachexia: Secondary | ICD-10-CM | POA: Diagnosis present

## 2021-12-09 DIAGNOSIS — Z794 Long term (current) use of insulin: Secondary | ICD-10-CM

## 2021-12-09 DIAGNOSIS — E876 Hypokalemia: Secondary | ICD-10-CM | POA: Diagnosis present

## 2021-12-09 DIAGNOSIS — N179 Acute kidney failure, unspecified: Secondary | ICD-10-CM | POA: Diagnosis present

## 2021-12-09 DIAGNOSIS — E860 Dehydration: Secondary | ICD-10-CM | POA: Diagnosis present

## 2021-12-09 DIAGNOSIS — I10 Essential (primary) hypertension: Secondary | ICD-10-CM | POA: Diagnosis present

## 2021-12-09 DIAGNOSIS — Z7982 Long term (current) use of aspirin: Secondary | ICD-10-CM

## 2021-12-09 DIAGNOSIS — Z87891 Personal history of nicotine dependence: Secondary | ICD-10-CM

## 2021-12-09 DIAGNOSIS — I255 Ischemic cardiomyopathy: Secondary | ICD-10-CM | POA: Diagnosis present

## 2021-12-09 DIAGNOSIS — I110 Hypertensive heart disease with heart failure: Secondary | ICD-10-CM | POA: Diagnosis present

## 2021-12-09 DIAGNOSIS — I739 Peripheral vascular disease, unspecified: Secondary | ICD-10-CM | POA: Diagnosis present

## 2021-12-09 DIAGNOSIS — J449 Chronic obstructive pulmonary disease, unspecified: Secondary | ICD-10-CM | POA: Diagnosis present

## 2021-12-09 DIAGNOSIS — Z720 Tobacco use: Secondary | ICD-10-CM | POA: Diagnosis present

## 2021-12-09 DIAGNOSIS — I2510 Atherosclerotic heart disease of native coronary artery without angina pectoris: Secondary | ICD-10-CM

## 2021-12-09 DIAGNOSIS — A419 Sepsis, unspecified organism: Secondary | ICD-10-CM | POA: Diagnosis present

## 2021-12-09 DIAGNOSIS — Z86011 Personal history of benign neoplasm of the brain: Secondary | ICD-10-CM

## 2021-12-09 DIAGNOSIS — L89612 Pressure ulcer of right heel, stage 2: Secondary | ICD-10-CM | POA: Diagnosis present

## 2021-12-09 DIAGNOSIS — S72413A Displaced unspecified condyle fracture of lower end of unspecified femur, initial encounter for closed fracture: Secondary | ICD-10-CM | POA: Diagnosis present

## 2021-12-09 DIAGNOSIS — G8929 Other chronic pain: Secondary | ICD-10-CM | POA: Diagnosis present

## 2021-12-09 DIAGNOSIS — Z7984 Long term (current) use of oral hypoglycemic drugs: Secondary | ICD-10-CM

## 2021-12-09 DIAGNOSIS — Z79899 Other long term (current) drug therapy: Secondary | ICD-10-CM

## 2021-12-09 DIAGNOSIS — Y92009 Unspecified place in unspecified non-institutional (private) residence as the place of occurrence of the external cause: Secondary | ICD-10-CM

## 2021-12-09 DIAGNOSIS — L89316 Pressure-induced deep tissue damage of right buttock: Secondary | ICD-10-CM | POA: Diagnosis present

## 2021-12-09 DIAGNOSIS — N390 Urinary tract infection, site not specified: Secondary | ICD-10-CM | POA: Diagnosis present

## 2021-12-09 DIAGNOSIS — W1830XA Fall on same level, unspecified, initial encounter: Secondary | ICD-10-CM | POA: Diagnosis present

## 2021-12-09 DIAGNOSIS — G9341 Metabolic encephalopathy: Secondary | ICD-10-CM | POA: Diagnosis present

## 2021-12-09 DIAGNOSIS — D62 Acute posthemorrhagic anemia: Secondary | ICD-10-CM | POA: Diagnosis not present

## 2021-12-09 DIAGNOSIS — I5043 Acute on chronic combined systolic (congestive) and diastolic (congestive) heart failure: Secondary | ICD-10-CM | POA: Insufficient documentation

## 2021-12-09 DIAGNOSIS — R600 Localized edema: Secondary | ICD-10-CM | POA: Diagnosis present

## 2021-12-09 DIAGNOSIS — R627 Adult failure to thrive: Secondary | ICD-10-CM | POA: Diagnosis present

## 2021-12-09 DIAGNOSIS — E785 Hyperlipidemia, unspecified: Secondary | ICD-10-CM | POA: Diagnosis present

## 2021-12-09 DIAGNOSIS — A4151 Sepsis due to Escherichia coli [E. coli]: Principal | ICD-10-CM

## 2021-12-09 DIAGNOSIS — Z7901 Long term (current) use of anticoagulants: Secondary | ICD-10-CM

## 2021-12-09 DIAGNOSIS — Z95828 Presence of other vascular implants and grafts: Secondary | ICD-10-CM

## 2021-12-09 LAB — GLUCOSE, CAPILLARY: Glucose-Capillary: 316 mg/dL — ABNORMAL HIGH (ref 70–99)

## 2021-12-09 MED ORDER — SODIUM CHLORIDE 0.9 % IV SOLN
2.0000 g | Freq: Every day | INTRAVENOUS | Status: DC
  Administered 2021-12-10: 2 g via INTRAVENOUS
  Filled 2021-12-09: qty 20

## 2021-12-09 MED ORDER — LOSARTAN POTASSIUM 25 MG PO TABS
12.5000 mg | ORAL_TABLET | Freq: Every day | ORAL | Status: DC
  Filled 2021-12-09: qty 0.5

## 2021-12-09 MED ORDER — POTASSIUM CHLORIDE CRYS ER 20 MEQ PO TBCR: 40.0000 meq | EXTENDED_RELEASE_TABLET | Freq: Once | ORAL | Status: DC

## 2021-12-09 MED ORDER — HYDROCHLOROTHIAZIDE 25 MG PO TABS: 25.0000 mg | ORAL_TABLET | Freq: Every day | ORAL | Status: DC

## 2021-12-09 MED ORDER — CARVEDILOL 6.25 MG PO TABS: 3.1250 mg | ORAL_TABLET | Freq: Two times a day (BID) | ORAL | Status: DC

## 2021-12-09 MED ORDER — MIDODRINE HCL 5 MG PO TABS: 10.0000 mg | ORAL_TABLET | ORAL | Status: DC

## 2021-12-09 MED ORDER — OXYCODONE HCL 5 MG PO TABS: 5.0000 mg | ORAL_TABLET | ORAL | Status: DC | PRN

## 2021-12-09 MED ORDER — ACETAMINOPHEN 325 MG PO TABS
650.0000 mg | ORAL_TABLET | Freq: Four times a day (QID) | ORAL | Status: DC | PRN
  Administered 2021-12-12 – 2021-12-18 (×5): 650 mg via ORAL
  Filled 2021-12-09 (×5): qty 2

## 2021-12-09 MED ORDER — TIOTROPIUM BROMIDE MONOHYDRATE 18 MCG IN CAPS
1.0000 | ORAL_CAPSULE | Freq: Every day | RESPIRATORY_TRACT | Status: DC
  Filled 2021-12-09: qty 5

## 2021-12-09 MED ORDER — SODIUM CHLORIDE 0.9 % IV BOLUS: 1000.0000 mL | Freq: Once | INTRAVENOUS | Status: DC

## 2021-12-09 MED ORDER — FUROSEMIDE 40 MG PO TABS: 20.0000 mg | ORAL_TABLET | Freq: Every day | ORAL | Status: DC

## 2021-12-09 MED ORDER — LACTATED RINGERS IV BOLUS: 500.0000 mL | Freq: Once | INTRAVENOUS | Status: DC

## 2021-12-09 MED ORDER — LACTATED RINGERS IV SOLN: INTRAVENOUS | Status: DC

## 2021-12-09 MED ORDER — POLYETHYLENE GLYCOL 3350 17 G PO PACK: 17.0000 g | PACK | Freq: Every day | ORAL | Status: DC | PRN

## 2021-12-09 MED ORDER — PANTOPRAZOLE SODIUM 40 MG PO TBEC: 40.0000 mg | DELAYED_RELEASE_TABLET | Freq: Every day | ORAL | Status: DC

## 2021-12-09 MED ORDER — INSULIN GLARGINE-YFGN 100 UNIT/ML ~~LOC~~ SOLN
25.0000 [IU] | Freq: Every day | SUBCUTANEOUS | Status: DC
  Filled 2021-12-09: qty 0.25

## 2021-12-09 MED ORDER — INSULIN ASPART 100 UNIT/ML IJ SOLN: 6.0000 [IU] | Freq: Three times a day (TID) | INTRAMUSCULAR | Status: DC

## 2021-12-09 MED ORDER — ATORVASTATIN CALCIUM 20 MG PO TABS: 20.0000 mg | ORAL_TABLET | Freq: Every day | ORAL | Status: DC

## 2021-12-09 MED ORDER — INSULIN ASPART 100 UNIT/ML FLEXPEN
6.0000 [IU] | PEN_INJECTOR | Freq: Three times a day (TID) | SUBCUTANEOUS | Status: DC
  Filled 2021-12-09: qty 3

## 2021-12-09 MED ORDER — DAPAGLIFLOZIN PROPANEDIOL 10 MG PO TABS
10.0000 mg | ORAL_TABLET | Freq: Every day | ORAL | Status: DC
  Filled 2021-12-09: qty 1

## 2021-12-09 MED ORDER — HYDROMORPHONE HCL 1 MG/ML IJ SOLN
0.5000 mg | INTRAMUSCULAR | Status: DC | PRN
  Administered 2021-12-12 – 2021-12-18 (×25): 0.5 mg via INTRAVENOUS
  Filled 2021-12-09 (×26): qty 0.5

## 2021-12-09 NOTE — ED Notes (Signed)
Pts repeat VS caused in an increased in MEWS score; EDP (Siadecki) notified pt provided a 516m bolus. [facility]

## 2021-12-09 NOTE — H&P (Incomplete)
History and Physical  Beth Carroll VZC:[id number] DOB: 02-01-1970 DOA: 12/09/2021  Referring physician: Accepted by Dr. Sidney Ace, [facility]  PCP: Baxter Hire, MD  Outpatient Specialists: Orthopedic surgery Patient coming from: [facility] as a direct admission  Chief Complaint:  R leg pain and swelling.  HPI: Beth Carroll is a 51 y.o. female with medical history significant for   ED Course: ***  Review of Systems: Review of systems as noted in the HPI. All other systems reviewed and are negative.   Past Medical History:  Diagnosis Date  . Coronary artery disease   . Diabetes mellitus without complication ([place])   . Diabetes mellitus, type 2 ([place])   . HFrEF (heart failure with reduced ejection fraction) ([place])   . Hyperlipemia   . Hypertension   . Ischemic cardiomyopathy   . Orthostatic hypotension   . PAD (peripheral artery disease) ([place])   . Tobacco use    Past Surgical History:  Procedure Laterality Date  . CORONARY STENT INTERVENTION N/A 12/15/2020   Procedure: CORONARY STENT INTERVENTION;  Surgeon: Wellington Hampshire, MD;  Location: [place] [facility];  Service: Cardiovascular;  Laterality: N/A;  . LEFT HEART CATH AND CORONARY ANGIOGRAPHY N/A 12/15/2020   Procedure: LEFT HEART CATH AND CORONARY ANGIOGRAPHY;  Surgeon: Wellington Hampshire, MD;  Location: [place] [facility];  Service: Cardiovascular;  Laterality: N/A;  . LOWER EXTREMITY ANGIOGRAPHY Left 12/10/2020   Procedure: Lower Extremity Angiography;  Surgeon: Algernon Huxley, MD;  Location: [place] [facility];  Service: Cardiovascular;  Laterality: Left;  . LOWER EXTREMITY ANGIOGRAPHY Right 12/25/2020   Procedure: LOWER EXTREMITY ANGIOGRAPHY;  Surgeon: Algernon Huxley, MD;  Location: [place] [facility];  Service: Cardiovascular;  Laterality: Right;  . LOWER EXTREMITY ANGIOGRAPHY Right 08/19/2021   Procedure: Lower Extremity Angiography;  Surgeon: Algernon Huxley, MD;  Location: [place] [facility];  Service: Cardiovascular;   Laterality: Right;  . LOWER EXTREMITY ANGIOGRAPHY Right 08/19/2021   Procedure: Lower Extremity Angiography;  Surgeon: Algernon Huxley, MD;  Location: [place] [facility];  Service: Cardiovascular;  Laterality: Right;    Social History:  reports that she quit smoking about a year ago. Her smoking use included cigarettes. She has never used smokeless tobacco. She reports that she does not drink alcohol. No history on file for drug use.   No Known Allergies  Family History  Problem Relation Age of Onset  . Hypertension Father     ***  Prior to Admission medications   Medication Sig Start Date End Date Taking? Authorizing Provider  albuterol (VENTOLIN HFA) 108 (90 Base) MCG/ACT inhaler Inhale 2 puffs into the lungs every 6 (six) hours as needed.    [provider]  aspirin EC 81 MG EC tablet Take 1 tablet (81 mg total) by mouth daily. Swallow whole. 12/17/20   Elgergawy, Silver Huguenin, MD  atorvastatin (LIPITOR) 20 MG tablet Take 1 tablet (20 mg total) by mouth daily. 12/17/20   Elgergawy, Silver Huguenin, MD  carvedilol (COREG) 3.125 MG tablet Take 1 tablet (3.125 mg total) by mouth 2 (two) times daily with a meal. 12/16/20   Elgergawy, Silver Huguenin, MD  ELIQUIS 5 MG TABS tablet TAKE 1 TABLET BY MOUTH TWICE A DAY 12/09/21   Kris Hartmann, NP  FARXIGA 10 MG TABS tablet TAKE 1 TABLET BY MOUTH EVERY DAY BEFORE BREAKFAST 11/10/21   Furth, Cadence H, PA-C  furosemide (LASIX) 20 MG tablet Take 20 mg by mouth daily. 12/03/20   [provider]  hydrochlorothiazide (HYDRODIURIL) 25 MG tablet Take 25 mg by mouth daily. 10/11/21   [provider]  insulin aspart (NOVOLOG) 100 UNIT/ML FlexPen Inject 6-7 Units into the skin 3 (three) times daily with meals. 01/30/21 01/30/22  [provider]  insulin glargine (LANTUS SOLOSTAR) 100 UNIT/ML Solostar Pen Inject 25 Units into the skin daily.    [provider]  losartan (COZAAR) 25 MG tablet TAKE 1/2 TABLET BY MOUTH EVERY DAY 08/31/21    Minna Merritts, MD  pantoprazole (PROTONIX) 40 MG tablet Take 1 tablet (40 mg total) by mouth daily. 12/17/20   Elgergawy, Silver Huguenin, MD  Semaglutide,0.25 or 0.'5MG'$ /DOS, 2 MG/3ML SOPN Inject into the skin. 10/27/21   [provider]  tiotropium (SPIRIVA) 18 MCG inhalation capsule Place 1 capsule into inhaler and inhale daily. 09/24/21   [provider]  traMADol (ULTRAM) 50 MG tablet Take 1 tablet (50 mg total) by mouth every 6 (six) hours as needed for severe pain. 08/26/21   Kris Hartmann, NP  Vitamin D, Ergocalciferol, (DRISDOL) 1.25 MG (50000 UNIT) CAPS capsule Take 50,000 Units by mouth once a week. 10/28/20   [provider]    Physical Exam: BP (!) 83/65   Pulse 61   Resp 19   SpO2 100%   General: 51 y.o. year-old female well developed well nourished in no acute distress.  Alert and oriented x3. Cardiovascular: Regular rate and rhythm with no rubs or gallops.  No thyromegaly or JVD noted.  No lower extremity edema. 2/4 pulses in all 4 extremities. Respiratory: Clear to auscultation with no wheezes or rales. Good inspiratory effort. Abdomen: Soft nontender nondistended with normal bowel sounds x4 quadrants. Muskuloskeletal: No cyanosis, clubbing or edema noted bilaterally Neuro: CN II-XII intact, strength, sensation, reflexes Skin: No ulcerative lesions noted or rashes Psychiatry: Judgement and insight appear normal. Mood is appropriate for condition and setting          Labs on Admission:  Basic Metabolic Panel: Recent Labs  Lab 12/08/21 1239  NA 137  K 3.0*  CL 109  CO2 7*  GLUCOSE 209*  BUN 22*  CREATININE 0.74  CALCIUM 9.7  MG 2.4   Liver Function Tests: No results for input(s): "AST", "ALT", "ALKPHOS", "BILITOT", "PROT", "ALBUMIN" in the last 168 hours. No results for input(s): "LIPASE", "AMYLASE" in the last 168 hours. No results for input(s): "AMMONIA" in the last 168 hours. CBC: Recent Labs  Lab 12/08/21 1239  WBC 8.4  HGB 12.0   HCT 37.0  MCV 84.1  PLT 304   Cardiac Enzymes: No results for input(s): "CKTOTAL", "CKMB", "CKMBINDEX", "TROPONINI" in the last 168 hours.  BNP (last 3 results) No results for input(s): "BNP" in the last 8760 hours.  ProBNP (last 3 results) No results for input(s): "PROBNP" in the last 8760 hours.  CBG: Recent Labs  Lab 12/09/21 2146  GLUCAP 316*    Radiological Exams on Admission: DG Chest 2 View  Result Date: 12/08/2021 CLINICAL DATA:  Cough EXAM: CHEST - 2 VIEW COMPARISON:  12/09/2020 FINDINGS: Possible streaky developing infiltrate at left base. No consolidation. Normal cardiac size. No pneumothorax. Aortic atherosclerosis IMPRESSION: Possible streaky developing infiltrate at the left base. Electronically Signed   By: Donavan Foil M.D.   On: 12/08/2021 21:21   CT Knee Right Wo Contrast  Result Date: 12/08/2021 CLINICAL DATA:  [place] 6 days ago.  Evaluate left neck fractures. EXAM: CT OF THE RIGHT KNEE WITHOUT CONTRAST TECHNIQUE: Multidetector CT imaging  of the right knee was performed according to the standard protocol. Multiplanar CT image reconstructions were also generated. RADIATION DOSE REDUCTION: This exam was performed according to the departmental dose-optimization program which includes automated exposure control, adjustment of the mA and/or kV according to patient size and/or use of iterative reconstruction technique. COMPARISON:  Radiographs, same date. FINDINGS: Complex comminuted intra-articular fracture involving the lateral femoral condyle. Early bony resorptive changes along the fracture site. Maximum step-off at the articular surfaces 8 mm. The tibia, fibula and patella are intact. There is significant age advanced osteoporosis. Large lipohemarthrosis is noted. Grossly by CT the cruciate and collateral ligaments are intact. The quadriceps and patellar tendons are intact. Popliteal artery graft noted. IMPRESSION: 1. Complex comminuted intra-articular fracture  involving the lateral femoral condyle. Maximum step-off at the articular surfaces 8 mm. 2. Large lipohemarthrosis. 3. Age advanced osteoporosis. 4. Grossly by CT the cruciate and collateral ligaments are intact. Electronically Signed   By: Marijo Sanes M.D.   On: 12/08/2021 14:58   DG Knee 2 Views Right  Result Date: 12/08/2021 CLINICAL DATA:  Fall 6 days ago.  Pain. EXAM: RIGHT KNEE - 1-2 VIEW COMPARISON:  None Available. FINDINGS: There is diffuse decreased bone mineralization. There is cortical step-off of the mid and lateral aspect of a weight-bearing lateral femoral condyle with the lateral portion displaced approximately 7 mm superior to the medial portion on frontal view. There is also similar superior cortical step-off of the posterosuperior aspect of the lateral femoral condyle on lateral view. Findings are suspicious for a displaced fracture. Moderate joint effusion with possible nondependent fat indicating a lipohemarthrosis from the acute fracture. Mild medial joint space narrowing and peripheral osteophytosis. Multiple contiguous vascular stents are seen within the superficial femoral artery through the popliteal artery. Moderate vascular calcifications. IMPRESSION: 1. Acute displaced fracture of the weight-bearing lateral femoral condyle with superior displacement of the lateral aspect of the lateral femoral condyle. 2. Moderate joint effusion with possible nondependent fat indicating a lipohemarthrosis related to the acute fracture. Electronically Signed   By: Yvonne Kendall M.D.   On: 12/08/2021 13:36    EKG: I independently viewed the EKG done and my findings are as followed: ***   Assessment/Plan Present on Admission: **None**  Active Problems:   * No active hospital problems. *   DVT prophylaxis: ***   Code Status: ***   Family Communication: ***   Disposition Plan: ***   Consults called: ***   Admission status: ***    Status is:  Inpatient {Inpatient:23812}   Kayleen Memos MD [facility] Pager [phone]  If 7PM-7AM, please contact night-coverage [email or web address] Password [facility]  12/09/2021, 10:15 PM

## 2021-12-09 NOTE — ED Provider Notes (Addendum)
-----------------------------------------   8:47 PM on 12/09/2021 -----------------------------------------   [facility] has arrived for transport.  The patient's vital signs were rechecked and the RN was unable to get an oral temperature.  Rectal temperature is 91.5.  The patient's most recent oral temperature from 1930 was 97.7.  The patient's vital signs are otherwise stable and unchanged from the last several hours.  The patient is actively being treated for UTI with ceftriaxone and has also been getting maintenance fluid and opiates for pain, most recently around 5 PM.  On reassessment the patient is sleepy and somewhat weak appearing but arousable and following commands, consistent with her documented mental status throughout the day.  Her skin feels warm and her extremities are well perfused.  I spoke to her daughter who states that the patient has been intermittently slightly confused throughout her ED stay but not had any significant deterioration or change in her mental status in the last few hours.    Overall I suspect that the hypothermia is most likely related to the UTI, although the patient otherwise does not meet sepsis criteria.  The blood pressure is borderline low but unchanged throughout the day today.  The patient has already been receiving antibiotics and I ordered an additional fluid bolus.  She will need to be placed on a [employer].  Given the unchanged blood pressure and other vital signs, the patient is hemodynamically stable and I do not expect any deterioration en route.  The charge RN contacted the floor nurse at [facility] about the change in the temperature and the patient's clinical status, and the floor is aware and still comfortable accepting the patient.  The [facility] providers are fully are also comfortable proceeding with transport.  The patient will need external warming, a lactic acid check, and additional fluids.  However all of these can be initiated during transport and/or  at [facility].  Based on my evaluation of the patient at the time of transfer, it will be safest and best for her overall care to proceed with the transfer and definitive treatment for the fracture rather than potentially further significantly delaying her care by keeping her here for additional work-up based on this isolated change in her temperature.    I also informed the admitting physicians Dr. Sidney Ace and Dr. Doreatha Martin at [facility] via secure chat.  Therefore, at this time, the patient is appropriate for transfer as planned.       Arta Silence, MD 12/09/21 2116

## 2021-12-09 NOTE — ED Notes (Addendum)
Charge nurse and security in room to speak with pts family due to concerns of the assigned RN. Family educated on the importance of not initiating medical devices like wall oxygen and non- re breather mask unless the RN or medical staff are the ones initiating. Family at bedside provide Probation officer and security with verbal feedback of understanding.

## 2021-12-09 NOTE — ED Notes (Signed)
Called pt. Placement [facility]) for update on bed status for rm 2 transfer. Per pt. Placement there are 27 waiting for beds at [facility].  No beds available at this time

## 2021-12-09 NOTE — ED Notes (Signed)
Pts family yelling from the room that their "mother is dehydrated", Primary RN very responsive to family's request and water provided after ok'd with MD. Family yelling that we are the nurses and we aren't doing anything to help their mother. Charge RN went into room, explained that there was a call bell that was to be used and there would be no yelling at the nursing staff or security would be called.

## 2021-12-09 NOTE — ED Notes (Addendum)
Pt bed was wet and purewick by side of bed. This RN and another RN cleaned her and placed new purewick in place. Pt had stage 2 wound on sacrum. Daughter stated that pt has been in bed since Wednesday. This RN cleaned bottom and placed sacrum foam.

## 2021-12-09 NOTE — ED Notes (Signed)
Pt and pt's daughter told by this RN that pt is NPO at midnight and surgery is planned for tomorrow. Both stated understanding.

## 2021-12-09 NOTE — H&P (Addendum)
History and Physical  Beth Carroll EXH:[id number] DOB: 10/15/1970 DOA: 12/09/2021  Referring physician: Accepted by Dr. Sidney Ace, [facility]  PCP: Baxter Hire, MD  Outpatient Specialists: Orthopedic surgery Patient coming from: [facility] as a direct admission  Chief Complaint:  R leg pain and swelling.  HPI: Beth Carroll is a 51 y.o. female with medical history significant for type 2 diabetes, coronary artery disease, peripheral artery disease on Eliquis, HFrEF 40 to 45%, who initially presented to [facility] ED from home after a fall 3 days prior to her presentation.  She was unable to ambulate due to severe right knee pain and was found at home soaked in her urine.    In the ED at [facility], work-up revealed right complex comminuted intra-articular fracture of the right lateral femoral condyle and large knee lipohemarthrosis.  Orthopedic surgery at [facility] recommended transfer for complicated fracture.  Dr. Doreatha Martin at [facility] agreed to see the patient in consultation when she arrives.  Additionally, UA in the ED was positive for pyuria for which she was started on Rocephin.  At [facility] unit 5[facility] the patient was noted to be hypothermic with a temperature as low as in the 90 F, also hypotensive with SBP less than 90.  She received 500 cc IV fluid bolus with no significant improvement in her SBP or MAP.  At bedside the patient was noted to be lethargic and unable to provide a history.  Due to concern for developing septic shock, CCM was consulted.  Stat ABG revealed pH of 7.12, PCO2 less than 18 with serum bicarb of 7.  She was subsequently started on isotonic bicarb drip for severe metabolic acidosis, vasopressor Levophed, and potassium replacement for serum potassium of 3.0.  The patient was seen, evaluated by PCCM and transferred to ICU, higher level of care, for further management.  The Probation officer updated family in person.  ED Course: Direct admit from [facility] ED.  Review of Systems: Review of systems as noted in the HPI.  All other systems reviewed and are negative.   Past Medical History:  Diagnosis Date   Coronary artery disease    Diabetes mellitus without complication ([place])    Diabetes mellitus, type 2 (HCC)    HFrEF (heart failure with reduced ejection fraction) ([place])    Hyperlipemia    Hypertension    Ischemic cardiomyopathy    Orthostatic hypotension    PAD (peripheral artery disease) ([place])    Tobacco use    Past Surgical History:  Procedure Laterality Date   CORONARY STENT INTERVENTION N/A 12/15/2020   Procedure: CORONARY STENT INTERVENTION;  Surgeon: Wellington Hampshire, MD;  Location: [place] [facility];  Service: Cardiovascular;  Laterality: N/A;   LEFT HEART CATH AND CORONARY ANGIOGRAPHY N/A 12/15/2020   Procedure: LEFT HEART CATH AND CORONARY ANGIOGRAPHY;  Surgeon: Wellington Hampshire, MD;  Location: [place] [facility];  Service: Cardiovascular;  Laterality: N/A;   LOWER EXTREMITY ANGIOGRAPHY Left 12/10/2020   Procedure: Lower Extremity Angiography;  Surgeon: Algernon Huxley, MD;  Location: [place] [facility];  Service: Cardiovascular;  Laterality: Left;   LOWER EXTREMITY ANGIOGRAPHY Right 12/25/2020   Procedure: LOWER EXTREMITY ANGIOGRAPHY;  Surgeon: Algernon Huxley, MD;  Location: [place] [facility];  Service: Cardiovascular;  Laterality: Right;   LOWER EXTREMITY ANGIOGRAPHY Right 08/19/2021   Procedure: Lower Extremity Angiography;  Surgeon: Algernon Huxley, MD;  Location: [place] [facility];  Service: Cardiovascular;  Laterality: Right;   LOWER EXTREMITY ANGIOGRAPHY Right 08/19/2021   Procedure:  Lower Extremity Angiography;  Surgeon: Algernon Huxley, MD;  Location: [place] [facility];  Service: Cardiovascular;  Laterality: Right;    Social History:  reports that she quit smoking about a year ago. Her smoking use included cigarettes. She has never used smokeless tobacco. She reports that she does not drink alcohol. No history on file for drug use.   No Known Allergies  Family History   Problem Relation Age of Onset   Hypertension Father       Prior to Admission medications   Medication Sig Start Date End Date Taking? Authorizing Provider  albuterol (VENTOLIN HFA) 108 (90 Base) MCG/ACT inhaler Inhale 2 puffs into the lungs every 6 (six) hours as needed.    [provider]  aspirin EC 81 MG EC tablet Take 1 tablet (81 mg total) by mouth daily. Swallow whole. 12/17/20   Elgergawy, Silver Huguenin, MD  atorvastatin (LIPITOR) 20 MG tablet Take 1 tablet (20 mg total) by mouth daily. 12/17/20   Elgergawy, Silver Huguenin, MD  carvedilol (COREG) 3.125 MG tablet Take 1 tablet (3.125 mg total) by mouth 2 (two) times daily with a meal. 12/16/20   Elgergawy, Silver Huguenin, MD  ELIQUIS 5 MG TABS tablet TAKE 1 TABLET BY MOUTH TWICE A DAY 12/09/21   Kris Hartmann, NP  FARXIGA 10 MG TABS tablet TAKE 1 TABLET BY MOUTH EVERY DAY BEFORE BREAKFAST 11/10/21   Furth, Cadence H, PA-C  furosemide (LASIX) 20 MG tablet Take 20 mg by mouth daily. 12/03/20   [provider]  hydrochlorothiazide (HYDRODIURIL) 25 MG tablet Take 25 mg by mouth daily. 10/11/21   [provider]  insulin aspart (NOVOLOG) 100 UNIT/ML FlexPen Inject 6-7 Units into the skin 3 (three) times daily with meals. 01/30/21 01/30/22  [provider]  insulin glargine (LANTUS SOLOSTAR) 100 UNIT/ML Solostar Pen Inject 25 Units into the skin daily.    [provider]  losartan (COZAAR) 25 MG tablet TAKE 1/2 TABLET BY MOUTH EVERY DAY 08/31/21   Minna Merritts, MD  pantoprazole (PROTONIX) 40 MG tablet Take 1 tablet (40 mg total) by mouth daily. 12/17/20   Elgergawy, Silver Huguenin, MD  Semaglutide,0.25 or 0.'5MG'$ /DOS, 2 MG/3ML SOPN Inject into the skin. 10/27/21   [provider]  tiotropium (SPIRIVA) 18 MCG inhalation capsule Place 1 capsule into inhaler and inhale daily. 09/24/21   [provider]  traMADol (ULTRAM) 50 MG tablet Take 1 tablet (50 mg total) by mouth every 6 (six) hours as needed for severe  pain. 08/26/21   Kris Hartmann, NP  Vitamin D, Ergocalciferol, (DRISDOL) 1.25 MG (50000 UNIT) CAPS capsule Take 50,000 Units by mouth once a week. 10/28/20   [provider]    Physical Exam: BP (!) 74/54 (BP Location: Right Arm)   Pulse 77   Temp (!) 96.3 F (35.7 C) (Rectal)   Resp 18   Ht '5\' 6"'$  (1.676 m)   Wt 47.9 kg   SpO2 100%   BMI 17.04 kg/m   General: 51 y.o. year-old female frail-appearing, lethargic. Cardiovascular: Regular rate and rhythm with no rubs or gallops.  No thyromegaly or JVD noted.  Right lower extremity edema. Respiratory: Clear to auscultation with no wheezes or rales.  Poor inspiratory effort. Abdomen: Soft nontender nondistended with normal bowel sounds x4 quadrants. Muskuloskeletal: No cyanosis noted. Neuro: CN II-XII intact, strength, sensation, reflexes Skin: Right heel ulcer Psychiatry: Unable to assess mood or judgment due to lethargy.  Labs on Admission:  Basic Metabolic Panel: Recent Labs  Lab 12/08/21 1239  NA 137  K 3.0*  CL 109  CO2 7*  GLUCOSE 209*  BUN 22*  CREATININE 0.74  CALCIUM 9.7  MG 2.4   Liver Function Tests: No results for input(s): "AST", "ALT", "ALKPHOS", "BILITOT", "PROT", "ALBUMIN" in the last 168 hours. No results for input(s): "LIPASE", "AMYLASE" in the last 168 hours. No results for input(s): "AMMONIA" in the last 168 hours. CBC: Recent Labs  Lab 12/08/21 1239 12/10/21 0158  WBC 8.4 9.8  NEUTROABS  --  8.8*  HGB 12.0 10.7*  HCT 37.0 33.2*  MCV 84.1 87.1  PLT 304 270   Cardiac Enzymes: No results for input(s): "CKTOTAL", "CKMB", "CKMBINDEX", "TROPONINI" in the last 168 hours.  BNP (last 3 results) No results for input(s): "BNP" in the last 8760 hours.  ProBNP (last 3 results) No results for input(s): "PROBNP" in the last 8760 hours.  CBG: Recent Labs  Lab 12/09/21 2146  GLUCAP 316*    Radiological Exams on Admission: DG CHEST PORT 1 VIEW  Result Date: 12/10/2021 CLINICAL  DATA:  Altered mental status EXAM: PORTABLE CHEST 1 VIEW COMPARISON:  None Available. FINDINGS: The heart size and mediastinal contours are within normal limits. Both lungs are clear. The visualized skeletal structures are unremarkable. IMPRESSION: No active disease. Electronically Signed   By: Fidela Salisbury M.D.   On: 12/10/2021 01:11   DG Chest 2 View  Result Date: 12/08/2021 CLINICAL DATA:  Cough EXAM: CHEST - 2 VIEW COMPARISON:  12/09/2020 FINDINGS: Possible streaky developing infiltrate at left base. No consolidation. Normal cardiac size. No pneumothorax. Aortic atherosclerosis IMPRESSION: Possible streaky developing infiltrate at the left base. Electronically Signed   By: Donavan Foil M.D.   On: 12/08/2021 21:21   CT Knee Right Wo Contrast  Result Date: 12/08/2021 CLINICAL DATA:  [place] 6 days ago.  Evaluate left neck fractures. EXAM: CT OF THE RIGHT KNEE WITHOUT CONTRAST TECHNIQUE: Multidetector CT imaging of the right knee was performed according to the standard protocol. Multiplanar CT image reconstructions were also generated. RADIATION DOSE REDUCTION: This exam was performed according to the departmental dose-optimization program which includes automated exposure control, adjustment of the mA and/or kV according to patient size and/or use of iterative reconstruction technique. COMPARISON:  Radiographs, same date. FINDINGS: Complex comminuted intra-articular fracture involving the lateral femoral condyle. Early bony resorptive changes along the fracture site. Maximum step-off at the articular surfaces 8 mm. The tibia, fibula and patella are intact. There is significant age advanced osteoporosis. Large lipohemarthrosis is noted. Grossly by CT the cruciate and collateral ligaments are intact. The quadriceps and patellar tendons are intact. Popliteal artery graft noted. IMPRESSION: 1. Complex comminuted intra-articular fracture involving the lateral femoral condyle. Maximum step-off at the articular  surfaces 8 mm. 2. Large lipohemarthrosis. 3. Age advanced osteoporosis. 4. Grossly by CT the cruciate and collateral ligaments are intact. Electronically Signed   By: Marijo Sanes M.D.   On: 12/08/2021 14:58   DG Knee 2 Views Right  Result Date: 12/08/2021 CLINICAL DATA:  Fall 6 days ago.  Pain. EXAM: RIGHT KNEE - 1-2 VIEW COMPARISON:  None Available. FINDINGS: There is diffuse decreased bone mineralization. There is cortical step-off of the mid and lateral aspect of a weight-bearing lateral femoral condyle with the lateral portion displaced approximately 7 mm superior to the medial portion on frontal view. There is also similar superior cortical step-off of the posterosuperior aspect of the lateral femoral condyle  on lateral view. Findings are suspicious for a displaced fracture. Moderate joint effusion with possible nondependent fat indicating a lipohemarthrosis from the acute fracture. Mild medial joint space narrowing and peripheral osteophytosis. Multiple contiguous vascular stents are seen within the superficial femoral artery through the popliteal artery. Moderate vascular calcifications. IMPRESSION: 1. Acute displaced fracture of the weight-bearing lateral femoral condyle with superior displacement of the lateral aspect of the lateral femoral condyle. 2. Moderate joint effusion with possible nondependent fat indicating a lipohemarthrosis related to the acute fracture. Electronically Signed   By: Yvonne Kendall M.D.   On: 12/08/2021 13:36    EKG: I independently viewed the EKG done and my findings are as followed: None available at the time of this visit.  Assessment/Plan Present on Admission:  Femoral condyle fracture (HCC)  Principal Problem:   Femoral condyle fracture (HCC)  Right femoral condyle fracture, POA Post mechanical fall Transferred to [facility] for possible surgical repair Orthopedic surgery will see in consultation. Pain control in place  Septic shock secondary to UTI, POA UA  positive for pyuria Follow urine culture and peripheral blood cultures IV vancomycin, Zosyn Vasopressor Levophed, transferred to the ICU. Closely monitor vital signs and maintain MAP greater than 65.  Severe high anion gap metabolic acidosis ABG pH 6.78, PCO2 less than 18, serum bicarb 7 Anion gap 21, lactic acid pending Isotonic bicarb drip Repeat ABG in 2 hours. Repeat chemistry panel  Acute metabolic encephalopathy in the setting of septic shock Treat underlying condition Reorient as needed N.p.o. until more alert and able to swallow safely.  Type 2 diabetes with hyperglycemia Hold off home oral hypoglycemics Hemoglobin A1c 8.1 on 12/10/2021 Start insulin sliding scale every 4 hours while NPO  Acute blood loss anemia in the setting of bone fracture Hemoglobin 10.7 from 12.0. Closely monitor H&H  Hypokalemia Serum potassium 3.0 Replete as indicated  HFrEF 40 to 45% Closely monitor volume status while on IV fluid Hold off home cardiac medications due to septic shock Strict I's and O's and daily weight  Right heel pressure wound Wound care specialist consult  Peripheral artery disease on Eliquis Eliquis on hold due to possible surgical intervention   GERD Resume home PPI when no longer n.p.o.   Critical care time: 65 minutes     DVT prophylaxis: Subcu heparin 3 times daily  Code Status: Full code  Family Communication: Daughter and daughter's fianc at bedside.  Disposition Plan: Initially admitted to [place], transferred to ICU.  Consults called: Orthopedic surgery, PCCM.  Admission status: Inpatient status.   Status is: Inpatient The patient requires at least 2 midnights for further evaluation and treatment of present condition.   Kayleen Memos MD [facility] Pager [phone]  If 7PM-7AM, please contact night-coverage [email or web address] Password [email or web address]  12/10/2021, 2:36 AM

## 2021-12-09 NOTE — ED Provider Notes (Signed)
-----------------------------------------   7:17 AM on 12/09/2021 -----------------------------------------   No events overnight.  Patient remains in the emergency department awaiting bed availability at [facility].   Paulette Blanch, MD 12/09/21 [phone]

## 2021-12-09 NOTE — ED Provider Notes (Signed)
8:28 AM  Patient currently waiting for a bed at [facility] to the hospitalist service for a femur fracture that is complicated.  No beds currently available.  Patient given p.o. Percocet.  Diet ordered.   Nathaniel Man, MD 12/09/21 [phone]

## 2021-12-09 NOTE — ED Notes (Signed)
Pt disoriented, trying to grasp at something that is not there and speaking incomprehensible. Advised daughter and pt to hold off on the fentanyl and try percocet with continued monitoring of mental status. Pt's daughter states understanding and agreement.

## 2021-12-09 NOTE — Progress Notes (Signed)
AC and charge RN for 6E at bedside.  Rectal temp 95.5, beir warmer continues

## 2021-12-09 NOTE — ED Notes (Addendum)
Family member at into the doorway. Stating "she is diabetic and needs a bag of fluid." Explained that physician was placing the order for diet and a meal tray was coming. Family continuously being rude and continued to state she is dehydrated.

## 2021-12-09 NOTE — Progress Notes (Signed)
This Probation officer was informed prior to pt's arrival of rectal temp 91.5 x3 from ED RN at [facility].  BP have been low 90's over 50's, d/t PRN pain medication.  566m NS bolus given per ED RN.  Due to latest VS, raised MEWS score to 4, wasn't sure if appropriate for medical-surgical unit.  Pt Ax4, slow to respond and lethargic, per report.  This wProbation officerthen notified charge [facility] and [facility] if [facility] d/t rectal temp and low BP.  Pt arrived to this unit @ 2130 via [facility], [facility]  Pt continues to answer questions appropriately only to que, responds to pain, Ax4.  Pt noted to have stage 3 wound to right heel along with ace wrap to fractured right lower extremity.  Mepilex to sacral area for excoriation and protection. MD notified for admission and pt's condition.  Family at bedside and updated.  Pt to be moved to progressive unit.  Pt stable at this time with cardiac monitor, 02 cont pulse ox and warming blanket.  BP continues to be low 84/58, rectal temp 90.5.  MD left bedside.

## 2021-12-10 ENCOUNTER — Inpatient Hospital Stay (HOSPITAL_COMMUNITY): Payer: Self-pay

## 2021-12-10 ENCOUNTER — Encounter (HOSPITAL_COMMUNITY)
Admission: RE | Disposition: A | Discharge status: Home Health | Payer: Self-pay | Source: Home / Self Care | Attending: Internal Medicine

## 2021-12-10 ENCOUNTER — Inpatient Hospital Stay: Admit: 2021-12-10 | Payer: Self-pay | Admitting: Student

## 2021-12-10 DIAGNOSIS — R579 Shock, unspecified: Secondary | ICD-10-CM

## 2021-12-10 DIAGNOSIS — G9340 Encephalopathy, unspecified: Secondary | ICD-10-CM

## 2021-12-10 DIAGNOSIS — S72413A Displaced unspecified condyle fracture of lower end of unspecified femur, initial encounter for closed fracture: Secondary | ICD-10-CM

## 2021-12-10 DIAGNOSIS — E8889 Other specified metabolic disorders: Secondary | ICD-10-CM

## 2021-12-10 LAB — BASIC METABOLIC PANEL
Anion gap: 20 — ABNORMAL HIGH (ref 5–15)
Anion gap: 13 (ref 5–15)
Anion gap: 9 (ref 5–15)
Anion gap: 5 (ref 5–15)
Anion gap: 7 (ref 5–15)
BUN: 34 mg/dL — ABNORMAL HIGH (ref 6–20)
BUN: 34 mg/dL — ABNORMAL HIGH (ref 6–20)
BUN: 32 mg/dL — ABNORMAL HIGH (ref 6–20)
BUN: 28 mg/dL — ABNORMAL HIGH (ref 6–20)
BUN: 27 mg/dL — ABNORMAL HIGH (ref 6–20)
BUN: 24 mg/dL — ABNORMAL HIGH (ref 6–20)
BUN: 22 mg/dL — ABNORMAL HIGH (ref 6–20)
CO2: <7 mmol/L — ABNORMAL LOW (ref 22–32)
CO2: <7 mmol/L — ABNORMAL LOW (ref 22–32)
CO2: 8 mmol/L — ABNORMAL LOW (ref 22–32)
CO2: 12 mmol/L — ABNORMAL LOW (ref 22–32)
CO2: 16 mmol/L — ABNORMAL LOW (ref 22–32)
CO2: 19 mmol/L — ABNORMAL LOW (ref 22–32)
CO2: 21 mmol/L — ABNORMAL LOW (ref 22–32)
Calcium: 8.7 mg/dL — ABNORMAL LOW (ref 8.9–10.3)
Calcium: 8.7 mg/dL — ABNORMAL LOW (ref 8.9–10.3)
Calcium: 8.7 mg/dL — ABNORMAL LOW (ref 8.9–10.3)
Calcium: 8.4 mg/dL — ABNORMAL LOW (ref 8.9–10.3)
Calcium: 8.6 mg/dL — ABNORMAL LOW (ref 8.9–10.3)
Calcium: 8.6 mg/dL — ABNORMAL LOW (ref 8.9–10.3)
Calcium: 8.8 mg/dL — ABNORMAL LOW (ref 8.9–10.3)
Chloride: 106 mmol/L (ref 98–111)
Chloride: 107 mmol/L (ref 98–111)
Chloride: 107 mmol/L (ref 98–111)
Chloride: 114 mmol/L — ABNORMAL HIGH (ref 98–111)
Chloride: 116 mmol/L — ABNORMAL HIGH (ref 98–111)
Chloride: 117 mmol/L — ABNORMAL HIGH (ref 98–111)
Chloride: 116 mmol/L — ABNORMAL HIGH (ref 98–111)
Creatinine, Ser: 1.25 mg/dL — ABNORMAL HIGH (ref 0.44–1.00)
Creatinine, Ser: 1.45 mg/dL — ABNORMAL HIGH (ref 0.44–1.00)
Creatinine, Ser: 1.38 mg/dL — ABNORMAL HIGH (ref 0.44–1.00)
Creatinine, Ser: 1.27 mg/dL — ABNORMAL HIGH (ref 0.44–1.00)
Creatinine, Ser: 1.16 mg/dL — ABNORMAL HIGH (ref 0.44–1.00)
Creatinine, Ser: 0.8 mg/dL (ref 0.44–1.00)
Creatinine, Ser: 0.72 mg/dL (ref 0.44–1.00)
GFR, Estimated: 52 mL/min — ABNORMAL LOW (ref >60)
GFR, Estimated: 43 mL/min — ABNORMAL LOW (ref >60)
GFR, Estimated: 46 mL/min — ABNORMAL LOW (ref >60)
GFR, Estimated: 51 mL/min — ABNORMAL LOW (ref >60)
GFR, Estimated: 57 mL/min — ABNORMAL LOW (ref >60)
GFR, Estimated: >60 mL/min (ref >60)
GFR, Estimated: >60 mL/min (ref >60)
Glucose, Bld: 371 mg/dL — ABNORMAL HIGH (ref 70–99)
Glucose, Bld: 351 mg/dL — ABNORMAL HIGH (ref 70–99)
Glucose, Bld: 337 mg/dL — ABNORMAL HIGH (ref 70–99)
Glucose, Bld: 275 mg/dL — ABNORMAL HIGH (ref 70–99)
Glucose, Bld: 253 mg/dL — ABNORMAL HIGH (ref 70–99)
Glucose, Bld: 197 mg/dL — ABNORMAL HIGH (ref 70–99)
Glucose, Bld: 168 mg/dL — ABNORMAL HIGH (ref 70–99)
Potassium: 2.5 mmol/L — CL (ref 3.5–5.1)
Potassium: 2.1 mmol/L — CL (ref 3.5–5.1)
Potassium: 2.4 mmol/L — CL (ref 3.5–5.1)
Potassium: 2.6 mmol/L — CL (ref 3.5–5.1)
Potassium: 2.7 mmol/L — CL (ref 3.5–5.1)
Potassium: 3.1 mmol/L — ABNORMAL LOW (ref 3.5–5.1)
Potassium: 3.3 mmol/L — ABNORMAL LOW (ref 3.5–5.1)
Sodium: 135 mmol/L (ref 135–145)
Sodium: 135 mmol/L (ref 135–145)
Sodium: 135 mmol/L (ref 135–145)
Sodium: 139 mmol/L (ref 135–145)
Sodium: 141 mmol/L (ref 135–145)
Sodium: 141 mmol/L (ref 135–145)
Sodium: 144 mmol/L (ref 135–145)

## 2021-12-10 LAB — CBC WITH DIFFERENTIAL/PLATELET
Abs Immature Granulocytes: 0.05 10*3/uL (ref 0.00–0.07)
Basophils Absolute: 0 10*3/uL (ref 0.0–0.1)
Basophils Relative: 0 %
Eosinophils Absolute: 0 10*3/uL (ref 0.0–0.5)
Eosinophils Relative: 0 %
HCT: 33.2 % — ABNORMAL LOW (ref 36.0–46.0)
Hemoglobin: 10.7 g/dL — ABNORMAL LOW (ref 12.0–15.0)
Immature Granulocytes: 1 %
Lymphocytes Relative: 6 %
Lymphs Abs: 0.6 10*3/uL — ABNORMAL LOW (ref 0.7–4.0)
MCH: 28.1 pg (ref 26.0–34.0)
MCHC: 32.2 g/dL (ref 30.0–36.0)
MCV: 87.1 fL (ref 80.0–100.0)
Monocytes Absolute: 0.4 10*3/uL (ref 0.1–1.0)
Monocytes Relative: 4 %
Neutro Abs: 8.8 10*3/uL — ABNORMAL HIGH (ref 1.7–7.7)
Neutrophils Relative %: 89 %
Platelets: 270 10*3/uL (ref 150–400)
RBC: 3.81 MIL/uL — ABNORMAL LOW (ref 3.87–5.11)
RDW: 14.6 % (ref 11.5–15.5)
WBC: 9.8 10*3/uL (ref 4.0–10.5)
nRBC: 0 % (ref 0.0–0.2)

## 2021-12-10 LAB — LACTIC ACID, PLASMA
Lactic Acid, Venous: 1.2 mmol/L (ref 0.5–1.9)
Lactic Acid, Venous: 1.3 mmol/L (ref 0.5–1.9)

## 2021-12-10 LAB — BLOOD GAS, ARTERIAL
Acid-base deficit: 24.7 mmol/L — ABNORMAL HIGH (ref 0.0–2.0)
Bicarbonate: 2.7 mmol/L — ABNORMAL LOW (ref 20.0–28.0)
Drawn by: 38235
O2 Saturation: 100 %
Patient temperature: 34.7
pCO2 arterial: <18 mmHg — CL (ref 32–48)
pH, Arterial: 7.12 — CL (ref 7.35–7.45)
pO2, Arterial: 123 mmHg — ABNORMAL HIGH (ref 83–108)

## 2021-12-10 LAB — GLUCOSE, CAPILLARY
Glucose-Capillary: 318 mg/dL — ABNORMAL HIGH (ref 70–99)
Glucose-Capillary: 332 mg/dL — ABNORMAL HIGH (ref 70–99)
Glucose-Capillary: 343 mg/dL — ABNORMAL HIGH (ref 70–99)
Glucose-Capillary: 280 mg/dL — ABNORMAL HIGH (ref 70–99)
Glucose-Capillary: 257 mg/dL — ABNORMAL HIGH (ref 70–99)
Glucose-Capillary: 250 mg/dL — ABNORMAL HIGH (ref 70–99)
Glucose-Capillary: 232 mg/dL — ABNORMAL HIGH (ref 70–99)
Glucose-Capillary: 200 mg/dL — ABNORMAL HIGH (ref 70–99)
Glucose-Capillary: 203 mg/dL — ABNORMAL HIGH (ref 70–99)
Glucose-Capillary: 192 mg/dL — ABNORMAL HIGH (ref 70–99)
Glucose-Capillary: 186 mg/dL — ABNORMAL HIGH (ref 70–99)
Glucose-Capillary: 159 mg/dL — ABNORMAL HIGH (ref 70–99)
Glucose-Capillary: 157 mg/dL — ABNORMAL HIGH (ref 70–99)
Glucose-Capillary: 148 mg/dL — ABNORMAL HIGH (ref 70–99)
Glucose-Capillary: 163 mg/dL — ABNORMAL HIGH (ref 70–99)

## 2021-12-10 LAB — COMPREHENSIVE METABOLIC PANEL
ALT: 10 U/L (ref 0–44)
AST: 10 U/L — ABNORMAL LOW (ref 15–41)
Albumin: 2.6 g/dL — ABNORMAL LOW (ref 3.5–5.0)
Alkaline Phosphatase: 68 U/L (ref 38–126)
BUN: 38 mg/dL — ABNORMAL HIGH (ref 6–20)
CO2: <7 mmol/L — ABNORMAL LOW (ref 22–32)
Calcium: 9.2 mg/dL (ref 8.9–10.3)
Chloride: 106 mmol/L (ref 98–111)
Creatinine, Ser: 1.32 mg/dL — ABNORMAL HIGH (ref 0.44–1.00)
GFR, Estimated: 49 mL/min — ABNORMAL LOW (ref >60)
Glucose, Bld: 341 mg/dL — ABNORMAL HIGH (ref 70–99)
Potassium: 2.8 mmol/L — ABNORMAL LOW (ref 3.5–5.1)
Sodium: 138 mmol/L (ref 135–145)
Total Bilirubin: 1.5 mg/dL — ABNORMAL HIGH (ref 0.3–1.2)
Total Protein: 5.6 g/dL — ABNORMAL LOW (ref 6.5–8.1)

## 2021-12-10 LAB — HEMOGLOBIN A1C
Hgb A1c MFr Bld: 8.1 % — ABNORMAL HIGH (ref 4.8–5.6)
Mean Plasma Glucose: 185.77 mg/dL

## 2021-12-10 LAB — HEPARIN LEVEL (UNFRACTIONATED)
Heparin Unfractionated: 0.45 IU/mL (ref 0.30–0.70)
Heparin Unfractionated: 0.38 IU/mL (ref 0.30–0.70)

## 2021-12-10 LAB — AMYLASE: Amylase: 316 U/L — ABNORMAL HIGH (ref 28–100)

## 2021-12-10 LAB — MAGNESIUM: Magnesium: 2.3 mg/dL (ref 1.7–2.4)

## 2021-12-10 LAB — PHOSPHORUS: Phosphorus: 2.3 mg/dL — ABNORMAL LOW (ref 2.5–4.6)

## 2021-12-10 LAB — ACETAMINOPHEN LEVEL: Acetaminophen (Tylenol), Serum: <10 ug/mL — ABNORMAL LOW (ref 10–30)

## 2021-12-10 LAB — APTT
aPTT: 44 seconds — ABNORMAL HIGH (ref 24–36)
aPTT: 50 seconds — ABNORMAL HIGH (ref 24–36)

## 2021-12-10 LAB — BETA-HYDROXYBUTYRIC ACID
Beta-Hydroxybutyric Acid: >8 mmol/L — ABNORMAL HIGH (ref 0.05–0.27)
Beta-Hydroxybutyric Acid: >8 mmol/L — ABNORMAL HIGH (ref 0.05–0.27)
Beta-Hydroxybutyric Acid: 0.95 mmol/L — ABNORMAL HIGH (ref 0.05–0.27)

## 2021-12-10 LAB — TROPONIN I (HIGH SENSITIVITY)
Troponin I (High Sensitivity): 43 ng/L — ABNORMAL HIGH (ref <18)
Troponin I (High Sensitivity): 42 ng/L — ABNORMAL HIGH (ref <18)

## 2021-12-10 LAB — MRSA NEXT GEN BY PCR, NASAL: MRSA by PCR Next Gen: NOT DETECTED

## 2021-12-10 LAB — SALICYLATE LEVEL: Salicylate Lvl: <7 mg/dL — ABNORMAL LOW (ref 7.0–30.0)

## 2021-12-10 LAB — LIPASE, BLOOD: Lipase: 341 U/L — ABNORMAL HIGH (ref 11–51)

## 2021-12-10 LAB — PROCALCITONIN: Procalcitonin: <0.1 ng/mL

## 2021-12-10 LAB — OSMOLALITY: Osmolality: 329 mOsm/kg (ref 275–295)

## 2021-12-10 LAB — BRAIN NATRIURETIC PEPTIDE: B Natriuretic Peptide: 311.6 pg/mL — ABNORMAL HIGH (ref 0.0–100.0)

## 2021-12-10 LAB — CK: Total CK: 57 U/L (ref 38–234)

## 2021-12-10 SURGERY — OPEN REDUCTION INTERNAL FIXATION (ORIF) DISTAL FEMUR FRACTURE
Anesthesia: General | Laterality: Right

## 2021-12-10 MED ORDER — PIPERACILLIN-TAZOBACTAM 3.375 G IVPB
3.3750 g | Freq: Three times a day (TID) | INTRAVENOUS | Status: DC
  Administered 2021-12-10 – 2021-12-11 (×4): 3.375 g via INTRAVENOUS
  Filled 2021-12-10 (×6): qty 50

## 2021-12-10 MED ORDER — POTASSIUM CHLORIDE 10 MEQ/100ML IV SOLN
10.0000 meq | INTRAVENOUS | Status: DC
  Filled 2021-12-10: qty 100

## 2021-12-10 MED ORDER — LACTATED RINGERS IV SOLN
INTRAVENOUS | Status: DC
  Administered 2021-12-10: 125 mL/h via INTRAVENOUS

## 2021-12-10 MED ORDER — VANCOMYCIN HCL 1250 MG/250ML IV SOLN: 1250.0000 mg | INTRAVENOUS | Status: DC

## 2021-12-10 MED ORDER — VANCOMYCIN HCL IN DEXTROSE 1-5 GM/200ML-% IV SOLN
1000.0000 mg | Freq: Once | INTRAVENOUS | Status: AC
  Administered 2021-12-10: 1000 mg via INTRAVENOUS
  Filled 2021-12-10: qty 200

## 2021-12-10 MED ORDER — HEPARIN SODIUM (PORCINE) 5000 UNIT/ML IJ SOLN: 5000.0000 [IU] | Freq: Three times a day (TID) | INTRAMUSCULAR | Status: DC

## 2021-12-10 MED ORDER — DEXTROSE 50 % IV SOLN
0.0000 mL | INTRAVENOUS | Status: DC | PRN
  Filled 2021-12-10: qty 50

## 2021-12-10 MED ORDER — IPRATROPIUM-ALBUTEROL 0.5-2.5 (3) MG/3ML IN SOLN
3.0000 mL | Freq: Four times a day (QID) | RESPIRATORY_TRACT | Status: DC
  Administered 2021-12-10 (×2): 3 mL via RESPIRATORY_TRACT
  Filled 2021-12-10 (×3): qty 3

## 2021-12-10 MED ORDER — LACTATED RINGERS IV BOLUS
1000.0000 mL | Freq: Once | INTRAVENOUS | Status: AC
  Administered 2021-12-10: 1000 mL via INTRAVENOUS

## 2021-12-10 MED ORDER — HEPARIN (PORCINE) 25000 UT/250ML-% IV SOLN
1400.0000 [IU]/h | INTRAVENOUS | Status: DC
  Administered 2021-12-10: 600 [IU]/h via INTRAVENOUS
  Administered 2021-12-11: 950 [IU]/h via INTRAVENOUS
  Administered 2021-12-12 – 2021-12-13 (×2): 1200 [IU]/h via INTRAVENOUS
  Administered 2021-12-14: 1350 [IU]/h via INTRAVENOUS
  Filled 2021-12-10 (×5): qty 250

## 2021-12-10 MED ORDER — SODIUM CHLORIDE 0.9 % IV BOLUS
1000.0000 mL | INTRAVENOUS | Status: AC
  Administered 2021-12-10: 1000 mL via INTRAVENOUS

## 2021-12-10 MED ORDER — MEDIHONEY WOUND/BURN DRESSING EX PSTE
1.0000 | PASTE | Freq: Every day | CUTANEOUS | Status: DC
  Administered 2021-12-10 – 2021-12-18 (×8): 1 via TOPICAL
  Filled 2021-12-10 (×2): qty 44

## 2021-12-10 MED ORDER — POTASSIUM CHLORIDE 10 MEQ/100ML IV SOLN
10.0000 meq | INTRAVENOUS | Status: DC
  Administered 2021-12-10 (×2): 10 meq via INTRAVENOUS
  Filled 2021-12-10 (×2): qty 100

## 2021-12-10 MED ORDER — SODIUM BICARBONATE 8.4 % IV SOLN
INTRAVENOUS | Status: DC
  Filled 2021-12-10: qty 1000

## 2021-12-10 MED ORDER — POTASSIUM CHLORIDE 10 MEQ/100ML IV SOLN
10.0000 meq | INTRAVENOUS | Status: AC
  Administered 2021-12-10 (×6): 10 meq via INTRAVENOUS
  Filled 2021-12-10 (×5): qty 100

## 2021-12-10 MED ORDER — INSULIN ASPART 100 UNIT/ML IJ SOLN
0.0000 [IU] | INTRAMUSCULAR | Status: DC
  Administered 2021-12-10: 7 [IU] via SUBCUTANEOUS

## 2021-12-10 MED ORDER — PANTOPRAZOLE SODIUM 40 MG IV SOLR
40.0000 mg | Freq: Every day | INTRAVENOUS | Status: DC
  Administered 2021-12-10 – 2021-12-13 (×4): 40 mg via INTRAVENOUS
  Filled 2021-12-10 (×4): qty 10

## 2021-12-10 MED ORDER — LACTATED RINGERS IV BOLUS
20.0000 mL/kg | Freq: Once | INTRAVENOUS | Status: AC
  Administered 2021-12-10: 958 mL via INTRAVENOUS

## 2021-12-10 MED ORDER — THIAMINE HCL 100 MG/ML IJ SOLN
100.0000 mg | Freq: Every day | INTRAMUSCULAR | Status: DC
  Administered 2021-12-10 – 2021-12-12 (×3): 100 mg via INTRAVENOUS
  Filled 2021-12-10 (×3): qty 2

## 2021-12-10 MED ORDER — SODIUM BICARBONATE 8.4 % IV SOLN
100.0000 meq | Freq: Once | INTRAVENOUS | Status: AC
  Administered 2021-12-10: 100 meq via INTRAVENOUS
  Filled 2021-12-10: qty 100

## 2021-12-10 MED ORDER — ALBUMIN HUMAN 25 % IV SOLN
12.5000 g | INTRAVENOUS | Status: AC
  Administered 2021-12-10: 12.5 g via INTRAVENOUS
  Filled 2021-12-10: qty 50

## 2021-12-10 MED ORDER — FOLIC ACID 5 MG/ML IJ SOLN
1.0000 mg | Freq: Every day | INTRAMUSCULAR | Status: DC
  Administered 2021-12-10 – 2021-12-12 (×3): 1 mg via INTRAVENOUS
  Filled 2021-12-10 (×3): qty 0.2

## 2021-12-10 MED ORDER — IPRATROPIUM-ALBUTEROL 0.5-2.5 (3) MG/3ML IN SOLN: 3.0000 mL | Freq: Four times a day (QID) | RESPIRATORY_TRACT | Status: DC | PRN

## 2021-12-10 MED ORDER — NOREPINEPHRINE 4 MG/250ML-% IV SOLN
2.0000 ug/min | INTRAVENOUS | Status: DC
  Administered 2021-12-10: 2 ug/min via INTRAVENOUS
  Filled 2021-12-10: qty 250

## 2021-12-10 MED ORDER — POTASSIUM CHLORIDE 10 MEQ/100ML IV SOLN
10.0000 meq | INTRAVENOUS | Status: DC
  Administered 2021-12-10 (×4): 10 meq via INTRAVENOUS
  Filled 2021-12-10 (×4): qty 100

## 2021-12-10 MED ORDER — LACTATED RINGERS IV BOLUS
500.0000 mL | Freq: Once | INTRAVENOUS | Status: AC
  Administered 2021-12-10: 500 mL via INTRAVENOUS

## 2021-12-10 MED ORDER — DEXTROSE IN LACTATED RINGERS 5 % IV SOLN
INTRAVENOUS | Status: DC
  Administered 2021-12-10: 125 mL/h via INTRAVENOUS

## 2021-12-10 MED ORDER — CHLORHEXIDINE GLUCONATE CLOTH 2 % EX PADS
6.0000 | MEDICATED_PAD | Freq: Every day | CUTANEOUS | Status: DC
  Administered 2021-12-10 – 2021-12-18 (×9): 6 via TOPICAL

## 2021-12-10 MED ORDER — INSULIN REGULAR(HUMAN) IN NACL 100-0.9 UT/100ML-% IV SOLN
INTRAVENOUS | Status: DC
  Administered 2021-12-10: 7 [IU]/h via INTRAVENOUS
  Filled 2021-12-10 (×2): qty 100

## 2021-12-10 MED ORDER — BUDESONIDE 0.25 MG/2ML IN SUSP
0.2500 mg | Freq: Two times a day (BID) | RESPIRATORY_TRACT | Status: DC
  Administered 2021-12-10 – 2021-12-15 (×9): 0.25 mg via RESPIRATORY_TRACT
  Filled 2021-12-10 (×10): qty 2

## 2021-12-10 MED ORDER — POTASSIUM CHLORIDE 10 MEQ/100ML IV SOLN
10.0000 meq | INTRAVENOUS | Status: AC
  Administered 2021-12-10 – 2021-12-11 (×4): 10 meq via INTRAVENOUS
  Filled 2021-12-10 (×4): qty 100

## 2021-12-10 MED ORDER — POTASSIUM CHLORIDE 10 MEQ/100ML IV SOLN: 10.0000 meq | INTRAVENOUS | Status: DC

## 2021-12-10 MED ORDER — SODIUM CHLORIDE 0.9 % IV SOLN
250.0000 mL | INTRAVENOUS | Status: DC
  Administered 2021-12-10 (×2): 250 mL via INTRAVENOUS

## 2021-12-10 NOTE — Progress Notes (Signed)
ANTICOAGULATION CONSULT NOTE - Initial Consult  Pharmacy Consult for Heparin (Apixaban on hold) Indication:  PAD  No Known Allergies  Patient Measurements: Height: '5\' 6"'$  (167.6 cm) Weight: 47.9 kg (105 lb 9.6 oz) IBW/kg (Calculated) : 59.3  Vital Signs: Temp: 96.2 F (35.7 C) (11/16 0333) Temp Source: Axillary (11/16 0333) BP: 74/54 (11/16 0228) Pulse Rate: 77 (11/16 0228)  Labs: Recent Labs    12/08/21 1239 12/10/21 0158  HGB 12.0 10.7*  HCT 37.0 33.2*  PLT 304 270  CREATININE 0.74 1.32*  CKTOTAL  --  57  TROPONINIHS  --  43*    Estimated Creatinine Clearance: 37.7 mL/min (A) (by C-G formula based on SCr of 1.32 mg/dL (H)).   Medical History: Past Medical History:  Diagnosis Date   Coronary artery disease    Diabetes mellitus without complication (HCC)    Diabetes mellitus, type 2 (HCC)    HFrEF (heart failure with reduced ejection fraction) ([place])    Hyperlipemia    Hypertension    Ischemic cardiomyopathy    Orthostatic hypotension    PAD (peripheral artery disease) ([place])    Tobacco use      Assessment: 51 y/o F s/p fall, tx from [facility] to [facility], had hypotension requiring transfer to the ICU, holding apixaban and starting heparin, Hgb 10.7, other labs above reviewed. Anticipate using aPTT to dose for now.   Goal of Therapy:  Heparin level 0.3-0.7 units/ml aPTT 66-102 seconds Monitor platelets by anticoagulation protocol: Yes   Plan:  Start heparin drip at 600 units/hr 1200 heparin level and aPTT Daily CBC, heparin level, and aPTT Monitor for bleeding  Narda Bonds, PharmD, BCPS Clinical Pharmacist Phone: [phone]

## 2021-12-10 NOTE — Progress Notes (Addendum)
NAME:  Stephany Poorman, MRN:  [id number], DOB:  02-Dec-1970, LOS: 1 ADMISSION DATE:  12/09/2021, CONSULTATION DATE:  12/10/2021 REFERRING MD:  TRH, CHIEF COMPLAINT:  septic shock, acidosis   History of Present Illness:  51 year old female who reportedly has lost over 100 pounds of weight according to the daughter's boyfriend with significant failure to thrive, severe cachexia smoker quit 1 year ago and COPD not otherwise specified but not on oxygen, known chronic systolic heart failure EF 45% [at least since 2022], poorly controlled diabetes [hemoglobin A1c 12.9 in July 2023], hypertension, hyperlipidemia.  Since summer 2023 she is got chronic right  heel ischemic ulceration and is status post thrombolysis and thrombectomy for lower lobe claudication in July 2023.  Discovered to have benign meningioma occipital lobe in July 2023.   Admitted to [place] ER 12/08/2021 after a fall on 12/02/2021 and right knee pain.  Patient then spent the next few days in a recliner and upon EMS arrival was found to be saturated in urine.  In the ER was discovered to have right femoral condylar fracture along with abnormal urine analysis consistent with UTI. Upon arrival found to be alert with normal vital signs.  But also found to have stage I-2 wound of the sacrum.  Patient waited at [facility] ER for a bed at [facility].  She was sitting pain control and treatment for UTI with ceftriaxone.  During this time family was concerned for patient suffering dehydration.   At the time of transfer to [facility] patient was found to be hypothermic And sleepy and weak.  Fluids was initiated and patient transferred to the orthopedic floor at [facility].  Subsequently patient found to be hypotensive and persistently hypothermic.  ABG showed severe metabolic acidosis and CCM was consulted.  At the time of CCM evaluation patient did receive 1.5 L of fluid in total    Pertinent  Medical History  Type 2 DM, HFrEF, PAD s/p  peripheral stenting, tobacco use disorder  Significant Hospital Events: Including procedures, antibiotic start and stop dates in addition to other pertinent events   11/15 admitted [facility] for fracture repair 11/15 overnight transferred from [facility] med surg to ICU for hypotension, acidosis, lethargy  Interim History / Subjective:  Overnight progressively more lethargic, hypotensive on pressors  Objective   Blood pressure 116/62, pulse 83, temperature 98.2 F (36.8 C), temperature source Axillary, resp. rate 19, height '5\' 6"'$  (1.676 m), weight 47.9 kg, SpO2 99 %.        Intake/Output Summary (Last 24 hours) at 12/10/2021 5397 Last data filed at 12/10/2021 6734 Gross per 24 hour  Intake 1671.32 ml  Output 500 ml  Net 1171.32 ml   Filed Weights   12/10/21 0228  Weight: 47.9 kg    Examination: General: acutely and chronically ill appearing, older than stated age HENT: dry mucus membranes Lungs: tachynpic, shallow respirations, no wheeze Cardiovascular: tachycardic, regular Abdomen: scaphoid, soft Extremities: thin, no edema Neuro: not responsive, moves extremities purposefully   Labs reviewed Hyperglycemic Persistent acidosis Lactic acid low BHB>8 Bicarb 7 pH on ABG 7.1 K 2.1 Cr 1.45LIpase 341  UA with pyuria, bacteria, glycose, protein Urine cx 11/14 shows e. coli  Resolved Hospital Problem list     Assessment & Plan:   Cia Garretson is a 51 y.o. woman with PAD, tobacco use disorder, Type 2 DM on Farxiga who presents with:  Anion gap metabolic acidosis Type 2 DM Euglycemic DKA secondary to  Farxiga Mild lipase elevation - urgent fluid resuscitation and initiation of insulin gtt - placed on DKA orderset with Q4 BMET, made NPO,  - stop bicarb drip as pH >6.9, need to treat underlying cause with fluids, insulin gtt  Hypokalemia AKI Septic shock secondary to UTI - e. Coli shown in cultures - continue zosyn, can stop vanco -  aggressive K supplementation - titrate down pressors as tolerated with fluid resuscitation goal MAP>65 - renal protective measures  Acute metabolic encephalopathy - secondary to sepsis, acidosis - treatment as above  Chronic HFrEF EF 40% HTN PAD on systemic eliquis - dehydrated on exam, monitor fluid status closely - holding home meds for hypertension - heparin gtt for PAD  Tobacco use disorder COPD - not in exacerbation  Femoral fracture - will need ortho input once acute illness resolves  Best Practice (right click and "Reselect all SmartList Selections" daily)   Diet/type: NPO DVT prophylaxis: systemic heparin for PAD GI prophylaxis: N/A Lines: N/A Foley:  N/A Code Status:  full code Last date of multidisciplinary goals of care discussion [pending]   The patient is critically ill due to acidosis, shock.  Critical care was necessary to treat or prevent imminent or life-threatening deterioration.  Critical care was time spent personally by me on the following activities: development of treatment plan with patient and/or surrogate as well as nursing, discussions with consultants, evaluation of patient's response to treatment, examination of patient, obtaining history from patient or surrogate, ordering and performing treatments and interventions, ordering and review of laboratory studies, ordering and review of radiographic studies, pulse oximetry, re-evaluation of patient's condition and participation in multidisciplinary rounds.   Critical Care Time devoted to patient care services described in this note is 45 minutes (additional to what was already delivered on this day). This time reflects time of care of this [place] . This critical care time does not reflect separately billable procedures or procedure time, teaching time or supervisory time of PA/NP/Med student/Med Resident etc but could involve care discussion time.       Spero Geralds  Pulmonary  and Critical Care Medicine 12/10/2021 9:12 AM  Pager: see AMION  If no response to pager , please call critical care on call (see AMION) until 7pm After 7:00 pm call [place]

## 2021-12-10 NOTE — Progress Notes (Signed)
Lower extremity venous has been completed.   Preliminary results in CV Proc.   Beth Carroll Due 12/10/2021 2:01 PM

## 2021-12-10 NOTE — Progress Notes (Signed)
Date and time results received: 12/10/21 0800 (use smartphrase ".now" to insert current time)  Test: K Critical Value: 2.1  Name of Provider Notified: desai  Orders Received? Or Actions Taken?: yes Actions Taken: runs of K

## 2021-12-10 NOTE — Progress Notes (Signed)
[place] Progress Note Patient Name: Beth Carroll DOB: 1970/12/08 MRN: [id number]   Date of Service  12/10/2021  HPI/Events of Note  Hypokalemia - K+ = 2.8 and Creatinine = 1.32.   eICU Interventions  Plan: Will replace K+. Repeat BMP at 12 noon.      Intervention Category Major Interventions: Electrolyte abnormality - evaluation and management  Doriana Mazurkiewicz Eugene 12/10/2021, 4:19 AM

## 2021-12-10 NOTE — Progress Notes (Signed)
Critical ABG results received from lab. Ph: 7.12, Co2 <18, PO2 123, HCO3 2.7. Rapid Response made aware.

## 2021-12-10 NOTE — Progress Notes (Signed)
Attempted to have pt swallow, pt failed swallowing test.

## 2021-12-10 NOTE — Significant Event (Addendum)
Rapid Response Event Note   Reason for Call : hypothermia, lethargy Initial Focused Assessment:  I was notified of pts temperature on arrival and soft BP. Dr. Nevada Crane came to bedside to see pt earlier. Upon my arrival, pt is lethargic but arousable, follows commands. Skin is warm, pink and dry. Urine output unknown. ABG and lactic acid ordered. LR bolus ordered for BP 80/55. Pt unable to swallow meds safely at this time. Bair hugger warming pt.   2355-94.51F rectally, HR 73 NSR, 80/55, RR 16 with sats 99% on RA.   0043-HR 81, 89/53(65), RR 22 , sats 99% on RA  Interventions:  -LR bolus 500cc -ABG, Lactic acid  Plan of Care:  PCCM consulted for tx to ICU  Addendum: ABG 7.12/<18/123/2.7  MD Notified: Dr. Nevada Crane Call Time: 2333 Arrival Time: 2340 End Time: 0230  Madelynn Done, RN

## 2021-12-10 NOTE — Inpatient Diabetes Management (Addendum)
Inpatient Diabetes Program Recommendations  AACE/ADA: New Consensus Statement on Inpatient Glycemic Control (2015)  Target Ranges:  Prepandial:   less than 140 mg/dL      Peak postprandial:   less than 180 mg/dL (1-2 hours)      Critically ill patients:  140 - 180 mg/dL   Lab Results  Component Value Date   GLUCAP 343 (H) 12/10/2021   HGBA1C 8.1 (H) 12/10/2021    Latest Reference Range & Units 12/10/21 06:57  Sodium 135 - 145 mmol/L 135  Potassium 3.5 - 5.1 mmol/L 2.1 (LL)  Chloride 98 - 111 mmol/L 107  CO2 22 - 32 mmol/L <7 (L)  Glucose 70 - 99 mg/dL 351 (H)  BUN 6 - 20 mg/dL 34 (H)  Creatinine 0.44 - 1.00 mg/dL 1.45 (H)  Calcium 8.9 - 10.3 mg/dL 8.7 (L)  Anion gap 5 - 15  NOT CALCULATED   Diabetes history: DM2 Outpatient Diabetes medications: Lantus 25 units qd, Ozempic 0.5 mg q week, Farxiga 10 mg qd Current orders for Inpatient glycemic control: IV insulin  Inpatient Diabetes Program Recommendations:   Noted patient was seen while inpatient on 08/25/21 and discussed elevated A1c of 12.9 and patient to followup with endocrinologist.  Patient is now on Farxiga @ home and may have increased risk of DKA if not eating and eating well. K+ 2.1 and K+ supplementation in process. Will follow during hospitalization.  Discussed patient progress and treatment with Esmeralda Arthur, pharmacist.  Thank you, Nani Gasser. Skyelar Swigart, RN, MSN, CDE  Diabetes Coordinator Inpatient Glycemic Control Team Team Pager [phone] (8am-5pm) 12/10/2021 9:33 AM

## 2021-12-10 NOTE — Progress Notes (Signed)
Date and time results received: 12/10/21 0915  (use smartphrase ".now" to insert current time)  Test: k Critical Value: 2.4  Name of Provider Notified: desai   Orders Received? Or Actions Taken?: Actions Taken: Give more K

## 2021-12-10 NOTE — Progress Notes (Signed)
Report given to [facility] [phone],

## 2021-12-10 NOTE — Progress Notes (Signed)
ANTICOAGULATION CONSULT NOTE - Follow Up Consult  Pharmacy Consult for Heparin (Apixaban on hold) Indication:  PAD  No Known Allergies  Patient Measurements: Height: '5\' 6"'$  (167.6 cm) Weight: 47.9 kg (105 lb 9.6 oz) IBW/kg (Calculated) : 59.3 Heparin dosing weight: 47.9 kg   Vital Signs: Temp: 97.7 F (36.5 C) (11/16 1900) Temp Source: Oral (11/16 1900) BP: 104/56 (11/16 2100) Pulse Rate: 88 (11/16 2100)  Labs: Recent Labs    12/08/21 1239 12/10/21 0158 12/10/21 0350 12/10/21 0657 12/10/21 1158 12/10/21 1536 12/10/21 1902 12/10/21 2055  HGB 12.0 10.7*  --   --   --   --   --   --   HCT 37.0 33.2*  --   --   --   --   --   --   PLT 304 270  --   --   --   --   --   --   APTT  --   --   --   --  44*  --   --  50*  HEPARINUNFRC  --   --   --   --  0.45  --   --  0.38  CREATININE 0.74 1.32* 1.25*   < > 1.16* 0.80 0.72  --   CKTOTAL  --  57  --   --   --   --   --   --   TROPONINIHS  --  43* 42*  --   --   --   --   --    < > = values in this interval not displayed.     Estimated Creatinine Clearance: 62.2 mL/min (by C-G formula based on SCr of 0.72 mg/dL).   Medical History: Past Medical History:  Diagnosis Date   Coronary artery disease    Diabetes mellitus without complication (HCC)    Diabetes mellitus, type 2 (HCC)    HFrEF (heart failure with reduced ejection fraction) ([place])    Hyperlipemia    Hypertension    Ischemic cardiomyopathy    Orthostatic hypotension    PAD (peripheral artery disease) ([place])    Tobacco use      Assessment: 51 y/o F s/p fall, tx from [facility] to [facility], had hypotension requiring transfer to the ICU, holding apixaban and starting heparin, Hgb 10.7, other labs above reviewed.   Patient has not received Eliquis since admission to [facility] at approximately 11/14 around midnight (unclear last dose, not collected in med rec). Patient's heparin level 0.38, aPTT continues to be subtherapeutic at 50. Anticipate that heparin level is falsely elevated  due to recent DOAC intake. Will use PTT to dose heparin for now. No bleeding or IV issues noted.   Goal of Therapy:  Heparin level 0.3-0.7 units/ml aPTT 66-102 seconds Monitor platelets by anticoagulation protocol: Yes   Plan:  Increase heparin to 850 units/hr  Recheck heparin level and aPTT in 8 hours.  Monitor for S/SX of bleeding.   Erin Hearing PharmD., BCPS Clinical Pharmacist 12/10/2021 9:46 PM

## 2021-12-10 NOTE — Consult Note (Addendum)
NAME:  Beth Carroll, MRN:  [id number], DOB:  1970/07/18, LOS: 1 ADMISSION DATE:  12/09/2021, CONSULTATION DATE:  12/10/21 REFERRING MD:  Dr Aggie Moats of [facility], CHIEF COMPLAINT:  Septic shock,  PCP Baxter Hire, MD AddresS: [address] [place] [address]    History of Present Illness: Bedside nursing,-daughter's boyfriend and also review of the records and hospitalist  51 year old female who reportedly has lost over 100 pounds of weight according to the daughter's boyfriend with significant failure to thrive, severe cachexia smoker quit 1 year ago and COPD not otherwise specified but not on oxygen, known chronic systolic heart failure EF 45% [at least since 2022], poorly controlled diabetes [hemoglobin A1c 12.9 in July 2023], hypertension, hyperlipidemia.  Since summer 2023 she is got chronic right  heel ischemic ulceration and is status post thrombolysis and thrombectomy for lower lobe claudication in July 2023.  Discovered to have benign meningioma occipital lobe in July 2023.  Admitted to [facility] 12/08/2021 after a fall on 12/02/2021 and right knee pain.  Patient then spent the next few days in a recliner and upon EMS arrival was found to be saturated in urine.  In the ER was discovered to have right femoral condylar fracture along with abnormal urine analysis consistent with UTI. Upon arrival found to be alert with normal vital signs.  But also found to have stage I-2 wound of the sacrum.  Patient waited at [facility] ER for a bed at [facility].  She was sitting pain control and treatment for UTI with ceftriaxone.  During this time family was concerned for patient suffering dehydration.  At the time of transfer to [facility] patient was found to be hypothermic And sleepy and weak.  Fluids was initiated and patient transferred to the orthopedic floor at [facility].  Subsequently patient found to be hypotensive and persistently hypothermic.  ABG showed  severe metabolic acidosis and CCM was consulted.  At the time of CCM evaluation patient did receive 1.5 L of fluid in total   PAST      has a past medical history of Coronary artery disease, Diabetes mellitus without complication ([place]), Diabetes mellitus, type 2 ([place]), HFrEF (heart failure with reduced ejection fraction) ([place]), Hyperlipemia, Hypertension, Ischemic cardiomyopathy, Orthostatic hypotension, PAD (peripheral artery disease) ([place]), and Tobacco use.   reports that she quit smoking about a year ago. Her smoking use included cigarettes. She has never used smokeless tobacco.  Past Surgical History:  Procedure Laterality Date   CORONARY STENT INTERVENTION N/A 12/15/2020   Procedure: CORONARY STENT INTERVENTION;  Surgeon: Wellington Hampshire, MD;  Location: [place] CV LAB;  Service: Cardiovascular;  Laterality: N/A;   LEFT HEART CATH AND CORONARY ANGIOGRAPHY N/A 12/15/2020   Procedure: LEFT HEART CATH AND CORONARY ANGIOGRAPHY;  Surgeon: Wellington Hampshire, MD;  Location: [place] CV LAB;  Service: Cardiovascular;  Laterality: N/A;   LOWER EXTREMITY ANGIOGRAPHY Left 12/10/2020   Procedure: Lower Extremity Angiography;  Surgeon: Algernon Huxley, MD;  Location: [place] CV LAB;  Service: Cardiovascular;  Laterality: Left;   LOWER EXTREMITY ANGIOGRAPHY Right 12/25/2020   Procedure: LOWER EXTREMITY ANGIOGRAPHY;  Surgeon: Algernon Huxley, MD;  Location: [place] CV LAB;  Service: Cardiovascular;  Laterality: Right;   LOWER EXTREMITY ANGIOGRAPHY Right 08/19/2021   Procedure: Lower Extremity Angiography;  Surgeon: Algernon Huxley, MD;  Location: [place] CV LAB;  Service: Cardiovascular;  Laterality: Right;   LOWER EXTREMITY ANGIOGRAPHY Right 08/19/2021  Procedure: Lower Extremity Angiography;  Surgeon: Algernon Huxley, MD;  Location: [place] CV LAB;  Service: Cardiovascular;  Laterality: Right;    No Known Allergies   There is no immunization history on file for this  patient.  Family History  Problem Relation Age of Onset   Hypertension Father      Current Facility-Administered Medications:    0.9 %  sodium chloride infusion, 250 mL, Intravenous, Continuous, Baxter Gonzalez, MD   acetaminophen (TYLENOL) tablet 650 mg, 650 mg, Oral, Q6H PRN, Hall, Carole N, DO   albumin human 25 % solution 12.5 g, 12.5 g, Intravenous, STAT, Hall, Carole N, DO   budesonide (PULMICORT) nebulizer solution 0.25 mg, 0.25 mg, Nebulization, BID, Brand Males, MD   Chlorhexidine Gluconate Cloth 2 % PADS 6 each, 6 each, Topical, Daily, Brand Males, MD, 6 each at 12/10/21 0219   HYDROmorphone (DILAUDID) injection 0.5 mg, 0.5 mg, Intravenous, Q4H PRN, Hall, Carole N, DO   insulin aspart (novoLOG) injection 0-9 Units, 0-9 Units, Subcutaneous, Q4H, Salia Cangemi, MD   ipratropium-albuterol (DUONEB) 0.5-2.5 (3) MG/3ML nebulizer solution 3 mL, 3 mL, Nebulization, Q6H, Carlee Tesfaye, MD   lactated ringers bolus 1,000 mL, 1,000 mL, Intravenous, Once, Brand Males, MD   midodrine (PROAMATINE) tablet 10 mg, 10 mg, Oral, STAT, Hall, Carole N, DO   norepinephrine (LEVOPHED) '4mg'$  in 251m (0.016 mg/mL) premix infusion, 2-10 mcg/min, Intravenous, Titrated, Ashanty Coltrane, MD   piperacillin-tazobactam (ZOSYN) IVPB 3.375 g, 3.375 g, Intravenous, Q8H, Ledford, James L, RPH   polyethylene glycol (MIRALAX / GLYCOLAX) packet 17 g, 17 g, Oral, Daily PRN, Hall, Carole N, DO   potassium chloride 10 mEq in 100 mL IVPB, 10 mEq, Intravenous, Q1 Hr x 2, Hall, Carole N, DO   sodium bicarbonate 150 mEq in dextrose 5 % 1,150 mL infusion, , Intravenous, Continuous, Taim Wurm, MD   vancomycin (VANCOCIN) IVPB 1000 mg/200 mL premix, 1,000 mg, Intravenous, Once, LErenest Blank [facility]:  12/09/2021 - admit 12/10/21 - CCM consult  Interim History / Subjective:   12/10/2021 - seen in 5n12  Objective   Blood pressure (!) 89/53, pulse 68,  temperature (!) 94.5 F (34.7 C), temperature source Rectal, resp. rate 15, SpO2 100 %.       No intake or output data in the 24 hours ending 12/10/21 0219 There were no vitals filed for this visit.  Examination: General: very frail. Cachectic, looks unwell . SPARSE THIN HAIR , CACHEXIA HENT: No elevated JVP. No neck nodes Lungs: Barrell chest, hyper resonance,. Diffuse muscle wasting.No wheeze Cardiovascular: Normal heart soudns Abdomen: scaphid, wasted, Lower abd vertical incisional scar + Extremities: intact. RLE Heel ulcer +. Diffuse muscle wsating Neuro: Sleey and respnds to painful stimuli but protecting airway. Lethargic GU: not examined . 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   on tramadol Acute obtunded metabolic encephalopathy - absent upon ER arrival but [facility]   P:   Dilaudid as needed for pain Supportive sepsis  treatment and monitor    Essential hypertension - Prior to & Present on Admit; on carvedilol, losartan Hyperlipidemia - Prior to & Present on Admit; on Lipitor Atherosclerosis of the right lower extremity with claudication/rest pain  -Status post [08/19/2021] therapeutic thrombolysis and mechanical thrombectomy of the right SFA popliteal artery and stent placement to the right popliteal artery and right SFA - On Eliquis and Asa since then  Circulatory septic shock -present on admit to [facility] floor    P:  Fluid hydration followed by bicarbonate infusion for acidosis Levophed through peripheral line if  needed MAP goal greater than 65 Check sepsis biomarkers Hold carvedilol and losartan IV heparin In lieu of eliquis  Coronary artery disease with ischemic cardiomyopathy Chronic systolic heart failure -EF 45%; Prior to & Present on Admit -as of November 2022   P: Get echocardiogram Cycle cardiac enzymes Check BNP   Normal sinus rhythm with left axis deviation -baseline EKG January 2023   P: Telemetry monitoring Get twelve-lead EKG For QTc   Suspected UTI 12/08/21 with severe sepsis 12/09/21  - Present on Admit; based on UA with pyuria hematuria and bacteria - Farxiga as risk factor Hypothermia 12/09/21 - arrival to [facility]   P:    Check procalcitonin and lactic acid Await blood culture results Await urine culture results Stop ceftriaxone Start vancomycin Start Zosyn Bair hugger    AKI with admit creatinine of 0.7 mg percent [baseline creatinine 0.34-0.4 mg percent]  P:  Avoid nephrotoxin Maintain hemodynamics Monitor with sepsis resuscitation    Suspect Starvation Keto-acidosis +/- compounded by Wilder Glade (SGLT2 inhibitor)  - Present on Admit  - severe cachxeia and weight loss x 1 year pre-admit  - On farxiga pre-admit - Elevated anion gap 21 - arrival to ER  - Positive Urine ketons - arrival to ER  - Severe metabolic acidosis with very mild hyperglycemia  -  arrival to [facility]  P D5 with bicarb gtt at 100cc/h Check CK for rhabdo Check BHOB Check tylenol and sal level Check sr osm Check amylase/lipase Check BMET Q4h and track anion gap   :  Mild hypokalemia in ER 12/08/21  P: Monitor and replete accordingly AT risk for refeeding syndrome     At risk for diabetic gastroparesis At risk for Thiamine deficiency   P:   PPI MVT Thiamine  Folic acidis NPO for now but use dextrose fluids Carbohydrate diet when awake    Chronic mild anemia hgb - hgb 11-12gm%  P:  - PRBC for hgb </= 6.9gm%    - exceptions are   -  if ACS  susepcted/confirmed then transfuse for hgb </= 8.0gm%,  or    -  active bleeding with hemodynamic instability, then transfuse regardless of hemoglobin value   At at all times try to transfuse 1 unit prbc as possible with exception of active hemorrhage      Poorly controlled type 2 diabetes [hemoglobin A1c 12.9 in July  2023]- Prior to & Present on Admit ; on Farxiga, insulin and semaglutide   P:   SSI Check HgbA1c If needed do insulin gtt No farxiga   Chronic right heel ischemic ulceration; developed summer 2023-   -Follows with Boneta Lucks  -Documented as 1.4 cm in October 2023 Unintentional weight loss greater than 100 pounds x 1 year] with significant failure to thrive - Present on Admit Severe cachexia (BMI 19) and malnutrition - Present on Admit Admitted with right femoral condyle fracture following fall- Present on Admit  Stage I-2 sacral decub; Present on Admit   Plan  - Nutrition consultl; anticipate refeeding syndrome -Cannot go for femur surgery until medically stabilized; Dr Alvan Dame needs updst -We will need osteoporosis evaluation as outpatient - get Duplex LE rule out DVT   Best practice (daily eval):  Diet: npo due to encephalopathy but Rx with D5 fluids Pain/Anxiety/Delirium protocol (if indicated): dilaudid prn VAP protocol (if indicated): x DVT prophylaxis: hpearin IV (home eliquids for PAD) GI prophylaxis: protonix Glucose control: ssi Mobility: bed rest Code Status: full code Family Communication: daughters boy friend uopdaed outside 3n12 Disposition: move to [place]   The patient Beth Carroll is critically ill with multiple organ systems failure and requires high complexity decision making for assessment and support, frequent evaluation and titration of therapies, application of advanced monitoring technologies and extensive interpretation of multiple databases.   Critical Care Time devoted to patient care services  described in this note is  90  Minutes. This time reflects time of care of this signee Dr Brand Males. This critical care time does not reflect procedure time, or teaching time or supervisory time of PA/NP/Med student/Med Resident etc but could involve care discussion time      SIGNATURE    Dr. Brand Males, M.D., F.C.C.P,  Pulmonary and Critical Care Medicine Staff Physician, [place] Director - Interstitial Lung Disease  Program  Pulmonary [place] at [place], [place], [address]  NPI Number:  NPI #[id number]  Pager: [phone], If no answer  -> Check AMION or Try [phone] Telephone (clinical office): [phone] Telephone (research): [phone]  2:19 AM 12/10/2021   12/10/2021 2:19 AM    LABS    PULMONARY Recent Labs  Lab 12/10/21 0030  PHART 7.12*  PCO2ART <18*  PO2ART 123*  HCO3 2.7*  O2SAT 100    CBC Recent Labs  Lab 12/08/21 1239  HGB 12.0  HCT 37.0  WBC 8.4  PLT 304    COAGULATION No results for input(s): "INR" in the last 168 hours.  CARDIAC  No results for input(s): "TROPONINI" in the last 168 hours. No results for input(s): "PROBNP" in the last 168 hours.  CHEMISTRY Recent Labs  Lab 12/08/21 1239  NA 137  K 3.0*  CL 109  CO2 7*  GLUCOSE 209*  BUN 22*  CREATININE 0.74  CALCIUM 9.7  MG 2.4   CrCl cannot be calculated (Unknown ideal weight.).   LIVER No results for input(s): "AST", "ALT", "ALKPHOS", "BILITOT", "PROT", "ALBUMIN", "INR" in the last 168 hours.   INFECTIOUS No results for input(s): "LATICACIDVEN", "PROCALCITON" in the last 168 hours.   ENDOCRINE CBG (last 3)  Recent Labs    12/09/21 2146  GLUCAP 316*         IMAGING x48h  - image(s) personally visualized  -   highlighted in bold DG CHEST PORT 1 VIEW  Result Date: 12/10/2021 CLINICAL DATA:  Altered mental status EXAM: PORTABLE CHEST 1 VIEW COMPARISON:  None Available.  FINDINGS: The heart size and mediastinal contours are within normal limits. Both lungs are clear. The visualized skeletal structures are unremarkable. IMPRESSION: No active disease. Electronically Signed   By: Fidela Salisbury M.D.   On: 12/10/2021 01:11   DG Chest 2 View  Result Date: 12/08/2021 CLINICAL DATA:  Cough EXAM: CHEST - 2 VIEW COMPARISON:  12/09/2020 FINDINGS: Possible streaky developing infiltrate at left base. No  consolidation. Normal cardiac size. No pneumothorax. Aortic atherosclerosis IMPRESSION: Possible streaky developing infiltrate at the left base. Electronically Signed   By: Donavan Foil M.D.   On: 12/08/2021 21:21   CT Knee Right Wo Contrast  Result Date: 12/08/2021 CLINICAL DATA:  [place] 6 days ago.  Evaluate left neck fractures. EXAM: CT OF THE RIGHT KNEE WITHOUT CONTRAST TECHNIQUE: Multidetector CT imaging of the right knee was performed according to the standard protocol. Multiplanar CT image reconstructions were also generated. RADIATION DOSE REDUCTION: This exam was performed according to the departmental dose-optimization program which includes automated exposure control, adjustment of the mA and/or kV according to patient size and/or use of iterative reconstruction technique. COMPARISON:  Radiographs, same date. FINDINGS: Complex comminuted intra-articular fracture involving the lateral femoral condyle. Early bony resorptive changes along the fracture site. Maximum step-off at the articular surfaces 8 mm. The tibia, fibula and patella are intact. There is significant age advanced osteoporosis. Large lipohemarthrosis is noted. Grossly by CT the cruciate and collateral ligaments are intact. The quadriceps and patellar tendons are intact. Popliteal artery graft noted. IMPRESSION: 1. Complex comminuted intra-articular fracture involving the lateral femoral condyle. Maximum step-off at the articular surfaces 8 mm. 2. Large lipohemarthrosis. 3. Age advanced osteoporosis. 4. Grossly by  CT the cruciate and collateral ligaments are intact. Electronically Signed   By: Marijo Sanes M.D.   On: 12/08/2021 14:58   DG Knee 2 Views Right  Result Date: 12/08/2021 CLINICAL DATA:  Fall 6 days ago.  Pain. EXAM: RIGHT KNEE - 1-2 VIEW COMPARISON:  None Available. FINDINGS: There is diffuse decreased bone mineralization. There is cortical step-off of the mid and lateral aspect of a weight-bearing lateral femoral condyle with the lateral portion displaced approximately 7 mm superior to the medial portion on frontal view. There is also similar superior cortical step-off of the posterosuperior aspect of the lateral femoral condyle on lateral view. Findings are suspicious for a displaced fracture. Moderate joint effusion with possible nondependent fat indicating a lipohemarthrosis from the acute fracture. Mild medial joint space narrowing and peripheral osteophytosis. Multiple contiguous vascular stents are seen within the superficial femoral artery through the popliteal artery. Moderate vascular calcifications. IMPRESSION: 1. Acute displaced fracture of the weight-bearing lateral femoral condyle with superior displacement of the lateral aspect of the lateral femoral condyle. 2. Moderate joint effusion with possible nondependent fat indicating a lipohemarthrosis related to the acute fracture. Electronically Signed   By: Yvonne Kendall M.D.   On: 12/08/2021 13:36

## 2021-12-10 NOTE — Progress Notes (Signed)
Pharmacy Antibiotic Note  Beth Carroll is a 51 y.o. female admitted on 12/09/2021 with  fall .  Pharmacy has been consulted for Vancomycin/Zosyn dosing for sepsis. WBC WNL. Scr 0.74. Hypotensive requiring transfer to ICU.  Plan: Vancomycin 1250 mg IV q24h >>>Estimated AUC: 471 Zosyn 3.375G IV q8h to be infused over 4 hours Trend WBC, temp, renal function  F/U infectious work-up Drug levels as indicated  Temp (24hrs), Avg:94.2 F (34.6 C), Min:91.5 F (33.1 C), Max:97.7 F (36.5 C)  Recent Labs  Lab 12/08/21 1239  WBC 8.4  CREATININE 0.74    CrCl cannot be calculated (Unknown ideal weight.).    No Known Allergies  Narda Bonds, PharmD, BCPS Clinical Pharmacist Phone: [phone]

## 2021-12-10 NOTE — Progress Notes (Signed)
Date and time results received: 12/10/21 12noon  (use smartphrase ".now" to insert current time)  Test: K Critical Value: 2.7  Name of Provider Notified: desai  Orders Received? Or Actions Taken?:  cont K

## 2021-12-10 NOTE — Consult Note (Addendum)
[place] Nurse Consult Note: Reason for Consult: Consult requested for buttocks and bilat heels.  Pt is critically ill in ICU with multiple systemic factors which can impair healing.  They were found down prior to admission for an extended period of time. Wound type: Bilat buttocks and sacrum are red, moist and macerated; appearance is consistent with moisture associated skin damage.  Affected area is approx 14X10X.1cm  ICD-10 CM Codes for Irritant Dermatitis L24A2 - Due to fecal, urinary or dual incontinence  Right buttock with dark red purple black Deep tissue pressure injury; evolving into Unstageable.  1X2cm Sacrum with Stage 2 pressure injury; 2X1X.5cm, pink and moist Left heel with dry slough/eschar, peeled off easily, revealing a Stage 2 pressure injury; .5X.5X.1cm Right heel with dry slough/eschar, peeled off easily, revealing a Stage 3 pressure injury; 4X3X.2cm, dark red and moist.  Pressure Injury POA: Yes Dressing procedure/placement/frequency: Pt is on a low air loss mattress to reduce pressure. Topical treatment orders provided for bedside nurses to perform to promote healing:  1. Apply Medihoney to right buttock Q day, then cover with foam dressing.  Change foam dressing Q 3 days or PRN soiling 2. Foam dressing to bilat heels, change Q 3 days or PRN soiling Please re-consult if further assistance is needed.  Thank-you,  Julien Girt MSN, [place], [place], [place], [place]

## 2021-12-10 NOTE — Progress Notes (Signed)
An USGPIV (ultrasound guided PIV) has been placed for short-term vasopressor infusion. A correctly placed ivWatch must be used when administering Vasopressors. Should this treatment be needed beyond 72 hours, central line access should be obtained.  It will be the responsibility of the bedside nurse to follow best practice to prevent extravasations.   ?

## 2021-12-10 NOTE — Progress Notes (Signed)
ANTICOAGULATION CONSULT NOTE - Follow Up Consult  Pharmacy Consult for Heparin (Apixaban on hold) Indication:  PAD  No Known Allergies  Patient Measurements: Height: '5\' 6"'$  (167.6 cm) Weight: 47.9 kg (105 lb 9.6 oz) IBW/kg (Calculated) : 59.3 Heparin dosing weight: 47.9 kg   Vital Signs: Temp: 98 F (36.7 C) (11/16 1149) Temp Source: Axillary (11/16 1149) BP: 107/67 (11/16 1200) Pulse Rate: 85 (11/16 1200)  Labs: Recent Labs    12/08/21 1239 12/10/21 0158 12/10/21 0350 12/10/21 0657 12/10/21 0817 12/10/21 1055 12/10/21 1158  HGB 12.0 10.7*  --   --   --   --   --   HCT 37.0 33.2*  --   --   --   --   --   PLT 304 270  --   --   --   --   --   APTT  --   --   --   --   --   --  44*  HEPARINUNFRC  --   --   --   --   --   --  0.45  CREATININE 0.74 1.32* 1.25* 1.45* 1.38* 1.27*  --   CKTOTAL  --  57  --   --   --   --   --   TROPONINIHS  --  43* 42*  --   --   --   --      Estimated Creatinine Clearance: 39.2 mL/min (A) (by C-G formula based on SCr of 1.27 mg/dL (H)).   Medical History: Past Medical History:  Diagnosis Date   Coronary artery disease    Diabetes mellitus without complication (HCC)    Diabetes mellitus, type 2 (HCC)    HFrEF (heart failure with reduced ejection fraction) ([place])    Hyperlipemia    Hypertension    Ischemic cardiomyopathy    Orthostatic hypotension    PAD (peripheral artery disease) ([place])    Tobacco use      Assessment: 51 y/o F s/p fall, tx from [facility] to [facility], had hypotension requiring transfer to the ICU, holding apixaban and starting heparin, Hgb 10.7, other labs above reviewed.   Patient has not received Eliquis since admission to [facility] at approximately 11/14 around midnight (unclear last dose, not collected in med rec). Patient's heparin level 0.45, aPTT subtherapeutic at 44. Anticipate that heparin level is falsely elevated due to recent DOAC intake. Will use PTT to dose heparin for now.   Goal of Therapy:  Heparin level  0.3-0.7 units/ml aPTT 66-102 seconds Monitor platelets by anticoagulation protocol: Yes   Plan:  Increase heparin to 700 units/hr (previously on 600u/hr, ~ 12 u/kg/hr to 14 u/kg/hr)  Recheck heparin level and aPTT in 8 hours.  Monitor for S/SX of bleeding.   Esmeralda Arthur, PharmD  Clinical Pharmacist Phone: [phone]

## 2021-12-10 NOTE — Progress Notes (Signed)
MD at bedside, decision to admit pt to ICU.  STAT chest xray performed.

## 2021-12-10 NOTE — Progress Notes (Signed)
Date and time results received: 12/10/21 1155 (use smartphrase ".now" to insert current time)  Test: K Critical Value: 2.6  Name of Provider Notified: desai  Orders Received? Or Actions Taken?: yesActions Taken: cont K

## 2021-12-11 ENCOUNTER — Inpatient Hospital Stay (HOSPITAL_COMMUNITY): Payer: Self-pay

## 2021-12-11 DIAGNOSIS — G928 Other toxic encephalopathy: Secondary | ICD-10-CM

## 2021-12-11 LAB — COMPREHENSIVE METABOLIC PANEL
ALT: 10 U/L (ref 0–44)
AST: 17 U/L (ref 15–41)
Albumin: 2.3 g/dL — ABNORMAL LOW (ref 3.5–5.0)
Alkaline Phosphatase: 56 U/L (ref 38–126)
Anion gap: 6 (ref 5–15)
BUN: 13 mg/dL (ref 6–20)
CO2: 22 mmol/L (ref 22–32)
Calcium: 9 mg/dL (ref 8.9–10.3)
Chloride: 114 mmol/L — ABNORMAL HIGH (ref 98–111)
Creatinine, Ser: 0.61 mg/dL (ref 0.44–1.00)
GFR, Estimated: >60 mL/min (ref >60)
Glucose, Bld: 142 mg/dL — ABNORMAL HIGH (ref 70–99)
Potassium: 3.3 mmol/L — ABNORMAL LOW (ref 3.5–5.1)
Sodium: 142 mmol/L (ref 135–145)
Total Bilirubin: 0.4 mg/dL (ref 0.3–1.2)
Total Protein: 4.9 g/dL — ABNORMAL LOW (ref 6.5–8.1)

## 2021-12-11 LAB — HEPARIN LEVEL (UNFRACTIONATED)
Heparin Unfractionated: 0.24 IU/mL — ABNORMAL LOW (ref 0.30–0.70)
Heparin Unfractionated: 0.29 IU/mL — ABNORMAL LOW (ref 0.30–0.70)

## 2021-12-11 LAB — URINE CULTURE: Culture: 30000 — AB

## 2021-12-11 LAB — CBC
HCT: 25.5 % — ABNORMAL LOW (ref 36.0–46.0)
Hemoglobin: 8.6 g/dL — ABNORMAL LOW (ref 12.0–15.0)
MCH: 27.5 pg (ref 26.0–34.0)
MCHC: 33.7 g/dL (ref 30.0–36.0)
MCV: 81.5 fL (ref 80.0–100.0)
Platelets: 199 10*3/uL (ref 150–400)
RBC: 3.13 MIL/uL — ABNORMAL LOW (ref 3.87–5.11)
RDW: 14.8 % (ref 11.5–15.5)
WBC: 7.7 10*3/uL (ref 4.0–10.5)
nRBC: 0 % (ref 0.0–0.2)

## 2021-12-11 LAB — CK TOTAL AND CKMB (NOT AT ARMC)
CK, MB: 2.2 ng/mL (ref 0.5–5.0)
Relative Index: INVALID (ref 0.0–2.5)
Total CK: 80 U/L (ref 38–234)

## 2021-12-11 LAB — BASIC METABOLIC PANEL
Anion gap: 4 — ABNORMAL LOW (ref 5–15)
Anion gap: 6 (ref 5–15)
Anion gap: 9 (ref 5–15)
BUN: 12 mg/dL (ref 6–20)
BUN: 16 mg/dL (ref 6–20)
BUN: 18 mg/dL (ref 6–20)
CO2: 20 mmol/L — ABNORMAL LOW (ref 22–32)
CO2: 21 mmol/L — ABNORMAL LOW (ref 22–32)
CO2: 22 mmol/L (ref 22–32)
Calcium: 8.8 mg/dL — ABNORMAL LOW (ref 8.9–10.3)
Calcium: 8.9 mg/dL (ref 8.9–10.3)
Calcium: 8.9 mg/dL (ref 8.9–10.3)
Chloride: 112 mmol/L — ABNORMAL HIGH (ref 98–111)
Chloride: 116 mmol/L — ABNORMAL HIGH (ref 98–111)
Chloride: 118 mmol/L — ABNORMAL HIGH (ref 98–111)
Creatinine, Ser: 0.49 mg/dL (ref 0.44–1.00)
Creatinine, Ser: 0.57 mg/dL (ref 0.44–1.00)
Creatinine, Ser: 0.74 mg/dL (ref 0.44–1.00)
GFR, Estimated: >60 mL/min (ref >60)
GFR, Estimated: >60 mL/min (ref >60)
GFR, Estimated: >60 mL/min (ref >60)
Glucose, Bld: 114 mg/dL — ABNORMAL HIGH (ref 70–99)
Glucose, Bld: 152 mg/dL — ABNORMAL HIGH (ref 70–99)
Glucose, Bld: 158 mg/dL — ABNORMAL HIGH (ref 70–99)
Potassium: 3.2 mmol/L — ABNORMAL LOW (ref 3.5–5.1)
Potassium: 3.3 mmol/L — ABNORMAL LOW (ref 3.5–5.1)
Potassium: 3.4 mmol/L — ABNORMAL LOW (ref 3.5–5.1)
Sodium: 142 mmol/L (ref 135–145)
Sodium: 142 mmol/L (ref 135–145)
Sodium: 144 mmol/L (ref 135–145)

## 2021-12-11 LAB — GLUCOSE, CAPILLARY
Glucose-Capillary: 107 mg/dL — ABNORMAL HIGH (ref 70–99)
Glucose-Capillary: 126 mg/dL — ABNORMAL HIGH (ref 70–99)
Glucose-Capillary: 132 mg/dL — ABNORMAL HIGH (ref 70–99)
Glucose-Capillary: 132 mg/dL — ABNORMAL HIGH (ref 70–99)
Glucose-Capillary: 132 mg/dL — ABNORMAL HIGH (ref 70–99)
Glucose-Capillary: 133 mg/dL — ABNORMAL HIGH (ref 70–99)
Glucose-Capillary: 136 mg/dL — ABNORMAL HIGH (ref 70–99)
Glucose-Capillary: 146 mg/dL — ABNORMAL HIGH (ref 70–99)
Glucose-Capillary: 146 mg/dL — ABNORMAL HIGH (ref 70–99)
Glucose-Capillary: 150 mg/dL — ABNORMAL HIGH (ref 70–99)
Glucose-Capillary: 151 mg/dL — ABNORMAL HIGH (ref 70–99)
Glucose-Capillary: 69 mg/dL — ABNORMAL LOW (ref 70–99)
Glucose-Capillary: 97 mg/dL (ref 70–99)

## 2021-12-11 LAB — APTT: aPTT: 73 seconds — ABNORMAL HIGH (ref 24–36)

## 2021-12-11 LAB — MAGNESIUM
Magnesium: 1.7 mg/dL (ref 1.7–2.4)
Magnesium: 2.2 mg/dL (ref 1.7–2.4)

## 2021-12-11 LAB — BETA-HYDROXYBUTYRIC ACID: Beta-Hydroxybutyric Acid: 0.09 mmol/L (ref 0.05–0.27)

## 2021-12-11 LAB — PHOSPHORUS: Phosphorus: <1 mg/dL — CL (ref 2.5–4.6)

## 2021-12-11 LAB — PROCALCITONIN: Procalcitonin: <0.1 ng/mL

## 2021-12-11 MED ORDER — INSULIN DETEMIR 100 UNIT/ML ~~LOC~~ SOLN
10.0000 [IU] | Freq: Two times a day (BID) | SUBCUTANEOUS | Status: DC
  Filled 2021-12-11: qty 0.1

## 2021-12-11 MED ORDER — ADULT MULTIVITAMIN W/MINERALS CH
1.0000 | ORAL_TABLET | Freq: Every day | ORAL | Status: DC
  Administered 2021-12-12 – 2021-12-13 (×2): 1
  Filled 2021-12-11 (×2): qty 1

## 2021-12-11 MED ORDER — POTASSIUM PHOSPHATES 15 MMOLE/5ML IV SOLN
45.0000 mmol | Freq: Once | INTRAVENOUS | Status: AC
  Administered 2021-12-11: 45 mmol via INTRAVENOUS
  Filled 2021-12-11: qty 15

## 2021-12-11 MED ORDER — PROSOURCE TF20 ENFIT COMPATIBL EN LIQD
60.0000 mL | Freq: Every day | ENTERAL | Status: DC
  Administered 2021-12-11 – 2021-12-13 (×3): 60 mL
  Filled 2021-12-11 (×3): qty 60

## 2021-12-11 MED ORDER — SODIUM CHLORIDE 0.9 % IV SOLN
2.0000 g | INTRAVENOUS | Status: AC
  Administered 2021-12-11 – 2021-12-16 (×6): 2 g via INTRAVENOUS
  Filled 2021-12-11 (×6): qty 20

## 2021-12-11 MED ORDER — OSMOLITE 1.2 CAL PO LIQD
1000.0000 mL | ORAL | Status: DC
  Administered 2021-12-11 – 2021-12-13 (×2): 1000 mL
  Filled 2021-12-11 (×4): qty 1000

## 2021-12-11 MED ORDER — POTASSIUM CHLORIDE 10 MEQ/100ML IV SOLN
10.0000 meq | INTRAVENOUS | Status: AC
  Administered 2021-12-11 (×4): 10 meq via INTRAVENOUS
  Filled 2021-12-11 (×4): qty 100

## 2021-12-11 MED ORDER — DEXTROSE 50 % IV SOLN
12.5000 g | INTRAVENOUS | Status: AC
  Administered 2021-12-11: 12.5 g via INTRAVENOUS

## 2021-12-11 MED ORDER — INSULIN DETEMIR 100 UNIT/ML ~~LOC~~ SOLN
10.0000 [IU] | Freq: Two times a day (BID) | SUBCUTANEOUS | Status: DC
  Administered 2021-12-11 – 2021-12-12 (×4): 10 [IU] via SUBCUTANEOUS
  Filled 2021-12-11 (×6): qty 0.1

## 2021-12-11 MED ORDER — MAGNESIUM SULFATE 2 GM/50ML IV SOLN
2.0000 g | Freq: Once | INTRAVENOUS | Status: AC
  Administered 2021-12-11: 2 g via INTRAVENOUS
  Filled 2021-12-11: qty 50

## 2021-12-11 MED ORDER — INSULIN ASPART 100 UNIT/ML IJ SOLN
2.0000 [IU] | INTRAMUSCULAR | Status: DC
  Administered 2021-12-11 (×2): 2 [IU] via SUBCUTANEOUS
  Administered 2021-12-12 (×2): 4 [IU] via SUBCUTANEOUS
  Administered 2021-12-12 (×2): 2 [IU] via SUBCUTANEOUS
  Administered 2021-12-12: 4 [IU] via SUBCUTANEOUS
  Administered 2021-12-12 (×2): 2 [IU] via SUBCUTANEOUS
  Administered 2021-12-13: 4 [IU] via SUBCUTANEOUS
  Administered 2021-12-13: 2 [IU] via SUBCUTANEOUS

## 2021-12-11 NOTE — Progress Notes (Signed)
Initial Nutrition Assessment  DOCUMENTATION CODES:   Severe malnutrition in context of chronic illness  INTERVENTION:   Initiate tube feeds via Cortrak once placement confirmed: - Start Osmolite 1.2 @ 20 ml/hr and advance by 10 ml q 8 hours to goal rate of 60 ml/hr (1440 ml/day) - PROSource TF20 60 ml daily  Tube feeding regimen at goal rate provides 1808 kcal, 100 grams of protein, and 1181 ml of H2O.   Monitor magnesium, potassium, and phosphorus BID for at least 3 days, MD to replete as needed, as pt is at risk for refeeding syndrome given severe malnutrition.  - MVI with minerals daily per tube  - Checking vitamin E, copper, zinc, vitamin B6, vitamin B12, vitamin D, vitamin A, and vitamin C labs; recommend supplementation if deficiency is identified  NUTRITION DIAGNOSIS:   Severe Malnutrition related to chronic illness (CHF, COPD) as evidenced by severe fat depletion, severe muscle depletion.  GOAL:   Patient will meet greater than or equal to 90% of their needs  MONITOR:   Diet advancement, Labs, Weight trends, TF tolerance, Skin  REASON FOR ASSESSMENT:   Consult Enteral/tube feeding initiation and management  ASSESSMENT:   51 year old female who presented to the [facility] ED on 11/14 after a fall. PMH of T2DM, CAD, PAD, CHF, COPD, HTN, HLD, chronic R heel ischemic ulceration. Pt admitted with R complex comminuted intra-articular fracture of the R lateral femoral condyle and large knee lipohemarthrosis as well as septic shock secondary to UTI and acute metabolic encephalopathy.  Noted pt also with euglycemic DKA secondary to [place], AKI. Pressors and insulin drip have been weaned to off. Anion gap closed. Pt is NPO at this time.  Spoke with pt's daughter at bedside. Pt's daughter shares that pt lives with her boyfriend and that pt's daughter visits on the weekend. Pt's daughter unable to provide much diet or weight history. Pt's daughter reports that pt had a "good  appetite before she got sick." Pt's daughter shares that pt got sick over the last 1-2 weeks. Pt did not eat anything at all for the 2 days PTA.  When asked what pt typically consumes, pt's daughter reports that pt eats "healthy" because she has diabetes. She states that pt drinks water and Diet Dr. Malachi Bonds and eats things like fruit, sugar-free jello, and sugar-free pudding. RD asked about protein foods like chicken, beef, fish, etc. Pt's daughter says that pt eats these foods as well. Unable to obtain more details about PO intake.  Pt's daughter confirms that pt has been losing weight. Notes report a weight loss of 100 lbs. Pt's daughter does not know how much weight pt has lost but shares that pt used to weigh over 300 lbs "years ago." Reviewed weight history in chart. Weight history is limited to the last 1 year. Noted pt's weight has fluctuated between 48-57 kg over the last year. Current weight is 56.1 kg and is above weight from 1 year ago (54.1 kg on 12/16/20) but pt with moderate pitting edema to RLE and non-pitting edema to LLE. Suspect true dry weight is lower but unsure of exact dry weight.  Pt with multiple wounds and meets criteria for severe malnutrition. RD checking multiple vitamin and mineral labs to assess for deficiencies.  Admit weight: 47.9 kg Current weight: 56.1 kg  Medications reviewed and include: IV folic acid 1 mg daily, SSI q 4 hours, levemir 10 units q 12 hours, IV protonix, IV thiamine 100 mg daily, IV abx, heparin drip  Vitamin/Mineral Profile: Vitamin B12: pending Vitamin B6: pending Vitamin A: pending Vitamin D: pending Vitamin E: pending Vitamin C: pending Copper: pending Zinc: pending CRP: pending  Labs reviewed: potassium 3.3 (pt received IV KCl 10 mEq x 4), phosphorus 2.3 on 11/16, magnesium 1.7 (pt received IV magnesium sulfate 2 grams x 1), hemoglobin 8.6, hemoglobin A1C 8.1 CBG's: 97-192 x 24 hours  UOP: 2250 ml x 24 hours I/O's: +8.9 L since  admit  NUTRITION - FOCUSED PHYSICAL EXAM:  Flowsheet Row Most Recent Value  Orbital Region Severe depletion  Upper Arm Region Moderate depletion  Thoracic and Lumbar Region Severe depletion  Buccal Region Severe depletion  Temple Region Severe depletion  Clavicle Bone Region Severe depletion  Clavicle and Acromion Bone Region Severe depletion  Scapular Bone Region Severe depletion  Dorsal Hand Moderate depletion  Patellar Region Severe depletion  Anterior Thigh Region Severe depletion  Posterior Calf Region Severe depletion  Edema (RD Assessment) Mild  [BLE]  Hair Reviewed  Eyes Reviewed  Mouth Reviewed  Skin Reviewed  Nails Reviewed       Diet Order:   Diet Order             Diet NPO time specified  Diet effective now                   EDUCATION NEEDS:   Not appropriate for education at this time  Skin:  Skin Assessment: Skin Integrity Issues: DTI: R buttock Stage II: L heel, sacrum Stage III: R heel Other: MASD bilateral buttocks  Last BM:  12/11/21 type 7  Height:   Ht Readings from Last 1 Encounters:  12/10/21 '5\' 6"'$  (1.676 m)    Weight:   Wt Readings from Last 1 Encounters:  12/11/21 56.1 kg    Ideal Body Weight:  59.1 kg  BMI:  Body mass index is 19.96 kg/m.  Estimated Nutritional Needs:   Kcal:  1700-1900  Protein:  85-100 grams  Fluid:  1.7-1.9 L    Gustavus Bryant, MS, RD, LDN Inpatient Clinical Dietitian Please see AMiON for contact information.

## 2021-12-11 NOTE — Progress Notes (Signed)
ANTICOAGULATION CONSULT NOTE - Follow Up Consult  Pharmacy Consult for Heparin (Apixaban on hold) Indication:  PAD  No Known Allergies  Patient Measurements: Height: '5\' 6"'$  (167.6 cm) Weight: 56.1 kg (123 lb 10.9 oz) IBW/kg (Calculated) : 59.3 Heparin dosing weight: 47.9 kg   Vital Signs: Temp: 98.3 F (36.8 C) (11/17 1100) Temp Source: Oral (11/17 1100) BP: 144/77 (11/17 1600) Pulse Rate: 78 (11/17 1600)  Labs: Recent Labs    12/10/21 0158 12/10/21 0350 12/10/21 0657 12/10/21 1158 12/10/21 1536 12/10/21 2055 12/10/21 2340 12/11/21 0239 12/11/21 0756 12/11/21 1021 12/11/21 1644  HGB 10.7*  --   --   --   --   --   --   --  8.6*  --   --   HCT 33.2*  --   --   --   --   --   --   --  25.5*  --   --   PLT 270  --   --   --   --   --   --   --  199  --   --   APTT  --   --   --  44*  --  50*  --   --  73*  --   --   HEPARINUNFRC  --   --    < > 0.45  --  0.38  --   --  0.24*  --  0.29*  CREATININE 1.32* 1.25*   < > 1.16*   < >  --    < > 0.57 0.61 0.49  --   CKTOTAL 57  --   --   --   --   --   --  80  --   --   --   CKMB  --   --   --   --   --   --   --  2.2  --   --   --   TROPONINIHS 43* 42*  --   --   --   --   --   --   --   --   --    < > = values in this interval not displayed.     Estimated Creatinine Clearance: 72.9 mL/min (by C-G formula based on SCr of 0.49 mg/dL).   Medical History: Past Medical History:  Diagnosis Date   Coronary artery disease    Diabetes mellitus without complication (HCC)    Diabetes mellitus, type 2 (HCC)    HFrEF (heart failure with reduced ejection fraction) ([place])    Hyperlipemia    Hypertension    Ischemic cardiomyopathy    Orthostatic hypotension    PAD (peripheral artery disease) ([place])    Tobacco use      Assessment: 51 y/o F s/p fall, tx from [facility] to [facility], had hypotension requiring transfer to the ICU, holding apixaban and starting heparin, HgB has down-trended from 10.7 to 8.6, anticipate is dilutional due to  volume of fluids.No s/sx of bleeding per RN.    Patient's heparin level subtherapeutic at 0.24 and aPTT therapeutic at 73 while on heparin 850u/hr. Has been > 48 hours since last dose of DOAC and patient's AKI has resolved. Although, heparin level and aPTT are not correlating anticipate that heparin level is more accurate. Unlikely that DOAC use is still impacting heparin level given above factors. Will use heparin level to adjustment and stop colleting aPTT.    Goal of Therapy:  Heparin  level: 0.3-0.7 Monitor platelets by anticoagulation protocol: Yes   Plan:  Increase heparin to 1050u/hr.  Recheck anti-Xa in AM  Monitor H & H daily.  Follow up transition back to home Eliquis.   Onnie Boer, PharmD, BCIDP, AAHIVP, CPP Infectious Disease Pharmacist 12/11/2021 5:54 PM

## 2021-12-11 NOTE — Progress Notes (Addendum)
Inpatient Diabetes Program Recommendations  AACE/ADA: New Consensus Statement on Inpatient Glycemic Control (2015)  Target Ranges:  Prepandial:   less than 140 mg/dL      Peak postprandial:   less than 180 mg/dL (1-2 hours)      Critically ill patients:  140 - 180 mg/dL   Lab Results  Component Value Date   GLUCAP 136 (H) 12/11/2021   HGBA1C 8.1 (H) 12/10/2021    Review of Glycemic Control  Latest Reference Range & Units 12/11/21 05:43 12/11/21 06:42 12/11/21 07:48 12/11/21 08:46 12/11/21 09:44  Glucose-Capillary 70 - 99 mg/dL 126 (H) 146 (H) 132 (H) 132 (H) 136 (H)   Diabetes history: DM  Outpatient Diabetes medications:  Lantus 25 units daily, Novolog 6-7 units tid with meals Ozempic 0.5 mg weekly Farxiga 10 mg daily Current orders for Inpatient glycemic control:  Levemir 10 units bid Novolog 2-4-6 q 4 hours  Inpatient Diabetes Program Recommendations:    Agree with current orders. A1C is improved.  Will follow.  Consider holding Wilder Glade and Ozempic at discharge until f/u with Dr. Gabriel Carina (endocrinology).   Thanks,  Adah Perl, RN, BC-ADM Inpatient Diabetes Coordinator Pager [phone]  (8a-5p)

## 2021-12-11 NOTE — Progress Notes (Signed)
eLink Physician-Brief Progress Note Patient Name: Beth Carroll DOB: May 13, 1970 MRN: [id number]   Date of Service  12/11/2021  HPI/Events of Note  Notified of phos <1.0.   K 3.3 this morning and was repleted with 85mq x 4 doses.  Crea 0.49.  eICU Interventions  Replete with Kphos 468ml as per protocol.      Intervention Category Intermediate Interventions: Electrolyte abnormality - evaluation and management  VaElsie Lincoln1/17/2023, 7:54 PM

## 2021-12-11 NOTE — Progress Notes (Signed)
ANTICOAGULATION CONSULT NOTE - Follow Up Consult  Pharmacy Consult for Heparin (Apixaban on hold) Indication:  PAD  No Known Allergies  Patient Measurements: Height: '5\' 6"'$  (167.6 cm) Weight: 56.1 kg (123 lb 10.9 oz) IBW/kg (Calculated) : 59.3 Heparin dosing weight: 47.9 kg   Vital Signs: Temp: 98.3 F (36.8 C) (11/17 0800) Temp Source: Oral (11/17 0800) BP: 126/64 (11/17 0600) Pulse Rate: 79 (11/17 0600)  Labs: Recent Labs    12/08/21 1239 12/10/21 0158 12/10/21 0350 12/10/21 0657 12/10/21 1158 12/10/21 1536 12/10/21 1902 12/10/21 2055 12/10/21 2340 12/11/21 0239 12/11/21 0756  HGB 12.0 10.7*  --   --   --   --   --   --   --   --  8.6*  HCT 37.0 33.2*  --   --   --   --   --   --   --   --  25.5*  PLT 304 270  --   --   --   --   --   --   --   --  199  APTT  --   --   --   --  44*  --   --  50*  --   --   --   HEPARINUNFRC  --   --   --   --  0.45  --   --  0.38  --   --  0.24*  CREATININE 0.74 1.32* 1.25*   < > 1.16*   < > 0.72  --  0.74 0.57  --   CKTOTAL  --  57  --   --   --   --   --   --   --  80  --   CKMB  --   --   --   --   --   --   --   --   --  2.2  --   TROPONINIHS  --  43* 42*  --   --   --   --   --   --   --   --    < > = values in this interval not displayed.     Estimated Creatinine Clearance: 72.9 mL/min (by C-G formula based on SCr of 0.57 mg/dL).   Medical History: Past Medical History:  Diagnosis Date   Coronary artery disease    Diabetes mellitus without complication (HCC)    Diabetes mellitus, type 2 (HCC)    HFrEF (heart failure with reduced ejection fraction) ([place])    Hyperlipemia    Hypertension    Ischemic cardiomyopathy    Orthostatic hypotension    PAD (peripheral artery disease) ([place])    Tobacco use      Assessment: 51 y/o F s/p fall, tx from [facility] to [facility], had hypotension requiring transfer to the ICU, holding apixaban and starting heparin, HgB has down-trended from 10.7 to 8.6, anticipate is dilutional due to volume  of fluids.No s/sx of bleeding per RN.    Patient's heparin level subtherapeutic at 0.24 and aPTT therapeutic at 73 while on heparin 850u/hr. Has been > 48 hours since last dose of DOAC and patient's AKI has resolved. Although, heparin level and aPTT are not correlating anticipate that heparin level is more accurate. Unlikely that DOAC use is still impacting heparin level given above factors. Will use heparin level to adjustment and stop colleting aPTT.    Goal of Therapy:  Heparin level 0.3-0.7 units/ml aPTT 66-102 seconds  Monitor platelets by anticoagulation protocol: Yes   Plan:  Increase heparin to 950u/hr.  Recheck anti-Xa in 8 hours, stop collecting aPTT.  Monitor H & H daily.  Follow up transition back to home Eliquis.   Esmeralda Arthur, PharmD  Clinical Pharmacist Phone: [phone]

## 2021-12-11 NOTE — Progress Notes (Signed)
NAME:  Beth Carroll, MRN:  [id number], DOB:  Jun 13, 1970, LOS: 2 ADMISSION DATE:  12/09/2021, CONSULTATION DATE:  12/11/2021 REFERRING MD:  TRH, CHIEF COMPLAINT:  septic shock, acidosis   History of Present Illness:  51 year old female who reportedly has lost over 100 pounds of weight according to the daughter's boyfriend with significant failure to thrive, severe cachexia smoker quit 1 year ago and COPD not otherwise specified but not on oxygen, known chronic systolic heart failure EF 45% [at least since 2022], poorly controlled diabetes [hemoglobin A1c 12.9 in July 2023], hypertension, hyperlipidemia.  Since summer 2023 she is got chronic right  heel ischemic ulceration and is status post thrombolysis and thrombectomy for lower lobe claudication in July 2023.  Discovered to have benign meningioma occipital lobe in July 2023.   Admitted to [place] ER 12/08/2021 after a fall on 12/02/2021 and right knee pain.  Patient then spent the next few days in a recliner and upon EMS arrival was found to be saturated in urine.  In the ER was discovered to have right femoral condylar fracture along with abnormal urine analysis consistent with UTI. Upon arrival found to be alert with normal vital signs.  But also found to have stage I-2 wound of the sacrum.  Patient waited at [facility] ER for a bed at [facility].  She was sitting pain control and treatment for UTI with ceftriaxone.  During this time family was concerned for patient suffering dehydration.   At the time of transfer to [facility] patient was found to be hypothermic And sleepy and weak.  Fluids was initiated and patient transferred to the orthopedic floor at [facility].  Subsequently patient found to be hypotensive and persistently hypothermic.  ABG showed severe metabolic acidosis and CCM was consulted.  At the time of CCM evaluation patient did receive 1.5 L of fluid in total    Pertinent  Medical History  Type 2 DM, HFrEF, PAD s/p  peripheral stenting, tobacco use disorder  Significant Hospital Events: Including procedures, antibiotic start and stop dates in addition to other pertinent events   11/15 admitted to [facility] as transfer from [facility] for fracture repair 11/15 overnight transferred from [facility] med surg to ICU for hypotension, acidosis, lethargy Urine culture 11/14 >> E. Coli, pan-sens Blood cultures 11/16 >>   Interim History / Subjective:  Pressors weaned to off Insulin drip weaned off I/O+ 7.9 L total Mild hypokalemia, anion gap closed, CO2 22  Objective   Blood pressure 126/64, pulse 79, temperature 98.3 F (36.8 C), temperature source Oral, resp. rate 16, height '5\' 6"'$  (1.676 m), weight 56.1 kg, SpO2 98 %.        Intake/Output Summary (Last 24 hours) at 12/11/2021 0954 Last data filed at 12/11/2021 0731 Gross per 24 hour  Intake 8102.96 ml  Output 2350 ml  Net 5752.96 ml   Filed Weights   12/10/21 0228 12/11/21 0500  Weight: 47.9 kg 56.1 kg    Examination: General: Chronically ill-appearing woman, no distress HENT: Oropharynx dry, pupils equal Lungs: Comfortable respiratory pattern, no crackles or wheezes Cardiovascular: Distant, regular, no murmur Abdomen: Nondistended, Positive bowel sounds Extremities: No edema Neuro: Quite difficult to rouse, she will open eyes and answer questions but then quickly back to sleep.  Globally weak but no deficit   Resolved Hospital Problem list     Assessment & Plan:   Beth Carroll is a 51 y.o. woman with PAD, tobacco use disorder, Type 2 DM on Farxiga who  presents with:  Anion gap metabolic acidosis Type 2 DM Euglycemic DKA secondary to Farxiga Mild lipase elevation- -Has responded to aggressive treatment for her DKA, volume resuscitation.  Now off insulin infusion -Transition to Levemir 10 units twice daily plus sliding scale regular insulin -Unclear whether she can be back on Farxiga  Hypokalemia AKI -Follow urine output and  BMP -Replace potassium 11/17  Septic shock secondary to UTI -Shock state improved -E coli is pan sensitive, plan change zosyn to CTX, vancomycin discontinued already -Pressors weaned to off  Acute metabolic encephalopathy -Treating underlying contributors as above -Minimize sedating medication -Remains quite lethargic.  Consider further evaluation if mental status fails to improve with supportive care as outlined  Chronic HFrEF EF 40% HTN PAD on systemic eliquis -Home antihypertensives on hold, restart as hemodynamics improve -Continue heparin infusion for her peripheral arterial disease  Tobacco use disorder COPD -No current exacerbation  Femoral fracture -We will need Ortho repair once she has stabilized from acute issues.  Should be able to do so when her mental status improves  Best Practice (right click and "Reselect all SmartList Selections" daily)   Diet/type: NPO DVT prophylaxis: systemic heparin for PAD GI prophylaxis: N/A Lines: N/A Foley:  N/A Code Status:  full code Last date of multidisciplinary goals of care discussion [pending]   The patient is critically ill due to acidosis, shock.  Critical care was necessary to treat or prevent imminent or life-threatening deterioration.  Critical care was time spent personally by me on the following activities: development of treatment plan with patient and/or surrogate as well as nursing, discussions with consultants, evaluation of patient's response to treatment, examination of patient, obtaining history from patient or surrogate, ordering and performing treatments and interventions, ordering and review of laboratory studies, ordering and review of radiographic studies, pulse oximetry, re-evaluation of patient's condition and participation in multidisciplinary rounds.   Critical Care Time devoted to patient care services described in this note is 31 minutes. This time reflects time of care of this [place] .  This critical care time does not reflect separately billable procedures or procedure time, teaching time or supervisory time of PA/NP/Med student/Med Resident etc but could involve care discussion time.       Baltazar Apo, MD, PhD 12/11/2021, 9:54 AM [facility] [phone] or if no answer before 7:00PM call [phone] For any issues after 7:00PM please call eLink [phone]

## 2021-12-11 NOTE — Progress Notes (Signed)
eLink Physician-Brief Progress Note Patient Name: Beth Carroll DOB: 01/06/1971 MRN: [id number]   Date of Service  12/11/2021  HPI/Events of Note  Hyperglycemia - EndoTool prompting to transition off of insulin IV infusion. Beta Hydroxybyteric Acid = 0.09 (normal).  eICU Interventions  Plan: Levermir 10 units [place] Q 12 hours. Q 4 hour standard Novolog SSI.  D/C Insulin IV infusion 2 hours post first Levemir dose.      Intervention Category Major Interventions: Hyperglycemia - active titration of insulin therapy  Lysle Dingwall 12/11/2021, 4:36 AM

## 2021-12-11 NOTE — Procedures (Signed)
Cortrak  Person Inserting Tube:  Ranell Patrick D, RD Tube Type:  Cortrak - 43 inches Tube Size:  10 Tube Location:  Right nare Secured by: Bridle Technique Used to Measure Tube Placement:  Marking at nare/corner of mouth Cortrak Secured At:  64 cm Procedure Comments:  Cortrak Tube Team Note:  Consult received to place a Cortrak feeding tube.   X-ray is required, abdominal x-ray has been ordered by the Cortrak team. Please confirm tube placement before using the Cortrak tube.   If the tube becomes dislodged please keep the tube and contact the Cortrak team at [email or web address] for replacement.  If after hours and replacement cannot be delayed, place a NG tube and confirm placement with an abdominal x-ray.    Ranell Patrick, RD, LDN Clinical Dietitian RD pager # available in [place]  After hours/weekend pager # available in [facility]

## 2021-12-11 NOTE — Progress Notes (Signed)
[facility] ADULT ICU REPLACEMENT PROTOCOL   The patient does apply for the [facility] Adult ICU Electrolyte Replacment Protocol based on the criteria listed below:   1.Exclusion criteria: TCTS, ECMO, Dialysis, and Myasthenia Gravis patients 2. Is GFR >/= 30 ml/min? Yes.    Patient's GFR today is >60 3. Is SCr </= 2? Yes.   Patient's SCr is 0.57 mg/dL 4. Did SCr increase >/= 0.5 in 24 hours? No. 5.Pt's weight >40kg  Yes.   6. Abnormal electrolyte(s): K+ 3.4, mag 1.7  7. Electrolytes replaced per protocol 8.  Call MD STAT for K+ </= 2.5, Phos </= 1, or Mag </= 1 Physician:  n/a  Beth Carroll 12/11/2021 4:22 AM

## 2021-12-12 DIAGNOSIS — E1110 Type 2 diabetes mellitus with ketoacidosis without coma: Secondary | ICD-10-CM

## 2021-12-12 DIAGNOSIS — E43 Unspecified severe protein-calorie malnutrition: Secondary | ICD-10-CM | POA: Diagnosis present

## 2021-12-12 LAB — PHOSPHORUS
Phosphorus: 2.7 mg/dL (ref 2.5–4.6)
Phosphorus: 2.4 mg/dL — ABNORMAL LOW (ref 2.5–4.6)

## 2021-12-12 LAB — HEPARIN LEVEL (UNFRACTIONATED)
Heparin Unfractionated: 0.23 IU/mL — ABNORMAL LOW (ref 0.30–0.70)
Heparin Unfractionated: 0.32 IU/mL (ref 0.30–0.70)
Heparin Unfractionated: 0.35 IU/mL (ref 0.30–0.70)

## 2021-12-12 LAB — MAGNESIUM
Magnesium: 2.1 mg/dL (ref 1.7–2.4)
Magnesium: 2.1 mg/dL (ref 1.7–2.4)

## 2021-12-12 LAB — CBC
HCT: 26.8 % — ABNORMAL LOW (ref 36.0–46.0)
Hemoglobin: 8.9 g/dL — ABNORMAL LOW (ref 12.0–15.0)
MCH: 27.6 pg (ref 26.0–34.0)
MCHC: 33.2 g/dL (ref 30.0–36.0)
MCV: 83.2 fL (ref 80.0–100.0)
Platelets: 178 10*3/uL (ref 150–400)
RBC: 3.22 MIL/uL — ABNORMAL LOW (ref 3.87–5.11)
RDW: 15.5 % (ref 11.5–15.5)
WBC: 3.8 10*3/uL — ABNORMAL LOW (ref 4.0–10.5)
nRBC: 0 % (ref 0.0–0.2)

## 2021-12-12 LAB — BASIC METABOLIC PANEL
Anion gap: 9 (ref 5–15)
BUN: 13 mg/dL (ref 6–20)
CO2: 23 mmol/L (ref 22–32)
Calcium: 8.7 mg/dL — ABNORMAL LOW (ref 8.9–10.3)
Chloride: 115 mmol/L — ABNORMAL HIGH (ref 98–111)
Creatinine, Ser: 0.35 mg/dL — ABNORMAL LOW (ref 0.44–1.00)
GFR, Estimated: >60 mL/min (ref >60)
Glucose, Bld: 157 mg/dL — ABNORMAL HIGH (ref 70–99)
Potassium: 3.3 mmol/L — ABNORMAL LOW (ref 3.5–5.1)
Sodium: 147 mmol/L — ABNORMAL HIGH (ref 135–145)

## 2021-12-12 LAB — PROCALCITONIN: Procalcitonin: <0.1 ng/mL

## 2021-12-12 LAB — GLUCOSE, CAPILLARY
Glucose-Capillary: 163 mg/dL — ABNORMAL HIGH (ref 70–99)
Glucose-Capillary: 145 mg/dL — ABNORMAL HIGH (ref 70–99)
Glucose-Capillary: 179 mg/dL — ABNORMAL HIGH (ref 70–99)
Glucose-Capillary: 135 mg/dL — ABNORMAL HIGH (ref 70–99)
Glucose-Capillary: 130 mg/dL — ABNORMAL HIGH (ref 70–99)
Glucose-Capillary: 165 mg/dL — ABNORMAL HIGH (ref 70–99)

## 2021-12-12 LAB — VITAMIN D 25 HYDROXY (VIT D DEFICIENCY, FRACTURES): Vit D, 25-Hydroxy: 83.92 ng/mL (ref 30–100)

## 2021-12-12 LAB — C-REACTIVE PROTEIN: CRP: 1.6 mg/dL — ABNORMAL HIGH (ref <1.0)

## 2021-12-12 LAB — VITAMIN B12: Vitamin B-12: 1183 pg/mL — ABNORMAL HIGH (ref 180–914)

## 2021-12-12 MED ORDER — POTASSIUM CHLORIDE 20 MEQ PO PACK
20.0000 meq | PACK | Freq: Two times a day (BID) | ORAL | Status: DC
  Administered 2021-12-13: 20 meq via ORAL
  Filled 2021-12-12: qty 1

## 2021-12-12 MED ORDER — THIAMINE MONONITRATE 100 MG PO TABS
100.0000 mg | ORAL_TABLET | Freq: Every day | ORAL | Status: DC
  Administered 2021-12-13: 100 mg
  Filled 2021-12-12: qty 1

## 2021-12-12 MED ORDER — POTASSIUM PHOSPHATES 15 MMOLE/5ML IV SOLN
15.0000 mmol | Freq: Once | INTRAVENOUS | Status: AC
  Administered 2021-12-12: 15 mmol via INTRAVENOUS
  Filled 2021-12-12: qty 5

## 2021-12-12 MED ORDER — POTASSIUM CHLORIDE 20 MEQ PO PACK
40.0000 meq | PACK | ORAL | Status: AC
  Administered 2021-12-12 (×2): 40 meq
  Filled 2021-12-12 (×2): qty 2

## 2021-12-12 MED ORDER — FUROSEMIDE 10 MG/ML IJ SOLN
40.0000 mg | Freq: Every day | INTRAMUSCULAR | Status: AC
  Administered 2021-12-12 – 2021-12-14 (×3): 40 mg via INTRAVENOUS
  Filled 2021-12-12 (×3): qty 4

## 2021-12-12 MED ORDER — FOLIC ACID 1 MG PO TABS
1.0000 mg | ORAL_TABLET | Freq: Every day | ORAL | Status: DC
  Administered 2021-12-13: 1 mg
  Filled 2021-12-12: qty 1

## 2021-12-12 NOTE — Progress Notes (Signed)
NAME:  Beth Carroll, MRN:  [id number], DOB:  04-15-1970, LOS: 3 ADMISSION DATE:  12/09/2021, CONSULTATION DATE:  12/12/2021 REFERRING MD:  TRH, CHIEF COMPLAINT:  septic shock, acidosis   History of Present Illness:  51 year old female who reportedly has lost over 100 pounds of weight according to the daughter's boyfriend with significant failure to thrive, severe cachexia smoker quit 1 year ago and COPD not otherwise specified but not on oxygen, known chronic systolic heart failure EF 45% [at least since 2022], poorly controlled diabetes [hemoglobin A1c 12.9 in July 2023], hypertension, hyperlipidemia.  Since summer 2023 she is got chronic right  heel ischemic ulceration and is status post thrombolysis and thrombectomy for lower lobe claudication in July 2023.  Discovered to have benign meningioma occipital lobe in July 2023.   Admitted to [place] ER 12/08/2021 after a fall on 12/02/2021 and right knee pain.  Patient then spent the next few days in a recliner and upon EMS arrival was found to be saturated in urine.  In the ER was discovered to have right femoral condylar fracture along with abnormal urine analysis consistent with UTI. Upon arrival found to be alert with normal vital signs.  But also found to have stage I-2 wound of the sacrum.  Patient waited at [facility] ER for a bed at [facility].  She was sitting pain control and treatment for UTI with ceftriaxone.  During this time family was concerned for patient suffering dehydration.   At the time of transfer to [facility] patient was found to be hypothermic And sleepy and weak.  Fluids was initiated and patient transferred to the orthopedic floor at [facility].  Subsequently patient found to be hypotensive and persistently hypothermic.  ABG showed severe metabolic acidosis and CCM was consulted.  At the time of CCM evaluation patient did receive 1.5 L of fluid in total    Pertinent  Medical History  Type 2 DM, HFrEF, PAD s/p  peripheral stenting, tobacco use disorder  Significant Hospital Events: Including procedures, antibiotic start and stop dates in addition to other pertinent events   11/15 admitted to [facility] as [facility] for fracture repair 11/15 overnight transferred from [facility] med surg to ICU for hypotension, acidosis, lethargy Urine culture 11/14 >> E. Coli, pan-sens Blood cultures 11/16 >> ngtd   Interim History / Subjective:   Patient doing much better today.  Off pressors tolerating tube feeds electrolyte abnormalities.  Objective   Blood pressure 105/64, pulse 73, temperature 98.7 F (37.1 C), temperature source Oral, resp. rate 13, height '5\' 6"'$  (1.676 m), weight 56.1 kg, SpO2 95 %.        Intake/Output Summary (Last 24 hours) at 12/12/2021 1128 Last data filed at 12/12/2021 0800 Gross per 24 hour  Intake 1664.98 ml  Output 2250 ml  Net -585.02 ml   Filed Weights   12/10/21 0228 12/11/21 0500  Weight: 47.9 kg 56.1 kg    Examination: General: Elderly female chronically ill-appearing no distress resting in bed HENT: Core track in place tracking appropriately NCAT Lungs: Clear to auscultation bilaterally no crackles no wheeze Cardiovascular: Regular rate rhythm S1-S2 Abdomen: Soft, nontender nondistended Extremities: Bilateral lower extremity edema, right leg in Ace bandage immobilized Neuro: Alert oriented following commands   Resolved Hospital Problem list     Assessment & Plan:   Beth Carroll is a 51 y.o. woman with PAD, tobacco use disorder, Type 2 DM on Farxiga who presents with:  Anion gap metabolic acidosis Type  2 DM Euglycemic DKA secondary to Farxiga Mild lipase elevation- Plan: Currently on Levemir and sliding scale Transitioned off insulin infusion  Hypokalemia AKI Plan: Continue to follow urine output and replete potassium Patient does have a positive cumulative fluid balance we will give a few doses of Lasix for D resuscitation efforts.  Septic  shock secondary to UTI Plan: Complete course of ceftriaxone x7 days  Acute metabolic encephalopathy Plan: Delirium precautions Mental status is much improved.  Chronic HFrEF EF 40% HTN PAD on systemic eliquis Plan: Continue heparin infusion Plans for surgery regarding femoral fracture during hospitalization. Can hold heparin prior to procedure. We will start Lasix today to help decrease cumulative fluid balance  Tobacco use disorder COPD No exacerbation As needed DuoNebs  Femoral fracture Plan: Appreciate Ortho input. Patient slowly improving suspect can have surgery early next week.  Patient is stable for transfer from the intensive care unit to progressive care.  Best Practice (right click and "Reselect all SmartList Selections" daily)   Diet/type: NPO DVT prophylaxis: systemic heparin for PAD GI prophylaxis: N/A Lines: N/A Foley:  N/A Code Status:  full code Last date of multidisciplinary goals of care discussion [pending]   Garner Nash, DO [facility] 12/12/2021 11:28 AM

## 2021-12-12 NOTE — Progress Notes (Signed)
ANTICOAGULATION CONSULT NOTE - Follow Up Consult  Pharmacy Consult for Heparin (Apixaban on hold) Indication:  PAD  No Known Allergies  Patient Measurements: Height: '5\' 6"'$  (167.6 cm) Weight: 56.1 kg (123 lb 10.9 oz) IBW/kg (Calculated) : 59.3 Heparin dosing weight: 48 kg   Vital Signs: Temp: 98.7 F (37.1 C) (11/18 0800) Temp Source: Oral (11/18 0800) BP: 135/76 (11/18 0800) Pulse Rate: 77 (11/18 0800)  Labs: Recent Labs    12/10/21 0158 12/10/21 0350 12/10/21 0657 12/10/21 1158 12/10/21 1536 12/10/21 2055 12/10/21 2340 12/11/21 0239 12/11/21 0756 12/11/21 1021 12/11/21 1644 12/12/21 0700  HGB 10.7*  --   --   --   --   --   --   --  8.6*  --   --  8.9*  HCT 33.2*  --   --   --   --   --   --   --  25.5*  --   --  26.8*  PLT 270  --   --   --   --   --   --   --  199  --   --  178  APTT  --   --   --  44*  --  50*  --   --  73*  --   --   --   HEPARINUNFRC  --   --    < > 0.45  --  0.38  --   --  0.24*  --  0.29* 0.23*  CREATININE 1.32* 1.25*   < > 1.16*   < >  --    < > 0.57 0.61 0.49  --   --   CKTOTAL 57  --   --   --   --   --   --  80  --   --   --   --   CKMB  --   --   --   --   --   --   --  2.2  --   --   --   --   TROPONINIHS 43* 42*  --   --   --   --   --   --   --   --   --   --    < > = values in this interval not displayed.     Estimated Creatinine Clearance: 72.9 mL/min (by C-G formula based on SCr of 0.49 mg/dL).   Medical History: Past Medical History:  Diagnosis Date   Coronary artery disease    Diabetes mellitus without complication (HCC)    Diabetes mellitus, type 2 (HCC)    HFrEF (heart failure with reduced ejection fraction) ([place])    Hyperlipemia    Hypertension    Ischemic cardiomyopathy    Orthostatic hypotension    PAD (peripheral artery disease) (HCC)    Tobacco use      Assessment: 51 y/o F s/p fall, tx from [facility] to [facility], had hypotension requiring transfer to the ICU, holding apixaban and starting heparin.  Heparin level  subtherapeutic at 0.23 units/mL while on heparin 1050 units/hr. CBC stable with no signs of bleeding.  Goal of Therapy:  Heparin level: 0.3-0.7 Monitor platelets by anticoagulation protocol: Yes   Plan:  Increase heparin to 1200 units/hr.  Check 6hr heparin level at 1500 Monitor H & H daily.  Follow up transition back to home Eliquis.   Erskine Speed, PharmD Clinical Pharmacist 12/12/2021 8:42 AM

## 2021-12-12 NOTE — Progress Notes (Signed)
ANTICOAGULATION CONSULT NOTE - Follow Up Consult  Pharmacy Consult for Heparin (Apixaban on hold) Indication:  PAD  No Known Allergies  Patient Measurements: Height: '5\' 6"'$  (167.6 cm) Weight: 56.1 kg (123 lb 10.9 oz) IBW/kg (Calculated) : 59.3 Heparin dosing weight: 48 kg   Vital Signs: Temp: 98.3 F (36.8 C) (11/18 1516) Temp Source: Oral (11/18 1516) BP: 147/82 (11/18 1200) Pulse Rate: 80 (11/18 1200)  Labs: Recent Labs    12/10/21 0158 12/10/21 0350 12/10/21 0657 12/10/21 1158 12/10/21 1536 12/10/21 2055 12/10/21 2340 12/11/21 0239 12/11/21 0756 12/11/21 1021 12/11/21 1644 12/12/21 0700 12/12/21 1449  HGB 10.7*  --   --   --   --   --   --   --  8.6*  --   --  8.9*  --   HCT 33.2*  --   --   --   --   --   --   --  25.5*  --   --  26.8*  --   PLT 270  --   --   --   --   --   --   --  199  --   --  178  --   APTT  --   --   --  44*  --  50*  --   --  73*  --   --   --   --   HEPARINUNFRC  --   --    < > 0.45  --  0.38  --   --  0.24*  --  0.29* 0.23* 0.32  CREATININE 1.32* 1.25*   < > 1.16*   < >  --    < > 0.57 0.61 0.49  --  0.35*  --   CKTOTAL 57  --   --   --   --   --   --  80  --   --   --   --   --   CKMB  --   --   --   --   --   --   --  2.2  --   --   --   --   --   TROPONINIHS 43* 42*  --   --   --   --   --   --   --   --   --   --   --    < > = values in this interval not displayed.     Estimated Creatinine Clearance: 72.9 mL/min (A) (by C-G formula based on SCr of 0.35 mg/dL (L)).   Medical History: Past Medical History:  Diagnosis Date   Coronary artery disease    Diabetes mellitus without complication (HCC)    Diabetes mellitus, type 2 (HCC)    HFrEF (heart failure with reduced ejection fraction) ([place])    Hyperlipemia    Hypertension    Ischemic cardiomyopathy    Orthostatic hypotension    PAD (peripheral artery disease) (HCC)    Tobacco use      Assessment: 51 y/o F s/p fall, tx from [facility] to [facility], had hypotension requiring transfer  to the ICU, holding apixaban and starting heparin. Plan for surgery for femoral fracture early next week. Will need to hold heparin prior to surgery.  Heparin level therapeutic at 0.32 units/mL while on heparin 1200 units/hr. CBC stable with no signs of bleeding.  Goal of Therapy:  Heparin level: 0.3-0.7 Monitor platelets by anticoagulation protocol: Yes  Plan:  Continue heparin to 1200 units/hr.  Check 6hr heparin level at 2100 Monitor H & H daily.  Follow up plan for surgery and eventual transition back to home Eliquis.   Varney Daily, PharmD PGY2 Pharmacy Resident  Please check AMION for all [facility] pharmacy phone numbers After 10:00 PM call main pharmacy [phone]

## 2021-12-12 NOTE — Progress Notes (Signed)
ANTICOAGULATION CONSULT NOTE - Follow Up Consult  Pharmacy Consult for Heparin (Apixaban on hold) Indication:  PAD  No Known Allergies  Patient Measurements: Height: '5\' 6"'$  (167.6 cm) Weight: 56.1 kg (123 lb 10.9 oz) IBW/kg (Calculated) : 59.3 Heparin dosing weight: 48 kg   Vital Signs: Temp: 98.5 F (36.9 C) (11/18 2322) Temp Source: Oral (11/18 2322) BP: 111/59 (11/18 2300) Pulse Rate: 75 (11/18 2300)  Labs: Recent Labs    12/10/21 0158 12/10/21 0350 12/10/21 0657 12/10/21 1158 12/10/21 1536 12/10/21 2055 12/10/21 2340 12/11/21 0239 12/11/21 0756 12/11/21 1021 12/11/21 1644 12/12/21 0700 12/12/21 1449 12/12/21 2038  HGB 10.7*  --   --   --   --   --   --   --  8.6*  --   --  8.9*  --   --   HCT 33.2*  --   --   --   --   --   --   --  25.5*  --   --  26.8*  --   --   PLT 270  --   --   --   --   --   --   --  199  --   --  178  --   --   APTT  --   --   --  44*  --  50*  --   --  73*  --   --   --   --   --   HEPARINUNFRC  --   --    < > 0.45  --  0.38  --   --  0.24*  --    < > 0.23* 0.32 0.35  CREATININE 1.32* 1.25*   < > 1.16*   < >  --    < > 0.57 0.61 0.49  --  0.35*  --   --   CKTOTAL 57  --   --   --   --   --   --  80  --   --   --   --   --   --   CKMB  --   --   --   --   --   --   --  2.2  --   --   --   --   --   --   TROPONINIHS 43* 42*  --   --   --   --   --   --   --   --   --   --   --   --    < > = values in this interval not displayed.     Estimated Creatinine Clearance: 72.9 mL/min (A) (by C-G formula based on SCr of 0.35 mg/dL (L)).   Medical History: Past Medical History:  Diagnosis Date   Coronary artery disease    Diabetes mellitus without complication (HCC)    Diabetes mellitus, type 2 (HCC)    HFrEF (heart failure with reduced ejection fraction) ([place])    Hyperlipemia    Hypertension    Ischemic cardiomyopathy    Orthostatic hypotension    PAD (peripheral artery disease) (HCC)    Tobacco use      Assessment: 51 y/o F s/p  fall, tx from [facility] to [facility], had hypotension requiring transfer to the ICU, holding apixaban and starting heparin.  11/18 PM update:  Heparin level therapeutic x 2  Goal of Therapy:  Heparin level 0.3-0.7 units/mL Monitor  platelets by anticoagulation protocol: Yes   Plan:  Cont heparin 1200 units/hr Daily CBC and heparin level Follow up transition back to home Eliquis.   Narda Bonds, PharmD, BCPS Clinical Pharmacist Phone: [phone]

## 2021-12-13 DIAGNOSIS — S72413A Displaced unspecified condyle fracture of lower end of unspecified femur, initial encounter for closed fracture: Secondary | ICD-10-CM

## 2021-12-13 DIAGNOSIS — N390 Urinary tract infection, site not specified: Secondary | ICD-10-CM

## 2021-12-13 DIAGNOSIS — E1110 Type 2 diabetes mellitus with ketoacidosis without coma: Principal | ICD-10-CM | POA: Diagnosis present

## 2021-12-13 DIAGNOSIS — R600 Localized edema: Secondary | ICD-10-CM

## 2021-12-13 DIAGNOSIS — E43 Unspecified severe protein-calorie malnutrition: Secondary | ICD-10-CM

## 2021-12-13 DIAGNOSIS — A419 Sepsis, unspecified organism: Secondary | ICD-10-CM | POA: Diagnosis present

## 2021-12-13 DIAGNOSIS — G9341 Metabolic encephalopathy: Secondary | ICD-10-CM | POA: Diagnosis present

## 2021-12-13 LAB — BASIC METABOLIC PANEL
Anion gap: 8 (ref 5–15)
BUN: 16 mg/dL (ref 6–20)
CO2: 29 mmol/L (ref 22–32)
Calcium: 8.7 mg/dL — ABNORMAL LOW (ref 8.9–10.3)
Chloride: 107 mmol/L (ref 98–111)
Creatinine, Ser: 0.36 mg/dL — ABNORMAL LOW (ref 0.44–1.00)
GFR, Estimated: >60 mL/min (ref >60)
Glucose, Bld: 184 mg/dL — ABNORMAL HIGH (ref 70–99)
Potassium: 3.6 mmol/L (ref 3.5–5.1)
Sodium: 144 mmol/L (ref 135–145)

## 2021-12-13 LAB — CBC
HCT: 28.3 % — ABNORMAL LOW (ref 36.0–46.0)
Hemoglobin: 9.4 g/dL — ABNORMAL LOW (ref 12.0–15.0)
MCH: 28.3 pg (ref 26.0–34.0)
MCHC: 33.2 g/dL (ref 30.0–36.0)
MCV: 85.2 fL (ref 80.0–100.0)
Platelets: 168 10*3/uL (ref 150–400)
RBC: 3.32 MIL/uL — ABNORMAL LOW (ref 3.87–5.11)
RDW: 15.8 % — ABNORMAL HIGH (ref 11.5–15.5)
WBC: 3.7 10*3/uL — ABNORMAL LOW (ref 4.0–10.5)
nRBC: 0 % (ref 0.0–0.2)

## 2021-12-13 LAB — PHOSPHORUS
Phosphorus: 3.4 mg/dL (ref 2.5–4.6)
Phosphorus: 3.3 mg/dL (ref 2.5–4.6)

## 2021-12-13 LAB — GLUCOSE, CAPILLARY
Glucose-Capillary: 184 mg/dL — ABNORMAL HIGH (ref 70–99)
Glucose-Capillary: 168 mg/dL — ABNORMAL HIGH (ref 70–99)
Glucose-Capillary: 259 mg/dL — ABNORMAL HIGH (ref 70–99)
Glucose-Capillary: 263 mg/dL — ABNORMAL HIGH (ref 70–99)
Glucose-Capillary: 177 mg/dL — ABNORMAL HIGH (ref 70–99)

## 2021-12-13 LAB — MAGNESIUM
Magnesium: 2 mg/dL (ref 1.7–2.4)
Magnesium: 1.9 mg/dL (ref 1.7–2.4)

## 2021-12-13 LAB — HEPARIN LEVEL (UNFRACTIONATED)
Heparin Unfractionated: 0.25 IU/mL — ABNORMAL LOW (ref 0.30–0.70)
Heparin Unfractionated: 0.37 IU/mL (ref 0.30–0.70)

## 2021-12-13 MED ORDER — OXYCODONE-ACETAMINOPHEN 5-325 MG PO TABS
1.0000 | ORAL_TABLET | ORAL | Status: DC | PRN
  Administered 2021-12-13 – 2021-12-16 (×7): 1 via ORAL
  Filled 2021-12-13 (×8): qty 1

## 2021-12-13 MED ORDER — POTASSIUM CHLORIDE CRYS ER 20 MEQ PO TBCR
20.0000 meq | EXTENDED_RELEASE_TABLET | Freq: Two times a day (BID) | ORAL | Status: AC
  Administered 2021-12-13: 20 meq via ORAL
  Filled 2021-12-13: qty 1

## 2021-12-13 MED ORDER — FOLIC ACID 1 MG PO TABS
1.0000 mg | ORAL_TABLET | Freq: Every day | ORAL | Status: DC
  Administered 2021-12-15 – 2021-12-18 (×4): 1 mg via ORAL
  Filled 2021-12-13 (×4): qty 1

## 2021-12-13 MED ORDER — HYDRALAZINE HCL 20 MG/ML IJ SOLN: 10.0000 mg | Freq: Four times a day (QID) | INTRAMUSCULAR | Status: DC | PRN

## 2021-12-13 MED ORDER — INSULIN DETEMIR 100 UNIT/ML ~~LOC~~ SOLN
10.0000 [IU] | Freq: Two times a day (BID) | SUBCUTANEOUS | Status: DC
  Administered 2021-12-13: 10 [IU] via SUBCUTANEOUS
  Filled 2021-12-13 (×2): qty 0.1

## 2021-12-13 MED ORDER — CARVEDILOL 3.125 MG PO TABS: 3.1250 mg | ORAL_TABLET | Freq: Two times a day (BID) | ORAL | Status: DC

## 2021-12-13 MED ORDER — INSULIN ASPART 100 UNIT/ML IJ SOLN
3.0000 [IU] | Freq: Three times a day (TID) | INTRAMUSCULAR | Status: DC
  Administered 2021-12-13 (×2): 3 [IU] via SUBCUTANEOUS

## 2021-12-13 MED ORDER — INSULIN DETEMIR 100 UNIT/ML ~~LOC~~ SOLN
12.0000 [IU] | Freq: Two times a day (BID) | SUBCUTANEOUS | Status: DC
  Administered 2021-12-13: 12 [IU] via SUBCUTANEOUS
  Filled 2021-12-13 (×3): qty 0.12

## 2021-12-13 MED ORDER — CARVEDILOL 3.125 MG PO TABS
3.1250 mg | ORAL_TABLET | Freq: Two times a day (BID) | ORAL | Status: DC
  Administered 2021-12-13: 3.125 mg via ORAL
  Filled 2021-12-13: qty 1

## 2021-12-13 MED ORDER — ADULT MULTIVITAMIN W/MINERALS CH
1.0000 | ORAL_TABLET | Freq: Every day | ORAL | Status: DC
  Administered 2021-12-15 – 2021-12-18 (×4): 1 via ORAL
  Filled 2021-12-13 (×4): qty 1

## 2021-12-13 MED ORDER — INSULIN ASPART 100 UNIT/ML IJ SOLN
0.0000 [IU] | Freq: Three times a day (TID) | INTRAMUSCULAR | Status: DC
  Administered 2021-12-13 (×2): 5 [IU] via SUBCUTANEOUS
  Administered 2021-12-14: 1 [IU] via SUBCUTANEOUS

## 2021-12-13 MED ORDER — POTASSIUM PHOSPHATES 15 MMOLE/5ML IV SOLN
15.0000 mmol | Freq: Once | INTRAVENOUS | Status: AC
  Administered 2021-12-13: 15 mmol via INTRAVENOUS
  Filled 2021-12-13: qty 5

## 2021-12-13 MED ORDER — INSULIN ASPART 100 UNIT/ML IJ SOLN: 0.0000 [IU] | Freq: Three times a day (TID) | INTRAMUSCULAR | Status: DC

## 2021-12-13 MED ORDER — PANTOPRAZOLE SODIUM 40 MG PO TBEC
40.0000 mg | DELAYED_RELEASE_TABLET | Freq: Every day | ORAL | Status: DC
  Administered 2021-12-15 – 2021-12-18 (×4): 40 mg via ORAL
  Filled 2021-12-13 (×5): qty 1

## 2021-12-13 MED ORDER — THIAMINE MONONITRATE 100 MG PO TABS
100.0000 mg | ORAL_TABLET | Freq: Every day | ORAL | Status: DC
  Administered 2021-12-15 – 2021-12-18 (×4): 100 mg via ORAL
  Filled 2021-12-13 (×4): qty 1

## 2021-12-13 MED ORDER — POTASSIUM PHOSPHATES 15 MMOLE/5ML IV SOLN: 30.0000 mmol | Freq: Once | INTRAVENOUS | Status: DC

## 2021-12-13 MED ORDER — LABETALOL HCL 5 MG/ML IV SOLN: 20.0000 mg | Freq: Once | INTRAVENOUS | Status: DC

## 2021-12-13 MED ORDER — POTASSIUM CHLORIDE CRYS ER 20 MEQ PO TBCR: 20.0000 meq | EXTENDED_RELEASE_TABLET | Freq: Two times a day (BID) | ORAL | Status: DC

## 2021-12-13 MED ORDER — INSULIN ASPART 100 UNIT/ML IJ SOLN
0.0000 [IU] | Freq: Every day | INTRAMUSCULAR | Status: DC
  Administered 2021-12-14: 5 [IU] via SUBCUTANEOUS
  Administered 2021-12-17: 3 [IU] via SUBCUTANEOUS

## 2021-12-13 NOTE — Progress Notes (Signed)
Removed cortrak per MD order

## 2021-12-13 NOTE — Progress Notes (Signed)
ANTICOAGULATION CONSULT NOTE - Follow Up Consult  Pharmacy Consult for Heparin (Apixaban on hold) Indication:  PAD  No Known Allergies  Patient Measurements: Height: '5\' 6"'$  (167.6 cm) Weight: 53.6 kg (118 lb 2.7 oz) IBW/kg (Calculated) : 59.3 Heparin dosing weight: 48 kg   Vital Signs: Temp: 98.5 F (36.9 C) (11/19 0320) Temp Source: Oral (11/19 0320) BP: 136/74 (11/19 0600) Pulse Rate: 81 (11/19 0600)  Labs: Recent Labs    12/10/21 1158 12/10/21 1536 12/10/21 2055 12/10/21 2055 12/10/21 2340 12/11/21 0239 12/11/21 0756 12/11/21 1021 12/11/21 1644 12/12/21 0700 12/12/21 1449 12/12/21 2038 12/13/21 0524  HGB  --   --   --    < >  --   --  8.6*  --   --  8.9*  --   --  9.4*  HCT  --   --   --   --   --   --  25.5*  --   --  26.8*  --   --  28.3*  PLT  --   --   --   --   --   --  199  --   --  178  --   --  168  APTT 44*  --  50*  --   --   --  73*  --   --   --   --   --   --   HEPARINUNFRC 0.45  --  0.38  --   --   --  0.24*  --    < > 0.23* 0.32 0.35 0.25*  CREATININE 1.16*   < >  --   --    < > 0.57 0.61 0.49  --  0.35*  --   --  0.36*  CKTOTAL  --   --   --   --   --  80  --   --   --   --   --   --   --   CKMB  --   --   --   --   --  2.2  --   --   --   --   --   --   --    < > = values in this interval not displayed.     Estimated Creatinine Clearance: 69.6 mL/min (A) (by C-G formula based on SCr of 0.36 mg/dL (L)).   Medical History: Past Medical History:  Diagnosis Date   Coronary artery disease    Diabetes mellitus without complication (HCC)    Diabetes mellitus, type 2 (HCC)    HFrEF (heart failure with reduced ejection fraction) ([place])    Hyperlipemia    Hypertension    Ischemic cardiomyopathy    Orthostatic hypotension    PAD (peripheral artery disease) (HCC)    Tobacco use      Assessment: 51 y/o F s/p fall, tx from [facility] to [facility], had hypotension requiring transfer to the ICU, holding apixaban and starting heparin.  Heparin level  subtherapeutic at 0.25 units/mL while on heparin 1200 units/hr. CBC stable with no signs of bleeding.  Goal of Therapy:  Heparin level: 0.3-0.7 Monitor platelets by anticoagulation protocol: Yes   Plan:  Increase heparin to 1350 units/hr.  Check 6hr heparin level at 1400 Monitor H & H daily.  Follow up transition back to home Eliquis.   Erskine Speed, PharmD Clinical Pharmacist 12/13/2021 7:11 AM

## 2021-12-13 NOTE — Evaluation (Signed)
Clinical/Bedside Swallow Evaluation Patient Details  Name: Beth Carroll MRN: [id number] Date of Birth: 1970-04-18  Today's Date: 12/13/2021 Time: SLP Start Time (ACUTE ONLY): 32 SLP Stop Time (ACUTE ONLY): 1330 SLP Time Calculation (min) (ACUTE ONLY): 15 min  Past Medical History:  Past Medical History:  Diagnosis Date   Coronary artery disease    Diabetes mellitus without complication ([place])    Diabetes mellitus, type 2 ([place])    HFrEF (heart failure with reduced ejection fraction) ([place])    Hyperlipemia    Hypertension    Ischemic cardiomyopathy    Orthostatic hypotension    PAD (peripheral artery disease) ([place])    Tobacco use    Past Surgical History:  Past Surgical History:  Procedure Laterality Date   CORONARY STENT INTERVENTION N/A 12/15/2020   Procedure: CORONARY STENT INTERVENTION;  Surgeon: Wellington Hampshire, MD;  Location: [place] [facility];  Service: Cardiovascular;  Laterality: N/A;   LEFT HEART CATH AND CORONARY ANGIOGRAPHY N/A 12/15/2020   Procedure: LEFT HEART CATH AND CORONARY ANGIOGRAPHY;  Surgeon: Wellington Hampshire, MD;  Location: [place] [facility];  Service: Cardiovascular;  Laterality: N/A;   LOWER EXTREMITY ANGIOGRAPHY Left 12/10/2020   Procedure: Lower Extremity Angiography;  Surgeon: Algernon Huxley, MD;  Location: [place] [facility];  Service: Cardiovascular;  Laterality: Left;   LOWER EXTREMITY ANGIOGRAPHY Right 12/25/2020   Procedure: LOWER EXTREMITY ANGIOGRAPHY;  Surgeon: Algernon Huxley, MD;  Location: [place] [facility];  Service: Cardiovascular;  Laterality: Right;   LOWER EXTREMITY ANGIOGRAPHY Right 08/19/2021   Procedure: Lower Extremity Angiography;  Surgeon: Algernon Huxley, MD;  Location: [place] [facility];  Service: Cardiovascular;  Laterality: Right;   LOWER EXTREMITY ANGIOGRAPHY Right 08/19/2021   Procedure: Lower Extremity Angiography;  Surgeon: Algernon Huxley, MD;  Location: [place] [facility];  Service: Cardiovascular;  Laterality: Right;    HPI:  Patient is a 51 y.o. female with PMH: CAD, DM-2, HTN, orthostatic hypotension, PAD, GERD, tobacco use who initially presented to [facility] ED from home after fall three days prior leading to inability to ambulate due to severe right knee pain. IN ED at [facility], work-up revealed complex comminuted intra-articular fracture of right lateral femoral condyle and large knee lipohemarthroisis. Orthopedic surgery at [facility] recommended transfer for complicated fracture. Patient transfered to [facility] Med Surg unit and while there, was noted to be hypothermic with temperature of 90 degreesF and hypotensive with SBP less than 90. Patient was lethargic and concern for developing septic shock. She was transferred to ICU for higher level of care. Cortak in place and plan to remove when consistently tolerating PO diet. SLP swallow evaluation ordered.    Assessment / Plan / Recommendation  Clinical Impression  Patient is not currently presenting with clinical s/s of dysphagia as per this bedside swallow evaluation. SLP observed her with PO intake of thin liquids via straw sips (soda) and regular solids (crackers). Oral phase WNL, swallow initiation was timely and no overt s/s aspiration or penetration. She reported that her PO intake today has been good and she has a good appetite. SLP asked about the 100lb weight loss that was in initial evaluation note and patient said, "that was 20-30 years ago". She denies any recent significant weight loss and reports "I eat like a horse". SLP not recommending any further skilled intervention. SLP Visit Diagnosis: Dysphagia, unspecified (R13.10)    Aspiration Risk  No limitations    Diet Recommendation Regular;Thin liquid   Liquid Administration via: Cup;Straw  Medication Administration: Whole meds with liquid Supervision: Patient able to self feed Postural Changes: Seated upright at 90 degrees    Other  Recommendations Oral Care Recommendations: Oral care BID    Recommendations  for follow up therapy are one component of a multi-disciplinary discharge planning process, led by the attending physician.  Recommendations may be updated based on patient status, additional functional criteria and insurance authorization.  Follow up Recommendations No SLP follow up      Assistance Recommended at Discharge  N/A  Functional Status Assessment Patient has not had a recent decline in their functional status  Frequency and Duration   N/A         Prognosis   N/A     Swallow Study   General Date of Onset: 12/10/21 HPI: Patient is a 51 y.o. female with PMH: CAD, DM-2, HTN, orthostatic hypotension, PAD, GERD, tobacco use who initially presented to [facility] ED from home after fall three days prior leading to inability to ambulate due to severe right knee pain. IN ED at [facility], work-up revealed complex comminuted intra-articular fracture of right lateral femoral condyle and large knee lipohemarthroisis. Orthopedic surgery at [facility] recommended transfer for complicated fracture. Patient transfered to [facility] Med Surg unit and while there, was noted to be hypothermic with temperature of 90 degreesF and hypotensive with SBP less than 90. Patient was lethargic and concern for developing septic shock. She was transferred to ICU for higher level of care. Cortak in place and plan to remove when consistently tolerating PO diet. SLP swallow evaluation ordered. Type of Study: Bedside Swallow Evaluation Previous Swallow Assessment: none found Diet Prior to this Study: Regular;Thin liquids Temperature Spikes Noted: No Respiratory Status: Room air Behavior/Cognition: Alert;Cooperative;Pleasant mood Oral Cavity Assessment: Within Functional Limits Oral Cavity - Dentition: Adequate natural dentition Vision: Functional for self-feeding Self-Feeding Abilities: Able to feed self Patient Positioning: Upright in bed Baseline Vocal Quality: Normal Volitional Cough: Strong Volitional Swallow: Able to elicit     Oral/Motor/Sensory Function Overall Oral Motor/Sensory Function: Within functional limits   Ice Chips     Thin Liquid Thin Liquid: Within functional limits Presentation: Straw;Self Fed    Nectar Thick     Honey Thick     Puree     Solid     Solid: Within functional limits Presentation: [place], MA, CCC-SLP Speech Therapy

## 2021-12-13 NOTE — Progress Notes (Addendum)
[facility]                                                                              Beth Carroll, is a 51 y.o. female, DOB - [birth year], HEN:[id number] Admit date - 12/09/2021    Outpatient Primary MD for the patient is Baxter Hire, MD  LOS - 4  days  No chief complaint on file.      Brief summary   Patient is a 51 year old female with a history of poorly controlled DM type 2, chronic systolic CHF, PAD status post peripheral stenting, tobacco use disorder, reportedly has lost over 100 pounds of weight according to the daughter's boyfriend with significant failure to thrive, severe cachexia smoker quit 1 year ago and COPD not otherwise specified but not on oxygen, HTN, HLP.  Patient has chronic right heel ischemic ulceration, status post thrombolysis and thrombectomy for lower lobe claudication in July 2023, benign meningioma occipital lobe. Patient was admitted to [facility] ER on 11/14 after a fall on 11/8 and right knee pain.  Patient had spent few days in recliner and upon EMS arrival was found to be saturated in urine.  In ED, was discovered to have right femoral condyle fracture along with UTI, stage 1-2 sacral wound.  Initially alert with normal vital signs, she was started on pain control and IV Rocephin for UTI.  Patient was then subsequently transferred to [facility] and was found to be hypothermic, hypotensive, lethargic and weak.  ABG with severe metabolic acidosis and CCM was consulted.  11/15 admitted to [facility] as transfer from [facility] for fracture repair 11/15 overnight transferred from [facility] med surg to ICU for hypotension, acidosis, lethargy.  Was placed on vasopressors, found to have DKA. Urine culture 11/14 >> E. Coli, pan-sens Blood cultures 11/16 >> ngtd   Assessment & Plan    Principal Problem:   DKA, type 2 ([place]) in the setting of diabetes mellitus type 2, uncontrolled with hyperglycemia Anion gap metabolic acidosis -Hemoglobin A1c 8.1 on  12/10/2021.  Patient was placed on aggressive IV fluids and IV insulin for DKA and volume resuscitation. -Now transitioned to Levemir 10 units twice daily and sliding scale insulin -Patient is alert and oriented, wants to eat, currently on tube feeds -SLP evaluation, will place on carb modified diet.  Once consistently tolerating oral diet, will DC tube feeds -Will hold Farxiga and Ozempic at discharge and will need to follow-up with her endocrinologist, Dr. Gabriel Carina  Active Problems: Acute kidney injury, hypokalemia -Potassium 2.8, creatinine 1.3 on admission, plateaued at 1.45 -Now improving, potassium 3.3, will replace -Creatinine improving 0.35   Severe hypophosphatemia -Likely due to severe protein calorie malnutrition, -Replaced IV with K-Phos  Septic shock secondary to UTI -Vasopressors now off, urine culture showed E. Coli -Complete 7-day course of IV Rocephin    Hypertension -BP elevated, placed on IV hydralazine as needed with parameters -Resume Coreg, continue IV Lasix    Bilateral lower extremity edema, acute on chronic combined systolic and diastolic CHF -Likely fluid overload due to aggressive fluid resuscitation, septic shock on admit - I's and O's with 7.8 L positive, was placed on Lasix  40 mg IV daily on 11/18 -Continue strict I's and O's and daily weights, follow renal function -2D echo 06/2021 had shown EF of 40 to 45% with G2 DD   Traumatic closed fracture of femoral condyle with minimal displacement, Right knee -Will need Ortho consult and surgery for the femoral fracture repair -d/w Dr Alvan Dame, plan for ortho to evaluate and possible surgery tomorrow     PAD on anticoagulation -Currently on IV heparin drip, will continue until Ortho evaluation    Acute metabolic encephalopathy -Currently resolved, alert and oriented x3   COPD, tobacco use disorder -Currently stable, as needed DuoNebs -Counseled on smoking cessation  Pressure injury documentation Sacrum  stage II, POA Right buttocks deep tissue pressure injury, POA -Management per nursing and wound care   Severe protein calorie malnutrition Nutrition Problem: Severe Malnutrition Etiology: chronic illness (CHF, COPD) Signs/Symptoms: severe fat depletion, severe muscle depletion Interventions: Tube feeding, MVI, Other (Comment) (checking vitamin and mineral labs) Estimated body mass index is 19.07 kg/m as calculated from the following:   Height as of this encounter: '5\' 6"'$  (1.676 m).   Weight as of this encounter: 53.6 kg.  Code Status:  full code  DVT Prophylaxis:  On IV heparin drip  Level of Care: Level of care: Progressive Family Communication: Updated patient.  Daughter present in the room  Disposition Plan:      Remains inpatient appropriate: Needs Ortho evaluation   Procedures:  Consultants:   PCCM  Antimicrobials:   Anti-infectives (From admission, onward)    Start     Dose/Rate Route Frequency Ordered Stop   12/11/21 1400  cefTRIAXone (ROCEPHIN) 2 g in sodium chloride 0.9 % 100 mL IVPB        2 g 200 mL/hr over 30 Minutes Intravenous Every 24 hours 12/11/21 1312 12/16/21 2359   12/10/21 2200  vancomycin (VANCOREADY) IVPB 1250 mg/250 mL  Status:  Discontinued        1,250 mg 166.7 mL/hr over 90 Minutes Intravenous Every 24 hours 12/10/21 0233 12/10/21 0842   12/10/21 0315  piperacillin-tazobactam (ZOSYN) IVPB 3.375 g  Status:  Discontinued        3.375 g 12.5 mL/hr over 240 Minutes Intravenous Every 8 hours 12/10/21 0219 12/11/21 1312   12/10/21 0315  vancomycin (VANCOCIN) IVPB 1000 mg/200 mL premix        1,000 mg 200 mL/hr over 60 Minutes Intravenous  Once 12/10/21 0219 12/10/21 0355   12/09/21 2359  cefTRIAXone (ROCEPHIN) 2 g in sodium chloride 0.9 % 100 mL IVPB  Status:  Discontinued        2 g 200 mL/hr over 30 Minutes Intravenous Daily at bedtime 12/09/21 2312 12/10/21 0135          Medications  budesonide (PULMICORT) nebulizer solution  0.25 mg  Nebulization BID   carvedilol  3.125 mg Oral BID WC   Chlorhexidine Gluconate Cloth  6 each Topical Daily   feeding supplement (PROSource TF20)  60 mL Per Tube Daily   [START ON 25/85/2778] folic acid  1 mg Oral Daily   furosemide  40 mg Intravenous Daily   insulin aspart  2-6 Units Subcutaneous Q4H   insulin detemir  10 Units Subcutaneous BID   leptospermum manuka honey  1 Application Topical Daily   [START ON 12/14/2021] multivitamin with minerals  1 tablet Oral Daily   [START ON 12/14/2021] pantoprazole  40 mg Oral Daily   potassium chloride  20 mEq Oral BID   [START ON 12/14/2021] thiamine  100 mg Oral Daily      Subjective:   Beth Carroll was seen and examined today.  Alert and oriented, wants to try and eat.  Still has core track, on tube feeds.  BP somewhat elevated.  No acute chest pain, shortness of breath, nausea vomiting, fevers.   Objective:   Vitals:   12/13/21 0700 12/13/21 0800 12/13/21 0900 12/13/21 1000  BP: 121/69 137/73 (!) 141/77 (!) 170/103  Pulse: 79 81 79 90  Resp: '11 14 11 15  '$ Temp: 98.2 F (36.8 C)     TempSrc: Oral     SpO2: 98% 98% 97% 97%  Weight:      Height:        Intake/Output Summary (Last 24 hours) at 12/13/2021 1044 Last data filed at 12/13/2021 1042 Gross per 24 hour  Intake 2383.63 ml  Output 3375 ml  Net -991.37 ml     Wt Readings from Last 3 Encounters:  12/13/21 53.6 kg  09/18/21 53.5 kg  08/26/21 56.9 kg     Exam General: Alert and oriented x 3, NAD, NGT+, ill appearing  Cardiovascular: S1 S2 auscultated,  RRR Respiratory: Clear to auscultation bilaterally, no wheezing Gastrointestinal: Soft, nontender, nondistended, + bowel sounds Ext: ++ pedal edema bilaterally Neuro: alert and oriented. Strength 5/5 upper extremities, states can't wiggle toes, pain in rt knee  Skin: dressing right knee  Psych: Normal affect     Data Reviewed:  I have personally reviewed following labs    CBC Lab Results  Component Value  Date   WBC 3.7 (L) 12/13/2021   RBC 3.32 (L) 12/13/2021   HGB 9.4 (L) 12/13/2021   HCT 28.3 (L) 12/13/2021   MCV 85.2 12/13/2021   MCH 28.3 12/13/2021   PLT 168 12/13/2021   MCHC 33.2 12/13/2021   RDW 15.8 (H) 12/13/2021   LYMPHSABS 0.6 (L) 12/10/2021   MONOABS 0.4 12/10/2021   EOSABS 0.0 12/10/2021   BASOSABS 0.0 32/95/1884     Last metabolic panel Lab Results  Component Value Date   NA 144 12/13/2021   K 3.6 12/13/2021   CL 107 12/13/2021   CO2 29 12/13/2021   BUN 16 12/13/2021   CREATININE 0.36 (L) 12/13/2021   GLUCOSE 184 (H) 12/13/2021   GFRNONAA >60 12/13/2021   CALCIUM 8.7 (L) 12/13/2021   PHOS 3.4 12/13/2021   PROT 4.9 (L) 12/11/2021   ALBUMIN 2.3 (L) 12/11/2021   BILITOT 0.4 12/11/2021   ALKPHOS 56 12/11/2021   AST 17 12/11/2021   ALT 10 12/11/2021   ANIONGAP 8 12/13/2021    CBG (last 3)  Recent Labs    12/12/21 2322 12/13/21 0320 12/13/21 0727  GLUCAP 165* 184* 168*      Coagulation Profile: No results for input(s): "INR", "PROTIME" in the last 168 hours.   Radiology Studies: I have personally reviewed the imaging studies  DG Abd Portable 1V  Result Date: 12/11/2021 CLINICAL DATA:  Feeding tube placement EXAM: PORTABLE ABDOMEN - 1 VIEW COMPARISON:  None Available. FINDINGS: Feeding tube tip is in the distal stomach. Visualized abdomen and pelvis with nonobstructive bowel-gas pattern visualized lung bases are clear. IMPRESSION: Feeding tube tip is in the distal stomach. Electronically Signed   By: Yetta Glassman M.D.   On: 12/11/2021 16:09       Beth Carroll M.D. [facility] 12/13/2021, 10:44 AM  Available via Epic secure chat 7am-7pm After 7 pm, please refer to night coverage provider listed on amion.

## 2021-12-13 NOTE — Progress Notes (Signed)
ANTICOAGULATION CONSULT NOTE - Follow Up Consult  Pharmacy Consult for Heparin (Apixaban on hold) Indication:  PAD  No Known Allergies  Patient Measurements: Height: '5\' 6"'$  (167.6 cm) Weight: 53.6 kg (118 lb 2.7 oz) IBW/kg (Calculated) : 59.3 Heparin dosing weight: 48 kg   Vital Signs: Temp: 98.2 F (36.8 C) (11/19 0700) Temp Source: Oral (11/19 0700) BP: 87/59 (11/19 1403) Pulse Rate: 80 (11/19 1403)  Labs: Recent Labs    12/10/21 2055 12/10/21 2055 12/10/21 2340 12/11/21 0239 12/11/21 0756 12/11/21 1021 12/11/21 1644 12/12/21 0700 12/12/21 1449 12/12/21 2038 12/13/21 0524 12/13/21 1437  HGB  --    < >  --   --  8.6*  --   --  8.9*  --   --  9.4*  --   HCT  --   --   --   --  25.5*  --   --  26.8*  --   --  28.3*  --   PLT  --   --   --   --  199  --   --  178  --   --  168  --   APTT 50*  --   --   --  73*  --   --   --   --   --   --   --   HEPARINUNFRC 0.38  --   --   --  0.24*  --    < > 0.23*   < > 0.35 0.25* 0.37  CREATININE  --   --    < > 0.57 0.61 0.49  --  0.35*  --   --  0.36*  --   CKTOTAL  --   --   --  80  --   --   --   --   --   --   --   --   CKMB  --   --   --  2.2  --   --   --   --   --   --   --   --    < > = values in this interval not displayed.     Estimated Creatinine Clearance: 69.6 mL/min (A) (by C-G formula based on SCr of 0.36 mg/dL (L)).   Medical History: Past Medical History:  Diagnosis Date   Coronary artery disease    Diabetes mellitus without complication (HCC)    Diabetes mellitus, type 2 (HCC)    HFrEF (heart failure with reduced ejection fraction) ([place])    Hyperlipemia    Hypertension    Ischemic cardiomyopathy    Orthostatic hypotension    PAD (peripheral artery disease) (HCC)    Tobacco use      Assessment: 51 y/o F s/p fall, tx from [facility] to [facility], had hypotension requiring transfer to the ICU, holding apixaban and starting heparin.  Heparin level 0.38 at goal on heparin drip 1350 units/mL. CBC stable with no  signs of bleeding.  Goal of Therapy:  Heparin level: 0.3-0.7 Monitor platelets by anticoagulation protocol: Yes   Plan:  Continue heparin 1350 units/hr.  Monitor H & H daily.  Follow up transition back to home Eliquis.     Bonnita Nasuti Pharm.D. CPP, BCPS Clinical Pharmacist [phone] 12/13/2021 3:41 PM

## 2021-12-14 ENCOUNTER — Inpatient Hospital Stay (HOSPITAL_COMMUNITY): Payer: Self-pay | Admitting: Certified Registered Nurse Anesthetist

## 2021-12-14 ENCOUNTER — Inpatient Hospital Stay (HOSPITAL_COMMUNITY): Payer: Self-pay

## 2021-12-14 ENCOUNTER — Other Ambulatory Visit: Payer: Self-pay

## 2021-12-14 ENCOUNTER — Encounter (HOSPITAL_COMMUNITY)
Admission: RE | Disposition: A | Discharge status: Home Health | Payer: Self-pay | Source: Home / Self Care | Attending: Internal Medicine

## 2021-12-14 DIAGNOSIS — Z87891 Personal history of nicotine dependence: Secondary | ICD-10-CM

## 2021-12-14 DIAGNOSIS — S72401A Unspecified fracture of lower end of right femur, initial encounter for closed fracture: Secondary | ICD-10-CM

## 2021-12-14 DIAGNOSIS — I509 Heart failure, unspecified: Secondary | ICD-10-CM

## 2021-12-14 DIAGNOSIS — I110 Hypertensive heart disease with heart failure: Secondary | ICD-10-CM

## 2021-12-14 DIAGNOSIS — I2510 Atherosclerotic heart disease of native coronary artery without angina pectoris: Secondary | ICD-10-CM

## 2021-12-14 HISTORY — PX: ORIF FEMUR FRACTURE: SHX2119

## 2021-12-14 LAB — CBC
HCT: 28.3 % — ABNORMAL LOW (ref 36.0–46.0)
Hemoglobin: 8.9 g/dL — ABNORMAL LOW (ref 12.0–15.0)
MCH: 27.6 pg (ref 26.0–34.0)
MCHC: 31.4 g/dL (ref 30.0–36.0)
MCV: 87.9 fL (ref 80.0–100.0)
Platelets: 169 10*3/uL (ref 150–400)
RBC: 3.22 MIL/uL — ABNORMAL LOW (ref 3.87–5.11)
RDW: 15.5 % (ref 11.5–15.5)
WBC: 3.9 10*3/uL — ABNORMAL LOW (ref 4.0–10.5)
nRBC: 0 % (ref 0.0–0.2)

## 2021-12-14 LAB — TYPE AND SCREEN
ABO/RH(D): A POS
Antibody Screen: NEGATIVE

## 2021-12-14 LAB — ABO/RH: ABO/RH(D): A POS

## 2021-12-14 LAB — SURGICAL PCR SCREEN
MRSA, PCR: NEGATIVE
Staphylococcus aureus: NEGATIVE

## 2021-12-14 LAB — GLUCOSE, CAPILLARY
Glucose-Capillary: 136 mg/dL — ABNORMAL HIGH (ref 70–99)
Glucose-Capillary: 84 mg/dL (ref 70–99)
Glucose-Capillary: 82 mg/dL (ref 70–99)
Glucose-Capillary: 117 mg/dL — ABNORMAL HIGH (ref 70–99)
Glucose-Capillary: 144 mg/dL — ABNORMAL HIGH (ref 70–99)
Glucose-Capillary: 162 mg/dL — ABNORMAL HIGH (ref 70–99)
Glucose-Capillary: 355 mg/dL — ABNORMAL HIGH (ref 70–99)

## 2021-12-14 LAB — COMPREHENSIVE METABOLIC PANEL
ALT: 10 U/L (ref 0–44)
AST: 14 U/L — ABNORMAL LOW (ref 15–41)
Albumin: 2.2 g/dL — ABNORMAL LOW (ref 3.5–5.0)
Alkaline Phosphatase: 48 U/L (ref 38–126)
Anion gap: 8 (ref 5–15)
BUN: 16 mg/dL (ref 6–20)
CO2: 31 mmol/L (ref 22–32)
Calcium: 8.8 mg/dL — ABNORMAL LOW (ref 8.9–10.3)
Chloride: 101 mmol/L (ref 98–111)
Creatinine, Ser: 0.47 mg/dL (ref 0.44–1.00)
GFR, Estimated: >60 mL/min (ref >60)
Glucose, Bld: 174 mg/dL — ABNORMAL HIGH (ref 70–99)
Potassium: 3.6 mmol/L (ref 3.5–5.1)
Sodium: 140 mmol/L (ref 135–145)
Total Bilirubin: 0.2 mg/dL — ABNORMAL LOW (ref 0.3–1.2)
Total Protein: 5.2 g/dL — ABNORMAL LOW (ref 6.5–8.1)

## 2021-12-14 LAB — HEPARIN LEVEL (UNFRACTIONATED): Heparin Unfractionated: 0.35 IU/mL (ref 0.30–0.70)

## 2021-12-14 LAB — PHOSPHORUS: Phosphorus: 3.2 mg/dL (ref 2.5–4.6)

## 2021-12-14 LAB — MAGNESIUM: Magnesium: 1.9 mg/dL (ref 1.7–2.4)

## 2021-12-14 SURGERY — OPEN REDUCTION INTERNAL FIXATION (ORIF) DISTAL FEMUR FRACTURE
Anesthesia: General | Site: Leg Lower | Laterality: Right

## 2021-12-14 MED ORDER — ROCURONIUM BROMIDE 10 MG/ML (PF) SYRINGE
PREFILLED_SYRINGE | INTRAVENOUS | Status: AC
  Filled 2021-12-14: qty 10

## 2021-12-14 MED ORDER — METHOCARBAMOL 1000 MG/10ML IJ SOLN
500.0000 mg | Freq: Four times a day (QID) | INTRAVENOUS | Status: DC | PRN
  Filled 2021-12-14: qty 5

## 2021-12-14 MED ORDER — ROCURONIUM BROMIDE 10 MG/ML (PF) SYRINGE
PREFILLED_SYRINGE | INTRAVENOUS | Status: DC | PRN
  Administered 2021-12-14: 30 mg via INTRAVENOUS

## 2021-12-14 MED ORDER — FENTANYL CITRATE (PF) 100 MCG/2ML IJ SOLN
INTRAMUSCULAR | Status: AC
  Filled 2021-12-14: qty 2

## 2021-12-14 MED ORDER — PHENYLEPHRINE 80 MCG/ML (10ML) SYRINGE FOR IV PUSH (FOR BLOOD PRESSURE SUPPORT)
PREFILLED_SYRINGE | INTRAVENOUS | Status: AC
  Filled 2021-12-14: qty 20

## 2021-12-14 MED ORDER — TRANEXAMIC ACID-NACL 1000-0.7 MG/100ML-% IV SOLN
1000.0000 mg | INTRAVENOUS | Status: AC
  Administered 2021-12-14: 1000 mg via INTRAVENOUS
  Filled 2021-12-14: qty 100

## 2021-12-14 MED ORDER — ONDANSETRON HCL 4 MG/2ML IJ SOLN
INTRAMUSCULAR | Status: AC
  Filled 2021-12-14: qty 2

## 2021-12-14 MED ORDER — PHENYLEPHRINE HCL-NACL 20-0.9 MG/250ML-% IV SOLN
INTRAVENOUS | Status: DC | PRN
  Administered 2021-12-14: 40 ug/min via INTRAVENOUS

## 2021-12-14 MED ORDER — ENSURE ENLIVE PO LIQD
237.0000 mL | Freq: Three times a day (TID) | ORAL | Status: DC
  Administered 2021-12-15 – 2021-12-18 (×5): 237 mL via ORAL

## 2021-12-14 MED ORDER — LACTATED RINGERS IV SOLN: INTRAVENOUS | Status: DC

## 2021-12-14 MED ORDER — ORAL CARE MOUTH RINSE: 15.0000 mL | Freq: Once | OROMUCOSAL | Status: AC

## 2021-12-14 MED ORDER — FENTANYL CITRATE (PF) 100 MCG/2ML IJ SOLN
25.0000 ug | INTRAMUSCULAR | Status: DC | PRN
  Administered 2021-12-14 (×3): 50 ug via INTRAVENOUS

## 2021-12-14 MED ORDER — INSULIN DETEMIR 100 UNIT/ML ~~LOC~~ SOLN
10.0000 [IU] | Freq: Two times a day (BID) | SUBCUTANEOUS | Status: DC
  Administered 2021-12-14: 10 [IU] via SUBCUTANEOUS
  Filled 2021-12-14 (×3): qty 0.1

## 2021-12-14 MED ORDER — METOCLOPRAMIDE HCL 5 MG/ML IJ SOLN: 5.0000 mg | Freq: Three times a day (TID) | INTRAMUSCULAR | Status: DC | PRN

## 2021-12-14 MED ORDER — METHOCARBAMOL 500 MG PO TABS
500.0000 mg | ORAL_TABLET | Freq: Four times a day (QID) | ORAL | Status: DC | PRN
  Administered 2021-12-14 – 2021-12-18 (×8): 500 mg via ORAL
  Filled 2021-12-14 (×8): qty 1

## 2021-12-14 MED ORDER — VANCOMYCIN HCL 1000 MG IV SOLR
INTRAVENOUS | Status: AC
  Filled 2021-12-14: qty 20

## 2021-12-14 MED ORDER — LIDOCAINE 2% (20 MG/ML) 5 ML SYRINGE
INTRAMUSCULAR | Status: DC | PRN
  Administered 2021-12-14: 40 mg via INTRAVENOUS

## 2021-12-14 MED ORDER — 0.9 % SODIUM CHLORIDE (POUR BTL) OPTIME
TOPICAL | Status: DC | PRN
  Administered 2021-12-14: 1000 mL

## 2021-12-14 MED ORDER — DEXAMETHASONE SODIUM PHOSPHATE 10 MG/ML IJ SOLN
INTRAMUSCULAR | Status: AC
  Filled 2021-12-14: qty 1

## 2021-12-14 MED ORDER — LIDOCAINE 2% (20 MG/ML) 5 ML SYRINGE
INTRAMUSCULAR | Status: AC
  Filled 2021-12-14: qty 5

## 2021-12-14 MED ORDER — FENTANYL CITRATE (PF) 250 MCG/5ML IJ SOLN
INTRAMUSCULAR | Status: AC
  Filled 2021-12-14: qty 5

## 2021-12-14 MED ORDER — SUCCINYLCHOLINE CHLORIDE 200 MG/10ML IV SOSY
PREFILLED_SYRINGE | INTRAVENOUS | Status: AC
  Filled 2021-12-14: qty 10

## 2021-12-14 MED ORDER — CHLORHEXIDINE GLUCONATE 0.12 % MT SOLN
15.0000 mL | Freq: Once | OROMUCOSAL | Status: AC
  Administered 2021-12-14: 15 mL via OROMUCOSAL

## 2021-12-14 MED ORDER — CEFAZOLIN SODIUM-DEXTROSE 2-4 GM/100ML-% IV SOLN
2.0000 g | INTRAVENOUS | Status: AC
  Administered 2021-12-14: 2 g via INTRAVENOUS
  Filled 2021-12-14 (×2): qty 100

## 2021-12-14 MED ORDER — ONDANSETRON HCL 4 MG/2ML IJ SOLN: 4.0000 mg | Freq: Four times a day (QID) | INTRAMUSCULAR | Status: DC | PRN

## 2021-12-14 MED ORDER — SUGAMMADEX SODIUM 200 MG/2ML IV SOLN
INTRAVENOUS | Status: DC | PRN
  Administered 2021-12-14: 115 mg via INTRAVENOUS

## 2021-12-14 MED ORDER — POTASSIUM CHLORIDE CRYS ER 20 MEQ PO TBCR
40.0000 meq | EXTENDED_RELEASE_TABLET | Freq: Once | ORAL | Status: DC
  Filled 2021-12-14: qty 2

## 2021-12-14 MED ORDER — PHENYLEPHRINE 80 MCG/ML (10ML) SYRINGE FOR IV PUSH (FOR BLOOD PRESSURE SUPPORT)
PREFILLED_SYRINGE | INTRAVENOUS | Status: DC | PRN
  Administered 2021-12-14: 80 ug via INTRAVENOUS
  Administered 2021-12-14: 160 ug via INTRAVENOUS
  Administered 2021-12-14 (×2): 80 ug via INTRAVENOUS

## 2021-12-14 MED ORDER — ONDANSETRON HCL 4 MG/2ML IJ SOLN
INTRAMUSCULAR | Status: DC | PRN
  Administered 2021-12-14: 4 mg via INTRAVENOUS

## 2021-12-14 MED ORDER — METOCLOPRAMIDE HCL 5 MG PO TABS: 5.0000 mg | ORAL_TABLET | Freq: Three times a day (TID) | ORAL | Status: DC | PRN

## 2021-12-14 MED ORDER — PROPOFOL 10 MG/ML IV BOLUS
INTRAVENOUS | Status: DC | PRN
  Administered 2021-12-14: 120 mg via INTRAVENOUS

## 2021-12-14 MED ORDER — ONDANSETRON HCL 4 MG PO TABS: 4.0000 mg | ORAL_TABLET | Freq: Four times a day (QID) | ORAL | Status: DC | PRN

## 2021-12-14 MED ORDER — DEXAMETHASONE SODIUM PHOSPHATE 10 MG/ML IJ SOLN
INTRAMUSCULAR | Status: DC | PRN
  Administered 2021-12-14: 5 mg via INTRAVENOUS

## 2021-12-14 MED ORDER — DOCUSATE SODIUM 100 MG PO CAPS
100.0000 mg | ORAL_CAPSULE | Freq: Two times a day (BID) | ORAL | Status: DC
  Administered 2021-12-15 – 2021-12-16 (×2): 100 mg via ORAL
  Filled 2021-12-14 (×7): qty 1

## 2021-12-14 MED ORDER — CHLORHEXIDINE GLUCONATE 4 % EX LIQD
60.0000 mL | Freq: Once | CUTANEOUS | Status: AC
  Administered 2021-12-14: 4 via TOPICAL
  Filled 2021-12-14: qty 60

## 2021-12-14 MED ORDER — MIDAZOLAM HCL 2 MG/2ML IJ SOLN
INTRAMUSCULAR | Status: DC | PRN
  Administered 2021-12-14: 1 mg via INTRAVENOUS

## 2021-12-14 MED ORDER — FENTANYL CITRATE (PF) 250 MCG/5ML IJ SOLN
INTRAMUSCULAR | Status: DC | PRN
  Administered 2021-12-14 (×2): 50 ug via INTRAVENOUS
  Administered 2021-12-14: 100 ug via INTRAVENOUS

## 2021-12-14 MED ORDER — SUCCINYLCHOLINE CHLORIDE 200 MG/10ML IV SOSY
PREFILLED_SYRINGE | INTRAVENOUS | Status: DC | PRN
  Administered 2021-12-14: 80 mg via INTRAVENOUS

## 2021-12-14 MED ORDER — MIDAZOLAM HCL 2 MG/2ML IJ SOLN
INTRAMUSCULAR | Status: AC
  Filled 2021-12-14: qty 2

## 2021-12-14 MED ORDER — VANCOMYCIN HCL 1000 MG IV SOLR
INTRAVENOUS | Status: DC | PRN
  Administered 2021-12-14: 1000 mg

## 2021-12-14 MED ORDER — POVIDONE-IODINE 10 % EX SWAB
2.0000 | Freq: Once | CUTANEOUS | Status: AC
  Administered 2021-12-14: 2 via TOPICAL

## 2021-12-14 MED ORDER — JUVEN PO PACK
1.0000 | PACK | Freq: Two times a day (BID) | ORAL | Status: DC
  Administered 2021-12-15 – 2021-12-18 (×8): 1 via ORAL
  Filled 2021-12-14 (×7): qty 1

## 2021-12-14 SURGICAL SUPPLY — 78 items
BAG COUNTER SPONGE SURGICOUNT (BAG) ×1 IMPLANT
BIT DRILL EVOS SHORT 2.0 (BIT) ×1
BIT DRILL QC 2.5MM SHRT EVO SM (DRILL) IMPLANT
BIT DRILL SRG SHRT 2XAO CNCT (BIT) IMPLANT
BIT DRL SRG SHRT 2XAO QCK CNCT (BIT) ×1
BLADE CLIPPER SURG (BLADE) IMPLANT
BNDG COHESIVE 6X5 TAN STRL LF (GAUZE/BANDAGES/DRESSINGS) ×1 IMPLANT
BNDG ELASTIC 4X5.8 VLCR STR LF (GAUZE/BANDAGES/DRESSINGS) IMPLANT
BNDG ELASTIC 6X10 VLCR STRL LF (GAUZE/BANDAGES/DRESSINGS) ×1 IMPLANT
BNDG ELASTIC 6X5.8 VLCR STR LF (GAUZE/BANDAGES/DRESSINGS) IMPLANT
BRUSH SCRUB EZ PLAIN DRY (MISCELLANEOUS) ×2 IMPLANT
CANISTER SUCT 3000ML PPV (MISCELLANEOUS) ×1 IMPLANT
CHLORAPREP W/TINT 26 (MISCELLANEOUS) ×1 IMPLANT
COVER SURGICAL LIGHT HANDLE (MISCELLANEOUS) ×1 IMPLANT
DERMABOND ADVANCED .7 DNX12 (GAUZE/BANDAGES/DRESSINGS) IMPLANT
DRAPE C-ARM 42X72 X-RAY (DRAPES) ×1 IMPLANT
DRAPE C-ARMOR (DRAPES) ×1 IMPLANT
DRAPE HALF SHEET 40X57 (DRAPES) ×2 IMPLANT
DRAPE ORTHO SPLIT 77X108 STRL (DRAPES) ×2
DRAPE SURG 17X23 STRL (DRAPES) ×1 IMPLANT
DRAPE SURG ORHT 6 SPLT 77X108 (DRAPES) ×2 IMPLANT
DRAPE U-SHAPE 47X51 STRL (DRAPES) ×1 IMPLANT
DRESSING MEPILEX FLEX 4X4 (GAUZE/BANDAGES/DRESSINGS) IMPLANT
DRILL QC 2.5MM SHORT EVOS SM (DRILL) ×1
DRSG ADAPTIC 3X8 NADH LF (GAUZE/BANDAGES/DRESSINGS) IMPLANT
DRSG MEPILEX BORDER 4X12 (GAUZE/BANDAGES/DRESSINGS) IMPLANT
DRSG MEPILEX BORDER 4X4 (GAUZE/BANDAGES/DRESSINGS) IMPLANT
DRSG MEPILEX BORDER 4X8 (GAUZE/BANDAGES/DRESSINGS) IMPLANT
DRSG MEPILEX FLEX 4X4 (GAUZE/BANDAGES/DRESSINGS) ×1
DRSG MEPILEX POST OP 4X8 (GAUZE/BANDAGES/DRESSINGS) IMPLANT
ELECT REM PT RETURN 9FT ADLT (ELECTROSURGICAL) ×1
ELECTRODE REM PT RTRN 9FT ADLT (ELECTROSURGICAL) ×1 IMPLANT
GAUZE PAD ABD 8X10 STRL (GAUZE/BANDAGES/DRESSINGS) ×3 IMPLANT
GAUZE SPONGE 4X4 12PLY STRL (GAUZE/BANDAGES/DRESSINGS) ×1 IMPLANT
GLOVE BIO SURGEON STRL SZ 6.5 (GLOVE) ×3 IMPLANT
GLOVE BIO SURGEON STRL SZ7.5 (GLOVE) ×4 IMPLANT
GLOVE BIOGEL PI IND STRL 6.5 (GLOVE) ×1 IMPLANT
GLOVE BIOGEL PI IND STRL 7.5 (GLOVE) ×1 IMPLANT
GOWN STRL REUS W/ TWL LRG LVL3 (GOWN DISPOSABLE) ×3 IMPLANT
GOWN STRL REUS W/TWL LRG LVL3 (GOWN DISPOSABLE) ×3
K-WIRE 1.6 (WIRE) ×3
K-WIRE FX150X1.6XTROC PNT (WIRE) ×3
KIT BASIN OR (CUSTOM PROCEDURE TRAY) ×1 IMPLANT
KIT TURNOVER KIT B (KITS) ×1 IMPLANT
KWIRE FX150X1.6XTROC PNT (WIRE) IMPLANT
NS IRRIG 1000ML POUR BTL (IV SOLUTION) ×1 IMPLANT
PACK TOTAL JOINT (CUSTOM PROCEDURE TRAY) ×1 IMPLANT
PAD ARMBOARD 7.5X6 YLW CONV (MISCELLANEOUS) ×2 IMPLANT
PAD CAST 4YDX4 CTTN HI CHSV (CAST SUPPLIES) ×1 IMPLANT
PADDING CAST COTTON 4X4 STRL (CAST SUPPLIES) ×1
PADDING CAST COTTON 6X4 STRL (CAST SUPPLIES) ×1 IMPLANT
PLATE T EVOS 2.7 5H SFT ST (Plate) IMPLANT
PLATE TIB PART EVOS 2.7X74 3H (Plate) IMPLANT
SCREW CORT ST EVOS 3.5X46 (Screw) IMPLANT
SCREW CORT ST EVOS 3.5X55 (Screw) IMPLANT
SCREW CORT ST EVOS 3.5X70 (Screw) IMPLANT
SCREW CTX 3.5X50MM EVOS (Screw) IMPLANT
SCREW EVOS 2.7 X 55 LCK T8 S-T (Screw) IMPLANT
SCREW LOCK 2.7X30 (Screw) IMPLANT
SCREW LOCK ST EVOS 2.7X34 (Screw) IMPLANT
SCREW LOCK ST EVOS 2.7X36 (Screw) IMPLANT
SCREW LOCK ST EVOS 2.7X65 (Screw) IMPLANT
SCREW LOCK ST EVOS 2.7X70 (Screw) IMPLANT
SCREW LOCK ST EVOS 3.5X42 (Screw) IMPLANT
SPONGE T-LAP 18X18 ~~LOC~~+RFID (SPONGE) IMPLANT
STAPLER VISISTAT 35W (STAPLE) ×1 IMPLANT
SUCTION FRAZIER HANDLE 10FR (MISCELLANEOUS) ×1
SUCTION TUBE FRAZIER 10FR DISP (MISCELLANEOUS) ×1 IMPLANT
SUT ETHILON 3 0 PS 1 (SUTURE) ×2 IMPLANT
SUT VIC AB 0 CT1 27 (SUTURE) ×1
SUT VIC AB 0 CT1 27XBRD ANBCTR (SUTURE) IMPLANT
SUT VIC AB 1 CT1 27 (SUTURE) ×1
SUT VIC AB 1 CT1 27XBRD ANBCTR (SUTURE) IMPLANT
SUT VIC AB 2-0 CT1 27 (SUTURE) ×1
SUT VIC AB 2-0 CT1 TAPERPNT 27 (SUTURE) ×2 IMPLANT
TOWEL GREEN STERILE (TOWEL DISPOSABLE) ×2 IMPLANT
TRAY FOLEY MTR SLVR 16FR STAT (SET/KITS/TRAYS/PACK) IMPLANT
WATER STERILE IRR 1000ML POUR (IV SOLUTION) ×2 IMPLANT

## 2021-12-14 NOTE — H&P (View-Only) (Signed)
Reason for Consult:Right femoral condyle fx Referring Physician: Estill Cotta Time called: 0801 Time at bedside: [place]   Beth Carroll is an 52 y.o. female.  HPI: Beth Carroll fell trying to get into a truck on 11/11. She had immediate right knee pain and couldn't bear weight. She tried to tough it out at home but it wasn't getting better and she was brought to [facility]. Workup showed a femoral condyle fx. Orthopedic surgery was consulted and she was transferred to [facility] for definitive care. She was transferred to [facility] 11/15 and that night required transfer to ICU for septic shock. She is now improved and stable for surgery. She c/o right knee pain. She was using a RW prior to this fall following vascular surgery earlier this year.  Past Medical History:  Diagnosis Date   Coronary artery disease    Diabetes mellitus without complication ([place])    Diabetes mellitus, type 2 (HCC)    HFrEF (heart failure with reduced ejection fraction) ([place])    Hyperlipemia    Hypertension    Ischemic cardiomyopathy    Orthostatic hypotension    PAD (peripheral artery disease) ([place])    Tobacco use     Past Surgical History:  Procedure Laterality Date   CORONARY STENT INTERVENTION N/A 12/15/2020   Procedure: CORONARY STENT INTERVENTION;  Surgeon: Wellington Hampshire, MD;  Location: [place] [facility];  Service: Cardiovascular;  Laterality: N/A;   LEFT HEART CATH AND CORONARY ANGIOGRAPHY N/A 12/15/2020   Procedure: LEFT HEART CATH AND CORONARY ANGIOGRAPHY;  Surgeon: Wellington Hampshire, MD;  Location: [place] [facility];  Service: Cardiovascular;  Laterality: N/A;   LOWER EXTREMITY ANGIOGRAPHY Left 12/10/2020   Procedure: Lower Extremity Angiography;  Surgeon: Algernon Huxley, MD;  Location: [place] [facility];  Service: Cardiovascular;  Laterality: Left;   LOWER EXTREMITY ANGIOGRAPHY Right 12/25/2020   Procedure: LOWER EXTREMITY ANGIOGRAPHY;  Surgeon: Algernon Huxley, MD;  Location: [place] [facility];  Service:  Cardiovascular;  Laterality: Right;   LOWER EXTREMITY ANGIOGRAPHY Right 08/19/2021   Procedure: Lower Extremity Angiography;  Surgeon: Algernon Huxley, MD;  Location: [place] [facility];  Service: Cardiovascular;  Laterality: Right;   LOWER EXTREMITY ANGIOGRAPHY Right 08/19/2021   Procedure: Lower Extremity Angiography;  Surgeon: Algernon Huxley, MD;  Location: [place] [facility];  Service: Cardiovascular;  Laterality: Right;    Family History  Problem Relation Age of Onset   Hypertension Father     Social History:  reports that she quit smoking about a year ago. Her smoking use included cigarettes. She has never used smokeless tobacco. She reports that she does not drink alcohol. No history on file for drug use.  Allergies: No Known Allergies  Medications: I have reviewed the patient's current medications.  Results for orders placed or performed during the hospital encounter of 12/09/21 (from the past 48 hour(s))  Glucose, capillary     Status: Abnormal   Collection Time: 12/12/21 11:30 AM  Result Value Ref Range   Glucose-Capillary 179 (H) 70 - 99 mg/dL    Comment: Glucose reference range applies only to samples taken after fasting for at least 8 hours.   Comment 1 Notify RN   Heparin level (unfractionated)     Status: None   Collection Time: 12/12/21  2:49 PM  Result Value Ref Range   Heparin Unfractionated 0.32 0.30 - 0.70 IU/mL    Comment: (NOTE) The clinical reportable range upper limit is being lowered to >1.10 to align with  the FDA approved guidance for the current laboratory assay.  If heparin results are below expected values, and patient dosage has  been confirmed, suggest follow up testing of antithrombin III levels. Performed at [facility], [place] [address] [place], [place] [address]   Glucose, capillary     Status: Abnormal   Collection Time: 12/12/21  3:17 PM  Result Value Ref Range   Glucose-Capillary 135 (H) 70 - 99 mg/dL    Comment: Glucose reference  range applies only to samples taken after fasting for at least 8 hours.   Comment 1 Notify RN   Magnesium     Status: None   Collection Time: 12/12/21  6:24 PM  Result Value Ref Range   Magnesium 2.1 1.7 - 2.4 mg/dL    Comment: Performed at [facility], [place] [address], [place], [place] [address]  Phosphorus     Status: Abnormal   Collection Time: 12/12/21  6:24 PM  Result Value Ref Range   Phosphorus 2.4 (L) 2.5 - 4.6 mg/dL    Comment: Performed at [facility] [address] [place], [place] [address]  Glucose, capillary     Status: Abnormal   Collection Time: 12/12/21  7:34 PM  Result Value Ref Range   Glucose-Capillary 130 (H) 70 - 99 mg/dL    Comment: Glucose reference range applies only to samples taken after fasting for at least 8 hours.  Heparin level (unfractionated)     Status: None   Collection Time: 12/12/21  8:38 PM  Result Value Ref Range   Heparin Unfractionated 0.35 0.30 - 0.70 IU/mL    Comment: (NOTE) The clinical reportable range upper limit is being lowered to >1.10 to align with the FDA approved guidance for the current laboratory assay.  If heparin results are below expected values, and patient dosage has  been confirmed, suggest follow up testing of antithrombin III levels. Performed at [facility], [place] [address] [place], [place] [address]   Glucose, capillary     Status: Abnormal   Collection Time: 12/12/21 11:22 PM  Result Value Ref Range   Glucose-Capillary 165 (H) 70 - 99 mg/dL    Comment: Glucose reference range applies only to samples taken after fasting for at least 8 hours.  Glucose, capillary     Status: Abnormal   Collection Time: 12/13/21  3:20 AM  Result Value Ref Range   Glucose-Capillary 184 (H) 70 - 99 mg/dL    Comment: Glucose reference range applies only to samples taken after fasting for at least 8 hours.  Heparin level (unfractionated)     Status: Abnormal   Collection Time: 12/13/21  5:24 AM  Result Value Ref  Range   Heparin Unfractionated 0.25 (L) 0.30 - 0.70 IU/mL    Comment: (NOTE) The clinical reportable range upper limit is being lowered to >1.10 to align with the FDA approved guidance for the current laboratory assay.  If heparin results are below expected values, and patient dosage has  been confirmed, suggest follow up testing of antithrombin III levels. Performed at [facility], [place] [address], [place], [place] [address]   Magnesium     Status: None   Collection Time: 12/13/21  5:24 AM  Result Value Ref Range   Magnesium 2.0 1.7 - 2.4 mg/dL    Comment: Performed at [facility] [address] [place], [place] [address]  Phosphorus     Status: None   Collection Time:  12/13/21  5:24 AM  Result Value Ref Range   Phosphorus 3.4 2.5 - 4.6 mg/dL    Comment: Performed at [facility], [place] [address] [place], [place] [address]  CBC     Status: Abnormal   Collection Time: 12/13/21  5:24 AM  Result Value Ref Range   WBC 3.7 (L) 4.0 - 10.5 K/uL   RBC 3.32 (L) 3.87 - 5.11 MIL/uL   Hemoglobin 9.4 (L) 12.0 - 15.0 g/dL   HCT 28.3 (L) 36.0 - 46.0 %   MCV 85.2 80.0 - 100.0 fL   MCH 28.3 26.0 - 34.0 pg   MCHC 33.2 30.0 - 36.0 g/dL   RDW 15.8 (H) 11.5 - 15.5 %   Platelets 168 150 - 400 K/uL   nRBC 0.0 0.0 - 0.2 %    Comment: Performed at [facility], [place] [address] [place], [place] [address]  Basic metabolic panel     Status: Abnormal   Collection Time: 12/13/21  5:24 AM  Result Value Ref Range   Sodium 144 135 - 145 mmol/L   Potassium 3.6 3.5 - 5.1 mmol/L   Chloride 107 98 - 111 mmol/L   CO2 29 22 - 32 mmol/L   Glucose, Bld 184 (H) 70 - 99 mg/dL    Comment: Glucose reference range applies only to samples taken after fasting for at least 8 hours.   BUN 16 6 - 20 mg/dL   Creatinine, Ser 0.36 (L) 0.44 - 1.00 mg/dL   Calcium 8.7 (L) 8.9 - 10.3 mg/dL   GFR, Estimated >60 >60 mL/min    Comment: (NOTE) Calculated using the CKD-EPI Creatinine Equation  (2021)    Anion gap 8 5 - 15    Comment: Performed at [facility] [address], [place], [place] [address]  Glucose, capillary     Status: Abnormal   Collection Time: 12/13/21  7:27 AM  Result Value Ref Range   Glucose-Capillary 168 (H) 70 - 99 mg/dL    Comment: Glucose reference range applies only to samples taken after fasting for at least 8 hours.  Glucose, capillary     Status: Abnormal   Collection Time: 12/13/21 11:29 AM  Result Value Ref Range   Glucose-Capillary 259 (H) 70 - 99 mg/dL    Comment: Glucose reference range applies only to samples taken after fasting for at least 8 hours.  Magnesium     Status: None   Collection Time: 12/13/21  2:37 PM  Result Value Ref Range   Magnesium 1.9 1.7 - 2.4 mg/dL    Comment: Performed at [facility], [place] [address] [place], [place] [address]  Phosphorus     Status: None   Collection Time: 12/13/21  2:37 PM  Result Value Ref Range   Phosphorus 3.3 2.5 - 4.6 mg/dL    Comment: Performed at [facility], [place] [address], [place], [place] [address]  Heparin level (unfractionated)     Status: None   Collection Time: 12/13/21  2:37 PM  Result Value Ref Range   Heparin Unfractionated 0.37 0.30 - 0.70 IU/mL    Comment: (NOTE) The clinical reportable range upper limit is being lowered to >1.10 to align with the FDA approved guidance for the current laboratory assay.  If heparin results are below expected values, and patient dosage has  been confirmed, suggest follow up testing of antithrombin III levels. Performed at [facility], [place] [address], [place], [place]  [address]   Glucose, capillary     Status: Abnormal   Collection Time: 12/13/21  4:43 PM  Result Value Ref Range   Glucose-Capillary 263 (H) 70 - 99 mg/dL    Comment: Glucose reference range applies only to samples taken after fasting for at least 8 hours.  Glucose, capillary     Status: Abnormal   Collection Time: 12/13/21  7:30 PM  Result Value  Ref Range   Glucose-Capillary 177 (H) 70 - 99 mg/dL    Comment: Glucose reference range applies only to samples taken after fasting for at least 8 hours.  Heparin level (unfractionated)     Status: None   Collection Time: 12/14/21  5:10 AM  Result Value Ref Range   Heparin Unfractionated 0.35 0.30 - 0.70 IU/mL    Comment: (NOTE) The clinical reportable range upper limit is being lowered to >1.10 to align with the FDA approved guidance for the current laboratory assay.  If heparin results are below expected values, and patient dosage has  been confirmed, suggest follow up testing of antithrombin III levels. Performed at [facility], [place] [address], [place], [place] [address]   Magnesium     Status: None   Collection Time: 12/14/21  5:10 AM  Result Value Ref Range   Magnesium 1.9 1.7 - 2.4 mg/dL    Comment: Performed at [facility] [address] [place], [place] [address]  Phosphorus     Status: None   Collection Time: 12/14/21  5:10 AM  Result Value Ref Range   Phosphorus 3.2 2.5 - 4.6 mg/dL    Comment: Performed at [facility] [address] [place], [place] [address]  Comprehensive metabolic panel     Status: Abnormal   Collection Time: 12/14/21  5:10 AM  Result Value Ref Range   Sodium 140 135 - 145 mmol/L   Potassium 3.6 3.5 - 5.1 mmol/L   Chloride 101 98 - 111 mmol/L   CO2 31 22 - 32 mmol/L   Glucose, Bld 174 (H) 70 - 99 mg/dL    Comment: Glucose reference range applies only to samples taken after fasting for at least 8 hours.   BUN 16 6 - 20 mg/dL   Creatinine, Ser 0.47 0.44 - 1.00 mg/dL   Calcium 8.8 (L) 8.9 - 10.3 mg/dL   Total Protein 5.2 (L) 6.5 - 8.1 g/dL   Albumin 2.2 (L) 3.5 - 5.0 g/dL   AST 14 (L) 15 - 41 U/L   ALT 10 0 - 44 U/L   Alkaline Phosphatase 48 38 - 126 U/L   Total Bilirubin 0.2 (L) 0.3 - 1.2 mg/dL   GFR, Estimated >60 >60 mL/min    Comment: (NOTE) Calculated using the CKD-EPI Creatinine Equation (2021)    Anion gap 8 5  - 15    Comment: Performed at [facility], [place] [address] [place], [place] [address]  CBC     Status: Abnormal   Collection Time: 12/14/21  5:10 AM  Result Value Ref Range   WBC 3.9 (L) 4.0 - 10.5 K/uL   RBC 3.22 (L) 3.87 - 5.11 MIL/uL   Hemoglobin 8.9 (L) 12.0 - 15.0 g/dL   HCT 28.3 (L) 36.0 - 46.0 %   MCV 87.9 80.0 - 100.0 fL   MCH 27.6 26.0 - 34.0 pg   MCHC 31.4 30.0 - 36.0 g/dL   RDW 15.5 11.5 - 15.5 %   Platelets 169 150 - 400 K/uL  nRBC 0.0 0.0 - 0.2 %    Comment: Performed at [facility], [place] [address], [place], [place] [address]  Glucose, capillary     Status: Abnormal   Collection Time: 12/14/21  7:35 AM  Result Value Ref Range   Glucose-Capillary 136 (H) 70 - 99 mg/dL    Comment: Glucose reference range applies only to samples taken after fasting for at least 8 hours.    No results found.  Review of Systems  HENT:  Negative for ear discharge, ear pain, hearing loss and tinnitus.   Eyes:  Negative for photophobia and pain.  Respiratory:  Negative for cough and shortness of breath.   Cardiovascular:  Negative for chest pain.  Gastrointestinal:  Negative for abdominal pain, nausea and vomiting.  Genitourinary:  Negative for dysuria, flank pain, frequency and urgency.  Musculoskeletal:  Positive for arthralgias (Right knee). Negative for back pain, myalgias and neck pain.  Neurological:  Negative for dizziness and headaches.  Hematological:  Does not bruise/bleed easily.  Psychiatric/Behavioral:  The patient is not nervous/anxious.    Blood pressure 124/66, pulse 75, temperature 98.6 F (37 C), temperature source Oral, resp. rate (!) 9, height '5\' 6"'$  (1.676 m), weight 52.9 kg, SpO2 98 %. Physical Exam Constitutional:      General: She is not in acute distress.    Appearance: She is well-developed. She is not diaphoretic.  HENT:     Head: Normocephalic and atraumatic.  Eyes:     General: No scleral icterus.       Right eye: No discharge.         Left eye: No discharge.     Conjunctiva/sclera: Conjunctivae normal.  Cardiovascular:     Rate and Rhythm: Normal rate and regular rhythm.  Pulmonary:     Effort: Pulmonary effort is normal. No respiratory distress.  Musculoskeletal:     Cervical back: Normal range of motion.     Comments: RLE No traumatic wounds, ecchymosis, or rash  ACE in place  No ankle effusion  Sens DPN, SPN, TN intact  Motor EHL, ext, flex, evers grossly intact  DP 1+, PT 0, 3+ pitting edema  Skin:    General: Skin is warm and dry.  Neurological:     Mental Status: She is alert.  Psychiatric:        Mood and Affect: Mood normal.        Behavior: Behavior normal.    Assessment/Plan: Right femoral condyle fx -- Plan ORIF today with Dr. Doreatha Martin. Multiple medical problems including poorly controlled DM type 2, chronic systolic CHF, PAD status post peripheral stenting, failure to thrive, severe cachexia, smoker quit 1 year ago and COPD not otherwise specified but not on oxygen, HTN, and HLP -- per primary service. They have cleared for surgery when possible.    Lisette Abu, PA-C Orthopedic Surgery [phone] 12/14/2021, 9:07 AM

## 2021-12-14 NOTE — Progress Notes (Signed)
ANTICOAGULATION CONSULT NOTE  Pharmacy Consult for Heparin Indication:  PAD  No Known Allergies  Patient Measurements: Height: '5\' 6"'$  (167.6 cm) Weight: 52.9 kg (116 lb 10 oz) IBW/kg (Calculated) : 59.3 Heparin dosing weight: 48 kg   Vital Signs: Temp: 98.6 F (37 C) (11/20 0736) Temp Source: Oral (11/20 0736) BP: 157/81 (11/20 0900) Pulse Rate: 83 (11/20 0900)  Labs: Recent Labs    12/12/21 0700 12/12/21 1449 12/13/21 0524 12/13/21 1437 12/14/21 0510  HGB 8.9*  --  9.4*  --  8.9*  HCT 26.8*  --  28.3*  --  28.3*  PLT 178  --  168  --  169  HEPARINUNFRC 0.23*   < > 0.25* 0.37 0.35  CREATININE 0.35*  --  0.36*  --  0.47   < > = values in this interval not displayed.     Estimated Creatinine Clearance: 68.7 mL/min (by C-G formula based on SCr of 0.47 mg/dL).   Assessment: 51 y/o F s/p fall, tx from [facility] to [facility], had hypotension requiring transfer to the ICU, holding apixaban and bridging with IV heparin.  Heparin level therapeutic and low normal. CBC stable with no signs of bleeding.  Goal of Therapy:  Heparin level: 0.3-0.7 units/mL Monitor platelets by anticoagulation protocol: Yes   Plan:  Increase heparin gtt slightly to 1400 units/hr Daily heparin level and CBC Follow up transition back to home Eliquis after ORIF  Yaser Harvill D. Mina Marble, PharmD, BCPS, [place] 12/14/2021, 9:29 AM

## 2021-12-14 NOTE — Progress Notes (Signed)
[facility]                                                                              Beth Carroll, is a 51 y.o. female, DOB - [birth year], NWG:[id number] Admit date - 12/09/2021    Outpatient Primary MD for the patient is Baxter Hire, MD  LOS - 5  days  No chief complaint on file.      Brief summary   Patient is a 51 year old female with a history of poorly controlled DM type 2, chronic systolic CHF, PAD status post peripheral stenting, tobacco use disorder, reportedly has lost over 100 pounds of weight according to the daughter's boyfriend with significant failure to thrive, severe cachexia smoker quit 1 year ago and COPD not otherwise specified but not on oxygen, HTN, HLP.  Patient has chronic right heel ischemic ulceration, status post thrombolysis and thrombectomy for lower lobe claudication in July 2023, benign meningioma occipital lobe. Patient was admitted to [facility] ER on 11/14 after a fall on 11/8 and right knee pain.  Patient had spent few days in recliner and upon EMS arrival was found to be saturated in urine.  In ED, was discovered to have right femoral condyle fracture along with UTI, stage 1-2 sacral wound.  Initially alert with normal vital signs, she was started on pain control and IV Rocephin for UTI.  Patient was then subsequently transferred to [facility] and was found to be hypothermic, hypotensive, lethargic and weak.  ABG with severe metabolic acidosis and CCM was consulted.  11/15 admitted to [facility] as transfer from [facility] for fracture repair 11/15 overnight transferred from [facility] med surg to ICU for hypotension, acidosis, lethargy.  Was placed on vasopressors, found to have DKA. Urine culture 11/14 >> E. Coli, pan-sens Blood cultures 11/16 >> ngtd   Assessment & Plan    Principal Problem:   DKA, type 2 ([place]) in the setting of diabetes mellitus type 2, uncontrolled with hyperglycemia Anion gap metabolic acidosis -Hemoglobin A1c 8.1 on  12/10/2021.  Patient was placed on aggressive IV fluids and IV insulin for DKA and volume resuscitation. -SLP evaluation completed, on 11/19, cortrack and tube feeds discontinued. -Started on carb modified diet -Will hold Farxiga and Ozempic at discharge and will need to follow-up with her endocrinologist, Dr. Gabriel Carina -Continue sliding scale insulin, Levemir 10 units twice daily  Active Problems:   Traumatic closed fracture of femoral condyle with minimal displacement, Right knee -Plan for OR today, n.p.o. -PT evaluation after surgery  Acute kidney injury, hypokalemia -Potassium 2.8, creatinine 1.3 on admission, plateaued at 1.45 -Potassium 3.6, creatinine improved to 0.47   Severe hypophosphatemia -Likely due to severe protein calorie malnutrition, -Replaced IV with K-Phos -Now improving, 3.2  Septic shock secondary to UTI -Vasopressors now off, urine culture showed E. Coli -Complete 7-day course of IV Rocephin    Hypertension -BP elevated, placed on IV hydralazine as needed with parameters -Resume Coreg, continue IV Lasix    Bilateral lower extremity edema, acute on chronic combined systolic and diastolic CHF -Likely fluid overload due to aggressive fluid resuscitation, septic shock on admit - I's and O's with 7.2 L  positive, placed on Lasix 40 mg IV daily  x3 days -2D echo 06/2021 had shown EF of 40 to 45% with G2 DD -On Lasix 20 mg daily PTA, will resume tomorrow  PAD on anticoagulation -Currently on IV heparin drip, will continue until Ortho evaluation -Was on Eliquis  PTA    Acute metabolic encephalopathy -Currently resolved, alert and oriented x3  COPD, tobacco use disorder -Currently stable, as needed DuoNebs -Counseled on smoking cessation  Pressure injury documentation Sacrum stage II, POA Right buttocks deep tissue pressure injury, POA -Management per nursing and wound care   Severe protein calorie malnutrition Nutrition Problem: Severe  Malnutrition Etiology: chronic illness (CHF, COPD) Signs/Symptoms: severe fat depletion, severe muscle depletion Interventions: Tube feeding, MVI, Other (Comment) (checking vitamin and mineral labs) Estimated body mass index is 18.82 kg/m as calculated from the following:   Height as of this encounter: '5\' 6"'$  (1.676 m).   Weight as of this encounter: 52.9 kg.  Code Status:  full code  DVT Prophylaxis:  On IV heparin drip  Level of Care: Level of care: Progressive Family Communication: Updated patient.  Daughter present in the room  Disposition Plan:      Remains inpatient appropriate: Pending OR today   Procedures:  Consultants:   PCCM Orthopedics  Antimicrobials:   Anti-infectives (From admission, onward)    Start     Dose/Rate Route Frequency Ordered Stop   12/15/21 0600  ceFAZolin (ANCEF) IVPB 2g/100 mL premix        2 g 200 mL/hr over 30 Minutes Intravenous On call to O.R. 12/14/21 1154 12/16/21 0559   12/11/21 1400  cefTRIAXone (ROCEPHIN) 2 g in sodium chloride 0.9 % 100 mL IVPB        2 g 200 mL/hr over 30 Minutes Intravenous Every 24 hours 12/11/21 1312 12/16/21 2359   12/10/21 2200  vancomycin (VANCOREADY) IVPB 1250 mg/250 mL  Status:  Discontinued        1,250 mg 166.7 mL/hr over 90 Minutes Intravenous Every 24 hours 12/10/21 0233 12/10/21 0842   12/10/21 0315  piperacillin-tazobactam (ZOSYN) IVPB 3.375 g  Status:  Discontinued        3.375 g 12.5 mL/hr over 240 Minutes Intravenous Every 8 hours 12/10/21 0219 12/11/21 1312   12/10/21 0315  vancomycin (VANCOCIN) IVPB 1000 mg/200 mL premix        1,000 mg 200 mL/hr over 60 Minutes Intravenous  Once 12/10/21 0219 12/10/21 0355   12/09/21 2359  cefTRIAXone (ROCEPHIN) 2 g in sodium chloride 0.9 % 100 mL IVPB  Status:  Discontinued        2 g 200 mL/hr over 30 Minutes Intravenous Daily at bedtime 12/09/21 2312 12/10/21 0135          Medications  budesonide (PULMICORT) nebulizer solution  0.25 mg Nebulization  BID   chlorhexidine  60 mL Topical Once   Chlorhexidine Gluconate Cloth  6 each Topical Daily   folic acid  1 mg Oral Daily   furosemide  40 mg Intravenous Daily   insulin aspart  0-5 Units Subcutaneous QHS   insulin aspart  0-9 Units Subcutaneous TID WC   insulin detemir  10 Units Subcutaneous BID   leptospermum manuka honey  1 Application Topical Daily   multivitamin with minerals  1 tablet Oral Daily   pantoprazole  40 mg Oral Daily   potassium chloride  40 mEq Oral Once   povidone-iodine  2 Application Topical Once   thiamine  100 mg Oral Daily  Subjective:   Beth Carroll was seen and examined today.  Feels great today, talking on the phone prior to my encounter.  Tolerating diet, core track out and tube feeds off.  Looking forward to getting the surgery done today.  Objective:   Vitals:   12/14/21 1030 12/14/21 1100 12/14/21 1109 12/14/21 1130  BP:  131/74    Pulse: 77 79  81  Resp: '11 10  12  '$ Temp:   98 F (36.7 C)   TempSrc:   Oral   SpO2: 99% 98%  100%  Weight:      Height:        Intake/Output Summary (Last 24 hours) at 12/14/2021 1206 Last data filed at 12/14/2021 1200 Gross per 24 hour  Intake 1136.79 ml  Output 1000 ml  Net 136.79 ml     Wt Readings from Last 3 Encounters:  12/14/21 52.9 kg  09/18/21 53.5 kg  08/26/21 56.9 kg    Physical Exam General: Alert and oriented x 3, NAD Cardiovascular: S1 S2 clear, RRR.  Respiratory: CTAB, no wheezing, rales or rhonchi Gastrointestinal: Soft, nontender, nondistended, NBS Ext: 2+pedal edema bilaterally Psych: Normal affect     Data Reviewed:  I have personally reviewed following labs    CBC Lab Results  Component Value Date   WBC 3.9 (L) 12/14/2021   RBC 3.22 (L) 12/14/2021   HGB 8.9 (L) 12/14/2021   HCT 28.3 (L) 12/14/2021   MCV 87.9 12/14/2021   MCH 27.6 12/14/2021   PLT 169 12/14/2021   MCHC 31.4 12/14/2021   RDW 15.5 12/14/2021   LYMPHSABS 0.6 (L) 12/10/2021   MONOABS 0.4  12/10/2021   EOSABS 0.0 12/10/2021   BASOSABS 0.0 61/44/3154     Last metabolic panel Lab Results  Component Value Date   NA 140 12/14/2021   K 3.6 12/14/2021   CL 101 12/14/2021   CO2 31 12/14/2021   BUN 16 12/14/2021   CREATININE 0.47 12/14/2021   GLUCOSE 174 (H) 12/14/2021   GFRNONAA >60 12/14/2021   CALCIUM 8.8 (L) 12/14/2021   PHOS 3.2 12/14/2021   PROT 5.2 (L) 12/14/2021   ALBUMIN 2.2 (L) 12/14/2021   BILITOT 0.2 (L) 12/14/2021   ALKPHOS 48 12/14/2021   AST 14 (L) 12/14/2021   ALT 10 12/14/2021   ANIONGAP 8 12/14/2021    CBG (last 3)  Recent Labs    12/13/21 1930 12/14/21 0735 12/14/21 1108  GLUCAP 177* 136* 84      Coagulation Profile: No results for input(s): "INR", "PROTIME" in the last 168 hours.   Radiology Studies: I have personally reviewed the imaging studies  No results found.     Estill Cotta M.D. [facility] 12/14/2021, 12:06 PM  Available via Epic secure chat 7am-7pm After 7 pm, please refer to night coverage provider listed on amion.

## 2021-12-14 NOTE — Op Note (Signed)
Orthopaedic Surgery Operative Note (CSN: [id number] ) Date of Surgery: 12/14/2021  Admit Date: 12/09/2021   Diagnoses: Pre-Op Diagnoses: Right lateral femoral condyle fracture  Post-Op Diagnosis: Same  Procedures: CPT 27514-Open reduction internal fixation of right lateral femoral condyle fracture  Surgeons : Primary: Shona Needles, MD  Assistant: Patrecia Pace, PA-C  Location: OR 3   Anesthesia:General   Antibiotics: Ancef 2g preop with 1 gm vancomycin powder placed topically   Tourniquet time: None    Estimated Blood Loss: 50 mL  Complications:none   Specimens:None   Implants: Implant Name Type Inv. Item Serial No. Manufacturer Lot No. LRB No. Used Action  PLATE T EVOS 2.7 5H SFT ST - [id number] Plate PLATE T EVOS 2.7 5H SFT ST  [employer] [id number] Right 1 Implanted  PLATE T EVOS 2.7 5H SFT ST - [id number] Plate PLATE T EVOS 2.7 5H SFT ST  [employer]  Right 1 Implanted  SCREW EVOS 2.7 X 55 LCK T8 S-T - [id number] Screw SCREW EVOS 2.7 X 55 LCK T8 S-T  [employer]  Right 1 Implanted  SCREW LOCK ST EVOS 2.7X65 - [id number] Screw SCREW LOCK ST EVOS 2.7X65  [employer]  Right 2 Implanted  SCREW LOCK ST EVOS 2.7X36 - [id number] Screw SCREW LOCK ST EVOS 2.7X36  [employer]  Right 1 Implanted  SCREW LOCK 2.7X30 - [id number] Screw SCREW LOCK 2.7X30  [employer]  Right 1 Implanted  SCREW LOCK ST EVOS 2.7X34 - [id number] Screw SCREW LOCK ST EVOS 2.7X34  [employer]  Right 1 Implanted  SCREW LOCK ST EVOS 2.7X70 - [id number] Screw SCREW LOCK ST EVOS 2.7X70  [employer]  Right 1 Implanted  SCREW CORT ST EVOS 3.5X70 - [id number] Screw SCREW CORT ST EVOS 3.5X70  [employer]  Right 1 Implanted  SCREW CTX 3.5X50MM EVOS - [id number] Screw SCREW CTX 3.5X50MM EVOS  [employer]  Right 1 Implanted  PLATE TIB PART EVOS  2.7X74 3H - [id number] Plate PLATE TIB PART EVOS 3.3A07 3H  [employer]  Right 1 Implanted  SCREW CORT ST EVOS 3.5X46 - [id number] Screw SCREW CORT ST EVOS 3.5X46  [employer]  Right 1 Implanted  SCREW LOCK ST EVOS 3.5X42 - [id number] Screw SCREW LOCK ST EVOS 3.5X42  [employer]  Right 1 Implanted     Indications for Surgery: 51 year old female with multiple medical comorbidities who sustained a right lateral femoral condyle fracture.  Due to the unstable nature of her injury and significant displacement I recommend proceeding with open reduction internal fixation.  She initially presented with a significant illness and was septic upon arrival.  She subsequently recovered from this into his cleared for surgery.  Risks and benefits were discussed with the patient.  Risks included but not limited to bleeding, infection, malunion, nonunion, hardware failure, hardware irritation, nerve or blood vessel injury, DVT, even the possibility anesthetic complications.  She agreed to proceed with surgery consent was obtained.  Operative Findings: Open reduction internal fixation of right lateral femoral condyle fracture using [employer] mini frag 2.60m T plate for condylar fixation with an overlying EVOS anterior distal tibial plate to buttress the provisional fixation  Procedure: The patient was identified in the preoperative holding area. Consent was confirmed with the patient and their family and all questions were answered. The operative extremity was marked after confirmation  with the patient. she was then brought back to the operating room by our anesthesia colleagues.  She was placed under general anesthetic and carefully transferred over to radiolucent flat top table.  A bump was placed under her operative hip.  The right lower extremity was then prepped and draped in usual sterile fashion.  A timeout was performed to verify the patient, the procedure,  and the extremity.  Preoperative antibiotics were dosed.  Fluoroscopic imaging showed the unstable nature of her injury.  A lateral parapatellar incision was then made and carried down through skin and subcutaneous tissue.  I incised through the joint capsule and entered the knee joint itself.  I exposed the lateral condyle and extended down into the knee joint to visualize the articular surface.  I then proceeded to use a Cobb elevator to free the fracture and manipulated back into reduction.  I held it provisionally with a 1.6 mm K wires.  I then fixed the lateral condyle fracture using 3.5 millimeter screws from anterior to posterior.  However these did not have much fixation as her bone quality was significantly poor.  As result I removed these and then went on to I primary fixation by using the [facility] & Nephew EVOS 2.7 mm mini frag T plate.  I position this were the T portion of the plate was along the femoral condyle.  I held provisionally with K wires and then drilled and placed locking screws across the condyles anteriorly and into the femoral condyle posteriorly.  Excellent fixation was obtained however I did want to prevent any displacement of the plate as result I then buttress this with a [employer] EVOS 3.5/2.7 mm anterior distal tibial locking plate.  I positioned this appropriately placed a nonlocking screw into the metaphysis to bring it flush to bone.  I then proceeded to place locking and nonlocking screws into the femoral shaft.  I then placed a 3.5 mm locking screw in the posterior condyle.  I then placed a 2.7 millimeter screw through the plate and through the T plate to reinforce the fixation.  Final fluoroscopic imaging was obtained.  The incision was copiously irrigated.  A gram of vancomycin powder was placed in the incision.  Layered closure of 0 Vicryl, 2-0 Vicryl and 3-0 nylon was used to close the skin sterile dressings were applied.  The patient was then awoke from anesthesia and  taken to the PACU in stable condition.  Post Op Plan/Instructions: The patient will be nonweightbearing to the right lower extremity.  She will have Ancef for surgical prophylaxis.  I will recommend aspirin for DVT prophylaxis.  We will have her mobilize with physical and Occupational Therapy.  I was present and performed the entire surgery.  Patrecia Pace, PA-C did assist me throughout the case. An assistant was necessary given the difficulty in approach, maintenance of reduction and ability to instrument the fracture.   Katha Hamming, MD [facility]

## 2021-12-14 NOTE — Interval H&P Note (Signed)
History and Physical Interval Note:  12/14/2021 3:24 PM  Beth Carroll  has presented today for surgery, with the diagnosis of Right distal femur fracture.  The various methods of treatment have been discussed with the patient and family. After consideration of risks, benefits and other options for treatment, the patient has consented to  Procedure(s): OPEN REDUCTION INTERNAL FIXATION (ORIF) DISTAL FEMUR FRACTURE (Right) as a surgical intervention.  The patient's history has been reviewed, patient examined, no change in status, stable for surgery.  I have reviewed the patient's chart and labs.  Questions were answered to the patient's satisfaction.     Lennette Bihari P Evalyne Cortopassi

## 2021-12-14 NOTE — Consult Note (Signed)
Reason for Consult:Right femoral condyle fx Referring Physician: Estill Cotta Time called: 0801 Time at bedside: [place]   Beth Carroll is an 51 y.o. female.  HPI: Beth Carroll fell trying to get into a truck on 11/11. She had immediate right knee pain and couldn't bear weight. She tried to tough it out at home but it wasn't getting better and she was brought to [facility]. Workup showed a femoral condyle fx. Orthopedic surgery was consulted and she was transferred to [facility] for definitive care. She was transferred to [facility] 11/15 and that night required transfer to ICU for septic shock. She is now improved and stable for surgery. She c/o right knee pain. She was using a RW prior to this fall following vascular surgery earlier this year.  Past Medical History:  Diagnosis Date   Coronary artery disease    Diabetes mellitus without complication ([place])    Diabetes mellitus, type 2 (HCC)    HFrEF (heart failure with reduced ejection fraction) ([place])    Hyperlipemia    Hypertension    Ischemic cardiomyopathy    Orthostatic hypotension    PAD (peripheral artery disease) ([place])    Tobacco use     Past Surgical History:  Procedure Laterality Date   CORONARY STENT INTERVENTION N/A 12/15/2020   Procedure: CORONARY STENT INTERVENTION;  Surgeon: Wellington Hampshire, MD;  Location: [place] [facility];  Service: Cardiovascular;  Laterality: N/A;   LEFT HEART CATH AND CORONARY ANGIOGRAPHY N/A 12/15/2020   Procedure: LEFT HEART CATH AND CORONARY ANGIOGRAPHY;  Surgeon: Wellington Hampshire, MD;  Location: [place] [facility];  Service: Cardiovascular;  Laterality: N/A;   LOWER EXTREMITY ANGIOGRAPHY Left 12/10/2020   Procedure: Lower Extremity Angiography;  Surgeon: Algernon Huxley, MD;  Location: [place] [facility];  Service: Cardiovascular;  Laterality: Left;   LOWER EXTREMITY ANGIOGRAPHY Right 12/25/2020   Procedure: LOWER EXTREMITY ANGIOGRAPHY;  Surgeon: Algernon Huxley, MD;  Location: [place] [facility];  Service:  Cardiovascular;  Laterality: Right;   LOWER EXTREMITY ANGIOGRAPHY Right 08/19/2021   Procedure: Lower Extremity Angiography;  Surgeon: Algernon Huxley, MD;  Location: [place] [facility];  Service: Cardiovascular;  Laterality: Right;   LOWER EXTREMITY ANGIOGRAPHY Right 08/19/2021   Procedure: Lower Extremity Angiography;  Surgeon: Algernon Huxley, MD;  Location: [place] [facility];  Service: Cardiovascular;  Laterality: Right;    Family History  Problem Relation Age of Onset   Hypertension Father     Social History:  reports that she quit smoking about a year ago. Her smoking use included cigarettes. She has never used smokeless tobacco. She reports that she does not drink alcohol. No history on file for drug use.  Allergies: No Known Allergies  Medications: I have reviewed the patient's current medications.  Results for orders placed or performed during the hospital encounter of 12/09/21 (from the past 48 hour(s))  Glucose, capillary     Status: Abnormal   Collection Time: 12/12/21 11:30 AM  Result Value Ref Range   Glucose-Capillary 179 (H) 70 - 99 mg/dL    Comment: Glucose reference range applies only to samples taken after fasting for at least 8 hours.   Comment 1 Notify RN   Heparin level (unfractionated)     Status: None   Collection Time: 12/12/21  2:49 PM  Result Value Ref Range   Heparin Unfractionated 0.32 0.30 - 0.70 IU/mL    Comment: (NOTE) The clinical reportable range upper limit is being lowered to >1.10 to align with  the FDA approved guidance for the current laboratory assay.  If heparin results are below expected values, and patient dosage has  been confirmed, suggest follow up testing of antithrombin III levels. Performed at [facility], [place] [address], [place], [place] [address]   Glucose, capillary     Status: Abnormal   Collection Time: 12/12/21  3:17 PM  Result Value Ref Range   Glucose-Capillary 135 (H) 70 - 99 mg/dL    Comment: Glucose reference  range applies only to samples taken after fasting for at least 8 hours.   Comment 1 Notify RN   Magnesium     Status: None   Collection Time: 12/12/21  6:24 PM  Result Value Ref Range   Magnesium 2.1 1.7 - 2.4 mg/dL    Comment: Performed at [facility], [place] [address] [place], [place] [address]  Phosphorus     Status: Abnormal   Collection Time: 12/12/21  6:24 PM  Result Value Ref Range   Phosphorus 2.4 (L) 2.5 - 4.6 mg/dL    Comment: Performed at [place] [address] [place], [place] [address]  Glucose, capillary     Status: Abnormal   Collection Time: 12/12/21  7:34 PM  Result Value Ref Range   Glucose-Capillary 130 (H) 70 - 99 mg/dL    Comment: Glucose reference range applies only to samples taken after fasting for at least 8 hours.  Heparin level (unfractionated)     Status: None   Collection Time: 12/12/21  8:38 PM  Result Value Ref Range   Heparin Unfractionated 0.35 0.30 - 0.70 IU/mL    Comment: (NOTE) The clinical reportable range upper limit is being lowered to >1.10 to align with the FDA approved guidance for the current laboratory assay.  If heparin results are below expected values, and patient dosage has  been confirmed, suggest follow up testing of antithrombin III levels. Performed at [facility], [place] [address], [place], [place] [address]   Glucose, capillary     Status: Abnormal   Collection Time: 12/12/21 11:22 PM  Result Value Ref Range   Glucose-Capillary 165 (H) 70 - 99 mg/dL    Comment: Glucose reference range applies only to samples taken after fasting for at least 8 hours.  Glucose, capillary     Status: Abnormal   Collection Time: 12/13/21  3:20 AM  Result Value Ref Range   Glucose-Capillary 184 (H) 70 - 99 mg/dL    Comment: Glucose reference range applies only to samples taken after fasting for at least 8 hours.  Heparin level (unfractionated)     Status: Abnormal   Collection Time: 12/13/21  5:24 AM  Result Value Ref  Range   Heparin Unfractionated 0.25 (L) 0.30 - 0.70 IU/mL    Comment: (NOTE) The clinical reportable range upper limit is being lowered to >1.10 to align with the FDA approved guidance for the current laboratory assay.  If heparin results are below expected values, and patient dosage has  been confirmed, suggest follow up testing of antithrombin III levels. Performed at [facility], [place] [address], [place], [place] [address]   Magnesium     Status: None   Collection Time: 12/13/21  5:24 AM  Result Value Ref Range   Magnesium 2.0 1.7 - 2.4 mg/dL    Comment: Performed at [facility] [address], [place],  [address]  Phosphorus     Status: None   Collection Time:  12/13/21  5:24 AM  Result Value Ref Range   Phosphorus 3.4 2.5 - 4.6 mg/dL    Comment: Performed at [facility], [place] [address] [place], [place] [address]  CBC     Status: Abnormal   Collection Time: 12/13/21  5:24 AM  Result Value Ref Range   WBC 3.7 (L) 4.0 - 10.5 K/uL   RBC 3.32 (L) 3.87 - 5.11 MIL/uL   Hemoglobin 9.4 (L) 12.0 - 15.0 g/dL   HCT 28.3 (L) 36.0 - 46.0 %   MCV 85.2 80.0 - 100.0 fL   MCH 28.3 26.0 - 34.0 pg   MCHC 33.2 30.0 - 36.0 g/dL   RDW 15.8 (H) 11.5 - 15.5 %   Platelets 168 150 - 400 K/uL   nRBC 0.0 0.0 - 0.2 %    Comment: Performed at [facility], [place] [address] [place], [place] [address]  Basic metabolic panel     Status: Abnormal   Collection Time: 12/13/21  5:24 AM  Result Value Ref Range   Sodium 144 135 - 145 mmol/L   Potassium 3.6 3.5 - 5.1 mmol/L   Chloride 107 98 - 111 mmol/L   CO2 29 22 - 32 mmol/L   Glucose, Bld 184 (H) 70 - 99 mg/dL    Comment: Glucose reference range applies only to samples taken after fasting for at least 8 hours.   BUN 16 6 - 20 mg/dL   Creatinine, Ser 0.36 (L) 0.44 - 1.00 mg/dL   Calcium 8.7 (L) 8.9 - 10.3 mg/dL   GFR, Estimated >60 >60 mL/min    Comment: (NOTE) Calculated using the CKD-EPI Creatinine Equation  (2021)    Anion gap 8 5 - 15    Comment: Performed at [facility] [address] [place], [place] [address]  Glucose, capillary     Status: Abnormal   Collection Time: 12/13/21  7:27 AM  Result Value Ref Range   Glucose-Capillary 168 (H) 70 - 99 mg/dL    Comment: Glucose reference range applies only to samples taken after fasting for at least 8 hours.  Glucose, capillary     Status: Abnormal   Collection Time: 12/13/21 11:29 AM  Result Value Ref Range   Glucose-Capillary 259 (H) 70 - 99 mg/dL    Comment: Glucose reference range applies only to samples taken after fasting for at least 8 hours.  Magnesium     Status: None   Collection Time: 12/13/21  2:37 PM  Result Value Ref Range   Magnesium 1.9 1.7 - 2.4 mg/dL    Comment: Performed at [facility], [place] [address] [place], [place] [address]  Phosphorus     Status: None   Collection Time: 12/13/21  2:37 PM  Result Value Ref Range   Phosphorus 3.3 2.5 - 4.6 mg/dL    Comment: Performed at [facility], [place] [address] [place], [place] [address]  Heparin level (unfractionated)     Status: None   Collection Time: 12/13/21  2:37 PM  Result Value Ref Range   Heparin Unfractionated 0.37 0.30 - 0.70 IU/mL    Comment: (NOTE) The clinical reportable range upper limit is being lowered to >1.10 to align with the FDA approved guidance for the current laboratory assay.  If heparin results are below expected values, and patient dosage has  been confirmed, suggest follow up testing of antithrombin III levels. Performed at [facility], [place] [address], [place], [place]  [address]   Glucose, capillary     Status: Abnormal   Collection Time: 12/13/21  4:43 PM  Result Value Ref Range   Glucose-Capillary 263 (H) 70 - 99 mg/dL    Comment: Glucose reference range applies only to samples taken after fasting for at least 8 hours.  Glucose, capillary     Status: Abnormal   Collection Time: 12/13/21  7:30 PM  Result Value  Ref Range   Glucose-Capillary 177 (H) 70 - 99 mg/dL    Comment: Glucose reference range applies only to samples taken after fasting for at least 8 hours.  Heparin level (unfractionated)     Status: None   Collection Time: 12/14/21  5:10 AM  Result Value Ref Range   Heparin Unfractionated 0.35 0.30 - 0.70 IU/mL    Comment: (NOTE) The clinical reportable range upper limit is being lowered to >1.10 to align with the FDA approved guidance for the current laboratory assay.  If heparin results are below expected values, and patient dosage has  been confirmed, suggest follow up testing of antithrombin III levels. Performed at [facility], [place] [address] [place], [place] [address]   Magnesium     Status: None   Collection Time: 12/14/21  5:10 AM  Result Value Ref Range   Magnesium 1.9 1.7 - 2.4 mg/dL    Comment: Performed at [facility] [address] [place] [address], [place], [place] [address]  Phosphorus     Status: None   Collection Time: 12/14/21  5:10 AM  Result Value Ref Range   Phosphorus 3.2 2.5 - 4.6 mg/dL    Comment: Performed at [facility] [address] [place] [address], [place], [place] [address]  Comprehensive metabolic panel     Status: Abnormal   Collection Time: 12/14/21  5:10 AM  Result Value Ref Range   Sodium 140 135 - 145 mmol/L   Potassium 3.6 3.5 - 5.1 mmol/L   Chloride 101 98 - 111 mmol/L   CO2 31 22 - 32 mmol/L   Glucose, Bld 174 (H) 70 - 99 mg/dL    Comment: Glucose reference range applies only to samples taken after fasting for at least 8 hours.   BUN 16 6 - 20 mg/dL   Creatinine, Ser 0.47 0.44 - 1.00 mg/dL   Calcium 8.8 (L) 8.9 - 10.3 mg/dL   Total Protein 5.2 (L) 6.5 - 8.1 g/dL   Albumin 2.2 (L) 3.5 - 5.0 g/dL   AST 14 (L) 15 - 41 U/L   ALT 10 0 - 44 U/L   Alkaline Phosphatase 48 38 - 126 U/L   Total Bilirubin 0.2 (L) 0.3 - 1.2 mg/dL   GFR, Estimated >60 >60 mL/min    Comment: (NOTE) Calculated using the CKD-EPI Creatinine Equation (2021)    Anion gap 8 5  - 15    Comment: Performed at [facility], [place] [address] [place], [place] [address]  CBC     Status: Abnormal   Collection Time: 12/14/21  5:10 AM  Result Value Ref Range   WBC 3.9 (L) 4.0 - 10.5 K/uL   RBC 3.22 (L) 3.87 - 5.11 MIL/uL   Hemoglobin 8.9 (L) 12.0 - 15.0 g/dL   HCT 28.3 (L) 36.0 - 46.0 %   MCV 87.9 80.0 - 100.0 fL   MCH 27.6 26.0 - 34.0 pg   MCHC 31.4 30.0 - 36.0 g/dL   RDW 15.5 11.5 - 15.5 %   Platelets 169 150 - 400 K/uL  nRBC 0.0 0.0 - 0.2 %    Comment: Performed at [facility], [place] [address] [place], [place] [address]  Glucose, capillary     Status: Abnormal   Collection Time: 12/14/21  7:35 AM  Result Value Ref Range   Glucose-Capillary 136 (H) 70 - 99 mg/dL    Comment: Glucose reference range applies only to samples taken after fasting for at least 8 hours.    No results found.  Review of Systems  HENT:  Negative for ear discharge, ear pain, hearing loss and tinnitus.   Eyes:  Negative for photophobia and pain.  Respiratory:  Negative for cough and shortness of breath.   Cardiovascular:  Negative for chest pain.  Gastrointestinal:  Negative for abdominal pain, nausea and vomiting.  Genitourinary:  Negative for dysuria, flank pain, frequency and urgency.  Musculoskeletal:  Positive for arthralgias (Right knee). Negative for back pain, myalgias and neck pain.  Neurological:  Negative for dizziness and headaches.  Hematological:  Does not bruise/bleed easily.  Psychiatric/Behavioral:  The patient is not nervous/anxious.    Blood pressure 124/66, pulse 75, temperature 98.6 F (37 C), temperature source Oral, resp. rate (!) 9, height '5\' 6"'$  (1.676 m), weight 52.9 kg, SpO2 98 %. Physical Exam Constitutional:      General: She is not in acute distress.    Appearance: She is well-developed. She is not diaphoretic.  HENT:     Head: Normocephalic and atraumatic.  Eyes:     General: No scleral icterus.       Right eye: No discharge.         Left eye: No discharge.     Conjunctiva/sclera: Conjunctivae normal.  Cardiovascular:     Rate and Rhythm: Normal rate and regular rhythm.  Pulmonary:     Effort: Pulmonary effort is normal. No respiratory distress.  Musculoskeletal:     Cervical back: Normal range of motion.     Comments: RLE No traumatic wounds, ecchymosis, or rash  ACE in place  No ankle effusion  Sens DPN, SPN, TN intact  Motor EHL, ext, flex, evers grossly intact  DP 1+, PT 0, 3+ pitting edema  Skin:    General: Skin is warm and dry.  Neurological:     Mental Status: She is alert.  Psychiatric:        Mood and Affect: Mood normal.        Behavior: Behavior normal.    Assessment/Plan: Right femoral condyle fx -- Plan ORIF today with Dr. Doreatha Martin. Multiple medical problems including poorly controlled DM type 2, chronic systolic CHF, PAD status post peripheral stenting, failure to thrive, severe cachexia, smoker quit 1 year ago and COPD not otherwise specified but not on oxygen, HTN, and HLP -- per primary service. They have cleared for surgery when possible.    Lisette Abu, PA-C Orthopedic Surgery [phone] 12/14/2021, 9:07 AM

## 2021-12-14 NOTE — Progress Notes (Addendum)
Nutrition Follow-up  DOCUMENTATION CODES:  Severe malnutrition in context of chronic illness  INTERVENTION:  Advance diet to carb modified after surgery Ensure Plus High Protein po TID, each supplement provides 350 kcal and 20 grams of protein once diet advanced. Monitor for remainder of micronutrient lab results vitamin E, copper, zinc, vitamin B6, vitamin A, and vitamin C labs  MVI with minerals daily -1 packet Juven BID, each packet provides 95 calories, 2.5 grams of protein (collagen), and 9.8 grams of carbohydrate (3 grams sugar); also contains 7 grams of L-arginine and L-glutamine, 300 mg vitamin C, 15 mg vitamin E, 1.2 mcg vitamin B-12, 9.5 mg zinc, 200 mg calcium, and 1.5 g  Calcium Beta-hydroxy-Beta-methylbutyrate to support wound healing  NUTRITION DIAGNOSIS:  Severe Malnutrition related to chronic illness (CHF, COPD) as evidenced by severe fat depletion, severe muscle depletion. - remains applicable  GOAL:  Patient will meet greater than or equal to 90% of their needs - progressing, diet advanced prior to NPO for surgery  MONITOR:  Diet advancement, Labs, Weight trends, TF tolerance, Skin  REASON FOR ASSESSMENT:  Consult Enteral/tube feeding initiation and management  ASSESSMENT:  51 year old female who [facility] to [facility] ED on 11/14 after a fall. PMH of T2DM, CAD, PAD, CHF, COPD, HTN, HLD, chronic R heel ischemic ulceration. Pt admitted with R complex comminuted intra-articular fracture of the R lateral femoral condyle and large knee lipohemarthrosis as well as septic shock secondary to UTI and acute metabolic encephalopathy.  11/17 - cortrak tube placed 11/19 - SLP advanced diet, cortrak tube removed  Pt resting in bed at the time of assessment. Discussed in rounds, cortrak tube removed over the weekend per [facility] MD after SLP advanced diet. Noted pt with severe malnutrition dx. Currently NPO with plans to go to OR for repair of fractures. Will monitor for diet  advancement to add nutrition supplements and follow-up on status of micronutrient labs.    Average Meal Intake: 11/19: 63% intake x 2 recorded meals  Nutritionally Relevant Medications: Scheduled Meds:  folic acid  1 mg Oral Daily   furosemide  40 mg Intravenous Daily   insulin aspart  0-5 Units Subcutaneous QHS   insulin aspart  0-9 Units Subcutaneous TID WC   insulin aspart  3 Units Subcutaneous TID WC   insulin detemir  12 Units Subcutaneous BID   multivitamin with minerals  1 tablet Oral Daily   pantoprazole  40 mg Oral Daily   potassium chloride  40 mEq Oral Once   thiamine  100 mg Oral Daily   PRN Meds: polyethylene glycol  Labs Reviewed  Vitamin/Mineral Profile: Vitamin B12: 1183 (high) Vitamin B6: pending Vitamin A: pending Vitamin D: 83.92 Vitamin E: pending Vitamin C: pending Copper: pending Zinc: pending CRP: 1.6   NUTRITION - FOCUSED PHYSICAL EXAM: Flowsheet Row Most Recent Value  Orbital Region Severe depletion  Upper Arm Region Moderate depletion  Thoracic and Lumbar Region Severe depletion  Buccal Region Severe depletion  Temple Region Severe depletion  Clavicle Bone Region Severe depletion  Clavicle and Acromion Bone Region Severe depletion  Scapular Bone Region Severe depletion  Dorsal Hand Moderate depletion  Patellar Region Severe depletion  Anterior Thigh Region Severe depletion  Posterior Calf Region Severe depletion  Edema (RD Assessment) Mild  [BLE]  Hair Reviewed  Eyes Reviewed  Mouth Reviewed  Skin Reviewed  Nails Reviewed    Diet Order:   Diet Order             Diet  NPO time specified Except for: Sips with Meds  Diet effective now                   EDUCATION NEEDS:   Not appropriate for education at this time  Skin:  Skin Assessment: Skin Integrity Issues: Skin Integrity Issues:: Stage II, Stage III, DTI, Other (Comment) DTI: R buttock Stage II: L heel, sacrum Stage III: R heel Other: MASD bilateral  buttocks  Last BM:  11/19  Height:  Ht Readings from Last 1 Encounters:  12/10/21 '5\' 6"'$  (1.676 m)    Weight:  Wt Readings from Last 1 Encounters:  12/14/21 52.9 kg    Ideal Body Weight:  59.1 kg  BMI:  Body mass index is 18.82 kg/m.  Estimated Nutritional Needs:  Kcal:  1700-1900 Protein:  85-100 grams Fluid:  1.7-1.9 L    Ranell Patrick, RD, LDN Clinical Dietitian RD pager # available in [facility]  After hours/weekend pager # available in [facility]

## 2021-12-14 NOTE — Transfer of Care (Signed)
Immediate Anesthesia Transfer of Care Note  Patient: Beth Carroll  Procedure(s) Performed: OPEN REDUCTION INTERNAL FIXATION RIGHT DISTAL FEMUR (Right: Leg Lower)  Patient Location: PACU  Anesthesia Type:General  Level of Consciousness: awake, alert , and oriented  Airway & Oxygen Therapy: Patient Spontanous Breathing  Post-op Assessment: Report given to RN, Post -op Vital signs reviewed and stable, and Patient moving all extremities X 4  Post vital signs: Reviewed and stable  Last Vitals:  Vitals Value Taken Time  BP 133/90 12/14/21 1804  Temp    Pulse 79 12/14/21 1812  Resp 14 12/14/21 1812  SpO2 98 % 12/14/21 1812  Vitals shown include unvalidated device data.  Last Pain:  Vitals:   12/14/21 1439  TempSrc: Oral  PainSc: 0-No pain      Patients Stated Pain Goal: 3 (43/15/40 0867)  Complications: No notable events documented.

## 2021-12-14 NOTE — Anesthesia Preprocedure Evaluation (Addendum)
Anesthesia Evaluation  Patient identified by MRN, date of birth, ID band Patient awake    Reviewed: Allergy & Precautions, NPO status , Patient's Chart, lab work & pertinent test results  Airway Mallampati: II  TM Distance: >3 FB Neck ROM: Full    Dental  (+) Edentulous Upper, Edentulous Lower   Pulmonary former smoker   Pulmonary exam normal        Cardiovascular hypertension, Pt. on medications and Pt. on home beta blockers + CAD, + Peripheral Vascular Disease and +CHF   Rhythm:Regular Rate:Normal  ECHO 06/23:  1. Left ventricular ejection fraction, by estimation, is 40 to 45%. The  left ventricle has mild to moderately decreased function. The left  ventricle demonstrates regional wall motion abnormalities (see scoring  diagram/findings for description). There is   mild left ventricular hypertrophy. Left ventricular diastolic parameters  are consistent with Grade II diastolic dysfunction (pseudonormalization).  There is akinesis of the left ventricular, entire apical segment.   2. Right ventricular systolic function is normal. The right ventricular  size is normal.   3. The mitral valve is normal in structure. No evidence of mitral valve  regurgitation.   4. The aortic valve was not well visualized. Aortic valve regurgitation  is not visualized.   5. The inferior vena cava is normal in size with <50% respiratory  variability, suggesting right atrial pressure of 8 mmHg.      Neuro/Psych negative neurological ROS  negative psych ROS   GI/Hepatic Neg liver ROS,GERD  Medicated,,  Endo/Other  diabetes, Type 2, Insulin Dependent  Lab Results      Component                Value               Date                      HGBA1C                   8.1 (H)             12/10/2021             Renal/GU   negative genitourinary   Musculoskeletal Right distal femur fx   Abdominal Normal abdominal exam  (+)   Peds   Hematology negative hematology ROS (+) Lab Results      Component                Value               Date                      WBC                      3.9 (L)             12/14/2021                HGB                      8.9 (L)             12/14/2021                HCT                      28.3 (L)  12/14/2021                MCV                      87.9                12/14/2021                PLT                      169                 12/14/2021             Lab Results      Component                Value               Date                      NA                       140                 12/14/2021                K                        3.6                 12/14/2021                CO2                      31                  12/14/2021                GLUCOSE                  174 (H)             12/14/2021                BUN                      16                  12/14/2021                CREATININE               0.47                12/14/2021                CALCIUM                  8.8 (L)             12/14/2021                EGFR                     107                 03/18/2021  GFRNONAA                 >60                 12/14/2021              Anesthesia Other Findings   Reproductive/Obstetrics                             Anesthesia Physical Anesthesia Plan  ASA: 3  Anesthesia Plan: General   Post-op Pain Management:    Induction: Intravenous  PONV Risk Score and Plan: 3 and Ondansetron, Dexamethasone, Treatment may vary due to age or medical condition and Midazolam  Airway Management Planned: Mask and Oral ETT  Additional Equipment: None  Intra-op Plan:   Post-operative Plan: Extubation in OR  Informed Consent: I have reviewed the patients History and Physical, chart, labs and discussed the procedure including the risks, benefits and alternatives for the proposed anesthesia with the patient or authorized  representative who has indicated his/her understanding and acceptance.     Dental advisory given  Plan Discussed with: CRNA  Anesthesia Plan Comments:        Anesthesia Quick Evaluation

## 2021-12-14 NOTE — Anesthesia Procedure Notes (Addendum)
Procedure Name: Intubation Date/Time: 12/14/2021 4:25 PM  Performed by: Cathren Harsh, CRNAPre-anesthesia Checklist: Patient identified, Emergency Drugs available, Suction available and Patient being monitored Patient Re-evaluated:Patient Re-evaluated prior to induction Oxygen Delivery Method: Circle System Utilized Preoxygenation: Pre-oxygenation with 100% oxygen Induction Type: IV induction and Rapid sequence Laryngoscope Size: Mac and 3 Grade View: Grade II Tube type: Oral Tube size: 7.0 mm Number of attempts: 1 Airway Equipment and Method: Stylet and Oral airway Placement Confirmation: ETT inserted through vocal cords under direct vision, positive ETCO2 and breath sounds checked- equal and bilateral Secured at: 21 cm Tube secured with: Tape Dental Injury: Teeth and Oropharynx as per pre-operative assessment

## 2021-12-15 ENCOUNTER — Encounter (HOSPITAL_COMMUNITY): Payer: Self-pay | Admitting: Student

## 2021-12-15 LAB — BASIC METABOLIC PANEL
Anion gap: 17 — ABNORMAL HIGH (ref 5–15)
BUN: 15 mg/dL (ref 6–20)
CO2: 24 mmol/L (ref 22–32)
Calcium: 8.8 mg/dL — ABNORMAL LOW (ref 8.9–10.3)
Chloride: 94 mmol/L — ABNORMAL LOW (ref 98–111)
Creatinine, Ser: 0.85 mg/dL (ref 0.44–1.00)
GFR, Estimated: >60 mL/min (ref >60)
Glucose, Bld: 478 mg/dL — ABNORMAL HIGH (ref 70–99)
Potassium: 3.8 mmol/L (ref 3.5–5.1)
Sodium: 135 mmol/L (ref 135–145)

## 2021-12-15 LAB — GLUCOSE, CAPILLARY
Glucose-Capillary: 362 mg/dL — ABNORMAL HIGH (ref 70–99)
Glucose-Capillary: 362 mg/dL — ABNORMAL HIGH (ref 70–99)
Glucose-Capillary: 355 mg/dL — ABNORMAL HIGH (ref 70–99)
Glucose-Capillary: 90 mg/dL (ref 70–99)
Glucose-Capillary: 89 mg/dL (ref 70–99)

## 2021-12-15 LAB — CULTURE, BLOOD (ROUTINE X 2)
Culture: NO GROWTH
Culture: NO GROWTH

## 2021-12-15 LAB — CBC
HCT: 31.5 % — ABNORMAL LOW (ref 36.0–46.0)
Hemoglobin: 9.9 g/dL — ABNORMAL LOW (ref 12.0–15.0)
MCH: 27.7 pg (ref 26.0–34.0)
MCHC: 31.4 g/dL (ref 30.0–36.0)
MCV: 88.2 fL (ref 80.0–100.0)
Platelets: 222 10*3/uL (ref 150–400)
RBC: 3.57 MIL/uL — ABNORMAL LOW (ref 3.87–5.11)
RDW: 15.2 % (ref 11.5–15.5)
WBC: 7.8 10*3/uL (ref 4.0–10.5)
nRBC: 0 % (ref 0.0–0.2)

## 2021-12-15 LAB — HEPARIN LEVEL (UNFRACTIONATED): Heparin Unfractionated: <0.1 IU/mL — ABNORMAL LOW (ref 0.30–0.70)

## 2021-12-15 MED ORDER — PROPOFOL 1000 MG/100ML IV EMUL
INTRAVENOUS | Status: AC
  Filled 2021-12-15: qty 100

## 2021-12-15 MED ORDER — FUROSEMIDE 20 MG PO TABS
20.0000 mg | ORAL_TABLET | Freq: Every day | ORAL | Status: DC
  Administered 2021-12-15 – 2021-12-16 (×2): 20 mg via ORAL
  Filled 2021-12-15 (×2): qty 1

## 2021-12-15 MED ORDER — INSULIN ASPART 100 UNIT/ML IJ SOLN
4.0000 [IU] | Freq: Three times a day (TID) | INTRAMUSCULAR | Status: DC
  Administered 2021-12-15 – 2021-12-18 (×11): 4 [IU] via SUBCUTANEOUS

## 2021-12-15 MED ORDER — INSULIN DETEMIR 100 UNIT/ML ~~LOC~~ SOLN
13.0000 [IU] | Freq: Two times a day (BID) | SUBCUTANEOUS | Status: DC
  Administered 2021-12-15 – 2021-12-17 (×4): 13 [IU] via SUBCUTANEOUS
  Filled 2021-12-15 (×6): qty 0.13

## 2021-12-15 MED ORDER — PHENYLEPHRINE HCL-NACL 20-0.9 MG/250ML-% IV SOLN
INTRAVENOUS | Status: AC
  Filled 2021-12-15: qty 500

## 2021-12-15 MED ORDER — INSULIN ASPART 100 UNIT/ML IJ SOLN
0.0000 [IU] | Freq: Three times a day (TID) | INTRAMUSCULAR | Status: DC
  Administered 2021-12-15 (×2): 15 [IU] via SUBCUTANEOUS
  Administered 2021-12-16 (×2): 2 [IU] via SUBCUTANEOUS
  Administered 2021-12-16 – 2021-12-17 (×2): 5 [IU] via SUBCUTANEOUS
  Administered 2021-12-17 – 2021-12-18 (×2): 8 [IU] via SUBCUTANEOUS
  Administered 2021-12-18: 5 [IU] via SUBCUTANEOUS

## 2021-12-15 MED ORDER — APIXABAN 5 MG PO TABS
5.0000 mg | ORAL_TABLET | Freq: Two times a day (BID) | ORAL | Status: DC
  Administered 2021-12-15 – 2021-12-18 (×7): 5 mg via ORAL
  Filled 2021-12-15 (×7): qty 1

## 2021-12-15 NOTE — TOC CM/SW Note (Signed)
Pt active with [facility]. Will need HH RN orders for wound care and HHPT with [facility].  Attending updated. [place], Heart Failure TOC CM [phone]

## 2021-12-15 NOTE — Plan of Care (Signed)

## 2021-12-15 NOTE — Progress Notes (Signed)
ANTICOAGULATION CONSULT NOTE  Pharmacy Consult:  Eliquis Indication:  PAD  No Known Allergies  Patient Measurements: Height: '5\' 6"'$  (167.6 cm) Weight: 52.9 kg (116 lb 10 oz) IBW/kg (Calculated) : 59.3  Vital Signs: Temp: 98.3 F (36.8 C) (11/21 0733) Temp Source: Oral (11/21 0340) BP: 113/65 (11/21 0600) Pulse Rate: 82 (11/21 0600)  Labs: Recent Labs    12/13/21 0524 12/13/21 1437 12/14/21 0510 12/15/21 0204  HGB 9.4*  --  8.9* 9.9*  HCT 28.3*  --  28.3* 31.5*  PLT 168  --  169 222  HEPARINUNFRC 0.25* 0.37 0.35 <0.10*  CREATININE 0.36*  --  0.47 0.85     Estimated Creatinine Clearance: 64.7 mL/min (by C-G formula based on SCr of 0.85 mg/dL).   Assessment: 51 y/o F s/p fall, tx from [facility] to [facility], had hypotension requiring transfer to the ICU, holding apixaban and bridging with IV heparin for PAD.  Now s/p ORIF of R distal femur.  Hemoglobin > 8 g/dL on POD #1 without transfusion, appropriate to restart PTA Eliquis per fracture care post-op [facility] consult.  No bleeding reported.  Goal of Therapy:  Appropriate anticoagulation Monitor platelets by anticoagulation protocol: Yes   Plan:  Restart Eliquis '5mg'$  PO BID Pharmacy will sign off and follow peripherally.  Thank you for the consult!  Oris Calmes D. Mina Marble, PharmD, BCPS, [place] 12/15/2021, 8:15 AM

## 2021-12-15 NOTE — Evaluation (Signed)
Occupational Therapy Evaluation Patient Details Name: Beth Carroll MRN: [id number] DOB: 1970-11-21 Today's Date: 12/15/2021   History of Present Illness Pt is a 51 y/o female who presented after a fall (3 days prior to presentation to ED) resulting in R intra-articular fx of R lateral femoral condyle. Pt admitted to ICU for septic shock due to UTI. Pt underwent ORIF of R LE on 11/20. PMH: DM2, CAD, PAD, GERD, B heel pressure wounds.   Clinical Impression   PTA, pt lives with boyfriend and prior to July 2023 hospitalization, pt was completely independent in all daily tasks. In recent months, pt has been using a walker for mobility and able to manage most ADLs with intermittent light assistance. Pt presents now with deficits in RLE pain, strength, balance, and overall endurance. Pt requires Mod A x 2 for bed mobility, Max A x 2 for sit to stand trials with RW. Pt requires Setup for UB ADLs and Max A x 2 for LB ADLs. Based on current presentation and high fall risk, recommend SNF rehab at [place]. However, pt may decline SNF and pending family ability to provide increased assist, may progress to [facility].   BP supine: 87/59  BP sitting EOB: 87/63 BP post activity: 96/70     Recommendations for follow up therapy are one component of a multi-disciplinary discharge planning process, led by the attending physician.  Recommendations may be updated based on patient status, additional functional criteria and insurance authorization.   Follow Up Recommendations  Skilled nursing-short term rehab (<3 hours/day) (pt likely to decline and opt for home pending family ability to provide support)     Assistance Recommended at Discharge Frequent or constant Supervision/Assistance  Patient can return home with the following Two people to help with walking and/or transfers;A lot of help with bathing/dressing/bathroom    Functional Status Assessment  Patient has had a recent decline in their functional status and  demonstrates the ability to make significant improvements in function in a reasonable and predictable amount of time.  Equipment Recommendations  Other (comment) (TBD pending progress)    Recommendations for Other Services       Precautions / Restrictions Precautions Precautions: Fall;Other (comment) Precaution Comments: monitor BP Restrictions Weight Bearing Restrictions: Yes RLE Weight Bearing: Non weight bearing      Mobility Bed Mobility Overal bed mobility: Needs Assistance Bed Mobility: Supine to Sit, Sit to Supine     Supine to sit: Mod assist, +2 for physical assistance, +2 for safety/equipment, HOB elevated Sit to supine: Mod assist, +2 for physical assistance, +2 for safety/equipment   General bed mobility comments: pt requesting increased time throughout, educated on use of blanket around R foot to assist LE to EOB. Cues for hand placement, sequencing throughout. assist for BLE back into bed    Transfers Overall transfer level: Needs assistance Equipment used: Rolling walker (2 wheels) Transfers: Sit to/from Stand Sit to Stand: Max assist, +2 physical assistance, +2 safety/equipment           General transfer comment: Max A x 2 for two standing trials at bedside, cues for NWB, kicking RLE out and keeping L foot flat on the floor. Unable to achieve full upright posture, maintaining < 10 sec each      Balance Overall balance assessment: Needs assistance Sitting-balance support: No upper extremity supported, Feet supported Sitting balance-Leahy Scale: Fair     Standing balance support: Bilateral upper extremity supported, During functional activity, Reliant on assistive device for balance Standing balance-Leahy Scale:  Poor                             ADL either performed or assessed with clinical judgement   ADL Overall ADL's : Needs assistance/impaired Eating/Feeding: Independent   Grooming: Set up;Sitting   Upper Body Bathing: Set  up;Sitting   Lower Body Bathing: Maximal assistance;+2 for physical assistance;+2 for safety/equipment;Sit to/from stand;Sitting/lateral leans Lower Body Bathing Details (indicate cue type and reason): assist for peri care in standing at bedside w/ noted soiled bed pad Upper Body Dressing : Set up;Sitting   Lower Body Dressing: Maximal assistance;+2 for physical assistance;+2 for safety/equipment;Sit to/from stand;Sitting/lateral leans       Toileting- Clothing Manipulation and Hygiene: Total assistance;Bed level         General ADL Comments: Pt with significant weakness, limited by NWB RLE precautions and requires +2 assist to stand with RW     Vision Baseline Vision/History: 1 Wears glasses Ability to See in Adequate Light: 0 Adequate Patient Visual Report: No change from baseline Vision Assessment?: No apparent visual deficits     Perception     Praxis      Pertinent Vitals/Pain Pain Assessment Pain Assessment: Faces Faces Pain Scale: Hurts little more Pain Location: R LE Pain Descriptors / Indicators: Grimacing, Guarding Pain Intervention(s): Monitored during session, RN gave pain meds during session, Limited activity within patient's tolerance, Repositioned     Hand Dominance Right   Extremity/Trunk Assessment Upper Extremity Assessment Upper Extremity Assessment: Generalized weakness   Lower Extremity Assessment Lower Extremity Assessment: Defer to PT evaluation   Cervical / Trunk Assessment Cervical / Trunk Assessment: Normal   Communication Communication Communication: No difficulties   Cognition Arousal/Alertness: Awake/alert Behavior During Therapy: WFL for tasks assessed/performed, Anxious Overall Cognitive Status: No family/caregiver present to determine baseline cognitive functioning                                 General Comments: able to give accurate history, some decreased awareness of deficits and safety precautions. follows  directions consistently     General Comments  Daughter at bedside though sleeping in recliner during evaluation    Exercises     Shoulder Instructions      Home Living Family/patient expects to be discharged to:: Private residence Living Arrangements: Spouse/significant other Available Help at Discharge: Available PRN/intermittently;Family Type of Home: Mobile home Home Access: Stairs to enter Entrance Stairs-Number of Steps: 3 Entrance Stairs-Rails: Can reach both Home Layout: One level     Bathroom Shower/Tub: Teacher, early years/pre: Standard     Home Equipment: Rotan (2 wheels);BSC/3in1;Shower seat;Wheelchair - manual          Prior Functioning/Environment Prior Level of Function : Independent/Modified Independent;Working/employed;Driving             Mobility Comments: prior to July admission, completely independent with mobility. Since wounds on B heels, pt has been using walker and limiting mobility in the home per MD orders. assist to manage stairs in/out of home ADLs Comments: pt reports independent in ADL/IADL PTA; works in a [employer] prior to July 2023 admission. Has been sponge bathing since then d/t wounds, assist for IADLs        OT Problem List: Decreased strength;Decreased activity tolerance;Impaired balance (sitting and/or standing);Decreased knowledge of use of DME or AE;Decreased safety awareness;Pain      OT  Treatment/Interventions: Self-care/ADL training;Therapeutic exercise;Energy conservation;DME and/or AE instruction;Therapeutic activities;Patient/family education;Balance training    OT Goals(Current goals can be found in the care plan section) Acute Rehab OT Goals Patient Stated Goal: go home OT Goal Formulation: With patient Time For Goal Achievement: 12/29/21 Potential to Achieve Goals: Good  OT Frequency: Min 2X/week    Co-evaluation PT/OT/SLP Co-Evaluation/Treatment: Yes Reason for Co-Treatment: For  patient/therapist safety;To address functional/ADL transfers   OT goals addressed during session: ADL's and self-care;Strengthening/ROM      AM-PAC OT "6 Clicks" Daily Activity     Outcome Measure Help from another person eating meals?: None Help from another person taking care of personal grooming?: A Little Help from another person toileting, which includes using toliet, bedpan, or urinal?: Total Help from another person bathing (including washing, rinsing, drying)?: A Lot Help from another person to put on and taking off regular upper body clothing?: A Little Help from another person to put on and taking off regular lower body clothing?: A Lot 6 Click Score: 15   End of Session Equipment Utilized During Treatment: Gait belt;Rolling walker (2 wheels) Nurse Communication: Mobility status  Activity Tolerance: Patient tolerated treatment well Patient left: in bed;with call bell/phone within reach;with bed alarm set;with family/visitor present  OT Visit Diagnosis: Unsteadiness on feet (R26.81);Other abnormalities of gait and mobility (R26.89)                Time: 6010-9323 OT Time Calculation (min): 31 min Charges:  OT General Charges $OT Visit: 1 Visit OT Evaluation $OT Eval Moderate Complexity: 1 Mod  Malachy Chamber, OTR/L Acute Rehab Services Office: [phone]   Layla Maw 12/15/2021, 8:48 AM

## 2021-12-15 NOTE — Progress Notes (Signed)
[facility]                                                                              Beth Carroll, is a 51 y.o. female, DOB - 1970-07-13, TGG:[id number] Admit date - 12/09/2021    Outpatient Primary MD for the patient is Baxter Hire, MD  LOS - 6  days  No chief complaint on file.      Brief summary   Patient is a 51 year old female with a history of poorly controlled DM type 2, chronic systolic CHF, PAD status post peripheral stenting, tobacco use disorder, reportedly has lost over 100 pounds of weight according to the daughter's boyfriend with significant failure to thrive, severe cachexia smoker quit 1 year ago and COPD not otherwise specified but not on oxygen, HTN, HLP.  Patient has chronic right heel ischemic ulceration, status post thrombolysis and thrombectomy for lower lobe claudication in July 2023, benign meningioma occipital lobe. Patient was admitted to [facility] ER on 11/14 after a fall on 11/8 and right knee pain.  Patient had spent few days in recliner and upon EMS arrival was found to be saturated in urine.  In ED, was discovered to have right femoral condyle fracture along with UTI, stage 1-2 sacral wound.  Initially alert with normal vital signs, she was started on pain control and IV Rocephin for UTI.  Patient was then subsequently transferred to [facility] and was found to be hypothermic, hypotensive, lethargic and weak.  ABG with severe metabolic acidosis and CCM was consulted.  11/15 admitted to [facility] as transfer from [facility] for fracture repair 11/15 overnight transferred from [facility] med surg to ICU for hypotension, acidosis, lethargy.  Was placed on vasopressors, found to have DKA. Urine culture 11/14 >> E. Coli, pan-sens Blood cultures 11/16 >> ngtd   Assessment & Plan    Principal Problem:   DKA, type 2 ([place]) in the setting of diabetes mellitus type 2, uncontrolled with hyperglycemia Anion gap metabolic acidosis -Hemoglobin A1c 8.1 on  12/10/2021.  Patient was placed on aggressive IV fluids and IV insulin for DKA and volume resuscitation. -SLP evaluation completed, on 11/19, cortrack and tube feeds discontinued. -Started on carb modified diet -Will hold Farxiga and Ozempic at discharge and will need to follow-up with her endocrinologist, Dr. Gabriel Carina -CBGs elevated, had received Intra-op Decadron and did not receive a.m. Levemir yesterday -Increased Levemir to 13 units twice daily, add NovoLog meal coverage 4 units 3 times daily AC, moderate sliding scale insulin Recent Labs    12/14/21 1612 12/14/21 1807 12/14/21 2029 12/14/21 2311 12/15/21 0731 12/15/21 0950  GLUCAP 117* 144* 162* 355* 362* 362*     Active Problems:   Traumatic closed fracture of femoral condyle with minimal displacement, Right knee -Orthopedics consulted, underwent ORIF right lateral femoral condyle fracture, postop day #1 -Pain control, DVT prophylaxis per orthopedics, -Will start physical therapy, recommending SNF for rehab -Placed on eliquis   Acute kidney injury, hypokalemia -Resolved, potassium 3.8, creatinine 0.8   Severe hypophosphatemia -Likely due to severe protein calorie malnutrition, -Now improving, 3.2  Septic shock secondary to UTI -Vasopressors now off, urine culture showed E.  Coli -Complete 7-day course of IV Rocephin    Hypertension -BP now soft, holding Coreg  -Continue Lasix 20 mg daily    Bilateral lower extremity edema, acute on chronic combined systolic and diastolic CHF -Likely fluid overload due to aggressive fluid resuscitation, septic shock on admit, also has chronic bilateral lower extremity edema -Received Lasix 40 mg IV daily  x3 days, I's and O's with 7.5 L positive -2D echo 06/2021 had shown EF of 40 to 45% with G2 DD -resumed lasix '20mg'$  daily   PAD on anticoagulation -Eliquis resumed    Acute metabolic encephalopathy -Currently resolved, alert and oriented x3  COPD, tobacco use  disorder -Currently stable, as needed DuoNebs -Counseled on smoking cessation  Pressure injury documentation Sacrum stage II, POA Right buttocks deep tissue pressure injury, POA -Management per nursing and wound care   Severe protein calorie malnutrition Nutrition Problem: Severe Malnutrition Etiology: chronic illness (CHF, COPD) Signs/Symptoms: severe fat depletion, severe muscle depletion Interventions: Tube feeding, MVI, Other (Comment) (checking vitamin and mineral labs) Estimated body mass index is 18.82 kg/m as calculated from the following:   Height as of this encounter: '5\' 6"'$  (1.676 m).   Weight as of this encounter: 52.9 kg.  Code Status:  full code  DVT Prophylaxis:  SCDs Start: 12/14/21 2016On IV heparin drip apixaban (ELIQUIS) tablet 5 mg  Level of Care: Level of care: Telemetry Medical Family Communication: Updated patient.  Daughter present in the room  Disposition Plan:      Remains inpatient appropriate: Transfer to med telemetry floor, will need SNF.  TOC consulted   Procedures:  Consultants:   PCCM Orthopedics  Antimicrobials:   Anti-infectives (From admission, onward)    Start     Dose/Rate Route Frequency Ordered Stop   12/14/21 1604  vancomycin (VANCOCIN) powder  Status:  Discontinued          As needed 12/14/21 1604 12/14/21 1758   12/14/21 1500  ceFAZolin (ANCEF) IVPB 2g/100 mL premix        2 g 200 mL/hr over 30 Minutes Intravenous To Surgery 12/14/21 1154 12/14/21 1635   12/11/21 1400  cefTRIAXone (ROCEPHIN) 2 g in sodium chloride 0.9 % 100 mL IVPB        2 g 200 mL/hr over 30 Minutes Intravenous Every 24 hours 12/11/21 1312 12/16/21 2359   12/10/21 2200  vancomycin (VANCOREADY) IVPB 1250 mg/250 mL  Status:  Discontinued        1,250 mg 166.7 mL/hr over 90 Minutes Intravenous Every 24 hours 12/10/21 0233 12/10/21 0842   12/10/21 0315  piperacillin-tazobactam (ZOSYN) IVPB 3.375 g  Status:  Discontinued        3.375 g 12.5 mL/hr over 240  Minutes Intravenous Every 8 hours 12/10/21 0219 12/11/21 1312   12/10/21 0315  vancomycin (VANCOCIN) IVPB 1000 mg/200 mL premix        1,000 mg 200 mL/hr over 60 Minutes Intravenous  Once 12/10/21 0219 12/10/21 0355   12/09/21 2359  cefTRIAXone (ROCEPHIN) 2 g in sodium chloride 0.9 % 100 mL IVPB  Status:  Discontinued        2 g 200 mL/hr over 30 Minutes Intravenous Daily at bedtime 12/09/21 2312 12/10/21 0135          Medications  apixaban  5 mg Oral BID   budesonide (PULMICORT) nebulizer solution  0.25 mg Nebulization BID   Chlorhexidine Gluconate Cloth  6 each Topical Daily   docusate sodium  100 mg Oral BID   feeding  supplement  237 mL Oral TID BM   folic acid  1 mg Oral Daily   furosemide  20 mg Oral Daily   insulin aspart  0-15 Units Subcutaneous TID WC   insulin aspart  0-5 Units Subcutaneous QHS   insulin aspart  4 Units Subcutaneous TID WC   insulin detemir  13 Units Subcutaneous BID   leptospermum manuka honey  1 Application Topical Daily   multivitamin with minerals  1 tablet Oral Daily   nutrition supplement (JUVEN)  1 packet Oral BID BM   pantoprazole  40 mg Oral Daily   potassium chloride  40 mEq Oral Once   thiamine  100 mg Oral Daily      Subjective:   Beth Carroll was seen and examined today.  Feeling better, postop day #1.  No acute complaints.  Pain controlled no nausea vomiting, abdominal pain, chest pain.  Objective:   Vitals:   12/15/21 0825 12/15/21 0831 12/15/21 0832 12/15/21 0845  BP: 96/70 102/65  (!) 101/58  Pulse: 86   80  Resp: '16 11  12  '$ Temp:      TempSrc:      SpO2: 100%  100% 93%  Weight:      Height:        Intake/Output Summary (Last 24 hours) at 12/15/2021 1118 Last data filed at 12/15/2021 1011 Gross per 24 hour  Intake 2115.01 ml  Output 1600 ml  Net 515.01 ml     Wt Readings from Last 3 Encounters:  12/14/21 52.9 kg  09/18/21 53.5 kg  08/26/21 56.9 kg   Physical Exam General: Alert and oriented x 3,  NAD Cardiovascular: S1 S2 clear, RRR.  Respiratory: CTAB, no wheezing Gastrointestinal: Soft, nontender, nondistended, NBS Ext: 2+ pedal edema bilaterally Neuro: no new deficits Skin: Dressing intact, RLE Psych: Normal affect t   Data Reviewed:  I have personally reviewed following labs    CBC Lab Results  Component Value Date   WBC 7.8 12/15/2021   RBC 3.57 (L) 12/15/2021   HGB 9.9 (L) 12/15/2021   HCT 31.5 (L) 12/15/2021   MCV 88.2 12/15/2021   MCH 27.7 12/15/2021   PLT 222 12/15/2021   MCHC 31.4 12/15/2021   RDW 15.2 12/15/2021   LYMPHSABS 0.6 (L) 12/10/2021   MONOABS 0.4 12/10/2021   EOSABS 0.0 12/10/2021   BASOSABS 0.0 10/62/6948     Last metabolic panel Lab Results  Component Value Date   NA 135 12/15/2021   K 3.8 12/15/2021   CL 94 (L) 12/15/2021   CO2 24 12/15/2021   BUN 15 12/15/2021   CREATININE 0.85 12/15/2021   GLUCOSE 478 (H) 12/15/2021   GFRNONAA >60 12/15/2021   CALCIUM 8.8 (L) 12/15/2021   PHOS 3.2 12/14/2021   PROT 5.2 (L) 12/14/2021   ALBUMIN 2.2 (L) 12/14/2021   BILITOT 0.2 (L) 12/14/2021   ALKPHOS 48 12/14/2021   AST 14 (L) 12/14/2021   ALT 10 12/14/2021   ANIONGAP 17 (H) 12/15/2021    CBG (last 3)  Recent Labs    12/14/21 2311 12/15/21 0731 12/15/21 0950  GLUCAP 355* 362* 362*      Coagulation Profile: No results for input(s): "INR", "PROTIME" in the last 168 hours.   Radiology Studies: I have personally reviewed the imaging studies  DG Knee 1-2 Views Right  Result Date: 12/14/2021 CLINICAL DATA:  Open reduction internal fixation of right distal femoral fracture. EXAM: RIGHT KNEE - 1-2 VIEW COMPARISON:  12/08/2021. FINDINGS: Eight fluoroscopic images were  obtained intraoperatively. Total fluoroscopy time is 55 seconds. Dose: 2.15 mGy. There has been interval placement of fixation hardware in the distal right femur 4 lateral condyle fracture. Please see operative report for additional information. IMPRESSION: Intraoperative  utilization of fluoroscopy. Electronically Signed   By: Brett Fairy M.D.   On: 12/14/2021 20:22   DG C-Arm 1-60 Min-No Report  Result Date: 12/14/2021 Fluoroscopy was utilized by the requesting physician.  No radiographic interpretation.   DG C-Arm 1-60 Min-No Report  Result Date: 12/14/2021 Fluoroscopy was utilized by the requesting physician.  No radiographic interpretation.   DG Knee Right Port  Result Date: 12/14/2021 CLINICAL DATA:  Fracture, postop. EXAM: PORTABLE RIGHT KNEE - 1-2 VIEW COMPARISON:  Preoperative imaging. FINDINGS: Lateral plate and multi screw fixation of distal femur fracture. Improved fracture alignment from preoperative exam. Recent postsurgical change includes air and edema in the joint space and soft tissues. Extensive vascular stent. The bones are under mineralized IMPRESSION: ORIF of distal femur fracture with improved fracture alignment from preoperative exam. Electronically Signed   By: Keith Rake M.D.   On: 12/14/2021 19:40       Beth Carroll M.D. [facility] 12/15/2021, 11:18 AM  Available via Epic secure chat 7am-7pm After 7 pm, please refer to night coverage provider listed on amion.

## 2021-12-15 NOTE — Progress Notes (Signed)
Orthopaedic Trauma Progress Note  SUBJECTIVE: Doing well this morning, pain controlled. No chest pain. No SOB. No nausea/vomiting. No other complaints. Was able to stand with therapies this morning  OBJECTIVE:  Vitals:   12/15/21 0600 12/15/21 0733  BP: 113/65   Pulse: 82   Resp: 13   Temp:  98.3 F (36.8 C)  SpO2: 98%     General: Laying in bed. NAD Respiratory: No increased work of breathing.  RLE: Dressing CDI. Swelling throughout lower leg stable and comparable to contralateral side. Able to wiggle toes. Foot warm and well perfused.   IMAGING: Stable post op imaging.   LABS:  Results for orders placed or performed during the hospital encounter of 12/09/21 (from the past 24 hour(s))  Glucose, capillary     Status: None   Collection Time: 12/14/21 11:08 AM  Result Value Ref Range   Glucose-Capillary 84 70 - 99 mg/dL  Surgical pcr screen     Status: None   Collection Time: 12/14/21 12:14 PM   Specimen: Nasal Mucosa; Nasal Swab  Result Value Ref Range   MRSA, PCR NEGATIVE NEGATIVE   Staphylococcus aureus NEGATIVE NEGATIVE  Glucose, capillary     Status: None   Collection Time: 12/14/21  2:10 PM  Result Value Ref Range   Glucose-Capillary 82 70 - 99 mg/dL  Type and screen [place]     Status: None   Collection Time: 12/14/21  4:05 PM  Result Value Ref Range   ABO/RH(D) A POS    Antibody Screen NEG    Sample Expiration      12/17/2021,2359 Performed at [facility], [place] [address] [place], [place] [address]   ABO/Rh     Status: None   Collection Time: 12/14/21  4:10 PM  Result Value Ref Range   ABO/RH(D)      A POS Performed at [facility] [address], [place], [place] [address]   Glucose, capillary     Status: Abnormal   Collection Time: 12/14/21  4:12 PM  Result Value Ref Range   Glucose-Capillary 117 (H) 70 - 99 mg/dL  Glucose, capillary     Status: Abnormal   Collection Time: 12/14/21  6:07 PM  Result Value Ref Range    Glucose-Capillary 144 (H) 70 - 99 mg/dL  Glucose, capillary     Status: Abnormal   Collection Time: 12/14/21  8:29 PM  Result Value Ref Range   Glucose-Capillary 162 (H) 70 - 99 mg/dL  Glucose, capillary     Status: Abnormal   Collection Time: 12/14/21 11:11 PM  Result Value Ref Range   Glucose-Capillary 355 (H) 70 - 99 mg/dL  Heparin level (unfractionated)     Status: Abnormal   Collection Time: 12/15/21  2:04 AM  Result Value Ref Range   Heparin Unfractionated <0.10 (L) 0.30 - 0.70 IU/mL  Basic metabolic panel     Status: Abnormal   Collection Time: 12/15/21  2:04 AM  Result Value Ref Range   Sodium 135 135 - 145 mmol/L   Potassium 3.8 3.5 - 5.1 mmol/L   Chloride 94 (L) 98 - 111 mmol/L   CO2 24 22 - 32 mmol/L   Glucose, Bld 478 (H) 70 - 99 mg/dL   BUN 15 6 - 20 mg/dL   Creatinine, Ser 0.85 0.44 - 1.00 mg/dL   Calcium 8.8 (L) 8.9 - 10.3 mg/dL   GFR, Estimated >60 >60 mL/min   Anion gap 17 (H) 5 -  15  CBC     Status: Abnormal   Collection Time: 12/15/21  2:04 AM  Result Value Ref Range   WBC 7.8 4.0 - 10.5 K/uL   RBC 3.57 (L) 3.87 - 5.11 MIL/uL   Hemoglobin 9.9 (L) 12.0 - 15.0 g/dL   HCT 31.5 (L) 36.0 - 46.0 %   MCV 88.2 80.0 - 100.0 fL   MCH 27.7 26.0 - 34.0 pg   MCHC 31.4 30.0 - 36.0 g/dL   RDW 15.2 11.5 - 15.5 %   Platelets 222 150 - 400 K/uL   nRBC 0.0 0.0 - 0.2 %  Glucose, capillary     Status: Abnormal   Collection Time: 12/15/21  7:31 AM  Result Value Ref Range   Glucose-Capillary 362 (H) 70 - 99 mg/dL    ASSESSMENT: Beth Carroll is a 51 y.o. female, 1 Day Post-Op s/p OPEN REDUCTION INTERNAL FIXATION RIGHT DISTAL FEMUR  CV/Blood loss: Acute blood loss anemia, Hgb 9.9 this AM. Hemodynamically stable  PLAN: Weightbearing: NWB RLE ROM:  Ok for knee ROM as tolerated  Incisional and dressing care: Reinforce dressings as needed  Showering: Okay to begin getting incisions wet 12/17/2021 if no drainage Orthopedic device(s): None  Pain management:  1. Tylenol  650 mg q 6 hours PRN 2. Robaxin 500 mg q 6 hours PRN 3.  Percocet 5-325 mg q 4 hours PRN 4. Dilaudid 0.5 mg q 4 hours PRN VTE prophylaxis:  Restart Eliquis today , SCDs ID:  Ancef 2gm post op Foley/Lines:  No foley, KVO IVFs Impediments to Fracture Healing: Vitamin D level 83 when checked on 12/12/2021.  No additional supplementation indicated Dispo: PT/OT evaluation today, currently recommending SNF.  We will plan to remove/change RLE dressing tomorrow 12/16/2021.  Okay for discharge from ortho standpoint once cleared by medicine team and therapies  D/C recommendations: -Percocet and Robaxin for pain control -Home dose Eliquis for DVT prophylaxis -No need for additional Vit D supplementation  Follow - up plan: 2 weeks after discharge for wound check and repeat x-rays   Contact information:  Katha Hamming MD, Rushie Nyhan PA-C. After hours and holidays please check [email or web address] for group call information for [facility]   Gwinda Passe, PA-C [phone] (office) [email or web address]

## 2021-12-15 NOTE — Evaluation (Signed)
Physical Therapy Evaluation Patient Details Name: Beth Carroll MRN: [id number] DOB: [birth year] Today's Date: 12/15/2021  History of Present Illness  Pt is a 51 y/o female who presented after a fall (3 days prior to presentation to ED) resulting in intra-articular fx of R lateral femoral condyle. Pt admitted to ICU for septic shock due to UTI. Pt underwent ORIF of R LE on 11/20. PMH: DM2, CAD, PAD, GERD, B heel pressure wounds.   Clinical Impression  Pt admitted with above diagnosis. PTA pt lived at home with her boyfriend, independent. Pt currently with functional limitations due to the deficits listed below (see PT Problem List). On eval, pt required +2 mod assist bed mobility, and +2 max assist standing attempt with RW. Unable to attain full upright stance. Difficulty with maintaining NWB RLE. Increased time to complete all mobility. BP soft but stable. Pt will benefit from skilled PT to increase their independence and safety with mobility to allow discharge to the venue listed below.         Recommendations for follow up therapy are one component of a multi-disciplinary discharge planning process, led by the attending physician.  Recommendations may be updated based on patient status, additional functional criteria and insurance authorization.  Follow Up Recommendations Skilled nursing-short term rehab (<3 hours/day) Can patient physically be transported by private vehicle: No    Assistance Recommended at Discharge Frequent or constant Supervision/Assistance  Patient can return home with the following  Two people to help with walking and/or transfers;Two people to help with bathing/dressing/bathroom;Help with stairs or ramp for entrance;Assist for transportation;Assistance with cooking/housework    Equipment Recommendations None recommended by PT  Recommendations for Other Services       Functional Status Assessment Patient has had a recent decline in their functional status and  demonstrates the ability to make significant improvements in function in a reasonable and predictable amount of time.     Precautions / Restrictions Precautions Precautions: Fall;Other (comment) Precaution Comments: monitor BP Restrictions Weight Bearing Restrictions: Yes RLE Weight Bearing: Non weight bearing      Mobility  Bed Mobility Overal bed mobility: Needs Assistance Bed Mobility: Supine to Sit, Sit to Supine     Supine to sit: Mod assist, +2 for physical assistance, +2 for safety/equipment, HOB elevated Sit to supine: Mod assist, +2 for physical assistance, +2 for safety/equipment   General bed mobility comments: increased time. Continual cues for sequencing. Assist with BLE and trunk. Educated on using blanket loop around R foot as leg lifter.    Transfers Overall transfer level: Needs assistance Equipment used: Rolling walker (2 wheels) Transfers: Sit to/from Stand Sit to Stand: Max assist, +2 physical assistance, +2 safety/equipment           General transfer comment: +2 max using bed pad to lift at hips. Maintained a squated position. Unsure of ability to maintain NWB RLE, but appeared to be transferring weight through R. Standing trial x 2, < 10 sec/trial    Ambulation/Gait               General Gait Details: unable  Stairs            Wheelchair Mobility    Modified Rankin (Stroke Patients Only)       Balance Overall balance assessment: Needs assistance Sitting-balance support: No upper extremity supported, Feet supported Sitting balance-Leahy Scale: Fair     Standing balance support: Bilateral upper extremity supported, During functional activity, Reliant on assistive device for balance Standing balance-Leahy Scale:  Poor                               Pertinent Vitals/Pain Pain Assessment Pain Assessment: Faces Faces Pain Scale: Hurts little more Pain Location: R LE Pain Descriptors / Indicators: Grimacing,  Guarding Pain Intervention(s): Monitored during session, Limited activity within patient's tolerance, Repositioned, Premedicated before session    [place] expects to be discharged to:: Private residence Living Arrangements: Spouse/significant other Available Help at Discharge: Available PRN/intermittently;Family;Neighbor Type of Home: Mobile home Home Access: Stairs to enter Entrance Stairs-Rails: Can reach both;Left;Right Entrance Stairs-Number of Steps: 3   Home Layout: One level Home Equipment: [place] (2 wheels);BSC/3in1;Shower seat;Wheelchair - manual      Prior Function Prior Level of Function : Independent/Modified Independent;Working/employed;Driving             Mobility Comments: prior to July admission, completely independent with mobility. Since wounds on B heels, pt has been using walker and limiting mobility in the home per MD orders. assist to manage stairs in/out of home ADLs Comments: pt reports independent in ADL/IADL PTA; works in a [employer] prior to July 2023 admission. Has been sponge bathing since then d/t wounds, assist for IADLs     Hand Dominance   Dominant Hand: Right    Extremity/Trunk Assessment   Upper Extremity Assessment Upper Extremity Assessment: Generalized weakness    Lower Extremity Assessment Lower Extremity Assessment: RLE deficits/detail;LLE deficits/detail;Generalized weakness RLE Deficits / Details: s/p ORIF distal femur, edema noted distally RLE: Unable to fully assess due to pain LLE Deficits / Details: edema noted distally    Cervical / Trunk Assessment Cervical / Trunk Assessment: Normal  Communication   Communication: No difficulties  Cognition Arousal/Alertness: Awake/alert Behavior During Therapy: WFL for tasks assessed/performed, Anxious Overall Cognitive Status: No family/caregiver present to determine baseline cognitive functioning                                  General Comments: able to give accurate history, some decreased awareness of deficits and safety precautions. follows directions consistently        General Comments General comments (skin integrity, edema, etc.): daughter sleeping in bedside recliner during eval    Exercises     Assessment/Plan    PT Assessment Patient needs continued PT services  PT Problem List Decreased strength;Decreased balance;Decreased knowledge of precautions;Pain;Decreased mobility;Decreased knowledge of use of DME;Decreased activity tolerance       PT Treatment Interventions DME instruction;Functional mobility training;Balance training;Patient/family education;Therapeutic activities;Gait training;Therapeutic exercise;Stair training    PT Goals (Current goals can be found in the Care Plan section)  Acute Rehab PT Goals Patient Stated Goal: home PT Goal Formulation: With patient Time For Goal Achievement: 12/29/21 Potential to Achieve Goals: Good    Frequency Min 3X/week     Co-evaluation PT/OT/SLP Co-Evaluation/Treatment: Yes Reason for Co-Treatment: For patient/therapist safety;To address functional/ADL transfers PT goals addressed during session: Mobility/safety with mobility;Balance;Proper use of DME OT goals addressed during session: ADL's and self-care;Strengthening/ROM       AM-PAC PT "6 Clicks" Mobility  Outcome Measure Help needed turning from your back to your side while in a flat bed without using bedrails?: A Lot Help needed moving from lying on your back to sitting on the side of a flat bed without using bedrails?: Total Help needed moving to and from a bed to a  chair (including a wheelchair)?: Total Help needed standing up from a chair using your arms (e.g., wheelchair or bedside chair)?: Total Help needed to walk in hospital room?: Total Help needed climbing 3-5 steps with a railing? : Total 6 Click Score: 7    End of Session Equipment Utilized During Treatment: Gait  belt Activity Tolerance: Patient tolerated treatment well Patient left: in bed;with call bell/phone within reach;with family/visitor present Nurse Communication: Mobility status PT Visit Diagnosis: Other abnormalities of gait and mobility (R26.89);Pain Pain - Right/Left: Right Pain - part of body: Leg    Time: 1252-7129 PT Time Calculation (min) (ACUTE ONLY): 30 min   Charges:   PT Evaluation $PT Eval Moderate Complexity: 1 Mod          Lorrin Goodell, PT  Office # [phone] Pager [phone]   Lorriane Shire 12/15/2021, 10:05 AM

## 2021-12-15 NOTE — TOC Initial Note (Signed)
Transition of Care [facility]) - Initial/Assessment Note    Patient Details  Name: Beth Carroll MRN: [id number] Date of Birth: 1970/05/04  Transition of Care [facility]) CM/SW Contact:    Joanne Chars, LCSW Phone Number: 12/15/2021, 12:02 PM  Clinical Narrative:    CSW met with pt regarding DC recommendation for SNF.  Pt does not want to pursue SNF, reports she has [facility] currently providing wound care services.  She would like to DC home and continue with this.  CSW reviewed PT note with pt, pt requiring 2 person assist, unable to walk currently.  Pt reports her boyfriend, her daughter, and her neighbor, and her son Legrand Como are all available to assist.  Permission given to speak with boyfriend Wille Glaser, daughter Janett Billow (goes by Evelena Peat)  Pt reports she currently has walker, bedside commode, and wheelchair in the home.    CSW did speak with daughter Evelena Peat who confirms that they are in support of pt plan to return home.  There will be multiple people in the home to assist once she gets there.               Expected Discharge Plan: [place] Barriers to Discharge: Continued Medical Work up   Patient Goals and CMS Choice Patient states their goals for this hospitalization and ongoing recovery are:: get back to regular routine      Expected Discharge Plan and Services Expected Discharge Plan: [place] In-house Referral: Clinical Social Work   Post Acute Care Choice: [place] arrangements for the past 2 months: [place]                                      Prior Living Arrangements/Services Living arrangements for the past 2 months: Single Family Home Lives with:: Significant Other Patient language and need for interpreter reviewed:: Yes Do you feel safe going back to the place where you live?: Yes      Need for Family Participation in Patient Care: Yes (Comment) Care giver support system in place?: Yes  (comment) Current home services: Home RN [facility]) Criminal Activity/Legal Involvement Pertinent to Current Situation/Hospitalization: No - Comment as needed  Activities of Daily Living      Permission Sought/Granted Permission sought to share information with : Family Supports Permission granted to share information with : Yes, Verbal Permission Granted  Share Information with NAME: boyfriend Joe, daughter Janett Billow           Emotional Assessment Appearance:: Appears stated age Attitude/Demeanor/Rapport: Engaged Affect (typically observed): Appropriate, Pleasant Orientation: : Oriented to Self, Oriented to Place, Oriented to  Time, Oriented to Situation      Admission diagnosis:  Femoral condyle fracture ([place]) [S72.413A] Patient Active Problem List   Diagnosis Date Noted   DKA, type 2 ([place]) 12/13/2021   Sepsis secondary to UTI ([place]) 36/62/9476   Acute metabolic encephalopathy 54/65/0354   Protein-calorie malnutrition, severe 12/12/2021   Femoral condyle fracture ([place]) 12/09/2021   Closed right hip fracture ([place]) 12/08/2021   Traumatic closed fracture of femoral condyle with minimal displacement, unspecified laterality, initial encounter ([place]) 12/08/2021   Atherosclerosis of artery of extremity with rest pain ([place])    Ischemic leg 65/68/1275   Chronic systolic CHF (congestive heart failure) ([place]) 12/22/2020   PVD (peripheral vascular disease) ([place]) 12/22/2020   Ischemic cardiomyopathy    Acute systolic heart failure (  [place])    Diabetes mellitus without complication ([place])    Hypertension    Rest pain of both lower extremities due to atherosclerosis ([place])    Postural dizziness with presyncope    Hyperglycemia due to type 2 diabetes mellitus ([place])    Hypokalemia    Bilateral lower extremity edema    Tobacco abuse 10/23/2020   Controlled type 2 diabetes mellitus with complication, with long-term current use of insulin ([place]) 10/23/2020   Essential hypertension 10/23/2020    Stress fracture of left tibia 07/10/2019   Contusion of left knee 06/06/2019   Pain in left knee 05/08/2019   PCP:  Baxter Hire, MD Pharmacy:  No Pharmacies Listed    Social Determinants of Health (SDOH) Interventions    Readmission Risk Interventions     No data to display

## 2021-12-15 NOTE — Anesthesia Postprocedure Evaluation (Signed)
Anesthesia Post Note  Patient: Beth Carroll  Procedure(s) Performed: OPEN REDUCTION INTERNAL FIXATION RIGHT DISTAL FEMUR (Right: Leg Lower)     Anesthesia Type: General Anesthetic complications: no   No notable events documented.  Last Vitals:  Vitals:   12/15/21 0400 12/15/21 0500  BP: (!) 105/56 109/62  Pulse: 88 85  Resp: 12 14  Temp:    SpO2: 99% 99%    Last Pain:  Vitals:   12/15/21 0340  TempSrc: Oral  PainSc:                  Pervis Hocking

## 2021-12-15 NOTE — Progress Notes (Signed)
Inpatient Diabetes Program Recommendations  AACE/ADA: New Consensus Statement on Inpatient Glycemic Control (2015)  Target Ranges:  Prepandial:   less than 140 mg/dL      Peak postprandial:   less than 180 mg/dL (1-2 hours)      Critically ill patients:  140 - 180 mg/dL   Lab Results  Component Value Date   GLUCAP 362 (H) 12/15/2021   HGBA1C 8.1 (H) 12/10/2021    Review of Glycemic Control  Latest Reference Range & Units 12/14/21 20:29 12/14/21 23:11 12/15/21 07:31 12/15/21 09:50  Glucose-Capillary 70 - 99 mg/dL 162 (H) 355 (H) 362 (H) 362 (H)   Diabetes history: DM  Outpatient Diabetes medications:  Lantus 25 units daily, Novolog 6-7 units tid with meals Ozempic 0.5 mg weekly Farxiga 10 mg daily Current orders for Inpatient glycemic control:  Levemir 13 units bid Novolog moderate tid with meals and HS Novolog 4 units tid with meals   Inpatient Diabetes Program Recommendations:    Note blood sugars increased overnight.  Patient did not receive Levemir yesterday morning and also received Decadron 5 mg intra-op.  Anticipate that blood sugars will improve today.    Thanks,  Adah Perl, RN, BC-ADM Inpatient Diabetes Coordinator Pager [phone]  (8a-5p)

## 2021-12-16 LAB — CBC
HCT: 26.7 % — ABNORMAL LOW (ref 36.0–46.0)
Hemoglobin: 8.2 g/dL — ABNORMAL LOW (ref 12.0–15.0)
MCH: 27.2 pg (ref 26.0–34.0)
MCHC: 30.7 g/dL (ref 30.0–36.0)
MCV: 88.4 fL (ref 80.0–100.0)
Platelets: 224 10*3/uL (ref 150–400)
RBC: 3.02 MIL/uL — ABNORMAL LOW (ref 3.87–5.11)
RDW: 15.3 % (ref 11.5–15.5)
WBC: 6.1 10*3/uL (ref 4.0–10.5)
nRBC: 0 % (ref 0.0–0.2)

## 2021-12-16 LAB — BASIC METABOLIC PANEL
Anion gap: 6 (ref 5–15)
BUN: 10 mg/dL (ref 6–20)
CO2: 31 mmol/L (ref 22–32)
Calcium: 8.8 mg/dL — ABNORMAL LOW (ref 8.9–10.3)
Chloride: 101 mmol/L (ref 98–111)
Creatinine, Ser: 0.39 mg/dL — ABNORMAL LOW (ref 0.44–1.00)
GFR, Estimated: >60 mL/min (ref >60)
Glucose, Bld: 177 mg/dL — ABNORMAL HIGH (ref 70–99)
Potassium: 3 mmol/L — ABNORMAL LOW (ref 3.5–5.1)
Sodium: 138 mmol/L (ref 135–145)

## 2021-12-16 LAB — GLUCOSE, CAPILLARY
Glucose-Capillary: 216 mg/dL — ABNORMAL HIGH (ref 70–99)
Glucose-Capillary: 124 mg/dL — ABNORMAL HIGH (ref 70–99)
Glucose-Capillary: 141 mg/dL — ABNORMAL HIGH (ref 70–99)
Glucose-Capillary: 145 mg/dL — ABNORMAL HIGH (ref 70–99)

## 2021-12-16 MED ORDER — FUROSEMIDE 20 MG PO TABS
20.0000 mg | ORAL_TABLET | Freq: Once | ORAL | Status: AC
  Administered 2021-12-16: 20 mg via ORAL
  Filled 2021-12-16: qty 1

## 2021-12-16 MED ORDER — FUROSEMIDE 40 MG PO TABS
40.0000 mg | ORAL_TABLET | Freq: Every day | ORAL | Status: DC
  Administered 2021-12-17: 40 mg via ORAL
  Filled 2021-12-16: qty 1

## 2021-12-16 MED ORDER — POTASSIUM CHLORIDE CRYS ER 20 MEQ PO TBCR
40.0000 meq | EXTENDED_RELEASE_TABLET | Freq: Once | ORAL | Status: AC
  Administered 2021-12-16: 40 meq via ORAL
  Filled 2021-12-16: qty 2

## 2021-12-16 NOTE — Progress Notes (Addendum)
Orthopaedic Trauma Progress Note  SUBJECTIVE: Doing well this morning, pain controlled. No chest pain. No SOB. No nausea/vomiting. No other complaints.  Patient states she has had home health therapies in the past following previous hospitalizations.  Her goal is to return home with these services in place.  Would prefer to avoid SNF.  Notes her daughter and a neighbor would be available to help at home.  OBJECTIVE:  Vitals:   12/15/21 2055 12/16/21 0756  BP: 121/64 (!) 141/85  Pulse:  91  Resp: 19   Temp: 98.7 F (37.1 C) 98.9 F (37.2 C)  SpO2: 97% 95%    General: Sitting up in bed. NAD Respiratory: No increased work of breathing.  RLE: Dressing removed, incisions clean, dry, intact.  New Mepilex dressing applied directly over the incision. Swelling throughout lower leg stable.  About 20 degree flexion contracture of the knee.  Passively I am able to extend her within about 10 degrees of full extension.  Able to wiggle toes. Foot warm and well perfused.   IMAGING: Stable post op imaging.   LABS:  Results for orders placed or performed during the hospital encounter of 12/09/21 (from the past 24 hour(s))  Glucose, capillary     Status: Abnormal   Collection Time: 12/15/21 11:28 AM  Result Value Ref Range   Glucose-Capillary 355 (H) 70 - 99 mg/dL  Glucose, capillary     Status: None   Collection Time: 12/15/21  4:49 PM  Result Value Ref Range   Glucose-Capillary 90 70 - 99 mg/dL  Glucose, capillary     Status: None   Collection Time: 12/15/21  7:40 PM  Result Value Ref Range   Glucose-Capillary 89 70 - 99 mg/dL  Basic metabolic panel     Status: Abnormal   Collection Time: 12/16/21  4:33 AM  Result Value Ref Range   Sodium 138 135 - 145 mmol/L   Potassium 3.0 (L) 3.5 - 5.1 mmol/L   Chloride 101 98 - 111 mmol/L   CO2 31 22 - 32 mmol/L   Glucose, Bld 177 (H) 70 - 99 mg/dL   BUN 10 6 - 20 mg/dL   Creatinine, Ser 0.39 (L) 0.44 - 1.00 mg/dL   Calcium 8.8 (L) 8.9 - 10.3 mg/dL    GFR, Estimated >60 >60 mL/min   Anion gap 6 5 - 15  CBC     Status: Abnormal   Collection Time: 12/16/21  4:33 AM  Result Value Ref Range   WBC 6.1 4.0 - 10.5 K/uL   RBC 3.02 (L) 3.87 - 5.11 MIL/uL   Hemoglobin 8.2 (L) 12.0 - 15.0 g/dL   HCT 26.7 (L) 36.0 - 46.0 %   MCV 88.4 80.0 - 100.0 fL   MCH 27.2 26.0 - 34.0 pg   MCHC 30.7 30.0 - 36.0 g/dL   RDW 15.3 11.5 - 15.5 %   Platelets 224 150 - 400 K/uL   nRBC 0.0 0.0 - 0.2 %  Glucose, capillary     Status: Abnormal   Collection Time: 12/16/21  8:13 AM  Result Value Ref Range   Glucose-Capillary 216 (H) 70 - 99 mg/dL    ASSESSMENT: Harriett Azar is a 51 y.o. female, 2 Days Post-Op s/p OPEN REDUCTION INTERNAL FIXATION RIGHT DISTAL FEMUR  CV/Blood loss: Acute blood loss anemia, Hgb 8.2 this AM. Hemodynamically stable  PLAN: Weightbearing: NWB RLE ROM:  Ok for knee ROM as tolerated  Incisional and dressing care: Okay to leave incisions open to air if  no drainage.  Change Mepilex as needed Showering: Okay to begin getting incisions wet 12/17/2021 if no drainage Orthopedic device(s): Zero degree bone foam ordered today for RLE Pain management:  1. Tylenol 650 mg q 6 hours PRN 2. Robaxin 500 mg q 6 hours PRN 3. Percocet 5-325 mg q 4 hours PRN 4. Dilaudid 0.5 mg q 4 hours PRN VTE prophylaxis: Eliquis, SCDs ID:  Ancef 2gm post op completed Foley/Lines:  No foley, KVO IVFs Impediments to Fracture Healing: Vitamin D level 83 when checked on 12/12/2021.  No additional supplementation indicated Dispo: PT/OT currently recommending SNF.  Patient would prefer to return home with home health therapies.  Okay for discharge from ortho standpoint once cleared by medicine team and therapies.  We will hold off on writing discharge prescriptions until final dispo is determined  D/C recommendations: -Percocet and Robaxin for pain control -Home dose Eliquis for DVT prophylaxis -No need for additional Vit D supplementation  Follow - up plan: 2  weeks after discharge for wound check and repeat x-rays   Contact information:  Katha Hamming MD, Rushie Nyhan PA-C. After hours and holidays please check [email or web address] for group call information for [facility]   Gwinda Passe, PA-C [phone] (office) [email or web address]

## 2021-12-16 NOTE — Progress Notes (Signed)
[facility]                                                                              Beth Carroll, is a 51 y.o. female, DOB - 1970-12-09, CZY:[id number] Admit date - 12/09/2021    Outpatient Primary MD for the patient is Baxter Hire, MD  LOS - 6  days  No chief complaint on file.      Brief summary   Patient is a 51 year old female with a history of poorly controlled DM type 2, chronic systolic CHF, PAD status post peripheral stenting, tobacco use disorder, reportedly has lost over 100 pounds of weight according to the daughter's boyfriend with significant failure to thrive, severe cachexia smoker quit 1 year ago and COPD not otherwise specified but not on oxygen, HTN, HLP.  Patient has chronic right heel ischemic ulceration, status post thrombolysis and thrombectomy for lower lobe claudication in July 2023, benign meningioma occipital lobe. Patient was admitted to [facility] ER on 11/14 after a fall on 11/8 and right knee pain.  Patient had spent few days in recliner and upon EMS arrival was found to be saturated in urine.  In ED, was discovered to have right femoral condyle fracture along with UTI, stage 1-2 sacral wound.  Initially alert with normal vital signs, she was started on pain control and IV Rocephin for UTI.  Patient was then subsequently transferred to [facility] and was found to be hypothermic, hypotensive, lethargic and weak.  ABG with severe metabolic acidosis and CCM was consulted.  11/15 admitted to [facility] as [facility] for fracture repair 11/15 overnight transferred from [facility] med surg to ICU for hypotension, acidosis, lethargy.  Was placed on vasopressors, found to have DKA. Urine culture 11/14 >> E. Coli, pan-sens Blood cultures 11/16 >> ngtd  Transferred to [facility] on 11/19 11/20: Open reduction internal fixation of the right lateral femoral condyle fracture 11/21 : PT recommended SNF for rehab  Assessment & Plan    Principal Problem:    DKA, type 2 ([place]) in the setting of diabetes mellitus type 2, uncontrolled with hyperglycemia Anion gap metabolic acidosis -Hemoglobin A1c 8.1 on 12/10/2021, received IV insulin and aggressive IV fluids.   -SLP evaluation completed, on 11/19, cortrack and tube feeds discontinued. -Started on carb modified diet -Will hold Farxiga and Ozempic at discharge and will need to follow-up with her endocrinologist, Dr. Gabriel Carina -CBGs improving today -Continue Levemir 13 units twice daily, NovoLog 4 units 3 times daily AC, SSI moderate   Active Problems:   Traumatic closed fracture of femoral condyle with minimal displacement, Right knee -Orthopedics consulted, underwent ORIF right lateral femoral condyle fracture, postop day # 2 -PT recommending SNF -Continue pain control, on Eliquis for DVT prophylaxis  Acute kidney injury, hypokalemia -Creatinine improved 0.3 - potassium low 3.0 replaced  Severe hypophosphatemia -Likely due to severe protein calorie malnutrition, <1.0 on 11/17 -Improving  Septic shock secondary to UTI -Vasopressors now off, urine culture showed E. Coli -Complete 7-day course of IV Rocephin    Hypertension -BP now soft, holding Coreg  -Continue Lasix 20 mg daily    Bilateral lower extremity edema,  acute on chronic combined systolic and diastolic CHF -Likely fluid overload due to aggressive fluid resuscitation, septic shock on admit, also has chronic b/l LE edema -2D echo 06/2021 had shown EF of 40 to 45% with G2 DD -Received Lasix 40 mg IV daily x3, now placed on oral Lasix '40mg'$  daily  PAD on anticoagulation -Eliquis resumed    Acute metabolic encephalopathy -Currently resolved, alert and oriented x3  COPD, tobacco use disorder -Currently stable, as needed DuoNebs -Counseled on smoking cessation  Pressure injury documentation Sacrum stage II, POA Right buttocks deep tissue pressure injury, POA -Management per nursing and wound care  Severe protein calorie  malnutrition Signs/Symptoms: severe fat depletion, severe muscle depletion Estimated body mass index is 18.82 kg/m as calculated from the following:   Height as of this encounter: '5\' 6"'$  (1.676 m).   Weight as of this encounter: 52.9 kg.  Code Status:  full code  DVT Prophylaxis:  apixaban (ELIQUIS) tablet 5 mg  Level of Care: Level of care: Telemetry Medical Family Communication: Updated patient.  Daughter present in the room  Disposition Plan:      Remains inpatient appropriate: Hopefully DC home in next 24 to 48 hours.  Patient thinking of home health PT rather than SNF, will discuss with her family.  Procedures:  12/14/2021 open reduction internal fixation of right lateral femoral condyle fracture   Consultants:   PCCM Orthopedics  Antimicrobials:   Anti-infectives (From admission, onward)    Start     Dose/Rate Route Frequency Ordered Stop   12/14/21 1604  vancomycin (VANCOCIN) powder  Status:  Discontinued          As needed 12/14/21 1604 12/14/21 1758   12/14/21 1500  ceFAZolin (ANCEF) IVPB 2g/100 mL premix        2 g 200 mL/hr over 30 Minutes Intravenous To Surgery 12/14/21 1154 12/14/21 1635   12/11/21 1400  cefTRIAXone (ROCEPHIN) 2 g in sodium chloride 0.9 % 100 mL IVPB        2 g 200 mL/hr over 30 Minutes Intravenous Every 24 hours 12/11/21 1312 12/16/21 2359   12/10/21 2200  vancomycin (VANCOREADY) IVPB 1250 mg/250 mL  Status:  Discontinued        1,250 mg 166.7 mL/hr over 90 Minutes Intravenous Every 24 hours 12/10/21 0233 12/10/21 0842   12/10/21 0315  piperacillin-tazobactam (ZOSYN) IVPB 3.375 g  Status:  Discontinued        3.375 g 12.5 mL/hr over 240 Minutes Intravenous Every 8 hours 12/10/21 0219 12/11/21 1312   12/10/21 0315  vancomycin (VANCOCIN) IVPB 1000 mg/200 mL premix        1,000 mg 200 mL/hr over 60 Minutes Intravenous  Once 12/10/21 0219 12/10/21 0355   12/09/21 2359  cefTRIAXone (ROCEPHIN) 2 g in sodium chloride 0.9 % 100 mL IVPB  Status:   Discontinued        2 g 200 mL/hr over 30 Minutes Intravenous Daily at bedtime 12/09/21 2312 12/10/21 0135          Medications  apixaban  5 mg Oral BID   budesonide (PULMICORT) nebulizer solution  0.25 mg Nebulization BID   Chlorhexidine Gluconate Cloth  6 each Topical Daily   docusate sodium  100 mg Oral BID   feeding supplement  237 mL Oral TID BM   folic acid  1 mg Oral Daily   furosemide  20 mg Oral Daily   insulin aspart  0-15 Units Subcutaneous TID WC   insulin aspart  0-5  Units Subcutaneous QHS   insulin aspart  4 Units Subcutaneous TID WC   insulin detemir  13 Units Subcutaneous BID   leptospermum manuka honey  1 Application Topical Daily   multivitamin with minerals  1 tablet Oral Daily   nutrition supplement (JUVEN)  1 packet Oral BID BM   pantoprazole  40 mg Oral Daily   potassium chloride  40 mEq Oral Once   thiamine  100 mg Oral Daily      Subjective:   Beth Carroll was seen and examined today.  Feeling better, postop day #2, pain is controlled.  States still has difficulty getting up to the commode, wants PureWick placed.    Objective:   Vitals:   12/15/21 0825 12/15/21 0831 12/15/21 0832 12/15/21 0845  BP: 96/70 102/65  (!) 101/58  Pulse: 86   80  Resp: '16 11  12  '$ Temp:      TempSrc:      SpO2: 100%  100% 93%  Weight:      Height:        Intake/Output Summary (Last 24 hours) at 12/15/2021 1118 Last data filed at 12/15/2021 1011 Gross per 24 hour  Intake 2115.01 ml  Output 1600 ml  Net 515.01 ml     Wt Readings from Last 3 Encounters:  12/14/21 52.9 kg  09/18/21 53.5 kg  08/26/21 56.9 kg    Physical Exam General: Alert and oriented x 3, NAD Cardiovascular: S1 S2 clear, RRR.  Respiratory: CTAB, no wheezing Gastrointestinal: Soft, nontender, nondistended, NBS Ext: 2+ pedal edema bilaterally, improving Psych: Normal affect    Data Reviewed:  I have personally reviewed following labs    CBC Lab Results  Component Value Date    WBC 7.8 12/15/2021   RBC 3.57 (L) 12/15/2021   HGB 9.9 (L) 12/15/2021   HCT 31.5 (L) 12/15/2021   MCV 88.2 12/15/2021   MCH 27.7 12/15/2021   PLT 222 12/15/2021   MCHC 31.4 12/15/2021   RDW 15.2 12/15/2021   LYMPHSABS 0.6 (L) 12/10/2021   MONOABS 0.4 12/10/2021   EOSABS 0.0 12/10/2021   BASOSABS 0.0 14/78/2956     Last metabolic panel Lab Results  Component Value Date   NA 135 12/15/2021   K 3.8 12/15/2021   CL 94 (L) 12/15/2021   CO2 24 12/15/2021   BUN 15 12/15/2021   CREATININE 0.85 12/15/2021   GLUCOSE 478 (H) 12/15/2021   GFRNONAA >60 12/15/2021   CALCIUM 8.8 (L) 12/15/2021   PHOS 3.2 12/14/2021   PROT 5.2 (L) 12/14/2021   ALBUMIN 2.2 (L) 12/14/2021   BILITOT 0.2 (L) 12/14/2021   ALKPHOS 48 12/14/2021   AST 14 (L) 12/14/2021   ALT 10 12/14/2021   ANIONGAP 17 (H) 12/15/2021    CBG (last 3)  Recent Labs    12/14/21 2311 12/15/21 0731 12/15/21 0950  GLUCAP 355* 362* 362*      Coagulation Profile: No results for input(s): "INR", "PROTIME" in the last 168 hours.   Radiology Studies: I have personally reviewed the imaging studies  DG Knee 1-2 Views Right  Result Date: 12/14/2021 CLINICAL DATA:  Open reduction internal fixation of right distal femoral fracture. EXAM: RIGHT KNEE - 1-2 VIEW COMPARISON:  12/08/2021. FINDINGS: Eight fluoroscopic images were obtained intraoperatively. Total fluoroscopy time is 55 seconds. Dose: 2.15 mGy. There has been interval placement of fixation hardware in the distal right femur 4 lateral condyle fracture. Please see operative report for additional information. IMPRESSION: Intraoperative utilization of fluoroscopy. Electronically Signed  By: Brett Fairy M.D.   On: 12/14/2021 20:22   DG C-Arm 1-60 Min-No Report  Result Date: 12/14/2021 Fluoroscopy was utilized by the requesting physician.  No radiographic interpretation.   DG C-Arm 1-60 Min-No Report  Result Date: 12/14/2021 Fluoroscopy was utilized by the requesting  physician.  No radiographic interpretation.   DG Knee Right Port  Result Date: 12/14/2021 CLINICAL DATA:  Fracture, postop. EXAM: PORTABLE RIGHT KNEE - 1-2 VIEW COMPARISON:  Preoperative imaging. FINDINGS: Lateral plate and multi screw fixation of distal femur fracture. Improved fracture alignment from preoperative exam. Recent postsurgical change includes air and edema in the joint space and soft tissues. Extensive vascular stent. The bones are under mineralized IMPRESSION: ORIF of distal femur fracture with improved fracture alignment from preoperative exam. Electronically Signed   By: Keith Rake M.D.   On: 12/14/2021 19:40       Athaliah Baumbach M.D. [facility] 12/15/2021, 11:18 AM  Available via Epic secure chat 7am-7pm After 7 pm, please refer to night coverage provider listed on amion.

## 2021-12-16 NOTE — Discharge Instructions (Signed)
Orthopaedic Trauma Service Discharge Instructions   General Discharge Instructions  WEIGHT BEARING STATUS:non-weightbearing right lower extremity  RANGE OF MOTION/ACTIVITY: Ok for gentle knee range of motion as tolerated  Wound Care: You may remove your surgical dressings.  Incisions can be left open to air if there is no drainage. If incision continues to have drainage, follow wound care instructions below. Okay to shower if no drainage from incisions.  DVT/PE prophylaxis:  Eliquis 2.5 mg twice daily x30 days  Diet: as you were eating previously.  Can use over the counter stool softeners and bowel preparations, such as Miralax, to help with bowel movements.  Narcotics can be constipating.  Be sure to drink plenty of fluids  PAIN MEDICATION USE AND EXPECTATIONS  You have likely been given narcotic medications to help control your pain.  After a traumatic event that results in an fracture (broken bone) with or without surgery, it is ok to use narcotic pain medications to help control one's pain.  We understand that everyone responds to pain differently and each individual patient will be evaluated on a regular basis for the continued need for narcotic medications. Ideally, narcotic medication use should last no more than 6-8 weeks (coinciding with fracture healing).   As a patient it is your responsibility as well to monitor narcotic medication use and report the amount and frequency you use these medications when you come to your office visit.   We would also advise that if you are using narcotic medications, you should take a dose prior to therapy to maximize you participation.  IF YOU ARE ON NARCOTIC MEDICATIONS IT IS NOT PERMISSIBLE TO OPERATE A MOTOR VEHICLE (MOTORCYCLE/CAR/TRUCK/MOPED) OR HEAVY MACHINERY DO NOT MIX NARCOTICS WITH OTHER CNS (CENTRAL NERVOUS SYSTEM) DEPRESSANTS SUCH AS ALCOHOL   STOP SMOKING OR USING NICOTINE PRODUCTS!!!!  As discussed nicotine severely impairs your  body's ability to heal surgical and traumatic wounds but also impairs bone healing.  Wounds and bone heal by forming microscopic blood vessels (angiogenesis) and nicotine is a vasoconstrictor (essentially, shrinks blood vessels).  Therefore, if vasoconstriction occurs to these microscopic blood vessels they essentially disappear and are unable to deliver necessary nutrients to the healing tissue.  This is one modifiable factor that you can do to dramatically increase your chances of healing your injury.    (This means no smoking, no nicotine gum, patches, etc)  DO NOT USE NONSTEROIDAL ANTI-INFLAMMATORY DRUGS (NSAID'S)  Using products such as Advil (ibuprofen), Aleve (naproxen), Motrin (ibuprofen) for additional pain control during fracture healing can delay and/or prevent the healing response.  If you would like to take over the counter (OTC) medication, Tylenol (acetaminophen) is ok.  However, some narcotic medications that are given for pain control contain acetaminophen as well. Therefore, you should not exceed more than 4000 mg of tylenol in a day if you do not have liver disease.  Also note that there are may OTC medicines, such as cold medicines and allergy medicines that my contain tylenol as well.  If you have any questions about medications and/or interactions please ask your doctor/PA or your pharmacist.      ICE AND ELEVATE INJURED/OPERATIVE EXTREMITY  Using ice and elevating the injured extremity above your heart can help with swelling and pain control.  Icing in a pulsatile fashion, such as 20 minutes on and 20 minutes off, can be followed.    Do not place ice directly on skin. Make sure there is a barrier between to skin and the ice  pack.    Using frozen items such as frozen peas works well as the conform nicely to the are that needs to be iced.  USE AN ACE WRAP OR TED HOSE FOR SWELLING CONTROL  In addition to icing and elevation, Ace wraps or TED hose are used to help limit and resolve  swelling.  It is recommended to use Ace wraps or TED hose until you are informed to stop.    When using Ace Wraps start the wrapping distally (farthest away from the body) and wrap proximally (closer to the body)   Example: If you had surgery on your leg or thing and you do not have a splint on, start the ace wrap at the toes and work your way up to the thigh        If you had surgery on your upper extremity and do not have a splint on, start the ace wrap at your fingers and work your way up to the upper arm  [place]: [phone]   VISIT OUR WEBSITE FOR ADDITIONAL INFORMATION: [email or web address]     Discharge Wound Care Instructions  Do NOT apply any ointments, solutions or lotions to pin sites or surgical wounds.  These prevent needed drainage and even though solutions like hydrogen peroxide kill bacteria, they also damage cells lining the pin sites that help fight infection.  Applying lotions or ointments can keep the wounds moist and can cause them to breakdown and open up as well. This can increase the risk for infection. When in doubt call the office.    If any drainage is noted, use one layer of adaptic or Mepitel, then gauze, Kerlix, and an ace wrap. - These dressing supplies should be available at local medical supply stores [facility], [facility], etc) as well as Management consultant ([employer], [facility], [place], etc)  Once the incision is completely dry and without drainage, it may be left open to air out.  Showering may begin 36-48 hours later.  Cleaning gently with soap and water.

## 2021-12-16 NOTE — Plan of Care (Signed)
  Problem: Education: Goal: Knowledge of General Education information will improve Description: Including pain rating scale, medication(s)/side effects and non-pharmacologic comfort measures Outcome: Not Progressing   Problem: Health Behavior/Discharge Planning: Goal: Ability to manage health-related needs will improve Outcome: Not Progressing   Problem: Clinical Measurements: Goal: Ability to maintain clinical measurements within normal limits will improve Outcome: Not Progressing Goal: Will remain free from infection Outcome: Not Progressing Goal: Diagnostic test results will improve Outcome: Not Progressing Goal: Respiratory complications will improve Outcome: Not Progressing Goal: Cardiovascular complication will be avoided Outcome: Not Progressing   Problem: Elimination: Goal: Will not experience complications related to bowel motility Outcome: Not Progressing Goal: Will not experience complications related to urinary retention Outcome: Not Progressing   Problem: Pain Managment: Goal: General experience of comfort will improve Outcome: Not Progressing   Problem: Safety: Goal: Ability to remain free from injury will improve Outcome: Not Progressing   

## 2021-12-16 NOTE — Progress Notes (Addendum)
Orthopedic Tech Progress Note Patient Details:  Beth Carroll [birth year] [id number] Removed bone foam from patient, she had a lot of discomfort with it on so I rolled up a PAD and placed it under the ankle and floated the heels   Ortho Devices Type of Ortho Device: Bone foam zero knee Ortho Device/Splint Location: RLE Ortho Device/Splint Interventions: Ordered, Application, Adjustment, Removal   Post Interventions Patient Tolerated: Poor Instructions Provided: Care of device  [name] 12/16/2021, 11:08 AM

## 2021-12-16 NOTE — Progress Notes (Addendum)
Physical Therapy Treatment Patient Details Name: Beth Carroll MRN: [id number] DOB: 1970-02-02 Today's Date: 12/16/2021   History of Present Illness Pt is a 51 y/o female who presented after a fall (3 days prior to presentation to ED) resulting in R intra-articular fx of R lateral femoral condyle. Pt admitted to ICU for septic shock due to UTI. Pt underwent ORIF of R LE on 11/20. PMH: DM2, CAD, PAD, GERD, B heel pressure wounds.    PT Comments    Pt received in supine, pleasantly agreeable to therapy session, eager to progress independence and with decreased insight into deficits and good effort for functional mobility tasks. Pt requiring increased time/cues to initiate and perform transfers, including seated lateral scooting along EOB and slide board transfers to drop arm recliner. Extensive time spent on LE HEP handout teach-back, importance of BLE elevation and ROM due to pedal edema (pt would benefit from Prevalon boots due to significant B plantarflexion and pt with LLE apparent foot drop), use of gait belt as leg lifter and for B ankle DF stretch. Pt needing +2 mod to maxA for bed mobility and slide board transfer, not yet safe for DC home as pt will need additional DME (updated below after discussion with supervising PT Bunnie Philips), and will need additional caregiver instruction to ensure pt safety with transfers. Pt having difficulty maintaining RLE NWB and needed manual assist during transfer. Pt continues to benefit from PT services to progress toward functional mobility goals.  Pt likely to need PTAR for transport home and wheelchair Lucianne Lei if needing to get to [place] appointments once home.   Recommendations for follow up therapy are one component of a multi-disciplinary discharge planning process, led by the attending physician.  Recommendations may be updated based on patient status, additional functional criteria and insurance authorization.  Follow Up Recommendations  Skilled nursing-short  term rehab (<3 hours/day) (pt/family hoping to work toward home but currently recommend SNF; unclear if pt would be able to get HHPT due to insurance) Can patient physically be transported by private vehicle: No   Assistance Recommended at Discharge Frequent or constant Supervision/Assistance  Patient can return home with the following Two people to help with walking and/or transfers;Two people to help with bathing/dressing/bathroom;Help with stairs or ramp for entrance;Assist for transportation;Assistance with cooking/housework   Equipment Recommendations  Other (comment);Hospital bed (mechanical lift, slide board)    Recommendations for Other Services       Precautions / Restrictions Precautions Precautions: Fall;Other (comment) Precaution Comments: monitor BP, B heel wounds Restrictions Weight Bearing Restrictions: Yes RLE Weight Bearing: Non weight bearing     Mobility  Bed Mobility Overal bed mobility: Needs Assistance Bed Mobility: Supine to Sit, Rolling Rolling: Mod assist   Supine to sit: Mod assist, +2 for physical assistance, +2 for safety/equipment     General bed mobility comments: pt requesting increased time throughout, educated on use of gait belt as leg lifter around R foot to assist LE to EOB. Cues for hand placement, sequencing throughout. Use of bed rails to assist with lifting trunk; pt daughter present and assisting for safety with instruction from PTA.    Transfers Overall transfer level: Needs assistance Equipment used: Sliding board Transfers: Bed to chair/wheelchair/BSC Sit to Stand: Max assist, +2 physical assistance          Lateral/Scoot Transfers: Max assist, +2 physical assistance, With slide board General transfer comment: dense multimodal cues after intial demo for safe technique, pt/daughter with decreased carryover of information but with  good effort throughout. Manual assist for BLE placement to prevent RLE WB and ensure LLE is able to get  leverage while scooting. Pt c/o R IV on hand dorsal surface limiting her ability to scoot due to pain.    Ambulation/Gait               General Gait Details: unable   Stairs             Wheelchair Mobility    Modified Rankin (Stroke Patients Only)       Balance Overall balance assessment: Needs assistance Sitting-balance support: No upper extremity supported, Feet supported Sitting balance-Leahy Scale: Fair Sitting balance - Comments: able to weight shift and static sit unsupported, min guard for safety with lateral leans/elbow taps                                    Cognition Arousal/Alertness: Awake/alert Behavior During Therapy: WFL for tasks assessed/performed, Anxious Overall Cognitive Status: No family/caregiver present to determine baseline cognitive functioning                                 General Comments: Pt able to give accurate history, some decreased awareness of deficits and safety precautions. She follows directions consistently with greatly increased time. Pt daughter present and assisting when given specific 1-step instructions.        Exercises Other Exercises Other Exercises: supine BLE AAROM: ankle pumps (PROM on LLE), hip abduction, heel slides (very limited on RLE due to guarding/pain) x10 reps ea; handout brought to room for seated/supine exercises and reviewed freq/technique with pt and daughter Other Exercises: lateral leans with elbow taps x 3 reps ea side Other Exercises: seated chair push-ups x5 reps in long sitting Other Exercises: LLE heel cord stretch x30 sec, pt given teachback using gait belt but will need assist from staff/family to perform safely    General Comments General comments (skin integrity, edema, etc.): BP 135/69 (87) supine prior to OOB and HR 80's bpm with exertion; SpO2 100% on RA. Noted significant pedal edema, more on RLE than LLE, encouraged BLE elevation and ankle pumps. Pt with  LLE apparent foot drop/pt unable to perform ankle pumps, she would benefit from Prevalon boots  for heel protection and to promote neutral ankle posture at rest.      Pertinent Vitals/Pain Pain Assessment Pain Assessment: Faces Pain Score: 8  Faces Pain Scale: Hurts even more Facial Expression: Grimacing Body Movements: Protection Muscle Tension: Relaxed Compliance with ventilator (intubated pts.): N/A Vocalization (extubated pts.): N/A CPOT Total: 3 Pain Location: R LE, from foot to hip, pt states worse at hip/knee but also crying out when socks donned Pain Descriptors / Indicators: Grimacing, Guarding, Moaning, Sharp, Tender Pain Intervention(s): Limited activity within patient's tolerance, Monitored during session, Premedicated before session, Repositioned, Other (comment) (pt defers ice pack)     PT Goals (current goals can now be found in the care plan section) Acute Rehab PT Goals Patient Stated Goal: home PT Goal Formulation: With patient Time For Goal Achievement: 12/29/21 Progress towards PT goals: Progressing toward goals    Frequency    Min 3X/week      PT Plan Current plan remains appropriate       AM-PAC PT "6 Clicks" Mobility   Outcome Measure  Help needed turning from your back to your side while  in a flat bed without using bedrails?: A Lot Help needed moving from lying on your back to sitting on the side of a flat bed without using bedrails?: Total (from flat bed with no rails will need +2 assist) Help needed moving to and from a bed to a chair (including a wheelchair)?: Total (+2 maxA) Help needed standing up from a chair using your arms (e.g., wheelchair or bedside chair)?: Total Help needed to walk in hospital room?: Total Help needed climbing 3-5 steps with a railing? : Total 6 Click Score: 7    End of Session Equipment Utilized During Treatment: Gait belt Activity Tolerance: Patient tolerated treatment well;Patient limited by pain Patient left:  in chair;with call bell/phone within reach;with chair alarm set;with family/visitor present;Other (comment) (geomat cushion in chair for pressure relief) Nurse Communication: Mobility status;Need for lift equipment;Precautions;Other (comment);Patient requests pain meds (pt needs Prevalon boots and maxi sky lift pad (none in storage room) with loops (or bring maximove with clip pad to room)) PT Visit Diagnosis: Other abnormalities of gait and mobility (R26.89);Pain Pain - Right/Left: Right Pain - part of body: Leg     Time: 1510-1603 PT Time Calculation (min) (ACUTE ONLY): 53 min  Charges:  $Therapeutic Exercise: 23-37 mins $Therapeutic Activity: 23-37 mins                     Ebany Bowermaster P., PTA Acute Rehabilitation Services Secure Chat Preferred 9a-5:30pm Office: [place] 12/16/2021, 5:46 PM

## 2021-12-16 NOTE — Addendum Note (Signed)
Addendum  created 12/16/21 0857 by Josephine Igo, CRNA   Intraprocedure Meds edited

## 2021-12-17 ENCOUNTER — Encounter (HOSPITAL_COMMUNITY): Payer: Self-pay | Admitting: Internal Medicine

## 2021-12-17 ENCOUNTER — Other Ambulatory Visit: Payer: Self-pay

## 2021-12-17 DIAGNOSIS — R6521 Severe sepsis with septic shock: Secondary | ICD-10-CM

## 2021-12-17 DIAGNOSIS — I2510 Atherosclerotic heart disease of native coronary artery without angina pectoris: Secondary | ICD-10-CM

## 2021-12-17 DIAGNOSIS — A4151 Sepsis due to Escherichia coli [E. coli]: Principal | ICD-10-CM

## 2021-12-17 DIAGNOSIS — I5043 Acute on chronic combined systolic (congestive) and diastolic (congestive) heart failure: Secondary | ICD-10-CM | POA: Insufficient documentation

## 2021-12-17 LAB — CBC
HCT: 25.6 % — ABNORMAL LOW (ref 36.0–46.0)
Hemoglobin: 8.4 g/dL — ABNORMAL LOW (ref 12.0–15.0)
MCH: 28.6 pg (ref 26.0–34.0)
MCHC: 32.8 g/dL (ref 30.0–36.0)
MCV: 87.1 fL (ref 80.0–100.0)
Platelets: 232 10*3/uL (ref 150–400)
RBC: 2.94 MIL/uL — ABNORMAL LOW (ref 3.87–5.11)
RDW: 15.1 % (ref 11.5–15.5)
WBC: 5.4 10*3/uL (ref 4.0–10.5)
nRBC: 0 % (ref 0.0–0.2)

## 2021-12-17 LAB — BASIC METABOLIC PANEL
Anion gap: 12 (ref 5–15)
BUN: 12 mg/dL (ref 6–20)
CO2: 30 mmol/L (ref 22–32)
Calcium: 8.8 mg/dL — ABNORMAL LOW (ref 8.9–10.3)
Chloride: 92 mmol/L — ABNORMAL LOW (ref 98–111)
Creatinine, Ser: 0.47 mg/dL (ref 0.44–1.00)
GFR, Estimated: >60 mL/min (ref >60)
Glucose, Bld: 298 mg/dL — ABNORMAL HIGH (ref 70–99)
Potassium: 3 mmol/L — ABNORMAL LOW (ref 3.5–5.1)
Sodium: 134 mmol/L — ABNORMAL LOW (ref 135–145)

## 2021-12-17 LAB — PHOSPHORUS: Phosphorus: 2.9 mg/dL (ref 2.5–4.6)

## 2021-12-17 LAB — GLUCOSE, CAPILLARY
Glucose-Capillary: 203 mg/dL — ABNORMAL HIGH (ref 70–99)
Glucose-Capillary: 100 mg/dL — ABNORMAL HIGH (ref 70–99)
Glucose-Capillary: 228 mg/dL — ABNORMAL HIGH (ref 70–99)
Glucose-Capillary: 277 mg/dL — ABNORMAL HIGH (ref 70–99)
Glucose-Capillary: 251 mg/dL — ABNORMAL HIGH (ref 70–99)

## 2021-12-17 LAB — MAGNESIUM: Magnesium: 1.8 mg/dL (ref 1.7–2.4)

## 2021-12-17 MED ORDER — ASPIRIN 81 MG PO TBEC
81.0000 mg | DELAYED_RELEASE_TABLET | Freq: Every day | ORAL | Status: DC
  Administered 2021-12-17 – 2021-12-18 (×2): 81 mg via ORAL
  Filled 2021-12-17 (×2): qty 1

## 2021-12-17 MED ORDER — MAGNESIUM SULFATE 2 GM/50ML IV SOLN
2.0000 g | Freq: Once | INTRAVENOUS | Status: AC
  Administered 2021-12-17: 2 g via INTRAVENOUS
  Filled 2021-12-17: qty 50

## 2021-12-17 MED ORDER — INSULIN GLARGINE-YFGN 100 UNIT/ML ~~LOC~~ SOLN
25.0000 [IU] | Freq: Every day | SUBCUTANEOUS | Status: DC
  Administered 2021-12-17: 25 [IU] via SUBCUTANEOUS
  Filled 2021-12-17 (×2): qty 0.25

## 2021-12-17 MED ORDER — FUROSEMIDE 20 MG PO TABS
20.0000 mg | ORAL_TABLET | Freq: Every day | ORAL | Status: DC
  Administered 2021-12-18: 20 mg via ORAL
  Filled 2021-12-17: qty 1

## 2021-12-17 MED ORDER — POTASSIUM CHLORIDE CRYS ER 20 MEQ PO TBCR
40.0000 meq | EXTENDED_RELEASE_TABLET | ORAL | Status: AC
  Administered 2021-12-17 (×2): 40 meq via ORAL
  Filled 2021-12-17 (×2): qty 2

## 2021-12-17 MED ORDER — ATORVASTATIN CALCIUM 10 MG PO TABS
20.0000 mg | ORAL_TABLET | Freq: Every day | ORAL | Status: DC
  Administered 2021-12-17 – 2021-12-18 (×2): 20 mg via ORAL
  Filled 2021-12-17 (×2): qty 2

## 2021-12-17 MED ORDER — LOSARTAN POTASSIUM 25 MG PO TABS
12.5000 mg | ORAL_TABLET | Freq: Every day | ORAL | Status: DC
  Administered 2021-12-17 – 2021-12-18 (×2): 12.5 mg via ORAL
  Filled 2021-12-17 (×2): qty 0.5

## 2021-12-17 NOTE — Plan of Care (Signed)

## 2021-12-17 NOTE — Progress Notes (Signed)
[facility] Progress Note  Patient: Beth Carroll     LZJ:[id number]  DOA: 12/09/2021   PCP: Baxter Hire, MD       Brief hospital course: This is a 51 year old female with diabetes mellitus, chronic systolic heart failure, peripheral artery disease, tobacco abuse who presented to the hospital after a fall at home followed by right knee pain.  The patient was found at home in her recliner incontinent of urine. In the ED at [facility], the patient was found to have a right knee fracture.  She was recommended to be transferred to [facility] for further treatment. When she arrived at [facility] on 11/15, she was noted to be hypothermic, hypotensive and lethargic. Work-up revealed metabolic acidosis, temperature of 91.5, SBP less than 90.  She was given fluid boluses but eventually required vasopressors. UA was consistent with a UTI.   She was also found to have DKA and AKI.   She was admitted to the critical care team and was able to be transferred out of the ICU to the [facility] on 11/19. On 11/20 she underwent an ORIF of right lateral femoral condyle fracture.  Subjective:  She is sitting up in bed and has no complaints.   Assessment and Plan: Principal Problem:   DKA, type 2 ([place]) -She is currently receiving Levemir-we will change this back to Lantus which is what she takes at home - Continue NovoLog with meals    Component Value Date/Time   HGBA1C 8.1 (H) 12/10/2021 0158    Active Problems:      Septic shock secondary to UTI [facility]) -She has completed a 7-day course of ceftriaxone    Acute metabolic encephalopathy -Related to above - Has resolved  Acute on chronic heart failure- resolved Hypertension - 2D echo in June 2023 revealed an EF of 40 to 45% and grade 2 diastolic dysfunction - She is felt to have become fluid overloaded due to resuscitation during septic shock - She required IV Lasix but has now been transitioned back to her home  dose of oral Lasix -Resume losartan  Hypokalemia   - Secondary to Lasix - Replace and follow  Right knee fracture - Secondary to a fall at home - Status post repair on 11/20 and now awaiting transition to skilled nursing facility  History of PAD  - 7/26> mechanical thrombectomy of right SFA, popliteal artery, tibial peroneal trunk, tibial artery and stent placement to the right popliteal artery and right SFA -Continue Eliquis and aspirin  Coronary artery disease - Last cath in 2022 revealed severe two-vessel coronary artery disease-the patient had angioplasty and DES stent placed in the mid LAD and proximal ramus  Normocytic anemia - Hemoglobin stable at 8-9 range    Code Status: Full Code DVT prophylaxis:  SCDs Start: 12/14/21 2016 apixaban (ELIQUIS) tablet 5 mg    Consultants: Orthopedic surgery Level of Care: Level of care: Telemetry Medical Total time on patient care: 35   Objective:   Vitals:   12/16/21 0756 12/16/21 1511 12/16/21 2011 12/17/21 0730  BP: (!) 141/85 135/69 127/71 134/75  Pulse: 91 86 90 77  Resp:   12 17  Temp: 98.9 F (37.2 C) 98.2 F (36.8 C) 98.6 F (37 C) 98.1 F (36.7 C)  TempSrc: Oral Oral Oral   SpO2: 95% 100% 100% 98%  Weight:      Height:       Filed Weights   12/11/21 0500 12/13/21 0500 12/14/21 0500  Weight: 56.1  kg 53.6 kg 52.9 kg   Exam: General exam: Appears comfortable  HEENT: oral mucosa moist Respiratory system: Clear to auscultation.  Cardiovascular system: S1 & S2 heard  Gastrointestinal system: Abdomen soft, non-tender, nondistended. Normal bowel sounds   Extremities: No cyanosis, clubbing or edema Psychiatry:  Mood & affect appropriate.      CBC: Recent Labs  Lab 12/13/21 0524 12/14/21 0510 12/15/21 0204 12/16/21 0433 12/17/21 0209  WBC 3.7* 3.9* 7.8 6.1 5.4  HGB 9.4* 8.9* 9.9* 8.2* 8.4*  HCT 28.3* 28.3* 31.5* 26.7* 25.6*  MCV 85.2 87.9 88.2 88.4 87.1  PLT 168 169 222 224 295   Basic Metabolic  Panel: Recent Labs  Lab 12/12/21 1824 12/13/21 0524 12/13/21 1437 12/14/21 0510 12/15/21 0204 12/16/21 0433 12/17/21 0209  NA  --  144  --  140 135 138 134*  K  --  3.6  --  3.6 3.8 3.0* 3.0*  CL  --  107  --  101 94* 101 92*  CO2  --  29  --  '31 24 31 30  '$ GLUCOSE  --  184*  --  174* 478* 177* 298*  BUN  --  16  --  '16 15 10 12  '$ CREATININE  --  0.36*  --  0.47 0.85 0.39* 0.47  CALCIUM  --  8.7*  --  8.8* 8.8* 8.8* 8.8*  MG 2.1 2.0 1.9 1.9  --   --  1.8  PHOS 2.4* 3.4 3.3 3.2  --   --  2.9   GFR: Estimated Creatinine Clearance: 68.7 mL/min (by C-G formula based on SCr of 0.47 mg/dL).  Scheduled Meds:  apixaban  5 mg Oral BID   aspirin EC  81 mg Oral Daily   atorvastatin  20 mg Oral Daily   Chlorhexidine Gluconate Cloth  6 each Topical Daily   docusate sodium  100 mg Oral BID   feeding supplement  237 mL Oral TID BM   folic acid  1 mg Oral Daily   [START ON 12/18/2021] furosemide  20 mg Oral Daily   insulin aspart  0-15 Units Subcutaneous TID WC   insulin aspart  0-5 Units Subcutaneous QHS   insulin aspart  4 Units Subcutaneous TID WC   insulin glargine-yfgn  25 Units Subcutaneous QHS   leptospermum manuka honey  1 Application Topical Daily   losartan  12.5 mg Oral Daily   multivitamin with minerals  1 tablet Oral Daily   nutrition supplement (JUVEN)  1 packet Oral BID BM   pantoprazole  40 mg Oral Daily   potassium chloride  40 mEq Oral Q4H   thiamine  100 mg Oral Daily   Continuous Infusions:  sodium chloride Stopped (12/10/21 0519)   methocarbamol (ROBAXIN) IV     Imaging and lab data was personally reviewed No results found.  LOS: 8 days   Author: Debbe Odea  12/17/2021 11:27 AM  To contact [facility]>   Check the care team in [facility] and look for the attending/consulting [place] provider listed  Log into [email or web address] and use [place]'s universal password   Go to> "[facility]"  and find provider  If you still have difficulty reaching the  provider, please page the [facility] (Director on Call) for the Hospitalists listed on amion

## 2021-12-18 ENCOUNTER — Other Ambulatory Visit (HOSPITAL_COMMUNITY): Payer: Self-pay

## 2021-12-18 LAB — BASIC METABOLIC PANEL
Anion gap: 10 (ref 5–15)
BUN: 6 mg/dL (ref 6–20)
CO2: 33 mmol/L — ABNORMAL HIGH (ref 22–32)
Calcium: 8.9 mg/dL (ref 8.9–10.3)
Chloride: 95 mmol/L — ABNORMAL LOW (ref 98–111)
Creatinine, Ser: 0.59 mg/dL (ref 0.44–1.00)
GFR, Estimated: >60 mL/min (ref >60)
Glucose, Bld: 318 mg/dL — ABNORMAL HIGH (ref 70–99)
Potassium: 3.6 mmol/L (ref 3.5–5.1)
Sodium: 138 mmol/L (ref 135–145)

## 2021-12-18 LAB — VITAMIN E
Vitamin E (Alpha Tocopherol): 6.5 mg/L — ABNORMAL LOW (ref 7.0–25.1)
Vitamin E(Gamma Tocopherol): 0.5 mg/L (ref 0.5–5.5)

## 2021-12-18 LAB — GLUCOSE, CAPILLARY
Glucose-Capillary: 246 mg/dL — ABNORMAL HIGH (ref 70–99)
Glucose-Capillary: 294 mg/dL — ABNORMAL HIGH (ref 70–99)

## 2021-12-18 LAB — VITAMIN A: Vitamin A (Retinoic Acid): 9 ug/dL — ABNORMAL LOW (ref 20.1–62.0)

## 2021-12-18 MED ORDER — ENSURE ENLIVE PO LIQD
237.0000 mL | Freq: Three times a day (TID) | ORAL | 12 refills | Status: DC
  Filled 2021-12-18: qty 237, 1d supply, fill #0

## 2021-12-18 MED ORDER — INSULIN GLARGINE-YFGN 100 UNIT/ML ~~LOC~~ SOLN
30.0000 [IU] | Freq: Every day | SUBCUTANEOUS | Status: DC
  Filled 2021-12-18: qty 0.3

## 2021-12-18 MED ORDER — JUVEN PO PACK
1.0000 | PACK | Freq: Two times a day (BID) | ORAL | 0 refills | Status: DC
  Filled 2021-12-18: qty 60, 30d supply, fill #0

## 2021-12-18 MED ORDER — OXYCODONE-ACETAMINOPHEN 5-325 MG PO TABS
1.0000 | ORAL_TABLET | ORAL | 0 refills | Status: DC | PRN
  Filled 2021-12-18: qty 30, 5d supply, fill #0

## 2021-12-18 MED ORDER — METHOCARBAMOL 500 MG PO TABS
500.0000 mg | ORAL_TABLET | Freq: Four times a day (QID) | ORAL | 0 refills | Status: DC | PRN
  Filled 2021-12-18: qty 30, 8d supply, fill #0

## 2021-12-18 NOTE — Progress Notes (Signed)
Orthopedic Tech Progress Note Patient Details:  Beth Carroll 1970/06/20 [id number]  Ortho Devices Type of Ortho Device: ASO Ortho Device/Splint Location: LLE Ortho Device/Splint Interventions: Ordered, Application, Adjustment   Post Interventions Patient Tolerated: Well Instructions Provided: Care of device, Adjustment of device  [place] A [name] 12/18/2021, 1:35 PM

## 2021-12-18 NOTE — Progress Notes (Addendum)
Call received from [facility] with [facility]. Meg informed NCM  they can't provide services until pt's insurance is verified and is in network. Latricia Heft states pt with new job.Pt states she has new insurance. Pt to f/u with [facility] with valid insurance information, providers made aware. [facility] to f/u with pt as well. Whitman Hero RN,BSN,CM

## 2021-12-18 NOTE — TOC Progression Note (Addendum)
Transition of Care [facility]) - Progression Note    Patient Details  Name: Beth Carroll MRN: [id number] Date of Birth: 1970/02/13  [facility] CM/SW Contact  Sharin Mons, RN Phone Number: 12/18/2021, 10:08 AM  Clinical Narrative:    Pt requesting hospital bed for home. Order received from MD. Pt with provider choice. Referral made with [facility] and accepted. Per [facility] hoping will be delivered to pt's home by tomorrow.  TOC team will continue to monitor and assist.   Expected Discharge Plan: [place] Barriers to Discharge: Continued Medical Work up  Expected Discharge Plan and Services Expected Discharge Plan: [place] In-house Referral: Clinical Social Work Discharge Planning Services: CM Consult Post Acute Care Choice: [place] arrangements for the past 2 months: [place]                 DME Arranged: Hospital bed DME Agency: [facility] Date DME Agency Contacted: 12/18/21 Time DME Agency Contacted: 1007 Representative spoke with at DME Agency: Jodell Cipro             Social Determinants of Health ([place]) Interventions    Readmission Risk Interventions     No data to display

## 2021-12-18 NOTE — Discharge Summary (Addendum)
Physician Discharge Summary  Beth Carroll JOA:[id number] DOB: 1970-08-30 DOA: 12/09/2021  PCP: Beth Hire, MD  Admit date: 12/09/2021 Discharge date: 12/18/2021    Consults:  Orthopedic surgery Procedures:  ORIF right knee   Discharge Diagnoses:   Principal Problem:   Septic shock due to Escherichia coli [facility]) Active Problems:   Traumatic closed fracture of femoral condyle with minimal displacement, unspecified laterality, initial encounter ([place])   Hypertension   Tobacco abuse   PAD (peripheral artery disease) (HCC)   Protein-calorie malnutrition, severe   DKA, type 2 (HCC)   Acute metabolic encephalopathy   Acute on chronic combined systolic and diastolic CHF (congestive heart failure) ([facility])   CAD S/P percutaneous coronary angioplasty     Hospital Course:  This is a 51 year old female with diabetes mellitus, chronic systolic heart failure, peripheral artery disease, tobacco abuse who presented to the hospital after a fall at home followed by right knee pain.  The patient was found at home in her recliner incontinent of urine. In the ED at [facility], the patient was found to have a right knee fracture.  She was recommended to be transferred to [facility] for further treatment. When she arrived at [facility] on 11/15, she was noted to be hypothermic, hypotensive and lethargic. Work-up revealed metabolic acidosis, temperature of 91.5, SBP less than 90.  She was given fluid boluses but eventually required vasopressors. UA was consistent with a UTI.   She was also found to have DKA and AKI.   She was admitted to the critical care team and was able to be transferred out of the ICU to the [facility] on 11/19. On 11/20 she underwent an ORIF of right lateral femoral condyle fracture.  Principal Problem:      Septic shock secondary to UTI [facility]) -She has completed a 7-day course of ceftriaxone   Active Problems:   Right knee fracture - Secondary to a  fall at home - Status post repair on 11/20 and stable- she has declined SNF    DKA, type 2 ([place]) -She is currently receiving Levemir-we will change this back to Lantus which is what she takes at home - Continue NovoLog with meals Labs (Brief)          Component Value Date/Time    HGBA1C 8.1 (H) 12/10/2021 0158       Acute metabolic encephalopathy -Related to above - Has resolved   Acute on chronic heart failure- resolved Hypertension - 2D echo in June 2023 revealed an EF of 40 to 45% and grade 2 diastolic dysfunction - She is felt to have become fluid overloaded due to resuscitation during septic shock - She required IV Lasix but has now been transitioned back to her home dose of oral Lasix -Resume losartan   Hypokalemia   - Secondary to Lasix - Replaced    History of PAD  - 7/26> mechanical thrombectomy of right SFA, popliteal artery, tibial peroneal trunk, tibial artery and stent placement to the right popliteal artery and right SFA -Continue Eliquis and aspirin   Coronary artery disease - Last cath in 2022 revealed severe two-vessel coronary artery disease-the patient had angioplasty and DES stent placed in the mid LAD and proximal ramus   Normocytic anemia - Hemoglobin stable at 8-9 range  Underweight/ Malnourished - cont dietary supplements      Discharge Instructions  Discharge Instructions     Diet - low sodium heart healthy   Complete by: As directed  Discharge wound care:   Complete by: As directed    Wound care  Daily Comments: 1. Apply Medihoney to right buttock Q day, then cover with foam dressing.  Change foam dressing Q 3 days or PRN soiling 2. Foam dressing to bilat heels, change Q 3 days or PRN soiling   Increase activity slowly   Complete by: As directed       Allergies as of 12/18/2021   No Known Allergies      Medication List     TAKE these medications    acetaminophen 650 MG CR tablet Commonly known as: TYLENOL Take 650 mg  by mouth every 8 (eight) hours as needed for pain.   albuterol 108 (90 Base) MCG/ACT inhaler Commonly known as: VENTOLIN HFA Inhale 2 puffs into the lungs every 6 (six) hours as needed for wheezing or shortness of breath.   aspirin EC 81 MG tablet Take 81 mg by mouth daily. Swallow whole.   atorvastatin 20 MG tablet Commonly known as: LIPITOR Take 1 tablet (20 mg total) by mouth daily.   B12 5000 MCG Subl Place 5,000 mcg under the tongue daily.   carvedilol 3.125 MG tablet Commonly known as: COREG Take 1 tablet (3.125 mg total) by mouth 2 (two) times daily with a meal.   CENTRUM SILVER 50+WOMEN PO Take 1 tablet by mouth daily.   clotrimazole 1 % cream Commonly known as: LOTRIMIN Apply 1 Application topically 2 (two) times daily.   Eliquis 5 MG Tabs tablet Generic drug: apixaban TAKE 1 TABLET BY MOUTH TWICE A DAY   Farxiga 10 MG Tabs tablet Generic drug: dapagliflozin propanediol TAKE 1 TABLET BY MOUTH EVERY DAY BEFORE BREAKFAST What changed: See the new instructions.   feeding supplement Liqd Take 237 mLs by mouth 3 (three) times daily between meals.   nutrition supplement (JUVEN) Pack Take 1 packet by mouth 2 (two) times daily between meals.   furosemide 20 MG tablet Commonly known as: LASIX Take 20 mg by mouth daily.   hydrochlorothiazide 25 MG tablet Commonly known as: HYDRODIURIL Take 25 mg by mouth daily.   insulin aspart 100 UNIT/ML FlexPen Commonly known as: NOVOLOG Inject 6-7 Units into the skin 3 (three) times daily with meals.   Lantus SoloStar 100 UNIT/ML Solostar Pen Generic drug: insulin glargine Inject 25 Units into the skin daily.   loperamide 2 MG tablet Commonly known as: IMODIUM A-D Take 2 mg by mouth 4 (four) times daily as needed for diarrhea or loose stools.   losartan 25 MG tablet Commonly known as: COZAAR TAKE 1/2 TABLET BY MOUTH EVERY DAY   methocarbamol 500 MG tablet Commonly known as: ROBAXIN Take 1 tablet (500 mg total) by  mouth every 6 (six) hours as needed for muscle spasms.   pantoprazole 40 MG tablet Commonly known as: PROTONIX Take 1 tablet (40 mg total) by mouth daily.   Semaglutide(0.25 or 0.'5MG'$ /DOS) 2 MG/3ML Sopn Inject into the skin.   tiotropium 18 MCG inhalation capsule Commonly known as: SPIRIVA Place 1 capsule into inhaler and inhale daily.   traMADol 50 MG tablet Commonly known as: ULTRAM Take 1 tablet (50 mg total) by mouth every 6 (six) hours as needed for severe pain.   Vitamin D (Ergocalciferol) 1.25 MG (50000 UNIT) Caps capsule Commonly known as: DRISDOL Take 50,000 Units by mouth once a week.               Durable Medical Equipment  (From admission, onward)  Start     Ordered   12/18/21 1005  For home use only DME Hospital bed  Once       Question Answer Comment  Length of Need 6 Months   Patient has (list medical condition): s/p Open reduction internal fixation of right lateral femoral condyle fracture   The above medical condition requires: Patient requires the ability to reposition frequently   Head must be elevated greater than: 30 degrees   Bed type Semi-electric   Support Surface: Gel Overlay      12/18/21 1006              Discharge Care Instructions  (From admission, onward)           Start     Ordered   12/18/21 0000  Discharge wound care:       Comments: Wound care  Daily Comments: 1. Apply Medihoney to right buttock Q day, then cover with foam dressing.  Change foam dressing Q 3 days or PRN soiling 2. Foam dressing to bilat heels, change Q 3 days or PRN soiling   12/18/21 1142            Follow-up Information     Haddix, Thomasene Lot, MD. Schedule an appointment as soon as possible for a visit in 2 week(s).   Specialty: Orthopedic Surgery Why: for wound check, suture removal, repeat x-rays Contact information: [place] [address] [phone]         Beth Hire, MD Follow up.   Specialty:  Internal Medicine Contact information: [place] [address] [phone]                    The results of significant diagnostics from this hospitalization (including imaging, microbiology, ancillary and laboratory) are listed below for reference.    DG Knee 1-2 Views Right  Result Date: 12/14/2021 CLINICAL DATA:  Open reduction internal fixation of right distal femoral fracture. EXAM: RIGHT KNEE - 1-2 VIEW COMPARISON:  12/08/2021. FINDINGS: Eight fluoroscopic images were obtained intraoperatively. Total fluoroscopy time is 55 seconds. Dose: 2.15 mGy. There has been interval placement of fixation hardware in the distal right femur 4 lateral condyle fracture. Please see operative report for additional information. IMPRESSION: Intraoperative utilization of fluoroscopy. Electronically Signed   By: Brett Fairy M.D.   On: 12/14/2021 20:22   DG C-Arm 1-60 Min-No Report  Result Date: 12/14/2021 Fluoroscopy was utilized by the requesting physician.  No radiographic interpretation.   DG C-Arm 1-60 Min-No Report  Result Date: 12/14/2021 Fluoroscopy was utilized by the requesting physician.  No radiographic interpretation.   DG Knee Right Port  Result Date: 12/14/2021 CLINICAL DATA:  Fracture, postop. EXAM: PORTABLE RIGHT KNEE - 1-2 VIEW COMPARISON:  Preoperative imaging. FINDINGS: Lateral plate and multi screw fixation of distal femur fracture. Improved fracture alignment from preoperative exam. Recent postsurgical change includes air and edema in the joint space and soft tissues. Extensive vascular stent. The bones are under mineralized IMPRESSION: ORIF of distal femur fracture with improved fracture alignment from preoperative exam. Electronically Signed   By: Keith Rake M.D.   On: 12/14/2021 19:40   DG Abd Portable 1V  Result Date: 12/11/2021 CLINICAL DATA:  Feeding tube placement EXAM: PORTABLE ABDOMEN - 1 VIEW COMPARISON:  None Available. FINDINGS:  Feeding tube tip is in the distal stomach. Visualized abdomen and pelvis with nonobstructive bowel-gas pattern visualized lung bases are clear. IMPRESSION: Feeding tube tip is in the  distal stomach. Electronically Signed   By: Yetta Glassman M.D.   On: 12/11/2021 16:09   VAS [place] LOWER EXTREMITY VENOUS (DVT)  Result Date: 12/11/2021  Lower Venous DVT Study Patient Name:  Beth STANKE  Date of Exam:   12/10/2021 Medical Rec #: [id number]      Accession #:    [id number] Date of Birth: April 17, 1970      Patient Gender: F Patient Age:   39 years Exam Location:  [facility] Procedure:      VAS [place] LOWER EXTREMITY VENOUS (DVT) Referring Phys: MURALI RAMASWAMY --------------------------------------------------------------------------------  Limitations: Patient positioning & right leg bandaging and splint. Comparison Study: no prior Performing Technologist: Archie Patten RVS  Examination Guidelines: A complete evaluation includes B-mode imaging, spectral Doppler, color Doppler, and power Doppler as needed of all accessible portions of each vessel. Bilateral testing is considered an integral part of a complete examination. Limited examinations for reoccurring indications may be performed as noted. The reflux portion of the exam is performed with the patient in reverse Trendelenburg.  +---------+---------------+---------+-----------+----------+-------------------+ RIGHT    CompressibilityPhasicitySpontaneityPropertiesThrombus Aging      +---------+---------------+---------+-----------+----------+-------------------+ CFV      Full           Yes      Yes                                      +---------+---------------+---------+-----------+----------+-------------------+ SFJ      Full                                                             +---------+---------------+---------+-----------+----------+-------------------+ FV Prox  Full                                                              +---------+---------------+---------+-----------+----------+-------------------+ FV Mid   Full                                                             +---------+---------------+---------+-----------+----------+-------------------+ FV Distal                                             Not well visualized +---------+---------------+---------+-----------+----------+-------------------+ PFV                                                   Not well visualized +---------+---------------+---------+-----------+----------+-------------------+ POP  Not well visualized +---------+---------------+---------+-----------+----------+-------------------+ PTV      Full                                                             +---------+---------------+---------+-----------+----------+-------------------+ PERO     Full                                                             +---------+---------------+---------+-----------+----------+-------------------+   +---------+---------------+---------+-----------+----------+-------------------+ LEFT     CompressibilityPhasicitySpontaneityPropertiesThrombus Aging      +---------+---------------+---------+-----------+----------+-------------------+ CFV      Full           Yes      Yes                                      +---------+---------------+---------+-----------+----------+-------------------+ SFJ      Full                                                             +---------+---------------+---------+-----------+----------+-------------------+ FV Prox  Full                                                             +---------+---------------+---------+-----------+----------+-------------------+ FV Mid   Full                                                              +---------+---------------+---------+-----------+----------+-------------------+ FV DistalFull                                                             +---------+---------------+---------+-----------+----------+-------------------+ PFV      Full                                                             +---------+---------------+---------+-----------+----------+-------------------+ POP      Full           Yes      Yes                                      +---------+---------------+---------+-----------+----------+-------------------+  PTV      Full                                                             +---------+---------------+---------+-----------+----------+-------------------+ PERO                                                  Not well visualized +---------+---------------+---------+-----------+----------+-------------------+     Summary: RIGHT: - There is no evidence of deep vein thrombosis in the lower extremity. However, portions of this examination were limited- see technologist comments above.  - No cystic structure found in the popliteal fossa.  LEFT: - There is no evidence of deep vein thrombosis in the lower extremity.  - No cystic structure found in the popliteal fossa.  *See table(s) above for measurements and observations. Electronically signed by Deitra Mayo MD on 12/11/2021 at 3:03:23 PM.    Final    DG CHEST PORT 1 VIEW  Result Date: 12/10/2021 CLINICAL DATA:  Altered mental status EXAM: PORTABLE CHEST 1 VIEW COMPARISON:  None Available. FINDINGS: The heart size and mediastinal contours are within normal limits. Both lungs are clear. The visualized skeletal structures are unremarkable. IMPRESSION: No active disease. Electronically Signed   By: Fidela Salisbury M.D.   On: 12/10/2021 01:11   DG Chest 2 View  Result Date: 12/08/2021 CLINICAL DATA:  Cough EXAM: CHEST - 2 VIEW COMPARISON:  12/09/2020 FINDINGS: Possible streaky developing  infiltrate at left base. No consolidation. Normal cardiac size. No pneumothorax. Aortic atherosclerosis IMPRESSION: Possible streaky developing infiltrate at the left base. Electronically Signed   By: Donavan Foil M.D.   On: 12/08/2021 21:21   CT Knee Right Wo Contrast  Result Date: 12/08/2021 CLINICAL DATA:  [place] 6 days ago.  Evaluate left neck fractures. EXAM: CT OF THE RIGHT KNEE WITHOUT CONTRAST TECHNIQUE: Multidetector CT imaging of the right knee was performed according to the standard protocol. Multiplanar CT image reconstructions were also generated. RADIATION DOSE REDUCTION: This exam was performed according to the departmental dose-optimization program which includes automated exposure control, adjustment of the mA and/or kV according to patient size and/or use of iterative reconstruction technique. COMPARISON:  Radiographs, same date. FINDINGS: Complex comminuted intra-articular fracture involving the lateral femoral condyle. Early bony resorptive changes along the fracture site. Maximum step-off at the articular surfaces 8 mm. The tibia, fibula and patella are intact. There is significant age advanced osteoporosis. Large lipohemarthrosis is noted. Grossly by CT the cruciate and collateral ligaments are intact. The quadriceps and patellar tendons are intact. Popliteal artery graft noted. IMPRESSION: 1. Complex comminuted intra-articular fracture involving the lateral femoral condyle. Maximum step-off at the articular surfaces 8 mm. 2. Large lipohemarthrosis. 3. Age advanced osteoporosis. 4. Grossly by CT the cruciate and collateral ligaments are intact. Electronically Signed   By: Marijo Sanes M.D.   On: 12/08/2021 14:58   DG Knee 2 Views Right  Result Date: 12/08/2021 CLINICAL DATA:  Fall 6 days ago.  Pain. EXAM: RIGHT KNEE - 1-2 VIEW COMPARISON:  None Available. FINDINGS: There is diffuse decreased bone mineralization. There is cortical step-off of the mid and lateral aspect of a  weight-bearing lateral femoral condyle  with the lateral portion displaced approximately 7 mm superior to the medial portion on frontal view. There is also similar superior cortical step-off of the posterosuperior aspect of the lateral femoral condyle on lateral view. Findings are suspicious for a displaced fracture. Moderate joint effusion with possible nondependent fat indicating a lipohemarthrosis from the acute fracture. Mild medial joint space narrowing and peripheral osteophytosis. Multiple contiguous vascular stents are seen within the superficial femoral artery through the popliteal artery. Moderate vascular calcifications. IMPRESSION: 1. Acute displaced fracture of the weight-bearing lateral femoral condyle with superior displacement of the lateral aspect of the lateral femoral condyle. 2. Moderate joint effusion with possible nondependent fat indicating a lipohemarthrosis related to the acute fracture. Electronically Signed   By: Yvonne Kendall M.D.   On: 12/08/2021 13:36   Labs:   Basic Metabolic Panel: Recent Labs  Lab 12/12/21 1824 12/13/21 0524 12/13/21 1437 12/14/21 0510 12/15/21 0204 12/16/21 0433 12/17/21 0209 12/18/21 0253  NA  --  144  --  140 135 138 134* 138  K  --  3.6  --  3.6 3.8 3.0* 3.0* 3.6  CL  --  107  --  101 94* 101 92* 95*  CO2  --  29  --  '31 24 31 30 '$ 33*  GLUCOSE  --  184*  --  174* 478* 177* 298* 318*  BUN  --  16  --  '16 15 10 12 6  '$ CREATININE  --  0.36*  --  0.47 0.85 0.39* 0.47 0.59  CALCIUM  --  8.7*  --  8.8* 8.8* 8.8* 8.8* 8.9  MG 2.1 2.0 1.9 1.9  --   --  1.8  --   PHOS 2.4* 3.4 3.3 3.2  --   --  2.9  --      CBC: Recent Labs  Lab 12/13/21 0524 12/14/21 0510 12/15/21 0204 12/16/21 0433 12/17/21 0209  WBC 3.7* 3.9* 7.8 6.1 5.4  HGB 9.4* 8.9* 9.9* 8.2* 8.4*  HCT 28.3* 28.3* 31.5* 26.7* 25.6*  MCV 85.2 87.9 88.2 88.4 87.1  PLT 168 169 222 224 232         SIGNED:   Debbe Odea, MD  [facility] 12/18/2021, 12:59 PM

## 2021-12-18 NOTE — Progress Notes (Signed)
Orthopedic Tech Progress Note Patient Details:  Beth Carroll 1970-04-12 [id number]  Patient ID: Beth Carroll, female   DOB: 01-16-1971, 51 y.o.   MRN: [id number] Reached out to RN via secure chat regarding patients shoe size, so I can bring the correct size ASO. Awaiting response. Vernona Rieger 12/18/2021, 12:49 PM

## 2021-12-18 NOTE — Progress Notes (Signed)
Occupational Therapy Treatment Patient Details Name: Beth Carroll MRN: [id number] DOB: 12-13-1970 Today's Date: 12/18/2021   History of present illness Pt is a 51 y/o female who presented after a fall (3 days prior to presentation to ED) resulting in R intra-articular fx of R lateral femoral condyle. Pt admitted to ICU for septic shock due to UTI. Pt underwent ORIF of R LE on 11/20. PMH: DM2, CAD, PAD, GERD, B heel pressure wounds.   OT comments  Patient received in supine and motivated to participate with OT/PT. Patient's bed was wet when assisting to EOB and performed gown change and changed socks. Patient performed 3 stands from EOB and 3rd stand performed peri area back cleaning with daughter on right side to assist with balance and PT assisting with LLE ankle. Patient and daughter educated on lateral scoot transfer with use of bed pad with good participation from patient. Patient expressed desire to go home over SNF and has good family support. Discharge recommendations changed to [place].    Recommendations for follow up therapy are one component of a multi-disciplinary discharge planning process, led by the attending physician.  Recommendations may be updated based on patient status, additional functional criteria and insurance authorization.    Follow Up Recommendations  Home health OT     Assistance Recommended at Discharge Frequent or constant Supervision/Assistance  Patient can return home with the following  Two people to help with walking and/or transfers;A lot of help with bathing/dressing/bathroom   Equipment Recommendations  Hospital bed    Recommendations for Other Services      Precautions / Restrictions Precautions Precautions: Fall;Other (comment) Precaution Comments: monitor BP, B heel wounds Restrictions Weight Bearing Restrictions: Yes RLE Weight Bearing: Non weight bearing       Mobility Bed Mobility Overal bed mobility: Needs Assistance Bed Mobility:  Supine to Sit     Supine to sit: Mod assist, +2 for physical assistance     General bed mobility comments: pt needed assist with moving RLE, tolerating mobility better today than last session    Transfers Overall transfer level: Needs assistance Equipment used: Rolling walker (2 wheels) Transfers: Bed to chair/wheelchair/BSC, Sit to/from Stand Sit to Stand: Max assist, +2 physical assistance          Lateral/Scoot Transfers: +2 physical assistance, Mod assist General transfer comment: Patient able to stand x3 with RW from EOB with assistance for LLE ankle due to ankle inverting. Patient stood third time to clean peri area back.  Bed pad used to assist with lateral scoot transfer to recliner     Balance Overall balance assessment: Needs assistance Sitting-balance support: No upper extremity supported, Feet supported Sitting balance-Leahy Scale: Fair Sitting balance - Comments: min guard for static sitting on EOB   Standing balance support: Bilateral upper extremity supported, During functional activity, Reliant on assistive device for balance Standing balance-Leahy Scale: Zero Standing balance comment: UE support and max A needed                           ADL either performed or assessed with clinical judgement   ADL Overall ADL's : Needs assistance/impaired             Lower Body Bathing: Maximal assistance;+2 for physical assistance;Sit to/from stand Lower Body Bathing Details (indicate cue type and reason): peri care back cleanting while standing Upper Body Dressing : Set up;Sitting Upper Body Dressing Details (indicate cue type and reason): changed gown Lower Body  Dressing: Maximal assistance Lower Body Dressing Details (indicate cue type and reason): to donn socks                    Extremity/Trunk Assessment              Vision       Perception     Praxis      Cognition Arousal/Alertness: Awake/alert Behavior During Therapy: WFL  for tasks assessed/performed, Anxious Overall Cognitive Status: Within Functional Limits for tasks assessed                                 General Comments: anxious        Exercises      Shoulder Instructions       General Comments daughter present and educated on how to assist her mom as well as on L ankle exercises for strengthening everters    Pertinent Vitals/ Pain       Pain Assessment Pain Assessment: Faces Faces Pain Scale: Hurts even more Pain Location: R LE, from foot to hip, pt states worse at knee Pain Descriptors / Indicators: Grimacing, Guarding, Sharp, Tender Pain Intervention(s): Limited activity within patient's tolerance, Monitored during session, Repositioned  Home Living                                          Prior Functioning/Environment              Frequency  Min 2X/week        Progress Toward Goals  OT Goals(current goals can now be found in the care plan section)  Progress towards OT goals: Progressing toward goals  Acute Rehab OT Goals Patient Stated Goal: go home OT Goal Formulation: With patient Time For Goal Achievement: 12/29/21 Potential to Achieve Goals: Good ADL Goals Pt Will Perform Lower Body Bathing: with min assist;sitting/lateral leans;sit to/from stand;with adaptive equipment Pt Will Perform Lower Body Dressing: with min assist;sitting/lateral leans;sit to/from stand Pt Will Transfer to Toilet: with mod assist;stand pivot transfer;squat pivot transfer;bedside commode Pt Will Perform Toileting - Clothing Manipulation and hygiene: with min assist;sitting/lateral leans;sit to/from stand  Plan Discharge plan remains appropriate    Co-evaluation    PT/OT/SLP Co-Evaluation/Treatment: Yes Reason for Co-Treatment: Complexity of the patient's impairments (multi-system involvement);For patient/therapist safety;To address functional/ADL transfers PT goals addressed during session:  Mobility/safety with mobility;Proper use of DME;Balance;Strengthening/ROM OT goals addressed during session: ADL's and self-care      AM-PAC OT "6 Clicks" Daily Activity     Outcome Measure   Help from another person eating meals?: None Help from another person taking care of personal grooming?: A Little Help from another person toileting, which includes using toliet, bedpan, or urinal?: Total Help from another person bathing (including washing, rinsing, drying)?: A Lot Help from another person to put on and taking off regular upper body clothing?: A Little Help from another person to put on and taking off regular lower body clothing?: A Lot 6 Click Score: 15    End of Session Equipment Utilized During Treatment: Gait belt;Rolling walker (2 wheels)  OT Visit Diagnosis: Unsteadiness on feet (R26.81);Other abnormalities of gait and mobility (R26.89)   Activity Tolerance Patient tolerated treatment well   Patient Left in chair;with call bell/phone within reach;with family/visitor present   Nurse Communication Mobility status  Time: 3917-9217 OT Time Calculation (min): 44 min  Charges: OT General Charges $OT Visit: 1 Visit OT Treatments $Self Care/Home Management : 8-22 mins  Lodema Hong, [place]  Office [place] 12/18/2021, 12:57 PM

## 2021-12-18 NOTE — Progress Notes (Signed)
Physical Therapy Treatment Patient Details Name: Beth Carroll MRN: [id number] DOB: [birth year] Today's Date: 12/18/2021   History of Present Illness Pt is a 51 y/o female who presented after a fall (3 days prior to presentation to ED) resulting in R intra-articular fx of R lateral femoral condyle. Pt admitted to ICU for septic shock due to UTI. Pt underwent ORIF of R LE on 11/20. PMH: DM2, CAD, PAD, GERD, B heel pressure wounds.    PT Comments    Pt making progress and daughter present for education on transfers. Pt has w/c and RW at home. Will need hospital bed and hoyer if possible since pt fatigues when up and has more trouble going back to bed than getting out. Working on progressing standing but pt has old ankle injury and L ankle inverts maximally in standing when not supported by PT. Will need ASO L ankle to be able to safely stand on L and not put wt on R. WOrked on sit>stand 3 times and performed scoot transfer with bed pad from bed to recliner. Pt could go home if she can get HHPT there. If she can, that is her preference. If not, continue to recommend SNF for rehab. PT will continue to follow.    Recommendations for follow up therapy are one component of a multi-disciplinary discharge planning process, led by the attending physician.  Recommendations may be updated based on patient status, additional functional criteria and insurance authorization.  Follow Up Recommendations  Home health PT (as long as insurnace will cover HHPT) Can patient physically be transported by private vehicle: No   Assistance Recommended at Discharge Frequent or constant Supervision/Assistance  Patient can return home with the following Two people to help with walking and/or transfers;Two people to help with bathing/dressing/bathroom;Help with stairs or ramp for entrance;Assist for transportation;Assistance with cooking/housework   Equipment Recommendations  Other (comment);Hospital bed (mechanical lift)     Recommendations for Other Services       Precautions / Restrictions Precautions Precautions: Fall;Other (comment) Precaution Comments: monitor BP, B heel wounds Restrictions Weight Bearing Restrictions: Yes RLE Weight Bearing: Non weight bearing     Mobility  Bed Mobility Overal bed mobility: Needs Assistance Bed Mobility: Supine to Sit     Supine to sit: Mod assist, +2 for physical assistance     General bed mobility comments: pt needed assist with moving RLE, tolerating mobility better today than last session    Transfers Overall transfer level: Needs assistance Equipment used: Rolling walker (2 wheels) Transfers: Bed to chair/wheelchair/BSC, Sit to/from Stand Sit to Stand: Max assist, +2 physical assistance          Lateral/Scoot Transfers: +2 physical assistance, Mod assist General transfer comment: pt stood to RW 3x for strengthening and practice to eventually be able to use RW for SPT. With first stand pt's L ankle inverting maximally and pt unable to correct. Pt endorses old L ankle injury. With subsequent stands, PT supported L ankle to prevent inversion and pt tolerated standing. Will need an ankle brace to progress standing mobility. Performed scoot pivot to L using bed pad as a sling. Pt able to use UE's to scoot, mod A needed.    Ambulation/Gait               General Gait Details: unable   Stairs             Wheelchair Mobility    Modified Rankin (Stroke Patients Only)       Balance Overall  balance assessment: Needs assistance Sitting-balance support: No upper extremity supported, Feet supported Sitting balance-Leahy Scale: Fair Sitting balance - Comments: able to weight shift and static sit unsupported, min guard for safety with lateral leans/elbow taps   Standing balance support: Bilateral upper extremity supported, During functional activity, Reliant on assistive device for balance Standing balance-Leahy Scale: Zero Standing  balance comment: UE support and max A needed                            Cognition Arousal/Alertness: Awake/alert Behavior During Therapy: WFL for tasks assessed/performed, Anxious Overall Cognitive Status: Within Functional Limits for tasks assessed                                 General Comments: anxious        Exercises Other Exercises Other Exercises: L ankle eversion x10 in sitting    General Comments General comments (skin integrity, edema, etc.): daughter present and educated on how to assist her mom as well as on L ankle exercises for strengthening everters      Pertinent Vitals/Pain Pain Assessment Pain Assessment: Faces Faces Pain Scale: Hurts even more Pain Location: R LE, from foot to hip, pt states worse at knee Pain Descriptors / Indicators: Grimacing, Guarding, Sharp, Tender Pain Intervention(s): Limited activity within patient's tolerance, Monitored during session    Home Living                          Prior Function            PT Goals (current goals can now be found in the care plan section) Acute Rehab PT Goals Patient Stated Goal: home PT Goal Formulation: With patient Time For Goal Achievement: 12/29/21 Potential to Achieve Goals: Good Progress towards PT goals: Progressing toward goals    Frequency    Min 3X/week      PT Plan Discharge plan needs to be updated    Co-evaluation PT/OT/SLP Co-Evaluation/Treatment: Yes Reason for Co-Treatment: Complexity of the patient's impairments (multi-system involvement);For patient/therapist safety PT goals addressed during session: Mobility/safety with mobility;Proper use of DME;Balance;Strengthening/ROM        AM-PAC PT "6 Clicks" Mobility   Outcome Measure  Help needed turning from your back to your side while in a flat bed without using bedrails?: A Little Help needed moving from lying on your back to sitting on the side of a flat bed without using  bedrails?: A Little Help needed moving to and from a bed to a chair (including a wheelchair)?: A Lot Help needed standing up from a chair using your arms (e.g., wheelchair or bedside chair)?: Total Help needed to walk in hospital room?: Total Help needed climbing 3-5 steps with a railing? : Total 6 Click Score: 11    End of Session Equipment Utilized During Treatment: Gait belt Activity Tolerance: Patient tolerated treatment well Patient left: in chair;with call bell/phone within reach;with family/visitor present;Other (comment) (geomat cushion in chair for pressure relief) Nurse Communication: Mobility status;Need for lift equipment PT Visit Diagnosis: Other abnormalities of gait and mobility (R26.89);Pain Pain - Right/Left: Right Pain - part of body: Leg     Time: 6010-9323 PT Time Calculation (min) (ACUTE ONLY): 44 min  Charges:  $Gait Training: 8-22 mins $Therapeutic Activity: 8-22 mins  Leighton Roach, PT  Acute Rehab Services Secure chat preferred Office [place] 12/18/2021, 11:38 AM

## 2021-12-18 NOTE — TOC Transition Note (Signed)
Transition of Care [facility]) - CM/SW Discharge Note   Patient Details  Name: Beth Carroll MRN: [id number] Date of Birth: 07/04/1970  [facility] CM/SW Contact:  Sharin Mons, RN Phone Number: 12/18/2021, 1:31 PM   Clinical Narrative:    Patient will DC to: home Anticipated DC date: 12/18/2021 Family notified: yes Transport by: Corey Harold       - S/p Open reduction internal fixation of the right lateral femoral condyle fracture ,11/20 Per MD patient ready for DC today. RN, patient, patient's family, and Meg with [facility] notified of DC. Meg made NCM pt currently not active with however will accept. Pt agreeable to home health services. Preference: [facility]. Hospital bed to be delivered to pt  home today per [employer]. Transportation  forms will be placed on front of  the chart by the nurse.PTAR/ Ambulance transport requested for patient. Pt without Rx med concerns. Post hospital f/u noted on AVS.  RNCM will sign off for now as intervention is no longer needed. Please consult [place] again if new needs arise.    Final next level of care: [place] Barriers to Discharge: No Barriers Identified   Patient Goals and CMS Choice Patient states their goals for this hospitalization and ongoing recovery are:: get back to regular routine   Choice offered to / list presented to : Patient  Discharge Placement                       Discharge Plan and Services In-house Referral: Clinical Social Work Discharge Planning Services: CM Consult Post Acute Care Choice: Home Health          DME Arranged: Hospital bed DME Agency: [employer] Date DME Agency Contacted: 12/18/21 Time DME Agency Contacted: 0071 Representative spoke with at DME Agency: Jodell Cipro HH Arranged: RN, PT, OT [facility] Agency: [place] Date [place]: 12/18/21 Time [place]: [phone] Representative spoke with at [place]: Meg  Social Determinants of Health ([place])  Interventions     Readmission Risk Interventions     No data to display

## 2021-12-18 NOTE — Progress Notes (Cosign Needed)
    Durable Medical Equipment  (From admission, onward)           Start     Ordered   12/18/21 1005  For home use only DME Hospital bed  Once       Question Answer Comment  Length of Need 6 Months   Patient has (list medical condition): s/p Open reduction internal fixation of right lateral femoral condyle fracture   The above medical condition requires: Patient requires the ability to reposition frequently   Head must be elevated greater than: 30 degrees   Bed type Semi-electric   Support Surface: Gel Overlay      12/18/21 1006

## 2021-12-22 LAB — MISC LABCORP TEST (SEND OUT): Labcorp test code: 4655

## 2021-12-23 LAB — ZINC: Zinc: 60 ug/dL (ref 44–115)

## 2021-12-23 LAB — COPPER, SERUM: Copper: 105 ug/dL (ref 80–158)

## 2021-12-29 ENCOUNTER — Telehealth: Payer: Self-pay

## 2021-12-29 ENCOUNTER — Ambulatory Visit: Payer: No Typology Code available for payment source | Admitting: Podiatry

## 2021-12-29 NOTE — Telephone Encounter (Signed)
[email or web address]  Prior Authorization for [employer] (Key: [id number]) Rx #: [id number] Farxiga '10MG'$  tablets sent through [email or web address] Awaiting for approval.

## 2021-12-30 ENCOUNTER — Other Ambulatory Visit: Payer: Self-pay

## 2021-12-30 NOTE — Telephone Encounter (Signed)
Received a fax approval for Farxiga 10 mg tablet quantity of 90 tablets. Approved for 12 months 12/29/2021 to 12/30/2022.    Pharmacist notified of approval for Farxiga 10 mg.

## 2021-12-30 NOTE — Progress Notes (Signed)
ERROR

## 2022-01-03 NOTE — Progress Notes (Deleted)
NO SHOW

## 2022-01-04 ENCOUNTER — Ambulatory Visit: Payer: Self-pay | Attending: Cardiovascular Disease | Admitting: Cardiovascular Disease

## 2022-01-04 DIAGNOSIS — I255 Ischemic cardiomyopathy: Secondary | ICD-10-CM

## 2022-01-04 DIAGNOSIS — E782 Mixed hyperlipidemia: Secondary | ICD-10-CM

## 2022-01-04 DIAGNOSIS — I2510 Atherosclerotic heart disease of native coronary artery without angina pectoris: Secondary | ICD-10-CM

## 2022-01-04 DIAGNOSIS — I10 Essential (primary) hypertension: Secondary | ICD-10-CM

## 2022-01-04 DIAGNOSIS — I5022 Chronic systolic (congestive) heart failure: Secondary | ICD-10-CM

## 2022-01-04 DIAGNOSIS — I739 Peripheral vascular disease, unspecified: Secondary | ICD-10-CM

## 2022-01-05 ENCOUNTER — Encounter: Payer: Self-pay | Admitting: Cardiovascular Disease

## 2022-02-01 ENCOUNTER — Other Ambulatory Visit (INDEPENDENT_AMBULATORY_CARE_PROVIDER_SITE_OTHER): Payer: Self-pay | Admitting: Nurse Practitioner

## 2022-02-01 DIAGNOSIS — Z9889 Other specified postprocedural states: Secondary | ICD-10-CM

## 2022-02-02 ENCOUNTER — Ambulatory Visit (INDEPENDENT_AMBULATORY_CARE_PROVIDER_SITE_OTHER): Payer: Medicaid Other | Admitting: Vascular Surgery

## 2022-02-02 ENCOUNTER — Encounter (INDEPENDENT_AMBULATORY_CARE_PROVIDER_SITE_OTHER): Payer: Self-pay

## 2022-02-02 ENCOUNTER — Ambulatory Visit (INDEPENDENT_AMBULATORY_CARE_PROVIDER_SITE_OTHER): Payer: Medicaid Other

## 2022-02-02 ENCOUNTER — Encounter (INDEPENDENT_AMBULATORY_CARE_PROVIDER_SITE_OTHER): Payer: Self-pay | Admitting: Vascular Surgery

## 2022-02-02 VITALS — BP 97/68 | HR 75 | Resp 15

## 2022-02-02 DIAGNOSIS — E118 Type 2 diabetes mellitus with unspecified complications: Secondary | ICD-10-CM | POA: Diagnosis not present

## 2022-02-02 DIAGNOSIS — I10 Essential (primary) hypertension: Secondary | ICD-10-CM | POA: Diagnosis not present

## 2022-02-02 DIAGNOSIS — Z9889 Other specified postprocedural states: Secondary | ICD-10-CM | POA: Diagnosis not present

## 2022-02-02 DIAGNOSIS — I70229 Atherosclerosis of native arteries of extremities with rest pain, unspecified extremity: Secondary | ICD-10-CM

## 2022-02-02 DIAGNOSIS — I739 Peripheral vascular disease, unspecified: Secondary | ICD-10-CM

## 2022-02-02 DIAGNOSIS — I255 Ischemic cardiomyopathy: Secondary | ICD-10-CM | POA: Diagnosis not present

## 2022-02-02 DIAGNOSIS — Z794 Long term (current) use of insulin: Secondary | ICD-10-CM

## 2022-02-02 NOTE — Assessment & Plan Note (Signed)
Noninvasive studies today show normal arterial flow bilaterally with ABIs as of 1.17 on the right and 1.12 on the left with biphasic waveforms with digit pressures of 109 on the right and 113 on the left.  Perfusion is currently doing well.  Wounds have markedly improved.  Overall doing good from a vascular standpoint.  Continue current medical regimen.  Recheck in 6 months with noninvasive studies.

## 2022-02-02 NOTE — Assessment & Plan Note (Signed)
blood pressure control important in reducing the progression of atherosclerotic disease. On appropriate oral medications.  

## 2022-02-02 NOTE — Progress Notes (Signed)
MRN : [id number]  Beth Carroll is a 52 y.o. (11/12/1970) female who presents with chief complaint of  Chief Complaint  Patient presents with   Follow-up    Ultrasound follow up  .  History of Present Illness: Patient returns today in follow up of her PAD.  About 6 months ago, she underwent extensive lower extremity revascularization for limb salvage.  This was on the right lower extremity.  She is currently doing well.  She had an heel ulceration but that has largely healed and she is just keeping a bandage on there to avoid pressure.  She is not currently having any pain.  She is tolerating her medications without any issues.  Noninvasive studies today show normal arterial flow bilaterally with ABIs as of 1.17 on the right and 1.12 on the left with biphasic waveforms with digit pressures of 109 on the right and 113 on the left.  Current Outpatient Medications  Medication Sig Dispense Refill   acetaminophen (TYLENOL) 650 MG CR tablet Take 650 mg by mouth every 8 (eight) hours as needed for pain.     albuterol (VENTOLIN HFA) 108 (90 Base) MCG/ACT inhaler Inhale 2 puffs into the lungs every 6 (six) hours as needed for wheezing or shortness of breath.     aspirin EC 81 MG tablet Take 81 mg by mouth daily. Swallow whole.     atorvastatin (LIPITOR) 20 MG tablet Take 1 tablet (20 mg total) by mouth daily. 30 tablet 0   carvedilol (COREG) 3.125 MG tablet Take 1 tablet (3.125 mg total) by mouth 2 (two) times daily with a meal. 60 tablet 0   clotrimazole (LOTRIMIN) 1 % cream Apply 1 Application topically 2 (two) times daily.     ELIQUIS 5 MG TABS tablet TAKE 1 TABLET BY MOUTH TWICE A DAY 60 tablet 3   FARXIGA 10 MG TABS tablet TAKE 1 TABLET BY MOUTH EVERY DAY BEFORE BREAKFAST (Patient taking differently: Take 10 mg by mouth daily.) 30 tablet 3   feeding supplement (ENSURE ENLIVE / ENSURE PLUS) LIQD Take 237 mLs by mouth 3 (three) times daily between meals. 237 mL 12   furosemide (LASIX) 20 MG  tablet Take 20 mg by mouth daily.     hydrochlorothiazide (HYDRODIURIL) 25 MG tablet Take 25 mg by mouth daily.     insulin glargine (LANTUS SOLOSTAR) 100 UNIT/ML Solostar Pen Inject 25 Units into the skin daily.     loperamide (IMODIUM A-D) 2 MG tablet Take 2 mg by mouth 4 (four) times daily as needed for diarrhea or loose stools.     losartan (COZAAR) 25 MG tablet TAKE 1/2 TABLET BY MOUTH EVERY DAY 90 tablet 3   methocarbamol (ROBAXIN) 500 MG tablet Take 1 tablet (500 mg total) by mouth every 6 (six) hours as needed for muscle spasms. 30 tablet 0   Methylcobalamin (B12) 5000 MCG SUBL Place 5,000 mcg under the tongue daily.     Multiple Vitamins-Minerals (CENTRUM SILVER 50+WOMEN PO) Take 1 tablet by mouth daily.     nutrition supplement, JUVEN, (JUVEN) PACK Take 1 packet by mouth 2 (two) times daily between meals. 60 each 0   oxyCODONE-acetaminophen (PERCOCET) 5-325 MG tablet Take 1 tablet by mouth every 4 (four) hours as needed for severe pain. 30 tablet 0   pantoprazole (PROTONIX) 40 MG tablet Take 1 tablet (40 mg total) by mouth daily. 30 tablet 0   Semaglutide,0.25 or 0.'5MG'$ /DOS, 2 MG/3ML SOPN Inject into the skin.  tiotropium (SPIRIVA) 18 MCG inhalation capsule Place 1 capsule into inhaler and inhale daily.     Vitamin D, Ergocalciferol, (DRISDOL) 1.25 MG (50000 UNIT) CAPS capsule Take 50,000 Units by mouth once a week.     No current facility-administered medications for this visit.    Past Medical History:  Diagnosis Date   Coronary artery disease    Diabetes mellitus without complication ([place])    Diabetes mellitus, type 2 (HCC)    HFrEF (heart failure with reduced ejection fraction) ([place])    Hyperlipemia    Hypertension    Ischemic cardiomyopathy    Orthostatic hypotension    PAD (peripheral artery disease) ([facility])    Tobacco use     Past Surgical History:  Procedure Laterality Date   CORONARY STENT INTERVENTION N/A 12/15/2020   Procedure: CORONARY STENT INTERVENTION;   Surgeon: Wellington Hampshire, MD;  Location: [facility];  Service: Cardiovascular;  Laterality: N/A;   LEFT HEART CATH AND CORONARY ANGIOGRAPHY N/A 12/15/2020   Procedure: LEFT HEART CATH AND CORONARY ANGIOGRAPHY;  Surgeon: Wellington Hampshire, MD;  Location: [facility];  Service: Cardiovascular;  Laterality: N/A;   LOWER EXTREMITY ANGIOGRAPHY Left 12/10/2020   Procedure: Lower Extremity Angiography;  Surgeon: Algernon Huxley, MD;  Location: [facility];  Service: Cardiovascular;  Laterality: Left;   LOWER EXTREMITY ANGIOGRAPHY Right 12/25/2020   Procedure: LOWER EXTREMITY ANGIOGRAPHY;  Surgeon: Algernon Huxley, MD;  Location: [facility];  Service: Cardiovascular;  Laterality: Right;   LOWER EXTREMITY ANGIOGRAPHY Right 08/19/2021   Procedure: Lower Extremity Angiography;  Surgeon: Algernon Huxley, MD;  Location: [facility];  Service: Cardiovascular;  Laterality: Right;   LOWER EXTREMITY ANGIOGRAPHY Right 08/19/2021   Procedure: Lower Extremity Angiography;  Surgeon: Algernon Huxley, MD;  Location: [facility];  Service: Cardiovascular;  Laterality: Right;   ORIF FEMUR FRACTURE Right 12/14/2021   Procedure: OPEN REDUCTION INTERNAL FIXATION RIGHT DISTAL FEMUR;  Surgeon: Shona Needles, MD;  Location: [facility];  Service: Orthopedics;  Laterality: Right;     Social History   Tobacco Use   Smoking status: Former    Types: Cigarettes    Quit date: 12/09/2020    Years since quitting: 1.1   Smokeless tobacco: Never  Vaping Use   Vaping Use: Never used  Substance Use Topics   Alcohol use: Never       Family History  Problem Relation Age of Onset   Hypertension Father      No Known Allergies   REVIEW OF SYSTEMS (Negative unless checked)  Constitutional: '[]'$ Weight loss  '[]'$ Fever  '[]'$ Chills Cardiac: '[]'$ Chest pain   '[]'$ Chest pressure   '[]'$ Palpitations   '[]'$ Shortness of breath when laying flat   '[]'$ Shortness of breath at rest   '[]'$ Shortness of breath with  exertion. Vascular:  '[]'$ Pain in legs with walking   '[]'$ Pain in legs at rest   '[]'$ Pain in legs when laying flat   '[]'$ Claudication   '[]'$ Pain in feet when walking  '[]'$ Pain in feet at rest  '[]'$ Pain in feet when laying flat   '[]'$ History of DVT   '[]'$ Phlebitis   '[]'$ Swelling in legs   '[]'$ Varicose veins   '[x]'$ Non-healing ulcers Pulmonary:   '[]'$ Uses home oxygen   '[]'$ Productive cough   '[]'$ Hemoptysis   '[]'$ Wheeze  '[]'$ COPD   '[]'$ Asthma Neurologic:  '[]'$ Dizziness  '[]'$ Blackouts   '[]'$ Seizures   '[]'$ History of stroke   '[]'$ History of TIA  '[]'$ Aphasia   '[]'$ Temporary blindness   '[]'$   Dysphagia   '[]'$ Weakness or numbness in arms   '[]'$ Weakness or numbness in legs Musculoskeletal:  '[x]'$ Arthritis   '[]'$ Joint swelling   '[x]'$ Joint pain   '[]'$ Low back pain Hematologic:  '[]'$ Easy bruising  '[]'$ Easy bleeding   '[]'$ Hypercoagulable state   '[]'$ Anemic   Gastrointestinal:  '[]'$ Blood in stool   '[]'$ Vomiting blood  '[]'$ Gastroesophageal reflux/heartburn   '[]'$ Abdominal pain Genitourinary:  '[]'$ Chronic kidney disease   '[]'$ Difficult urination  '[]'$ Frequent urination  '[]'$ Burning with urination   '[]'$ Hematuria Skin:  '[]'$ Rashes   '[]'$ Ulcers   '[]'$ Wounds Psychological:  '[]'$ History of anxiety   '[]'$  History of major depression.  Physical Examination  BP 97/68 (BP Location: Right Arm)   Pulse 75   Resp 15   LMP 01/26/2015  Gen:   NAD, thin, appears older than stated age. Head: [place]/AT, + temporalis wasting. Ear/Nose/Throat: Hearing grossly intact, nares w/o erythema or drainage Eyes: Conjunctiva clear. Sclera non-icteric Neck: Supple.  Trachea midline Pulmonary:  Good air movement, no use of accessory muscles.  Cardiac: RRR, no JVD Vascular:  Vessel Right Left  Radial Palpable Palpable                          PT Palpable Palpable  DP Palpable Palpable   Gastrointestinal: soft, non-tender/non-distended. No guarding/reflex.  Musculoskeletal: M/S 5/5 throughout.  No deformity or atrophy. Trace LE edema. Neurologic: Sensation grossly intact in extremities.  Symmetrical.  Speech is fluent.   Psychiatric: Judgment intact, Mood & affect appropriate for pt's clinical situation. Dermatologic: No rashes or ulcers noted.  No cellulitis or open wounds.      Labs Recent Results (from the past 2160 hour(s))  Urinalysis, Routine w reflex microscopic     Status: Abnormal   Collection Time: 12/08/21 12:39 PM  Result Value Ref Range   Color, Urine YELLOW (A) YELLOW   APPearance CLOUDY (A) CLEAR   Specific Gravity, Urine 1.017 1.005 - 1.030   pH 6.0 5.0 - 8.0   Glucose, UA >=500 (A) NEGATIVE mg/dL   Hgb urine dipstick MODERATE (A) NEGATIVE   Bilirubin Urine NEGATIVE NEGATIVE   Ketones, ur 80 (A) NEGATIVE mg/dL   Protein, ur 100 (A) NEGATIVE mg/dL   Nitrite NEGATIVE NEGATIVE   Leukocytes,Ua LARGE (A) NEGATIVE   RBC / HPF >50 (H) 0 - 5 RBC/hpf   WBC, UA >50 (H) 0 - 5 WBC/hpf   Bacteria, UA RARE (A) NONE SEEN   Squamous Epithelial / HPF 0-5 0 - 5   Mucus PRESENT    Budding Yeast PRESENT     Comment: Performed at [facility], [address], [place], [place] [address]  Basic metabolic panel     Status: Abnormal   Collection Time: 12/08/21 12:39 PM  Result Value Ref Range   Sodium 137 135 - 145 mmol/L    Comment: ELECTROLYTES REPEATED TO VERIFY MU   Potassium 3.0 (L) 3.5 - 5.1 mmol/L   Chloride 109 98 - 111 mmol/L   CO2 7 (L) 22 - 32 mmol/L   Glucose, Bld 209 (H) 70 - 99 mg/dL    Comment: Glucose reference range applies only to samples taken after fasting for at least 8 hours.   BUN 22 (H) 6 - 20 mg/dL   Creatinine, Ser 0.74 0.44 - 1.00 mg/dL   Calcium 9.7 8.9 - 10.3 mg/dL   GFR, Estimated >60 >60 mL/min    Comment: (NOTE) Calculated using the CKD-EPI Creatinine Equation (2021)    Anion gap 21 (H)  5 - 15    Comment: Performed at [facility], [address] [place], [place] [address]  CBC     Status: None   Collection Time: 12/08/21 12:39 PM  Result Value Ref Range   WBC 8.4 4.0 - 10.5 K/uL   RBC 4.40 3.87 - 5.11 MIL/uL   Hemoglobin 12.0 12.0 -  15.0 g/dL   HCT 37.0 36.0 - 46.0 %   MCV 84.1 80.0 - 100.0 fL   MCH 27.3 26.0 - 34.0 pg   MCHC 32.4 30.0 - 36.0 g/dL   RDW 14.5 11.5 - 15.5 %   Platelets 304 150 - 400 K/uL   nRBC 0.0 0.0 - 0.2 %    Comment: Performed at [facility], [address] [place], [place] [address]  Pregnancy, urine     Status: None   Collection Time: 12/08/21 12:39 PM  Result Value Ref Range   Preg Test, Ur NEGATIVE NEGATIVE    Comment: Performed at [facility], [address], [place], [place] [address]  Urine Culture     Status: Abnormal   Collection Time: 12/08/21 12:39 PM   Specimen: Urine, Clean Catch  Result Value Ref Range   Specimen Description      URINE, CLEAN CATCH Performed at [facility], [address] [place], [place] [address]    Special Requests      NONE Performed at [facility], [place], [place] [address]    Culture (A)     30,000 COLONIES/mL ESCHERICHIA COLI WITHIN MIXED ORGANISMS Performed at [facility], [place] [address] [place], [place] [address]    Report Status 12/11/2021 FINAL    Organism ID, Bacteria ESCHERICHIA COLI (A)       Susceptibility   Escherichia coli - MIC*    AMPICILLIN <=2 SENSITIVE Sensitive     CEFAZOLIN <=4 SENSITIVE Sensitive     CEFEPIME <=0.12 SENSITIVE Sensitive     CEFTRIAXONE <=0.25 SENSITIVE Sensitive     CIPROFLOXACIN <=0.25 SENSITIVE Sensitive     GENTAMICIN <=1 SENSITIVE Sensitive     IMIPENEM <=0.25 SENSITIVE Sensitive     NITROFURANTOIN <=16 SENSITIVE Sensitive     TRIMETH/SULFA <=20 SENSITIVE Sensitive     AMPICILLIN/SULBACTAM <=2 SENSITIVE Sensitive     PIP/TAZO <=4 SENSITIVE Sensitive     * 30,000 COLONIES/mL ESCHERICHIA COLI  Magnesium     Status: None   Collection Time: 12/08/21 12:39 PM  Result Value Ref Range   Magnesium 2.4 1.7 - 2.4 mg/dL    Comment: Performed at [facility], [address] [place], [place] [address]  Glucose, capillary      Status: Abnormal   Collection Time: 12/09/21  9:46 PM  Result Value Ref Range   Glucose-Capillary 316 (H) 70 - 99 mg/dL    Comment: Glucose reference range applies only to samples taken after fasting for at least 8 hours.  Blood gas, arterial     Status: Abnormal   Collection Time: 12/10/21 12:30 AM  Result Value Ref Range   pH, Arterial 7.12 (LL) 7.35 - 7.45    Comment: CRITICAL RESULT CALLED TO, READ BACK BY AND VERIFIED WITH: BRIDGETTE SMITH, RRT 12/10/2021 0043 [place]    pCO2 arterial <18 (LL) 32 - 48 mmHg    Comment: CRITICAL RESULT CALLED TO, READ BACK BY AND VERIFIED WITH: BRIDGETTE SMITH, RRT 12/10/2021 0043 [place]    pO2, Arterial 123 (H) 83 - 108 mmHg    Comment:  CRITICAL RESULT CALLED TO, READ BACK BY AND VERIFIED WITH: Perrin Smack, RRT 12/10/2021 0043 [place]    Bicarbonate 2.7 (L) 20.0 - 28.0 mmol/L   Acid-base deficit 24.7 (H) 0.0 - 2.0 mmol/L   O2 Saturation 100 %   Patient temperature 34.7    Collection site RIGHT BRACHIAL    Drawn by [id number]    Allens test (pass/fail) PASS PASS    Comment: Performed at [facility], [place] [address] [place], [place] [address]  Culture, blood (Routine X 2) w Reflex to ID Panel     Status: None   Collection Time: 12/10/21  1:52 AM   Specimen: BLOOD LEFT HAND  Result Value Ref Range   Specimen Description BLOOD LEFT HAND    Special Requests      BOTTLES DRAWN AEROBIC ONLY Blood Culture results may not be optimal due to an inadequate volume of blood received in culture bottles   Culture      NO GROWTH 5 DAYS Performed at [facility], [place] [address] [place], [place] [address]    Report Status 12/15/2021 FINAL   CBC with Differential/Platelet     Status: Abnormal   Collection Time: 12/10/21  1:58 AM  Result Value Ref Range   WBC 9.8 4.0 - 10.5 K/uL   RBC 3.81 (L) 3.87 - 5.11 MIL/uL   Hemoglobin 10.7 (L) 12.0 - 15.0 g/dL   HCT 33.2 (L) 36.0 - 46.0 %   MCV 87.1 80.0 - 100.0 fL   MCH 28.1 26.0 - 34.0 pg   MCHC  32.2 30.0 - 36.0 g/dL   RDW 14.6 11.5 - 15.5 %   Platelets 270 150 - 400 K/uL   nRBC 0.0 0.0 - 0.2 %   Neutrophils Relative % 89 %   Neutro Abs 8.8 (H) 1.7 - 7.7 K/uL   Lymphocytes Relative 6 %   Lymphs Abs 0.6 (L) 0.7 - 4.0 K/uL   Monocytes Relative 4 %   Monocytes Absolute 0.4 0.1 - 1.0 K/uL   Eosinophils Relative 0 %   Eosinophils Absolute 0.0 0.0 - 0.5 K/uL   Basophils Relative 0 %   Basophils Absolute 0.0 0.0 - 0.1 K/uL   Immature Granulocytes 1 %   Abs Immature Granulocytes 0.05 0.00 - 0.07 K/uL    Comment: Performed at [facility], [place] [address], [place], [place] [address]  Comprehensive metabolic panel     Status: Abnormal   Collection Time: 12/10/21  1:58 AM  Result Value Ref Range   Sodium 138 135 - 145 mmol/L   Potassium 2.8 (L) 3.5 - 5.1 mmol/L   Chloride 106 98 - 111 mmol/L   CO2 <7 (L) 22 - 32 mmol/L   Glucose, Bld 341 (H) 70 - 99 mg/dL    Comment: Glucose reference range applies only to samples taken after fasting for at least 8 hours.   BUN 38 (H) 6 - 20 mg/dL   Creatinine, Ser 1.32 (H) 0.44 - 1.00 mg/dL   Calcium 9.2 8.9 - 10.3 mg/dL   Total Protein 5.6 (L) 6.5 - 8.1 g/dL   Albumin 2.6 (L) 3.5 - 5.0 g/dL   AST 10 (L) 15 - 41 U/L   ALT 10 0 - 44 U/L   Alkaline Phosphatase 68 38 - 126 U/L   Total Bilirubin 1.5 (H) 0.3 - 1.2 mg/dL   GFR, Estimated 49 (L) >60 mL/min    Comment: (NOTE) Calculated using the CKD-EPI Creatinine Equation (2021)  Anion gap NOT CALCULATED 5 - 15    Comment: Performed at [facility] [address], [place], [place] [address]  Magnesium     Status: None   Collection Time: 12/10/21  1:58 AM  Result Value Ref Range   Magnesium 2.3 1.7 - 2.4 mg/dL    Comment: Performed at [facility] [address] [place], [place] [address]  Phosphorus     Status: Abnormal   Collection Time: 12/10/21  1:58 AM  Result Value Ref Range   Phosphorus 2.3 (L) 2.5 - 4.6 mg/dL    Comment: Performed at [facility] [address] [place], [place] [address]  Lactic acid, plasma     Status: None   Collection Time: 12/10/21  1:58 AM  Result Value Ref Range   Lactic Acid, Venous 1.2 0.5 - 1.9 mmol/L    Comment: Performed at [facility] [address] [place], [place] [address]  Brain natriuretic peptide     Status: Abnormal   Collection Time: 12/10/21  1:58 AM  Result Value Ref Range   B Natriuretic Peptide 311.6 (H) 0.0 - 100.0 pg/mL    Comment: Performed at [facility] [address] [place], [place] [address]  CK     Status: None   Collection Time: 12/10/21  1:58 AM  Result Value Ref Range   Total CK 57 38 - 234 U/L    Comment: Performed at [facility], [place] [address], [place], [place] [address]  Culture, blood (Routine X 2) w Reflex to ID Panel     Status: None   Collection Time: 12/10/21  1:58 AM   Specimen: BLOOD RIGHT HAND  Result Value Ref Range   Specimen Description BLOOD RIGHT HAND    Special Requests      BOTTLES DRAWN AEROBIC ONLY Blood Culture results may not be optimal due to an inadequate volume of blood received in culture bottles   Culture      NO GROWTH 5 DAYS Performed at [facility], [place] [address], [place], [place] [address]    Report Status 12/15/2021 FINAL   Troponin I (High Sensitivity)     Status: Abnormal   Collection Time: 12/10/21  1:58 AM  Result Value Ref Range   Troponin I (High Sensitivity) 43 (H) <18 ng/L    Comment: (NOTE) Elevated high sensitivity troponin I (hsTnI) values and significant  changes across serial measurements may suggest ACS but many other  chronic and acute conditions are known to elevate hsTnI results.  Refer to the "Links" section for chest pain algorithms and additional  guidance. Performed at [facility], [place] [address] [place], [place] [address]   Procalcitonin - Baseline     Status: None   Collection Time: 12/10/21  1:58 AM  Result Value Ref Range   Procalcitonin <0.10 ng/mL    Comment:         Interpretation: PCT (Procalcitonin) <= 0.5 ng/mL: Systemic infection (sepsis) is not likely. Local bacterial infection is possible. (NOTE)       Sepsis PCT Algorithm           Lower Respiratory Tract                                      Infection PCT Algorithm    ----------------------------     ----------------------------  PCT < 0.25 ng/mL                PCT < 0.10 ng/mL          Strongly encourage             Strongly discourage   discontinuation of antibiotics    initiation of antibiotics    ----------------------------     -----------------------------       PCT 0.25 - 0.50 ng/mL            PCT 0.10 - 0.25 ng/mL               OR       >80% decrease in PCT            Discourage initiation of                                            antibiotics      Encourage discontinuation           of antibiotics    ----------------------------     -----------------------------         PCT >= 0.50 ng/mL              PCT 0.26 - 0.50 ng/mL               AND        <80% decrease in PCT             Encourage initiation of                                             antibiotics       Encourage continuation           of antibiotics    ----------------------------     -----------------------------        PCT >= 0.50 ng/mL                  PCT > 0.50 ng/mL               AND         increase in PCT                  Strongly encourage                                      initiation of antibiotics    Strongly encourage escalation           of antibiotics                                     -----------------------------                                           PCT <= 0.25 ng/mL  OR                                        > 80% decrease in PCT                                      Discontinue / Do not initiate                                             antibiotics  Performed at [facility], [place] [address] [place], [place] [address]    Hemoglobin A1c     Status: Abnormal   Collection Time: 12/10/21  1:58 AM  Result Value Ref Range   Hgb A1c MFr Bld 8.1 (H) 4.8 - 5.6 %    Comment: (NOTE) Pre diabetes:          5.7%-6.4%  Diabetes:              >6.4%  Glycemic control for   <7.0% adults with diabetes    Mean Plasma Glucose 185.77 mg/dL    Comment: Performed at [facility] [address] [place], [place] [address]  Glucose, capillary     Status: Abnormal   Collection Time: 12/10/21  2:08 AM  Result Value Ref Range   Glucose-Capillary 318 (H) 70 - 99 mg/dL    Comment: Glucose reference range applies only to samples taken after fasting for at least 8 hours.  MRSA Next Gen by PCR, Nasal     Status: None   Collection Time: 12/10/21  2:22 AM   Specimen: Nasal Mucosa; Nasal Swab  Result Value Ref Range   MRSA by PCR Next Gen NOT DETECTED NOT DETECTED    Comment: (NOTE) The GeneXpert MRSA Assay (FDA approved for NASAL specimens only), is one component of a comprehensive MRSA colonization surveillance program. It is not intended to diagnose MRSA infection nor to guide or monitor treatment for MRSA infections. Test performance is not FDA approved in patients less than 61 years old. Performed at [facility], [place] [address], [place], [place] [address]   Glucose, capillary     Status: Abnormal   Collection Time: 12/10/21  3:20 AM  Result Value Ref Range   Glucose-Capillary 332 (H) 70 - 99 mg/dL    Comment: Glucose reference range applies only to samples taken after fasting for at least 8 hours.  Lactic acid, plasma     Status: None   Collection Time: 12/10/21  3:50 AM  Result Value Ref Range   Lactic Acid, Venous 1.3 0.5 - 1.9 mmol/L    Comment: Performed at [facility] [address] [place], [place] [address]  Beta-hydroxybutyric acid     Status: Abnormal   Collection Time: 12/10/21  3:50 AM  Result Value Ref Range   Beta-Hydroxybutyric Acid >8.00 (H) 0.05 - 0.27 mmol/L    Comment:  RESULT CONFIRMED BY MANUAL DILUTION Performed at [facility] [address], [place], [place] [address]   Basic metabolic panel     Status: Abnormal   Collection Time: 12/10/21  3:50 AM  Result Value Ref Range   Sodium 135 135 - 145 mmol/L  Potassium 2.5 (LL) 3.5 - 5.1 mmol/L    Comment: CRITICAL RESULT CALLED TO, READ BACK BY AND VERIFIED WITH Sanjuana Mae, RN AT [phone] ON 12/10/21 BY H. HOWARD.   Chloride 106 98 - 111 mmol/L   CO2 <7 (L) 22 - 32 mmol/L   Glucose, Bld 371 (H) 70 - 99 mg/dL    Comment: Glucose reference range applies only to samples taken after fasting for at least 8 hours.   BUN 34 (H) 6 - 20 mg/dL   Creatinine, Ser 1.25 (H) 0.44 - 1.00 mg/dL   Calcium 8.7 (L) 8.9 - 10.3 mg/dL   GFR, Estimated 52 (L) >60 mL/min    Comment: (NOTE) Calculated using the CKD-EPI Creatinine Equation (2021)    Anion gap NOT CALCULATED 5 - 15    Comment: Performed at [facility] [address] [place], [place] [address]  Troponin I (High Sensitivity)     Status: Abnormal   Collection Time: 12/10/21  3:50 AM  Result Value Ref Range   Troponin I (High Sensitivity) 42 (H) <18 ng/L    Comment: (NOTE) Elevated high sensitivity troponin I (hsTnI) values and significant  changes across serial measurements may suggest ACS but many other  chronic and acute conditions are known to elevate hsTnI results.  Refer to the Links section for chest pain algorithms and additional  guidance. Performed at [facility], [place] [address] [place], [place] [address]   Salicylate level     Status: Abnormal   Collection Time: 12/10/21  3:50 AM  Result Value Ref Range   Salicylate Lvl <8.6 (L) 7.0 - 30.0 mg/dL    Comment: Performed at [facility] [name], [place], [place] [address]  Acetaminophen level     Status: Abnormal   Collection Time: 12/10/21  3:50 AM  Result Value Ref Range   Acetaminophen (Tylenol), Serum <10 (L) 10 - 30 ug/mL    Comment: (NOTE) Therapeutic  concentrations vary significantly. A range of 10-30 ug/mL  may be an effective concentration for many patients. However, some  are best treated at concentrations outside of this range. Acetaminophen concentrations >150 ug/mL at 4 hours after ingestion  and >50 ug/mL at 12 hours after ingestion are often associated with  toxic reactions.  Performed at [facility], [place] [address], [place],  [address]   Basic metabolic panel     Status: Abnormal   Collection Time: 12/10/21  6:57 AM  Result Value Ref Range   Sodium 135 135 - 145 mmol/L   Potassium 2.1 (LL) 3.5 - 5.1 mmol/L    Comment: CRITICAL RESULT CALLED TO, READ BACK BY AND VERIFIED WITH BASS,L RN @ 12/10/21 LEONARD,A   Chloride 107 98 - 111 mmol/L   CO2 <7 (L) 22 - 32 mmol/L   Glucose, Bld 351 (H) 70 - 99 mg/dL    Comment: Glucose reference range applies only to samples taken after fasting for at least 8 hours.   BUN 34 (H) 6 - 20 mg/dL   Creatinine, Ser 1.45 (H) 0.44 - 1.00 mg/dL   Calcium 8.7 (L) 8.9 - 10.3 mg/dL   GFR, Estimated 43 (L) >60 mL/min    Comment: (NOTE) Calculated using the CKD-EPI Creatinine Equation (2021)    Anion gap NOT CALCULATED 5 - 15    Comment: Performed at [facility] [address], [place],  [address]  Osmolality     Status: Abnormal   Collection Time:  12/10/21  6:57 AM  Result Value Ref Range   Osmolality 329 (HH) 275 - 295 mOsm/kg    Comment: REPEATED TO VERIFY CRITICAL RESULT CALLED TO, READ BACK BY AND VERIFIED WITH: Delanna Notice 1312 12/10/21 MU NOTIFIED B.[facility] RN 12/10/2021 1319 BNUNNERY Performed at [facility] [address], [place], [place] [address]   Lipase, blood     Status: Abnormal   Collection Time: 12/10/21  6:57 AM  Result Value Ref Range   Lipase 341 (H) 11 - 51 U/L    Comment: Performed at [facility], [place] [address], [place], [place] [address]  Amylase     Status: Abnormal   Collection Time: 12/10/21  6:57 AM  Result Value Ref  Range   Amylase 316 (H) 28 - 100 U/L    Comment: Performed at [facility], [place] [address], [place], [place] [address]  Glucose, capillary     Status: Abnormal   Collection Time: 12/10/21  7:47 AM  Result Value Ref Range   Glucose-Capillary 343 (H) 70 - 99 mg/dL    Comment: Glucose reference range applies only to samples taken after fasting for at least 8 hours.  Basic metabolic panel     Status: Abnormal   Collection Time: 12/10/21  8:17 AM  Result Value Ref Range   Sodium 135 135 - 145 mmol/L   Potassium 2.4 (LL) 3.5 - 5.1 mmol/L    Comment: CRITICAL RESULT CALLED TO, READ BACK BY AND VERIFIED WITH BASS,L RN @ [phone] 12/10/21 LEONARD,A   Chloride 107 98 - 111 mmol/L   CO2 8 (L) 22 - 32 mmol/L   Glucose, Bld 337 (H) 70 - 99 mg/dL    Comment: Glucose reference range applies only to samples taken after fasting for at least 8 hours.   BUN 32 (H) 6 - 20 mg/dL   Creatinine, Ser 1.38 (H) 0.44 - 1.00 mg/dL   Calcium 8.7 (L) 8.9 - 10.3 mg/dL   GFR, Estimated 46 (L) >60 mL/min    Comment: (NOTE) Calculated using the CKD-EPI Creatinine Equation (2021)    Anion gap 20 (H) 5 - 15    Comment: Performed at [facility] [address] [place], [place] [address]  Beta-hydroxybutyric acid     Status: Abnormal   Collection Time: 12/10/21  8:17 AM  Result Value Ref Range   Beta-Hydroxybutyric Acid >8.00 (H) 0.05 - 0.27 mmol/L    Comment: RESULTS CONFIRMED BY MANUAL DILUTION Performed at [facility] [address] [place], [place] [address] CORRECTED ON 11/16 AT 4166: PREVIOUSLY REPORTED AS >8.00 RESULT CONFIRMED BY AUTOMATED DILUTION   Glucose, capillary     Status: Abnormal   Collection Time: 12/10/21  9:42 AM  Result Value Ref Range   Glucose-Capillary 280 (H) 70 - 99 mg/dL    Comment: Glucose reference range applies only to samples taken after fasting for at least 8 hours.  Glucose, capillary     Status: Abnormal   Collection Time: 12/10/21 10:42 AM  Result Value Ref  Range   Glucose-Capillary 257 (H) 70 - 99 mg/dL    Comment: Glucose reference range applies only to samples taken after fasting for at least 8 hours.  Basic metabolic panel     Status: Abnormal   Collection Time: 12/10/21 10:55 AM  Result Value Ref Range   Sodium 139 135 - 145 mmol/L   Potassium 2.6 (LL) 3.5 - 5.1 mmol/L    Comment: CRITICAL  RESULT CALLED TO, READ BACK BY AND VERIFIED WITH BASS,L RN @ 1154 12/10/21 LEONARD,A   Chloride 114 (H) 98 - 111 mmol/L   CO2 12 (L) 22 - 32 mmol/L   Glucose, Bld 275 (H) 70 - 99 mg/dL    Comment: Glucose reference range applies only to samples taken after fasting for at least 8 hours.   BUN 28 (H) 6 - 20 mg/dL   Creatinine, Ser 1.27 (H) 0.44 - 1.00 mg/dL   Calcium 8.4 (L) 8.9 - 10.3 mg/dL   GFR, Estimated 51 (L) >60 mL/min    Comment: (NOTE) Calculated using the CKD-EPI Creatinine Equation (2021)    Anion gap 13 5 - 15    Comment: Performed at [facility] [address], [place], [place] [address]  Glucose, capillary     Status: Abnormal   Collection Time: 12/10/21 11:47 AM  Result Value Ref Range   Glucose-Capillary 250 (H) 70 - 99 mg/dL    Comment: Glucose reference range applies only to samples taken after fasting for at least 8 hours.  Heparin level (unfractionated)     Status: None   Collection Time: 12/10/21 11:58 AM  Result Value Ref Range   Heparin Unfractionated 0.45 0.30 - 0.70 IU/mL    Comment: (NOTE) The clinical reportable range upper limit is being lowered to >1.10 to align with the FDA approved guidance for the current laboratory assay.  If heparin results are below expected values, and patient dosage has  been confirmed, suggest follow up testing of antithrombin III levels. Performed at [facility], [place] [address], [place], [place] [address]   APTT     Status: Abnormal   Collection Time: 12/10/21 11:58 AM  Result Value Ref Range   aPTT 44 (H) 24 - 36 seconds    Comment:        IF BASELINE aPTT IS  ELEVATED, SUGGEST PATIENT RISK ASSESSMENT BE USED TO DETERMINE APPROPRIATE ANTICOAGULANT THERAPY. Performed at [facility], [place] [address], [place], [place] [address]   Basic metabolic panel     Status: Abnormal   Collection Time: 12/10/21 11:58 AM  Result Value Ref Range   Sodium 141 135 - 145 mmol/L   Potassium 2.7 (LL) 3.5 - 5.1 mmol/L    Comment: CRITICAL RESULT CALLED TO, READ BACK BY AND VERIFIED WITH BASS,L RN @ 1249 12/10/21 LEONARD,A   Chloride 116 (H) 98 - 111 mmol/L   CO2 16 (L) 22 - 32 mmol/L   Glucose, Bld 253 (H) 70 - 99 mg/dL    Comment: Glucose reference range applies only to samples taken after fasting for at least 8 hours.   BUN 27 (H) 6 - 20 mg/dL   Creatinine, Ser 1.16 (H) 0.44 - 1.00 mg/dL   Calcium 8.6 (L) 8.9 - 10.3 mg/dL   GFR, Estimated 57 (L) >60 mL/min    Comment: (NOTE) Calculated using the CKD-EPI Creatinine Equation (2021)    Anion gap 9 5 - 15    Comment: Performed at [facility] [address] [place], [place] [address]  Glucose, capillary     Status: Abnormal   Collection Time: 12/10/21 12:44 PM  Result Value Ref Range   Glucose-Capillary 232 (H) 70 - 99 mg/dL    Comment: Glucose reference range applies only to samples taken after fasting for at least 8 hours.  Glucose, capillary     Status: Abnormal   Collection Time: 12/10/21  2:03 PM  Result  Value Ref Range   Glucose-Capillary 200 (H) 70 - 99 mg/dL    Comment: Glucose reference range applies only to samples taken after fasting for at least 8 hours.  Glucose, capillary     Status: Abnormal   Collection Time: 12/10/21  2:46 PM  Result Value Ref Range   Glucose-Capillary 203 (H) 70 - 99 mg/dL    Comment: Glucose reference range applies only to samples taken after fasting for at least 8 hours.  Basic metabolic panel     Status: Abnormal   Collection Time: 12/10/21  3:36 PM  Result Value Ref Range   Sodium 141 135 - 145 mmol/L   Potassium 3.1 (L) 3.5 - 5.1 mmol/L   Chloride  117 (H) 98 - 111 mmol/L   CO2 19 (L) 22 - 32 mmol/L   Glucose, Bld 197 (H) 70 - 99 mg/dL    Comment: Glucose reference range applies only to samples taken after fasting for at least 8 hours.   BUN 24 (H) 6 - 20 mg/dL   Creatinine, Ser 0.80 0.44 - 1.00 mg/dL   Calcium 8.6 (L) 8.9 - 10.3 mg/dL   GFR, Estimated >60 >60 mL/min    Comment: (NOTE) Calculated using the CKD-EPI Creatinine Equation (2021)    Anion gap 5 5 - 15    Comment: Performed at [facility] [address] [place], [place] [address]  Beta-hydroxybutyric acid     Status: Abnormal   Collection Time: 12/10/21  3:36 PM  Result Value Ref Range   Beta-Hydroxybutyric Acid 0.95 (H) 0.05 - 0.27 mmol/L    Comment: Performed at [facility] [address] [place], [place] [address]  Glucose, capillary     Status: Abnormal   Collection Time: 12/10/21  4:12 PM  Result Value Ref Range   Glucose-Capillary 192 (H) 70 - 99 mg/dL    Comment: Glucose reference range applies only to samples taken after fasting for at least 8 hours.  Glucose, capillary     Status: Abnormal   Collection Time: 12/10/21  5:21 PM  Result Value Ref Range   Glucose-Capillary 186 (H) 70 - 99 mg/dL    Comment: Glucose reference range applies only to samples taken after fasting for at least 8 hours.  Glucose, capillary     Status: Abnormal   Collection Time: 12/10/21  6:11 PM  Result Value Ref Range   Glucose-Capillary 159 (H) 70 - 99 mg/dL    Comment: Glucose reference range applies only to samples taken after fasting for at least 8 hours.  Basic metabolic panel     Status: Abnormal   Collection Time: 12/10/21  7:02 PM  Result Value Ref Range   Sodium 144 135 - 145 mmol/L   Potassium 3.3 (L) 3.5 - 5.1 mmol/L   Chloride 116 (H) 98 - 111 mmol/L   CO2 21 (L) 22 - 32 mmol/L   Glucose, Bld 168 (H) 70 - 99 mg/dL    Comment: Glucose reference range applies only to samples taken after fasting for at least 8 hours.   BUN 22 (H) 6 - 20 mg/dL    Creatinine, Ser 0.72 0.44 - 1.00 mg/dL   Calcium 8.8 (L) 8.9 - 10.3 mg/dL   GFR, Estimated >60 >60 mL/min    Comment: (NOTE) Calculated using the CKD-EPI Creatinine Equation (2021)    Anion gap 7 5 - 15    Comment: Performed at [facility] [address], [place], [place] [address]  Glucose, capillary     Status: Abnormal   Collection Time: 12/10/21  7:33 PM  Result Value Ref Range   Glucose-Capillary 157 (H) 70 - 99 mg/dL    Comment: Glucose reference range applies only to samples taken after fasting for at least 8 hours.  Glucose, capillary     Status: Abnormal   Collection Time: 12/10/21  8:31 PM  Result Value Ref Range   Glucose-Capillary 148 (H) 70 - 99 mg/dL    Comment: Glucose reference range applies only to samples taken after fasting for at least 8 hours.  Heparin level (unfractionated)     Status: None   Collection Time: 12/10/21  8:55 PM  Result Value Ref Range   Heparin Unfractionated 0.38 0.30 - 0.70 IU/mL    Comment: (NOTE) The clinical reportable range upper limit is being lowered to >1.10 to align with the FDA approved guidance for the current laboratory assay.  If heparin results are below expected values, and patient dosage has  been confirmed, suggest follow up testing of antithrombin III levels. Performed at [facility], [place] [address] [place], [place] [address]   APTT     Status: Abnormal   Collection Time: 12/10/21  8:55 PM  Result Value Ref Range   aPTT 50 (H) 24 - 36 seconds    Comment:        IF BASELINE aPTT IS ELEVATED, SUGGEST PATIENT RISK ASSESSMENT BE USED TO DETERMINE APPROPRIATE ANTICOAGULANT THERAPY. Performed at [facility], [place] [address], [place], [place] [address]   Glucose, capillary     Status: Abnormal   Collection Time: 12/10/21 10:32 PM  Result Value Ref Range   Glucose-Capillary 163 (H) 70 - 99 mg/dL    Comment: Glucose reference range applies only to samples taken after fasting for at least 8 hours.   Basic metabolic panel     Status: Abnormal   Collection Time: 12/10/21 11:40 PM  Result Value Ref Range   Sodium 142 135 - 145 mmol/L   Potassium 3.2 (L) 3.5 - 5.1 mmol/L   Chloride 116 (H) 98 - 111 mmol/L   CO2 20 (L) 22 - 32 mmol/L   Glucose, Bld 158 (H) 70 - 99 mg/dL    Comment: Glucose reference range applies only to samples taken after fasting for at least 8 hours.   BUN 18 6 - 20 mg/dL   Creatinine, Ser 0.74 0.44 - 1.00 mg/dL   Calcium 8.8 (L) 8.9 - 10.3 mg/dL   GFR, Estimated >60 >60 mL/min    Comment: (NOTE) Calculated using the CKD-EPI Creatinine Equation (2021)    Anion gap 6 5 - 15    Comment: Performed at [facility] [address] [place], [place] [address]  Beta-hydroxybutyric acid     Status: None   Collection Time: 12/10/21 11:40 PM  Result Value Ref Range   Beta-Hydroxybutyric Acid 0.09 0.05 - 0.27 mmol/L    Comment: Performed at [facility] [address], [place], [place] [address]  Glucose, capillary     Status: Abnormal   Collection Time: 12/11/21 12:28 AM  Result Value Ref Range   Glucose-Capillary 146 (H) 70 - 99 mg/dL    Comment: Glucose reference range applies only to samples taken after fasting for at least 8 hours.  Glucose, capillary     Status: Abnormal   Collection Time: 12/11/21  2:31 AM  Result Value Ref Range   Glucose-Capillary 151 (H) 70 - 99  mg/dL    Comment: Glucose reference range applies only to samples taken after fasting for at least 8 hours.  Basic metabolic panel     Status: Abnormal   Collection Time: 12/11/21  2:39 AM  Result Value Ref Range   Sodium 142 135 - 145 mmol/L   Potassium 3.4 (L) 3.5 - 5.1 mmol/L   Chloride 112 (H) 98 - 111 mmol/L   CO2 21 (L) 22 - 32 mmol/L   Glucose, Bld 152 (H) 70 - 99 mg/dL    Comment: Glucose reference range applies only to samples taken after fasting for at least 8 hours.   BUN 16 6 - 20 mg/dL   Creatinine, Ser 0.57 0.44 - 1.00 mg/dL   Calcium 8.9 8.9 - 10.3 mg/dL   GFR,  Estimated >60 >60 mL/min    Comment: (NOTE) Calculated using the CKD-EPI Creatinine Equation (2021)    Anion gap 9 5 - 15    Comment: Performed at [facility] [address], [place], [place] [address]  Magnesium     Status: None   Collection Time: 12/11/21  2:39 AM  Result Value Ref Range   Magnesium 1.7 1.7 - 2.4 mg/dL    Comment: Performed at [facility] [address] [place], [place] [address]  CK total and CKMB (cardiac)not at [facility]     Status: None   Collection Time: 12/11/21  2:39 AM  Result Value Ref Range   Total CK 80 38 - 234 U/L   CK, MB 2.2 0.5 - 5.0 ng/mL   Relative Index RELATIVE INDEX IS INVALID 0.0 - 2.5    Comment: WHEN CK < 100 U/L        Performed at [facility] [address] [place], [place] [address]   Glucose, capillary     Status: Abnormal   Collection Time: 12/11/21  4:41 AM  Result Value Ref Range   Glucose-Capillary 132 (H) 70 - 99 mg/dL    Comment: Glucose reference range applies only to samples taken after fasting for at least 8 hours.  Glucose, capillary     Status: Abnormal   Collection Time: 12/11/21  5:43 AM  Result Value Ref Range   Glucose-Capillary 126 (H) 70 - 99 mg/dL    Comment: Glucose reference range applies only to samples taken after fasting for at least 8 hours.  Glucose, capillary     Status: Abnormal   Collection Time: 12/11/21  6:42 AM  Result Value Ref Range   Glucose-Capillary 146 (H) 70 - 99 mg/dL    Comment: Glucose reference range applies only to samples taken after fasting for at least 8 hours.  Glucose, capillary     Status: Abnormal   Collection Time: 12/11/21  7:48 AM  Result Value Ref Range   Glucose-Capillary 132 (H) 70 - 99 mg/dL    Comment: Glucose reference range applies only to samples taken after fasting for at least 8 hours.  Procalcitonin     Status: None   Collection Time: 12/11/21  7:56 AM  Result Value Ref Range   Procalcitonin <0.10 ng/mL    Comment:         Interpretation: PCT (Procalcitonin) <= 0.5 ng/mL: Systemic infection (sepsis) is not likely. Local bacterial infection is possible. (NOTE)       Sepsis PCT Algorithm           Lower Respiratory Tract  Infection PCT Algorithm    ----------------------------     ----------------------------         PCT < 0.25 ng/mL                PCT < 0.10 ng/mL          Strongly encourage             Strongly discourage   discontinuation of antibiotics    initiation of antibiotics    ----------------------------     -----------------------------       PCT 0.25 - 0.50 ng/mL            PCT 0.10 - 0.25 ng/mL               OR       >80% decrease in PCT            Discourage initiation of                                            antibiotics      Encourage discontinuation           of antibiotics    ----------------------------     -----------------------------         PCT >= 0.50 ng/mL              PCT 0.26 - 0.50 ng/mL               AND        <80% decrease in PCT             Encourage initiation of                                             antibiotics       Encourage continuation           of antibiotics    ----------------------------     -----------------------------        PCT >= 0.50 ng/mL                  PCT > 0.50 ng/mL               AND         increase in PCT                  Strongly encourage                                      initiation of antibiotics    Strongly encourage escalation           of antibiotics                                     -----------------------------                                           PCT <= 0.25 ng/mL  OR                                        > 80% decrease in PCT                                      Discontinue / Do not initiate                                             antibiotics  Performed at [facility], [place] [address], [place], [place] [address]    CBC     Status: Abnormal   Collection Time: 12/11/21  7:56 AM  Result Value Ref Range   WBC 7.7 4.0 - 10.5 K/uL   RBC 3.13 (L) 3.87 - 5.11 MIL/uL   Hemoglobin 8.6 (L) 12.0 - 15.0 g/dL   HCT 25.5 (L) 36.0 - 46.0 %   MCV 81.5 80.0 - 100.0 fL   MCH 27.5 26.0 - 34.0 pg   MCHC 33.7 30.0 - 36.0 g/dL   RDW 14.8 11.5 - 15.5 %   Platelets 199 150 - 400 K/uL   nRBC 0.0 0.0 - 0.2 %    Comment: Performed at [facility], [place] [address] [place], [place] [address]  Comprehensive metabolic panel     Status: Abnormal   Collection Time: 12/11/21  7:56 AM  Result Value Ref Range   Sodium 142 135 - 145 mmol/L   Potassium 3.3 (L) 3.5 - 5.1 mmol/L   Chloride 114 (H) 98 - 111 mmol/L   CO2 22 22 - 32 mmol/L   Glucose, Bld 142 (H) 70 - 99 mg/dL    Comment: Glucose reference range applies only to samples taken after fasting for at least 8 hours.   BUN 13 6 - 20 mg/dL   Creatinine, Ser 0.61 0.44 - 1.00 mg/dL   Calcium 9.0 8.9 - 10.3 mg/dL   Total Protein 4.9 (L) 6.5 - 8.1 g/dL   Albumin 2.3 (L) 3.5 - 5.0 g/dL   AST 17 15 - 41 U/L   ALT 10 0 - 44 U/L   Alkaline Phosphatase 56 38 - 126 U/L   Total Bilirubin 0.4 0.3 - 1.2 mg/dL   GFR, Estimated >60 >60 mL/min    Comment: (NOTE) Calculated using the CKD-EPI Creatinine Equation (2021)    Anion gap 6 5 - 15    Comment: Performed at [facility], [place] [address], [place], [place] [address]  APTT     Status: Abnormal   Collection Time: 12/11/21  7:56 AM  Result Value Ref Range   aPTT 73 (H) 24 - 36 seconds    Comment:        IF BASELINE aPTT IS ELEVATED, SUGGEST PATIENT RISK ASSESSMENT BE USED TO DETERMINE APPROPRIATE ANTICOAGULANT THERAPY. Performed at [facility], [place] [address] [place], [place] [address]   Heparin level (unfractionated)     Status: Abnormal   Collection Time: 12/11/21  7:56 AM  Result Value Ref Range   Heparin Unfractionated 0.24 (L) 0.30 - 0.70 IU/mL    Comment: (NOTE) The clinical reportable range upper  limit is being lowered to >1.10 to  align with the FDA approved guidance for the current laboratory assay.  If heparin results are below expected values, and patient dosage has  been confirmed, suggest follow up testing of antithrombin III levels. Performed at [facility], [place] [address], [place], [place] [address]   Glucose, capillary     Status: Abnormal   Collection Time: 12/11/21  8:46 AM  Result Value Ref Range   Glucose-Capillary 132 (H) 70 - 99 mg/dL    Comment: Glucose reference range applies only to samples taken after fasting for at least 8 hours.  Glucose, capillary     Status: Abnormal   Collection Time: 12/11/21  9:44 AM  Result Value Ref Range   Glucose-Capillary 136 (H) 70 - 99 mg/dL    Comment: Glucose reference range applies only to samples taken after fasting for at least 8 hours.  Basic metabolic panel     Status: Abnormal   Collection Time: 12/11/21 10:21 AM  Result Value Ref Range   Sodium 144 135 - 145 mmol/L   Potassium 3.3 (L) 3.5 - 5.1 mmol/L   Chloride 118 (H) 98 - 111 mmol/L   CO2 22 22 - 32 mmol/L   Glucose, Bld 114 (H) 70 - 99 mg/dL    Comment: Glucose reference range applies only to samples taken after fasting for at least 8 hours.   BUN 12 6 - 20 mg/dL   Creatinine, Ser 0.49 0.44 - 1.00 mg/dL   Calcium 8.9 8.9 - 10.3 mg/dL   GFR, Estimated >60 >60 mL/min    Comment: (NOTE) Calculated using the CKD-EPI Creatinine Equation (2021)    Anion gap 4 (L) 5 - 15    Comment: Performed at [facility] [address] [place], [place] [address]  Glucose, capillary     Status: None   Collection Time: 12/11/21 11:28 AM  Result Value Ref Range   Glucose-Capillary 97 70 - 99 mg/dL    Comment: Glucose reference range applies only to samples taken after fasting for at least 8 hours.  Glucose, capillary     Status: Abnormal   Collection Time: 12/11/21  3:43 PM  Result Value Ref Range   Glucose-Capillary 69 (L) 70 - 99 mg/dL    Comment: Glucose  reference range applies only to samples taken after fasting for at least 8 hours.  Glucose, capillary     Status: Abnormal   Collection Time: 12/11/21  4:15 PM  Result Value Ref Range   Glucose-Capillary 107 (H) 70 - 99 mg/dL    Comment: Glucose reference range applies only to samples taken after fasting for at least 8 hours.  Heparin level (unfractionated)     Status: Abnormal   Collection Time: 12/11/21  4:44 PM  Result Value Ref Range   Heparin Unfractionated 0.29 (L) 0.30 - 0.70 IU/mL    Comment: (NOTE) The clinical reportable range upper limit is being lowered to >1.10 to align with the FDA approved guidance for the current laboratory assay.  If heparin results are below expected values, and patient dosage has  been confirmed, suggest follow up testing of antithrombin III levels. Performed at [facility], [place] [address] [place], [place] [address]   Magnesium     Status: None   Collection Time: 12/11/21  4:44 PM  Result Value Ref Range   Magnesium 2.2 1.7 - 2.4 mg/dL    Comment: Performed at [facility] [address], [place], [place] [address]  Phosphorus  Status: Abnormal   Collection Time: 12/11/21  4:44 PM  Result Value Ref Range   Phosphorus <1.0 (LL) 2.5 - 4.6 mg/dL    Comment: CRITICAL RESULT CALLED TO, READ BACK BY AND VERIFIED WITH J,RIVERA RN '@1830'$  12/11/21 E,BENTON REPEATED TO VERIFY Performed at [facility] [address], [place], [place] [address]   Glucose, capillary     Status: Abnormal   Collection Time: 12/11/21  7:32 PM  Result Value Ref Range   Glucose-Capillary 133 (H) 70 - 99 mg/dL    Comment: Glucose reference range applies only to samples taken after fasting for at least 8 hours.  Glucose, capillary     Status: Abnormal   Collection Time: 12/11/21 11:21 PM  Result Value Ref Range   Glucose-Capillary 150 (H) 70 - 99 mg/dL    Comment: Glucose reference range applies only to samples taken after fasting for at least 8  hours.  Glucose, capillary     Status: Abnormal   Collection Time: 12/12/21  3:24 AM  Result Value Ref Range   Glucose-Capillary 163 (H) 70 - 99 mg/dL    Comment: Glucose reference range applies only to samples taken after fasting for at least 8 hours.  Procalcitonin     Status: None   Collection Time: 12/12/21  7:00 AM  Result Value Ref Range   Procalcitonin <0.10 ng/mL    Comment:        Interpretation: PCT (Procalcitonin) <= 0.5 ng/mL: Systemic infection (sepsis) is not likely. Local bacterial infection is possible. (NOTE)       Sepsis PCT Algorithm           Lower Respiratory Tract                                      Infection PCT Algorithm    ----------------------------     ----------------------------         PCT < 0.25 ng/mL                PCT < 0.10 ng/mL          Strongly encourage             Strongly discourage   discontinuation of antibiotics    initiation of antibiotics    ----------------------------     -----------------------------       PCT 0.25 - 0.50 ng/mL            PCT 0.10 - 0.25 ng/mL               OR       >80% decrease in PCT            Discourage initiation of                                            antibiotics      Encourage discontinuation           of antibiotics    ----------------------------     -----------------------------         PCT >= 0.50 ng/mL              PCT 0.26 - 0.50 ng/mL               AND        <  80% decrease in PCT             Encourage initiation of                                             antibiotics       Encourage continuation           of antibiotics    ----------------------------     -----------------------------        PCT >= 0.50 ng/mL                  PCT > 0.50 ng/mL               AND         increase in PCT                  Strongly encourage                                      initiation of antibiotics    Strongly encourage escalation           of antibiotics                                      -----------------------------                                           PCT <= 0.25 ng/mL                                                 OR                                        > 80% decrease in PCT                                      Discontinue / Do not initiate                                             antibiotics  Performed at [facility], [address] [place], [place] [address]   Heparin level (unfractionated)     Status: Abnormal   Collection Time: 12/12/21  7:00 AM  Result Value Ref Range   Heparin Unfractionated 0.23 (L) 0.30 - 0.70 IU/mL    Comment: (NOTE) The clinical reportable range upper limit is being lowered to >1.10 to align with the FDA approved guidance for the current laboratory assay.  If heparin results are below expected values, and patient dosage has  been confirmed, suggest follow up testing of antithrombin III levels. Performed at [facility], [place]  [address] [place], [place] [address]   Phosphorus     Status: None   Collection Time: 12/12/21  7:00 AM  Result Value Ref Range   Phosphorus 2.7 2.5 - 4.6 mg/dL    Comment: Performed at [facility], [place] [address] [place], [place] [address]  CBC     Status: Abnormal   Collection Time: 12/12/21  7:00 AM  Result Value Ref Range   WBC 3.8 (L) 4.0 - 10.5 K/uL   RBC 3.22 (L) 3.87 - 5.11 MIL/uL   Hemoglobin 8.9 (L) 12.0 - 15.0 g/dL   HCT 26.8 (L) 36.0 - 46.0 %   MCV 83.2 80.0 - 100.0 fL   MCH 27.6 26.0 - 34.0 pg   MCHC 33.2 30.0 - 36.0 g/dL   RDW 15.5 11.5 - 15.5 %   Platelets 178 150 - 400 K/uL   nRBC 0.0 0.0 - 0.2 %    Comment: Performed at [facility], [place] [address], [place], [place] [address]  Basic metabolic panel     Status: Abnormal   Collection Time: 12/12/21  7:00 AM  Result Value Ref Range   Sodium 147 (H) 135 - 145 mmol/L   Potassium 3.3 (L) 3.5 - 5.1 mmol/L   Chloride 115 (H) 98 - 111 mmol/L   CO2 23 22 - 32 mmol/L   Glucose, Bld 157 (H) 70 - 99 mg/dL     Comment: Glucose reference range applies only to samples taken after fasting for at least 8 hours.   BUN 13 6 - 20 mg/dL   Creatinine, Ser 0.35 (L) 0.44 - 1.00 mg/dL   Calcium 8.7 (L) 8.9 - 10.3 mg/dL   GFR, Estimated >60 >60 mL/min    Comment: (NOTE) Calculated using the CKD-EPI Creatinine Equation (2021)    Anion gap 9 5 - 15    Comment: Performed at [facility] [address], [place], [place] [address]  Vitamin A     Status: Abnormal   Collection Time: 12/12/21  7:00 AM  Result Value Ref Range   Vitamin A (Retinoic Acid) 9.0 (L) 20.1 - 62.0 ug/dL    Comment: (NOTE) Reference intervals for vitamin A determined from [facility] internal studies. Individuals with vitamin A less than 20 ug/dL are considered vitamin A deficient and those with serum concentrations less than 10 ug/dL are considered severely deficient. This test was developed and its performance characteristics determined by [facility]. It has not been cleared or approved by the Food and Drug Administration. Performed At: [facility] [address] [place], [place] [id number] Rush Farmer MD [id number] CORRECTED ON 11/24 AT 0846: PREVIOUSLY REPORTED AS See Scanned report in [place] ON 11/21 AT 1244: PREVIOUSLY REPORTED AS DUPLICATE   VITAMIN D 25 Hydroxy (Vit-D Deficiency, Fractures)     Status: None   Collection Time: 12/12/21  7:00 AM  Result Value Ref Range   Vit D, 25-Hydroxy 83.92 30 - 100 ng/mL    Comment: (NOTE) Vitamin D deficiency has been defined by the [place]  and an Endocrine Society practice guideline as a level of serum 25-OH  vitamin D less than 20 ng/mL (1,2). The Endocrine Society went on to  further define vitamin D insufficiency as a level between 21 and 29  ng/mL (2).  1. IOM (Institute of Medicine). 2010. Dietary reference intakes for  calcium and D. [place]: The [employer]. 2. Holick MF, Binkley Karnak, Bischoff-Ferrari HA,  et al. Evaluation,  treatment, and prevention of vitamin D deficiency: an Endocrine  Society clinical practice guideline, JCEM. 2011 Jul; 96(7): 1911-30.  Performed at [facility], [place] [address] [place], [place] [address]   Vitamin B12     Status: Abnormal   Collection Time: 12/12/21  7:00 AM  Result Value Ref Range   Vitamin B-12 1,183 (H) 180 - 914 pg/mL    Comment: (NOTE) This assay is not validated for testing neonatal or myeloproliferative syndrome specimens for Vitamin B12 levels. Performed at [facility], [place] [address], [place], [place] [address]   Zinc     Status: None   Collection Time: 12/12/21  7:00 AM  Result Value Ref Range   Zinc 60 44 - 115 ug/dL    Comment: (NOTE) This test was developed and its performance characteristics determined by [facility]. It has not been cleared or approved by the Food and Drug Administration.                                Detection Limit = 5 Performed At: [facility] [address] [place], [place] [id number] Rush Farmer MD [id number] CORRECTED ON 11/29 AT 7846: PREVIOUSLY REPORTED AS DUPLICATE Unit: ug/dL   Copper, serum     Status: None   Collection Time: 12/12/21  7:00 AM  Result Value Ref Range   Copper 105 80 - 158 ug/dL    Comment: (NOTE) This test was developed and its performance characteristics determined by [facility]. It has not been cleared or approved by the Food and Drug Administration.                                Detection Limit = 5 Performed At: [facility] [address] [place], [place] [id number] Rush Farmer MD [id number] CORRECTED ON 11/29 AT 0738: PREVIOUSLY REPORTED AS DUPLICATE Unit: ug/dL   C-reactive protein     Status: Abnormal   Collection Time: 12/12/21  7:00 AM  Result Value Ref Range   CRP 1.6 (H) <1.0 mg/dL    Comment: Performed at [facility], [place] [address], [place],  [address]  Vitamin E     Status: Abnormal   Collection Time:  12/12/21  7:00 AM  Result Value Ref Range   Vitamin E (Alpha Tocopherol) 6.5 (L) 7.0 - 25.1 mg/L    Comment: (NOTE) This test was developed and its performance characteristics determined by [facility]. It has not been cleared or approved by the Food and Drug Administration. CORRECTED ON 11/24 AT 0846: PREVIOUSLY REPORTED AS See Scanned report in [place] Unit: mg/L, CORRECTED ON 11/21 AT 1244: PREVIOUSLY REPORTED AS DUPLICATE Unit: mg/L    Vitamin E(Gamma Tocopherol) 0.5 0.5 - 5.5 mg/L    Comment: (NOTE) This test was developed and its performance characteristics determined by [facility]. It has not been cleared or approved by the Food and Drug Administration. Reference intervals for alpha and gamma-tocopherol determined from [facility] and Nutrition Examination Survey, 2005-2006. Individuals with alpha-tocopherol levels less than 5.0 mg/L are considered vitamin E deficient. Performed At: [facility] [place], [place] [id number] Rush Farmer MD [id number]   Magnesium     Status: None   Collection Time: 12/12/21  7:00 AM  Result Value Ref Range   Magnesium 2.1 1.7 - 2.4 mg/dL    Comment: Performed at [facility]  [facility], [place] [address], [place], [place] [address]  Miscellaneous [facility] test (send-out)     Status: None   Collection Time: 12/12/21  7:00 AM  Result Value Ref Range   [facility] test code [id number]    [facility] test name Vitamin B6    Misc [facility] result See Scanned report in [place]: Performed at [facility], [place] [address] [place], [place] [address]  Glucose, capillary     Status: Abnormal   Collection Time: 12/12/21  7:54 AM  Result Value Ref Range   Glucose-Capillary 145 (H) 70 - 99 mg/dL    Comment: Glucose reference range applies only to samples taken after fasting for at least 8 hours.   Comment 1 Notify RN   Glucose, capillary     Status: Abnormal   Collection Time: 12/12/21 11:30 AM  Result Value Ref  Range   Glucose-Capillary 179 (H) 70 - 99 mg/dL    Comment: Glucose reference range applies only to samples taken after fasting for at least 8 hours.   Comment 1 Notify RN   Heparin level (unfractionated)     Status: None   Collection Time: 12/12/21  2:49 PM  Result Value Ref Range   Heparin Unfractionated 0.32 0.30 - 0.70 IU/mL    Comment: (NOTE) The clinical reportable range upper limit is being lowered to >1.10 to align with the FDA approved guidance for the current laboratory assay.  If heparin results are below expected values, and patient dosage has  been confirmed, suggest follow up testing of antithrombin III levels. Performed at [facility], [place] [address] [place], [place] [address]   Glucose, capillary     Status: Abnormal   Collection Time: 12/12/21  3:17 PM  Result Value Ref Range   Glucose-Capillary 135 (H) 70 - 99 mg/dL    Comment: Glucose reference range applies only to samples taken after fasting for at least 8 hours.   Comment 1 Notify RN   Magnesium     Status: None   Collection Time: 12/12/21  6:24 PM  Result Value Ref Range   Magnesium 2.1 1.7 - 2.4 mg/dL    Comment: Performed at [facility], [place] [address], [place], [place] [address]  Phosphorus     Status: Abnormal   Collection Time: 12/12/21  6:24 PM  Result Value Ref Range   Phosphorus 2.4 (L) 2.5 - 4.6 mg/dL    Comment: Performed at [facility] [address], [place], [place] [address]  Glucose, capillary     Status: Abnormal   Collection Time: 12/12/21  7:34 PM  Result Value Ref Range   Glucose-Capillary 130 (H) 70 - 99 mg/dL    Comment: Glucose reference range applies only to samples taken after fasting for at least 8 hours.  Heparin level (unfractionated)     Status: None   Collection Time: 12/12/21  8:38 PM  Result Value Ref Range   Heparin Unfractionated 0.35 0.30 - 0.70 IU/mL    Comment: (NOTE) The clinical reportable range upper limit is being lowered to >1.10 to  align with the FDA approved guidance for the current laboratory assay.  If heparin results are below expected values, and patient dosage has  been confirmed, suggest follow up testing of antithrombin III levels. Performed at [facility], [place] [address] [place], [place] [address]   Glucose, capillary     Status: Abnormal   Collection  Time: 12/12/21 11:22 PM  Result Value Ref Range   Glucose-Capillary 165 (H) 70 - 99 mg/dL    Comment: Glucose reference range applies only to samples taken after fasting for at least 8 hours.  Glucose, capillary     Status: Abnormal   Collection Time: 12/13/21  3:20 AM  Result Value Ref Range   Glucose-Capillary 184 (H) 70 - 99 mg/dL    Comment: Glucose reference range applies only to samples taken after fasting for at least 8 hours.  Heparin level (unfractionated)     Status: Abnormal   Collection Time: 12/13/21  5:24 AM  Result Value Ref Range   Heparin Unfractionated 0.25 (L) 0.30 - 0.70 IU/mL    Comment: (NOTE) The clinical reportable range upper limit is being lowered to >1.10 to align with the FDA approved guidance for the current laboratory assay.  If heparin results are below expected values, and patient dosage has  been confirmed, suggest follow up testing of antithrombin III levels. Performed at [facility], [place] [address] [place], [place] [address]   Magnesium     Status: None   Collection Time: 12/13/21  5:24 AM  Result Value Ref Range   Magnesium 2.0 1.7 - 2.4 mg/dL    Comment: Performed at [facility] [address], [place], [place] [address]  Phosphorus     Status: None   Collection Time: 12/13/21  5:24 AM  Result Value Ref Range   Phosphorus 3.4 2.5 - 4.6 mg/dL    Comment: Performed at [facility] [address], [place], [place] [address]  CBC     Status: Abnormal   Collection Time: 12/13/21  5:24 AM  Result Value Ref Range   WBC 3.7 (L) 4.0 - 10.5 K/uL   RBC 3.32 (L) 3.87 - 5.11 MIL/uL    Hemoglobin 9.4 (L) 12.0 - 15.0 g/dL   HCT 28.3 (L) 36.0 - 46.0 %   MCV 85.2 80.0 - 100.0 fL   MCH 28.3 26.0 - 34.0 pg   MCHC 33.2 30.0 - 36.0 g/dL   RDW 15.8 (H) 11.5 - 15.5 %   Platelets 168 150 - 400 K/uL   nRBC 0.0 0.0 - 0.2 %    Comment: Performed at [facility], [place] [address], [place], [place] [address]  Basic metabolic panel     Status: Abnormal   Collection Time: 12/13/21  5:24 AM  Result Value Ref Range   Sodium 144 135 - 145 mmol/L   Potassium 3.6 3.5 - 5.1 mmol/L   Chloride 107 98 - 111 mmol/L   CO2 29 22 - 32 mmol/L   Glucose, Bld 184 (H) 70 - 99 mg/dL    Comment: Glucose reference range applies only to samples taken after fasting for at least 8 hours.   BUN 16 6 - 20 mg/dL   Creatinine, Ser 0.36 (L) 0.44 - 1.00 mg/dL   Calcium 8.7 (L) 8.9 - 10.3 mg/dL   GFR, Estimated >60 >60 mL/min    Comment: (NOTE) Calculated using the CKD-EPI Creatinine Equation (2021)    Anion gap 8 5 - 15    Comment: Performed at [facility] [address], [place], [place] [address]  Glucose, capillary     Status: Abnormal   Collection Time: 12/13/21  7:27 AM  Result Value Ref Range   Glucose-Capillary 168 (H) 70 - 99 mg/dL    Comment: Glucose reference range applies only to samples taken  after fasting for at least 8 hours.  Glucose, capillary     Status: Abnormal   Collection Time: 12/13/21 11:29 AM  Result Value Ref Range   Glucose-Capillary 259 (H) 70 - 99 mg/dL    Comment: Glucose reference range applies only to samples taken after fasting for at least 8 hours.  Magnesium     Status: None   Collection Time: 12/13/21  2:37 PM  Result Value Ref Range   Magnesium 1.9 1.7 - 2.4 mg/dL    Comment: Performed at [facility], [place] [address] [place], [place] [address]  Phosphorus     Status: None   Collection Time: 12/13/21  2:37 PM  Result Value Ref Range   Phosphorus 3.3 2.5 - 4.6 mg/dL    Comment: Performed at [facility], [place] [address] [place],  [place] [address]  Heparin level (unfractionated)     Status: None   Collection Time: 12/13/21  2:37 PM  Result Value Ref Range   Heparin Unfractionated 0.37 0.30 - 0.70 IU/mL    Comment: (NOTE) The clinical reportable range upper limit is being lowered to >1.10 to align with the FDA approved guidance for the current laboratory assay.  If heparin results are below expected values, and patient dosage has  been confirmed, suggest follow up testing of antithrombin III levels. Performed at [facility], [place] [address], [place], [place] [address]   Glucose, capillary     Status: Abnormal   Collection Time: 12/13/21  4:43 PM  Result Value Ref Range   Glucose-Capillary 263 (H) 70 - 99 mg/dL    Comment: Glucose reference range applies only to samples taken after fasting for at least 8 hours.  Glucose, capillary     Status: Abnormal   Collection Time: 12/13/21  7:30 PM  Result Value Ref Range   Glucose-Capillary 177 (H) 70 - 99 mg/dL    Comment: Glucose reference range applies only to samples taken after fasting for at least 8 hours.  Heparin level (unfractionated)     Status: None   Collection Time: 12/14/21  5:10 AM  Result Value Ref Range   Heparin Unfractionated 0.35 0.30 - 0.70 IU/mL    Comment: (NOTE) The clinical reportable range upper limit is being lowered to >1.10 to align with the FDA approved guidance for the current laboratory assay.  If heparin results are below expected values, and patient dosage has  been confirmed, suggest follow up testing of antithrombin III levels. Performed at [facility], [place] [address] [place], [place] [address]   Magnesium     Status: None   Collection Time: 12/14/21  5:10 AM  Result Value Ref Range   Magnesium 1.9 1.7 - 2.4 mg/dL    Comment: Performed at [facility] [address], [place], [place] [address]  Phosphorus     Status: None   Collection Time: 12/14/21  5:10 AM  Result Value Ref Range   Phosphorus 3.2 2.5 - 4.6  mg/dL    Comment: Performed at [facility] [address], [place], [place] [address]  Comprehensive metabolic panel     Status: Abnormal   Collection Time: 12/14/21  5:10 AM  Result Value Ref Range   Sodium 140 135 - 145 mmol/L   Potassium 3.6 3.5 - 5.1 mmol/L   Chloride 101 98 - 111 mmol/L   CO2 31 22 - 32 mmol/L   Glucose, Bld 174 (H) 70 - 99  mg/dL    Comment: Glucose reference range applies only to samples taken after fasting for at least 8 hours.   BUN 16 6 - 20 mg/dL   Creatinine, Ser 0.47 0.44 - 1.00 mg/dL   Calcium 8.8 (L) 8.9 - 10.3 mg/dL   Total Protein 5.2 (L) 6.5 - 8.1 g/dL   Albumin 2.2 (L) 3.5 - 5.0 g/dL   AST 14 (L) 15 - 41 U/L   ALT 10 0 - 44 U/L   Alkaline Phosphatase 48 38 - 126 U/L   Total Bilirubin 0.2 (L) 0.3 - 1.2 mg/dL   GFR, Estimated >60 >60 mL/min    Comment: (NOTE) Calculated using the CKD-EPI Creatinine Equation (2021)    Anion gap 8 5 - 15    Comment: Performed at [facility], [place] [address] [place], [place] [address]  CBC     Status: Abnormal   Collection Time: 12/14/21  5:10 AM  Result Value Ref Range   WBC 3.9 (L) 4.0 - 10.5 K/uL   RBC 3.22 (L) 3.87 - 5.11 MIL/uL   Hemoglobin 8.9 (L) 12.0 - 15.0 g/dL   HCT 28.3 (L) 36.0 - 46.0 %   MCV 87.9 80.0 - 100.0 fL   MCH 27.6 26.0 - 34.0 pg   MCHC 31.4 30.0 - 36.0 g/dL   RDW 15.5 11.5 - 15.5 %   Platelets 169 150 - 400 K/uL   nRBC 0.0 0.0 - 0.2 %    Comment: Performed at [facility], [place] [address] [place], [place] [address]  Glucose, capillary     Status: Abnormal   Collection Time: 12/14/21  7:35 AM  Result Value Ref Range   Glucose-Capillary 136 (H) 70 - 99 mg/dL    Comment: Glucose reference range applies only to samples taken after fasting for at least 8 hours.  Glucose, capillary     Status: None   Collection Time: 12/14/21 11:08 AM  Result Value Ref Range   Glucose-Capillary 84 70 - 99 mg/dL    Comment: Glucose reference range applies only to samples taken after  fasting for at least 8 hours.  Surgical pcr screen     Status: None   Collection Time: 12/14/21 12:14 PM   Specimen: Nasal Mucosa; Nasal Swab  Result Value Ref Range   MRSA, PCR NEGATIVE NEGATIVE   Staphylococcus aureus NEGATIVE NEGATIVE    Comment: (NOTE) The Xpert SA Assay (FDA approved for NASAL specimens in patients 68 years of age and older), is one component of a comprehensive surveillance program. It is not intended to diagnose infection nor to guide or monitor treatment. Performed at [facility], [place] [address] [place], [place] [address]   Glucose, capillary     Status: None   Collection Time: 12/14/21  2:10 PM  Result Value Ref Range   Glucose-Capillary 82 70 - 99 mg/dL    Comment: Glucose reference range applies only to samples taken after fasting for at least 8 hours.  Type and screen [place]     Status: None   Collection Time: 12/14/21  4:05 PM  Result Value Ref Range   ABO/RH(D) A POS    Antibody Screen NEG    Sample Expiration      12/17/2021,2359 Performed at [facility], [place] [address], [place], [place] [address]   ABO/Rh     Status: None   Collection Time: 12/14/21  4:10 PM  Result Value Ref Range  ABO/RH(D)      A POS Performed at [facility], [place] [address] [place], [place] [address]   Glucose, capillary     Status: Abnormal   Collection Time: 12/14/21  4:12 PM  Result Value Ref Range   Glucose-Capillary 117 (H) 70 - 99 mg/dL    Comment: Glucose reference range applies only to samples taken after fasting for at least 8 hours.  Glucose, capillary     Status: Abnormal   Collection Time: 12/14/21  6:07 PM  Result Value Ref Range   Glucose-Capillary 144 (H) 70 - 99 mg/dL    Comment: Glucose reference range applies only to samples taken after fasting for at least 8 hours.  Glucose, capillary     Status: Abnormal   Collection Time: 12/14/21  8:29 PM  Result Value Ref Range   Glucose-Capillary 162 (H) 70 - 99  mg/dL    Comment: Glucose reference range applies only to samples taken after fasting for at least 8 hours.  Glucose, capillary     Status: Abnormal   Collection Time: 12/14/21 11:11 PM  Result Value Ref Range   Glucose-Capillary 355 (H) 70 - 99 mg/dL    Comment: Glucose reference range applies only to samples taken after fasting for at least 8 hours.  Heparin level (unfractionated)     Status: Abnormal   Collection Time: 12/15/21  2:04 AM  Result Value Ref Range   Heparin Unfractionated <0.10 (L) 0.30 - 0.70 IU/mL    Comment: (NOTE) The clinical reportable range upper limit is being lowered to >1.10 to align with the FDA approved guidance for the current laboratory assay.  If heparin results are below expected values, and patient dosage has  been confirmed, suggest follow up testing of antithrombin III levels. Performed at [facility], [place] [address], [place], [place] [address]   Basic metabolic panel     Status: Abnormal   Collection Time: 12/15/21  2:04 AM  Result Value Ref Range   Sodium 135 135 - 145 mmol/L   Potassium 3.8 3.5 - 5.1 mmol/L   Chloride 94 (L) 98 - 111 mmol/L   CO2 24 22 - 32 mmol/L   Glucose, Bld 478 (H) 70 - 99 mg/dL    Comment: Glucose reference range applies only to samples taken after fasting for at least 8 hours.   BUN 15 6 - 20 mg/dL   Creatinine, Ser 0.85 0.44 - 1.00 mg/dL   Calcium 8.8 (L) 8.9 - 10.3 mg/dL   GFR, Estimated >60 >60 mL/min    Comment: (NOTE) Calculated using the CKD-EPI Creatinine Equation (2021)    Anion gap 17 (H) 5 - 15    Comment: Performed at [facility] [address] [place], [place] [address]  CBC     Status: Abnormal   Collection Time: 12/15/21  2:04 AM  Result Value Ref Range   WBC 7.8 4.0 - 10.5 K/uL   RBC 3.57 (L) 3.87 - 5.11 MIL/uL   Hemoglobin 9.9 (L) 12.0 - 15.0 g/dL   HCT 31.5 (L) 36.0 - 46.0 %   MCV 88.2 80.0 - 100.0 fL   MCH 27.7 26.0 - 34.0 pg   MCHC 31.4 30.0 - 36.0 g/dL   RDW 15.2 11.5 -  15.5 %   Platelets 222 150 - 400 K/uL   nRBC 0.0 0.0 - 0.2 %    Comment: Performed at [facility], [place] [address] [place], [place] [address]  Glucose, capillary     Status: Abnormal   Collection Time: 12/15/21  7:31 AM  Result Value Ref Range   Glucose-Capillary 362 (H) 70 - 99 mg/dL    Comment: Glucose reference range applies only to samples taken after fasting for at least 8 hours.  Glucose, capillary     Status: Abnormal   Collection Time: 12/15/21  9:50 AM  Result Value Ref Range   Glucose-Capillary 362 (H) 70 - 99 mg/dL    Comment: Glucose reference range applies only to samples taken after fasting for at least 8 hours.  Glucose, capillary     Status: Abnormal   Collection Time: 12/15/21 11:28 AM  Result Value Ref Range   Glucose-Capillary 355 (H) 70 - 99 mg/dL    Comment: Glucose reference range applies only to samples taken after fasting for at least 8 hours.  Glucose, capillary     Status: None   Collection Time: 12/15/21  4:49 PM  Result Value Ref Range   Glucose-Capillary 90 70 - 99 mg/dL    Comment: Glucose reference range applies only to samples taken after fasting for at least 8 hours.  Glucose, capillary     Status: None   Collection Time: 12/15/21  7:40 PM  Result Value Ref Range   Glucose-Capillary 89 70 - 99 mg/dL    Comment: Glucose reference range applies only to samples taken after fasting for at least 8 hours.  Basic metabolic panel     Status: Abnormal   Collection Time: 12/16/21  4:33 AM  Result Value Ref Range   Sodium 138 135 - 145 mmol/L   Potassium 3.0 (L) 3.5 - 5.1 mmol/L   Chloride 101 98 - 111 mmol/L   CO2 31 22 - 32 mmol/L   Glucose, Bld 177 (H) 70 - 99 mg/dL    Comment: Glucose reference range applies only to samples taken after fasting for at least 8 hours.   BUN 10 6 - 20 mg/dL   Creatinine, Ser 0.39 (L) 0.44 - 1.00 mg/dL   Calcium 8.8 (L) 8.9 - 10.3 mg/dL   GFR, Estimated >60 >60 mL/min    Comment: (NOTE) Calculated using the  CKD-EPI Creatinine Equation (2021)    Anion gap 6 5 - 15    Comment: Performed at [facility] [address], [place], [place] [address]  CBC     Status: Abnormal   Collection Time: 12/16/21  4:33 AM  Result Value Ref Range   WBC 6.1 4.0 - 10.5 K/uL   RBC 3.02 (L) 3.87 - 5.11 MIL/uL   Hemoglobin 8.2 (L) 12.0 - 15.0 g/dL   HCT 26.7 (L) 36.0 - 46.0 %   MCV 88.4 80.0 - 100.0 fL   MCH 27.2 26.0 - 34.0 pg   MCHC 30.7 30.0 - 36.0 g/dL   RDW 15.3 11.5 - 15.5 %   Platelets 224 150 - 400 K/uL   nRBC 0.0 0.0 - 0.2 %    Comment: Performed at [facility], [place] [address], [place], [place] [address]  Glucose, capillary     Status: Abnormal   Collection Time: 12/16/21  8:13 AM  Result Value Ref Range   Glucose-Capillary 216 (H) 70 - 99 mg/dL    Comment: Glucose reference range applies only to samples taken after fasting for at least 8 hours.  Glucose, capillary     Status: Abnormal   Collection Time: 12/16/21 11:51 AM  Result Value Ref Range   Glucose-Capillary 124 (  H) 70 - 99 mg/dL    Comment: Glucose reference range applies only to samples taken after fasting for at least 8 hours.  Glucose, capillary     Status: Abnormal   Collection Time: 12/16/21  5:17 PM  Result Value Ref Range   Glucose-Capillary 141 (H) 70 - 99 mg/dL    Comment: Glucose reference range applies only to samples taken after fasting for at least 8 hours.  Glucose, capillary     Status: Abnormal   Collection Time: 12/16/21  8:13 PM  Result Value Ref Range   Glucose-Capillary 145 (H) 70 - 99 mg/dL    Comment: Glucose reference range applies only to samples taken after fasting for at least 8 hours.  Basic metabolic panel     Status: Abnormal   Collection Time: 12/17/21  2:09 AM  Result Value Ref Range   Sodium 134 (L) 135 - 145 mmol/L   Potassium 3.0 (L) 3.5 - 5.1 mmol/L   Chloride 92 (L) 98 - 111 mmol/L   CO2 30 22 - 32 mmol/L   Glucose, Bld 298 (H) 70 - 99 mg/dL    Comment: Glucose reference range  applies only to samples taken after fasting for at least 8 hours.   BUN 12 6 - 20 mg/dL   Creatinine, Ser 0.47 0.44 - 1.00 mg/dL   Calcium 8.8 (L) 8.9 - 10.3 mg/dL   GFR, Estimated >60 >60 mL/min    Comment: (NOTE) Calculated using the CKD-EPI Creatinine Equation (2021)    Anion gap 12 5 - 15    Comment: Performed at [facility] [address], [place], [place] [address]  CBC     Status: Abnormal   Collection Time: 12/17/21  2:09 AM  Result Value Ref Range   WBC 5.4 4.0 - 10.5 K/uL   RBC 2.94 (L) 3.87 - 5.11 MIL/uL   Hemoglobin 8.4 (L) 12.0 - 15.0 g/dL   HCT 25.6 (L) 36.0 - 46.0 %   MCV 87.1 80.0 - 100.0 fL   MCH 28.6 26.0 - 34.0 pg   MCHC 32.8 30.0 - 36.0 g/dL   RDW 15.1 11.5 - 15.5 %   Platelets 232 150 - 400 K/uL   nRBC 0.0 0.0 - 0.2 %    Comment: Performed at [facility], [place] [address] [place], [place] [address]  Phosphorus     Status: None   Collection Time: 12/17/21  2:09 AM  Result Value Ref Range   Phosphorus 2.9 2.5 - 4.6 mg/dL    Comment: Performed at [facility] [address] [place], [place] [address]  Magnesium     Status: None   Collection Time: 12/17/21  2:09 AM  Result Value Ref Range   Magnesium 1.8 1.7 - 2.4 mg/dL    Comment: Performed at [facility] [address] [place], [place] [address]  Glucose, capillary     Status: Abnormal   Collection Time: 12/17/21  7:27 AM  Result Value Ref Range   Glucose-Capillary 203 (H) 70 - 99 mg/dL    Comment: Glucose reference range applies only to samples taken after fasting for at least 8 hours.  Glucose, capillary     Status: Abnormal   Collection Time: 12/17/21 11:13 AM  Result Value Ref Range   Glucose-Capillary 100 (H) 70 - 99 mg/dL    Comment: Glucose reference range applies only to samples taken after fasting for at least 8 hours.  Glucose, capillary  Status: Abnormal   Collection Time: 12/17/21  3:45 PM  Result Value Ref Range   Glucose-Capillary 228 (H) 70 - 99 mg/dL     Comment: Glucose reference range applies only to samples taken after fasting for at least 8 hours.  Glucose, capillary     Status: Abnormal   Collection Time: 12/17/21  5:13 PM  Result Value Ref Range   Glucose-Capillary 277 (H) 70 - 99 mg/dL    Comment: Glucose reference range applies only to samples taken after fasting for at least 8 hours.  Glucose, capillary     Status: Abnormal   Collection Time: 12/17/21  7:17 PM  Result Value Ref Range   Glucose-Capillary 251 (H) 70 - 99 mg/dL    Comment: Glucose reference range applies only to samples taken after fasting for at least 8 hours.  Basic metabolic panel     Status: Abnormal   Collection Time: 12/18/21  2:53 AM  Result Value Ref Range   Sodium 138 135 - 145 mmol/L   Potassium 3.6 3.5 - 5.1 mmol/L   Chloride 95 (L) 98 - 111 mmol/L   CO2 33 (H) 22 - 32 mmol/L   Glucose, Bld 318 (H) 70 - 99 mg/dL    Comment: Glucose reference range applies only to samples taken after fasting for at least 8 hours.   BUN 6 6 - 20 mg/dL   Creatinine, Ser 0.59 0.44 - 1.00 mg/dL   Calcium 8.9 8.9 - 10.3 mg/dL   GFR, Estimated >60 >60 mL/min    Comment: (NOTE) Calculated using the CKD-EPI Creatinine Equation (2021)    Anion gap 10 5 - 15    Comment: Performed at [facility] [address], [place], [place] [address]  Glucose, capillary     Status: Abnormal   Collection Time: 12/18/21  7:34 AM  Result Value Ref Range   Glucose-Capillary 246 (H) 70 - 99 mg/dL    Comment: Glucose reference range applies only to samples taken after fasting for at least 8 hours.  Glucose, capillary     Status: Abnormal   Collection Time: 12/18/21  1:02 PM  Result Value Ref Range   Glucose-Capillary 294 (H) 70 - 99 mg/dL    Comment: Glucose reference range applies only to samples taken after fasting for at least 8 hours.    Radiology No results found.  Assessment/Plan  Hypertension blood pressure control important in reducing the progression of  atherosclerotic disease. On appropriate oral medications.   Ischemic cardiomyopathy Poor cardiac function will worsen the durability lower extremity interventions.  Controlled type 2 diabetes mellitus with complication, with long-term current use of insulin (HCC) blood glucose control important in reducing the progression of atherosclerotic disease. Also, involved in wound healing. On appropriate medications.   Atherosclerosis of artery of extremity with rest pain ([place]) Noninvasive studies today show normal arterial flow bilaterally with ABIs as of 1.17 on the right and 1.12 on the left with biphasic waveforms with digit pressures of 109 on the right and 113 on the left.  Perfusion is currently doing well.  Wounds have markedly improved.  Overall doing good from a vascular standpoint.  Continue current medical regimen.  Recheck in 6 months with noninvasive studies.    Leotis Pain, MD  02/02/2022 3:09 PM    This note was created with Dragon medical transcription system.  Any errors from dictation are purely unintentional

## 2022-02-02 NOTE — Assessment & Plan Note (Signed)
Poor cardiac function will worsen the durability lower extremity interventions.

## 2022-02-02 NOTE — Assessment & Plan Note (Signed)
blood glucose control important in reducing the progression of atherosclerotic disease. Also, involved in wound healing. On appropriate medications.  

## 2022-02-03 LAB — VAS US ABI WITH/WO TBI
Left ABI: 1.12
Right ABI: 1.17

## 2022-03-19 ENCOUNTER — Encounter: Payer: Self-pay | Admitting: Family Medicine

## 2022-03-19 ENCOUNTER — Emergency Department: Payer: Medicaid Other

## 2022-03-19 ENCOUNTER — Other Ambulatory Visit: Payer: Self-pay

## 2022-03-19 ENCOUNTER — Inpatient Hospital Stay
Admission: EM | Admit: 2022-03-19 | Discharge: 2022-03-23 | DRG: 690 | Disposition: A | Discharge status: Home Health | Payer: Medicaid Other | Attending: Internal Medicine | Admitting: Internal Medicine

## 2022-03-19 DIAGNOSIS — E1151 Type 2 diabetes mellitus with diabetic peripheral angiopathy without gangrene: Secondary | ICD-10-CM | POA: Diagnosis present

## 2022-03-19 DIAGNOSIS — Z7982 Long term (current) use of aspirin: Secondary | ICD-10-CM

## 2022-03-19 DIAGNOSIS — E861 Hypovolemia: Secondary | ICD-10-CM

## 2022-03-19 DIAGNOSIS — I10 Essential (primary) hypertension: Secondary | ICD-10-CM | POA: Diagnosis present

## 2022-03-19 DIAGNOSIS — R531 Weakness: Secondary | ICD-10-CM

## 2022-03-19 DIAGNOSIS — Z993 Dependence on wheelchair: Secondary | ICD-10-CM

## 2022-03-19 DIAGNOSIS — R54 Age-related physical debility: Secondary | ICD-10-CM | POA: Diagnosis present

## 2022-03-19 DIAGNOSIS — I2510 Atherosclerotic heart disease of native coronary artery without angina pectoris: Secondary | ICD-10-CM | POA: Diagnosis present

## 2022-03-19 DIAGNOSIS — K219 Gastro-esophageal reflux disease without esophagitis: Secondary | ICD-10-CM | POA: Insufficient documentation

## 2022-03-19 DIAGNOSIS — Z7901 Long term (current) use of anticoagulants: Secondary | ICD-10-CM

## 2022-03-19 DIAGNOSIS — Z9861 Coronary angioplasty status: Secondary | ICD-10-CM

## 2022-03-19 DIAGNOSIS — I9589 Other hypotension: Secondary | ICD-10-CM

## 2022-03-19 DIAGNOSIS — E785 Hyperlipidemia, unspecified: Secondary | ICD-10-CM | POA: Insufficient documentation

## 2022-03-19 DIAGNOSIS — Z87891 Personal history of nicotine dependence: Secondary | ICD-10-CM

## 2022-03-19 DIAGNOSIS — M79605 Pain in left leg: Secondary | ICD-10-CM | POA: Diagnosis present

## 2022-03-19 DIAGNOSIS — I255 Ischemic cardiomyopathy: Secondary | ICD-10-CM | POA: Diagnosis present

## 2022-03-19 DIAGNOSIS — Z8249 Family history of ischemic heart disease and other diseases of the circulatory system: Secondary | ICD-10-CM

## 2022-03-19 DIAGNOSIS — Z79899 Other long term (current) drug therapy: Secondary | ICD-10-CM

## 2022-03-19 DIAGNOSIS — Z7984 Long term (current) use of oral hypoglycemic drugs: Secondary | ICD-10-CM

## 2022-03-19 DIAGNOSIS — E860 Dehydration: Secondary | ICD-10-CM

## 2022-03-19 DIAGNOSIS — Z955 Presence of coronary angioplasty implant and graft: Secondary | ICD-10-CM

## 2022-03-19 DIAGNOSIS — R059 Cough, unspecified: Secondary | ICD-10-CM | POA: Diagnosis present

## 2022-03-19 DIAGNOSIS — I5022 Chronic systolic (congestive) heart failure: Secondary | ICD-10-CM | POA: Diagnosis present

## 2022-03-19 DIAGNOSIS — D649 Anemia, unspecified: Secondary | ICD-10-CM | POA: Diagnosis present

## 2022-03-19 DIAGNOSIS — E1165 Type 2 diabetes mellitus with hyperglycemia: Secondary | ICD-10-CM | POA: Diagnosis present

## 2022-03-19 DIAGNOSIS — E119 Type 2 diabetes mellitus without complications: Secondary | ICD-10-CM

## 2022-03-19 DIAGNOSIS — M6281 Muscle weakness (generalized): Secondary | ICD-10-CM | POA: Diagnosis present

## 2022-03-19 DIAGNOSIS — E876 Hypokalemia: Secondary | ICD-10-CM

## 2022-03-19 DIAGNOSIS — M79604 Pain in right leg: Secondary | ICD-10-CM | POA: Diagnosis present

## 2022-03-19 DIAGNOSIS — N390 Urinary tract infection, site not specified: Secondary | ICD-10-CM | POA: Diagnosis not present

## 2022-03-19 DIAGNOSIS — Z794 Long term (current) use of insulin: Secondary | ICD-10-CM

## 2022-03-19 DIAGNOSIS — L89152 Pressure ulcer of sacral region, stage 2: Secondary | ICD-10-CM | POA: Diagnosis present

## 2022-03-19 DIAGNOSIS — I110 Hypertensive heart disease with heart failure: Secondary | ICD-10-CM | POA: Diagnosis present

## 2022-03-19 DIAGNOSIS — I959 Hypotension, unspecified: Secondary | ICD-10-CM | POA: Diagnosis present

## 2022-03-19 LAB — URINALYSIS, COMPLETE (UACMP) WITH MICROSCOPIC
Bilirubin Urine: NEGATIVE
Glucose, UA: >=500 mg/dL — AB
Ketones, ur: 20 mg/dL — AB
Nitrite: NEGATIVE
Protein, ur: NEGATIVE mg/dL
Specific Gravity, Urine: 1.027 (ref 1.005–1.030)
WBC, UA: >50 WBC/hpf (ref 0–5)
pH: 6 (ref 5.0–8.0)

## 2022-03-19 LAB — CBC WITH DIFFERENTIAL/PLATELET
Abs Immature Granulocytes: 0.02 10*3/uL (ref 0.00–0.07)
Basophils Absolute: 0 10*3/uL (ref 0.0–0.1)
Basophils Relative: 1 %
Eosinophils Absolute: 0.1 10*3/uL (ref 0.0–0.5)
Eosinophils Relative: 1 %
HCT: 33.1 % — ABNORMAL LOW (ref 36.0–46.0)
Hemoglobin: 10.3 g/dL — ABNORMAL LOW (ref 12.0–15.0)
Immature Granulocytes: 0 %
Lymphocytes Relative: 18 %
Lymphs Abs: 1.3 10*3/uL (ref 0.7–4.0)
MCH: 26.2 pg (ref 26.0–34.0)
MCHC: 31.1 g/dL (ref 30.0–36.0)
MCV: 84.2 fL (ref 80.0–100.0)
Monocytes Absolute: 0.3 10*3/uL (ref 0.1–1.0)
Monocytes Relative: 5 %
Neutro Abs: 5.4 10*3/uL (ref 1.7–7.7)
Neutrophils Relative %: 75 %
Platelets: 395 10*3/uL (ref 150–400)
RBC: 3.93 MIL/uL (ref 3.87–5.11)
RDW: 15.1 % (ref 11.5–15.5)
WBC: 7.2 10*3/uL (ref 4.0–10.5)
nRBC: 0 % (ref 0.0–0.2)

## 2022-03-19 LAB — COMPREHENSIVE METABOLIC PANEL
ALT: 9 U/L (ref 0–44)
AST: 17 U/L (ref 15–41)
Albumin: 2.7 g/dL — ABNORMAL LOW (ref 3.5–5.0)
Alkaline Phosphatase: 96 U/L (ref 38–126)
Anion gap: 14 (ref 5–15)
BUN: 11 mg/dL (ref 6–20)
CO2: 25 mmol/L (ref 22–32)
Calcium: 8.8 mg/dL — ABNORMAL LOW (ref 8.9–10.3)
Chloride: 98 mmol/L (ref 98–111)
Creatinine, Ser: 0.62 mg/dL (ref 0.44–1.00)
GFR, Estimated: >60 mL/min (ref >60)
Glucose, Bld: 227 mg/dL — ABNORMAL HIGH (ref 70–99)
Potassium: 2.9 mmol/L — ABNORMAL LOW (ref 3.5–5.1)
Sodium: 137 mmol/L (ref 135–145)
Total Bilirubin: 0.8 mg/dL (ref 0.3–1.2)
Total Protein: 6.7 g/dL (ref 6.5–8.1)

## 2022-03-19 LAB — TROPONIN I (HIGH SENSITIVITY)
Troponin I (High Sensitivity): 9 ng/L (ref <18)
Troponin I (High Sensitivity): 8 ng/L (ref <18)

## 2022-03-19 LAB — LACTIC ACID, PLASMA: Lactic Acid, Venous: 1.5 mmol/L (ref 0.5–1.9)

## 2022-03-19 LAB — APTT: aPTT: 33 seconds (ref 24–36)

## 2022-03-19 MED ORDER — POTASSIUM CHLORIDE IN NACL 20-0.9 MEQ/L-% IV SOLN
INTRAVENOUS | Status: DC
  Filled 2022-03-19 (×6): qty 1000

## 2022-03-19 MED ORDER — SODIUM CHLORIDE 0.9 % IV SOLN
1.0000 g | Freq: Once | INTRAVENOUS | Status: AC
  Administered 2022-03-19: 1 g via INTRAVENOUS
  Filled 2022-03-19: qty 10

## 2022-03-19 MED ORDER — INSULIN ASPART 100 UNIT/ML IJ SOLN
6.0000 [IU] | Freq: Once | INTRAMUSCULAR | Status: AC
  Administered 2022-03-19: 6 [IU] via SUBCUTANEOUS
  Filled 2022-03-19: qty 1

## 2022-03-19 MED ORDER — TRAZODONE HCL 50 MG PO TABS
25.0000 mg | ORAL_TABLET | Freq: Every evening | ORAL | Status: DC | PRN
  Administered 2022-03-20 (×2): 25 mg via ORAL
  Filled 2022-03-19 (×2): qty 1

## 2022-03-19 MED ORDER — SODIUM CHLORIDE 0.9 % IV BOLUS
1000.0000 mL | Freq: Once | INTRAVENOUS | Status: AC
  Administered 2022-03-19: 1000 mL via INTRAVENOUS

## 2022-03-19 MED ORDER — INSULIN ASPART 100 UNIT/ML IJ SOLN
0.0000 [IU] | Freq: Every day | INTRAMUSCULAR | Status: DC
  Administered 2022-03-20: 5 [IU] via SUBCUTANEOUS
  Administered 2022-03-20: 3 [IU] via SUBCUTANEOUS
  Administered 2022-03-21: 4 [IU] via SUBCUTANEOUS
  Administered 2022-03-22: 2 [IU] via SUBCUTANEOUS
  Filled 2022-03-19 (×4): qty 1

## 2022-03-19 MED ORDER — ACETAMINOPHEN 325 MG PO TABS: 650.0000 mg | ORAL_TABLET | Freq: Four times a day (QID) | ORAL | Status: DC | PRN

## 2022-03-19 MED ORDER — SODIUM CHLORIDE 0.9 % IV SOLN: 1.0000 g | INTRAVENOUS | Status: DC

## 2022-03-19 MED ORDER — POTASSIUM CHLORIDE CRYS ER 20 MEQ PO TBCR
40.0000 meq | EXTENDED_RELEASE_TABLET | Freq: Once | ORAL | Status: AC
  Administered 2022-03-19: 40 meq via ORAL
  Filled 2022-03-19: qty 2

## 2022-03-19 MED ORDER — MAGNESIUM HYDROXIDE 400 MG/5ML PO SUSP: 30.0000 mL | Freq: Every day | ORAL | Status: DC | PRN

## 2022-03-19 MED ORDER — ONDANSETRON HCL 4 MG PO TABS: 4.0000 mg | ORAL_TABLET | Freq: Four times a day (QID) | ORAL | Status: DC | PRN

## 2022-03-19 MED ORDER — ONDANSETRON HCL 4 MG/2ML IJ SOLN: 4.0000 mg | Freq: Four times a day (QID) | INTRAMUSCULAR | Status: DC | PRN

## 2022-03-19 MED ORDER — ACETAMINOPHEN 650 MG RE SUPP: 650.0000 mg | Freq: Four times a day (QID) | RECTAL | Status: DC | PRN

## 2022-03-19 MED ORDER — INSULIN ASPART 100 UNIT/ML IJ SOLN
0.0000 [IU] | Freq: Three times a day (TID) | INTRAMUSCULAR | Status: DC
  Administered 2022-03-20: 3 [IU] via SUBCUTANEOUS
  Administered 2022-03-20: 5 [IU] via SUBCUTANEOUS
  Administered 2022-03-20: 2 [IU] via SUBCUTANEOUS
  Administered 2022-03-21: 8 [IU] via SUBCUTANEOUS
  Administered 2022-03-21: 5 [IU] via SUBCUTANEOUS
  Administered 2022-03-21: 11 [IU] via SUBCUTANEOUS
  Administered 2022-03-22: 5 [IU] via SUBCUTANEOUS
  Administered 2022-03-22: 8 [IU] via SUBCUTANEOUS
  Administered 2022-03-23: 11 [IU] via SUBCUTANEOUS
  Filled 2022-03-19 (×8): qty 1

## 2022-03-19 MED ORDER — POTASSIUM CHLORIDE 20 MEQ PO PACK
40.0000 meq | PACK | Freq: Once | ORAL | Status: AC
  Administered 2022-03-19: 40 meq via ORAL
  Filled 2022-03-19: qty 2

## 2022-03-19 NOTE — H&P (Signed)
[facility]   PATIENT NAME: Beth Carroll    MR#:  [id number]  DATE OF BIRTH:  May 09, 1970  DATE OF ADMISSION:  03/19/2022  PRIMARY CARE PHYSICIAN: Baxter Hire, MD   Patient is coming from: Home  REQUESTING/REFERRING PHYSICIAN: Merlyn Lot, MD  CHIEF COMPLAINT:   Chief Complaint  Patient presents with   Abnormal Lab  Weakness and dizziness  HISTORY OF PRESENT ILLNESS:  Beth Carroll is a 52 y.o. female with medical history significant for coronary artery disease, type 2 diabetes mellitus, HFrEF, dyslipidemia and hypertension as well as PAD, who presented to the emergency room with acute onset of generalized weakness and dizziness and was noted to be hypotensive with BP in the 48s .  She denied any nausea or vomiting or diarrhea or abdominal pain.  She admitted to urinary frequency and urgency as well as dysuria.  She has been having diminished urine output but her appetite has been within normal.  She admits to mild cough over the last couple of nights without wheezing or dyspnea.  No fever or chills.  No chest pain or palpitations.  She went to her PCP clinic today when she was noted to be hypotensive.  She denies any paresthesias or focal muscle weakness.  ED Course: When she came to the ER, BP was 54/36 and later 96/61 with hydration with otherwise normal vital signs.  Labs revealed positive UA for UTI and CBC remarkable for anemia better than previous levels and hypokalemia on her CMP with potassium of 2.9 and blood glucose 227 with albumin of 2.7 and total protein of 6.7. EKG as reviewed by me :  EKG showed sinus rhythm with a rate of 77 with Q waves anteroseptally and borderline left axis deviation Imaging: Portable chest x-ray showed no acute cardiopulmonary disease.  The patient was given 1 L bolus of IV normal saline, 6 units of subcutaneous NovoLog, 40 mill [place] p.o. potassium chloride and a gram of IV Rocephin by the EDP.  She will be admitted to a medical  telemetry observation bed for further evaluation and management. PAST MEDICAL HISTORY:   Past Medical History:  Diagnosis Date   Coronary artery disease    Diabetes mellitus without complication ([place])    Diabetes mellitus, type 2 (HCC)    HFrEF (heart failure with reduced ejection fraction) ([place])    Hyperlipemia    Hypertension    Ischemic cardiomyopathy    Orthostatic hypotension    PAD (peripheral artery disease) ([place])    Tobacco use     PAST SURGICAL HISTORY:   Past Surgical History:  Procedure Laterality Date   CORONARY STENT INTERVENTION N/A 12/15/2020   Procedure: CORONARY STENT INTERVENTION;  Surgeon: Wellington Hampshire, MD;  Location: [facility];  Service: Cardiovascular;  Laterality: N/A;   LEFT HEART CATH AND CORONARY ANGIOGRAPHY N/A 12/15/2020   Procedure: LEFT HEART CATH AND CORONARY ANGIOGRAPHY;  Surgeon: Wellington Hampshire, MD;  Location: [facility];  Service: Cardiovascular;  Laterality: N/A;   LOWER EXTREMITY ANGIOGRAPHY Left 12/10/2020   Procedure: Lower Extremity Angiography;  Surgeon: Algernon Huxley, MD;  Location: [facility];  Service: Cardiovascular;  Laterality: Left;   LOWER EXTREMITY ANGIOGRAPHY Right 12/25/2020   Procedure: LOWER EXTREMITY ANGIOGRAPHY;  Surgeon: Algernon Huxley, MD;  Location: [facility];  Service: Cardiovascular;  Laterality: Right;   LOWER EXTREMITY ANGIOGRAPHY Right 08/19/2021   Procedure: Lower Extremity Angiography;  Surgeon: Algernon Huxley,  MD;  Location: [facility];  Service: Cardiovascular;  Laterality: Right;   LOWER EXTREMITY ANGIOGRAPHY Right 08/19/2021   Procedure: Lower Extremity Angiography;  Surgeon: Algernon Huxley, MD;  Location: [facility];  Service: Cardiovascular;  Laterality: Right;   ORIF FEMUR FRACTURE Right 12/14/2021   Procedure: OPEN REDUCTION INTERNAL FIXATION RIGHT DISTAL FEMUR;  Surgeon: Shona Needles, MD;  Location: [facility];  Service: Orthopedics;  Laterality: Right;     SOCIAL HISTORY:   Social History   Tobacco Use   Smoking status: Former    Types: Cigarettes    Quit date: 12/09/2020    Years since quitting: 1.2   Smokeless tobacco: Never  Substance Use Topics   Alcohol use: Never    FAMILY HISTORY:   Family History  Problem Relation Age of Onset   Hypertension Father     DRUG ALLERGIES:  No Known Allergies  REVIEW OF SYSTEMS:   ROS As per history of present illness. All pertinent systems were reviewed above. Constitutional, HEENT, cardiovascular, respiratory, GI, GU, musculoskeletal, neuro, psychiatric, endocrine, integumentary and hematologic systems were reviewed and are otherwise negative/unremarkable except for positive findings mentioned above in the HPI.   MEDICATIONS AT HOME:   Prior to Admission medications   Medication Sig Start Date End Date Taking? Authorizing Provider  acetaminophen (TYLENOL) 650 MG CR tablet Take 650 mg by mouth every 8 (eight) hours as needed for pain.   Yes [provider]  albuterol (VENTOLIN HFA) 108 (90 Base) MCG/ACT inhaler Inhale 2 puffs into the lungs every 6 (six) hours as needed for wheezing or shortness of breath.   Yes [provider]  aspirin EC 81 MG tablet Take 81 mg by mouth daily. Swallow whole.   Yes [provider]  atorvastatin (LIPITOR) 20 MG tablet Take 1 tablet (20 mg total) by mouth daily. 12/17/20  Yes Elgergawy, Silver Huguenin, MD  ELIQUIS 5 MG TABS tablet TAKE 1 TABLET BY MOUTH TWICE A DAY 12/09/21  Yes Kris Hartmann, NP  FARXIGA 10 MG TABS tablet TAKE 1 TABLET BY MOUTH EVERY DAY BEFORE BREAKFAST Patient taking differently: Take 10 mg by mouth daily. 11/10/21  Yes Furth, Cadence H, PA-C  insulin glargine (LANTUS SOLOSTAR) 100 UNIT/ML Solostar Pen Inject 25 Units into the skin daily.   Yes [provider]  Multiple Vitamins-Minerals (CENTRUM SILVER 50+WOMEN PO) Take 1 tablet by mouth daily.   Yes [provider]  pantoprazole  (PROTONIX) 40 MG tablet Take 1 tablet (40 mg total) by mouth daily. 12/17/20  Yes Elgergawy, Silver Huguenin, MD  Semaglutide,0.25 or 0.'5MG'$ /DOS, 2 MG/3ML SOPN Inject 0.5 mg into the skin 3 (three) times daily. Before each meal 10/27/21  Yes [provider]  tiotropium (SPIRIVA) 18 MCG inhalation capsule Place 1 capsule into inhaler and inhale daily. 09/24/21  Yes [provider]  traMADol (ULTRAM) 50 MG tablet Take 50 mg by mouth every 6 (six) hours as needed for severe pain or moderate pain. 03/19/22  Yes [provider]  Vitamin D, Ergocalciferol, (DRISDOL) 1.25 MG (50000 UNIT) CAPS capsule Take 50,000 Units by mouth once a week. 10/28/20  Yes [provider]  carvedilol (COREG) 3.125 MG tablet Take 1 tablet (3.125 mg total) by mouth 2 (two) times daily with a meal. Patient not taking: Reported on 03/19/2022 12/16/20   Elgergawy, Silver Huguenin, MD  clotrimazole (LOTRIMIN) 1 % cream Apply 1 Application topically 2 (two) times daily. Patient not taking: Reported on 03/19/2022  [provider]  feeding supplement (ENSURE ENLIVE / ENSURE PLUS) LIQD Take 237 mLs by mouth 3 (three) times daily between meals. 12/18/21   Debbe Odea, MD  furosemide (LASIX) 20 MG tablet Take 20 mg by mouth daily. Patient not taking: Reported on 03/19/2022 12/03/20   [provider]  hydrochlorothiazide (HYDRODIURIL) 25 MG tablet Take 25 mg by mouth daily. Patient not taking: Reported on 03/19/2022 10/11/21   [provider]  loperamide (IMODIUM A-D) 2 MG tablet Take 2 mg by mouth 4 (four) times daily as needed for diarrhea or loose stools. Patient not taking: Reported on 03/19/2022    [provider]  losartan (COZAAR) 25 MG tablet TAKE 1/2 TABLET BY MOUTH EVERY DAY Patient not taking: Reported on 03/19/2022 08/31/21   Minna Merritts, MD  methocarbamol (ROBAXIN) 500 MG tablet Take 1 tablet (500 mg total) by mouth every 6 (six) hours as needed for muscle spasms. Patient  not taking: Reported on 03/19/2022 12/18/21   Debbe Odea, MD  Methylcobalamin (B12) 5000 MCG SUBL Place 5,000 mcg under the tongue daily. Patient not taking: Reported on 03/19/2022    [provider]  nutrition supplement, JUVEN, (JUVEN) PACK Take 1 packet by mouth 2 (two) times daily between meals. 12/18/21   Debbe Odea, MD  oxyCODONE-acetaminophen (PERCOCET) 5-325 MG tablet Take 1 tablet by mouth every 4 (four) hours as needed for severe pain. Patient not taking: Reported on 03/19/2022 12/18/21   Corinne Ports, PA-C      VITAL SIGNS:  Blood pressure (!) 175/89, pulse 87, temperature 98.6 F (37 C), temperature source Oral, resp. rate 16, height '5\' 6"'$  (1.676 m), weight 58 kg, last menstrual period 01/26/2015, SpO2 100 %.  PHYSICAL EXAMINATION:  Physical Exam  GENERAL:  52 y.o.-year-old Caucasian female patient lying in the bed with no acute distress.  EYES: Pupils equal, round, reactive to light and accommodation. No scleral icterus. Extraocular muscles intact.  HEENT: Head atraumatic, normocephalic. Oropharynx with dry mucous membrane and tongue and nasopharynx clear.  NECK:  Supple, no jugular venous distention. No thyroid enlargement, no tenderness.  LUNGS: Normal breath sounds bilaterally, no wheezing, rales,rhonchi or crepitation. No use of accessory muscles of respiration.  CARDIOVASCULAR: Regular rate and rhythm, S1, S2 normal. No murmurs, rubs, or gallops.  ABDOMEN: Soft, nondistended, nontender. Bowel sounds present. No organomegaly or mass.  EXTREMITIES: No pedal edema, cyanosis, or clubbing.  NEUROLOGIC: Cranial nerves II through XII are intact. Muscle strength 5/5 in all extremities. Sensation intact. Gait not checked.  PSYCHIATRIC: The patient is alert and oriented x 3.  Normal affect and good eye contact. SKIN: No obvious rash, lesion, or ulcer.   LABORATORY PANEL:   CBC Recent Labs  Lab 03/19/22 1644  WBC 7.2  HGB 10.3*  HCT 33.1*  PLT 395    ------------------------------------------------------------------------------------------------------------------  Chemistries  Recent Labs  Lab 03/19/22 1644  NA 137  K 2.9*  CL 98  CO2 25  GLUCOSE 227*  BUN 11  CREATININE 0.62  CALCIUM 8.8*  AST 17  ALT 9  ALKPHOS 96  BILITOT 0.8   ------------------------------------------------------------------------------------------------------------------  Cardiac Enzymes No results for input(s): "TROPONINI" in the last 168 hours. ------------------------------------------------------------------------------------------------------------------  RADIOLOGY:  DG Chest Port 1 View  Result Date: 03/19/2022 CLINICAL DATA:  Sepsis. EXAM: PORTABLE CHEST 1 VIEW COMPARISON:  12/10/2021 FINDINGS: The heart size and mediastinal contours are within normal limits. Aortic atherosclerotic calcification incidentally noted. Both lungs are clear. The visualized skeletal structures are unremarkable. IMPRESSION: No  active disease. Electronically Signed   By: Marlaine Hind M.D.   On: 03/19/2022 17:46      IMPRESSION AND PLAN:  Assessment and Plan: * Hypotension - This is likely secondary to volume depletion and dehydration. - The patient will be placed in a medical observation telemetry bed. - We will continue hydration with IV normal saline with added potassium chloride. - We will follow orthostatics as there are dizziness and lightheadedness could be related to orthostatic hypotension..  Acute lower UTI (urinary tract infection) - We will continue IV Rocephin and follow urine culture.  Hypokalemia - Potassium will be replaced and magnesium level will be checked.  Dyslipidemia - We will continue statin therapy.  GERD without esophagitis - We will continue PPI therapy.  CAD S/P percutaneous coronary angioplasty - We will continue aspirin as well as beta-blocker therapy and statin therapy.  Essential hypertension We will hold off  antihypertensives given her hypotension.  Type 2 diabetes mellitus without complications ([place]) - The patient will be placed on supplement coverage with NovoLog. - We will continue her basal coverage.   DVT prophylaxis: Lovenox.  Advanced Care Planning:  Code Status: full code.  Family Communication:  The plan of care was discussed in details with the patient (and family). I answered all questions. The patient agreed to proceed with the above mentioned plan. Further management will depend upon hospital course. Disposition Plan: Back to previous home environment Consults called: none.  All the records are reviewed and case discussed with ED provider.  Status is: Observation   I certify that at the time of admission, it is my clinical judgment that the patient will require  hospital care extending less than 2 midnights.                            Dispo: The patient is from: Home              Anticipated d/c is to: Home              Patient currently is not medically stable to d/c.              Difficult to place patient: No  Christel Mormon M.D on 03/19/2022 at 11:35 PM  [facility]   From 7 PM-7 AM, contact night-coverage [email or web address]  CC: Primary care physician; Baxter Hire, MD

## 2022-03-19 NOTE — Assessment & Plan Note (Signed)
-   We will continue PPI therapy. 

## 2022-03-19 NOTE — ED Provider Notes (Signed)
[facility] Provider Note    Event Date/Time   First MD Initiated Contact with Patient 03/19/22 1650     (approximate)   History   Abnormal Lab   HPI  Jesha Blossom is a 52 y.o. female with a complex recent past medical history presents to the ER for evaluation of weakness and lightheadedness.  Was in PCP clinic today found to be hypotensive.  Has been on diuresis as well as antihypertensive medications was told to stop these.  She is been feeling weak and lightheaded for few days now.  Denies any pain.  Was found to be significantly hypotensive with EMS.  No fevers or chills.  Does have some dysuria.     Physical Exam   Triage Vital Signs: ED Triage Vitals  Enc Vitals Group     BP 03/19/22 1639 (!) 54/36     Pulse Rate 03/19/22 1639 80     Resp 03/19/22 1639 18     Temp 03/19/22 1639 98.3 F (36.8 C)     Temp Source 03/19/22 1639 Oral     SpO2 03/19/22 1639 96 %     Weight 03/19/22 1636 116 lb 10 oz (52.9 kg)     Height 03/19/22 1636 '5\' 6"'$  (1.676 m)     Head Circumference --      Peak Flow --      Pain Score 03/19/22 1635 0     Pain Loc --      Pain Edu? --      Excl. in Cylinder? --     Most recent vital signs: Vitals:   03/19/22 2130 03/19/22 2145  BP: (!) 145/68   Pulse: 85 83  Resp: (!) 24 20  Temp:    SpO2: 97% 98%     Constitutional: Alert  Eyes: Conjunctivae are normal.  Head: Atraumatic. Nose: No congestion/rhinnorhea. Mouth/Throat: Mucous membranes are moist.   Neck: Painless ROM.  Cardiovascular:   Good peripheral circulation. Respiratory: Normal respiratory effort.  No retractions.  Gastrointestinal: Soft and nontender.  Musculoskeletal: Chronic bilateral lower extremity edema. Neurologic:  MAE spontaneously. No gross focal neurologic deficits are appreciated.  Skin:  Skin is warm, dry and intact. No rash noted. Psychiatric: Mood and affect are normal. Speech and behavior are normal.    ED Results / Procedures /  Treatments   Labs (all labs ordered are listed, but only abnormal results are displayed) Labs Reviewed  COMPREHENSIVE METABOLIC PANEL - Abnormal; Notable for the following components:      Result Value   Potassium 2.9 (*)    Glucose, Bld 227 (*)    Calcium 8.8 (*)    Albumin 2.7 (*)    All other components within normal limits  CBC WITH DIFFERENTIAL/PLATELET - Abnormal; Notable for the following components:   Hemoglobin 10.3 (*)    HCT 33.1 (*)    All other components within normal limits  URINALYSIS, COMPLETE (UACMP) WITH MICROSCOPIC - Abnormal; Notable for the following components:   Color, Urine YELLOW (*)    APPearance CLOUDY (*)    Glucose, UA >=500 (*)    Hgb urine dipstick LARGE (*)    Ketones, ur 20 (*)    Leukocytes,Ua LARGE (*)    Bacteria, UA RARE (*)    All other components within normal limits  LACTIC ACID, PLASMA  APTT  HIV ANTIBODY (ROUTINE TESTING W REFLEX)  BASIC METABOLIC PANEL  CBC  TROPONIN I (HIGH SENSITIVITY)  TROPONIN I (HIGH SENSITIVITY)  EKG  ED ECG REPORT I, Merlyn Lot, the attending physician, personally viewed and interpreted this ECG.   Date: 03/19/2022  EKG Time: 17:04  Rate: 75  Rhythm: SINUS  Axis: normal  Intervals: prolonged qt  ST&T Change: no stemi, no depressions    RADIOLOGY Please see ED Course for my review and interpretation.  I personally reviewed all radiographic images ordered to evaluate for the above acute complaints and reviewed radiology reports and findings.  These findings were personally discussed with the patient.  Please see medical record for radiology report.    PROCEDURES:  Critical Care performed: No  Procedures   MEDICATIONS ORDERED IN ED: Medications  acetaminophen (TYLENOL) tablet 650 mg (has no administration in time range)    Or  acetaminophen (TYLENOL) suppository 650 mg (has no administration in time range)  traZODone (DESYREL) tablet 25 mg (has no administration in time  range)  magnesium hydroxide (MILK OF MAGNESIA) suspension 30 mL (has no administration in time range)  ondansetron (ZOFRAN) tablet 4 mg (has no administration in time range)    Or  ondansetron (ZOFRAN) injection 4 mg (has no administration in time range)  0.9 % NaCl with KCl 20 mEq/ L  infusion ( Intravenous New Bag/Given 03/19/22 2210)  sodium chloride 0.9 % bolus 1,000 mL (0 mLs Intravenous Stopped 03/19/22 1730)  insulin aspart (novoLOG) injection 6 Units (6 Units Subcutaneous Given 03/19/22 1824)  potassium chloride SA (KLOR-CON M) CR tablet 40 mEq (40 mEq Oral Given 03/19/22 1824)  cefTRIAXone (ROCEPHIN) 1 g in sodium chloride 0.9 % 100 mL IVPB (0 g Intravenous Stopped 03/19/22 2127)  potassium chloride (KLOR-CON) packet 40 mEq (40 mEq Oral Given 03/19/22 2212)     IMPRESSION / MDM / ASSESSMENT AND PLAN / ED COURSE  I reviewed the triage vital signs and the nursing notes.                              Differential diagnosis includes, but is not limited to, patient, sepsis, DKA, HHS, electrolyte abnormality, dysrhythmia, UTI  Patient presenting to the ER for evaluation of symptoms as described above.  Based on symptoms, risk factors and considered above differential, this presenting complaint could reflect a potentially life-threatening illness therefore the patient will be placed on continuous pulse oximetry and telemetry for monitoring.  Laboratory evaluation will be sent to evaluate for the above complaints.      Clinical Course as of 03/19/22 2246  Fri Mar 19, 2022  1714 Blood pressure significantly improved with IV fluid. [PR]  [id number] No significant anemia, no leukocytosis [PR]  1741 Chest x-ray my review and interpretation without pneumothorax or consolidation no cardiomegaly no edema.  Blood pressure significant improved with IV fluid.  I suspect a large component of this is likely her hypertension medication is well as probable dehydration. [PR]  [id number] Importantly she does not have  any signs of DKA [PR]  1742 Normal bicarb and normal gap..  Mildly hyperglycemic lactate is normal. [PR]  1918 Pressures have remained stable.  Troponin negative.  She is denying any chest pain or pressure.  States that she has felt weak and dizzy for the past few days I suspect that she is likely overdiuresed or of medicated with her antihypertensive medication as there is no real other explanation and she has significantly improved after IV hydration.  Still awaiting urinalysis. [PR]  2018 Patient's urine does have some rare bacteria many whites.  On further questioning she does endorse some urinary discomfort and frequency.  Review of previous record shows that she did grow out pansensitive E. coli prior to recent admission.  Discussed options with her she is not meeting sepsis criteria right now but her significant hypotension is concerning.  She does not feel comfortable being discharged home at this time. [PR]    Clinical Course User Index [PR] Merlyn Lot, MD      FINAL CLINICAL IMPRESSION(S) / ED DIAGNOSES   Final diagnoses:  Dehydration  Weakness     Rx / DC Orders   ED Discharge Orders     None        Note:  This document was prepared using Dragon voice recognition software and may include unintentional dictation errors.    Merlyn Lot, MD 03/19/22 [phone]

## 2022-03-19 NOTE — Assessment & Plan Note (Signed)
-   The patient will be placed on supplement coverage with NovoLog. - We will continue her basal coverage.

## 2022-03-19 NOTE — Assessment & Plan Note (Signed)
-   We will continue statin therapy. 

## 2022-03-19 NOTE — ED Notes (Signed)
Family reports pt cannot receive morphine or fentanyl but unsure if it is due to an allergy.

## 2022-03-19 NOTE — Assessment & Plan Note (Signed)
-   This is likely secondary to volume depletion and dehydration. - The patient will be placed in a medical observation telemetry bed. - We will continue hydration with IV normal saline with added potassium chloride. - We will follow orthostatics as there are dizziness and lightheadedness could be related to orthostatic hypotension.Marland Kitchen

## 2022-03-19 NOTE — Assessment & Plan Note (Signed)
-   We will place on supplemental coverage with NovoLog.

## 2022-03-19 NOTE — Assessment & Plan Note (Signed)
-   We will continue IV Rocephin and follow urine culture.

## 2022-03-19 NOTE — Assessment & Plan Note (Signed)
We will hold off antihypertensives given her hypotension.

## 2022-03-19 NOTE — Assessment & Plan Note (Addendum)
-   We will continue aspirin as well as beta-blocker therapy and statin therapy.

## 2022-03-19 NOTE — Assessment & Plan Note (Signed)
-   Potassium will be replaced and magnesium level will be checked. ?

## 2022-03-20 DIAGNOSIS — R059 Cough, unspecified: Secondary | ICD-10-CM | POA: Diagnosis present

## 2022-03-20 DIAGNOSIS — D649 Anemia, unspecified: Secondary | ICD-10-CM | POA: Diagnosis present

## 2022-03-20 DIAGNOSIS — Z8249 Family history of ischemic heart disease and other diseases of the circulatory system: Secondary | ICD-10-CM | POA: Diagnosis not present

## 2022-03-20 DIAGNOSIS — I5022 Chronic systolic (congestive) heart failure: Secondary | ICD-10-CM | POA: Diagnosis present

## 2022-03-20 DIAGNOSIS — Z955 Presence of coronary angioplasty implant and graft: Secondary | ICD-10-CM | POA: Diagnosis not present

## 2022-03-20 DIAGNOSIS — I9589 Other hypotension: Secondary | ICD-10-CM | POA: Diagnosis not present

## 2022-03-20 DIAGNOSIS — K219 Gastro-esophageal reflux disease without esophagitis: Secondary | ICD-10-CM | POA: Diagnosis present

## 2022-03-20 DIAGNOSIS — M79604 Pain in right leg: Secondary | ICD-10-CM | POA: Diagnosis present

## 2022-03-20 DIAGNOSIS — I110 Hypertensive heart disease with heart failure: Secondary | ICD-10-CM | POA: Diagnosis present

## 2022-03-20 DIAGNOSIS — E785 Hyperlipidemia, unspecified: Secondary | ICD-10-CM | POA: Diagnosis present

## 2022-03-20 DIAGNOSIS — N390 Urinary tract infection, site not specified: Secondary | ICD-10-CM | POA: Diagnosis present

## 2022-03-20 DIAGNOSIS — M79605 Pain in left leg: Secondary | ICD-10-CM | POA: Diagnosis present

## 2022-03-20 DIAGNOSIS — E876 Hypokalemia: Secondary | ICD-10-CM | POA: Diagnosis present

## 2022-03-20 DIAGNOSIS — I5021 Acute systolic (congestive) heart failure: Secondary | ICD-10-CM | POA: Diagnosis not present

## 2022-03-20 DIAGNOSIS — I255 Ischemic cardiomyopathy: Secondary | ICD-10-CM | POA: Diagnosis present

## 2022-03-20 DIAGNOSIS — E861 Hypovolemia: Secondary | ICD-10-CM | POA: Diagnosis not present

## 2022-03-20 DIAGNOSIS — E1165 Type 2 diabetes mellitus with hyperglycemia: Secondary | ICD-10-CM | POA: Diagnosis present

## 2022-03-20 DIAGNOSIS — E860 Dehydration: Secondary | ICD-10-CM | POA: Diagnosis present

## 2022-03-20 DIAGNOSIS — I2510 Atherosclerotic heart disease of native coronary artery without angina pectoris: Secondary | ICD-10-CM | POA: Diagnosis present

## 2022-03-20 DIAGNOSIS — L89152 Pressure ulcer of sacral region, stage 2: Secondary | ICD-10-CM | POA: Diagnosis present

## 2022-03-20 DIAGNOSIS — Z87891 Personal history of nicotine dependence: Secondary | ICD-10-CM | POA: Diagnosis not present

## 2022-03-20 DIAGNOSIS — Z794 Long term (current) use of insulin: Secondary | ICD-10-CM | POA: Diagnosis not present

## 2022-03-20 DIAGNOSIS — E1151 Type 2 diabetes mellitus with diabetic peripheral angiopathy without gangrene: Secondary | ICD-10-CM | POA: Diagnosis present

## 2022-03-20 DIAGNOSIS — M6281 Muscle weakness (generalized): Secondary | ICD-10-CM | POA: Diagnosis present

## 2022-03-20 DIAGNOSIS — I959 Hypotension, unspecified: Secondary | ICD-10-CM | POA: Diagnosis present

## 2022-03-20 DIAGNOSIS — Z993 Dependence on wheelchair: Secondary | ICD-10-CM | POA: Diagnosis not present

## 2022-03-20 DIAGNOSIS — Z9861 Coronary angioplasty status: Secondary | ICD-10-CM | POA: Diagnosis not present

## 2022-03-20 LAB — BASIC METABOLIC PANEL
Anion gap: 9 (ref 5–15)
BUN: 9 mg/dL (ref 6–20)
CO2: 26 mmol/L (ref 22–32)
Calcium: 8.4 mg/dL — ABNORMAL LOW (ref 8.9–10.3)
Chloride: 106 mmol/L (ref 98–111)
Creatinine, Ser: 0.41 mg/dL — ABNORMAL LOW (ref 0.44–1.00)
GFR, Estimated: >60 mL/min (ref >60)
Glucose, Bld: 197 mg/dL — ABNORMAL HIGH (ref 70–99)
Potassium: 3.7 mmol/L (ref 3.5–5.1)
Sodium: 141 mmol/L (ref 135–145)

## 2022-03-20 LAB — CBC
HCT: 29.5 % — ABNORMAL LOW (ref 36.0–46.0)
Hemoglobin: 9.1 g/dL — ABNORMAL LOW (ref 12.0–15.0)
MCH: 26.1 pg (ref 26.0–34.0)
MCHC: 30.8 g/dL (ref 30.0–36.0)
MCV: 84.5 fL (ref 80.0–100.0)
Platelets: 356 10*3/uL (ref 150–400)
RBC: 3.49 MIL/uL — ABNORMAL LOW (ref 3.87–5.11)
RDW: 15.3 % (ref 11.5–15.5)
WBC: 6.3 10*3/uL (ref 4.0–10.5)
nRBC: 0 % (ref 0.0–0.2)

## 2022-03-20 LAB — MAGNESIUM: Magnesium: 1.5 mg/dL — ABNORMAL LOW (ref 1.7–2.4)

## 2022-03-20 LAB — GLUCOSE, CAPILLARY
Glucose-Capillary: 132 mg/dL — ABNORMAL HIGH (ref 70–99)
Glucose-Capillary: 163 mg/dL — ABNORMAL HIGH (ref 70–99)
Glucose-Capillary: 233 mg/dL — ABNORMAL HIGH (ref 70–99)
Glucose-Capillary: 293 mg/dL — ABNORMAL HIGH (ref 70–99)
Glucose-Capillary: 364 mg/dL — ABNORMAL HIGH (ref 70–99)

## 2022-03-20 LAB — HIV ANTIBODY (ROUTINE TESTING W REFLEX): HIV Screen 4th Generation wRfx: NONREACTIVE

## 2022-03-20 MED ORDER — SODIUM CHLORIDE 0.9 % IV SOLN
1.0000 g | INTRAVENOUS | Status: AC
  Administered 2022-03-20 – 2022-03-22 (×3): 1 g via INTRAVENOUS
  Filled 2022-03-20 (×3): qty 1

## 2022-03-20 NOTE — Progress Notes (Addendum)
Patient assessed to see how she would feel when standing/ambulating Her BP did drop some but it started high. She started saying she was dizzy and lightheaded before I stood her up. She said it is not as bad as yesterday but still there. I was only able to stand her at the side of the bed. she had to sit down in between standing BP's readings due to weakness. Dr Priscella Mann notified and will be keeping the patient  over night and re-evaluate her tomorrow for discharge

## 2022-03-20 NOTE — Progress Notes (Signed)
PROGRESS NOTE    Beth Carroll  [id number] DOB: 1971/01/18 DOA: 03/19/2022 PCP: Baxter Hire, MD    Brief Narrative:  52 y.o. female with medical history significant for coronary artery disease, type 2 diabetes mellitus, HFrEF, dyslipidemia and hypertension as well as PAD, who presented to the emergency room with acute onset of generalized weakness and dizziness and was noted to be hypotensive with BP in the 16s .  She denied any nausea or vomiting or diarrhea or abdominal pain.  She admitted to urinary frequency and urgency as well as dysuria.  She has been having diminished urine output but her appetite has been within normal.  She admits to mild cough over the last couple of nights without wheezing or dyspnea.  No fever or chills.  No chest pain or palpitations.  She went to her PCP clinic today when she was noted to be hypotensive.  She denies any paresthesias or focal muscle weakness.   Assessment & Plan:   Principal Problem:   Hypotension Active Problems:   Acute lower UTI (urinary tract infection)   Hypokalemia   Type 2 diabetes mellitus without complications (HCC)   Essential hypertension   GERD without esophagitis   Dyslipidemia   Dehydration  * Hypotension Suspect secondary to intravascular volume depletion in the setting of suspected UTI.  Patient improved however still very symptomatic upon rising and ambulation. Plan: Treat UTI as below Supplemental IV fluids PT and OT evaluations Orthostatic vitals  Acute lower UTI (urinary tract infection) Continue IV Rocephin x 3 days, today dose 2 IV fluids Monitor cultures   Hypokalemia Potassium added to maintenance fluids   Dyslipidemia PTA statin   GERD without esophagitis PTA PPI   CAD S/P percutaneous coronary angioplasty Aspirin, beta-blocker, statin   Essential hypertension Blood pressure medications on hold   Type 2 diabetes mellitus without complications (HCC) Basal bolus regimen   DVT  prophylaxis: SQ Lovenox Code Status: Full Family Communication: None today Disposition Plan: Status is: Observation The patient will require care spanning > 2 midnights and should be moved to inpatient because: UTI and associated symptomatic hypotension.  Needs IV fluids and IV antibiotics   Level of care: Telemetry Medical  Consultants:  None  Procedures:  None  Antimicrobials: Rocephin   Subjective: Seen and examined.  Resting comfortably in bed.  No visible distress  Objective: Vitals:   03/20/22 1218 03/20/22 1222 03/20/22 1225 03/20/22 1232  BP: (!) 176/79 (!) 145/84 134/70 128/76  Pulse:  79  82  Resp:      Temp:      TempSrc:      SpO2:      Weight:      Height:        Intake/Output Summary (Last 24 hours) at 03/20/2022 1325 Last data filed at 03/20/2022 1236 Gross per 24 hour  Intake 2592.28 ml  Output 800 ml  Net 1792.28 ml   Filed Weights   03/19/22 1636 03/19/22 2257  Weight: 52.9 kg 58 kg    Examination:  General exam: Appears calm and comfortable  Respiratory system: Clear to auscultation. Respiratory effort normal. Cardiovascular system: S1-2, RRR, no murmurs, no pedal edema Gastrointestinal system: Thin, soft, NT/ND, normal bowel sounds Central nervous system: Alert and oriented. No focal neurological deficits. Extremities: Symmetric 5 x 5 power. Skin: No rashes, lesions or ulcers Psychiatry: Judgement and insight appear normal. Mood & affect appropriate.     Data Reviewed: I have personally reviewed following labs and imaging studies  CBC: Recent Labs  Lab 03/19/22 1644 03/20/22 0513  WBC 7.2 6.3  NEUTROABS 5.4  --   HGB 10.3* 9.1*  HCT 33.1* 29.5*  MCV 84.2 84.5  PLT 395 [id number]   Basic Metabolic Panel: Recent Labs  Lab 03/19/22 1644 03/19/22 2240 03/20/22 0513  NA 137  --  141  K 2.9*  --  3.7  CL 98  --  106  CO2 25  --  26  GLUCOSE 227*  --  197*  BUN 11  --  9  CREATININE 0.62  --  0.41*  CALCIUM 8.8*  --  8.4*  MG   --  1.5*  --    GFR: Estimated Creatinine Clearance: 75.3 mL/min (A) (by C-G formula based on SCr of 0.41 mg/dL (L)). Liver Function Tests: Recent Labs  Lab 03/19/22 1644  AST 17  ALT 9  ALKPHOS 96  BILITOT 0.8  PROT 6.7  ALBUMIN 2.7*   No results for input(s): "LIPASE", "AMYLASE" in the last 168 hours. No results for input(s): "AMMONIA" in the last 168 hours. Coagulation Profile: No results for input(s): "INR", "PROTIME" in the last 168 hours. Cardiac Enzymes: No results for input(s): "CKTOTAL", "CKMB", "CKMBINDEX", "TROPONINI" in the last 168 hours. BNP (last 3 results) No results for input(s): "PROBNP" in the last 8760 hours. HbA1C: No results for input(s): "HGBA1C" in the last 72 hours. CBG: Recent Labs  Lab 03/20/22 0013 03/20/22 0832 03/20/22 1151  GLUCAP 293* 163* 132*   Lipid Profile: No results for input(s): "CHOL", "HDL", "LDLCALC", "TRIG", "CHOLHDL", "LDLDIRECT" in the last 72 hours. Thyroid Function Tests: No results for input(s): "TSH", "T4TOTAL", "FREET4", "T3FREE", "THYROIDAB" in the last 72 hours. Anemia Panel: No results for input(s): "VITAMINB12", "FOLATE", "FERRITIN", "TIBC", "IRON", "RETICCTPCT" in the last 72 hours. Sepsis Labs: Recent Labs  Lab 03/19/22 1703  LATICACIDVEN 1.5    No results found for this or any previous visit (from the past 240 hour(s)).       Radiology Studies: DG Chest Port 1 View  Result Date: 03/19/2022 CLINICAL DATA:  Sepsis. EXAM: PORTABLE CHEST 1 VIEW COMPARISON:  12/10/2021 FINDINGS: The heart size and mediastinal contours are within normal limits. Aortic atherosclerotic calcification incidentally noted. Both lungs are clear. The visualized skeletal structures are unremarkable. IMPRESSION: No active disease. Electronically Signed   By: Marlaine Hind M.D.   On: 03/19/2022 17:46        Scheduled Meds:  insulin aspart  0-15 Units Subcutaneous TID WC   insulin aspart  0-5 Units Subcutaneous QHS   Continuous  Infusions:  0.9 % NaCl with KCl 20 mEq / L 100 mL/hr at 03/20/22 1214   cefTRIAXone (ROCEPHIN)  IV Stopped (03/20/22 1132)     LOS: 0 days      Sidney Ace, MD [facility]   If 7PM-7AM, please contact night-coverage  03/20/2022, 1:25 PM

## 2022-03-21 ENCOUNTER — Inpatient Hospital Stay (HOSPITAL_COMMUNITY)
Admit: 2022-03-21 | Discharge: 2022-03-21 | Disposition: A | Discharge status: Home / Self Care | Payer: Medicaid Other | Attending: Internal Medicine | Admitting: Internal Medicine

## 2022-03-21 ENCOUNTER — Inpatient Hospital Stay: Payer: Medicaid Other

## 2022-03-21 DIAGNOSIS — I5021 Acute systolic (congestive) heart failure: Secondary | ICD-10-CM

## 2022-03-21 DIAGNOSIS — I9589 Other hypotension: Secondary | ICD-10-CM | POA: Diagnosis not present

## 2022-03-21 DIAGNOSIS — E861 Hypovolemia: Secondary | ICD-10-CM | POA: Diagnosis not present

## 2022-03-21 LAB — ECHOCARDIOGRAM COMPLETE
AR max vel: 2.84 cm2
AV Peak grad: 8 mmHg
Ao pk vel: 1.41 m/s
Area-P 1/2: 4.39 cm2
Height: 66 in
S' Lateral: 3.1 cm
Single Plane A4C EF: 65.9 %
Weight: 2045.87 oz

## 2022-03-21 LAB — GLUCOSE, CAPILLARY
Glucose-Capillary: 260 mg/dL — ABNORMAL HIGH (ref 70–99)
Glucose-Capillary: 239 mg/dL — ABNORMAL HIGH (ref 70–99)
Glucose-Capillary: 325 mg/dL — ABNORMAL HIGH (ref 70–99)
Glucose-Capillary: 352 mg/dL — ABNORMAL HIGH (ref 70–99)

## 2022-03-21 MED ORDER — ATORVASTATIN CALCIUM 20 MG PO TABS
20.0000 mg | ORAL_TABLET | Freq: Every day | ORAL | Status: DC
  Administered 2022-03-21 – 2022-03-23 (×3): 20 mg via ORAL
  Filled 2022-03-21 (×3): qty 1

## 2022-03-21 MED ORDER — FUROSEMIDE 40 MG PO TABS
40.0000 mg | ORAL_TABLET | Freq: Every day | ORAL | Status: DC
  Administered 2022-03-21 – 2022-03-22 (×2): 40 mg via ORAL
  Filled 2022-03-21 (×2): qty 1

## 2022-03-21 MED ORDER — ASPIRIN 81 MG PO TBEC
81.0000 mg | DELAYED_RELEASE_TABLET | Freq: Every day | ORAL | Status: DC
  Administered 2022-03-21 – 2022-03-23 (×3): 81 mg via ORAL
  Filled 2022-03-21 (×3): qty 1

## 2022-03-21 MED ORDER — APIXABAN 5 MG PO TABS
5.0000 mg | ORAL_TABLET | Freq: Two times a day (BID) | ORAL | Status: DC
  Administered 2022-03-21 – 2022-03-23 (×5): 5 mg via ORAL
  Filled 2022-03-21 (×5): qty 1

## 2022-03-21 MED ORDER — TRAMADOL HCL 50 MG PO TABS
50.0000 mg | ORAL_TABLET | Freq: Four times a day (QID) | ORAL | Status: DC | PRN
  Administered 2022-03-21 – 2022-03-22 (×5): 50 mg via ORAL
  Filled 2022-03-21 (×5): qty 1

## 2022-03-21 MED ORDER — DAPAGLIFLOZIN PROPANEDIOL 10 MG PO TABS: 10.0000 mg | ORAL_TABLET | Freq: Every day | ORAL | Status: DC

## 2022-03-21 MED ORDER — ALBUTEROL SULFATE (2.5 MG/3ML) 0.083% IN NEBU: 3.0000 mL | INHALATION_SOLUTION | Freq: Four times a day (QID) | RESPIRATORY_TRACT | Status: DC | PRN

## 2022-03-21 MED ORDER — BARRIER CREAM NON-SPECIFIED
1.0000 | TOPICAL_CREAM | Freq: Two times a day (BID) | TOPICAL | Status: DC | PRN
  Administered 2022-03-21: 1 via TOPICAL

## 2022-03-21 MED ORDER — ACETAMINOPHEN 325 MG PO TABS: 650.0000 mg | ORAL_TABLET | Freq: Three times a day (TID) | ORAL | Status: DC | PRN

## 2022-03-21 MED ORDER — PANTOPRAZOLE SODIUM 40 MG PO TBEC
40.0000 mg | DELAYED_RELEASE_TABLET | Freq: Every day | ORAL | Status: DC
  Administered 2022-03-21 – 2022-03-23 (×3): 40 mg via ORAL
  Filled 2022-03-21 (×3): qty 1

## 2022-03-21 MED ORDER — FLUCONAZOLE 100 MG PO TABS
100.0000 mg | ORAL_TABLET | Freq: Every day | ORAL | Status: DC
  Administered 2022-03-21 – 2022-03-23 (×3): 100 mg via ORAL
  Filled 2022-03-21 (×3): qty 1

## 2022-03-21 MED ORDER — INSULIN GLARGINE-YFGN 100 UNIT/ML ~~LOC~~ SOLN
15.0000 [IU] | Freq: Every day | SUBCUTANEOUS | Status: DC
  Administered 2022-03-21: 15 [IU] via SUBCUTANEOUS
  Filled 2022-03-21 (×2): qty 0.15

## 2022-03-21 MED ORDER — CARVEDILOL 6.25 MG PO TABS
3.1250 mg | ORAL_TABLET | Freq: Two times a day (BID) | ORAL | Status: DC
  Administered 2022-03-21 – 2022-03-22 (×3): 3.125 mg via ORAL
  Filled 2022-03-21 (×3): qty 1

## 2022-03-21 MED ORDER — CARVEDILOL 6.25 MG PO TABS: 3.1250 mg | ORAL_TABLET | Freq: Two times a day (BID) | ORAL | Status: DC

## 2022-03-21 MED ORDER — ENSURE ENLIVE PO LIQD
237.0000 mL | Freq: Three times a day (TID) | ORAL | Status: DC
  Administered 2022-03-21: 237 mL via ORAL

## 2022-03-21 MED ORDER — GLUCERNA SHAKE PO LIQD: 237.0000 mL | Freq: Three times a day (TID) | ORAL | Status: DC

## 2022-03-21 MED ORDER — TIOTROPIUM BROMIDE MONOHYDRATE 18 MCG IN CAPS
1.0000 | ORAL_CAPSULE | Freq: Every day | RESPIRATORY_TRACT | Status: DC
  Administered 2022-03-21 – 2022-03-23 (×3): 18 ug via RESPIRATORY_TRACT
  Filled 2022-03-21: qty 5

## 2022-03-21 NOTE — Progress Notes (Signed)
PROGRESS NOTE    Beth Carroll  [id number] DOB: [birth year] DOA: 03/19/2022 PCP: Baxter Hire, MD    Brief Narrative:  52 y.o. female with medical history significant for coronary artery disease, type 2 diabetes mellitus, HFrEF, dyslipidemia and hypertension as well as PAD, who presented to the emergency room with acute onset of generalized weakness and dizziness and was noted to be hypotensive with BP in the 54s .  She denied any nausea or vomiting or diarrhea or abdominal pain.  She admitted to urinary frequency and urgency as well as dysuria.  She has been having diminished urine output but her appetite has been within normal.  She admits to mild cough over the last couple of nights without wheezing or dyspnea.  No fever or chills.  No chest pain or palpitations.  She went to her PCP clinic today when she was noted to be hypotensive.  She denies any paresthesias or focal muscle weakness.   Assessment & Plan:   Principal Problem:   Hypotension Active Problems:   Acute lower UTI (urinary tract infection)   Hypokalemia   Type 2 diabetes mellitus without complications (HCC)   Essential hypertension   GERD without esophagitis   Dyslipidemia   Dehydration  * Hypotension Suspect secondary to intravascular volume depletion in the setting of suspected UTI.  Patient improved however still very symptomatic upon rising and ambulation. Hypotension has improved Plan: Treat UTI as below DC IVF PT and OT evaluations Orthostatic vitals  Acute lower UTI (urinary tract infection) Continue IV Rocephin x 3 days, last dose tomorrow IV fluids Monitor cultures, NGTD   Hypokalemia Improved   Dyslipidemia PTA statin   GERD without esophagitis PTA PPI   CAD S/P percutaneous coronary angioplasty Aspirin, beta-blocker, statin   Essential hypertension Blood pressure medications on hold   Type 2 diabetes mellitus without complications (HCC) Basal bolus regimen  Lower extremity  pain/edema Recent orthopedic surgery Decreased level of mobility Seen by therapy.  Patient endorses crushing sensation on both ankles when trying to walk.  Unclear etiology.  Recent ORIF.  At previous hospitalization we attempted to get therapy services however they were declined due to Medicaid status.  Will reengage physical therapy today.  Pursue imaging of bilateral lower extremities.  Get lower extremity ultrasound.  Get 2D echocardiogram.  Further management based on results of the studies.   DVT prophylaxis: SQ Lovenox Code Status: Full Family Communication: None today Disposition Plan: Status is: Inpatient Remains inpatient appropriate because: UTI on IV antibiotics.  Decreased level mobility.  Lower extremity edema.  Workup in progress.     Level of care: Telemetry Medical  Consultants:  None  Procedures:  None  Antimicrobials: Rocephin   Subjective: Seen and examined.  Feels okay this morning  Objective: Vitals:   03/20/22 1550 03/20/22 2039 03/21/22 0436 03/21/22 0744  BP: (!) 149/78 (!) 176/78 (!) 151/73 (!) 166/86  Pulse: 80 81 77 83  Resp: '16 18 18 18  '$ Temp: 98.1 F (36.7 C) 98.3 F (36.8 C) 98.4 F (36.9 C) 98.8 F (37.1 C)  TempSrc:  Oral  Oral  SpO2: 96% 100% 95% 98%  Weight:      Height:        Intake/Output Summary (Last 24 hours) at 03/21/2022 1030 Last data filed at 03/21/2022 0631 Gross per 24 hour  Intake 2616.64 ml  Output 2350 ml  Net 266.64 ml   Filed Weights   03/19/22 1636 03/19/22 2257  Weight: 52.9 kg 58 kg  Examination:  General exam: NAD.  Appears frail Respiratory system: Lungs clear.  No work of breathing.  Room air Cardiovascular system: S1-2, RRR, no murmurs, no pedal edema Gastrointestinal system: Thin, soft, NT/ND, normal bowel sounds Central nervous system: Alert and oriented. No focal neurological deficits. Extremities: Bilateral decreased power of lower extremities Skin: No rashes, lesions or  ulcers Psychiatry: Judgement and insight appear normal. Mood & affect appropriate.     Data Reviewed: I have personally reviewed following labs and imaging studies  CBC: Recent Labs  Lab 03/19/22 1644 03/20/22 0513  WBC 7.2 6.3  NEUTROABS 5.4  --   HGB 10.3* 9.1*  HCT 33.1* 29.5*  MCV 84.2 84.5  PLT 395 [id number]   Basic Metabolic Panel: Recent Labs  Lab 03/19/22 1644 03/19/22 2240 03/20/22 0513  NA 137  --  141  K 2.9*  --  3.7  CL 98  --  106  CO2 25  --  26  GLUCOSE 227*  --  197*  BUN 11  --  9  CREATININE 0.62  --  0.41*  CALCIUM 8.8*  --  8.4*  MG  --  1.5*  --    GFR: Estimated Creatinine Clearance: 75.3 mL/min (A) (by C-G formula based on SCr of 0.41 mg/dL (L)). Liver Function Tests: Recent Labs  Lab 03/19/22 1644  AST 17  ALT 9  ALKPHOS 96  BILITOT 0.8  PROT 6.7  ALBUMIN 2.7*   No results for input(s): "LIPASE", "AMYLASE" in the last 168 hours. No results for input(s): "AMMONIA" in the last 168 hours. Coagulation Profile: No results for input(s): "INR", "PROTIME" in the last 168 hours. Cardiac Enzymes: No results for input(s): "CKTOTAL", "CKMB", "CKMBINDEX", "TROPONINI" in the last 168 hours. BNP (last 3 results) No results for input(s): "PROBNP" in the last 8760 hours. HbA1C: No results for input(s): "HGBA1C" in the last 72 hours. CBG: Recent Labs  Lab 03/20/22 0832 03/20/22 1151 03/20/22 1646 03/20/22 2143 03/21/22 0729  GLUCAP 163* 132* 233* 364* 260*   Lipid Profile: No results for input(s): "CHOL", "HDL", "LDLCALC", "TRIG", "CHOLHDL", "LDLDIRECT" in the last 72 hours. Thyroid Function Tests: No results for input(s): "TSH", "T4TOTAL", "FREET4", "T3FREE", "THYROIDAB" in the last 72 hours. Anemia Panel: No results for input(s): "VITAMINB12", "FOLATE", "FERRITIN", "TIBC", "IRON", "RETICCTPCT" in the last 72 hours. Sepsis Labs: Recent Labs  Lab 03/19/22 1703  LATICACIDVEN 1.5    No results found for this or any previous visit (from  the past 240 hour(s)).       Radiology Studies: DG Chest Port 1 View  Result Date: 03/19/2022 CLINICAL DATA:  Sepsis. EXAM: PORTABLE CHEST 1 VIEW COMPARISON:  12/10/2021 FINDINGS: The heart size and mediastinal contours are within normal limits. Aortic atherosclerotic calcification incidentally noted. Both lungs are clear. The visualized skeletal structures are unremarkable. IMPRESSION: No active disease. Electronically Signed   By: Marlaine Hind M.D.   On: 03/19/2022 17:46        Scheduled Meds:  apixaban  5 mg Oral BID   aspirin EC  81 mg Oral Daily   atorvastatin  20 mg Oral Daily   carvedilol  3.125 mg Oral BID WC   [START ON 03/24/2022] dapagliflozin propanediol  10 mg Oral Daily   feeding supplement  237 mL Oral TID BM   furosemide  40 mg Oral Daily   insulin aspart  0-15 Units Subcutaneous TID WC   insulin aspart  0-5 Units Subcutaneous QHS   insulin glargine-yfgn  15 Units Subcutaneous  Daily   pantoprazole  40 mg Oral Daily   tiotropium  1 capsule Inhalation Daily   Continuous Infusions:  cefTRIAXone (ROCEPHIN)  IV 1 g (03/21/22 0738)     LOS: 1 day      Sidney Ace, MD [facility]   If 7PM-7AM, please contact night-coverage  03/21/2022, 10:30 AM

## 2022-03-21 NOTE — Evaluation (Signed)
Physical Therapy Evaluation Patient Details Name: Beth Carroll MRN: [id number] DOB: December 06, 1970 Today's Date: 03/21/2022  History of Present Illness  Pt is a 52 y/o female admitted secondary to dizziness and weakness. Pt was found to be hypotensive and with an acute UTI. Of note, pt with recent ORIF R LE in November. Further imaging of bilateral LEs pending. PMH including but not limited to coronary artery disease, type 2 diabetes mellitus, HFrEF, dyslipidemia and hypertension as well as PAD.   Clinical Impression  Pt presented supine in bed with HOB elevated, awake and willing to participate in therapy session. Prior to admission, pt reported that she required physical assistance with functional mobility and ADLs. She stated that she currently utilizes a manual w/c as her primary means of mobility and requires assistance from her son to complete transfers. Additionally, she stated that her daughter assists her with wound management of her buttocks wounds. Pt stated that prior to a few weeks ago when she experienced what she describes as a "crushing" sensation in her bilateral heels that she was able to walk using a RW and complete transfers on her own. However, since then she has been unable to walk or weight bear through her heels. Of note, pt with recent R LE ORIF in November, was initially [place] but has since progressed to [facility] per pt reports from her Network engineer. She stated that she is scheduled for a follow-up appointment on 3/5. Pt lives with her boyfriend, daughter and son in a single level home with a couple of steps to enter. She reported that she uses the w/c and physical assistance from family to manage the stairs. At the time of evaluation, pt was able to complete bed mobility with supervision and sit<>stand transfer from EOB x1 with use of RW and mod A. She was unable to tolerate any gait training this date due to bilateral heel pain. Pt would continue to benefit from skilled physical  therapy services at this time while admitted and after d/c to address the below listed limitations in order to improve overall safety and independence with functional mobility.        Recommendations for follow up therapy are one component of a multi-disciplinary discharge planning process, led by the attending physician.  Recommendations may be updated based on patient status, additional functional criteria and insurance authorization.  Follow Up Recommendations Home health PT      Assistance Recommended at Discharge Frequent or constant Supervision/Assistance  Patient can return home with the following  A lot of help with walking and/or transfers;A little help with bathing/dressing/bathroom;Assistance with cooking/housework;Assist for transportation;Help with stairs or ramp for entrance    Equipment Recommendations None recommended by PT (pt has all necessary DME at home)  Recommendations for Other Services       Functional Status Assessment Patient has had a recent decline in their functional status and demonstrates the ability to make significant improvements in function in a reasonable and predictable amount of time.     Precautions / Restrictions Precautions Precautions: Fall Restrictions Weight Bearing Restrictions: Yes RLE Weight Bearing: Weight bearing as tolerated Other Position/Activity Restrictions: per pt report from her ortho surgeon...no MD orders currently in chart      Mobility  Bed Mobility Overal bed mobility: Needs Assistance Bed Mobility: Supine to Sit, Sit to Supine     Supine to sit: Supervision Sit to supine: Supervision   General bed mobility comments: greatly increased time and effort needed but no physical assistance required  Transfers Overall transfer level: Needs assistance Equipment used: Rolling walker (2 wheels) Transfers: Sit to/from Stand Sit to Stand: From elevated surface, Mod assist           General transfer comment:  increased time and effort, bed in an elevated position, good technique with use of RW, mod A to power up into standing, heavy reliance on bilateral UEs on RW once in standing    Ambulation/Gait               General Gait Details: pt uanble to tolerate at this time due to pain in bilateral heels  Stairs            Wheelchair Mobility    Modified Rankin (Stroke Patients Only)       Balance Overall balance assessment: Needs assistance Sitting-balance support: Feet supported Sitting balance-Leahy Scale: Good     Standing balance support: Reliant on assistive device for balance, During functional activity, Bilateral upper extremity supported Standing balance-Leahy Scale: Poor                               Pertinent Vitals/Pain Pain Assessment Pain Assessment: Faces Faces Pain Scale: Hurts even more Pain Location: bilateral heels Pain Descriptors / Indicators: Aching, Burning, Stabbing Pain Intervention(s): Monitored during session, Repositioned    Home Living Family/patient expects to be discharged to:: Private residence Living Arrangements: Spouse/significant other;Children Available Help at Discharge: Family;Available 24 hours/day Type of Home: Mobile home Home Access: Stairs to enter Entrance Stairs-Rails: Can reach both;Left;Right Entrance Stairs-Number of Steps: 3   Home Layout: One level Home Equipment: [place] (2 wheels);BSC/3in1;Shower seat;Wheelchair - manual;Hospital bed      Prior Function Prior Level of Function : Needs assist       Physical Assist : Mobility (physical);ADLs (physical) Mobility (physical): Transfers;Gait;Stairs ADLs (physical): Bathing Mobility Comments: pt reported that she uses a manual w/c as her primary means of mobility, she uses the w/c with assistance from family to "bump up" the steps to get into her home. She also reported that she requires physical assistance from her son to complete  transfers. However, prior to a few weeks ago when there was an incidence of what the pt describes as "crushing" of her heels she was able to walk with a RW and complete transfers independently. Pt reported that she called her Ortho Surgeon's office to report the crushing incident and was told to just continue to attempt further progressing her ability to weight bear through her LEs ADLs Comments: pt reported that her daughter assists her with wound care management of her buttocks wounds     Hand Dominance        Extremity/Trunk Assessment   Upper Extremity Assessment Upper Extremity Assessment: Overall WFL for tasks assessed    Lower Extremity Assessment Lower Extremity Assessment: Generalized weakness;RLE deficits/detail RLE Deficits / Details: pt with very limited AROM of her knee and ankle. She was unable to achieve a neutral ankle position (limitations with DF), lacking ~25 degrees to neutral actively and lacking ~10 degrees to neutral passively. She was also very limited with knee flexion and extension (AROM knee flex = ~50 degrees in sitting, AROM knee ext = lacking ~20 degrees to full knee extension)       Communication   Communication: No difficulties  Cognition Arousal/Alertness: Awake/alert Behavior During Therapy: WFL for tasks assessed/performed Overall Cognitive Status: Within Functional Limits for tasks assessed  General Comments      Exercises Total Joint Exercises Knee Flexion: AROM, Strengthening, Right, 10 reps, Seated General Exercises - Lower Extremity Ankle Circles/Pumps: AROM, Both, 10 reps, Seated Long Arc Quad: AROM, Strengthening, Both, 10 reps, Seated   Assessment/Plan    PT Assessment Patient needs continued PT services  PT Problem List Decreased strength;Decreased range of motion;Decreased activity tolerance;Decreased balance;Decreased mobility;Decreased coordination;Decreased knowledge of  use of DME;Decreased safety awareness;Decreased knowledge of precautions;Pain       PT Treatment Interventions DME instruction;Gait training;Stair training;Functional mobility training;Therapeutic activities;Therapeutic exercise;Balance training;Neuromuscular re-education;Patient/family education    PT Goals (Current goals can be found in the Care Plan section)  Acute Rehab PT Goals Patient Stated Goal: to figure out what is wrong with her feet/heels so she can eventually walk again PT Goal Formulation: With patient Time For Goal Achievement: 04/04/22 Potential to Achieve Goals: Good    Frequency Min 3X/week     Co-evaluation               AM-PAC PT "6 Clicks" Mobility  Outcome Measure Help needed turning from your back to your side while in a flat bed without using bedrails?: None Help needed moving from lying on your back to sitting on the side of a flat bed without using bedrails?: None Help needed moving to and from a bed to a chair (including a wheelchair)?: A Lot Help needed standing up from a chair using your arms (e.g., wheelchair or bedside chair)?: A Lot Help needed to walk in hospital room?: A Lot Help needed climbing 3-5 steps with a railing? : Total 6 Click Score: 15    End of Session Equipment Utilized During Treatment: Gait belt Activity Tolerance: Patient limited by pain Patient left: in bed;with call bell/phone within reach;with bed alarm set Nurse Communication: Mobility status PT Visit Diagnosis: Other abnormalities of gait and mobility (R26.89);Pain Pain - Right/Left:  (bilateral) Pain - part of body: Ankle and joints of foot (heels)    Time: [id number] PT Time Calculation (min) (ACUTE ONLY): 48 min   Charges:   PT Evaluation $PT Eval Moderate Complexity: 1 Mod PT Treatments $Therapeutic Activity: 23-37 mins        Anastasio Champion, DPT  Acute Rehabilitation Services Office [place] 03/21/2022, 11:44 AM

## 2022-03-21 NOTE — Progress Notes (Signed)
  Echocardiogram 2D Echocardiogram has been performed.  Beth Carroll 03/21/2022, 2:24 PM

## 2022-03-21 NOTE — Consult Note (Addendum)
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 While in bed, patient is to minimize time in the supine position. When OOB in chair,. A pressure redistribution chair cushion is provided. This is to be sent home with the patient at time of discharge.  Heels are to be floated while in bed or chair.   [place] nursing team will not follow, but will remain available to this patient, the nursing and medical teams.  Please re-consult if needed.  Thank you for inviting [place] to participate in this patient's Plan of Care.  Maudie Flakes, MSN, RN, CNS, [place], Serita Grammes, [employer], [employer] phone:  [phone]

## 2022-03-22 ENCOUNTER — Inpatient Hospital Stay: Payer: Medicaid Other

## 2022-03-22 LAB — CBC WITH DIFFERENTIAL/PLATELET
Abs Immature Granulocytes: 0.01 10*3/uL (ref 0.00–0.07)
Basophils Absolute: 0 10*3/uL (ref 0.0–0.1)
Basophils Relative: 1 %
Eosinophils Absolute: 0.2 10*3/uL (ref 0.0–0.5)
Eosinophils Relative: 3 %
HCT: 33.7 % — ABNORMAL LOW (ref 36.0–46.0)
Hemoglobin: 10.3 g/dL — ABNORMAL LOW (ref 12.0–15.0)
Immature Granulocytes: 0 %
Lymphocytes Relative: 15 %
Lymphs Abs: 1.1 10*3/uL (ref 0.7–4.0)
MCH: 26.2 pg (ref 26.0–34.0)
MCHC: 30.6 g/dL (ref 30.0–36.0)
MCV: 85.8 fL (ref 80.0–100.0)
Monocytes Absolute: 0.3 10*3/uL (ref 0.1–1.0)
Monocytes Relative: 5 %
Neutro Abs: 5.3 10*3/uL (ref 1.7–7.7)
Neutrophils Relative %: 76 %
Platelets: 399 10*3/uL (ref 150–400)
RBC: 3.93 MIL/uL (ref 3.87–5.11)
RDW: 15.6 % — ABNORMAL HIGH (ref 11.5–15.5)
WBC: 6.9 10*3/uL (ref 4.0–10.5)
nRBC: 0 % (ref 0.0–0.2)

## 2022-03-22 LAB — BASIC METABOLIC PANEL
Anion gap: 7 (ref 5–15)
BUN: 12 mg/dL (ref 6–20)
CO2: 29 mmol/L (ref 22–32)
Calcium: 8.6 mg/dL — ABNORMAL LOW (ref 8.9–10.3)
Chloride: 100 mmol/L (ref 98–111)
Creatinine, Ser: 0.47 mg/dL (ref 0.44–1.00)
GFR, Estimated: >60 mL/min (ref >60)
Glucose, Bld: 154 mg/dL — ABNORMAL HIGH (ref 70–99)
Potassium: 3.2 mmol/L — ABNORMAL LOW (ref 3.5–5.1)
Sodium: 136 mmol/L (ref 135–145)

## 2022-03-22 LAB — MAGNESIUM: Magnesium: 1.6 mg/dL — ABNORMAL LOW (ref 1.7–2.4)

## 2022-03-22 LAB — GLUCOSE, CAPILLARY
Glucose-Capillary: 263 mg/dL — ABNORMAL HIGH (ref 70–99)
Glucose-Capillary: 238 mg/dL — ABNORMAL HIGH (ref 70–99)
Glucose-Capillary: 101 mg/dL — ABNORMAL HIGH (ref 70–99)
Glucose-Capillary: 249 mg/dL — ABNORMAL HIGH (ref 70–99)

## 2022-03-22 MED ORDER — FUROSEMIDE 20 MG PO TABS: 20.0000 mg | ORAL_TABLET | Freq: Every day | ORAL | 0 refills | Status: DC

## 2022-03-22 MED ORDER — INSULIN GLARGINE-YFGN 100 UNIT/ML ~~LOC~~ SOLN
20.0000 [IU] | Freq: Every day | SUBCUTANEOUS | Status: DC
  Administered 2022-03-22 – 2022-03-23 (×2): 20 [IU] via SUBCUTANEOUS
  Filled 2022-03-22 (×2): qty 0.2

## 2022-03-22 MED ORDER — BARRIER CREAM NON-SPECIFIED: 1.0000 | TOPICAL_CREAM | Freq: Two times a day (BID) | TOPICAL | Status: DC | PRN

## 2022-03-22 MED ORDER — GERHARDT'S BUTT CREAM
TOPICAL_CREAM | Freq: Two times a day (BID) | CUTANEOUS | Status: DC
  Filled 2022-03-22: qty 1

## 2022-03-22 MED ORDER — GERHARDT'S BUTT CREAM: 1.0000 | TOPICAL_CREAM | Freq: Two times a day (BID) | CUTANEOUS | 0 refills | Status: DC

## 2022-03-22 MED ORDER — INSULIN ASPART 100 UNIT/ML IJ SOLN
3.0000 [IU] | Freq: Three times a day (TID) | INTRAMUSCULAR | Status: DC
  Administered 2022-03-22 – 2022-03-23 (×3): 3 [IU] via SUBCUTANEOUS
  Filled 2022-03-22 (×3): qty 1

## 2022-03-22 MED ORDER — SODIUM CHLORIDE 0.9 % IV BOLUS: 1000.0000 mL | Freq: Once | INTRAVENOUS | Status: DC

## 2022-03-22 MED ORDER — FLUCONAZOLE 100 MG PO TABS: 100.0000 mg | ORAL_TABLET | Freq: Every day | ORAL | 0 refills | Status: AC

## 2022-03-22 NOTE — Consult Note (Signed)
ORTHOPAEDIC CONSULTATION  REQUESTING PHYSICIAN: Sidney Ace, MD  Chief Complaint: Bilateral lower extremity pain  HPI: Beth Carroll is a 52 y.o. female who complains of bilateral lower extremity pain.  Patient is status post right distal femur ORIF in November with Dr. Doreatha Martin at [facility].  Patient typically ambulates with a walker and is in a wheelchair.  Patient did not do any postoperative physical therapy.  Orthopedics was consulted regarding x-ray showing multiple subacute fractures in bilateral lower extremities.  Past Medical History:  Diagnosis Date   Coronary artery disease    Diabetes mellitus without complication ([place])    Diabetes mellitus, type 2 (HCC)    HFrEF (heart failure with reduced ejection fraction) ([place])    Hyperlipemia    Hypertension    Ischemic cardiomyopathy    Orthostatic hypotension    PAD (peripheral artery disease) ([place])    Tobacco use    Past Surgical History:  Procedure Laterality Date   CORONARY STENT INTERVENTION N/A 12/15/2020   Procedure: CORONARY STENT INTERVENTION;  Surgeon: Wellington Hampshire, MD;  Location: [facility];  Service: Cardiovascular;  Laterality: N/A;   LEFT HEART CATH AND CORONARY ANGIOGRAPHY N/A 12/15/2020   Procedure: LEFT HEART CATH AND CORONARY ANGIOGRAPHY;  Surgeon: Wellington Hampshire, MD;  Location: [place] [facility];  Service: Cardiovascular;  Laterality: N/A;   LOWER EXTREMITY ANGIOGRAPHY Left 12/10/2020   Procedure: Lower Extremity Angiography;  Surgeon: Algernon Huxley, MD;  Location: [place] [facility];  Service: Cardiovascular;  Laterality: Left;   LOWER EXTREMITY ANGIOGRAPHY Right 12/25/2020   Procedure: LOWER EXTREMITY ANGIOGRAPHY;  Surgeon: Algernon Huxley, MD;  Location: [place] [facility];  Service: Cardiovascular;  Laterality: Right;   LOWER EXTREMITY ANGIOGRAPHY Right 08/19/2021   Procedure: Lower Extremity Angiography;  Surgeon: Algernon Huxley, MD;  Location: [facility];  Service:  Cardiovascular;  Laterality: Right;   LOWER EXTREMITY ANGIOGRAPHY Right 08/19/2021   Procedure: Lower Extremity Angiography;  Surgeon: Algernon Huxley, MD;  Location: [place] [facility];  Service: Cardiovascular;  Laterality: Right;   ORIF FEMUR FRACTURE Right 12/14/2021   Procedure: OPEN REDUCTION INTERNAL FIXATION RIGHT DISTAL FEMUR;  Surgeon: Shona Needles, MD;  Location: [place];  Service: Orthopedics;  Laterality: Right;   Social History   Socioeconomic History   Marital status: Single    Spouse name: Not on file   Number of children: Not on file   Years of education: Not on file   Highest education level: Not on file  Occupational History   Not on file  Tobacco Use   Smoking status: Former    Types: Cigarettes    Quit date: 12/09/2020    Years since quitting: 1.2   Smokeless tobacco: Never  Vaping Use   Vaping Use: Never used  Substance and Sexual Activity   Alcohol use: Never   Drug use: Not on file   Sexual activity: Not on file  Other Topics Concern   Not on file  Social History Narrative   Not on file   Social Determinants of Health   Financial Resource Strain: Not on file  Food Insecurity: No Food Insecurity (03/19/2022)   Hunger Vital Sign    Worried About Running Out of Food in the Last Year: Never true    Ran Out of Food in the Last Year: Never true  Transportation Needs: No Transportation Needs (03/19/2022)   PRAPARE - Hydrologist (Medical): No  Lack of Transportation (Non-Medical): No  Physical Activity: Not on file  Stress: Not on file  Social Connections: Not on file   Family History  Problem Relation Age of Onset   Hypertension Father    No Known Allergies Prior to Admission medications   Medication Sig Start Date End Date Taking? Authorizing Provider  acetaminophen (TYLENOL) 650 MG CR tablet Take 650 mg by mouth every 8 (eight) hours as needed for pain.   Yes [provider]  albuterol (VENTOLIN HFA) 108  (90 Base) MCG/ACT inhaler Inhale 2 puffs into the lungs every 6 (six) hours as needed for wheezing or shortness of breath.   Yes [provider]  aspirin EC 81 MG tablet Take 81 mg by mouth daily. Swallow whole.   Yes [provider]  atorvastatin (LIPITOR) 20 MG tablet Take 1 tablet (20 mg total) by mouth daily. 12/17/20  Yes Elgergawy, Silver Huguenin, MD  ELIQUIS 5 MG TABS tablet TAKE 1 TABLET BY MOUTH TWICE A DAY 12/09/21  Yes Kris Hartmann, NP  FARXIGA 10 MG TABS tablet TAKE 1 TABLET BY MOUTH EVERY DAY BEFORE BREAKFAST Patient taking differently: Take 10 mg by mouth daily. 11/10/21  Yes Furth, Cadence H, PA-C  insulin glargine (LANTUS SOLOSTAR) 100 UNIT/ML Solostar Pen Inject 25 Units into the skin daily.   Yes [provider]  Multiple Vitamins-Minerals (CENTRUM SILVER 50+WOMEN PO) Take 1 tablet by mouth daily.   Yes [provider]  pantoprazole (PROTONIX) 40 MG tablet Take 1 tablet (40 mg total) by mouth daily. 12/17/20  Yes Elgergawy, Silver Huguenin, MD  Semaglutide,0.25 or 0.'5MG'$ /DOS, 2 MG/3ML SOPN Inject 0.5 mg into the skin 3 (three) times daily. Before each meal 10/27/21  Yes [provider]  tiotropium (SPIRIVA) 18 MCG inhalation capsule Place 1 capsule into inhaler and inhale daily. 09/24/21  Yes [provider]  traMADol (ULTRAM) 50 MG tablet Take 50 mg by mouth every 6 (six) hours as needed for severe pain or moderate pain. 03/19/22  Yes [provider]  Vitamin D, Ergocalciferol, (DRISDOL) 1.25 MG (50000 UNIT) CAPS capsule Take 50,000 Units by mouth once a week. 10/28/20  Yes [provider]  carvedilol (COREG) 3.125 MG tablet Take 1 tablet (3.125 mg total) by mouth 2 (two) times daily with a meal. Patient not taking: Reported on 03/19/2022 12/16/20   Elgergawy, Silver Huguenin, MD  clotrimazole (LOTRIMIN) 1 % cream Apply 1 Application topically 2 (two) times daily. Patient not taking: Reported on 03/19/2022    [provider]   feeding supplement (ENSURE ENLIVE / ENSURE PLUS) LIQD Take 237 mLs by mouth 3 (three) times daily between meals. 12/18/21   Debbe Odea, MD  furosemide (LASIX) 20 MG tablet Take 20 mg by mouth daily. Patient not taking: Reported on 03/19/2022 12/03/20   [provider]  hydrochlorothiazide (HYDRODIURIL) 25 MG tablet Take 25 mg by mouth daily. Patient not taking: Reported on 03/19/2022 10/11/21   [provider]  loperamide (IMODIUM A-D) 2 MG tablet Take 2 mg by mouth 4 (four) times daily as needed for diarrhea or loose stools. Patient not taking: Reported on 03/19/2022    [provider]  losartan (COZAAR) 25 MG tablet TAKE 1/2 TABLET BY MOUTH EVERY DAY Patient not taking: Reported on 03/19/2022 08/31/21   Minna Merritts, MD  methocarbamol (ROBAXIN) 500 MG tablet Take 1 tablet (500 mg total) by mouth every 6 (six) hours as needed for muscle spasms. Patient not taking: Reported on 03/19/2022  12/18/21   Debbe Odea, MD  Methylcobalamin (B12) 5000 MCG SUBL Place 5,000 mcg under the tongue daily. Patient not taking: Reported on 03/19/2022    [provider]  nutrition supplement, JUVEN, (JUVEN) PACK Take 1 packet by mouth 2 (two) times daily between meals. 12/18/21   Debbe Odea, MD  oxyCODONE-acetaminophen (PERCOCET) 5-325 MG tablet Take 1 tablet by mouth every 4 (four) hours as needed for severe pain. Patient not taking: Reported on 03/19/2022 12/18/21   Corinne Ports, PA-C   ECHOCARDIOGRAM COMPLETE  Result Date: 03/21/2022    ECHOCARDIOGRAM REPORT   Patient Name:   YESENA GREENSTEIN Date of Exam: 03/21/2022 Medical Rec #:  [id number]     Height:       66.0 in Accession #:    [id number]    Weight:       127.9 lb Date of Birth:  Jun 21, 1970     BSA:          1.654 m Patient Age:    66 years      BP:           166/86 mmHg Patient Gender: F             HR:           80 bpm. Exam Location:  [facility] Procedure: 2D Echo Indications:     CHF I50.21  History:         Patient has  prior history of Echocardiogram examinations, most                  recent 06/26/2021.  Sonographer:     Kathlen Brunswick RDCS Referring Phys:  [id number] Sidney Ace Diagnosing Phys: Fransico Him MD IMPRESSIONS  1. Left ventricular ejection fraction, by estimation, is 55 to 60%. The left ventricle has normal function. The left ventricle demonstrates regional wall motion abnormalities (see scoring diagram/findings for description). Left ventricular diastolic parameters were normal. There is akinesis of the left ventricular, apical segment. There is akinesis of the left ventricular, apical septal wall and anterior wall.  2. Right ventricular systolic function is normal. The right ventricular size is normal. There is normal pulmonary artery systolic pressure. The estimated right ventricular systolic pressure is [id number] mmHg.  3. The mitral valve is normal in structure. Trivial mitral valve regurgitation. No evidence of mitral stenosis.  4. The aortic valve is normal in structure. Aortic valve regurgitation is not visualized. Aortic valve sclerosis/calcification is present, without any evidence of aortic stenosis. Aortic valve Vmax measures 1.41 m/s.  5. The inferior vena cava is normal in size with greater than 50% respiratory variability, suggesting right atrial pressure of 3 mmHg. FINDINGS  Left Ventricle: Left ventricular ejection fraction, by estimation, is 55 to 60%. The left ventricle has normal function. The left ventricle demonstrates regional wall motion abnormalities. The left ventricular internal cavity size was normal in size. There is no left ventricular hypertrophy. Left ventricular diastolic parameters were normal. Normal left ventricular filling pressure. Right Ventricle: The right ventricular size is normal. No increase in right ventricular wall thickness. Right ventricular systolic function is normal. There is normal pulmonary artery systolic pressure. The tricuspid regurgitant velocity is 2.63 m/s,  and  with an assumed right atrial pressure of 3 mmHg, the estimated right ventricular systolic pressure is [id number] mmHg. Left Atrium: Left atrial size was normal in size. Right Atrium: Right atrial size was normal in size. Pericardium: There is no evidence of pericardial effusion. Mitral Valve: The mitral valve  is normal in structure. Trivial mitral valve regurgitation. No evidence of mitral valve stenosis. Tricuspid Valve: The tricuspid valve is normal in structure. Tricuspid valve regurgitation is trivial. No evidence of tricuspid stenosis. Aortic Valve: The aortic valve is normal in structure. Aortic valve regurgitation is not visualized. Aortic valve sclerosis/calcification is present, without any evidence of aortic stenosis. Aortic valve peak gradient measures 8.0 mmHg. Pulmonic Valve: The pulmonic valve was normal in structure. Pulmonic valve regurgitation is not visualized. No evidence of pulmonic stenosis. Aorta: The aortic root is normal in size and structure. Venous: The inferior vena cava is normal in size with greater than 50% respiratory variability, suggesting right atrial pressure of 3 mmHg. IAS/Shunts: No atrial level shunt detected by color flow Doppler.  LEFT VENTRICLE PLAX 2D LVIDd:         4.70 cm     Diastology LVIDs:         3.10 cm     LV e' medial:    11.60 cm/s LV PW:         1.00 cm     LV E/e' medial:  7.7 LV IVS:        1.00 cm     LV e' lateral:   7.94 cm/s LVOT diam:     1.90 cm     LV E/e' lateral: 11.2 LV SV:         62 LV SV Index:   38 LVOT Area:     2.84 cm  LV Volumes (MOD) LV vol d, MOD A2C: 52.7 ml LV vol d, MOD A4C: 51.9 ml LV vol s, MOD A4C: 17.7 ml LV SV MOD A4C:     51.9 ml RIGHT VENTRICLE RV Basal diam:  2.30 cm RV S prime:     14.50 cm/s TAPSE (M-mode): 2.6 cm LEFT ATRIUM           Index        RIGHT ATRIUM           Index LA diam:      3.60 cm 2.18 cm/m   RA Area:     16.00 cm LA Vol (A4C): 39.6 ml 23.95 ml/m  RA Volume:   38.20 ml  23.10 ml/m  AORTIC VALVE                  PULMONIC VALVE AV Area (Vmax): 2.84 cm     PV Vmax:        1.06 m/s AV Vmax:        141.00 cm/s  PV Peak grad:   4.5 mmHg AV Peak Grad:   8.0 mmHg     RVOT Peak grad: 1 mmHg LVOT Vmax:      141.00 cm/s LVOT Vmean:     94.200 cm/s LVOT VTI:       0.219 m  AORTA Ao Root diam: 2.90 cm MITRAL VALVE               TRICUSPID VALVE MV Area (PHT): 4.39 cm    TR Peak grad:   27.7 mmHg MV Decel Time: 173 msec    TR Vmax:        263.00 cm/s MV E velocity: 89.10 cm/s MV A velocity: 70.20 cm/s  SHUNTS MV E/A ratio:  1.27        Systemic VTI:  0.22 m  Systemic Diam: 1.90 cm Fransico Him MD Electronically signed by Fransico Him MD Signature Date/Time: 03/21/2022/5:14:14 PM    Final    US Venous Img Lower Bilateral (DVT)  Result Date: 03/21/2022 CLINICAL DATA:  Bilateral lower extremity edema for a couple of weeks. EXAM: BILATERAL LOWER EXTREMITY VENOUS DOPPLER ULTRASOUND TECHNIQUE: Gray-scale sonography with graded compression, as well as color Doppler and duplex ultrasound were performed to evaluate the lower extremity deep venous systems from the level of the common femoral vein and including the common femoral, femoral, profunda femoral, popliteal and calf veins including the posterior tibial, peroneal and gastrocnemius veins when visible. The superficial great saphenous vein was also interrogated. Spectral Doppler was utilized to evaluate flow at rest and with distal augmentation maneuvers in the common femoral, femoral and popliteal veins. COMPARISON:  None Available. FINDINGS: RIGHT LOWER EXTREMITY Common Femoral Vein: No evidence of thrombus. Normal compressibility, respiratory phasicity and response to augmentation. Saphenofemoral Junction: No evidence of thrombus. Normal compressibility and flow on color Doppler imaging. Profunda Femoral Vein: No evidence of thrombus. Normal compressibility and flow on color Doppler imaging. Femoral Vein: No evidence of thrombus. Normal compressibility,  respiratory phasicity and response to augmentation. Popliteal Vein: No evidence of thrombus. Normal compressibility, respiratory phasicity and response to augmentation. Calf Veins: Not well visualized. Other Findings:  None. LEFT LOWER EXTREMITY Common Femoral Vein: No evidence of thrombus. Normal compressibility, respiratory phasicity and response to augmentation. Saphenofemoral Junction: No evidence of thrombus. Normal compressibility and flow on color Doppler imaging. Profunda Femoral Vein: No evidence of thrombus. Normal compressibility and flow on color Doppler imaging. Femoral Vein: No evidence of thrombus. Normal compressibility, respiratory phasicity and response to augmentation. Popliteal Vein: No evidence of thrombus. Normal compressibility, respiratory phasicity and response to augmentation. Calf Veins: Not well visualized. Other Findings:  None. IMPRESSION: No evidence of deep venous thrombosis in either lower extremity. The bilateral calf veins were not well visualized. Electronically Signed   By: Yvonne Kendall M.D.   On: 03/21/2022 13:41   DG Tibia/Fibula Right  Result Date: 03/21/2022 CLINICAL DATA:  Bilateral lower extremity edema and pain. History of right knee ORIF. EXAM: RIGHT TIBIA AND FIBULA - 2 VIEW; RIGHT ANKLE - 2 VIEW COMPARISON:  Right knee radiographs 12/14/2021 FINDINGS: There is diffuse decreased bone mineralization. Partial visualization of distal fibular lateral plate and screw fixation. Stent grafts overlying the superficial femoral artery and popliteal artery. New transverse curvilinear sclerosis with subtle central curvilinear lucency within the mid to lateral mid to posterior aspect of the lateral proximal tibial metaphysis and physis, suggesting healing of a subacute possible stress fracture. Mild talonavicular and navicular-cuneiform joint space narrowing and peripheral osteophytosis. There is sclerosis and apparent mild callus formation overlying the proximal metaphyses of  the least first through third metatarsals on lateral view, raising suspicion for healing subacute fractures. Additional sclerosis within the navicular and cuneiforms on lateral view may also represent subacute to chronic healing fractures. There are curvilinear lucencies overlying the posterosuperior aspect of the calcaneus that appear to extend into the overlying soft tissues and are not favored to represent fracture lines. Recommend clinical correlation for point tenderness. IMPRESSION: Compared to 12/14/2021: 1. Transverse sclerosis indicating healing of a subacute proximal lateral tibial nondisplaced fracture. 2. Additional sclerosis and apparent mild callus formation overlying the proximal metaphyses of the first through third metatarsals on lateral view, raising suspicion for healing subacute fractures. 3. Additional sclerosis within the navicular and cuneiforms on lateral view may also represent subacute to chronic healing  fractures. Recommend clinical correlation. Electronically Signed   By: Yvonne Kendall M.D.   On: 03/21/2022 12:41   DG Ankle 2 Views Right  Result Date: 03/21/2022 CLINICAL DATA:  Bilateral lower extremity edema and pain. History of right knee ORIF. EXAM: RIGHT TIBIA AND FIBULA - 2 VIEW; RIGHT ANKLE - 2 VIEW COMPARISON:  Right knee radiographs 12/14/2021 FINDINGS: There is diffuse decreased bone mineralization. Partial visualization of distal fibular lateral plate and screw fixation. Stent grafts overlying the superficial femoral artery and popliteal artery. New transverse curvilinear sclerosis with subtle central curvilinear lucency within the mid to lateral mid to posterior aspect of the lateral proximal tibial metaphysis and physis, suggesting healing of a subacute possible stress fracture. Mild talonavicular and navicular-cuneiform joint space narrowing and peripheral osteophytosis. There is sclerosis and apparent mild callus formation overlying the proximal metaphyses of the least  first through third metatarsals on lateral view, raising suspicion for healing subacute fractures. Additional sclerosis within the navicular and cuneiforms on lateral view may also represent subacute to chronic healing fractures. There are curvilinear lucencies overlying the posterosuperior aspect of the calcaneus that appear to extend into the overlying soft tissues and are not favored to represent fracture lines. Recommend clinical correlation for point tenderness. IMPRESSION: Compared to 12/14/2021: 1. Transverse sclerosis indicating healing of a subacute proximal lateral tibial nondisplaced fracture. 2. Additional sclerosis and apparent mild callus formation overlying the proximal metaphyses of the first through third metatarsals on lateral view, raising suspicion for healing subacute fractures. 3. Additional sclerosis within the navicular and cuneiforms on lateral view may also represent subacute to chronic healing fractures. Recommend clinical correlation. Electronically Signed   By: Yvonne Kendall M.D.   On: 03/21/2022 12:41   DG Tibia/Fibula Left  Result Date: 03/21/2022 CLINICAL DATA:  Bilateral lower extremity edema and pain. EXAM: LEFT TIBIA AND FIBULA - 2 VIEW; LEFT ANKLE - 2 VIEW COMPARISON:  None available FINDINGS: There is diffuse decreased bone mineralization. Mild medial compartment of the knee joint space narrowing. No knee joint effusion. Lung superficial femoral artery and popliteal vascular stent graft. There is a transverse lucent fracture line within the distal tibial metaphysis with 5 mm medial cortical step-off at the distal medial aspect of the fracture on frontal view and 4 mm posterior cortical step-off at the distal posterior aspect of the fracture on lateral view. Mild adjacent healing sclerosis. There is curvilinear lucency with surrounding sclerosis suggesting a subacute nondisplaced fracture of the body of the calcaneus extending from the superior cortex through the plantar  cortex. IMPRESSION: 1. Subacute appearing mildly displaced fracture of the distal tibial metaphysis. 2. Subacute nondisplaced fracture of the body of the calcaneus. Electronically Signed   By: Yvonne Kendall M.D.   On: 03/21/2022 12:30   DG Ankle 2 Views Left  Result Date: 03/21/2022 CLINICAL DATA:  Bilateral lower extremity edema and pain. EXAM: LEFT TIBIA AND FIBULA - 2 VIEW; LEFT ANKLE - 2 VIEW COMPARISON:  None available FINDINGS: There is diffuse decreased bone mineralization. Mild medial compartment of the knee joint space narrowing. No knee joint effusion. Lung superficial femoral artery and popliteal vascular stent graft. There is a transverse lucent fracture line within the distal tibial metaphysis with 5 mm medial cortical step-off at the distal medial aspect of the fracture on frontal view and 4 mm posterior cortical step-off at the distal posterior aspect of the fracture on lateral view. Mild adjacent healing sclerosis. There is curvilinear lucency with surrounding sclerosis suggesting a subacute nondisplaced fracture  of the body of the calcaneus extending from the superior cortex through the plantar cortex. IMPRESSION: 1. Subacute appearing mildly displaced fracture of the distal tibial metaphysis. 2. Subacute nondisplaced fracture of the body of the calcaneus. Electronically Signed   By: Yvonne Kendall M.D.   On: 03/21/2022 12:30    Positive ROS: All other systems have been reviewed and were otherwise negative with the exception of those mentioned in the HPI and as above.  Physical Exam: General: Alert, no acute distress Cardiovascular: No pedal edema Respiratory: No cyanosis, no use of accessory musculature GI: No organomegaly, abdomen is soft and non-tender Skin: No lesions in the area of chief complaint Neurologic: Sensation intact distally Psychiatric: Patient is competent for consent with normal mood and affect Lymphatic: No axillary or cervical lymphadenopathy  MUSCULOSKELETAL:   Right knee, incision is well-healed, reduced range of motion approximately 5 degrees shy of full extension to 30 degrees of flexion, able to form a straight leg raise, ankle held in plantarflexion able to actively dorsiflex Left lower extremity: Full knee range of motion mild to moderate lower extremity edema, ankle held in plantarflexion able to dorsiflex actively    Assessment: Status post right distal femur ORIF November 2023 with Dr. Doreatha Martin, also with multiple subacute bilateral lower extremity fractures and muscle weakness/atrophy  Plan: -Recommend 2 view right femur x-ray to assess healing of the distal femur fracture which was fixed in November -PT/OT: Assuming right distal femur x-ray shows appropriate healing, patient can be weightbearing as tolerated.  She has notable weakness and atrophy in bilateral lower extremities.  She may benefit from home health PT. -Recommend follow-up with her orthopedic surgeon, Dr. Doreatha Martin in 6 weeks.    Renee Harder, MD    03/22/2022 7:50 AM

## 2022-03-22 NOTE — Discharge Summary (Signed)
Physician Discharge Summary  Beth Carroll [id number] DOB: 1970-04-17 DOA: 03/19/2022  PCP: Baxter Hire, MD  Admit date: 03/19/2022 Discharge date: 03/23/2022  Admitted From: Home  Disposition:  Home with home health  Recommendations for Outpatient Follow-up:  Follow up with PCP in 1-2 weeks Follow up with ortho as directed  Home Health:Yes PT OT RN Equipment/Devices:None   Discharge Condition:Stable  CODE STATUS:FULL Diet recommendation: Reg  Brief/Interim Summary:  52 y.o. female with medical history significant for coronary artery disease, type 2 diabetes mellitus, HFrEF, dyslipidemia and hypertension as well as PAD, who presented to the emergency room with acute onset of generalized weakness and dizziness and was noted to be hypotensive with BP in the 50s .  She denied any nausea or vomiting or diarrhea or abdominal pain.  She admitted to urinary frequency and urgency as well as dysuria.  She has been having diminished urine output but her appetite has been within normal.  She admits to mild cough over the last couple of nights without wheezing or dyspnea.  No fever or chills.  No chest pain or palpitations.  She went to her PCP clinic today when she was noted to be hypotensive.  She denies any paresthesias or focal muscle weakness.   Treated for UTI.  Symptoms improved.  Patient has decreased ability to ambulate.  Plan film x-rays of bilateral lower legs and ankles revealed multiple subacute fractures in various stages of healing.  Orthopedics involved.  No acute surgical management.  Recommended plain film x-ray of distal femur to assess stability of ORIF.  This was done and results are reassuring.  Patient is appropriate for discharge at this time.    Discharge Diagnoses:  Principal Problem:   Hypotension Active Problems:   Acute lower UTI (urinary tract infection)   Hypokalemia   Type 2 diabetes mellitus without complications (HCC)   Essential hypertension   GERD  without esophagitis   Dyslipidemia   Dehydration  * Hypotension Suspect secondary to intravascular volume depletion in the setting of suspected UTI.  Patient improved however still very symptomatic upon rising and ambulation. Hypotension has improved Plan: Resolved.  Completed treatment for UTI.  Patient appears to have very labile blood pressures.  As such we will discontinue all blood pressure regimen on discharge.  Encourage patient to check blood pressure at home and record and bring to next PCP visit.  Home PT and OT ordered.    Acute lower UTI (urinary tract infection) Completed 3 days treatment  Discharge Instructions  Discharge Instructions     Diet - low sodium heart healthy   Complete by: As directed    Increase activity slowly   Complete by: As directed    No wound care   Complete by: As directed       Allergies as of 03/22/2022   No Known Allergies      Medication List     STOP taking these medications    B12 5000 MCG Subl   clotrimazole 1 % cream Commonly known as: LOTRIMIN   hydrochlorothiazide 25 MG tablet Commonly known as: HYDRODIURIL   loperamide 2 MG tablet Commonly known as: IMODIUM A-D   losartan 25 MG tablet Commonly known as: COZAAR   methocarbamol 500 MG tablet Commonly known as: ROBAXIN   oxyCODONE-acetaminophen 5-325 MG tablet Commonly known as: Percocet       TAKE these medications    acetaminophen 650 MG CR tablet Commonly known as: TYLENOL Take 650 mg by mouth every 8 (  eight) hours as needed for pain.   albuterol 108 (90 Base) MCG/ACT inhaler Commonly known as: VENTOLIN HFA Inhale 2 puffs into the lungs every 6 (six) hours as needed for wheezing or shortness of breath.   aspirin EC 81 MG tablet Take 81 mg by mouth daily. Swallow whole.   atorvastatin 20 MG tablet Commonly known as: LIPITOR Take 1 tablet (20 mg total) by mouth daily.   barrier cream Crea Commonly known as: non-specified Apply 1 Application  topically 2 (two) times daily as needed.   carvedilol 3.125 MG tablet Commonly known as: COREG Take 1 tablet (3.125 mg total) by mouth 2 (two) times daily with a meal.   CENTRUM SILVER 50+WOMEN PO Take 1 tablet by mouth daily.   Eliquis 5 MG Tabs tablet Generic drug: apixaban TAKE 1 TABLET BY MOUTH TWICE A DAY   Farxiga 10 MG Tabs tablet Generic drug: dapagliflozin propanediol TAKE 1 TABLET BY MOUTH EVERY DAY BEFORE BREAKFAST What changed: See the new instructions.   feeding supplement Liqd Take 237 mLs by mouth 3 (three) times daily between meals.   nutrition supplement (JUVEN) Pack Take 1 packet by mouth 2 (two) times daily between meals.   fluconazole 100 MG tablet Commonly known as: DIFLUCAN Take 1 tablet (100 mg total) by mouth daily for 6 days. Start taking on: March 23, 2022   furosemide 20 MG tablet Commonly known as: LASIX Take 1 tablet (20 mg total) by mouth daily.   Gerhardt's butt cream Crea Apply 1 Application topically 2 (two) times daily.   Lantus SoloStar 100 UNIT/ML Solostar Pen Generic drug: insulin glargine Inject 25 Units into the skin daily.   pantoprazole 40 MG tablet Commonly known as: PROTONIX Take 1 tablet (40 mg total) by mouth daily.   Semaglutide(0.25 or 0.'5MG'$ /DOS) 2 MG/3ML Sopn Inject 0.5 mg into the skin 3 (three) times daily. Before each meal   tiotropium 18 MCG inhalation capsule Commonly known as: SPIRIVA Place 1 capsule into inhaler and inhale daily.   traMADol 50 MG tablet Commonly known as: ULTRAM Take 50 mg by mouth every 6 (six) hours as needed for severe pain or moderate pain.   Vitamin D (Ergocalciferol) 1.25 MG (50000 UNIT) Caps capsule Commonly known as: DRISDOL Take 50,000 Units by mouth once a week.        Follow-up Information     [facility].   Specialty: Home Health Services Why: They will follow up with you for your home health needs. Contact information: [place] [place] [address] [phone]                No Known Allergies  Consultations: Orthopedics   Procedures/Studies: DG FEMUR, MIN 2 VIEWS RIGHT  Result Date: 03/22/2022 CLINICAL DATA:  Ankle pain radiating to the knee EXAM: RIGHT FEMUR 2 VIEWS COMPARISON:  12/14/2021 FINDINGS: Distant ORIF distal femur fracture. No complicating feature. Small knee joint effusion without gross degenerative change. Extensive arterial stents from the common femoral region through the popliteal region. IMPRESSION: Distant ORIF of distal femur fracture. No complicating feature. Small knee joint effusion. Long segment arterial stenting. Electronically Signed   By: Nelson Chimes M.D.   On: 03/22/2022 11:24   ECHOCARDIOGRAM COMPLETE  Result Date: 03/21/2022    ECHOCARDIOGRAM REPORT   Patient Name:   DARSHAY CHIRDON Date of Exam: 03/21/2022 Medical Rec #:  [id number]     Height:       66.0 in Accession #:  [id number]    Weight:       127.9 lb Date of Birth:  1970/05/29     BSA:          1.654 m Patient Age:    52 years      BP:           166/86 mmHg Patient Gender: F             HR:           80 bpm. Exam Location:  [facility] Procedure: 2D Echo Indications:     CHF I50.21  History:         Patient has prior history of Echocardiogram examinations, most                  recent 06/26/2021.  Sonographer:     Kathlen Brunswick RDCS Referring Phys:  [id number] Sidney Ace Diagnosing Phys: Fransico Him MD IMPRESSIONS  1. Left ventricular ejection fraction, by estimation, is 55 to 60%. The left ventricle has normal function. The left ventricle demonstrates regional wall motion abnormalities (see scoring diagram/findings for description). Left ventricular diastolic parameters were normal. There is akinesis of the left ventricular, apical segment. There is akinesis of the left ventricular, apical septal wall and anterior wall.  2. Right ventricular systolic function is normal. The right ventricular size is normal. There is  normal pulmonary artery systolic pressure. The estimated right ventricular systolic pressure is [id number] mmHg.  3. The mitral valve is normal in structure. Trivial mitral valve regurgitation. No evidence of mitral stenosis.  4. The aortic valve is normal in structure. Aortic valve regurgitation is not visualized. Aortic valve sclerosis/calcification is present, without any evidence of aortic stenosis. Aortic valve Vmax measures 1.41 m/s.  5. The inferior vena cava is normal in size with greater than 50% respiratory variability, suggesting right atrial pressure of 3 mmHg. FINDINGS  Left Ventricle: Left ventricular ejection fraction, by estimation, is 55 to 60%. The left ventricle has normal function. The left ventricle demonstrates regional wall motion abnormalities. The left ventricular internal cavity size was normal in size. There is no left ventricular hypertrophy. Left ventricular diastolic parameters were normal. Normal left ventricular filling pressure. Right Ventricle: The right ventricular size is normal. No increase in right ventricular wall thickness. Right ventricular systolic function is normal. There is normal pulmonary artery systolic pressure. The tricuspid regurgitant velocity is 2.63 m/s, and  with an assumed right atrial pressure of 3 mmHg, the estimated right ventricular systolic pressure is [id number] mmHg. Left Atrium: Left atrial size was normal in size. Right Atrium: Right atrial size was normal in size. Pericardium: There is no evidence of pericardial effusion. Mitral Valve: The mitral valve is normal in structure. Trivial mitral valve regurgitation. No evidence of mitral valve stenosis. Tricuspid Valve: The tricuspid valve is normal in structure. Tricuspid valve regurgitation is trivial. No evidence of tricuspid stenosis. Aortic Valve: The aortic valve is normal in structure. Aortic valve regurgitation is not visualized. Aortic valve sclerosis/calcification is present, without any evidence of aortic  stenosis. Aortic valve peak gradient measures 8.0 mmHg. Pulmonic Valve: The pulmonic valve was normal in structure. Pulmonic valve regurgitation is not visualized. No evidence of pulmonic stenosis. Aorta: The aortic root is normal in size and structure. Venous: The inferior vena cava is normal in size with greater than 50% respiratory variability, suggesting right atrial pressure of 3 mmHg. IAS/Shunts: No atrial level shunt detected by color flow Doppler.  LEFT VENTRICLE PLAX 2D LVIDd:  4.70 cm     Diastology LVIDs:         3.10 cm     LV e' medial:    11.60 cm/s LV PW:         1.00 cm     LV E/e' medial:  7.7 LV IVS:        1.00 cm     LV e' lateral:   7.94 cm/s LVOT diam:     1.90 cm     LV E/e' lateral: 11.2 LV SV:         62 LV SV Index:   38 LVOT Area:     2.84 cm  LV Volumes (MOD) LV vol d, MOD A2C: 52.7 ml LV vol d, MOD A4C: 51.9 ml LV vol s, MOD A4C: 17.7 ml LV SV MOD A4C:     51.9 ml RIGHT VENTRICLE RV Basal diam:  2.30 cm RV S prime:     14.50 cm/s TAPSE (M-mode): 2.6 cm LEFT ATRIUM           Index        RIGHT ATRIUM           Index LA diam:      3.60 cm 2.18 cm/m   RA Area:     16.00 cm LA Vol (A4C): 39.6 ml 23.95 ml/m  RA Volume:   38.20 ml  23.10 ml/m  AORTIC VALVE                 PULMONIC VALVE AV Area (Vmax): 2.84 cm     PV Vmax:        1.06 m/s AV Vmax:        141.00 cm/s  PV Peak grad:   4.5 mmHg AV Peak Grad:   8.0 mmHg     RVOT Peak grad: 1 mmHg LVOT Vmax:      141.00 cm/s LVOT Vmean:     94.200 cm/s LVOT VTI:       0.219 m  AORTA Ao Root diam: 2.90 cm MITRAL VALVE               TRICUSPID VALVE MV Area (PHT): 4.39 cm    TR Peak grad:   27.7 mmHg MV Decel Time: 173 msec    TR Vmax:        263.00 cm/s MV E velocity: 89.10 cm/s MV A velocity: 70.20 cm/s  SHUNTS MV E/A ratio:  1.27        Systemic VTI:  0.22 m                            Systemic Diam: 1.90 cm Fransico Him MD Electronically signed by Fransico Him MD Signature Date/Time: 03/21/2022/5:14:14 PM    Final    US Venous Img  Lower Bilateral (DVT)  Result Date: 03/21/2022 CLINICAL DATA:  Bilateral lower extremity edema for a couple of weeks. EXAM: BILATERAL LOWER EXTREMITY VENOUS DOPPLER ULTRASOUND TECHNIQUE: Gray-scale sonography with graded compression, as well as color Doppler and duplex ultrasound were performed to evaluate the lower extremity deep venous systems from the level of the common femoral vein and including the common femoral, femoral, profunda femoral, popliteal and calf veins including the posterior tibial, peroneal and gastrocnemius veins when visible. The superficial great saphenous vein was also interrogated. Spectral Doppler was utilized to evaluate flow at rest and with distal augmentation maneuvers in the common femoral, femoral and popliteal veins. COMPARISON:  None  Available. FINDINGS: RIGHT LOWER EXTREMITY Common Femoral Vein: No evidence of thrombus. Normal compressibility, respiratory phasicity and response to augmentation. Saphenofemoral Junction: No evidence of thrombus. Normal compressibility and flow on color Doppler imaging. Profunda Femoral Vein: No evidence of thrombus. Normal compressibility and flow on color Doppler imaging. Femoral Vein: No evidence of thrombus. Normal compressibility, respiratory phasicity and response to augmentation. Popliteal Vein: No evidence of thrombus. Normal compressibility, respiratory phasicity and response to augmentation. Calf Veins: Not well visualized. Other Findings:  None. LEFT LOWER EXTREMITY Common Femoral Vein: No evidence of thrombus. Normal compressibility, respiratory phasicity and response to augmentation. Saphenofemoral Junction: No evidence of thrombus. Normal compressibility and flow on color Doppler imaging. Profunda Femoral Vein: No evidence of thrombus. Normal compressibility and flow on color Doppler imaging. Femoral Vein: No evidence of thrombus. Normal compressibility, respiratory phasicity and response to augmentation. Popliteal Vein: No evidence  of thrombus. Normal compressibility, respiratory phasicity and response to augmentation. Calf Veins: Not well visualized. Other Findings:  None. IMPRESSION: No evidence of deep venous thrombosis in either lower extremity. The bilateral calf veins were not well visualized. Electronically Signed   By: Yvonne Kendall M.D.   On: 03/21/2022 13:41   DG Tibia/Fibula Right  Result Date: 03/21/2022 CLINICAL DATA:  Bilateral lower extremity edema and pain. History of right knee ORIF. EXAM: RIGHT TIBIA AND FIBULA - 2 VIEW; RIGHT ANKLE - 2 VIEW COMPARISON:  Right knee radiographs 12/14/2021 FINDINGS: There is diffuse decreased bone mineralization. Partial visualization of distal fibular lateral plate and screw fixation. Stent grafts overlying the superficial femoral artery and popliteal artery. New transverse curvilinear sclerosis with subtle central curvilinear lucency within the mid to lateral mid to posterior aspect of the lateral proximal tibial metaphysis and physis, suggesting healing of a subacute possible stress fracture. Mild talonavicular and navicular-cuneiform joint space narrowing and peripheral osteophytosis. There is sclerosis and apparent mild callus formation overlying the proximal metaphyses of the least first through third metatarsals on lateral view, raising suspicion for healing subacute fractures. Additional sclerosis within the navicular and cuneiforms on lateral view may also represent subacute to chronic healing fractures. There are curvilinear lucencies overlying the posterosuperior aspect of the calcaneus that appear to extend into the overlying soft tissues and are not favored to represent fracture lines. Recommend clinical correlation for point tenderness. IMPRESSION: Compared to 12/14/2021: 1. Transverse sclerosis indicating healing of a subacute proximal lateral tibial nondisplaced fracture. 2. Additional sclerosis and apparent mild callus formation overlying the proximal metaphyses of the  first through third metatarsals on lateral view, raising suspicion for healing subacute fractures. 3. Additional sclerosis within the navicular and cuneiforms on lateral view may also represent subacute to chronic healing fractures. Recommend clinical correlation. Electronically Signed   By: Yvonne Kendall M.D.   On: 03/21/2022 12:41   DG Ankle 2 Views Right  Result Date: 03/21/2022 CLINICAL DATA:  Bilateral lower extremity edema and pain. History of right knee ORIF. EXAM: RIGHT TIBIA AND FIBULA - 2 VIEW; RIGHT ANKLE - 2 VIEW COMPARISON:  Right knee radiographs 12/14/2021 FINDINGS: There is diffuse decreased bone mineralization. Partial visualization of distal fibular lateral plate and screw fixation. Stent grafts overlying the superficial femoral artery and popliteal artery. New transverse curvilinear sclerosis with subtle central curvilinear lucency within the mid to lateral mid to posterior aspect of the lateral proximal tibial metaphysis and physis, suggesting healing of a subacute possible stress fracture. Mild talonavicular and navicular-cuneiform joint space narrowing and peripheral osteophytosis. There is sclerosis and apparent mild callus  formation overlying the proximal metaphyses of the least first through third metatarsals on lateral view, raising suspicion for healing subacute fractures. Additional sclerosis within the navicular and cuneiforms on lateral view may also represent subacute to chronic healing fractures. There are curvilinear lucencies overlying the posterosuperior aspect of the calcaneus that appear to extend into the overlying soft tissues and are not favored to represent fracture lines. Recommend clinical correlation for point tenderness. IMPRESSION: Compared to 12/14/2021: 1. Transverse sclerosis indicating healing of a subacute proximal lateral tibial nondisplaced fracture. 2. Additional sclerosis and apparent mild callus formation overlying the proximal metaphyses of the first  through third metatarsals on lateral view, raising suspicion for healing subacute fractures. 3. Additional sclerosis within the navicular and cuneiforms on lateral view may also represent subacute to chronic healing fractures. Recommend clinical correlation. Electronically Signed   By: Yvonne Kendall M.D.   On: 03/21/2022 12:41   DG Tibia/Fibula Left  Result Date: 03/21/2022 CLINICAL DATA:  Bilateral lower extremity edema and pain. EXAM: LEFT TIBIA AND FIBULA - 2 VIEW; LEFT ANKLE - 2 VIEW COMPARISON:  None available FINDINGS: There is diffuse decreased bone mineralization. Mild medial compartment of the knee joint space narrowing. No knee joint effusion. Lung superficial femoral artery and popliteal vascular stent graft. There is a transverse lucent fracture line within the distal tibial metaphysis with 5 mm medial cortical step-off at the distal medial aspect of the fracture on frontal view and 4 mm posterior cortical step-off at the distal posterior aspect of the fracture on lateral view. Mild adjacent healing sclerosis. There is curvilinear lucency with surrounding sclerosis suggesting a subacute nondisplaced fracture of the body of the calcaneus extending from the superior cortex through the plantar cortex. IMPRESSION: 1. Subacute appearing mildly displaced fracture of the distal tibial metaphysis. 2. Subacute nondisplaced fracture of the body of the calcaneus. Electronically Signed   By: Yvonne Kendall M.D.   On: 03/21/2022 12:30   DG Ankle 2 Views Left  Result Date: 03/21/2022 CLINICAL DATA:  Bilateral lower extremity edema and pain. EXAM: LEFT TIBIA AND FIBULA - 2 VIEW; LEFT ANKLE - 2 VIEW COMPARISON:  None available FINDINGS: There is diffuse decreased bone mineralization. Mild medial compartment of the knee joint space narrowing. No knee joint effusion. Lung superficial femoral artery and popliteal vascular stent graft. There is a transverse lucent fracture line within the distal tibial metaphysis  with 5 mm medial cortical step-off at the distal medial aspect of the fracture on frontal view and 4 mm posterior cortical step-off at the distal posterior aspect of the fracture on lateral view. Mild adjacent healing sclerosis. There is curvilinear lucency with surrounding sclerosis suggesting a subacute nondisplaced fracture of the body of the calcaneus extending from the superior cortex through the plantar cortex. IMPRESSION: 1. Subacute appearing mildly displaced fracture of the distal tibial metaphysis. 2. Subacute nondisplaced fracture of the body of the calcaneus. Electronically Signed   By: Yvonne Kendall M.D.   On: 03/21/2022 12:30   DG Chest Port 1 View  Result Date: 03/19/2022 CLINICAL DATA:  Sepsis. EXAM: PORTABLE CHEST 1 VIEW COMPARISON:  12/10/2021 FINDINGS: The heart size and mediastinal contours are within normal limits. Aortic atherosclerotic calcification incidentally noted. Both lungs are clear. The visualized skeletal structures are unremarkable. IMPRESSION: No active disease. Electronically Signed   By: Marlaine Hind M.D.   On: 03/19/2022 17:46      Subjective: Seen and examined on the day of discharge.  Stable no distress.  Appropriate for discharge  home.  Discharge Exam: Vitals:   03/22/22 0801 03/22/22 1551  BP: (!) 152/77 (!) 98/51  Pulse: 75 70  Resp: 14 16  Temp: 98.4 F (36.9 C) 98.2 F (36.8 C)  SpO2: 96%    Vitals:   03/21/22 1937 03/22/22 0308 03/22/22 0801 03/22/22 1551  BP: 106/63 137/72 (!) 152/77 (!) 98/51  Pulse: 78 74 75 70  Resp: '16 20 14 16  '$ Temp: 98.6 F (37 C) 98.5 F (36.9 C) 98.4 F (36.9 C) 98.2 F (36.8 C)  TempSrc: Oral   Oral  SpO2: 100% 95% 96%   Weight:      Height:        General: Pt is alert, awake, not in acute distress Cardiovascular: RRR, S1/S2 +, no rubs, no gallops Respiratory: CTA bilaterally, no wheezing, no rhonchi Abdominal: Soft, NT, ND, bowel sounds + Extremities: no edema, no cyanosis    The results of  significant diagnostics from this hospitalization (including imaging, microbiology, ancillary and laboratory) are listed below for reference.     Microbiology: Recent Results (from the past 240 hour(s))  Urine Culture (for pregnant, neutropenic or urologic patients or patients with an indwelling urinary catheter)     Status: Abnormal (Preliminary result)   Collection Time: 03/19/22  4:54 PM   Specimen: Urine, Catheterized  Result Value Ref Range Status   Specimen Description   Final    URINE, CATHETERIZED Performed at [facility], [address], [place], [place] [address]    Special Requests   Final    NONE Performed at [facility], [address], [place], [place] [address]    Culture (A)  Final    >=100,000 COLONIES/mL GROUP B STREP(S.AGALACTIAE)ISOLATED 50,000 COLONIES/mL ESCHERICHIA COLI TESTING AGAINST S. AGALACTIAE NOT ROUTINELY PERFORMED DUE TO PREDICTABILITY OF AMP/PEN/VAN SUSCEPTIBILITY. 40,000 COLONIES/mL PSEUDOMONAS AERUGINOSA SUSCEPTIBILITIES TO FOLLOW Performed at [facility], [place] [address], [place], [place] [address]    Report Status PENDING  Incomplete   Organism ID, Bacteria ESCHERICHIA COLI (A)  Final      Susceptibility   Escherichia coli - MIC*    AMPICILLIN <=2 SENSITIVE Sensitive     CEFAZOLIN <=4 SENSITIVE Sensitive     CEFEPIME <=0.12 SENSITIVE Sensitive     CEFTRIAXONE <=0.25 SENSITIVE Sensitive     CIPROFLOXACIN <=0.25 SENSITIVE Sensitive     GENTAMICIN <=1 SENSITIVE Sensitive     IMIPENEM <=0.25 SENSITIVE Sensitive     NITROFURANTOIN <=16 SENSITIVE Sensitive     TRIMETH/SULFA <=20 SENSITIVE Sensitive     AMPICILLIN/SULBACTAM <=2 SENSITIVE Sensitive     PIP/TAZO <=4 SENSITIVE Sensitive     * 50,000 COLONIES/mL ESCHERICHIA COLI     Labs: BNP (last 3 results) Recent Labs    12/10/21 0158  BNP [id number]*   Basic Metabolic Panel: Recent Labs  Lab 03/19/22 1644 03/19/22 2240 03/20/22 0513  NA 137  --  141  K 2.9*   --  3.7  CL 98  --  106  CO2 25  --  26  GLUCOSE 227*  --  197*  BUN 11  --  9  CREATININE 0.62  --  0.41*  CALCIUM 8.8*  --  8.4*  MG  --  1.5*  --    Liver Function Tests: Recent Labs  Lab 03/19/22 1644  AST 17  ALT 9  ALKPHOS 96  BILITOT 0.8  PROT 6.7  ALBUMIN 2.7*   No results for input(s): "LIPASE", "AMYLASE" in the last 168 hours. No  results for input(s): "AMMONIA" in the last 168 hours. CBC: Recent Labs  Lab 03/19/22 1644 03/20/22 0513  WBC 7.2 6.3  NEUTROABS 5.4  --   HGB 10.3* 9.1*  HCT 33.1* 29.5*  MCV 84.2 84.5  PLT 395 356   Cardiac Enzymes: No results for input(s): "CKTOTAL", "CKMB", "CKMBINDEX", "TROPONINI" in the last 168 hours. BNP: Invalid input(s): "POCBNP" CBG: Recent Labs  Lab 03/21/22 1149 03/21/22 1707 03/21/22 2151 03/22/22 0758 03/22/22 1154  GLUCAP 239* 325* 352* 263* 238*   D-Dimer No results for input(s): "DDIMER" in the last 72 hours. Hgb A1c No results for input(s): "HGBA1C" in the last 72 hours. Lipid Profile No results for input(s): "CHOL", "HDL", "LDLCALC", "TRIG", "CHOLHDL", "LDLDIRECT" in the last 72 hours. Thyroid function studies No results for input(s): "TSH", "T4TOTAL", "T3FREE", "THYROIDAB" in the last 72 hours.  Invalid input(s): "FREET3" Anemia work up No results for input(s): "VITAMINB12", "FOLATE", "FERRITIN", "TIBC", "IRON", "RETICCTPCT" in the last 72 hours. Urinalysis    Component Value Date/Time   COLORURINE YELLOW (A) 03/19/2022 1654   APPEARANCEUR CLOUDY (A) 03/19/2022 1654   APPEARANCEUR Clear 08/30/2012 0047   LABSPEC 1.027 03/19/2022 1654   LABSPEC 1.014 08/30/2012 0047   PHURINE 6.0 03/19/2022 1654   GLUCOSEU >=500 (A) 03/19/2022 1654   GLUCOSEU >=500 08/30/2012 0047   HGBUR LARGE (A) 03/19/2022 1654   BILIRUBINUR NEGATIVE 03/19/2022 1654   BILIRUBINUR Negative 08/30/2012 0047   KETONESUR 20 (A) 03/19/2022 1654   PROTEINUR NEGATIVE 03/19/2022 1654   NITRITE NEGATIVE 03/19/2022 1654    LEUKOCYTESUR LARGE (A) 03/19/2022 1654   LEUKOCYTESUR 1+ 08/30/2012 0047   Sepsis Labs Recent Labs  Lab 03/19/22 1644 03/20/22 0513  WBC 7.2 6.3   Microbiology Recent Results (from the past 240 hour(s))  Urine Culture (for pregnant, neutropenic or urologic patients or patients with an indwelling urinary catheter)     Status: Abnormal (Preliminary result)   Collection Time: 03/19/22  4:54 PM   Specimen: Urine, Catheterized  Result Value Ref Range Status   Specimen Description   Final    URINE, CATHETERIZED Performed at [facility], [address] [place], [place] [address]    Special Requests   Final    NONE Performed at [facility], [address], [place], [place] [address]    Culture (A)  Final    >=100,000 COLONIES/mL GROUP B STREP(S.AGALACTIAE)ISOLATED 50,000 COLONIES/mL ESCHERICHIA COLI TESTING AGAINST S. AGALACTIAE NOT ROUTINELY PERFORMED DUE TO PREDICTABILITY OF AMP/PEN/VAN SUSCEPTIBILITY. 40,000 COLONIES/mL PSEUDOMONAS AERUGINOSA SUSCEPTIBILITIES TO FOLLOW Performed at [facility], [place] [address], [place], [place] [address]    Report Status PENDING  Incomplete   Organism ID, Bacteria ESCHERICHIA COLI (A)  Final      Susceptibility   Escherichia coli - MIC*    AMPICILLIN <=2 SENSITIVE Sensitive     CEFAZOLIN <=4 SENSITIVE Sensitive     CEFEPIME <=0.12 SENSITIVE Sensitive     CEFTRIAXONE <=0.25 SENSITIVE Sensitive     CIPROFLOXACIN <=0.25 SENSITIVE Sensitive     GENTAMICIN <=1 SENSITIVE Sensitive     IMIPENEM <=0.25 SENSITIVE Sensitive     NITROFURANTOIN <=16 SENSITIVE Sensitive     TRIMETH/SULFA <=20 SENSITIVE Sensitive     AMPICILLIN/SULBACTAM <=2 SENSITIVE Sensitive     PIP/TAZO <=4 SENSITIVE Sensitive     * 50,000 COLONIES/mL ESCHERICHIA COLI     Time coordinating discharge: Over 30 minutes  SIGNED:   Sidney Ace, MD  [facility] 03/23/2022 Pager   If 7PM-7AM, please  contact night-coverage

## 2022-03-22 NOTE — TOC CM/SW Note (Signed)
This CSW is working remote. Tried calling patient in the room for readmission prevention screen and to discuss home health recommendations. A female answered and stated she had gone down for xrays. Will try calling again later.  [place] Scrape, Welcome

## 2022-03-22 NOTE — Progress Notes (Signed)
After getting the patient in the wheelchair for discharge I checked her BP and got 72/52. MD notified. D/C cancelled. Rechecked BP and got 152/75. Dr Priscella Mann said to cancel bolus he just ordered

## 2022-03-22 NOTE — Progress Notes (Signed)
   03/22/22 1100  Spiritual Encounters  Type of Visit Initial  Care provided to: Patient  Referral source Code page  Reason for visit Code  OnCall Visit Yes  Spiritual Framework  Presenting Themes Significant life change  Interventions  Spiritual Care Interventions Made Mindfulness intervention   Chap responded to Rapid Code, Pt in the room, Interdisciplinary team working on patient. Beth Carroll present -offering calming presence. No further Spiritual care needed for now.

## 2022-03-22 NOTE — TOC Transition Note (Signed)
Transition of Care [facility]) - CM/SW Discharge Note   Patient Details  Name: Beth Carroll MRN: [id number] Date of Birth: 02/24/1970  Transition of [facility] CM/SW Contact:  Candie Chroman, LCSW Phone Number: 03/22/2022, 4:22 PM   Clinical Narrative:   Patient has orders to discharge home today. CSW called patient in the room, introduced role, and explained that therapy recommendations would be discussed. Patient is agreeable to home health services. [facility] is able to accept if RN added and patient has teachable caregiver for wound care. Patient confirmed this would be her daughter. No DME needs. No further concerns. Daughter will transport her home today. CSW signing off.  Final next level of care: [place] Barriers to Discharge: No Barriers Identified   Patient Goals and CMS Choice   Choice offered to / list presented to : Patient  Discharge Placement                  Patient to be transferred to facility by: Daughter   Patient and family notified of of transfer: 03/22/22  Discharge Plan and Services Additional resources added to the After Visit Summary for       Post Acute Care Choice: Home Health                    HH Arranged: RN, PT, OT [facility] Agency: [place] Date [place]: 03/22/22   Representative spoke with at [place]: [place] [facility]  Social Determinants of Health ([place]) Interventions SDOH Screenings   Food Insecurity: No Food Insecurity (03/19/2022)  Housing: Low Risk  (03/19/2022)  Transportation Needs: No Transportation Needs (03/19/2022)  Utilities: Not At Risk (03/19/2022)  Depression (PHQ2-9): Low Risk  (04/13/2021)  Tobacco Use: Medium Risk (03/19/2022)     Readmission Risk Interventions     No data to display

## 2022-03-23 LAB — URINE CULTURE: Culture: >=100000 — AB

## 2022-03-23 LAB — GLUCOSE, CAPILLARY: Glucose-Capillary: 334 mg/dL — ABNORMAL HIGH (ref 70–99)

## 2022-04-02 ENCOUNTER — Other Ambulatory Visit: Payer: Self-pay | Admitting: Medical

## 2022-04-06 ENCOUNTER — Ambulatory Visit: Payer: Medicaid Other | Admitting: Internal Medicine

## 2022-04-07 ENCOUNTER — Ambulatory Visit: Payer: Medicaid Other | Admitting: Internal Medicine

## 2022-04-21 ENCOUNTER — Emergency Department: Payer: Medicaid Other

## 2022-04-21 ENCOUNTER — Inpatient Hospital Stay: Payer: Medicaid Other

## 2022-04-21 ENCOUNTER — Inpatient Hospital Stay
Admission: EM | Admit: 2022-04-21 | Discharge: 2022-04-28 | DRG: 853 | Disposition: A | Discharge status: Home / Self Care | Payer: Medicaid Other | Attending: Internal Medicine | Admitting: Internal Medicine

## 2022-04-21 DIAGNOSIS — E1110 Type 2 diabetes mellitus with ketoacidosis without coma: Secondary | ICD-10-CM | POA: Diagnosis present

## 2022-04-21 DIAGNOSIS — Y712 Prosthetic and other implants, materials and accessory cardiovascular devices associated with adverse incidents: Secondary | ICD-10-CM | POA: Clinically undetermined

## 2022-04-21 DIAGNOSIS — Z87891 Personal history of nicotine dependence: Secondary | ICD-10-CM

## 2022-04-21 DIAGNOSIS — L89156 Pressure-induced deep tissue damage of sacral region: Secondary | ICD-10-CM | POA: Diagnosis present

## 2022-04-21 DIAGNOSIS — E1310 Other specified diabetes mellitus with ketoacidosis without coma: Principal | ICD-10-CM

## 2022-04-21 DIAGNOSIS — I2510 Atherosclerotic heart disease of native coronary artery without angina pectoris: Secondary | ICD-10-CM | POA: Diagnosis present

## 2022-04-21 DIAGNOSIS — T68XXXA Hypothermia, initial encounter: Secondary | ICD-10-CM | POA: Diagnosis not present

## 2022-04-21 DIAGNOSIS — Z681 Body mass index (BMI) 19 or less, adult: Secondary | ICD-10-CM

## 2022-04-21 DIAGNOSIS — E785 Hyperlipidemia, unspecified: Secondary | ICD-10-CM | POA: Diagnosis present

## 2022-04-21 DIAGNOSIS — I4891 Unspecified atrial fibrillation: Secondary | ICD-10-CM | POA: Diagnosis present

## 2022-04-21 DIAGNOSIS — I5022 Chronic systolic (congestive) heart failure: Secondary | ICD-10-CM | POA: Diagnosis present

## 2022-04-21 DIAGNOSIS — Z91138 Patient's unintentional underdosing of medication regimen for other reason: Secondary | ICD-10-CM

## 2022-04-21 DIAGNOSIS — T148XXA Other injury of unspecified body region, initial encounter: Secondary | ICD-10-CM | POA: Diagnosis not present

## 2022-04-21 DIAGNOSIS — L89616 Pressure-induced deep tissue damage of right heel: Secondary | ICD-10-CM | POA: Diagnosis present

## 2022-04-21 DIAGNOSIS — L89626 Pressure-induced deep tissue damage of left heel: Secondary | ICD-10-CM | POA: Diagnosis present

## 2022-04-21 DIAGNOSIS — R54 Age-related physical debility: Secondary | ICD-10-CM | POA: Diagnosis present

## 2022-04-21 DIAGNOSIS — A419 Sepsis, unspecified organism: Principal | ICD-10-CM | POA: Diagnosis present

## 2022-04-21 DIAGNOSIS — T82856A Stenosis of peripheral vascular stent, initial encounter: Secondary | ICD-10-CM | POA: Clinically undetermined

## 2022-04-21 DIAGNOSIS — Z8249 Family history of ischemic heart disease and other diseases of the circulatory system: Secondary | ICD-10-CM

## 2022-04-21 DIAGNOSIS — R4182 Altered mental status, unspecified: Secondary | ICD-10-CM | POA: Diagnosis present

## 2022-04-21 DIAGNOSIS — J449 Chronic obstructive pulmonary disease, unspecified: Secondary | ICD-10-CM | POA: Diagnosis present

## 2022-04-21 DIAGNOSIS — N390 Urinary tract infection, site not specified: Secondary | ICD-10-CM | POA: Diagnosis present

## 2022-04-21 DIAGNOSIS — E861 Hypovolemia: Secondary | ICD-10-CM | POA: Diagnosis present

## 2022-04-21 DIAGNOSIS — E8729 Other acidosis: Secondary | ICD-10-CM | POA: Diagnosis present

## 2022-04-21 DIAGNOSIS — Z7901 Long term (current) use of anticoagulants: Secondary | ICD-10-CM

## 2022-04-21 DIAGNOSIS — I771 Stricture of artery: Secondary | ICD-10-CM | POA: Diagnosis present

## 2022-04-21 DIAGNOSIS — L89612 Pressure ulcer of right heel, stage 2: Secondary | ICD-10-CM | POA: Diagnosis not present

## 2022-04-21 DIAGNOSIS — N179 Acute kidney failure, unspecified: Secondary | ICD-10-CM | POA: Diagnosis present

## 2022-04-21 DIAGNOSIS — I110 Hypertensive heart disease with heart failure: Secondary | ICD-10-CM | POA: Diagnosis present

## 2022-04-21 DIAGNOSIS — E1151 Type 2 diabetes mellitus with diabetic peripheral angiopathy without gangrene: Secondary | ICD-10-CM | POA: Diagnosis present

## 2022-04-21 DIAGNOSIS — R6520 Severe sepsis without septic shock: Secondary | ICD-10-CM | POA: Diagnosis present

## 2022-04-21 DIAGNOSIS — I70222 Atherosclerosis of native arteries of extremities with rest pain, left leg: Secondary | ICD-10-CM | POA: Clinically undetermined

## 2022-04-21 DIAGNOSIS — R680 Hypothermia, not associated with low environmental temperature: Secondary | ICD-10-CM | POA: Diagnosis present

## 2022-04-21 DIAGNOSIS — Z79899 Other long term (current) drug therapy: Secondary | ICD-10-CM

## 2022-04-21 DIAGNOSIS — T465X6A Underdosing of other antihypertensive drugs, initial encounter: Secondary | ICD-10-CM | POA: Diagnosis present

## 2022-04-21 DIAGNOSIS — I255 Ischemic cardiomyopathy: Secondary | ICD-10-CM | POA: Diagnosis present

## 2022-04-21 DIAGNOSIS — R197 Diarrhea, unspecified: Secondary | ICD-10-CM | POA: Diagnosis present

## 2022-04-21 DIAGNOSIS — Z9861 Coronary angioplasty status: Secondary | ICD-10-CM

## 2022-04-21 DIAGNOSIS — Z7982 Long term (current) use of aspirin: Secondary | ICD-10-CM

## 2022-04-21 DIAGNOSIS — Z993 Dependence on wheelchair: Secondary | ICD-10-CM

## 2022-04-21 DIAGNOSIS — I739 Peripheral vascular disease, unspecified: Secondary | ICD-10-CM | POA: Diagnosis present

## 2022-04-21 DIAGNOSIS — I743 Embolism and thrombosis of arteries of the lower extremities: Secondary | ICD-10-CM | POA: Clinically undetermined

## 2022-04-21 DIAGNOSIS — Z794 Long term (current) use of insulin: Secondary | ICD-10-CM | POA: Diagnosis not present

## 2022-04-21 DIAGNOSIS — Z95820 Peripheral vascular angioplasty status with implants and grafts: Secondary | ICD-10-CM

## 2022-04-21 DIAGNOSIS — R112 Nausea with vomiting, unspecified: Secondary | ICD-10-CM | POA: Diagnosis present

## 2022-04-21 DIAGNOSIS — R238 Other skin changes: Secondary | ICD-10-CM | POA: Diagnosis present

## 2022-04-21 DIAGNOSIS — T383X6A Underdosing of insulin and oral hypoglycemic [antidiabetic] drugs, initial encounter: Secondary | ICD-10-CM | POA: Diagnosis present

## 2022-04-21 DIAGNOSIS — Z7984 Long term (current) use of oral hypoglycemic drugs: Secondary | ICD-10-CM

## 2022-04-21 LAB — BASIC METABOLIC PANEL
BUN: 65 mg/dL — ABNORMAL HIGH (ref 6–20)
CO2: <7 mmol/L — ABNORMAL LOW (ref 22–32)
Calcium: 9.5 mg/dL (ref 8.9–10.3)
Chloride: 90 mmol/L — ABNORMAL LOW (ref 98–111)
Creatinine, Ser: 1.54 mg/dL — ABNORMAL HIGH (ref 0.44–1.00)
GFR, Estimated: 40 mL/min — ABNORMAL LOW (ref >60)
Glucose, Bld: 733 mg/dL (ref 70–99)
Potassium: 4.7 mmol/L (ref 3.5–5.1)
Sodium: 130 mmol/L — ABNORMAL LOW (ref 135–145)

## 2022-04-21 LAB — BLOOD GAS, VENOUS
Acid-base deficit: 24.7 mmol/L — ABNORMAL HIGH (ref 0.0–2.0)
Bicarbonate: 5.2 mmol/L — ABNORMAL LOW (ref 20.0–28.0)
O2 Saturation: 74.6 %
Patient temperature: 37
pCO2, Ven: 21 mmHg — ABNORMAL LOW (ref 44–60)
pH, Ven: 7 — CL (ref 7.25–7.43)
pO2, Ven: 52 mmHg — ABNORMAL HIGH (ref 32–45)

## 2022-04-21 LAB — CBC WITH DIFFERENTIAL/PLATELET
Abs Immature Granulocytes: 0.13 10*3/uL — ABNORMAL HIGH (ref 0.00–0.07)
Basophils Absolute: 0 10*3/uL (ref 0.0–0.1)
Basophils Relative: 0 %
Eosinophils Absolute: 0 10*3/uL (ref 0.0–0.5)
Eosinophils Relative: 0 %
HCT: 44.5 % (ref 36.0–46.0)
Hemoglobin: 12.5 g/dL (ref 12.0–15.0)
Immature Granulocytes: 1 %
Lymphocytes Relative: 6 %
Lymphs Abs: 1.5 10*3/uL (ref 0.7–4.0)
MCH: 26.4 pg (ref 26.0–34.0)
MCHC: 28.1 g/dL — ABNORMAL LOW (ref 30.0–36.0)
MCV: 94.1 fL (ref 80.0–100.0)
Monocytes Absolute: 0.6 10*3/uL (ref 0.1–1.0)
Monocytes Relative: 3 %
Neutro Abs: 20.8 10*3/uL — ABNORMAL HIGH (ref 1.7–7.7)
Neutrophils Relative %: 90 %
Platelets: 446 10*3/uL — ABNORMAL HIGH (ref 150–400)
RBC: 4.73 MIL/uL (ref 3.87–5.11)
RDW: 15.5 % (ref 11.5–15.5)
WBC: 23 10*3/uL — ABNORMAL HIGH (ref 4.0–10.5)
nRBC: 0 % (ref 0.0–0.2)

## 2022-04-21 LAB — BETA-HYDROXYBUTYRIC ACID: Beta-Hydroxybutyric Acid: >8 mmol/L — ABNORMAL HIGH (ref 0.05–0.27)

## 2022-04-21 LAB — CBG MONITORING, ED
Glucose-Capillary: >600 mg/dL (ref 70–99)
Glucose-Capillary: 559 mg/dL (ref 70–99)

## 2022-04-21 LAB — POC URINE PREG, ED: Preg Test, Ur: NEGATIVE — AB

## 2022-04-21 LAB — LACTIC ACID, PLASMA
Lactic Acid, Venous: 2.5 mmol/L (ref 0.5–1.9)
Lactic Acid, Venous: 2.4 mmol/L (ref 0.5–1.9)

## 2022-04-21 MED ORDER — DEXTROSE IN LACTATED RINGERS 5 % IV SOLN: INTRAVENOUS | Status: DC

## 2022-04-21 MED ORDER — ONDANSETRON HCL 4 MG/2ML IJ SOLN
4.0000 mg | Freq: Once | INTRAMUSCULAR | Status: AC
  Administered 2022-04-21: 4 mg via INTRAVENOUS
  Filled 2022-04-21: qty 2

## 2022-04-21 MED ORDER — ONDANSETRON HCL 4 MG/2ML IJ SOLN
4.0000 mg | Freq: Four times a day (QID) | INTRAMUSCULAR | Status: DC | PRN
  Administered 2022-04-22 – 2022-04-23 (×3): 4 mg via INTRAVENOUS
  Filled 2022-04-21 (×3): qty 2

## 2022-04-21 MED ORDER — ENOXAPARIN SODIUM 40 MG/0.4ML IJ SOSY
40.0000 mg | PREFILLED_SYRINGE | INTRAMUSCULAR | Status: DC
  Administered 2022-04-21: 40 mg via SUBCUTANEOUS
  Filled 2022-04-21: qty 0.4

## 2022-04-21 MED ORDER — POTASSIUM CHLORIDE 10 MEQ/100ML IV SOLN
10.0000 meq | INTRAVENOUS | Status: AC
  Administered 2022-04-21 (×2): 10 meq via INTRAVENOUS
  Filled 2022-04-21 (×2): qty 100

## 2022-04-21 MED ORDER — VANCOMYCIN HCL IN DEXTROSE 1-5 GM/200ML-% IV SOLN
1000.0000 mg | Freq: Once | INTRAVENOUS | Status: AC
  Administered 2022-04-21: 1000 mg via INTRAVENOUS
  Filled 2022-04-21: qty 200

## 2022-04-21 MED ORDER — INSULIN REGULAR(HUMAN) IN NACL 100-0.9 UT/100ML-% IV SOLN
INTRAVENOUS | Status: DC
  Administered 2022-04-21: 8.5 [IU]/h via INTRAVENOUS
  Administered 2022-04-22: 5 [IU]/h via INTRAVENOUS
  Filled 2022-04-21 (×2): qty 100

## 2022-04-21 MED ORDER — DOCUSATE SODIUM 100 MG PO CAPS: 100.0000 mg | ORAL_CAPSULE | Freq: Two times a day (BID) | ORAL | Status: DC | PRN

## 2022-04-21 MED ORDER — LACTATED RINGERS IV SOLN: INTRAVENOUS | Status: DC

## 2022-04-21 MED ORDER — POLYETHYLENE GLYCOL 3350 17 G PO PACK: 17.0000 g | PACK | Freq: Every day | ORAL | Status: DC | PRN

## 2022-04-21 MED ORDER — NOREPINEPHRINE 4 MG/250ML-% IV SOLN: 2.0000 ug/min | INTRAVENOUS | Status: DC

## 2022-04-21 MED ORDER — SODIUM BICARBONATE 8.4 % IV SOLN
100.0000 meq | Freq: Once | INTRAVENOUS | Status: AC
  Administered 2022-04-21: 100 meq via INTRAVENOUS
  Filled 2022-04-21: qty 50

## 2022-04-21 MED ORDER — PANTOPRAZOLE SODIUM 40 MG IV SOLR
40.0000 mg | INTRAVENOUS | Status: DC
  Administered 2022-04-21 – 2022-04-22 (×2): 40 mg via INTRAVENOUS
  Filled 2022-04-21 (×2): qty 10

## 2022-04-21 MED ORDER — DEXTROSE 50 % IV SOLN: 0.0000 mL | INTRAVENOUS | Status: DC | PRN

## 2022-04-21 MED ORDER — LACTATED RINGERS IV BOLUS
20.0000 mL/kg | Freq: Once | INTRAVENOUS | Status: AC
  Administered 2022-04-21: 1134 mL via INTRAVENOUS

## 2022-04-21 MED ORDER — LACTATED RINGERS IV BOLUS
1000.0000 mL | Freq: Once | INTRAVENOUS | Status: AC
  Administered 2022-04-21: 1000 mL via INTRAVENOUS

## 2022-04-21 MED ORDER — SODIUM CHLORIDE 0.9 % IV SOLN
2.0000 g | Freq: Once | INTRAVENOUS | Status: AC
  Administered 2022-04-21: 2 g via INTRAVENOUS
  Filled 2022-04-21: qty 12.5

## 2022-04-21 MED ORDER — SODIUM CHLORIDE 0.9 % IV SOLN: 250.0000 mL | INTRAVENOUS | Status: DC

## 2022-04-21 NOTE — ED Triage Notes (Signed)
To room 2 via [facility] from home with c/o weakness. Cbg [phone] with EMS.

## 2022-04-21 NOTE — ED Provider Notes (Signed)
   [facility] Provider Note    Event Date/Time   First MD Initiated Contact with Patient 04/21/22 2021     (approximate)   History   No chief complaint on file.   HPI {Remember to add pertinent medical, surgical, social, and/or OB history to HPI:1} Christienne Affeldt is a 52 y.o. female  ***       Physical Exam   Triage Vital Signs: ED Triage Vitals  Enc Vitals Group     BP      Pulse      Resp      Temp      Temp src      SpO2      Weight      Height      Head Circumference      Peak Flow      Pain Score      Pain Loc      Pain Edu?      Excl. in [place]?     Most recent vital signs: There were no vitals filed for this visit.  {Only need to document appropriate and relevant physical exam:1} General: Awake, no distress. *** CV:  Good peripheral perfusion. *** Resp:  Normal effort. *** Abd:  No distention. *** Other:  ***   ED Results / Procedures / Treatments   Labs (all labs ordered are listed, but only abnormal results are displayed) Labs Reviewed - No data to display   EKG  ***   RADIOLOGY *** {USE THE WORD "INTERPRETED"!! You MUST document your own interpretation of imaging, as well as the fact that you reviewed the radiologist's report!:1}   PROCEDURES:  Critical Care performed: Yes  CRITICAL CARE Performed by: Nance Pear   Total critical care time: *** minutes  Critical care time was exclusive of separately billable procedures and treating other patients.  Critical care was necessary to treat or prevent imminent or life-threatening deterioration.  Critical care was time spent personally by me on the following activities: development of treatment plan with patient and/or surrogate as well as nursing, discussions with consultants, evaluation of patient's response to treatment, examination of patient, obtaining history from patient or surrogate, ordering and performing treatments and interventions, ordering and  review of laboratory studies, ordering and review of radiographic studies, pulse oximetry and re-evaluation of patient's condition.   Procedures    MEDICATIONS ORDERED IN ED: Medications - No data to display   IMPRESSION / MDM / [place] / ED COURSE  I reviewed the triage vital signs and the nursing notes.                              Differential diagnosis includes, but is not limited to, ***  Patient's presentation is most consistent with {EM COPA:27473}   ***The patient is on the cardiac monitor to evaluate for evidence of arrhythmia and/or significant heart rate changes.  ***      FINAL CLINICAL IMPRESSION(S) / ED DIAGNOSES   Final diagnoses:  None     Rx / DC Orders   ED Discharge Orders     None        Note:  This document was prepared using Dragon voice recognition software and may include unintentional dictation errors.

## 2022-04-22 ENCOUNTER — Inpatient Hospital Stay: Payer: Medicaid Other

## 2022-04-22 ENCOUNTER — Other Ambulatory Visit: Payer: Self-pay

## 2022-04-22 ENCOUNTER — Other Ambulatory Visit (HOSPITAL_COMMUNITY): Payer: Self-pay

## 2022-04-22 DIAGNOSIS — T68XXXA Hypothermia, initial encounter: Secondary | ICD-10-CM

## 2022-04-22 DIAGNOSIS — E1310 Other specified diabetes mellitus with ketoacidosis without coma: Secondary | ICD-10-CM

## 2022-04-22 LAB — BASIC METABOLIC PANEL
Anion gap: 24 — ABNORMAL HIGH (ref 5–15)
Anion gap: 15 (ref 5–15)
Anion gap: 9 (ref 5–15)
Anion gap: 8 (ref 5–15)
BUN: 62 mg/dL — ABNORMAL HIGH (ref 6–20)
BUN: 58 mg/dL — ABNORMAL HIGH (ref 6–20)
BUN: 49 mg/dL — ABNORMAL HIGH (ref 6–20)
BUN: 45 mg/dL — ABNORMAL HIGH (ref 6–20)
CO2: 11 mmol/L — ABNORMAL LOW (ref 22–32)
CO2: 18 mmol/L — ABNORMAL LOW (ref 22–32)
CO2: 24 mmol/L (ref 22–32)
CO2: 26 mmol/L (ref 22–32)
Calcium: 8.5 mg/dL — ABNORMAL LOW (ref 8.9–10.3)
Calcium: 9.2 mg/dL (ref 8.9–10.3)
Calcium: 9.4 mg/dL (ref 8.9–10.3)
Calcium: 9.2 mg/dL (ref 8.9–10.3)
Chloride: 100 mmol/L (ref 98–111)
Chloride: 105 mmol/L (ref 98–111)
Chloride: 105 mmol/L (ref 98–111)
Chloride: 105 mmol/L (ref 98–111)
Creatinine, Ser: 1.21 mg/dL — ABNORMAL HIGH (ref 0.44–1.00)
Creatinine, Ser: 0.99 mg/dL (ref 0.44–1.00)
Creatinine, Ser: 0.7 mg/dL (ref 0.44–1.00)
Creatinine, Ser: 0.56 mg/dL (ref 0.44–1.00)
GFR, Estimated: 54 mL/min — ABNORMAL LOW (ref >60)
GFR, Estimated: >60 mL/min (ref >60)
GFR, Estimated: >60 mL/min (ref >60)
GFR, Estimated: >60 mL/min (ref >60)
Glucose, Bld: 488 mg/dL — ABNORMAL HIGH (ref 70–99)
Glucose, Bld: 303 mg/dL — ABNORMAL HIGH (ref 70–99)
Glucose, Bld: 190 mg/dL — ABNORMAL HIGH (ref 70–99)
Glucose, Bld: 183 mg/dL — ABNORMAL HIGH (ref 70–99)
Potassium: 3.7 mmol/L (ref 3.5–5.1)
Potassium: 3.1 mmol/L — ABNORMAL LOW (ref 3.5–5.1)
Potassium: 3.7 mmol/L (ref 3.5–5.1)
Potassium: 4.2 mmol/L (ref 3.5–5.1)
Sodium: 135 mmol/L (ref 135–145)
Sodium: 138 mmol/L (ref 135–145)
Sodium: 138 mmol/L (ref 135–145)
Sodium: 139 mmol/L (ref 135–145)

## 2022-04-22 LAB — URINALYSIS, ROUTINE W REFLEX MICROSCOPIC
Bacteria, UA: NONE SEEN
Bilirubin Urine: NEGATIVE
Glucose, UA: >=500 mg/dL — AB
Ketones, ur: 80 mg/dL — AB
Nitrite: NEGATIVE
Protein, ur: 100 mg/dL — AB
RBC / HPF: >50 RBC/hpf (ref 0–5)
Specific Gravity, Urine: 1.014 (ref 1.005–1.030)
Squamous Epithelial / HPF: NONE SEEN /HPF (ref 0–5)
WBC, UA: >50 WBC/hpf (ref 0–5)
pH: 5 (ref 5.0–8.0)

## 2022-04-22 LAB — CBC
HCT: 30.4 % — ABNORMAL LOW (ref 36.0–46.0)
Hemoglobin: 9.7 g/dL — ABNORMAL LOW (ref 12.0–15.0)
MCH: 27.2 pg (ref 26.0–34.0)
MCHC: 31.9 g/dL (ref 30.0–36.0)
MCV: 85.4 fL (ref 80.0–100.0)
Platelets: 301 10*3/uL (ref 150–400)
RBC: 3.56 MIL/uL — ABNORMAL LOW (ref 3.87–5.11)
RDW: 15.3 % (ref 11.5–15.5)
WBC: 19.6 10*3/uL — ABNORMAL HIGH (ref 4.0–10.5)
nRBC: 0 % (ref 0.0–0.2)

## 2022-04-22 LAB — GLUCOSE, CAPILLARY
Glucose-Capillary: 157 mg/dL — ABNORMAL HIGH (ref 70–99)
Glucose-Capillary: 158 mg/dL — ABNORMAL HIGH (ref 70–99)
Glucose-Capillary: 165 mg/dL — ABNORMAL HIGH (ref 70–99)
Glucose-Capillary: 170 mg/dL — ABNORMAL HIGH (ref 70–99)
Glucose-Capillary: 171 mg/dL — ABNORMAL HIGH (ref 70–99)
Glucose-Capillary: 173 mg/dL — ABNORMAL HIGH (ref 70–99)
Glucose-Capillary: 184 mg/dL — ABNORMAL HIGH (ref 70–99)
Glucose-Capillary: 196 mg/dL — ABNORMAL HIGH (ref 70–99)
Glucose-Capillary: 245 mg/dL — ABNORMAL HIGH (ref 70–99)
Glucose-Capillary: 268 mg/dL — ABNORMAL HIGH (ref 70–99)
Glucose-Capillary: 288 mg/dL — ABNORMAL HIGH (ref 70–99)
Glucose-Capillary: 351 mg/dL — ABNORMAL HIGH (ref 70–99)
Glucose-Capillary: 380 mg/dL — ABNORMAL HIGH (ref 70–99)
Glucose-Capillary: 463 mg/dL — ABNORMAL HIGH (ref 70–99)
Glucose-Capillary: 469 mg/dL — ABNORMAL HIGH (ref 70–99)

## 2022-04-22 LAB — GASTROINTESTINAL PANEL BY PCR, STOOL (REPLACES STOOL CULTURE)

## 2022-04-22 LAB — PHOSPHORUS
Phosphorus: 2 mg/dL — ABNORMAL LOW (ref 2.5–4.6)
Phosphorus: 7.7 mg/dL — ABNORMAL HIGH (ref 2.5–4.6)

## 2022-04-22 LAB — HEPATIC FUNCTION PANEL
ALT: 17 U/L (ref 0–44)
AST: 18 U/L (ref 15–41)
Albumin: 3.8 g/dL (ref 3.5–5.0)
Alkaline Phosphatase: 175 U/L — ABNORMAL HIGH (ref 38–126)
Bilirubin, Direct: <0.1 mg/dL (ref 0.0–0.2)
Total Bilirubin: 2.9 mg/dL — ABNORMAL HIGH (ref 0.3–1.2)
Total Protein: 7.7 g/dL (ref 6.5–8.1)

## 2022-04-22 LAB — BLOOD GAS, VENOUS
Acid-base deficit: 18.5 mmol/L — ABNORMAL HIGH (ref 0.0–2.0)
Bicarbonate: 7.4 mmol/L — ABNORMAL LOW (ref 20.0–28.0)
O2 Saturation: 87.2 %
Patient temperature: 37
pCO2, Ven: 19 mmHg — CL (ref 44–60)
pH, Ven: 7.2 — ABNORMAL LOW (ref 7.25–7.43)
pO2, Ven: 56 mmHg — ABNORMAL HIGH (ref 32–45)

## 2022-04-22 LAB — C DIFFICILE QUICK SCREEN W PCR REFLEX
C Diff antigen: NEGATIVE
C Diff interpretation: NOT DETECTED
C Diff toxin: NEGATIVE

## 2022-04-22 LAB — PROCALCITONIN: Procalcitonin: <0.1 ng/mL

## 2022-04-22 LAB — BETA-HYDROXYBUTYRIC ACID
Beta-Hydroxybutyric Acid: 3.74 mmol/L — ABNORMAL HIGH (ref 0.05–0.27)
Beta-Hydroxybutyric Acid: 0.09 mmol/L (ref 0.05–0.27)

## 2022-04-22 LAB — MAGNESIUM
Magnesium: 1.7 mg/dL (ref 1.7–2.4)
Magnesium: 2.3 mg/dL (ref 1.7–2.4)

## 2022-04-22 LAB — CBG MONITORING, ED: Glucose-Capillary: 471 mg/dL — ABNORMAL HIGH (ref 70–99)

## 2022-04-22 LAB — LACTIC ACID, PLASMA: Lactic Acid, Venous: 1.9 mmol/L (ref 0.5–1.9)

## 2022-04-22 MED ORDER — POTASSIUM PHOSPHATES 15 MMOLE/5ML IV SOLN
15.0000 mmol | Freq: Once | INTRAVENOUS | Status: AC
  Administered 2022-04-22: 15 mmol via INTRAVENOUS
  Filled 2022-04-22: qty 5

## 2022-04-22 MED ORDER — ENOXAPARIN SODIUM 40 MG/0.4ML IJ SOSY
40.0000 mg | PREFILLED_SYRINGE | Freq: Two times a day (BID) | INTRAMUSCULAR | Status: DC
  Administered 2022-04-22 (×2): 40 mg via SUBCUTANEOUS
  Filled 2022-04-22 (×2): qty 0.4

## 2022-04-22 MED ORDER — HYDROCERIN EX CREA
TOPICAL_CREAM | Freq: Every day | CUTANEOUS | Status: DC
  Filled 2022-04-22 (×2): qty 113

## 2022-04-22 MED ORDER — CALCIUM GLUCONATE-NACL 1-0.675 GM/50ML-% IV SOLN
1.0000 g | Freq: Once | INTRAVENOUS | Status: AC
  Administered 2022-04-22: 1000 mg via INTRAVENOUS
  Filled 2022-04-22: qty 50

## 2022-04-22 MED ORDER — IPRATROPIUM-ALBUTEROL 0.5-2.5 (3) MG/3ML IN SOLN: 3.0000 mL | RESPIRATORY_TRACT | Status: DC | PRN

## 2022-04-22 MED ORDER — POTASSIUM CHLORIDE 10 MEQ/100ML IV SOLN: 10.0000 meq | INTRAVENOUS | Status: DC

## 2022-04-22 MED ORDER — INSULIN ASPART 100 UNIT/ML IJ SOLN
0.0000 [IU] | INTRAMUSCULAR | Status: DC
  Administered 2022-04-22 (×3): 2 [IU] via SUBCUTANEOUS
  Administered 2022-04-23: 1 [IU] via SUBCUTANEOUS
  Administered 2022-04-23: 2 [IU] via SUBCUTANEOUS
  Administered 2022-04-23 (×2): 1 [IU] via SUBCUTANEOUS
  Filled 2022-04-22 (×7): qty 1

## 2022-04-22 MED ORDER — INSULIN GLARGINE-YFGN 100 UNIT/ML ~~LOC~~ SOLN
15.0000 [IU] | Freq: Every day | SUBCUTANEOUS | Status: DC
  Administered 2022-04-22 – 2022-04-23 (×2): 15 [IU] via SUBCUTANEOUS
  Filled 2022-04-22 (×2): qty 0.15

## 2022-04-22 MED ORDER — POTASSIUM CHLORIDE 10 MEQ/100ML IV SOLN
10.0000 meq | INTRAVENOUS | Status: AC
  Administered 2022-04-22 (×4): 10 meq via INTRAVENOUS
  Filled 2022-04-22 (×4): qty 100

## 2022-04-22 MED ORDER — MAGNESIUM SULFATE 2 GM/50ML IV SOLN
2.0000 g | Freq: Once | INTRAVENOUS | Status: AC
  Administered 2022-04-22: 2 g via INTRAVENOUS
  Filled 2022-04-22: qty 50

## 2022-04-22 MED ORDER — POTASSIUM PHOSPHATES 15 MMOLE/5ML IV SOLN: 15.0000 mmol | Freq: Once | INTRAVENOUS | Status: DC

## 2022-04-22 MED ORDER — TIOTROPIUM BROMIDE MONOHYDRATE 18 MCG IN CAPS
1.0000 | ORAL_CAPSULE | Freq: Every day | RESPIRATORY_TRACT | Status: DC
  Administered 2022-04-23 – 2022-04-28 (×4): 18 ug via RESPIRATORY_TRACT
  Filled 2022-04-22 (×2): qty 5

## 2022-04-22 MED ORDER — POTASSIUM CHLORIDE 10 MEQ/100ML IV SOLN
10.0000 meq | INTRAVENOUS | Status: AC
  Administered 2022-04-22 (×3): 10 meq via INTRAVENOUS
  Filled 2022-04-22 (×4): qty 100

## 2022-04-22 MED ORDER — PIPERACILLIN-TAZOBACTAM 3.375 G IVPB: 3.3750 g | Freq: Three times a day (TID) | INTRAVENOUS | Status: DC

## 2022-04-22 MED ORDER — CHLORHEXIDINE GLUCONATE CLOTH 2 % EX PADS
6.0000 | MEDICATED_PAD | Freq: Every day | CUTANEOUS | Status: DC
  Administered 2022-04-22 – 2022-04-23 (×2): 6 via TOPICAL

## 2022-04-22 MED ORDER — SODIUM CHLORIDE 0.9 % IV SOLN
2.0000 g | INTRAVENOUS | Status: AC
  Administered 2022-04-22 – 2022-04-26 (×4): 2 g via INTRAVENOUS
  Filled 2022-04-22: qty 20
  Filled 2022-04-22: qty 2
  Filled 2022-04-22 (×2): qty 20
  Filled 2022-04-22: qty 2
  Filled 2022-04-22: qty 20

## 2022-04-22 NOTE — Progress Notes (Signed)
ANTICOAGULATION CONSULT NOTE - Initial Consult  Pharmacy Consult for Lovenox Indication: VTE prophylaxis (hx of DVT, on Eliquis PTA)  No Known Allergies  Patient Measurements: Height: 5\' 6"  (167.6 cm) Weight: 56.2 kg (123 lb 14.4 oz) IBW/kg (Calculated) : 59.3 Heparin Dosing Weight:   Vital Signs: Temp: 96.9 F (36.1 C) (03/28 0033) Temp Source: Rectal (03/28 0033) BP: 131/83 (03/28 0015) Pulse Rate: 96 (03/28 0015)  Labs: Recent Labs    04/21/22 2038  HGB 12.5  HCT 44.5  PLT 446*  CREATININE 1.54*    Estimated Creatinine Clearance: 37.9 mL/min (A) (by C-G formula based on SCr of 1.54 mg/dL (H)).   Medical History: Past Medical History:  Diagnosis Date   Coronary artery disease    Diabetes mellitus without complication (HCC)    Diabetes mellitus, type 2 (HCC)    HFrEF (heart failure with reduced ejection fraction) (HCC)    Hyperlipemia    Hypertension    Ischemic cardiomyopathy    Orthostatic hypotension    PAD (peripheral artery disease) (HCC)    Tobacco use     Medications:  (Not in a hospital admission)   Assessment: Pharmacy consulted to dose lovenox in this 52 year old female admitted with DKA,  on Eliquis 5 mg PO BID PTA for VTE prophylaxis but is currently NPO.   Uncertain of last dose.  NP anticipates pt will be able to resume PO intake with in 1 - 2 days. Pt received lovenox 40 mg SQ on 3/27 @ 2347.  CrCl = 37.9 ml/min   Goal of Therapy:  Prevention of DVT     Plan:  Lovenox 40 mg SQ Q12H to start on 3/28 @ 1200.   Beth Carroll 04/22/2022,12:42 AM

## 2022-04-22 NOTE — Lactation Note (Signed)
  April 22, 2022  Patient: Beth Carroll  Date of Birth: August 17, 1970  Date of Visit: 04/21/2022    To Whom It May Concern:  Aishia Devico was seen and treated in our ICU on 04/21/2022.  Sarajane Jews, son, has been at bedside.  He may return to work 04/23/2022.   Sincerely,  Dr. Mortimer Fries, ICU MD

## 2022-04-22 NOTE — H&P (Signed)
NAME:  Beth Carroll, MRN:  [id number], DOB:  1970-02-22, LOS: 1 ADMISSION DATE:  04/21/2022, CONSULTATION DATE:  04/22/22 REFERRING MD: Dr. Archie Balboa, CHIEF COMPLAINT: Weakness  History of Present Illness:  52 year old female presenting to [facility] ED from home via EMS on 04/21/2022 for evaluation of weakness.  History provided per chart review and bedside interview with patient. Patient reports being in her normal state of health until Tuesday (3/26) when she began having " continuous vomiting" and multiple episodes of diarrhea.  Patient denied hematemesis, melena/hematochezia.  She endorsed poor p.o. intake over the last 2 days, generalized weakness and fatigue, polydipsia as well as a mild headache.  She denied blurred vision, LOC, shortness of breath, productive cough, chest pain, fever/chills and/or urinary symptoms.  She reports not taking her antihypertensive medication as prescribed after being told to hold it by her physician due to marginal blood pressures outpatient.  She reports taking 30 units of long-acting insulin daily and 8 units of short acting insulin before each meal.  She has not taken any insulin in the last 2 days due to the nausea vomiting and diarrhea.  The patient was a poor historian and was vague about what her blood glucose runs at home, stating it is "normal".  She denies running out of any medication, and has been taking her Eliquis and Plavix consistently.  She is a former smoker quitting about a year ago, she denies EtOH/recreational drug use.  She is currently receiving at home physical therapy in order to ambulate with a walker but is otherwise wheelchair-bound.  ED course: Upon arrival patient met SIRS criteria as she was somnolent, hypothermic, tachypneic and hypotensive.  Lab work consistent with DKA, pseudohyponatremia, AKI and AGMA, transaminitis, lactic acidosis, leukocytosis with left shift & possible UTI.  Radiology reports opacities on chest x-ray concerning for  pneumonia, CT abdomen and pelvis without contrast pending.  Patient received sepsis protocol including empiric antibiotics and IV fluid resuscitation as well as DKA protocol initiation with insulin drip, potassium supplementation & IV fluid. Initial Vitals: 95.1, 22, 86, 86/54 and 96% on room air Significant labs: (Labs/ Imaging personally reviewed) I, Domingo Pulse Rust-Chester, AGACNP-BC, personally viewed and interpreted this ECG. EKG Interpretation: Date: 04/21/2022, EKG Time: 20:46, Rate: 86, Rhythm: NSR, QRS Axis: LAD, Intervals: Possible prolonged QTc but challenging read due to artifact, ST/T Wave abnormalities: None noted but challenging read due to artifact,  Narrative Interpretation: NSR with artifact noted Chemistry: Na+: 130, K+: 4.7, BUN/Cr.:  65/1.54, Serum CO2/ AG: <7/not calculated, alk phos: 175, T. Bili: 2.9 Hematology: WBC: 23, Hgb: 12.5,  Lactic/ PCT: 2.5 > 2.4/pending UA: + Ketones, + leuks, + proteinuria, + Hgb, > 50 RBCs  VBG: 08/15/50/5.2 CXR 04/21/2022: Heterogeneous opacities in the left mid and lower lung suspicious for pneumonia.  CT abdomen and pelvis without contrast 04/22/2022: Pending  PCCM consulted for admission due to severe DKA and metabolic acidosis in the setting of suspected sepsis and hypotension with potential need for vasopressor support.  Pertinent  Medical History  CAD s/p PCI to LAD and ramus (11/2020) PAD s/p angioplasty of the left SFA and popliteal arteries (11/2020) Hypertension Hyperlipidemia Type 2 diabetes mellitus Former smoker (20+ pack history, quit 2022) ICM  HFrEF  Significant Hospital Events: Including procedures, antibiotic start and stop dates in addition to other pertinent events   04/22/2022: Admit to ICU with severe DKA and metabolic acidosis in the setting of suspected sepsis secondary to suspected UTI versus intra-abdominal infection with hypotension and potential  need for vasopressor support  Interim History / Subjective:   Patient lethargic but responsive, plan of care discussed all questions and concerns answered at this time.  Objective   Blood pressure 113/69, pulse 97, temperature (!) 94.7 F (34.8 C), temperature source Rectal, resp. rate (!) 23, height 5\' 6"  (1.676 m), weight 56.2 kg, last menstrual period 01/26/2015, SpO2 100 %.       No intake or output data in the 24 hours ending 04/22/22 0012 Filed Weights   04/21/22 2022 04/21/22 2059  Weight: 56.7 kg 56.2 kg    Examination: General: Adult female, critically ill-frail appearing with dried stool and emesis noted, lying in bed, NAD HEENT: MM pale/dry, anicteric, atraumatic, neck supple Neuro: A&O x 4-lethargic, able to follow commands, PERRL +3, MAE-generalized weakness CV: s1s2 RRR, NSR on monitor, no r/m/g Pulm: Regular, non labored on room air, breath sounds clear-BUL & clear/diminished-BLL GI: soft, flat, non tender, bs x 4 GU: foley in place with clear yellow urine Skin: chronic sacral and bilateral heel wounds noted     Extremities: warm/dry, pulses + 2 R/P, trace edema noted BLE  Resolved Hospital Problem list     Assessment & Plan:  Diabetic ketoacidosis  Severe AGMA PMHx: poorly controlled T2DM - DKA protocol initiated - 100 meq sodium bicarbonate supplementation - Aggressive IV hydration, when blood glucose falls below 250 add D5 to IV fluids - Insulin drip ordered per Endo tool protocol - Q 4 BMP, closely monitor potassium, replace electrolytes PRN - monitor blood glucose every 1 hour, per Endo tool protocol - trend serum CO2/ AG/ Lactic, f/u A1C, VBG - Diabetes coordinator consult - Resume home insulin regiment once appropriate  Suspected Sepsis due to suspected UTI/ intra-abdominal infection - present on admission Nausea/ Vomiting/ Diarrhea Initial interventions/workup included: 2 L of NS/LR & Cefepime/ Vancomycin - STAT CT abdomen/pelvis wo contrast - GI panel PCR & C-diff - Supplemental oxygen as needed, to  maintain SpO2 > 90% - f/u cultures, trend lactic/ PCT - Daily CBC, monitor WBC/ fever curve - IV antibiotics: ceftriaxone 2 g - IVF hydration as needed: additional 1 L LR bolus - Consider vasopressors to maintain MAP< 65: norepinephrine 1st line  Acute Kidney Injury suspect pre-renal secondary to hypovolemia in the setting of DKA, vomiting/diarrhea Baseline Cr: 0.47, Cr on admission:1.54 - Strict I/O's: alert provider if UOP < 0.5 mL/kg/hr - Avoid nephrotoxic agents as able, ensure adequate renal perfusion - consider renal US, if renal fxn does not improve with aggressive resuscitation  Transaminitis  PMHx: HLD - Trend hepatic function - f/u CT abdomen/pelvis - avoid hepatotoxic agents: hold outpatient Lipitor until LFT's normalize  PAD PMHx: Chronic ICM, HFrEF, CAD s/p PCI  - patient unable to tolerate PO medications > consult Rx for systemic Lovenox, restart Eliquis once she is able to tolerate PO - continue plavix as tolerated  COPD PMHx: former smoker Recent PFT's show severe obstruction but question regarding effort applied - continue outpatient Spiriva, bronchodilators PRN - supplemental O2 PRN to maintain SpO2 > 90%  Chronic Wounds- sacrum & bilateral heels - consult WOC - utilize offloading devices, Turn Q 2 - optimize nutrition once stabilized  Best Practice (right click and "Reselect all SmartList Selections" daily)  Diet/type: NPO DVT prophylaxis: systemic dose LMWH GI prophylaxis: PPI Lines: N/A Foley:  Yes, and it is still needed Code Status:  full code Last date of multidisciplinary goals of care discussion [04/22/22]  Labs   CBC: Recent Labs  Lab 04/21/22 2038  WBC 23.0*  NEUTROABS 20.8*  HGB 12.5  HCT 44.5  MCV 94.1  PLT 446*    Basic Metabolic Panel: Recent Labs  Lab 04/21/22 2038  NA 130*  K 4.7  CL 90*  CO2 <7*  GLUCOSE 733*  BUN 65*  CREATININE 1.54*  CALCIUM 9.5   GFR: Estimated Creatinine Clearance: 37.9 mL/min (A) (by C-G  formula based on SCr of 1.54 mg/dL (H)). Recent Labs  Lab 04/21/22 2038 04/21/22 2248  WBC 23.0*  --   LATICACIDVEN 2.5* 2.4*    Liver Function Tests: No results for input(s): "AST", "ALT", "ALKPHOS", "BILITOT", "PROT", "ALBUMIN" in the last 168 hours. No results for input(s): "LIPASE", "AMYLASE" in the last 168 hours. No results for input(s): "AMMONIA" in the last 168 hours.  ABG    Component Value Date/Time   PHART 7.12 (LL) 12/10/2021 0030   PCO2ART <18 (LL) 12/10/2021 0030   PO2ART 123 (H) 12/10/2021 0030   HCO3 5.2 (L) 04/21/2022 2038   ACIDBASEDEF 24.7 (H) 04/21/2022 2038   O2SAT 74.6 04/21/2022 2038     Coagulation Profile: No results for input(s): "INR", "PROTIME" in the last 168 hours.  Cardiac Enzymes: No results for input(s): "CKTOTAL", "CKMB", "CKMBINDEX", "TROPONINI" in the last 168 hours.  HbA1C: Hgb A1c MFr Bld  Date/Time Value Ref Range Status  12/10/2021 01:58 AM 8.1 (H) 4.8 - 5.6 % Final    Comment:    (NOTE) Pre diabetes:          5.7%-6.4%  Diabetes:              >6.4%  Glycemic control for   <7.0% adults with diabetes   08/22/2021 08:11 AM 12.9 (H) 4.8 - 5.6 % Final    Comment:    (NOTE) Pre diabetes:          5.7%-6.4%  Diabetes:              >6.4%  Glycemic control for   <7.0% adults with diabetes     CBG: Recent Labs  Lab 04/21/22 2044 04/21/22 2317  GLUCAP >600* 559*    Review of Systems: Positives in BOLD  Gen: Denies fever, chills, weight change, fatigue, night sweats HEENT: Denies blurred vision, double vision, hearing loss, tinnitus, sinus congestion, rhinorrhea, sore throat, neck stiffness, dysphagia PULM: Denies shortness of breath, cough, sputum production, hemoptysis, wheezing CV: Denies chest pain, edema, orthopnea, paroxysmal nocturnal dyspnea, palpitations GI: Denies abdominal pain, nausea, vomiting, diarrhea, hematochezia, melena, constipation, change in bowel habits GU: Denies dysuria, hematuria, polyuria,  oliguria, urethral discharge Endocrine: Denies hot or cold intolerance, polyuria, polyphagia or appetite change Derm: Denies rash, dry skin, scaling or peeling skin change Heme: Denies easy bruising, bleeding, bleeding gums Neuro: Denies headache, numbness, weakness, slurred speech, loss of memory or consciousness  Past Medical History:  She,  has a past medical history of Coronary artery disease, Diabetes mellitus without complication ([place]), Diabetes mellitus, type 2 ([place]), HFrEF (heart failure with reduced ejection fraction) ([place]), Hyperlipemia, Hypertension, Ischemic cardiomyopathy, Orthostatic hypotension, PAD (peripheral artery disease) ([place]), and Tobacco use.   Surgical History:   Past Surgical History:  Procedure Laterality Date   CORONARY STENT INTERVENTION N/A 12/15/2020   Procedure: CORONARY STENT INTERVENTION;  Surgeon: Wellington Hampshire, MD;  Location: [facility];  Service: Cardiovascular;  Laterality: N/A;   LEFT HEART CATH AND CORONARY ANGIOGRAPHY N/A 12/15/2020   Procedure: LEFT HEART CATH AND CORONARY ANGIOGRAPHY;  Surgeon: Wellington Hampshire, MD;  Location: [facility];  Service: Cardiovascular;  Laterality: N/A;   LOWER EXTREMITY ANGIOGRAPHY Left 12/10/2020   Procedure: Lower Extremity Angiography;  Surgeon: Algernon Huxley, MD;  Location: [facility];  Service: Cardiovascular;  Laterality: Left;   LOWER EXTREMITY ANGIOGRAPHY Right 12/25/2020   Procedure: LOWER EXTREMITY ANGIOGRAPHY;  Surgeon: Algernon Huxley, MD;  Location: [facility] [facility];  Service: Cardiovascular;  Laterality: Right;   LOWER EXTREMITY ANGIOGRAPHY Right 08/19/2021   Procedure: Lower Extremity Angiography;  Surgeon: Algernon Huxley, MD;  Location: [facility];  Service: Cardiovascular;  Laterality: Right;   LOWER EXTREMITY ANGIOGRAPHY Right 08/19/2021   Procedure: Lower Extremity Angiography;  Surgeon: Algernon Huxley, MD;  Location: [facility];  Service: Cardiovascular;   Laterality: Right;   ORIF FEMUR FRACTURE Right 12/14/2021   Procedure: OPEN REDUCTION INTERNAL FIXATION RIGHT DISTAL FEMUR;  Surgeon: Shona Needles, MD;  Location: [facility];  Service: Orthopedics;  Laterality: Right;     Social History:   reports that she quit smoking about 16 months ago. Her smoking use included cigarettes. She has never used smokeless tobacco. She reports that she does not drink alcohol.   Family History:  Her family history includes Hypertension in her father.   Allergies No Known Allergies   Home Medications  Prior to Admission medications   Medication Sig Start Date End Date Taking? Authorizing Provider  acetaminophen (TYLENOL) 650 MG CR tablet Take 650 mg by mouth every 8 (eight) hours as needed for pain.    [provider]  albuterol (VENTOLIN HFA) 108 (90 Base) MCG/ACT inhaler Inhale 2 puffs into the lungs every 6 (six) hours as needed for wheezing or shortness of breath.    [provider]  aspirin EC 81 MG tablet Take 81 mg by mouth daily. Swallow whole.    [provider]  atorvastatin (LIPITOR) 20 MG tablet Take 1 tablet (20 mg total) by mouth daily. 12/17/20   Elgergawy, Silver Huguenin, MD  barrier cream (NON-SPECIFIED) CREA Apply 1 Application topically 2 (two) times daily as needed. 03/22/22   Sidney Ace, MD  dapagliflozin propanediol (FARXIGA) 10 MG TABS tablet TAKE 1 TABLET BY MOUTH EVERY DAY BEFORE BREAKFAST 04/02/22   Furth, Cadence H, PA-C  ELIQUIS 5 MG TABS tablet TAKE 1 TABLET BY MOUTH TWICE A DAY 12/09/21   Kris Hartmann, NP  feeding supplement (ENSURE ENLIVE / ENSURE PLUS) LIQD Take 237 mLs by mouth 3 (three) times daily between meals. 12/18/21   Debbe Odea, MD  insulin glargine (LANTUS SOLOSTAR) 100 UNIT/ML Solostar Pen Inject 25 Units into the skin daily.    [provider]  Multiple Vitamins-Minerals (CENTRUM SILVER 50+WOMEN PO) Take 1 tablet by mouth daily.    [provider]  nutrition  supplement, JUVEN, (JUVEN) PACK Take 1 packet by mouth 2 (two) times daily between meals. 12/18/21   Debbe Odea, MD  pantoprazole (PROTONIX) 40 MG tablet Take 1 tablet (40 mg total) by mouth daily. 12/17/20   Elgergawy, Silver Huguenin, MD  Semaglutide,0.25 or 0.5MG /DOS, 2 MG/3ML SOPN Inject 0.5 mg into the skin 3 (three) times daily. Before each meal 10/27/21   [provider]  tiotropium (SPIRIVA) 18 MCG inhalation capsule Place 1 capsule into inhaler and inhale daily. 09/24/21   [provider]  traMADol (ULTRAM) 50 MG tablet Take 50 mg by mouth every 6 (six) hours as needed for severe pain or moderate pain. 03/19/22   [provider]  Vitamin D,  Ergocalciferol, (DRISDOL) 1.25 MG (50000 UNIT) CAPS capsule Take 50,000 Units by mouth once a week. 10/28/20   [provider]     Critical care time: 65 minutes       Venetia Night, AGACNP-BC Acute Care Nurse Practitioner [facility]   [phone] / [phone] Please see Amion for pager details.

## 2022-04-22 NOTE — Progress Notes (Signed)
When asking the patient if she knows when she has to use the restroom, she states that she does. Purewick is in place and there is urine in the canister. I informed the patient that I needed to collect a stool sample. She stated that she has already gone a little while ago. I clarified if it was from when she arrived and was cleaned up or again.  She has gone again since arrival. Explained that she needed to make [place] aware since she already had breakdown. She then asked me to take off the boots that we placed to help with the pressure areas on her bilateral heels. Explained what they were for, and she still stated that they bothered her. Told her that I would ask the provider.   0230: Changed out boots to foam heel protectors per providers recommendations. Stool specimen collected and sent.

## 2022-04-22 NOTE — Progress Notes (Signed)
Pharmacy Antibiotic Note  Beth Carroll is a 52 y.o. female admitted on 04/21/2022 with  intra-abdominal .  Pharmacy has been consulted for Zosyn dosing.  Plan: Zosyn 3.375g IV q8h (4 hour infusion).  Height: 5\' 6"  (167.6 cm) Weight: 56.2 kg (123 lb 14.4 oz) IBW/kg (Calculated) : 59.3  Temp (24hrs), Avg:94.9 F (34.9 C), Min:94.7 F (34.8 C), Max:95.1 F (35.1 C)  Recent Labs  Lab 04/21/22 2038 04/21/22 2248  WBC 23.0*  --   CREATININE 1.54*  --   LATICACIDVEN 2.5* 2.4*    Estimated Creatinine Clearance: 37.9 mL/min (A) (by C-G formula based on SCr of 1.54 mg/dL (H)).    No Known Allergies  Antimicrobials this admission:   >>    >>   Dose adjustments this admission:   Microbiology results:  BCx:   UCx:    Sputum:    MRSA PCR:   Thank you for allowing pharmacy to be a part of this patient's care.  Koa Palla D 04/22/2022 12:20 AM

## 2022-04-22 NOTE — Inpatient Diabetes Management (Addendum)
Inpatient Diabetes Program Recommendations  AACE/ADA: New Consensus Statement on Inpatient Glycemic Control   Target Ranges:  Prepandial:   less than 140 mg/dL      Peak postprandial:   less than 180 mg/dL (1-2 hours)      Critically ill patients:  140 - 180 mg/dL    Latest Reference Range & Units 04/22/22 03:02 04/22/22 04:08 04/22/22 05:05 04/22/22 05:56 04/22/22 07:07 04/22/22 08:02 04/22/22 09:11  Glucose-Capillary 70 - 99 mg/dL 351 (H) 288 (H) 268 (H) 245 (H) 196 (H) 165 (H) 171 (H)    Latest Reference Range & Units 04/21/22 20:38 04/22/22 04:48  Beta-Hydroxybutyric Acid 0.05 - 0.27 mmol/L >8.00 (H) 3.74 (H)    Latest Reference Range & Units 04/21/22 20:38  CO2 22 - 32 mmol/L <7 (L)  Glucose 70 - 99 mg/dL 733 (HH)  BUN 6 - 20 mg/dL 65 (H)  Creatinine 0.44 - 1.00 mg/dL 1.54 (H)  Calcium 8.9 - 10.3 mg/dL 9.5  Anion gap 5 - 15  NOT CALCULATED   Review of Glycemic Control  Diabetes history: DM2 Outpatient Diabetes medications: Lantus 30 units QHS, Novolog 8 units TID with meals, Farxiga 10 mg daily (not taking), Ozempic 0.5 mg Qweek (not taking) Current orders for Inpatient glycemic control: IV insulin  Inpatient Diabetes Program Recommendations:    Insulin: Once provider is ready to transition from IV to SQ insulin, please consider ordering Semglee 15 units Q24H (based on 49.8 kg x 0.3 units), CBGs Q4H, Novolog 0-9 units Q4H, and Novolog 3 units TID with meals for meal coverage if patient eats at least 50% of meals.  Labs: May want to consider ordering c-peptide and GAD antibodies to help determine if patient has Type 1 DM.   Outpatient DM: At time of discharge please provide Rx for Lantus Solostar pens [phone]), Novolog Flexpens [phone]), insulin pen needles [phone]), and FreeStyle Libre3 sensors [phone]). Asked TOC to see if medications can be filled and delivered to patient prior to discharge.  NOTE: Patient admitted with DKA, suspected sepsis due to UTI/intra-abdominal  infection, acute kidney injury, transaminitis, PAD, COPD, and chronic wounds (sacrum and bilateral heels. Per note on 04/22/22 by B. Rust-Chester, NP, patient reported "She has not taken any insulin in the last 2 days due to the nausea vomiting and diarrhea."  Initial glucose 733 gm/dl, CO2 <7, AG not calculated, and beta-hydroxybutyric acid >8.00 on 04/21/22. Patient was started on IV insulin per EndoTool and most current CBG 171 mg/dl at 9:11 am.   Addendum 04/22/22@11 :00-Spoke to patient, son, and boyfriend at bedside. Patient reports that she has DM2 and is taking Lantus 30 units QHS and Novolog 8 units TID with meals for DM as an outpatient. She reports that she uses FreeStyle Libre3 for glucose monitoring; currently has sensor on back of right arm.  Patient states that she has recently gotten Medicaid and she is almost out of Lantus and Novolog and she does not have anymore FreeStyle Libre3 CGM sensors. She states that her pharmacy keeps telling her that they are waiting for insurance to approve the Lantus, Novolog, and FreeStyle Libre3 sensors. Patient confirms that over the past 2 days she has not taken insulin due to N/V. Patient also notes that she has not even checked glucose on Libre3 since being sick.  Patient reports that she no longer takes [place] or Ozmepic. Patient states that she has been in DKA in the past with similar situation of having N/V and not taking insulin. Discussed checking labs to  confirm if she has DM1 or DM2. Discussed c-peptide and GAD antibodies and encouraged patient to discuss with PCP if not ordered at the hospital.  Discussed DM1 and DM2 and explained that if a person with DM1 goes without taking insulin they will develop DKA.  Encouraged patient to talk with PCP about how much insulin to take even when she is sick and not able to eat to prevent reoccurrence of DKA.   Patient states that when she takes insulin as prescribed that glucose is usually in the 100's-200's mg/dl.  Encouraged patient to talk with PCP about being referred to Endocrinology to assist with DM management. Discussed SQ insulin regimen ordered currently. Patient still has not eaten anything and not sure if she will be able to eat. Informed patient that our team will follow along and will ask TOC about possibly having medications delivered to her before discharge if possible to ensure she gets refills for Lantus, Novolog, and FreeStyle Libre3 sensors. Patient verbalized understanding of information and has no questions at this time. Sent chat message at 13:53 to Evette Cristal, LCSW with TOC to inquire about getting medications filled and delivered to patient prior to discharge.   Thanks, Barnie Alderman, RN, MSN, [place] Diabetes Coordinator Inpatient Diabetes Program [phone] (Team Pager from 8am to [place])

## 2022-04-22 NOTE — Progress Notes (Signed)
PHARMACY CONSULT NOTE - FOLLOW UP  Pharmacy Consult for Electrolyte Monitoring and Replacement   Recent Labs: Potassium (mmol/L)  Date Value  04/22/2022 3.1 (L)   Magnesium (mg/dL)  Date Value  04/22/2022 1.7   Calcium (mg/dL)  Date Value  04/22/2022 9.2   Albumin (g/dL)  Date Value  04/21/2022 3.8   Phosphorus (mg/dL)  Date Value  04/22/2022 2.0 (L)   Sodium (mmol/L)  Date Value  04/22/2022 138  03/18/2021 137     Assessment: 3/27 @ 2038:  K = 4.7,  Ca = 9.5 3/28 @ 0126:  K = 3.7,  Ca = 8.5,Alb = 3.8,Corrected Ca= 8.7 3/28 @ 0448:  K = 3.1,  Ca = 9.2, Phos = 2.0  Goal of Therapy:  Electrolytes WNL    Plan: 3/28:  K @ 0448 = 3.1,  this is following KCl 10 mEq X 2 started @ ~ 0300.  - Will order additional KCl 10 mEq IV X 4 to start @ 0700. - Will order KPhos 15 mmol IV X 1.  - Will check electrolytes Q4H.   Orene Desanctis ,PharmD Clinical Pharmacist 04/22/2022 5:40 AM

## 2022-04-22 NOTE — Consult Note (Signed)
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 Irritant contact dermatitis to the perianal area from recent fecal incontinence Wound type:Pressure, ICD  ICD-10 CM Codes for Irritant Dermatitis  L24A2 - Due to fecal, urinary or dual incontinence L24A9 - Due to friction or contact with other specified body fluids  Pressure Injury POA: Yes Measurement:Bedside RN to measure bilateral heel wounds with next application of emollient today and prior to placement of feet into Prevalon boots and document measurements on Nursing Flow Sheet. Wound bed:See photodocumentation provided by EDP Drainage (amount, consistency, odor) small serous from upper buttock lesions Periwound: purple discoloration but bilateral buttocks is not consistent with DTPI, but more consistent with chronic tissue trauma from prolonged sitting. Dressing procedure/placement/frequency: Patient is in ICU with a mattress replacement with low air loss feature. Turning and repositioning is in place. Bilateral Prevalon boots for heels are in place.I will add Eucerin cream application to bilateral LEs once daily. Silicone foam dressings to the buttock lesions is appropriate and have been ordered.Timely incontinence care with house skin care products (cleanser, moisture barrier ointment) is indicated.  [place] nursing team will not follow, but will remain available to this patient, the nursing and medical teams.  Please re-consult if needed.  Thank you for inviting [place] to participate in this patient's Plan of Care.  Maudie Flakes, MSN, RN, CNS, [place], Serita Grammes, [employer], [employer] phone:  [phone]

## 2022-04-22 NOTE — Progress Notes (Signed)
Beth Carroll was seen and treated in our emergency department on 01/05/2022. She may return to work on @EDEXCUSEDATE @. @EDEXCUSECOMMENT @   @EDEXCUSEPROV @

## 2022-04-22 NOTE — Progress Notes (Signed)
eLink Physician-Brief Progress Note Patient Name: Beth Carroll DOB: [birth year] MRN: [id number]   Date of Service  04/22/2022  HPI/Events of Note  Patient admitted with DKA, SIRS, r/o pneumonia, r/o UTI.  eICU Interventions  New Patient Evaluation.        Kerry Kass Himmat Enberg 04/22/2022, 1:52 AM

## 2022-04-22 NOTE — Progress Notes (Signed)
PHARMACY CONSULT NOTE - FOLLOW UP  Pharmacy Consult for Electrolyte Monitoring and Replacement   Recent Labs: Potassium (mmol/L)  Date Value  04/22/2022 3.7   Magnesium (mg/dL)  Date Value  04/21/2022 2.3   Calcium (mg/dL)  Date Value  04/22/2022 8.5 (L)   Albumin (g/dL)  Date Value  04/21/2022 3.8   Phosphorus (mg/dL)  Date Value  04/21/2022 7.7 (H)   Sodium (mmol/L)  Date Value  04/22/2022 135  03/18/2021 137     Assessment: 3/27 @ 2038:  K = 4.7,  Ca = 9.5 3/28 @ 0126:  K = 3.7,  Ca = 8.5,Alb = 3.8,Corrected Ca= 8.7  Goal of Therapy:  Electrolytes WNL    Plan: Based on current trend, will order KCl 10 mEq IV X 4 and Calcium gluconate 1 gm IV X 1. Will check electrolytes Q4H.   Orene Desanctis ,PharmD Clinical Pharmacist 04/22/2022 2:22 AM

## 2022-04-23 ENCOUNTER — Inpatient Hospital Stay: Payer: Medicaid Other

## 2022-04-23 DIAGNOSIS — E1110 Type 2 diabetes mellitus with ketoacidosis without coma: Secondary | ICD-10-CM

## 2022-04-23 LAB — GLUCOSE, CAPILLARY
Glucose-Capillary: 148 mg/dL — ABNORMAL HIGH (ref 70–99)
Glucose-Capillary: 145 mg/dL — ABNORMAL HIGH (ref 70–99)
Glucose-Capillary: 161 mg/dL — ABNORMAL HIGH (ref 70–99)
Glucose-Capillary: 147 mg/dL — ABNORMAL HIGH (ref 70–99)
Glucose-Capillary: 184 mg/dL — ABNORMAL HIGH (ref 70–99)
Glucose-Capillary: 133 mg/dL — ABNORMAL HIGH (ref 70–99)

## 2022-04-23 LAB — BASIC METABOLIC PANEL
Anion gap: 8 (ref 5–15)
BUN: 28 mg/dL — ABNORMAL HIGH (ref 6–20)
CO2: 24 mmol/L (ref 22–32)
Calcium: 9.2 mg/dL (ref 8.9–10.3)
Chloride: 109 mmol/L (ref 98–111)
Creatinine, Ser: 0.44 mg/dL (ref 0.44–1.00)
GFR, Estimated: >60 mL/min (ref >60)
Glucose, Bld: 176 mg/dL — ABNORMAL HIGH (ref 70–99)
Potassium: 3.9 mmol/L (ref 3.5–5.1)
Sodium: 141 mmol/L (ref 135–145)

## 2022-04-23 LAB — PHOSPHORUS: Phosphorus: 1.9 mg/dL — ABNORMAL LOW (ref 2.5–4.6)

## 2022-04-23 LAB — CBC
HCT: 28.8 % — ABNORMAL LOW (ref 36.0–46.0)
Hemoglobin: 9.1 g/dL — ABNORMAL LOW (ref 12.0–15.0)
MCH: 26.7 pg (ref 26.0–34.0)
MCHC: 31.6 g/dL (ref 30.0–36.0)
MCV: 84.5 fL (ref 80.0–100.0)
Platelets: 240 10*3/uL (ref 150–400)
RBC: 3.41 MIL/uL — ABNORMAL LOW (ref 3.87–5.11)
RDW: 16.6 % — ABNORMAL HIGH (ref 11.5–15.5)
WBC: 8.7 10*3/uL (ref 4.0–10.5)
nRBC: 0 % (ref 0.0–0.2)

## 2022-04-23 LAB — MAGNESIUM: Magnesium: 2.2 mg/dL (ref 1.7–2.4)

## 2022-04-23 LAB — LACTIC ACID, PLASMA: Lactic Acid, Venous: 1 mmol/L (ref 0.5–1.9)

## 2022-04-23 MED ORDER — LACTATED RINGERS IV SOLN: INTRAVENOUS | Status: AC

## 2022-04-23 MED ORDER — HEPARIN (PORCINE) 25000 UT/250ML-% IV SOLN
1300.0000 [IU]/h | INTRAVENOUS | Status: DC
  Administered 2022-04-24: 600 [IU]/h via INTRAVENOUS
  Administered 2022-04-26: 1000 [IU]/h via INTRAVENOUS
  Administered 2022-04-26: 1300 [IU]/h via INTRAVENOUS
  Filled 2022-04-23 (×4): qty 250

## 2022-04-23 MED ORDER — AZITHROMYCIN 250 MG PO TABS
500.0000 mg | ORAL_TABLET | Freq: Every day | ORAL | Status: DC
  Administered 2022-04-23: 500 mg via ORAL
  Filled 2022-04-23: qty 2

## 2022-04-23 MED ORDER — APIXABAN 5 MG PO TABS
5.0000 mg | ORAL_TABLET | Freq: Two times a day (BID) | ORAL | Status: DC
  Administered 2022-04-23: 5 mg via ORAL
  Filled 2022-04-23: qty 1

## 2022-04-23 MED ORDER — ASPIRIN 81 MG PO TBEC
81.0000 mg | DELAYED_RELEASE_TABLET | Freq: Every day | ORAL | Status: DC
  Administered 2022-04-23: 81 mg via ORAL
  Filled 2022-04-23: qty 1

## 2022-04-23 MED ORDER — PANTOPRAZOLE SODIUM 40 MG PO TBEC
40.0000 mg | DELAYED_RELEASE_TABLET | Freq: Every day | ORAL | Status: DC
  Administered 2022-04-23 – 2022-04-25 (×3): 40 mg via ORAL
  Filled 2022-04-23 (×3): qty 1

## 2022-04-23 MED ORDER — INSULIN ASPART 100 UNIT/ML IJ SOLN: 0.0000 [IU] | Freq: Three times a day (TID) | INTRAMUSCULAR | Status: DC

## 2022-04-23 MED ORDER — CARVEDILOL 6.25 MG PO TABS
3.1250 mg | ORAL_TABLET | Freq: Two times a day (BID) | ORAL | Status: DC
  Administered 2022-04-23 – 2022-04-24 (×2): 3.125 mg via ORAL
  Filled 2022-04-23 (×2): qty 1

## 2022-04-23 MED ORDER — TRAMADOL HCL 50 MG PO TABS
50.0000 mg | ORAL_TABLET | Freq: Four times a day (QID) | ORAL | Status: DC | PRN
  Administered 2022-04-23 – 2022-04-28 (×10): 50 mg via ORAL
  Filled 2022-04-23 (×10): qty 1

## 2022-04-23 MED ORDER — MORPHINE SULFATE (PF) 2 MG/ML IV SOLN
1.0000 mg | Freq: Once | INTRAVENOUS | Status: AC
  Administered 2022-04-23: 1 mg via INTRAVENOUS
  Filled 2022-04-23: qty 1

## 2022-04-23 MED ORDER — POTASSIUM PHOSPHATES 15 MMOLE/5ML IV SOLN
30.0000 mmol | Freq: Once | INTRAVENOUS | Status: AC
  Administered 2022-04-23: 30 mmol via INTRAVENOUS
  Filled 2022-04-23: qty 10

## 2022-04-23 NOTE — Progress Notes (Signed)
PROGRESS NOTE    Beth Carroll  [id number] DOB: January 21, 1971 DOA: 04/21/2022 PCP: Baxter Hire, MD  [id number]  LOS: 2 days   Brief hospital course:   Assessment & Plan: 52 year old female presenting to [facility] ED from home via EMS on 04/21/2022 for evaluation of weakness.   Tuesday (3/26) when she began having " continuous vomiting" and multiple episodes of diarrhea. She has not taken any insulin in the last 2 days due to the nausea vomiting and diarrhea.  She is currently receiving at home physical therapy in order to ambulate with a walker but is otherwise wheelchair-bound.   Admit to ICU with severe DKA and metabolic acidosis in the setting of suspected sepsis secondary to suspected UTI versus intra-abdominal infection with hypotension and potential need for vasopressor support.  Pt did not need pressor, and was transferred to [facility] on 04/23/22.  Diabetic ketoacidosis  Severe AGMA Insulin non-compliance PMHx: poorly controlled T2DM --off insulin gtt and transitioned to subQ insulin around 11 am on 3/28. --cont glargine 15u daily --ACHS and SSI  Suspected Sepsis  Suspected UTI --leukocytosis, tachypnea.  Started on vanc/cefepime on presentation, and transitioned to ceftriaxone for UTI, however, no urine culture sent.  CXR showed mild Heterogeneous opacities in the left mid and lower lung, however, pt does not have symptoms of PNA. --pt continued to complained of dysuria even after 3 days of IV abx Plan: --cont ceftriaxone for now --send of urine cx  Nausea/ Vomiting/ Diarrhea --CT abdomen/pelvis wo contrast no acute finding. - GI panel PCR & C-diff both neg --cont MIVF@75    Acute Kidney Injury suspect pre-renal secondary to hypovolemia in the setting of DKA, vomiting/diarrhea Baseline Cr: 0.47, Cr on admission:1.54 --Cr back to baseline after IVF   Transaminitis, ruled out --alk phos 175, mildly elevated  Chronic ICM, HFrEF, CAD s/p PCI  --resume home ASA and  coreg   COPD PMHx: former smoker --not in exacerbation --cont Spiriva   Chronic Wounds- sacrum & bilateral heels - consult WOC - utilize offloading devices, Turn Q 2 - optimize nutrition once stabilized  Hypophos --monitor and replete PRN   DVT prophylaxis: [id number] Code Status: Full code  Family Communication: boyfriend updated at bedside today Level of care: Med-Surg Dispo:   The patient is from: home Anticipated d/c is to: home Anticipated d/c date is: 2-3 days   Subjective and Interval History:  Pt reported nausea, not able to eat.  Also complained of chronic dysuria, even after last hospitalization during which UTI was treated.     Objective: Vitals:   04/23/22 0800 04/23/22 0908 04/23/22 1616 04/23/22 1720  BP: (!) 153/79 (!) 150/83 (!) 171/96 (!) 166/91  Pulse: 88 87 92 87  Resp: 18 14 16    Temp: (!) 97.5 F (36.4 C) 98.1 F (36.7 C) 98.1 F (36.7 C)   TempSrc:  Oral    SpO2: 98% 98% 100% 100%  Weight:      Height:        Intake/Output Summary (Last 24 hours) at 04/23/2022 1824 Last data filed at 04/23/2022 0900 Gross per 24 hour  Intake --  Output 400 ml  Net -400 ml   Filed Weights   04/21/22 2059 04/22/22 0100 04/23/22 0300  Weight: 56.2 kg 49.8 kg 51.1 kg    Examination:   Constitutional: NAD, AAOx3 HEENT: conjunctivae and lids normal, EOMI CV: No cyanosis.   RESP: normal respiratory effort, on RA Neuro: II - XII grossly intact.   Psych: depressed mood and affect.  Data Reviewed: I have personally reviewed labs and imaging studies  Time spent: 50 minutes  Enzo Bi, MD [facility] If 7PM-7AM, please contact night-coverage 04/23/2022, 6:24 PM

## 2022-04-23 NOTE — Evaluation (Signed)
Physical Therapy Evaluation Patient Details Name: Beth Carroll MRN: [id number] DOB: 1970/04/03 Today's Date: 04/23/2022  History of Present Illness  Pt is a 52 y/o F admitted on 04/21/22 after presenting with c/o vomiting & diarrhea. Upon arrival pt was somnolent, hypothermic, tachypneic, & hypotensive. Lab work consistent with DKA, pseudohyponatremia, AKI, AGMA, transaminitis, lactic acidosis, leukocytosis with L shift & possible UTI. Chest x-ray concerning for PNA. Pt is being treated for severe DKA & metabolic acidosis in the setting of suspected sepsis 2/2 suspected UTI versus intraabdominal infection with hypotension.  Clinical Impression  Pt seen for PT evaluation with pt agreeable to tx but does require cuing for OOB mobility. Prior to admission pt was transferring to w/c with assistance from family member or via lateral scoot (pt does request slide board for increased ease). On this date, pt requires max assist for bed mobility but pt is able to complete lateral scoot bed>drop arm recliner with min assist with education re: head/hips relationship & significantly extra time for all mobility. Pt with decreased BLE & buttocks skin integrity.  Pt with c/o soreness in R calf, tender to touch, & MD notified. Pt would benefit from ongoing PT services to address strength, balance, endurance to reduce fall risk & decrease caregiver burden.     Recommendations for follow up therapy are one component of a multi-disciplinary discharge planning process, led by the attending physician.  Recommendations may be updated based on patient status, additional functional criteria and insurance authorization.  Follow Up Recommendations       Assistance Recommended at Discharge Frequent or constant Supervision/Assistance  Patient can return home with the following  A lot of help with walking and/or transfers;A lot of help with bathing/dressing/bathroom;Assist for transportation;Help with stairs or ramp for  entrance;Assistance with cooking/housework    Equipment Recommendations  (slide board per pt request)  Recommendations for Other Services  OT consult    Functional Status Assessment Patient has had a recent decline in their functional status and demonstrates the ability to make significant improvements in function in a reasonable and predictable amount of time.     Precautions / Restrictions Precautions Precautions: Fall Restrictions Weight Bearing Restrictions: No      Mobility  Bed Mobility Overal bed mobility: Needs Assistance Bed Mobility: Supine to Sit     Supine to sit: Max assist, HOB elevated     General bed mobility comments: cuing for technique/sequencing, assistance to move BLE towards EOB & upright trunk, extra time    Transfers Overall transfer level: Needs assistance Equipment used: None Transfers: Bed to chair/wheelchair/BSC            Lateral/Scoot Transfers: Min assist General transfer comment: Lateral scoot bed>drop arm recliner on R with extra time (slightly elevated surface to slightly lower surface), education re: head/hips relationship    Ambulation/Gait                  Stairs            Wheelchair Mobility    Modified Rankin (Stroke Patients Only)       Balance Overall balance assessment: Needs assistance Sitting-balance support: Feet supported, Bilateral upper extremity supported Sitting balance-Leahy Scale: Fair                                       Pertinent Vitals/Pain Pain Assessment Pain Assessment: Faces Faces Pain Scale: Hurts even more Pain  Location: R calf Pain Descriptors / Indicators: Sore Pain Intervention(s): Limited activity within patient's tolerance, Monitored during session, Repositioned (notified MD & RN)    Home Living Family/patient expects to be discharged to:: Private residence Living Arrangements: Spouse/significant other;Children Available Help at Discharge:  Family;Available 24 hours/day Type of Home: Mobile home Home Access: Stairs to enter Entrance Stairs-Rails: Can reach both;Left;Right Entrance Stairs-Number of Steps: 3   Home Layout: One level Home Equipment: [place] (2 wheels);BSC/3in1;Shower seat;Wheelchair - manual;Hospital bed      Prior Function               Mobility Comments: Family assists with bumping pt up steps in w/c, pt performs lateral scoot or transfers with assistance from family member. Receiving HHPT & working on walking.       Hand Dominance        Extremity/Trunk Assessment   Upper Extremity Assessment Upper Extremity Assessment: Generalized weakness    Lower Extremity Assessment Lower Extremity Assessment: Generalized weakness (L foot inverts, BLE edema)    Cervical / Trunk Assessment Cervical / Trunk Assessment:  (redness to buttocks)  Communication   Communication: No difficulties  Cognition Arousal/Alertness: Awake/alert Behavior During Therapy: WFL for tasks assessed/performed Overall Cognitive Status: Within Functional Limits for tasks assessed                                 General Comments: requires encouragement/education for OOB mobility        General Comments      Exercises     Assessment/Plan    PT Assessment Patient needs continued PT services  PT Problem List Decreased mobility;Decreased skin integrity;Decreased balance;Decreased activity tolerance;Decreased knowledge of use of DME;Decreased range of motion;Decreased strength;Pain       PT Treatment Interventions DME instruction;Therapeutic exercise;Wheelchair mobility training;Gait training;Balance training;Neuromuscular re-education;Functional mobility training;Therapeutic activities;Patient/family education;Modalities    PT Goals (Current goals can be found in the Care Plan section)  Acute Rehab PT Goals Patient Stated Goal: decreased pain PT Goal Formulation: With patient Time  For Goal Achievement: 05/07/22 Potential to Achieve Goals: Fair    Frequency Min 2X/week     Co-evaluation               AM-PAC PT "6 Clicks" Mobility  Outcome Measure Help needed turning from your back to your side while in a flat bed without using bedrails?: A Lot Help needed moving from lying on your back to sitting on the side of a flat bed without using bedrails?: Total Help needed moving to and from a bed to a chair (including a wheelchair)?: A Little Help needed standing up from a chair using your arms (e.g., wheelchair or bedside chair)?: Total Help needed to walk in hospital room?: Total Help needed climbing 3-5 steps with a railing? : Total 6 Click Score: 9    End of Session   Activity Tolerance: Patient tolerated treatment well Patient left: in chair;with call bell/phone within reach;with chair alarm set Nurse Communication: Mobility status (c/o R calf soreness) PT Visit Diagnosis: Muscle weakness (generalized) (M62.81);Other abnormalities of gait and mobility (R26.89);Difficulty in walking, not elsewhere classified (R26.2)    Time: [id number] PT Time Calculation (min) (ACUTE ONLY): 23 min   Charges:   PT Evaluation $PT Eval Moderate Complexity: [place], PT, DPT 04/23/22, 3:25 PM   [name] 04/23/2022, 3:23 PM

## 2022-04-23 NOTE — Progress Notes (Signed)
Upon transfer from chair back to bed patient noted to have purple discoloration to R foot and cool to the touch,dorsalis pedal pulse weak,capillary refill less than 3 seconds. Patient able to move foot and has sensation. Notified Dr. Billie Ruddy, elevated foot as per verbal order.

## 2022-04-24 ENCOUNTER — Encounter
Admission: EM | Disposition: A | Discharge status: Home / Self Care | Payer: Self-pay | Source: Home / Self Care | Attending: Hospitalist

## 2022-04-24 DIAGNOSIS — E1110 Type 2 diabetes mellitus with ketoacidosis without coma: Secondary | ICD-10-CM | POA: Diagnosis not present

## 2022-04-24 HISTORY — PX: LOWER EXTREMITY ANGIOGRAPHY: CATH118251

## 2022-04-24 LAB — PHOSPHORUS: Phosphorus: 2.6 mg/dL (ref 2.5–4.6)

## 2022-04-24 LAB — APTT
aPTT: 121 seconds — ABNORMAL HIGH (ref 24–36)
aPTT: 31 seconds (ref 24–36)
aPTT: 37 seconds — ABNORMAL HIGH (ref 24–36)
aPTT: 44 seconds — ABNORMAL HIGH (ref 24–36)

## 2022-04-24 LAB — BASIC METABOLIC PANEL
Anion gap: 9 (ref 5–15)
BUN: 12 mg/dL (ref 6–20)
CO2: 26 mmol/L (ref 22–32)
Calcium: 9.1 mg/dL (ref 8.9–10.3)
Chloride: 104 mmol/L (ref 98–111)
Creatinine, Ser: <0.3 mg/dL — ABNORMAL LOW (ref 0.44–1.00)
Glucose, Bld: 81 mg/dL (ref 70–99)
Potassium: 3.4 mmol/L — ABNORMAL LOW (ref 3.5–5.1)
Sodium: 139 mmol/L (ref 135–145)

## 2022-04-24 LAB — GLUCOSE, CAPILLARY
Glucose-Capillary: 105 mg/dL — ABNORMAL HIGH (ref 70–99)
Glucose-Capillary: 122 mg/dL — ABNORMAL HIGH (ref 70–99)
Glucose-Capillary: 158 mg/dL — ABNORMAL HIGH (ref 70–99)
Glucose-Capillary: 76 mg/dL (ref 70–99)
Glucose-Capillary: 81 mg/dL (ref 70–99)
Glucose-Capillary: 83 mg/dL (ref 70–99)

## 2022-04-24 LAB — CBC
HCT: 29.3 % — ABNORMAL LOW (ref 36.0–46.0)
Hemoglobin: 9.2 g/dL — ABNORMAL LOW (ref 12.0–15.0)
MCH: 26.7 pg (ref 26.0–34.0)
MCHC: 31.4 g/dL (ref 30.0–36.0)
MCV: 85.2 fL (ref 80.0–100.0)
Platelets: 189 10*3/uL (ref 150–400)
RBC: 3.44 MIL/uL — ABNORMAL LOW (ref 3.87–5.11)
RDW: 16.4 % — ABNORMAL HIGH (ref 11.5–15.5)
WBC: 5.8 10*3/uL (ref 4.0–10.5)
nRBC: 0 % (ref 0.0–0.2)

## 2022-04-24 LAB — MAGNESIUM: Magnesium: 1.9 mg/dL (ref 1.7–2.4)

## 2022-04-24 LAB — HEPARIN LEVEL (UNFRACTIONATED)
Heparin Unfractionated: 0.84 IU/mL — ABNORMAL HIGH (ref 0.30–0.70)
Heparin Unfractionated: 0.84 IU/mL — ABNORMAL HIGH (ref 0.30–0.70)
Heparin Unfractionated: >1.1 IU/mL — ABNORMAL HIGH (ref 0.30–0.70)

## 2022-04-24 SURGERY — LOWER EXTREMITY ANGIOGRAPHY
Anesthesia: Moderate Sedation | Laterality: Right

## 2022-04-24 MED ORDER — ALUM & MAG HYDROXIDE-SIMETH 200-200-20 MG/5ML PO SUSP
15.0000 mL | Freq: Four times a day (QID) | ORAL | Status: DC | PRN
  Filled 2022-04-24 (×3): qty 30

## 2022-04-24 MED ORDER — LACTATED RINGERS IV SOLN: INTRAVENOUS | Status: DC

## 2022-04-24 MED ORDER — INSULIN ASPART 100 UNIT/ML IJ SOLN
0.0000 [IU] | INTRAMUSCULAR | Status: DC
  Administered 2022-04-24: 3 [IU] via SUBCUTANEOUS
  Filled 2022-04-24: qty 1

## 2022-04-24 MED ORDER — NITROGLYCERIN 1 MG/10 ML FOR IR/CATH LAB
INTRA_ARTERIAL | Status: DC | PRN
  Administered 2022-04-24: 300 ug via INTRA_ARTERIAL

## 2022-04-24 MED ORDER — ALTEPLASE 2 MG IJ SOLR
INTRAMUSCULAR | Status: AC
  Filled 2022-04-24: qty 2

## 2022-04-24 MED ORDER — ALTEPLASE 2 MG IJ SOLR
INTRAMUSCULAR | Status: DC | PRN
  Administered 2022-04-24: 2 mg
  Administered 2022-04-24: 4 mg

## 2022-04-24 MED ORDER — FENTANYL CITRATE (PF) 100 MCG/2ML IJ SOLN
INTRAMUSCULAR | Status: DC | PRN
  Administered 2022-04-24: 50 ug via INTRAVENOUS
  Administered 2022-04-24: 25 ug via INTRAVENOUS

## 2022-04-24 MED ORDER — MORPHINE SULFATE (PF) 2 MG/ML IV SOLN
2.0000 mg | Freq: Once | INTRAVENOUS | Status: AC
  Administered 2022-04-24: 2 mg via INTRAVENOUS
  Filled 2022-04-24: qty 1

## 2022-04-24 MED ORDER — CALCIUM CARBONATE ANTACID 500 MG PO CHEW
1.0000 | CHEWABLE_TABLET | Freq: Three times a day (TID) | ORAL | Status: DC | PRN
  Administered 2022-04-24 – 2022-04-27 (×7): 200 mg via ORAL
  Filled 2022-04-24 (×8): qty 1

## 2022-04-24 MED ORDER — MIDAZOLAM HCL 5 MG/5ML IJ SOLN
INTRAMUSCULAR | Status: AC
  Filled 2022-04-24: qty 5

## 2022-04-24 MED ORDER — CEFAZOLIN (ANCEF) 1 G IV SOLR: 1.0000 g | INTRAVENOUS | Status: DC

## 2022-04-24 MED ORDER — IODIXANOL 320 MG/ML IV SOLN
INTRAVENOUS | Status: DC | PRN
  Administered 2022-04-24: 105 mL

## 2022-04-24 MED ORDER — HEPARIN SODIUM (PORCINE) 1000 UNIT/ML IJ SOLN
INTRAMUSCULAR | Status: DC | PRN
  Administered 2022-04-24: 5000 [IU] via INTRAVENOUS
  Administered 2022-04-24: 2500 [IU] via INTRAVENOUS

## 2022-04-24 MED ORDER — CEFAZOLIN SODIUM-DEXTROSE 2-4 GM/100ML-% IV SOLN
INTRAVENOUS | Status: AC
  Filled 2022-04-24: qty 100

## 2022-04-24 MED ORDER — ASPIRIN 81 MG PO TBEC
81.0000 mg | DELAYED_RELEASE_TABLET | Freq: Every day | ORAL | Status: DC
  Administered 2022-04-24 – 2022-04-28 (×5): 81 mg via ORAL
  Filled 2022-04-24 (×5): qty 1

## 2022-04-24 MED ORDER — MIDAZOLAM HCL 2 MG/2ML IJ SOLN
INTRAMUSCULAR | Status: DC | PRN
  Administered 2022-04-24: 2 mg via INTRAVENOUS
  Administered 2022-04-24: 1 mg via INTRAVENOUS

## 2022-04-24 MED ORDER — INSULIN ASPART 100 UNIT/ML IJ SOLN
0.0000 [IU] | Freq: Three times a day (TID) | INTRAMUSCULAR | Status: DC
  Administered 2022-04-25 – 2022-04-26 (×4): 4 [IU] via SUBCUTANEOUS
  Administered 2022-04-26: 15 [IU] via SUBCUTANEOUS
  Filled 2022-04-24 (×5): qty 1

## 2022-04-24 MED ORDER — ALTEPLASE 2 MG IJ SOLR
INTRAMUSCULAR | Status: AC
  Filled 2022-04-24: qty 4

## 2022-04-24 MED ORDER — HEPARIN BOLUS VIA INFUSION
1500.0000 [IU] | Freq: Once | INTRAVENOUS | Status: AC
  Administered 2022-04-24: 1500 [IU] via INTRAVENOUS
  Filled 2022-04-24: qty 1500

## 2022-04-24 MED ORDER — MORPHINE SULFATE (PF) 2 MG/ML IV SOLN
2.0000 mg | INTRAVENOUS | Status: DC | PRN
  Administered 2022-04-24 – 2022-04-28 (×3): 2 mg via INTRAVENOUS
  Filled 2022-04-24 (×3): qty 1

## 2022-04-24 MED ORDER — MORPHINE SULFATE (PF) 2 MG/ML IV SOLN
1.0000 mg | INTRAVENOUS | Status: DC | PRN
  Administered 2022-04-24: 1 mg via INTRAVENOUS
  Filled 2022-04-24: qty 1

## 2022-04-24 MED ORDER — CEFAZOLIN SODIUM-DEXTROSE 1-4 GM/50ML-% IV SOLN
INTRAVENOUS | Status: AC | PRN
  Administered 2022-04-24: 2 g via INTRAVENOUS

## 2022-04-24 MED ORDER — NITROGLYCERIN 1 MG/10 ML FOR IR/CATH LAB
INTRA_ARTERIAL | Status: AC
  Filled 2022-04-24: qty 10

## 2022-04-24 MED ORDER — HEPARIN SODIUM (PORCINE) 1000 UNIT/ML IJ SOLN
INTRAMUSCULAR | Status: AC
  Filled 2022-04-24: qty 10

## 2022-04-24 MED ORDER — FENTANYL CITRATE (PF) 100 MCG/2ML IJ SOLN
INTRAMUSCULAR | Status: AC
  Filled 2022-04-24: qty 2

## 2022-04-24 MED ORDER — CEFAZOLIN SODIUM-DEXTROSE 2-4 GM/100ML-% IV SOLN
2.0000 g | INTRAVENOUS | Status: DC
  Filled 2022-04-24: qty 100

## 2022-04-24 SURGICAL SUPPLY — 28 items
BALLN LUTONIX 018 6X40X130 (BALLOONS) ×1
BALLN ULTRVRSE 2.5X300X150 (BALLOONS) ×1
BALLN ULTRVRSE 3X40X150 (BALLOONS) ×1
BALLN ULTRVRSE 3X40X150 OTW (BALLOONS) ×1
BALLOON LUTONIX 018 6X40X130 (BALLOONS) IMPLANT
BALLOON ULTRVRSE 2.5X300X150 (BALLOONS) IMPLANT
BALLOON ULTRVRSE 3X40X150 OTW (BALLOONS) IMPLANT
CANISTER PENUMBRA ENGINE (MISCELLANEOUS) IMPLANT
CATH ANGIO 5F PIGTAIL 65CM (CATHETERS) IMPLANT
CATH INDIGO CAT6 KIT (CATHETERS) IMPLANT
CATH INDIGO SEP 6 (CATHETERS) IMPLANT
CATH KUMPE SOFT-VU 5FR 65 (CATHETERS) IMPLANT
COVER PROBE ULTRASOUND 5X96 (MISCELLANEOUS) IMPLANT
DEVICE STARCLOSE SE CLOSURE (Vascular Products) IMPLANT
GLIDEWIRE ADV .035X260CM (WIRE) IMPLANT
GOWN STRL REUS W/ TWL LRG LVL3 (GOWN DISPOSABLE) ×1 IMPLANT
GOWN STRL REUS W/TWL LRG LVL3 (GOWN DISPOSABLE) ×1
KIT ENCORE 26 ADVANTAGE (KITS) IMPLANT
NDL ENTRY 21GA 7CM ECHOTIP (NEEDLE) IMPLANT
NEEDLE ENTRY 21GA 7CM ECHOTIP (NEEDLE) ×1 IMPLANT
PACK ANGIOGRAPHY (CUSTOM PROCEDURE TRAY) ×1 IMPLANT
SET INTRO CAPELLA COAXIAL (SET/KITS/TRAYS/PACK) IMPLANT
SHEATH BRITE TIP 5FRX11 (SHEATH) IMPLANT
SHEATH PINNACLE MP 6F 45CM (SHEATH) IMPLANT
SYR MEDRAD MARK 7 150ML (SYRINGE) IMPLANT
TUBING CONTRAST HIGH PRESS 72 (TUBING) IMPLANT
WIRE G V18X300CM (WIRE) IMPLANT
WIRE GUIDERIGHT .035X150 (WIRE) IMPLANT

## 2022-04-24 NOTE — Progress Notes (Signed)
ANTICOAGULATION CONSULT NOTE -   Pharmacy Consult for Heparin  Indication: arterial occlusion right lower extremity  No Known Allergies  Patient Measurements: Height: 5\' 6"  (167.6 cm) Weight: 55.7 kg (122 lb 12.7 oz) IBW/kg (Calculated) : 59.3 Heparin Dosing Weight: 49.8 kg   Vital Signs: Temp: 98.2 F (36.8 C) (03/30 1030) Temp Source: Oral (03/30 1030) BP: 122/77 (03/30 1030) Pulse Rate: 78 (03/30 1030)  Labs: Recent Labs    04/22/22 0448 04/22/22 0837 04/22/22 1210 04/23/22 0731 04/23/22 2352 04/24/22 0405 04/24/22 1253  HGB 9.7*  --   --  9.1*  --  9.2*  --   HCT 30.4*  --   --  28.8*  --  29.3*  --   PLT 301  --   --  240  --  189  --   APTT  --   --   --   --  31 37* 121*  HEPARINUNFRC  --   --   --   --  >1.10* 0.84* 0.84*  CREATININE 0.99   < > 0.56 0.44  --  <0.30*  --    < > = values in this interval not displayed.     CrCl cannot be calculated (This lab value cannot be used to calculate CrCl because it is not a number: <0.30).   Medical History: Past Medical History:  Diagnosis Date   Coronary artery disease    Diabetes mellitus without complication ([place])    Diabetes mellitus, type 2 ([facility])    HFrEF (heart failure with reduced ejection fraction) ([place])    Hyperlipemia    Hypertension    Ischemic cardiomyopathy    Orthostatic hypotension    PAD (peripheral artery disease) (H[facility])    Tobacco use     Medications:  Medications Prior to Admission  Medication Sig Dispense Refill Last Dose   acetaminophen (TYLENOL) 650 MG CR tablet Take 650 mg by mouth every 8 (eight) hours as needed for pain.   Past Week   albuterol (VENTOLIN HFA) 108 (90 Base) MCG/ACT inhaler Inhale 2 puffs into the lungs every 6 (six) hours as needed for wheezing or shortness of breath.   Past Week   aspirin EC 81 MG tablet Take 81 mg by mouth daily. Swallow whole.   Past Week   atorvastatin (LIPITOR) 20 MG tablet Take 1 tablet (20 mg total) by mouth daily. 30 tablet 0 Past Week    barrier cream (NON-SPECIFIED) CREA Apply 1 Application topically 2 (two) times daily as needed.   Past Week   carvedilol (COREG) 3.125 MG tablet Take 3.125 mg by mouth 2 (two) times daily with a meal.   Past Week   dapagliflozin propanediol (FARXIGA) 10 MG TABS tablet TAKE 1 TABLET BY MOUTH EVERY DAY BEFORE BREAKFAST (Patient taking differently: Take 10 mg by mouth daily before breakfast.) 90 tablet 0 Past Week   ELIQUIS 5 MG TABS tablet TAKE 1 TABLET BY MOUTH TWICE A DAY 60 tablet 3 Past Week   hydrochlorothiazide (HYDRODIURIL) 25 MG tablet Take 25 mg by mouth daily.   Past Week   ibuprofen (ADVIL) 600 MG tablet Take 600 mg by mouth every 6 (six) hours as needed for mild pain.   Past Week   insulin glargine (LANTUS SOLOSTAR) 100 UNIT/ML Solostar Pen Inject 25 Units into the skin daily.   Past Week   Multiple Vitamins-Minerals (CENTRUM SILVER 50+WOMEN PO) Take 1 tablet by mouth daily.   Past Week   nystatin cream (MYCOSTATIN) Apply 1  Application topically 2 (two) times daily.   Past Week   pantoprazole (PROTONIX) 40 MG tablet Take 1 tablet (40 mg total) by mouth daily. 30 tablet 0 Past Week   tiotropium (SPIRIVA) 18 MCG inhalation capsule Place 1 capsule into inhaler and inhale daily.   Past Week   traMADol (ULTRAM) 50 MG tablet Take 50 mg by mouth every 6 (six) hours as needed for severe pain or moderate pain.   Past Week   Vitamin D, Ergocalciferol, (DRISDOL) 1.25 MG (50000 UNIT) CAPS capsule Take 50,000 Units by mouth once a week.   Past Week   feeding supplement (ENSURE ENLIVE / ENSURE PLUS) LIQD Take 237 mLs by mouth 3 (three) times daily between meals. 237 mL 12    nutrition supplement, JUVEN, (JUVEN) PACK Take 1 packet by mouth 2 (two) times daily between meals. 60 each 0    Semaglutide,0.25 or 0.5MG /DOS, (OZEMPIC, 0.25 OR 0.5 MG/DOSE,) 2 MG/3ML SOPN Inject 0.25 mg into the skin once a week. After four weeks increase to 0.5 mg weekly, then after four weeks increase to 0.75 mg weekly. (Patient  not taking: Reported on 04/22/2022)   Not Taking    Assessment: Pharmacy consulted to dose heparin in this 52 year old female admitted with DKA, now with arterial occlusion of lower extremity, was on Eliquis PTA for VTE prophylaxis.   Pt received Eliquis 2.5 mg PO X 1 on 3/29 @ 1122.  CrCl = 66.4 ml/min  3/30 @ 0405: aPTT = 37,  HL = 0.84  inc from 600 to 800 u/hr 3/30 @1253  aPTT= 121,  HL= 0.84,   dec from 800 to 700 u/hr  Goal of Therapy:  Heparin level 0.3-0.7 units/ml aPTT 66 - 102  seconds Monitor platelets by anticoagulation protocol: Yes   Plan:  3/30 @1253  aPTT= 121,  HL= 0.84 - Will decrease drip rate to 700 units/hr. - Will recheck aPTT 6 hrs after rate change - Will use aPTT to guide dosing until correlating with HL> CBC daily   Kerriann Kamphuis A 04/24/2022,1:55 PM

## 2022-04-24 NOTE — CV Procedure (Signed)
[name]  Percutaneous Study/Intervention Procedural Note   Date of Surgery: 04/24/2022  Surgeon(s):[name], [name], MD  Assistants:none  Pre-operative Diagnosis: PAD with recurrent right superficial femoral and popliteal artery occlusion  Post-operative diagnosis:  Same, with right common femoral occlusion as well  Procedure(s) Performed:             1.  Ultrasound guidance for vascular access left femoral artery             2.  Catheter placement into right tibioperoneal trunk via left femoral access             3.  Aortogram and selective right lower extremity angiogram             4.  Percutaneous transluminal angioplasty of right posterior tibial artery 2.5 mm             5.  Penumbra aspiration thrombectomy of right common femoral artery, superficial femoral distribution, popliteal as well, right deep femoral origin  6.  6 mm drug-eluting PTA of right SFA origin and common femoral             7.  3 mm PTA of right deep femoral origin  8.  StarClose closure device left femoral artery  EBL: 250 cc  Contrast: 105 cc  Fluoro Time: 9.7 minutes  Moderate Conscious Sedation Time: approximately 90 minutes using 3 mg of Versed and 75 mcg of Fentanyl              Indications:  Patient is a 52 y.o.female with recurrent thrombosis of right SFA and popliteal distribution, status post 2 interventions in the past.  She presented with some violaceous discoloration, but no motor or sensory deficit.. The patient has noninvasive study showing right SFA and popliteal occlusion.   The patient is brought in for angiography for further evaluation and potential treatment.  Due to the limb threatening nature of the situation, angiogram was performed for attempted limb salvage. The patient is aware that if the procedure fails, amputation would be expected.  The patient also understands that even with successful revascularization, amputation may still be required due to the  severity of the situation. Risks and benefits are discussed and informed consent is obtained.   Procedure:  The patient was identified and appropriate procedural time out was performed.  The patient was then placed supine on the table and prepped and draped in the usual sterile fashion. Moderate conscious sedation was administered during a face to face encounter with the patient throughout the procedure with my supervision of the RN administering medicines and monitoring the patient's vital signs, pulse oximetry, telemetry and mental status throughout from the start of the procedure until the patient was taken to the recovery room. Ultrasound was used to evaluate the left common femoral artery.  It was patent .  A digital ultrasound image was acquired.  A Seldinger needle was used to access the left common femoral artery under direct ultrasound guidance and a permanent image was performed.  A 0.035 J wire was advanced without resistance and a 5Fr sheath was placed.  Pigtail catheter was placed into the aorta and obliques of the iliac system were performed.   Next, 5000 units was given after sequential runs have been performed down the legs demonstrating right SFA and popliteal occlusion with poor reconstitution, right common femoral occlusion.  Using the pigtail catheter and J-wire this was inserted down to the terminal external iliac and an exchange made for  a Glidewire advantage.  Pigtail was removed as was the smaller sheath, and a 6 [place] destination sheath was advanced.  Glidewire was advanced into the SFA.  Penumbra 6 was advanced over this 035 system, and an exchange made for a V18 control wire.  Aspiration thrombectomy was performed of the common femoral and the superficial femoral after instituting 4 mg of tPA locally.  Once the catheter was passed into the SFA, the separator was used.  The penumbra was removed several times to flush this demonstrating a significant amount of clot.  Number was  advanced down the SFA in this fashion utilizing the separator.  Follow-up and demonstrated good clearance of contrast from the stented SFA and popliteal distributions, high-grade stenosis in the proximal posterior tibial artery.  Over the V18 control wire at 250 cm 2.5 mm balloon was used, and this demonstrated excellent result.  Imaging there is then performed of the groin once again, there is a stenosis at the proximal part of the SFA and common femoral junction.  A 6 mm drug-eluting balloon was used here.  Next, using an angled catheter with an RAO view, cannulation was performed of the deep femoral origin which had been occluded.  V18 control wire was negotiated into the profunda beyond this, and aspiration thrombectomy performed and then 3 mm PTA.  Follow-up on demonstrated patency of both the SFA and the deep distribution with some dissection plane present but not flow-limiting.  Completion rounds were then performed down the leg but there was occlusion of the posterior tibial artery.  SFA was cannulated once again with the angled catheter and stiff Glidewire advantage and this was advanced down to the posterior tibial, exchange made for new V18 control wire which was placed in the posterior tibial and aspiration thrombectomy performed of the tibioperoneal trunk and proximal posterior tibial and ballooning reperformed with 2.5 mm balloon.  Nitroglycerin was given.  Follow-up runs demonstrate patency to the level of the foot, with some oscillatory flow here from spasm.  There is no evidence of macro embolization.  Imaging was performed back of the leg demonstrating brisk clearance of contrast through the SFA down the anterior tibial, as well as the posterior tibial, and brisk clearance across the femoral bifurcation.  Of note, there was significant dissection plane here, but not flow-limiting.  She may eventually require endarterectomy.   I elected to terminate the procedure. The sheath was removed  and StarClose closure device was deployed in the left femoral artery with excellent hemostatic result. The patient was taken to the hospital room in stable condition having tolerated the procedure well.  Findings:               Aortogram: Widely patent terminal aorta and iliac systems             Right lower Extremity: Initial occluded right common femoral artery, deep femoral origin, SFA and popliteal with poor filling distally.  Collateral flow from internal iliac filling branches of the profunda was salvaging the leg. Postintervention, brisk filling inline flow of the deep femoral and superficial femoral with dissection plane at the origins was present, with two-vessel runoff into the foot.   Disposition: Patient was taken to the recovery room in stable condition having tolerated the procedure well.  Complications: None  Zara Chess 04/24/2022 9:48 AM   This note was created with Dragon Medical transcription system. Any errors in dictation are purely unintentional.

## 2022-04-24 NOTE — Evaluation (Signed)
Occupational Therapy Evaluation Patient Details Name: Beth Carroll MRN: [id number] DOB: [birth year] Today's Date: 04/24/2022   History of Present Illness Pt is a 53 y/o F admitted on 04/21/22 after presenting with c/o vomiting & diarrhea. Upon arrival pt was somnolent, hypothermic, tachypneic, & hypotensive. Lab work consistent with DKA, pseudohyponatremia, AKI, AGMA, transaminitis, lactic acidosis, leukocytosis with L shift & possible UTI. Chest x-ray concerning for PNA. Pt is being treated for severe DKA & metabolic acidosis in the setting of suspected sepsis 2/2 suspected UTI versus intraabdominal infection with hypotension.   Clinical Impression   RN cleared patient for participation in therapy. Pt agreeable to OT evaluation. Spouse present. Pt presenting with decreased independence in self care, balance, functional mobility/transfers, and endurance. PTA pt received assistance from family for bathing, IADLs, and lateral scoot transfer to w/c or BSC. Pt currently functioning at CGA-Min A for bed mobility, Min A for UB dressing, and set up-supervision for seated grooming tasks. Pt c/o dizziness and nausea with activity. +orthostatics during session (RN and MD notified). Pt will benefit from skilled acute OT services to address deficits noted below. OT recommends ongoing therapy upon discharge to maximize safety and independence with ADLs, decrease fall risk, decrease caregiver burden, and promote return to PLOF.    BP in supine (LUE): 101/66 BP in sitting (LUE): 78/58     Recommendations for follow up therapy are one component of a multi-disciplinary discharge planning process, led by the attending physician.  Recommendations may be updated based on patient status, additional functional criteria and insurance authorization.   Assistance Recommended at Discharge Frequent or constant Supervision/Assistance  Patient can return home with the following A lot of help with walking and/or transfers;A lot  of help with bathing/dressing/bathroom;Assistance with cooking/housework;Assist for transportation;Help with stairs or ramp for entrance    Functional Status Assessment  Patient has had a recent decline in their functional status and demonstrates the ability to make significant improvements in function in a reasonable and predictable amount of time.  Equipment Recommendations  None recommended by OT    Recommendations for Other Services       Precautions / Restrictions Precautions Precautions: Fall Restrictions Weight Bearing Restrictions: No      Mobility Bed Mobility Overal bed mobility: Needs Assistance Bed Mobility: Supine to Sit, Sit to Supine     Supine to sit: Min guard, HOB elevated Sit to supine: Min assist, HOB elevated   General bed mobility comments: Increased time/effort required, only Min A needed for repositioning BLEs in bed once returned to supine. Pt motivated to complete on her own.    Transfers                   General transfer comment: Did not attempt functional transfers this date due to safety concerns with low BP and dizziness/nausea during activity.      Balance Overall balance assessment: Needs assistance Sitting-balance support: Feet supported Sitting balance-Leahy Scale: Good         ADL either performed or assessed with clinical judgement   ADL Overall ADL's : Needs assistance/impaired     Grooming: Set up;Supervision/safety;Sitting;Wash/dry face           Upper Body Dressing : Bed level;Minimal assistance Upper Body Dressing Details (indicate cue type and reason): to don/doff gown 2/2 managing IV lines Lower Body Dressing: Minimal assistance;Sitting/lateral leans Lower Body Dressing Details (indicate cue type and reason): anticipate  General ADL Comments: Evaluation limited due to pt c/o dizziness and nausea with activity (+orthostatics).     Vision Baseline Vision/History: 1 Wears  glasses Patient Visual Report: No change from baseline       Perception     Praxis      Pertinent Vitals/Pain Pain Assessment Pain Assessment: Faces Faces Pain Scale: Hurts a little bit Pain Location: R calf Pain Descriptors / Indicators: Sore Pain Intervention(s): Limited activity within patient's tolerance, Monitored during session, Repositioned     Hand Dominance Right   Extremity/Trunk Assessment Upper Extremity Assessment Upper Extremity Assessment: Generalized weakness   Lower Extremity Assessment Lower Extremity Assessment: Generalized weakness       Communication Communication Communication: No difficulties   Cognition Arousal/Alertness: Awake/alert Behavior During Therapy: WFL for tasks assessed/performed Overall Cognitive Status: Within Functional Limits for tasks assessed           General Comments       Exercises Other Exercises Other Exercises: OT provided education re: role of OT, OT POC, post acute recs, sitting up for all meals, EOB/OOB mobility with assistance, home/fall safety.     Shoulder Instructions      Home Living Family/patient expects to be discharged to:: Private residence Living Arrangements: Spouse/significant other;Children Available Help at Discharge: Family;Available 24 hours/day Type of Home: Mobile home Home Access: Stairs to enter Entrance Stairs-Number of Steps: 3 Entrance Stairs-Rails: Can reach both;Left;Right Home Layout: One level     Bathroom Shower/Tub: Teacher, early years/pre: Standard     Home Equipment: Cane - Holiday representative (2 wheels);BSC/3in1;Shower seat;Wheelchair - manual;Hospital bed          Prior Functioning/Environment Prior Level of Function : Needs assist       Physical Assist : Mobility (physical);ADLs (physical) Mobility (physical): Transfers;Gait;Stairs ADLs (physical): Bathing Mobility Comments: Family assists with bumping pt up steps in w/c, pt performs lateral scoot  or transfers with assistance from family member. Receiving HHPT & working on walking. ADLs Comments: Prior to recent hospitalizations pt reports being independent in ADLs/IADLs. Recently receiving assistance for IADLs and bathing from family. Daughter assists with wound care management.        OT Problem List: Decreased strength;Decreased range of motion;Decreased activity tolerance;Impaired balance (sitting and/or standing);Pain;Decreased knowledge of use of DME or AE      OT Treatment/Interventions: Self-care/ADL training;Therapeutic exercise;Neuromuscular education;Energy conservation;DME and/or AE instruction;Manual therapy;Modalities;Balance training;Patient/family education;Visual/perceptual remediation/compensation;Cognitive remediation/compensation;Therapeutic activities;Splinting    OT Goals(Current goals can be found in the care plan section) Acute Rehab OT Goals Patient Stated Goal: get stronger, return home OT Goal Formulation: With patient/family Time For Goal Achievement: 05/08/22 Potential to Achieve Goals: Fair   OT Frequency: Min 2X/week    Co-evaluation              AM-PAC OT "6 Clicks" Daily Activity     Outcome Measure Help from another person eating meals?: None Help from another person taking care of personal grooming?: A Little Help from another person toileting, which includes using toliet, bedpan, or urinal?: Total Help from another person bathing (including washing, rinsing, drying)?: A Lot Help from another person to put on and taking off regular upper body clothing?: A Little Help from another person to put on and taking off regular lower body clothing?: A Lot 6 Click Score: 15   End of Session Nurse Communication: Mobility status;Other (comment) (notified MD and RN of +orthostatics)  Activity Tolerance: Patient limited by fatigue;Other (comment) (dizziness/nausea) Patient left: in bed;with call  bell/phone within reach;with bed alarm set;with  family/visitor present  OT Visit Diagnosis: Dizziness and giddiness (R42);Muscle weakness (generalized) (M62.81);Other abnormalities of gait and mobility (R26.89);Pain Pain - Right/Left: Right Pain - part of body: Leg                Time: [id number] OT Time Calculation (min): 34 min Charges:  OT General Charges $OT Visit: 1 Visit OT Evaluation $OT Eval Moderate Complexity: 1 Mod  [facility] MS, OTR/L ascom [phone]  04/24/22, 6:27 PM

## 2022-04-24 NOTE — Progress Notes (Addendum)
       CROSS COVER NOTE  NAME: Beth Carroll MRN: [id number] DOB : 16-Jan-1971    HPI/Events of Note   Report: notified around 2030 last evening regarding discolored cold left foot and inability to obtain pulse by doppler  On review of chart:DKA no on SQ insulin, Bilateral heel and sacral wounds, severe PAD with several stents with revisions BLE in past    Assessment and  Interventions   Assessment: No pedal, post tib or popliteal puls eobtainable by doppler Ultrasound IMPRESSION: Occluded femoral and popliteal arterial stent with distal monophasic waveforms consistent with the lack of significant inflow.     Plan: Vascular services contacted - Dr Shelia Media in to see patient and plan for intervention this am Started on IV heparin Eliquis on hold NPO since little after MN Insulin regimen changed to every 4h resistant scale and long acting disontinued till diet resumed Morphine ordered for pain     Kathlene Cote NP [facility]

## 2022-04-24 NOTE — Progress Notes (Signed)
Rachael Fee NP notified increases pain in rt foot, cool to the touch, no pulse, orders given

## 2022-04-24 NOTE — Progress Notes (Signed)
Patient here today for right lower extremity angiogram with possible intervention per Dr Shelia Media, vitals stable upon ariival. Alert and oriented. No audible doppler pulses heard upon assessment.

## 2022-04-24 NOTE — Progress Notes (Addendum)
ANTICOAGULATION CONSULT NOTE - Initial Consult  Pharmacy Consult for Heparin  Indication: arterial occlusion right lower extremity  No Known Allergies  Patient Measurements: Height: 5\' 6"  (167.6 cm) Weight: 51.1 kg (112 lb 10.5 oz) IBW/kg (Calculated) : 59.3 Heparin Dosing Weight: 49.8 kg   Vital Signs: Temp: 99.2 F (37.3 C) (03/29 2004) Temp Source: Oral (03/29 2004) BP: 125/74 (03/29 2004) Pulse Rate: 81 (03/29 2004)  Labs: Recent Labs    04/21/22 2038 04/22/22 0126 04/22/22 0448 04/22/22 0837 04/22/22 1210 04/23/22 0731 04/23/22 2352  HGB 12.5  --  9.7*  --   --  9.1*  --   HCT 44.5  --  30.4*  --   --  28.8*  --   PLT 446*  --  301  --   --  240  --   APTT  --   --   --   --   --   --  31  HEPARINUNFRC  --   --   --   --   --   --  >1.10*  CREATININE 1.54*   < > 0.99 0.70 0.56 0.44  --    < > = values in this interval not displayed.    Estimated Creatinine Clearance: 66.4 mL/min (by C-G formula based on SCr of 0.44 mg/dL).   Medical History: Past Medical History:  Diagnosis Date   Coronary artery disease    Diabetes mellitus without complication ([place])    Diabetes mellitus, type 2 (H[facility])    HFrEF (heart failure with reduced ejection fraction) ([place])    Hyperlipemia    Hypertension    Ischemic cardiomyopathy    Orthostatic hypotension    PAD (peripheral artery disease) (H[facility])    Tobacco use     Medications:  Medications Prior to Admission  Medication Sig Dispense Refill Last Dose   acetaminophen (TYLENOL) 650 MG CR tablet Take 650 mg by mouth every 8 (eight) hours as needed for pain.   Past Week   albuterol (VENTOLIN HFA) 108 (90 Base) MCG/ACT inhaler Inhale 2 puffs into the lungs every 6 (six) hours as needed for wheezing or shortness of breath.   Past Week   aspirin EC 81 MG tablet Take 81 mg by mouth daily. Swallow whole.   Past Week   atorvastatin (LIPITOR) 20 MG tablet Take 1 tablet (20 mg total) by mouth daily. 30 tablet 0 Past Week   barrier  cream (NON-SPECIFIED) CREA Apply 1 Application topically 2 (two) times daily as needed.   Past Week   carvedilol (COREG) 3.125 MG tablet Take 3.125 mg by mouth 2 (two) times daily with a meal.   Past Week   dapagliflozin propanediol (FARXIGA) 10 MG TABS tablet TAKE 1 TABLET BY MOUTH EVERY DAY BEFORE BREAKFAST (Patient taking differently: Take 10 mg by mouth daily before breakfast.) 90 tablet 0 Past Week   ELIQUIS 5 MG TABS tablet TAKE 1 TABLET BY MOUTH TWICE A DAY 60 tablet 3 Past Week   hydrochlorothiazide (HYDRODIURIL) 25 MG tablet Take 25 mg by mouth daily.   Past Week   ibuprofen (ADVIL) 600 MG tablet Take 600 mg by mouth every 6 (six) hours as needed for mild pain.   Past Week   insulin glargine (LANTUS SOLOSTAR) 100 UNIT/ML Solostar Pen Inject 25 Units into the skin daily.   Past Week   Multiple Vitamins-Minerals (CENTRUM SILVER 50+WOMEN PO) Take 1 tablet by mouth daily.   Past Week   nystatin cream (MYCOSTATIN) Apply 1  Application topically 2 (two) times daily.   Past Week   pantoprazole (PROTONIX) 40 MG tablet Take 1 tablet (40 mg total) by mouth daily. 30 tablet 0 Past Week   tiotropium (SPIRIVA) 18 MCG inhalation capsule Place 1 capsule into inhaler and inhale daily.   Past Week   traMADol (ULTRAM) 50 MG tablet Take 50 mg by mouth every 6 (six) hours as needed for severe pain or moderate pain.   Past Week   Vitamin D, Ergocalciferol, (DRISDOL) 1.25 MG (50000 UNIT) CAPS capsule Take 50,000 Units by mouth once a week.   Past Week   feeding supplement (ENSURE ENLIVE / ENSURE PLUS) LIQD Take 237 mLs by mouth 3 (three) times daily between meals. 237 mL 12    nutrition supplement, JUVEN, (JUVEN) PACK Take 1 packet by mouth 2 (two) times daily between meals. 60 each 0    Semaglutide,0.25 or 0.5MG /DOS, (OZEMPIC, 0.25 OR 0.5 MG/DOSE,) 2 MG/3ML SOPN Inject 0.25 mg into the skin once a week. After four weeks increase to 0.5 mg weekly, then after four weeks increase to 0.75 mg weekly. (Patient not  taking: Reported on 04/22/2022)   Not Taking    Assessment: Pharmacy consulted to dose heparin in this 52 year old female admitted with DKA, now with arterial occlusion of lower extremity, was on Eliquis PTA for VTE prophylaxis.   Pt received Eliquis 2.5 mg PO X 1 on 3/29 @ 1122.  CrCl = 66.4 ml/min  Goal of Therapy:  Heparin level 0.3-0.7 units/ml aPTT 66 - 102  seconds Monitor platelets by anticoagulation protocol: Yes   Plan:  Will start heparin infusion at 600 units/hr, no bolus b/c of prior Eliquis. Will use aPTT to guide dosing until correlating with HL Will check aPTT and HL 6 hrs after start of drip on 3/30 @ 0600.  CBC daily  Vanice Rappa D 04/24/2022,1:00 AM

## 2022-04-24 NOTE — Plan of Care (Signed)
  Problem: Coping: Goal: Ability to adjust to condition or change in health will improve Outcome: Progressing   Problem: Urinary Elimination: Goal: Ability to achieve and maintain adequate renal perfusion and functioning will improve Outcome: Progressing   Problem: Pain Managment: Goal: General experience of comfort will improve Outcome: Progressing

## 2022-04-24 NOTE — Progress Notes (Signed)
ANTICOAGULATION CONSULT NOTE - Initial Consult  Pharmacy Consult for Heparin  Indication: arterial occlusion right lower extremity  No Known Allergies  Patient Measurements: Height: 5\' 6"  (167.6 cm) Weight: 51.1 kg (112 lb 10.5 oz) IBW/kg (Calculated) : 59.3 Heparin Dosing Weight: 49.8 kg   Vital Signs: Temp: 98.8 F (37.1 C) (03/30 0348) Temp Source: Oral (03/30 0348) BP: 161/86 (03/30 0348) Pulse Rate: 78 (03/30 0348)  Labs: Recent Labs    04/22/22 0448 04/22/22 0837 04/22/22 1210 04/23/22 0731 04/23/22 2352 04/24/22 0405  HGB 9.7*  --   --  9.1*  --  9.2*  HCT 30.4*  --   --  28.8*  --  29.3*  PLT 301  --   --  240  --  189  APTT  --   --   --   --  31 37*  HEPARINUNFRC  --   --   --   --  >1.10* 0.84*  CREATININE 0.99   < > 0.56 0.44  --  <0.30*   < > = values in this interval not displayed.     CrCl cannot be calculated (This lab value cannot be used to calculate CrCl because it is not a number: <0.30).   Medical History: Past Medical History:  Diagnosis Date   Coronary artery disease    Diabetes mellitus without complication ([place])    Diabetes mellitus, type 2 (HCC)    HFrEF (heart failure with reduced ejection fraction) ([place])    Hyperlipemia    Hypertension    Ischemic cardiomyopathy    Orthostatic hypotension    PAD (peripheral artery disease) ([facility])    Tobacco use     Medications:  Medications Prior to Admission  Medication Sig Dispense Refill Last Dose   acetaminophen (TYLENOL) 650 MG CR tablet Take 650 mg by mouth every 8 (eight) hours as needed for pain.   Past Week   albuterol (VENTOLIN HFA) 108 (90 Base) MCG/ACT inhaler Inhale 2 puffs into the lungs every 6 (six) hours as needed for wheezing or shortness of breath.   Past Week   aspirin EC 81 MG tablet Take 81 mg by mouth daily. Swallow whole.   Past Week   atorvastatin (LIPITOR) 20 MG tablet Take 1 tablet (20 mg total) by mouth daily. 30 tablet 0 Past Week   barrier cream (NON-SPECIFIED)  CREA Apply 1 Application topically 2 (two) times daily as needed.   Past Week   carvedilol (COREG) 3.125 MG tablet Take 3.125 mg by mouth 2 (two) times daily with a meal.   Past Week   dapagliflozin propanediol (FARXIGA) 10 MG TABS tablet TAKE 1 TABLET BY MOUTH EVERY DAY BEFORE BREAKFAST (Patient taking differently: Take 10 mg by mouth daily before breakfast.) 90 tablet 0 Past Week   ELIQUIS 5 MG TABS tablet TAKE 1 TABLET BY MOUTH TWICE A DAY 60 tablet 3 Past Week   hydrochlorothiazide (HYDRODIURIL) 25 MG tablet Take 25 mg by mouth daily.   Past Week   ibuprofen (ADVIL) 600 MG tablet Take 600 mg by mouth every 6 (six) hours as needed for mild pain.   Past Week   insulin glargine (LANTUS SOLOSTAR) 100 UNIT/ML Solostar Pen Inject 25 Units into the skin daily.   Past Week   Multiple Vitamins-Minerals (CENTRUM SILVER 50+WOMEN PO) Take 1 tablet by mouth daily.   Past Week   nystatin cream (MYCOSTATIN) Apply 1 Application topically 2 (two) times daily.   Past Week   pantoprazole (PROTONIX) 40  MG tablet Take 1 tablet (40 mg total) by mouth daily. 30 tablet 0 Past Week   tiotropium (SPIRIVA) 18 MCG inhalation capsule Place 1 capsule into inhaler and inhale daily.   Past Week   traMADol (ULTRAM) 50 MG tablet Take 50 mg by mouth every 6 (six) hours as needed for severe pain or moderate pain.   Past Week   Vitamin D, Ergocalciferol, (DRISDOL) 1.25 MG (50000 UNIT) CAPS capsule Take 50,000 Units by mouth once a week.   Past Week   feeding supplement (ENSURE ENLIVE / ENSURE PLUS) LIQD Take 237 mLs by mouth 3 (three) times daily between meals. 237 mL 12    nutrition supplement, JUVEN, (JUVEN) PACK Take 1 packet by mouth 2 (two) times daily between meals. 60 each 0    Semaglutide,0.25 or 0.5MG /DOS, (OZEMPIC, 0.25 OR 0.5 MG/DOSE,) 2 MG/3ML SOPN Inject 0.25 mg into the skin once a week. After four weeks increase to 0.5 mg weekly, then after four weeks increase to 0.75 mg weekly. (Patient not taking: Reported on  04/22/2022)   Not Taking    Assessment: Pharmacy consulted to dose heparin in this 52 year old female admitted with DKA, now with arterial occlusion of lower extremity, was on Eliquis PTA for VTE prophylaxis.   Pt received Eliquis 2.5 mg PO X 1 on 3/29 @ 1122.  CrCl = 66.4 ml/min  Goal of Therapy:  Heparin level 0.3-0.7 units/ml aPTT 66 - 102  seconds Monitor platelets by anticoagulation protocol: Yes   Plan:  3/30 @ 0405: aPTT = 37,  HL = 0.84 - Will order heparin 1500 units IV X 1 bolus and increase drip rate to 800 units/hr. - Will recheck aPTT and HL 6 hrs after rate change - Will use aPTT to guide dosing once correlating with HL> CBC daily  Kohei Antonellis D 04/24/2022,6:17 AM

## 2022-04-24 NOTE — Consult Note (Signed)
Reason for Consult: Occluded right SFA, recurrent, with mild right lower extremity discomfort Referring Physician: Dr. Sharmon Beth Carroll is an 52 y.o. female.  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  She is only 52 years of age, was admitted with DKA.  She has a fairly poorly controlled diabetes.  She had AKI on admission as well with baseline elevation of her creatinine, and has the usual risk factors for peripheral disease as well including hyperlipidemia, hypertension, smoking history that is heavy, and the previously mentioned diabetes.  She appears to be somewhat wheelchair-bound, with sacral wounds and heel wounds.  Peripheral history includes on the left and intervention done in November 2022, but the right side is a side effect.  She did not have problems upon admission, was admitted to the unit with sepsis and hypotension, did not require pressor.  She did well with hydration and antibiotics and was transferred to the floor.  She noticed at the turn of the shift at roughly 730 or 8 yesterday that her right foot was more painful with some violaceous discoloration.  She still has motor and sensory function similar to before.  Arterial duplex was 1 done demonstrating right SFA occlusion within the previously placed stent.  Pertinent to the right side was an intervention done initially in December 2022, [place], [place] and popliteal DEB, Viabahn stents in the popliteal and pop.  She did well for roughly 6 months, then required lysis of the right lower extremity, with recurrent right SFA popliteal and posterior tibial CAT 6 thrombectomy, balloon angioplasty of the SFA, popliteal and tibial, placement of another right popliteal Viabahn stent and a stent in the SFA just proximal to the previously placed stent.  She appeared at that time to have posterior tibial and anterior tibial runoff.  She is on Eliquis for atrial  fibrillation.  I am seeing her in this context  Past Medical History:  Diagnosis Date   Coronary artery disease    Diabetes mellitus without complication ([place])    Diabetes mellitus, type 2 ([place])    HFrEF (heart failure with reduced ejection fraction) ([place])    Hyperlipemia    Hypertension    Ischemic cardiomyopathy    Orthostatic hypotension    PAD (peripheral artery disease) ([place])    Tobacco use     Past Surgical History:  Procedure Laterality Date   CORONARY STENT INTERVENTION N/A 12/15/2020   Procedure: CORONARY STENT INTERVENTION;  Surgeon: Wellington Hampshire, MD;  Location: [facility];  Service: Cardiovascular;  Laterality: N/A;   LEFT HEART CATH AND CORONARY ANGIOGRAPHY N/A 12/15/2020   Procedure: LEFT HEART CATH AND CORONARY ANGIOGRAPHY;  Surgeon: Wellington Hampshire, MD;  Location: [facility];  Service: Cardiovascular;  Laterality: N/A;   LOWER EXTREMITY ANGIOGRAPHY Left 12/10/2020   Procedure: Lower Extremity Angiography;  Surgeon: Algernon Huxley, MD;  Location: [facility];  Service: Cardiovascular;  Laterality: Left;   LOWER EXTREMITY ANGIOGRAPHY Right 12/25/2020   Procedure: LOWER EXTREMITY ANGIOGRAPHY;  Surgeon: Algernon Huxley, MD;  Location: [facility];  Service: Cardiovascular;  Laterality: Right;   LOWER EXTREMITY ANGIOGRAPHY Right 08/19/2021   Procedure: Lower Extremity Angiography;  Surgeon: Algernon Huxley, MD;  Location: [facility];  Service: Cardiovascular;  Laterality: Right;   LOWER EXTREMITY ANGIOGRAPHY Right 08/19/2021   Procedure: Lower Extremity Angiography;  Surgeon: Algernon Huxley, MD;  Location:  [facility];  Service: Cardiovascular;  Laterality: Right;   ORIF FEMUR FRACTURE Right 12/14/2021   Procedure: OPEN REDUCTION INTERNAL FIXATION RIGHT DISTAL FEMUR;  Surgeon: Shona Needles, MD;  Location: [facility];  Service: Orthopedics;  Laterality: Right;    Family History  Problem Relation Age of Onset   Hypertension  Father     Social History:  reports that she quit smoking about 16 months ago. Her smoking use included cigarettes. She has never used smokeless tobacco. She reports that she does not drink alcohol. No history on file for drug use.  Allergies: No Known Allergies  Medications: I have reviewed the patient's current medications.  Results for orders placed or performed during the hospital encounter of 04/21/22 (from the past 48 hour(s))  CBG monitoring, ED     Status: Abnormal   Collection Time: 04/22/22 12:16 AM  Result Value Ref Range   Glucose-Capillary 471 (H) 70 - 99 mg/dL    Comment: Glucose reference range applies only to samples taken after fasting for at least 8 hours.  Glucose, capillary     Status: Abnormal   Collection Time: 04/22/22 12:57 AM  Result Value Ref Range   Glucose-Capillary 463 (H) 70 - 99 mg/dL    Comment: Glucose reference range applies only to samples taken after fasting for at least 8 hours.  Basic metabolic panel     Status: Abnormal   Collection Time: 04/22/22  1:26 AM  Result Value Ref Range   Sodium 135 135 - 145 mmol/L    Comment: ELECTROLYTES REPEATED TO VERIFY RH/SKL   Potassium 3.7 3.5 - 5.1 mmol/L   Chloride 100 98 - 111 mmol/L   CO2 11 (L) 22 - 32 mmol/L   Glucose, Bld 488 (H) 70 - 99 mg/dL    Comment: Glucose reference range applies only to samples taken after fasting for at least 8 hours.   BUN 62 (H) 6 - 20 mg/dL   Creatinine, Ser 1.21 (H) 0.44 - 1.00 mg/dL   Calcium 8.5 (L) 8.9 - 10.3 mg/dL   GFR, Estimated 54 (L) >60 mL/min    Comment: (NOTE) Calculated using the CKD-EPI Creatinine Equation (2021)    Anion gap 24 (H) 5 - 15    Comment: Performed at [facility], [address] [place], [place] [address]  Glucose, capillary     Status: Abnormal   Collection Time: 04/22/22  1:31 AM  Result Value Ref Range   Glucose-Capillary 469 (H) 70 - 99 mg/dL    Comment: Glucose reference range applies only to samples taken after fasting  for at least 8 hours.  Glucose, capillary     Status: Abnormal   Collection Time: 04/22/22  2:03 AM  Result Value Ref Range   Glucose-Capillary 380 (H) 70 - 99 mg/dL    Comment: Glucose reference range applies only to samples taken after fasting for at least 8 hours.  C Difficile Quick Screen w PCR reflex     Status: None   Collection Time: 04/22/22  2:30 AM   Specimen: STOOL  Result Value Ref Range   C Diff antigen NEGATIVE NEGATIVE   C Diff toxin NEGATIVE NEGATIVE   C Diff interpretation No C. difficile detected.     Comment: Performed at [facility], [address] [place], [place] [address]  Gastrointestinal Panel by PCR , Stool     Status: None   Collection Time: 04/22/22  2:30 AM   Specimen: STOOL  Result Value  Ref Range   Campylobacter species NOT DETECTED NOT DETECTED   Plesimonas shigelloides NOT DETECTED NOT DETECTED   Salmonella species NOT DETECTED NOT DETECTED   Yersinia enterocolitica NOT DETECTED NOT DETECTED   Vibrio species NOT DETECTED NOT DETECTED   Vibrio cholerae NOT DETECTED NOT DETECTED   Enteroaggregative E coli (EAEC) NOT DETECTED NOT DETECTED   Enteropathogenic E coli (EPEC) NOT DETECTED NOT DETECTED   Enterotoxigenic E coli (ETEC) NOT DETECTED NOT DETECTED   Shiga like toxin producing E coli (STEC) NOT DETECTED NOT DETECTED   Shigella/Enteroinvasive E coli (EIEC) NOT DETECTED NOT DETECTED   Cryptosporidium NOT DETECTED NOT DETECTED   Cyclospora cayetanensis NOT DETECTED NOT DETECTED   Entamoeba histolytica NOT DETECTED NOT DETECTED   Giardia lamblia NOT DETECTED NOT DETECTED   Adenovirus F40/41 NOT DETECTED NOT DETECTED   Astrovirus NOT DETECTED NOT DETECTED   Norovirus GI/GII NOT DETECTED NOT DETECTED   Rotavirus A NOT DETECTED NOT DETECTED   Sapovirus (I, II, IV, and V) NOT DETECTED NOT DETECTED    Comment: Performed at [facility], [address] [place], [place] [address]  Glucose, capillary     Status: Abnormal    Collection Time: 04/22/22  3:02 AM  Result Value Ref Range   Glucose-Capillary 351 (H) 70 - 99 mg/dL    Comment: Glucose reference range applies only to samples taken after fasting for at least 8 hours.  Glucose, capillary     Status: Abnormal   Collection Time: 04/22/22  4:08 AM  Result Value Ref Range   Glucose-Capillary 288 (H) 70 - 99 mg/dL    Comment: Glucose reference range applies only to samples taken after fasting for at least 8 hours.  Basic metabolic panel     Status: Abnormal   Collection Time: 04/22/22  4:48 AM  Result Value Ref Range   Sodium 138 135 - 145 mmol/L   Potassium 3.1 (L) 3.5 - 5.1 mmol/L   Chloride 105 98 - 111 mmol/L   CO2 18 (L) 22 - 32 mmol/L   Glucose, Bld 303 (H) 70 - 99 mg/dL    Comment: Glucose reference range applies only to samples taken after fasting for at least 8 hours.   BUN 58 (H) 6 - 20 mg/dL   Creatinine, Ser 0.99 0.44 - 1.00 mg/dL   Calcium 9.2 8.9 - 10.3 mg/dL   GFR, Estimated >60 >60 mL/min    Comment: (NOTE) Calculated using the CKD-EPI Creatinine Equation (2021)    Anion gap 15 5 - 15    Comment: Performed at [facility], [address] [place], [place] [address]  Beta-hydroxybutyric acid     Status: Abnormal   Collection Time: 04/22/22  4:48 AM  Result Value Ref Range   Beta-Hydroxybutyric Acid 3.74 (H) 0.05 - 0.27 mmol/L    Comment: Performed at [facility], [address] [place], [place] [address]  CBC     Status: Abnormal   Collection Time: 04/22/22  4:48 AM  Result Value Ref Range   WBC 19.6 (H) 4.0 - 10.5 K/uL   RBC 3.56 (L) 3.87 - 5.11 MIL/uL   Hemoglobin 9.7 (L) 12.0 - 15.0 g/dL   HCT 30.4 (L) 36.0 - 46.0 %   MCV 85.4 80.0 - 100.0 fL   MCH 27.2 26.0 - 34.0 pg   MCHC 31.9 30.0 - 36.0 g/dL   RDW 15.3 11.5 - 15.5 %   Platelets 301 150 - 400 K/uL   nRBC 0.0 0.0 -  0.2 %    Comment: Performed at [facility], [address] [place], [place] [address]  Magnesium     Status: None    Collection Time: 04/22/22  4:48 AM  Result Value Ref Range   Magnesium 1.7 1.7 - 2.4 mg/dL    Comment: Performed at [facility], [address] [place], [place] [address]  Phosphorus     Status: Abnormal   Collection Time: 04/22/22  4:48 AM  Result Value Ref Range   Phosphorus 2.0 (L) 2.5 - 4.6 mg/dL    Comment: Performed at [facility], [place], [place] [address]  Lactic acid, plasma     Status: None   Collection Time: 04/22/22  4:48 AM  Result Value Ref Range   Lactic Acid, Venous 1.9 0.5 - 1.9 mmol/L    Comment: Performed at [facility], [address] [place], [place] [address]  Glucose, capillary     Status: Abnormal   Collection Time: 04/22/22  5:05 AM  Result Value Ref Range   Glucose-Capillary 268 (H) 70 - 99 mg/dL    Comment: Glucose reference range applies only to samples taken after fasting for at least 8 hours.  Glucose, capillary     Status: Abnormal   Collection Time: 04/22/22  5:56 AM  Result Value Ref Range   Glucose-Capillary 245 (H) 70 - 99 mg/dL    Comment: Glucose reference range applies only to samples taken after fasting for at least 8 hours.  Glucose, capillary     Status: Abnormal   Collection Time: 04/22/22  7:07 AM  Result Value Ref Range   Glucose-Capillary 196 (H) 70 - 99 mg/dL    Comment: Glucose reference range applies only to samples taken after fasting for at least 8 hours.  Glucose, capillary     Status: Abnormal   Collection Time: 04/22/22  8:02 AM  Result Value Ref Range   Glucose-Capillary 165 (H) 70 - 99 mg/dL    Comment: Glucose reference range applies only to samples taken after fasting for at least 8 hours.  Basic metabolic panel     Status: Abnormal   Collection Time: 04/22/22  8:37 AM  Result Value Ref Range   Sodium 138 135 - 145 mmol/L   Potassium 3.7 3.5 - 5.1 mmol/L   Chloride 105 98 - 111 mmol/L   CO2 24 22 - 32 mmol/L   Glucose, Bld 190 (H) 70 - 99 mg/dL    Comment: Glucose  reference range applies only to samples taken after fasting for at least 8 hours.   BUN 49 (H) 6 - 20 mg/dL   Creatinine, Ser 0.70 0.44 - 1.00 mg/dL   Calcium 9.4 8.9 - 10.3 mg/dL   GFR, Estimated >60 >60 mL/min    Comment: (NOTE) Calculated using the CKD-EPI Creatinine Equation (2021)    Anion gap 9 5 - 15    Comment: Performed at [facility], [address] [place],  [address]  Glucose, capillary     Status: Abnormal   Collection Time: 04/22/22  9:11 AM  Result Value Ref Range   Glucose-Capillary 171 (H) 70 - 99 mg/dL    Comment: Glucose reference range applies only to samples taken after fasting for at least 8 hours.  Glucose, capillary     Status: Abnormal   Collection Time: 04/22/22 10:18 AM  Result Value Ref Range   Glucose-Capillary 173 (H) 70 - 99 mg/dL    Comment: Glucose reference  range applies only to samples taken after fasting for at least 8 hours.  Glucose, capillary     Status: Abnormal   Collection Time: 04/22/22 12:02 PM  Result Value Ref Range   Glucose-Capillary 158 (H) 70 - 99 mg/dL    Comment: Glucose reference range applies only to samples taken after fasting for at least 8 hours.  Basic metabolic panel     Status: Abnormal   Collection Time: 04/22/22 12:10 PM  Result Value Ref Range   Sodium 139 135 - 145 mmol/L   Potassium 4.2 3.5 - 5.1 mmol/L   Chloride 105 98 - 111 mmol/L   CO2 26 22 - 32 mmol/L   Glucose, Bld 183 (H) 70 - 99 mg/dL    Comment: Glucose reference range applies only to samples taken after fasting for at least 8 hours.   BUN 45 (H) 6 - 20 mg/dL   Creatinine, Ser 0.56 0.44 - 1.00 mg/dL   Calcium 9.2 8.9 - 10.3 mg/dL   GFR, Estimated >60 >60 mL/min    Comment: (NOTE) Calculated using the CKD-EPI Creatinine Equation (2021)    Anion gap 8 5 - 15    Comment: Performed at [facility], [address] [place], [place] [address]  Beta-hydroxybutyric acid     Status: None   Collection Time: 04/22/22 12:10 PM   Result Value Ref Range   Beta-Hydroxybutyric Acid 0.09 0.05 - 0.27 mmol/L    Comment: Performed at [facility], [address] [place], [place] [address]  Glucose, capillary     Status: Abnormal   Collection Time: 04/22/22  4:18 PM  Result Value Ref Range   Glucose-Capillary 184 (H) 70 - 99 mg/dL    Comment: Glucose reference range applies only to samples taken after fasting for at least 8 hours.  Glucose, capillary     Status: Abnormal   Collection Time: 04/22/22  7:31 PM  Result Value Ref Range   Glucose-Capillary 170 (H) 70 - 99 mg/dL    Comment: Glucose reference range applies only to samples taken after fasting for at least 8 hours.  Glucose, capillary     Status: Abnormal   Collection Time: 04/22/22 11:09 PM  Result Value Ref Range   Glucose-Capillary 157 (H) 70 - 99 mg/dL    Comment: Glucose reference range applies only to samples taken after fasting for at least 8 hours.  Glucose, capillary     Status: Abnormal   Collection Time: 04/23/22  3:08 AM  Result Value Ref Range   Glucose-Capillary 148 (H) 70 - 99 mg/dL    Comment: Glucose reference range applies only to samples taken after fasting for at least 8 hours.  Glucose, capillary     Status: Abnormal   Collection Time: 04/23/22  7:22 AM  Result Value Ref Range   Glucose-Capillary 145 (H) 70 - 99 mg/dL    Comment: Glucose reference range applies only to samples taken after fasting for at least 8 hours.  Basic metabolic panel     Status: Abnormal   Collection Time: 04/23/22  7:31 AM  Result Value Ref Range   Sodium 141 135 - 145 mmol/L   Potassium 3.9 3.5 - 5.1 mmol/L   Chloride 109 98 - 111 mmol/L   CO2 24 22 - 32 mmol/L   Glucose, Bld 176 (H) 70 - 99 mg/dL    Comment: Glucose reference range applies only to samples taken after fasting for at least 8 hours.   BUN 28 (H)  6 - 20 mg/dL   Creatinine, Ser 0.44 0.44 - 1.00 mg/dL   Calcium 9.2 8.9 - 10.3 mg/dL   GFR, Estimated >60 >60 mL/min    Comment:  (NOTE) Calculated using the CKD-EPI Creatinine Equation (2021)    Anion gap 8 5 - 15    Comment: Performed at [facility], [address] [place], [place] [address]  CBC     Status: Abnormal   Collection Time: 04/23/22  7:31 AM  Result Value Ref Range   WBC 8.7 4.0 - 10.5 K/uL   RBC 3.41 (L) 3.87 - 5.11 MIL/uL   Hemoglobin 9.1 (L) 12.0 - 15.0 g/dL   HCT 28.8 (L) 36.0 - 46.0 %   MCV 84.5 80.0 - 100.0 fL   MCH 26.7 26.0 - 34.0 pg   MCHC 31.6 30.0 - 36.0 g/dL   RDW 16.6 (H) 11.5 - 15.5 %   Platelets 240 150 - 400 K/uL   nRBC 0.0 0.0 - 0.2 %    Comment: Performed at [facility], [address] [place], [place] [address]  Magnesium     Status: None   Collection Time: 04/23/22  7:31 AM  Result Value Ref Range   Magnesium 2.2 1.7 - 2.4 mg/dL    Comment: Performed at [facility], [address] [place], [place] [address]  Phosphorus     Status: Abnormal   Collection Time: 04/23/22  7:31 AM  Result Value Ref Range   Phosphorus 1.9 (L) 2.5 - 4.6 mg/dL    Comment: Performed at [facility], [address] [place], [place] [address]  Glucose, capillary     Status: Abnormal   Collection Time: 04/23/22  9:27 AM  Result Value Ref Range   Glucose-Capillary 161 (H) 70 - 99 mg/dL    Comment: Glucose reference range applies only to samples taken after fasting for at least 8 hours.   Comment 1 Notify RN    Comment 2 Document in Chart   Glucose, capillary     Status: Abnormal   Collection Time: 04/23/22 11:42 AM  Result Value Ref Range   Glucose-Capillary 147 (H) 70 - 99 mg/dL    Comment: Glucose reference range applies only to samples taken after fasting for at least 8 hours.   Comment 1 Notify RN    Comment 2 Document in Chart   Glucose, capillary     Status: Abnormal   Collection Time: 04/23/22  4:18 PM  Result Value Ref Range   Glucose-Capillary 184 (H) 70 - 99 mg/dL    Comment: Glucose reference range applies only to samples taken after  fasting for at least 8 hours.   Comment 1 Notify RN    Comment 2 Document in Chart   Glucose, capillary     Status: Abnormal   Collection Time: 04/23/22  9:09 PM  Result Value Ref Range   Glucose-Capillary 133 (H) 70 - 99 mg/dL    Comment: Glucose reference range applies only to samples taken after fasting for at least 8 hours.  Lactic acid, plasma     Status: None   Collection Time: 04/23/22 10:21 PM  Result Value Ref Range   Lactic Acid, Venous 1.0 0.5 - 1.9 mmol/L    Comment: Performed at [facility], [address] [place],  [address]    US ARTERIAL LOWER EXTREMITY DUPLEX RIGHT(NON-ABI)  Result Date: 04/23/2022 CLINICAL DATA:  Right leg and foot pain, no distal pulses EXAM: RIGHT LOWER EXTREMITY ARTERIAL  DUPLEX SCAN TECHNIQUE: Gray-scale sonography as well as color Doppler and duplex ultrasound was performed to evaluate the lower extremity arteries including the common, superficial and profunda femoral arteries, popliteal artery and calf arteries. COMPARISON:  Ultrasound examination from 02/03/2023 as well as prior arteriogram from 08/19/2021. FINDINGS: Right lower Extremity ABI: Not obtained Inflow: Common femoral artery demonstrates extensive atherosclerotic plaque without focal stenosis. Monophasic waveforms are noted. This is similar to that seen on prior arteriography. Outflow: A stent is identified which extends from the proximal superficial femoral artery into the popliteal artery. The stent is thrombosed at its origin and throughout its course. Profunda femoral artery shows monophasic waveform. Runoff: Anterior tibial artery demonstrates monophasic waveform with decreased velocities consistent with the more proximal stent occlusion. Posterior tibial artery demonstrates monophasic waveform also related to the proximal stent occlusion. These are likely being fed by muscular collaterals. posterior and anterior tibial arterial waveforms and velocities. Vessels are patent  to the ankle. IMPRESSION: Occluded femoral and popliteal arterial stent with distal monophasic waveforms consistent with the lack of significant inflow. Electronically Signed   By: Inez Catalina M.D.   On: 04/23/2022 23:16   CT ABDOMEN PELVIS WO CONTRAST  Result Date: 04/22/2022 CLINICAL DATA:  Sepsis EXAM: CT ABDOMEN AND PELVIS WITHOUT CONTRAST TECHNIQUE: Multidetector CT imaging of the abdomen and pelvis was performed following the standard protocol without IV contrast. RADIATION DOSE REDUCTION: This exam was performed according to the departmental dose-optimization program which includes automated exposure control, adjustment of the mA and/or kV according to patient size and/or use of iterative reconstruction technique. COMPARISON:  None Available. FINDINGS: Lower chest: Lung bases are clear. Old rib fractures. Dilated fluid-filled distal esophagus suggesting reflux or dysmotility. No obstructing mass lesion is identified. Hepatobiliary: No focal liver abnormality is seen. Status post cholecystectomy. No biliary dilatation. Pancreas: Unremarkable. No pancreatic ductal dilatation or surrounding inflammatory changes. Spleen: Normal in size without focal abnormality. Adrenals/Urinary Tract: No adrenal gland nodules. Central calcifications in both kidneys consistent with vascular calcifications. No renal, ureteral, or bladder stones. No hydronephrosis or hydroureter. Bladder wall is diffusely thickened, possibly indicating outlet obstruction or cystitis. Correlate with urinalysis. Stomach/Bowel: Stomach is fluid-filled without wall thickening, likely physiologic. Small bowel and colon are mostly decompressed. No wall thickening or inflammatory changes are appreciated. Prominent stool in the rectum with some rectal wall thickening may indicate stercoral colitis. No mesenteric collections are identified. Vascular/Lymphatic: Aortic atherosclerosis. No enlarged abdominal or pelvic lymph nodes. Reproductive: Uterus and  bilateral adnexa are unremarkable. Other: No free air or free fluid in the abdomen. Abdominal wall musculature appears intact. Gas bubbles in the anterior abdominal wall soft tissues likely represent injection sites. Diffuse subcutaneous soft tissue edema. Musculoskeletal: No acute bony abnormalities. IMPRESSION: 1. No loculated collection to suggest abscess and no inflammatory changes are appreciated. 2. Fluid-filled lower esophagus and stomach likely representing reflux disease or dysmotility. 3. Aortic atherosclerosis. Electronically Signed   By: Lucienne Capers M.D.   On: 04/22/2022 01:06    Review of Systems Blood pressure 125/74, pulse 81, temperature 99.2 F (37.3 C), temperature source Oral, resp. rate 20, height 5\' 6"  (1.676 m), weight 51.1 kg, last menstrual period 01/26/2015, SpO2 98 %. Physical Exam  This patient is awake, alert, oriented.  She is very pleasant.  She does not manifest significant baseline discomfort to the right lower extremity.  She had a single dose of pain medicine over an hour ago.  Respiratory effort is easy and nonlabored.  Skin color and turgor is  good, no jaundice, not pale.  Lower extremities appear warm and well-perfused symmetrically to the point of the ankle where it gets cooler on the right.  She has capillary refill bilaterally.  She does have some violaceous discoloration of the right foot.  She has equal motor and sensory function bilaterally.  She has dressing on the right heel as she had a heel ulcer previously that has been slow to heal.  Her husband says that she was walking up until about 3 weeks ago when she hurt both heels, unclear the mechanical trauma.   Assessment/Plan: 1.  Recurrent right SFA and popliteal occlusion with collateral flow 2.  Recent DKA with sepsis, and hypotension with dehydration 3.  Severe PAD with recurrent interventions required for limb salvage 4.  Somewhat limited ambulatory status with sacral wound and heel wounds  bilaterally  At this point in time we will initiate heparin drip, given the lack of frank ischemia with no motor or sensory issue, and plan for percutaneous intervention to reestablish patency in the morning to avoid aspiration issues.  I discussed this at length with she and her husband with her daughter present as well, and medical staff caring for her overnight.  All questions were answered to their satisfaction.  I did emphasize that she has severe PAD with limited patency rates and this may eventually not be recoverable as far as circulation distally goes.  If her activity level truly is quite limited, the risk reward for aggressive intervention would go down in the future somewhat, if the limb is not truly functional.  Zara Chess 04/24/2022, 12:14 AM

## 2022-04-24 NOTE — Progress Notes (Signed)
OT Cancellation Note  Patient Details Name: Beth Carroll MRN: [id number] DOB: August 30, 1970   Cancelled Treatment:    Reason Eval/Treat Not Completed: Patient at procedure or test/ unavailable. OT orders received, chart reviewed. Pt currently off the floor for procedure. Will re-attempt OT evaluation as able and pt medically appropriate.   Doneta Public 04/24/2022, 8:17 AM

## 2022-04-24 NOTE — Progress Notes (Signed)
Patient clinically stable post right leg angiogram with intervention per Dr Shelia Media, tolerated well. Vitals stable pre and post procedure. Denies complaints post procedure. Family members at bedside with full update given per Dr Shelia Media post procedure. Right foot now warm to touch with pink areas where before/cold/ pale. Report given to [facility] 221 post procedure. Received Versed 3 mg along with Fentanyl 75 mcg IV for procedure. Heparin gtt restarted at this time per orders.

## 2022-04-24 NOTE — Progress Notes (Signed)
ANTICOAGULATION CONSULT NOTE -   Pharmacy Consult for Heparin  Indication: arterial occlusion right lower extremity  No Known Allergies  Patient Measurements: Height: 5\' 6"  (167.6 cm) Weight: 55.7 kg (122 lb 12.7 oz) IBW/kg (Calculated) : 59.3 Heparin Dosing Weight: 49.8 kg   Vital Signs: Temp: 98.2 F (36.8 C) (03/30 2005) Temp Source: Oral (03/30 2005) BP: 122/67 (03/30 2005) Pulse Rate: 85 (03/30 2005)  Labs: Recent Labs    04/22/22 0448 04/22/22 0837 04/22/22 1210 04/23/22 0731 04/23/22 2352 04/23/22 2352 04/24/22 0405 04/24/22 1253 04/24/22 2022  HGB 9.7*  --   --  9.1*  --   --  9.2*  --   --   HCT 30.4*  --   --  28.8*  --   --  29.3*  --   --   PLT 301  --   --  240  --   --  189  --   --   APTT  --   --   --   --  31   < > 37* 121* 44*  HEPARINUNFRC  --   --   --   --  >1.10*  --  0.84* 0.84*  --   CREATININE 0.99   < > 0.56 0.44  --   --  <0.30*  --   --    < > = values in this interval not displayed.     CrCl cannot be calculated (This lab value cannot be used to calculate CrCl because it is not a number: <0.30).   Medical History: Past Medical History:  Diagnosis Date   Coronary artery disease    Diabetes mellitus without complication ([place])    Diabetes mellitus, type 2 (HCC)    HFrEF (heart failure with reduced ejection fraction) ([place])    Hyperlipemia    Hypertension    Ischemic cardiomyopathy    Orthostatic hypotension    PAD (peripheral artery disease) (HCC)    Tobacco use     Medications:  Medications Prior to Admission  Medication Sig Dispense Refill Last Dose   acetaminophen (TYLENOL) 650 MG CR tablet Take 650 mg by mouth every 8 (eight) hours as needed for pain.   Past Week   albuterol (VENTOLIN HFA) 108 (90 Base) MCG/ACT inhaler Inhale 2 puffs into the lungs every 6 (six) hours as needed for wheezing or shortness of breath.   Past Week   aspirin EC 81 MG tablet Take 81 mg by mouth daily. Swallow whole.   Past Week   atorvastatin  (LIPITOR) 20 MG tablet Take 1 tablet (20 mg total) by mouth daily. 30 tablet 0 Past Week   barrier cream (NON-SPECIFIED) CREA Apply 1 Application topically 2 (two) times daily as needed.   Past Week   carvedilol (COREG) 3.125 MG tablet Take 3.125 mg by mouth 2 (two) times daily with a meal.   Past Week   dapagliflozin propanediol (FARXIGA) 10 MG TABS tablet TAKE 1 TABLET BY MOUTH EVERY DAY BEFORE BREAKFAST (Patient taking differently: Take 10 mg by mouth daily before breakfast.) 90 tablet 0 Past Week   ELIQUIS 5 MG TABS tablet TAKE 1 TABLET BY MOUTH TWICE A DAY 60 tablet 3 Past Week   hydrochlorothiazide (HYDRODIURIL) 25 MG tablet Take 25 mg by mouth daily.   Past Week   ibuprofen (ADVIL) 600 MG tablet Take 600 mg by mouth every 6 (six) hours as needed for mild pain.   Past Week   insulin glargine (LANTUS SOLOSTAR) 100  UNIT/ML Solostar Pen Inject 25 Units into the skin daily.   Past Week   Multiple Vitamins-Minerals (CENTRUM SILVER 50+WOMEN PO) Take 1 tablet by mouth daily.   Past Week   nystatin cream (MYCOSTATIN) Apply 1 Application topically 2 (two) times daily.   Past Week   pantoprazole (PROTONIX) 40 MG tablet Take 1 tablet (40 mg total) by mouth daily. 30 tablet 0 Past Week   tiotropium (SPIRIVA) 18 MCG inhalation capsule Place 1 capsule into inhaler and inhale daily.   Past Week   traMADol (ULTRAM) 50 MG tablet Take 50 mg by mouth every 6 (six) hours as needed for severe pain or moderate pain.   Past Week   Vitamin D, Ergocalciferol, (DRISDOL) 1.25 MG (50000 UNIT) CAPS capsule Take 50,000 Units by mouth once a week.   Past Week   feeding supplement (ENSURE ENLIVE / ENSURE PLUS) LIQD Take 237 mLs by mouth 3 (three) times daily between meals. 237 mL 12    nutrition supplement, JUVEN, (JUVEN) PACK Take 1 packet by mouth 2 (two) times daily between meals. 60 each 0    Semaglutide,0.25 or 0.5MG /DOS, (OZEMPIC, 0.25 OR 0.5 MG/DOSE,) 2 MG/3ML SOPN Inject 0.25 mg into the skin once a week. After four  weeks increase to 0.5 mg weekly, then after four weeks increase to 0.75 mg weekly. (Patient not taking: Reported on 04/22/2022)   Not Taking    Assessment: Pharmacy consulted to dose heparin in this 52 year old female admitted with DKA, now with arterial occlusion of lower extremity, was on Eliquis PTA for VTE prophylaxis.   Pt received Eliquis 2.5 mg PO X 1 on 3/29 @ 1122.  CrCl = 66.4 ml/min  3/30 @ 0405: aPTT = 37,  HL = 0.84  inc from 600 to 800 u/hr 3/30 @1253  aPTT= 121,  HL= 0.84,   dec from 800 to 700 u/hr 3/30 @ 2022 aPTT = 44, subtherapeutic @ 700 units/hr  Goal of Therapy:  Heparin level 0.3-0.7 units/ml aPTT 66 - 102  seconds Monitor platelets by anticoagulation protocol: Yes   Plan:  - Give heparin 1500 units IV x 1 - Increase drip rate to 750 units/hr. - Will recheck aPTT 6 hrs after rate change - Will use aPTT to guide dosing until correlating with HL>CBC daily   Lorin Picket, PharmD 04/24/2022,9:11 PM

## 2022-04-24 NOTE — Progress Notes (Signed)
PROGRESS NOTE    Beth Carroll  [id number] DOB: 04-Dec-1970 DOA: 04/21/2022 PCP: Baxter Hire, MD  [id number]  LOS: 3 days   Brief hospital course:   Assessment & Plan: 52 year old female presenting to [facility] ED from home via EMS on 04/21/2022 for evaluation of weakness.   Tuesday (3/26) when she began having " continuous vomiting" and multiple episodes of diarrhea. She has not taken any insulin in the last 2 days due to the nausea vomiting and diarrhea.  She is currently receiving at home physical therapy in order to ambulate with a walker but is otherwise wheelchair-bound.   Admit to ICU with severe DKA and metabolic acidosis in the setting of suspected sepsis secondary to suspected UTI versus intra-abdominal infection with hypotension and potential need for vasopressor support.  Pt did not need pressor, and was transferred to [facility] on 04/23/22.  Diabetic ketoacidosis  Severe AGMA Insulin non-compliance PMHx: poorly controlled T2DM --off insulin gtt and transitioned to subQ insulin around 11 am on 3/28. --glargine on hold now --ACHS and SSI  Suspected Sepsis  Suspected UTI --leukocytosis, tachypnea.  Started on vanc/cefepime on presentation, and transitioned to ceftriaxone for UTI, however, no urine culture sent.  CXR showed mild Heterogeneous opacities in the left mid and lower lung, however, pt does not have symptoms of PNA. --pt continued to complained of dysuria even after 3 days of IV abx Plan: --cont ceftriaxone for now --send of urine cx  Nausea/ Vomiting/ Diarrhea --CT abdomen/pelvis wo contrast no acute finding. - GI panel PCR & C-diff both neg --cont MIVF@75    Acute Kidney Injury suspect pre-renal secondary to hypovolemia in the setting of DKA, vomiting/diarrhea Baseline Cr: 0.47, Cr on admission:1.54 --Cr back to baseline after IVF   Transaminitis, ruled out --alk phos 175, mildly elevated  Chronic ICM, HFrEF, CAD s/p PCI  --cont home ASA --home coreg  due to hypotension   COPD PMHx: former smoker --not in exacerbation --cont Spiriva   Chronic Wounds- sacrum & bilateral heels - consult WOC - utilize offloading devices, Turn Q 2 - optimize nutrition once stabilized  Hypophos --monitor and replete PRN  PAD with recurrent right superficial femoral, right common femoral, popliteal artery occlusion  --angiogram with intervention today --cont heparin gtt   DVT prophylaxis: [id number] gtt Code Status: Full code  Family Communication:  Level of care: Med-Surg Dispo:   The patient is from: home Anticipated d/c is to: home Anticipated d/c date is: 2-3 days   Subjective and Interval History:  Overnight, pt was found with "increases pain in rt foot, cool to the touch, no pulse", vascular consulted and took pt for angiogram and intervention today.  Tolerated it well.   Objective: Vitals:   04/24/22 1005 04/24/22 1015 04/24/22 1030 04/24/22 1612  BP: 132/78 136/74 122/77 101/66  Pulse: 84 81 78 88  Resp: 14 20 18 18   Temp:   98.2 F (36.8 C) 98.9 F (37.2 C)  TempSrc:   Oral Oral  SpO2: 96% 96% 93% 98%  Weight:      Height:        Intake/Output Summary (Last 24 hours) at 04/24/2022 1857 Last data filed at 04/24/2022 1428 Gross per 24 hour  Intake 586.77 ml  Output 400 ml  Net 186.77 ml   Filed Weights   04/22/22 0100 04/23/22 0300 04/24/22 0637  Weight: 49.8 kg 51.1 kg 55.7 kg    Examination:   Constitutional: NAD, AAOx3 HEENT: conjunctivae and lids normal, EOMI CV: No cyanosis.  RESP: normal respiratory effort, on RA Extremities: right toes purple and cool Neuro: II - XII grossly intact.   Psych: subdued mood and affect.     Data Reviewed: I have personally reviewed labs and imaging studies  Time spent: 35 minutes  Enzo Bi, MD [facility] If 7PM-7AM, please contact night-coverage 04/24/2022, 6:57 PM

## 2022-04-24 NOTE — Progress Notes (Signed)
Interval H&P:  Beth Carroll although not terribly uncomfortable still has some discomfort in the right lower extremity.  She is warm roughly to the level of the malleolus on the right, and somewhat cooler on the right compared to the left.  Duplex from yesterday shows thrombosis within her right SFA that is recurrent.  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  The various methods of treatment have been discussed with the patient and family. After consideration of risks, benefits and other options for treatment, the patient has consented to  Procedure(s): Lower Extremity Angiography (Right) as a surgical intervention.  The patient's history has been reviewed, patient examined, no change in status, stable for surgery.  I have reviewed the patient's chart and labs.  Questions were answered to the patient's satisfaction.

## 2022-04-25 DIAGNOSIS — E1110 Type 2 diabetes mellitus with ketoacidosis without coma: Secondary | ICD-10-CM | POA: Diagnosis not present

## 2022-04-25 LAB — BASIC METABOLIC PANEL
Anion gap: 10 (ref 5–15)
BUN: 11 mg/dL (ref 6–20)
CO2: 24 mmol/L (ref 22–32)
Calcium: 8.4 mg/dL — ABNORMAL LOW (ref 8.9–10.3)
Chloride: 104 mmol/L (ref 98–111)
Creatinine, Ser: <0.3 mg/dL — ABNORMAL LOW (ref 0.44–1.00)
Glucose, Bld: 158 mg/dL — ABNORMAL HIGH (ref 70–99)
Potassium: 3.3 mmol/L — ABNORMAL LOW (ref 3.5–5.1)
Sodium: 138 mmol/L (ref 135–145)

## 2022-04-25 LAB — URINALYSIS, W/ REFLEX TO CULTURE (INFECTION SUSPECTED)
Bacteria, UA: NONE SEEN
Bilirubin Urine: NEGATIVE
Glucose, UA: >=500 mg/dL — AB
Ketones, ur: 80 mg/dL — AB
Nitrite: NEGATIVE
Protein, ur: NEGATIVE mg/dL
RBC / HPF: >50 RBC/hpf (ref 0–5)
Specific Gravity, Urine: 1.028 (ref 1.005–1.030)
WBC, UA: >50 WBC/hpf (ref 0–5)
pH: 6 (ref 5.0–8.0)

## 2022-04-25 LAB — CBC
HCT: 26.6 % — ABNORMAL LOW (ref 36.0–46.0)
Hemoglobin: 8 g/dL — ABNORMAL LOW (ref 12.0–15.0)
MCH: 26.7 pg (ref 26.0–34.0)
MCHC: 30.1 g/dL (ref 30.0–36.0)
MCV: 88.7 fL (ref 80.0–100.0)
Platelets: 165 10*3/uL (ref 150–400)
RBC: 3 MIL/uL — ABNORMAL LOW (ref 3.87–5.11)
RDW: 15.9 % — ABNORMAL HIGH (ref 11.5–15.5)
WBC: 5.9 10*3/uL (ref 4.0–10.5)
nRBC: 0 % (ref 0.0–0.2)

## 2022-04-25 LAB — APTT
aPTT: 53 seconds — ABNORMAL HIGH (ref 24–36)
aPTT: 45 seconds — ABNORMAL HIGH (ref 24–36)
aPTT: 55 seconds — ABNORMAL HIGH (ref 24–36)

## 2022-04-25 LAB — GLUCOSE, CAPILLARY
Glucose-Capillary: 148 mg/dL — ABNORMAL HIGH (ref 70–99)
Glucose-Capillary: 170 mg/dL — ABNORMAL HIGH (ref 70–99)
Glucose-Capillary: 119 mg/dL — ABNORMAL HIGH (ref 70–99)
Glucose-Capillary: 176 mg/dL — ABNORMAL HIGH (ref 70–99)

## 2022-04-25 LAB — HEPARIN LEVEL (UNFRACTIONATED): Heparin Unfractionated: 0.26 IU/mL — ABNORMAL LOW (ref 0.30–0.70)

## 2022-04-25 LAB — PHOSPHORUS: Phosphorus: 3 mg/dL (ref 2.5–4.6)

## 2022-04-25 LAB — MAGNESIUM: Magnesium: 2.1 mg/dL (ref 1.7–2.4)

## 2022-04-25 MED ORDER — HEPARIN BOLUS VIA INFUSION
1500.0000 [IU] | Freq: Once | INTRAVENOUS | Status: AC
  Administered 2022-04-25: 1500 [IU] via INTRAVENOUS
  Filled 2022-04-25: qty 1500

## 2022-04-25 MED ORDER — ATORVASTATIN CALCIUM 20 MG PO TABS
80.0000 mg | ORAL_TABLET | Freq: Every day | ORAL | Status: DC
  Administered 2022-04-25 – 2022-04-28 (×4): 80 mg via ORAL
  Filled 2022-04-25 (×4): qty 4

## 2022-04-25 MED ORDER — PANTOPRAZOLE SODIUM 40 MG PO TBEC
40.0000 mg | DELAYED_RELEASE_TABLET | Freq: Two times a day (BID) | ORAL | Status: DC
  Administered 2022-04-25 – 2022-04-28 (×6): 40 mg via ORAL
  Filled 2022-04-25 (×6): qty 1

## 2022-04-25 MED ORDER — CLOPIDOGREL BISULFATE 75 MG PO TABS
75.0000 mg | ORAL_TABLET | Freq: Every day | ORAL | Status: DC
  Administered 2022-04-25 – 2022-04-28 (×4): 75 mg via ORAL
  Filled 2022-04-25 (×4): qty 1

## 2022-04-25 MED ORDER — POTASSIUM CHLORIDE CRYS ER 20 MEQ PO TBCR
40.0000 meq | EXTENDED_RELEASE_TABLET | Freq: Once | ORAL | Status: AC
  Administered 2022-04-25: 40 meq via ORAL
  Filled 2022-04-25: qty 4

## 2022-04-25 NOTE — Progress Notes (Signed)
ANTICOAGULATION CONSULT NOTE -   Pharmacy Consult for Heparin  Indication: arterial occlusion right lower extremity  No Known Allergies  Patient Measurements: Height: 5\' 6"  (167.6 cm) Weight: 53.1 kg (117 lb 1 oz) IBW/kg (Calculated) : 59.3 Heparin Dosing Weight: 49.8 kg   Vital Signs: Temp: 98.2 F (36.8 C) (03/31 0427) Temp Source: Oral (03/31 0427) BP: 138/80 (03/31 0427) Pulse Rate: 78 (03/31 0427)  Labs: Recent Labs     0000 04/22/22 1210 04/23/22 0731 04/23/22 2352 04/24/22 0405 04/24/22 1253 04/24/22 2022 04/25/22 0357  HGB   < >  --  9.1*  --  9.2*  --   --  8.0*  HCT  --   --  28.8*  --  29.3*  --   --  26.6*  PLT  --   --  240  --  189  --   --  165  APTT  --   --   --    < > 37* 121* 44* 53*  HEPARINUNFRC  --   --   --    < > 0.84* 0.84*  --  0.26*  CREATININE  --  0.56 0.44  --  <0.30*  --   --   --    < > = values in this interval not displayed.     CrCl cannot be calculated (This lab value cannot be used to calculate CrCl because it is not a number: <0.30).   Medical History: Past Medical History:  Diagnosis Date   Coronary artery disease    Diabetes mellitus without complication ([place])    Diabetes mellitus, type 2 (HCC)    HFrEF (heart failure with reduced ejection fraction) ([place])    Hyperlipemia    Hypertension    Ischemic cardiomyopathy    Orthostatic hypotension    PAD (peripheral artery disease) (HCC)    Tobacco use     Medications:  Medications Prior to Admission  Medication Sig Dispense Refill Last Dose   acetaminophen (TYLENOL) 650 MG CR tablet Take 650 mg by mouth every 8 (eight) hours as needed for pain.   Past Week   albuterol (VENTOLIN HFA) 108 (90 Base) MCG/ACT inhaler Inhale 2 puffs into the lungs every 6 (six) hours as needed for wheezing or shortness of breath.   Past Week   aspirin EC 81 MG tablet Take 81 mg by mouth daily. Swallow whole.   Past Week   atorvastatin (LIPITOR) 20 MG tablet Take 1 tablet (20 mg total) by  mouth daily. 30 tablet 0 Past Week   barrier cream (NON-SPECIFIED) CREA Apply 1 Application topically 2 (two) times daily as needed.   Past Week   carvedilol (COREG) 3.125 MG tablet Take 3.125 mg by mouth 2 (two) times daily with a meal.   Past Week   dapagliflozin propanediol (FARXIGA) 10 MG TABS tablet TAKE 1 TABLET BY MOUTH EVERY DAY BEFORE BREAKFAST (Patient taking differently: Take 10 mg by mouth daily before breakfast.) 90 tablet 0 Past Week   ELIQUIS 5 MG TABS tablet TAKE 1 TABLET BY MOUTH TWICE A DAY 60 tablet 3 Past Week   hydrochlorothiazide (HYDRODIURIL) 25 MG tablet Take 25 mg by mouth daily.   Past Week   ibuprofen (ADVIL) 600 MG tablet Take 600 mg by mouth every 6 (six) hours as needed for mild pain.   Past Week   insulin glargine (LANTUS SOLOSTAR) 100 UNIT/ML Solostar Pen Inject 25 Units into the skin daily.   Past Week   Multiple  Vitamins-Minerals (CENTRUM SILVER 50+WOMEN PO) Take 1 tablet by mouth daily.   Past Week   nystatin cream (MYCOSTATIN) Apply 1 Application topically 2 (two) times daily.   Past Week   pantoprazole (PROTONIX) 40 MG tablet Take 1 tablet (40 mg total) by mouth daily. 30 tablet 0 Past Week   tiotropium (SPIRIVA) 18 MCG inhalation capsule Place 1 capsule into inhaler and inhale daily.   Past Week   traMADol (ULTRAM) 50 MG tablet Take 50 mg by mouth every 6 (six) hours as needed for severe pain or moderate pain.   Past Week   Vitamin D, Ergocalciferol, (DRISDOL) 1.25 MG (50000 UNIT) CAPS capsule Take 50,000 Units by mouth once a week.   Past Week   feeding supplement (ENSURE ENLIVE / ENSURE PLUS) LIQD Take 237 mLs by mouth 3 (three) times daily between meals. 237 mL 12    nutrition supplement, JUVEN, (JUVEN) PACK Take 1 packet by mouth 2 (two) times daily between meals. 60 each 0    Semaglutide,0.25 or 0.5MG /DOS, (OZEMPIC, 0.25 OR 0.5 MG/DOSE,) 2 MG/3ML SOPN Inject 0.25 mg into the skin once a week. After four weeks increase to 0.5 mg weekly, then after four weeks  increase to 0.75 mg weekly. (Patient not taking: Reported on 04/22/2022)   Not Taking    Assessment: Pharmacy consulted to dose heparin in this 52 year old female admitted with DKA, now with arterial occlusion of lower extremity, was on Eliquis PTA for VTE prophylaxis.   Pt received Eliquis 2.5 mg PO X 1 on 3/29 @ 1122.  CrCl = 66.4 ml/min  3/30 @ 0405: aPTT = 37,  HL = 0.84  inc from 600 to 800 u/hr 3/30 @1253  aPTT= 121,  HL= 0.84,   dec from 800 to 700 u/hr 3/30 @ 2022 aPTT = 44, subtherapeutic @ 700 units/hr 3/31 @ 0357 aPTT = 53, HL = 0.26 @ 750 units/hr   Goal of Therapy:  Heparin level 0.3-0.7 units/ml aPTT 66 - 102  seconds Monitor platelets by anticoagulation protocol: Yes   Plan:  3/31 @ 0357:  aPTT = 53, HL = 0.26 - aPTT and HL are both SUBtherapeutic - Will order heparin 1500 units IV X 1 bolus and increase drip rate to 900 units/hr - Will recheck aPTT 6 hrs after rate change - Will use aPTT to guide dosing until correlating with HL>CBC daily - Will recheck HL on 4/01 with AM labs.   Mariaguadalupe Fialkowski D, PharmD 04/25/2022,5:15 AM

## 2022-04-25 NOTE — Progress Notes (Addendum)
ANTICOAGULATION CONSULT NOTE -   Pharmacy Consult for Heparin  Indication: arterial occlusion right lower extremity  No Known Allergies  Patient Measurements: Height: 5\' 6"  (167.6 cm) Weight: 53.1 kg (117 lb 1 oz) IBW/kg (Calculated) : 59.3 Heparin Dosing Weight: 49.8 kg   Vital Signs: Temp: 98.6 F (37 C) (03/31 0934) Temp Source: Oral (03/31 0934) BP: 135/69 (03/31 0934) Pulse Rate: 83 (03/31 0934)  Labs: Recent Labs    04/23/22 0731 04/23/22 2352 04/24/22 0405 04/24/22 1253 04/24/22 2022 04/25/22 0357 04/25/22 1108  HGB 9.1*  --  9.2*  --   --  8.0*  --   HCT 28.8*  --  29.3*  --   --  26.6*  --   PLT 240  --  189  --   --  165  --   APTT  --    < > 37* 121* 44* 53* 45*  HEPARINUNFRC  --    < > 0.84* 0.84*  --  0.26*  --   CREATININE 0.44  --  <0.30*  --   --  <0.30*  --    < > = values in this interval not displayed.     CrCl cannot be calculated (This lab value cannot be used to calculate CrCl because it is not a number: <0.30).   Medical History: Past Medical History:  Diagnosis Date   Coronary artery disease    Diabetes mellitus without complication ([place])    Diabetes mellitus, type 2 (HCC)    HFrEF (heart failure with reduced ejection fraction) ([place])    Hyperlipemia    Hypertension    Ischemic cardiomyopathy    Orthostatic hypotension    PAD (peripheral artery disease) (HCC)    Tobacco use     Medications:  Medications Prior to Admission  Medication Sig Dispense Refill Last Dose   acetaminophen (TYLENOL) 650 MG CR tablet Take 650 mg by mouth every 8 (eight) hours as needed for pain.   Past Week   albuterol (VENTOLIN HFA) 108 (90 Base) MCG/ACT inhaler Inhale 2 puffs into the lungs every 6 (six) hours as needed for wheezing or shortness of breath.   Past Week   aspirin EC 81 MG tablet Take 81 mg by mouth daily. Swallow whole.   Past Week   atorvastatin (LIPITOR) 20 MG tablet Take 1 tablet (20 mg total) by mouth daily. 30 tablet 0 Past Week    barrier cream (NON-SPECIFIED) CREA Apply 1 Application topically 2 (two) times daily as needed.   Past Week   carvedilol (COREG) 3.125 MG tablet Take 3.125 mg by mouth 2 (two) times daily with a meal.   Past Week   dapagliflozin propanediol (FARXIGA) 10 MG TABS tablet TAKE 1 TABLET BY MOUTH EVERY DAY BEFORE BREAKFAST (Patient taking differently: Take 10 mg by mouth daily before breakfast.) 90 tablet 0 Past Week   ELIQUIS 5 MG TABS tablet TAKE 1 TABLET BY MOUTH TWICE A DAY 60 tablet 3 Past Week   hydrochlorothiazide (HYDRODIURIL) 25 MG tablet Take 25 mg by mouth daily.   Past Week   ibuprofen (ADVIL) 600 MG tablet Take 600 mg by mouth every 6 (six) hours as needed for mild pain.   Past Week   insulin glargine (LANTUS SOLOSTAR) 100 UNIT/ML Solostar Pen Inject 25 Units into the skin daily.   Past Week   Multiple Vitamins-Minerals (CENTRUM SILVER 50+WOMEN PO) Take 1 tablet by mouth daily.   Past Week   nystatin cream (MYCOSTATIN) Apply 1 Application  topically 2 (two) times daily.   Past Week   pantoprazole (PROTONIX) 40 MG tablet Take 1 tablet (40 mg total) by mouth daily. 30 tablet 0 Past Week   tiotropium (SPIRIVA) 18 MCG inhalation capsule Place 1 capsule into inhaler and inhale daily.   Past Week   traMADol (ULTRAM) 50 MG tablet Take 50 mg by mouth every 6 (six) hours as needed for severe pain or moderate pain.   Past Week   Vitamin D, Ergocalciferol, (DRISDOL) 1.25 MG (50000 UNIT) CAPS capsule Take 50,000 Units by mouth once a week.   Past Week   feeding supplement (ENSURE ENLIVE / ENSURE PLUS) LIQD Take 237 mLs by mouth 3 (three) times daily between meals. 237 mL 12    nutrition supplement, JUVEN, (JUVEN) PACK Take 1 packet by mouth 2 (two) times daily between meals. 60 each 0    Semaglutide,0.25 or 0.5MG /DOS, (OZEMPIC, 0.25 OR 0.5 MG/DOSE,) 2 MG/3ML SOPN Inject 0.25 mg into the skin once a week. After four weeks increase to 0.5 mg weekly, then after four weeks increase to 0.75 mg weekly. (Patient  not taking: Reported on 04/22/2022)   Not Taking    Assessment: Pharmacy consulted to dose heparin in this 52 year old female admitted with DKA, now with arterial occlusion of lower extremity, was on Eliquis PTA for VTE prophylaxis.   Pt received Eliquis 2.5 mg PO X 1 on 3/29 @ 1122.  CrCl = 66.4 ml/min  3/30 @ 0405: aPTT= 37,  HL = 0.84  inc from 600 to 800 u/hr 3/30 @1253  aPTT= 121,  HL= 0.84,   dec from 800 to 700 u/hr 3/30 @ 2022 aPTT= 44, subtherapeutic @ 700 units/hr 3/31 @ 0357 aPTT=53, HL =0.26  750 units/hr > 900 u/hr 3/31 @ 1108 aPTT=45    Subtherapeutic,rate is still going at 750 units/hr per RN (never increased to 900 units/hr from previous order). Will increase to 900 units/hr now   Goal of Therapy:  Heparin level 0.3-0.7 units/ml aPTT 66 - 102  seconds Monitor platelets by anticoagulation protocol: Yes   Plan:  3/31 @1108  aPTT=45   SUBtherapeutic - Will order heparin 1500 units IV X 1 bolus and increase drip rate to 900 units/hr - Will recheck aPTT 6 hrs after rate change - Will use aPTT to guide dosing until correlating with HL >CBC daily - Will recheck HL with AM labs.   Izaya Netherton A, PharmD 04/25/2022,11:41 AM

## 2022-04-25 NOTE — Plan of Care (Signed)
  Problem: Tissue Perfusion: Goal: Adequacy of tissue perfusion will improve Outcome: Progressing   Problem: Elimination: Goal: Will not experience complications related to urinary retention Outcome: Progressing   Problem: Pain Managment: Goal: General experience of comfort will improve Outcome: Progressing

## 2022-04-25 NOTE — Progress Notes (Signed)
Subjective: Interval History: none..   Objective: Vital signs in last 24 hours: Temp:  [98.2 F (36.8 C)-98.9 F (37.2 C)] 98.2 F (36.8 C) (03/31 0427) Pulse Rate:  [70-88] 78 (03/31 0427) Resp:  [7-20] 16 (03/31 0427) BP: (92-165)/(56-84) 138/80 (03/31 0427) SpO2:  [93 %-100 %] 99 % (03/31 0427) Weight:  [53.1 kg] 53.1 kg (03/31 0427)  Intake/Output from previous day: 03/30 0701 - 03/31 0700 In: 1392.3 [P.O.:120; I.V.:1272.3] Out: 700 [Urine:700] Intake/Output this shift: No intake/output data recorded.  General appearance: alert, cooperative, and no distress Extremities: Extremities are now symmetrically warm and well-perfused, slight violaceous discoloration distally on the right toes only. No insertion site complication on the left  Lab Results: Recent Labs    04/24/22 0405 04/25/22 0357  WBC 5.8 5.9  HGB 9.2* 8.0*  HCT 29.3* 26.6*  PLT 189 165   BMET Recent Labs    04/24/22 0405 04/25/22 0357  NA 139 138  K 3.4* 3.3*  CL 104 104  CO2 26 24  GLUCOSE 81 158*  BUN 12 11  CREATININE <0.30* <0.30*  CALCIUM 9.1 8.4*    Studies/Results: PERIPHERAL VASCULAR CATHETERIZATION  Result Date: 04/24/2022 See surgical note for result.  US ARTERIAL LOWER EXTREMITY DUPLEX RIGHT(NON-ABI)  Result Date: 04/23/2022 CLINICAL DATA:  Right leg and foot pain, no distal pulses EXAM: RIGHT LOWER EXTREMITY ARTERIAL DUPLEX SCAN TECHNIQUE: Gray-scale sonography as well as color Doppler and duplex ultrasound was performed to evaluate the lower extremity arteries including the common, superficial and profunda femoral arteries, popliteal artery and calf arteries. COMPARISON:  Ultrasound examination from 02/03/2023 as well as prior arteriogram from 08/19/2021. FINDINGS: Right lower Extremity ABI: Not obtained Inflow: Common femoral artery demonstrates extensive atherosclerotic plaque without focal stenosis. Monophasic waveforms are noted. This is similar to that seen on prior  arteriography. Outflow: A stent is identified which extends from the proximal superficial femoral artery into the popliteal artery. The stent is thrombosed at its origin and throughout its course. Profunda femoral artery shows monophasic waveform. Runoff: Anterior tibial artery demonstrates monophasic waveform with decreased velocities consistent with the more proximal stent occlusion. Posterior tibial artery demonstrates monophasic waveform also related to the proximal stent occlusion. These are likely being fed by muscular collaterals. posterior and anterior tibial arterial waveforms and velocities. Vessels are patent to the ankle. IMPRESSION: Occluded femoral and popliteal arterial stent with distal monophasic waveforms consistent with the lack of significant inflow. Electronically Signed   By: Inez Catalina M.D.   On: 04/23/2022 23:16   CT ABDOMEN PELVIS WO CONTRAST  Result Date: 04/22/2022 CLINICAL DATA:  Sepsis EXAM: CT ABDOMEN AND PELVIS WITHOUT CONTRAST TECHNIQUE: Multidetector CT imaging of the abdomen and pelvis was performed following the standard protocol without IV contrast. RADIATION DOSE REDUCTION: This exam was performed according to the departmental dose-optimization program which includes automated exposure control, adjustment of the mA and/or kV according to patient size and/or use of iterative reconstruction technique. COMPARISON:  None Available. FINDINGS: Lower chest: Lung bases are clear. Old rib fractures. Dilated fluid-filled distal esophagus suggesting reflux or dysmotility. No obstructing mass lesion is identified. Hepatobiliary: No focal liver abnormality is seen. Status post cholecystectomy. No biliary dilatation. Pancreas: Unremarkable. No pancreatic ductal dilatation or surrounding inflammatory changes. Spleen: Normal in size without focal abnormality. Adrenals/Urinary Tract: No adrenal gland nodules. Central calcifications in both kidneys consistent with vascular calcifications. No  renal, ureteral, or bladder stones. No hydronephrosis or hydroureter. Bladder wall is diffusely thickened, possibly indicating outlet obstruction or cystitis.  Correlate with urinalysis. Stomach/Bowel: Stomach is fluid-filled without wall thickening, likely physiologic. Small bowel and colon are mostly decompressed. No wall thickening or inflammatory changes are appreciated. Prominent stool in the rectum with some rectal wall thickening may indicate stercoral colitis. No mesenteric collections are identified. Vascular/Lymphatic: Aortic atherosclerosis. No enlarged abdominal or pelvic lymph nodes. Reproductive: Uterus and bilateral adnexa are unremarkable. Other: No free air or free fluid in the abdomen. Abdominal wall musculature appears intact. Gas bubbles in the anterior abdominal wall soft tissues likely represent injection sites. Diffuse subcutaneous soft tissue edema. Musculoskeletal: No acute bony abnormalities. IMPRESSION: 1. No loculated collection to suggest abscess and no inflammatory changes are appreciated. 2. Fluid-filled lower esophagus and stomach likely representing reflux disease or dysmotility. 3. Aortic atherosclerosis. Electronically Signed   By: Lucienne Capers M.D.   On: 04/22/2022 01:06   DG Chest Portable 1 View  Result Date: 04/21/2022 CLINICAL DATA:  Weakness EXAM: PORTABLE CHEST 1 VIEW COMPARISON:  03/19/2022 FINDINGS: Heterogeneous opacities in the left mid and lower lung. Normal cardiac size. No pneumothorax. IMPRESSION: Heterogeneous opacities in the left mid and lower lung suspicious for pneumonia. Radiographic follow-up to resolution recommended. Electronically Signed   By: Donavan Foil M.D.   On: 04/21/2022 23:22   Anti-infectives: Anti-infectives (From admission, onward)    Start     Dose/Rate Route Frequency Ordered Stop   04/24/22 0812  ceFAZolin (ANCEF) IVPB 1 g/50 mL premix        over 30 Minutes  Continuous PRN 04/24/22 0814 04/24/22 1006   04/24/22 0758  ceFAZolin  (ANCEF) 2-4 GM/100ML-% IVPB       Note to Pharmacy: Forestine Chute L: cabinet override      04/24/22 0758 04/24/22 1043   04/24/22 0745  ceFAZolin (ANCEF) powder 1 g  Status:  Discontinued        1 g Other To Surgery 04/24/22 0653 04/24/22 0658   04/24/22 0659  ceFAZolin (ANCEF) IVPB 2g/100 mL premix        2 g 200 mL/hr over 30 Minutes Intravenous 60 min pre-op 04/24/22 0659     04/23/22 1100  azithromycin (ZITHROMAX) tablet 500 mg  Status:  Discontinued        500 mg Oral Daily 04/23/22 1005 04/23/22 1525   04/22/22 0900  cefTRIAXone (ROCEPHIN) 2 g in sodium chloride 0.9 % 100 mL IVPB        2 g 200 mL/hr over 30 Minutes Intravenous Every 24 hours 04/22/22 0031     04/22/22 0030  piperacillin-tazobactam (ZOSYN) IVPB 3.375 g  Status:  Discontinued        3.375 g 12.5 mL/hr over 240 Minutes Intravenous Every 8 hours 04/22/22 0020 04/22/22 0031   04/21/22 2115  ceFEPIme (MAXIPIME) 2 g in sodium chloride 0.9 % 100 mL IVPB        2 g 200 mL/hr over 30 Minutes Intravenous  Once 04/21/22 2103 04/21/22 2143   04/21/22 2115  vancomycin (VANCOCIN) IVPB 1000 mg/200 mL premix        1,000 mg 200 mL/hr over 60 Minutes Intravenous  Once 04/21/22 2103 04/21/22 2222       Assessment/Plan: s/p Procedure(s): Lower Extremity Angiography (Right) Continue heparin drip today, transition to Eliquis tomorrow, increase Lipitor today, start Plavix today.  Mobilization without limitation. I did discuss with her the findings on the procedure performed, and the fact that she may eventually require open endarterectomy in the right common femoral division.  She appears to be warm and  well-perfused currently.  All questions were answered. Zara Chess 04/25/2022, 8:09 AM

## 2022-04-25 NOTE — Progress Notes (Signed)
Patient stressed concerns to RN about having frequent UTIs. Patient also stated she was recently admitted and was treated for UTI in February. RN was able to collect urine sample and send to lab per MD order.   Fuller Mandril, RN

## 2022-04-25 NOTE — Progress Notes (Signed)
ANTICOAGULATION CONSULT NOTE -   Pharmacy Consult for Heparin  Indication: arterial occlusion right lower extremity  No Known Allergies  Patient Measurements: Height: 5\' 6"  (167.6 cm) Weight: 53.1 kg (117 lb 1 oz) IBW/kg (Calculated) : 59.3 Heparin Dosing Weight: 49.8 kg   Vital Signs: Temp: 98.2 F (36.8 C) (03/31 1707) Temp Source: Oral (03/31 0934) BP: 146/72 (03/31 1707) Pulse Rate: 81 (03/31 1707)  Labs: Recent Labs    04/23/22 0731 04/23/22 2352 04/24/22 0405 04/24/22 1253 04/24/22 2022 04/25/22 0357 04/25/22 1108 04/25/22 1817  HGB 9.1*  --  9.2*  --   --  8.0*  --   --   HCT 28.8*  --  29.3*  --   --  26.6*  --   --   PLT 240  --  189  --   --  165  --   --   APTT  --    < > 37* 121*   < > 53* 45* 55*  HEPARINUNFRC  --    < > 0.84* 0.84*  --  0.26*  --   --   CREATININE 0.44  --  <0.30*  --   --  <0.30*  --   --    < > = values in this interval not displayed.     CrCl cannot be calculated (This lab value cannot be used to calculate CrCl because it is not a number: <0.30).   Medical History: Past Medical History:  Diagnosis Date   Coronary artery disease    Diabetes mellitus without complication ([place])    Diabetes mellitus, type 2 ([facility])    HFrEF (heart failure with reduced ejection fraction) ([place])    Hyperlipemia    Hypertension    Ischemic cardiomyopathy    Orthostatic hypotension    PAD (peripheral artery disease) ([facility])    Tobacco use     Medications:  Medications Prior to Admission  Medication Sig Dispense Refill Last Dose   acetaminophen (TYLENOL) 650 MG CR tablet Take 650 mg by mouth every 8 (eight) hours as needed for pain.   Past Week   albuterol (VENTOLIN HFA) 108 (90 Base) MCG/ACT inhaler Inhale 2 puffs into the lungs every 6 (six) hours as needed for wheezing or shortness of breath.   Past Week   aspirin EC 81 MG tablet Take 81 mg by mouth daily. Swallow whole.   Past Week   atorvastatin (LIPITOR) 20 MG tablet Take 1 tablet (20 mg  total) by mouth daily. 30 tablet 0 Past Week   barrier cream (NON-SPECIFIED) CREA Apply 1 Application topically 2 (two) times daily as needed.   Past Week   carvedilol (COREG) 3.125 MG tablet Take 3.125 mg by mouth 2 (two) times daily with a meal.   Past Week   dapagliflozin propanediol (FARXIGA) 10 MG TABS tablet TAKE 1 TABLET BY MOUTH EVERY DAY BEFORE BREAKFAST (Patient taking differently: Take 10 mg by mouth daily before breakfast.) 90 tablet 0 Past Week   ELIQUIS 5 MG TABS tablet TAKE 1 TABLET BY MOUTH TWICE A DAY 60 tablet 3 Past Week   hydrochlorothiazide (HYDRODIURIL) 25 MG tablet Take 25 mg by mouth daily.   Past Week   ibuprofen (ADVIL) 600 MG tablet Take 600 mg by mouth every 6 (six) hours as needed for mild pain.   Past Week   insulin glargine (LANTUS SOLOSTAR) 100 UNIT/ML Solostar Pen Inject 25 Units into the skin daily.   Past Week   Multiple Vitamins-Minerals (CENTRUM  SILVER 50+WOMEN PO) Take 1 tablet by mouth daily.   Past Week   nystatin cream (MYCOSTATIN) Apply 1 Application topically 2 (two) times daily.   Past Week   pantoprazole (PROTONIX) 40 MG tablet Take 1 tablet (40 mg total) by mouth daily. 30 tablet 0 Past Week   tiotropium (SPIRIVA) 18 MCG inhalation capsule Place 1 capsule into inhaler and inhale daily.   Past Week   traMADol (ULTRAM) 50 MG tablet Take 50 mg by mouth every 6 (six) hours as needed for severe pain or moderate pain.   Past Week   Vitamin D, Ergocalciferol, (DRISDOL) 1.25 MG (50000 UNIT) CAPS capsule Take 50,000 Units by mouth once a week.   Past Week   feeding supplement (ENSURE ENLIVE / ENSURE PLUS) LIQD Take 237 mLs by mouth 3 (three) times daily between meals. 237 mL 12    nutrition supplement, JUVEN, (JUVEN) PACK Take 1 packet by mouth 2 (two) times daily between meals. 60 each 0    Semaglutide,0.25 or 0.5MG /DOS, (OZEMPIC, 0.25 OR 0.5 MG/DOSE,) 2 MG/3ML SOPN Inject 0.25 mg into the skin once a week. After four weeks increase to 0.5 mg weekly, then after  four weeks increase to 0.75 mg weekly. (Patient not taking: Reported on 04/22/2022)   Not Taking    Assessment: Pharmacy consulted to dose heparin in this 52 year old female admitted with DKA, now with arterial occlusion of lower extremity, was on Eliquis PTA for VTE prophylaxis.   Pt received Eliquis 2.5 mg PO X 1 on 3/29 @ 1122.  CrCl = 66.4 ml/min  3/30 @ 0405: aPTT= 37,  HL = 0.84  inc from 600 to 800 u/hr 3/30 @1253  aPTT= 121,  HL= 0.84,   dec from 800 to 700 u/hr 3/30 @ 2022 aPTT= 44, subtherapeutic @ 700 units/hr 3/31 @ 0357 aPTT=53, HL =0.26  750 units/hr > 900 u/hr 3/31 @ 1108 aPTT=45    Subtherapeutic,rate is still going at 750 units/hr per RN (never increased to 900 units/hr from previous order). Will increase to 900 units/hr now 3/31 @ 1817 aPTT = 55, subtherapeutic @ 900 units/hr  Goal of Therapy:  Heparin level 0.3-0.7 units/ml aPTT 66 - 102  seconds Monitor platelets by anticoagulation protocol: Yes   Plan: aPTT is subtherapeutic - Will order heparin 1500 units IV X 1 bolus and increase drip rate to 1000 units/hr - Will recheck aPTT 6 hrs after rate change - Will use aPTT to guide dosing until correlating with HL >CBC daily - Will recheck HL with AM labs.   Lorin Picket, PharmD 04/25/2022,6:49 PM

## 2022-04-25 NOTE — Progress Notes (Signed)
PROGRESS NOTE    Beth Carroll  [id number] DOB: [birth year] DOA: 04/21/2022 PCP: Baxter Hire, MD  [id number]  LOS: 4 days   Brief hospital course:   Assessment & Plan: 52 year old female presenting to [facility] ED from home via EMS on 04/21/2022 for evaluation of weakness.   Tuesday (3/26) when she began having " continuous vomiting" and multiple episodes of diarrhea. She has not taken any insulin in the last 2 days due to the nausea vomiting and diarrhea.  She is currently receiving at home physical therapy in order to ambulate with a walker but is otherwise wheelchair-bound.   Admit to ICU with severe DKA and metabolic acidosis in the setting of suspected sepsis secondary to suspected UTI versus intra-abdominal infection with hypotension and potential need for vasopressor support.  Pt did not need pressor, and was transferred to [facility] on 04/23/22.  Diabetic ketoacidosis  Severe AGMA Insulin non-compliance PMHx: poorly controlled T2DM --off insulin gtt and transitioned to subQ insulin around 11 am on 3/28. --glargine on hold for now since BG was 80's. --ACHS and SSI  Suspected Sepsis  Suspected UTI --leukocytosis, tachypnea.  Started on vanc/cefepime on presentation, and transitioned to ceftriaxone for UTI, however, no urine culture sent.  CXR showed mild Heterogeneous opacities in the left mid and lower lung, however, pt does not have symptoms of PNA. --pt continued to complained of dysuria even after 3 days of IV abx Plan: --cont ceftriaxone for 5 day course for empiric tx  Nausea/ Vomiting/ Diarrhea --CT abdomen/pelvis wo contrast no acute finding. - GI panel PCR & C-diff both neg --d/c MIVF today and encourage oral hydration   Acute Kidney Injury suspect pre-renal secondary to hypovolemia in the setting of DKA, vomiting/diarrhea Baseline Cr: 0.47, Cr on admission:1.54 --Cr back to baseline after IVF   Transaminitis, ruled out --alk phos 175, mildly elevated  Chronic  ICM, HFrEF, CAD s/p PCI  --cont home ASA --home coreg due to hypotension   COPD PMHx: former smoker --not in exacerbation --cont Spiriva   Chronic Wounds- sacrum & bilateral heels - consult WOC - utilize offloading devices, Turn Q 2 - optimize nutrition once stabilized  Hypophos --monitor and replete PRN  PAD with recurrent right superficial femoral, right common femoral, popliteal artery occlusion  --angiogram with intervention on 3/30 --cont heparin gtt --cont ASA, start plavix --increase statin   DVT prophylaxis: [id number] gtt Code Status: Full code  Family Communication:  Level of care: Med-Surg Dispo:   The patient is from: home Anticipated d/c is to: home Anticipated d/c date is: 1-2 days   Subjective and Interval History:  Right foot pain improved.  Dysuria improved.  Complained of severe GERD symptom and sensation of pill being stuck in her throat.  RN noted right second toenail detached.   Objective: Vitals:   04/24/22 2005 04/25/22 0427 04/25/22 0934 04/25/22 1707  BP: 122/67 138/80 135/69 (!) 146/72  Pulse: 85 78 83 81  Resp: 20 16 18 16   Temp: 98.2 F (36.8 C) 98.2 F (36.8 C) 98.6 F (37 C) 98.2 F (36.8 C)  TempSrc: Oral Oral Oral   SpO2: 97% 99% 100% 99%  Weight:  53.1 kg    Height:        Intake/Output Summary (Last 24 hours) at 04/25/2022 1713 Last data filed at 04/25/2022 1551 Gross per 24 hour  Intake 1924.53 ml  Output 1030 ml  Net 894.53 ml   Filed Weights   04/23/22 0300 04/24/22 0637 04/25/22 0427  Weight:  51.1 kg 55.7 kg 53.1 kg    Examination:   Constitutional: NAD, AAOx3 HEENT: conjunctivae and lids normal, EOMI CV: No cyanosis.   RESP: normal respiratory effort, on RA Extremities: edema in RLE, right toes less purple today Neuro: II - XII grossly intact.   Psych: Normal mood and affect.  Appropriate judgement and reason   Data Reviewed: I have personally reviewed labs and imaging studies  Time spent: 35  minutes  Enzo Bi, MD [facility] If 7PM-7AM, please contact night-coverage 04/25/2022, 5:13 PM

## 2022-04-26 ENCOUNTER — Ambulatory Visit: Payer: Medicaid Other | Admitting: Physician Assistant

## 2022-04-26 ENCOUNTER — Encounter: Payer: Self-pay | Admitting: Specialist

## 2022-04-26 DIAGNOSIS — E1110 Type 2 diabetes mellitus with ketoacidosis without coma: Secondary | ICD-10-CM | POA: Diagnosis not present

## 2022-04-26 LAB — GLUCOSE, CAPILLARY
Glucose-Capillary: 200 mg/dL — ABNORMAL HIGH (ref 70–99)
Glucose-Capillary: 190 mg/dL — ABNORMAL HIGH (ref 70–99)
Glucose-Capillary: 336 mg/dL — ABNORMAL HIGH (ref 70–99)
Glucose-Capillary: 363 mg/dL — ABNORMAL HIGH (ref 70–99)
Glucose-Capillary: 208 mg/dL — ABNORMAL HIGH (ref 70–99)

## 2022-04-26 LAB — URINE CULTURE: Culture: <10000 — AB

## 2022-04-26 LAB — MAGNESIUM: Magnesium: 1.8 mg/dL (ref 1.7–2.4)

## 2022-04-26 LAB — HEPARIN LEVEL (UNFRACTIONATED)
Heparin Unfractionated: 0.23 IU/mL — ABNORMAL LOW (ref 0.30–0.70)
Heparin Unfractionated: 0.17 IU/mL — ABNORMAL LOW (ref 0.30–0.70)

## 2022-04-26 LAB — BASIC METABOLIC PANEL
Anion gap: 13 (ref 5–15)
BUN: 12 mg/dL (ref 6–20)
CO2: 19 mmol/L — ABNORMAL LOW (ref 22–32)
Calcium: 8.5 mg/dL — ABNORMAL LOW (ref 8.9–10.3)
Chloride: 106 mmol/L (ref 98–111)
Creatinine, Ser: 0.47 mg/dL (ref 0.44–1.00)
GFR, Estimated: >60 mL/min (ref >60)
Glucose, Bld: 238 mg/dL — ABNORMAL HIGH (ref 70–99)
Potassium: 3.7 mmol/L (ref 3.5–5.1)
Sodium: 138 mmol/L (ref 135–145)

## 2022-04-26 LAB — CBC
HCT: 26.9 % — ABNORMAL LOW (ref 36.0–46.0)
Hemoglobin: 8.2 g/dL — ABNORMAL LOW (ref 12.0–15.0)
MCH: 27.2 pg (ref 26.0–34.0)
MCHC: 30.5 g/dL (ref 30.0–36.0)
MCV: 89.1 fL (ref 80.0–100.0)
Platelets: 165 10*3/uL (ref 150–400)
RBC: 3.02 MIL/uL — ABNORMAL LOW (ref 3.87–5.11)
RDW: 15.5 % (ref 11.5–15.5)
WBC: 5.8 10*3/uL (ref 4.0–10.5)
nRBC: 0 % (ref 0.0–0.2)

## 2022-04-26 LAB — CULTURE, BLOOD (ROUTINE X 2)
Culture: NO GROWTH
Culture: NO GROWTH
Special Requests: ADEQUATE
Special Requests: ADEQUATE

## 2022-04-26 LAB — HEMOGLOBIN A1C
Hgb A1c MFr Bld: 9.8 % — ABNORMAL HIGH (ref 4.8–5.6)
Mean Plasma Glucose: 235 mg/dL

## 2022-04-26 LAB — APTT
aPTT: 72 seconds — ABNORMAL HIGH (ref 24–36)
aPTT: 62 seconds — ABNORMAL HIGH (ref 24–36)

## 2022-04-26 MED ORDER — INSULIN ASPART 100 UNIT/ML IJ SOLN
0.0000 [IU] | Freq: Three times a day (TID) | INTRAMUSCULAR | Status: DC
  Administered 2022-04-27 (×2): 3 [IU] via SUBCUTANEOUS
  Administered 2022-04-27: 11 [IU] via SUBCUTANEOUS
  Administered 2022-04-28: 3 [IU] via SUBCUTANEOUS
  Filled 2022-04-26 (×4): qty 1

## 2022-04-26 MED ORDER — APIXABAN 5 MG PO TABS
5.0000 mg | ORAL_TABLET | Freq: Two times a day (BID) | ORAL | Status: DC
  Administered 2022-04-26 – 2022-04-28 (×4): 5 mg via ORAL
  Filled 2022-04-26 (×4): qty 1

## 2022-04-26 MED ORDER — INSULIN ASPART 100 UNIT/ML IJ SOLN
0.0000 [IU] | Freq: Every day | INTRAMUSCULAR | Status: DC
  Administered 2022-04-26: 2 [IU] via SUBCUTANEOUS
  Filled 2022-04-26: qty 1

## 2022-04-26 MED ORDER — INSULIN GLARGINE-YFGN 100 UNIT/ML ~~LOC~~ SOLN
15.0000 [IU] | Freq: Every day | SUBCUTANEOUS | Status: DC
  Administered 2022-04-26: 15 [IU] via SUBCUTANEOUS
  Filled 2022-04-26 (×2): qty 0.15

## 2022-04-26 MED ORDER — INSULIN GLARGINE-YFGN 100 UNIT/ML ~~LOC~~ SOLN
7.0000 [IU] | Freq: Once | SUBCUTANEOUS | Status: AC
  Administered 2022-04-26: 7 [IU] via SUBCUTANEOUS
  Filled 2022-04-26: qty 0.07

## 2022-04-26 MED ORDER — HEPARIN BOLUS VIA INFUSION
800.0000 [IU] | Freq: Once | INTRAVENOUS | Status: AC
  Administered 2022-04-26: 800 [IU] via INTRAVENOUS
  Filled 2022-04-26: qty 800

## 2022-04-26 MED ORDER — ORAL CARE MOUTH RINSE: 15.0000 mL | OROMUCOSAL | Status: DC | PRN

## 2022-04-26 MED ORDER — HEPARIN BOLUS VIA INFUSION
1500.0000 [IU] | Freq: Once | INTRAVENOUS | Status: AC
  Administered 2022-04-26: 1500 [IU] via INTRAVENOUS
  Filled 2022-04-26: qty 1500

## 2022-04-26 NOTE — Progress Notes (Signed)
Physical Therapy Treatment Patient Details Name: Beth Carroll: [id number] DOB: April 28, 1970 Today's Date: 04/26/2022   History of Present Illness Pt is a 52 y/o F admitted on 04/21/22 after presenting with c/o vomiting & diarrhea. Upon arrival pt was somnolent, hypothermic, tachypneic, & hypotensive. Lab work consistent with DKA, pseudohyponatremia, AKI, AGMA, transaminitis, lactic acidosis, leukocytosis with L shift & possible UTI. Chest x-ray concerning for PNA. Pt is being treated for severe DKA & metabolic acidosis in the setting of suspected sepsis 2/2 suspected UTI versus intraabdominal infection with hypotension.    PT Comments    Pt received in supine position and agreeable to therapy.  Pt states she performed transfer with use of the sliding board transfer during last session and would like to attempt to do that at later time.  Pt states she just utilized the purewick for voiding, and would like to attempt transfer to [facility] when she needs to void again.  Pt did agree to performing bed level exercises and put forth good effort throughout.  Pt does require AAROM on the R LE to perform, but does attempt to perform with as much strength as she has.  Pt requested HEP packet to work on in between sessions.  Pt left with call bell and all needs met.     Recommendations for follow up therapy are one component of a multi-disciplinary discharge planning process, led by the attending physician.  Recommendations may be updated based on patient status, additional functional criteria and insurance authorization.     Assistance Recommended at Discharge Frequent or constant Supervision/Assistance  Patient can return home with the following A lot of help with walking and/or transfers;A lot of help with bathing/dressing/bathroom;Assist for transportation;Help with stairs or ramp for entrance;Assistance with cooking/housework   Equipment Recommendations  BSC/3in1;Other (comment) (drop arm BSC and slide  board per pt request)    Recommendations for Other Services       Precautions / Restrictions Precautions Precautions: Fall Restrictions Weight Bearing Restrictions: No     Mobility  Bed Mobility               General bed mobility comments: pt deferred mobility at this session as she just utilized purewick for voiding, and wants to attempt to transfer to [facility] at later time.    Transfers                        Ambulation/Gait                   Stairs             Wheelchair Mobility    Modified Rankin (Stroke Patients Only)       Balance Overall balance assessment: Needs assistance Sitting-balance support: Feet supported Sitting balance-Leahy Scale: Good                                      Cognition Arousal/Alertness: Awake/alert Behavior During Therapy: WFL for tasks assessed/performed Overall Cognitive Status: Within Functional Limits for tasks assessed                                          Exercises Total Joint Exercises Ankle Circles/Pumps: AROM, AAROM, Strengthening, Both, 10 reps, Supine Quad Sets: AROM, AAROM, Strengthening, Both, 10 reps, Supine  Gluteal Sets: AROM, AAROM, Strengthening, Both, 10 reps, Supine Heel Slides: AROM, AAROM, Strengthening, Both, 10 reps, Supine Hip ABduction/ADduction: AROM, AAROM, Strengthening, Both, 10 reps, Supine Straight Leg Raises: AROM, AAROM, Strengthening, Both, 10 reps, Supine    General Comments        Pertinent Vitals/Pain Pain Assessment Pain Assessment: Faces Faces Pain Scale: Hurts little more Pain Location: R LE with mobility Pain Descriptors / Indicators: Sore Pain Intervention(s): Limited activity within patient's tolerance, Monitored during session, Repositioned    Home Living                          Prior Function            PT Goals (current goals can now be found in the care plan section) Acute Rehab PT  Goals Patient Stated Goal: decreased pain PT Goal Formulation: With patient Time For Goal Achievement: 05/07/22 Potential to Achieve Goals: Fair Progress towards PT goals: Progressing toward goals    Frequency    Min 3X/week      PT Plan Current plan remains appropriate    Co-evaluation              AM-PAC PT "6 Clicks" Mobility   Outcome Measure  Help needed turning from your back to your side while in a flat bed without using bedrails?: A Lot Help needed moving from lying on your back to sitting on the side of a flat bed without using bedrails?: Total Help needed moving to and from a bed to a chair (including a wheelchair)?: A Little Help needed standing up from a chair using your arms (e.g., wheelchair or bedside chair)?: Total Help needed to walk in hospital room?: Total Help needed climbing 3-5 steps with a railing? : Total 6 Click Score: 9    End of Session   Activity Tolerance: Patient tolerated treatment well Patient left: in chair;with call bell/phone within reach;with chair alarm set Nurse Communication: Mobility status PT Visit Diagnosis: Muscle weakness (generalized) (M62.81);Other abnormalities of gait and mobility (R26.89);Difficulty in walking, not elsewhere classified (R26.2)     Time: [id number] PT Time Calculation (min) (ACUTE ONLY): 36 min  Charges:  $Therapeutic Exercise: 23-37 mins                     Gwenlyn Saran, PT, DPT Physical Therapist - [facility]  04/26/22, 1:19 PM    Christie Nottingham 04/26/2022, 1:16 PM

## 2022-04-26 NOTE — Progress Notes (Signed)
PROGRESS NOTE    Beth Carroll  [id number] DOB: 1970-04-25 DOA: 04/21/2022 PCP: Baxter Hire, MD  [id number]  LOS: 5 days   Brief hospital course:   Assessment & Plan: 52 year old female presenting to [facility] ED from home via EMS on 04/21/2022 for evaluation of weakness.   Tuesday (3/26) when she began having " continuous vomiting" and multiple episodes of diarrhea. She has not taken any insulin in the last 2 days due to the nausea vomiting and diarrhea.  She is currently receiving at home physical therapy in order to ambulate with a walker but is otherwise wheelchair-bound.   Admit to ICU with severe DKA and metabolic acidosis in the setting of suspected sepsis secondary to suspected UTI versus intra-abdominal infection with hypotension and potential need for vasopressor support.  Pt did not need pressor, and was transferred to [facility] on 04/23/22.  Diabetic ketoacidosis  Severe AGMA Insulin non-compliance PMHx: poorly controlled T2DM --off insulin gtt and transitioned to subQ insulin around 11 am on 3/28. --resume glargine at 15u daily --ACHS and SSI  Suspected Sepsis  Suspected UTI --leukocytosis, tachypnea.  Started on vanc/cefepime on presentation, and transitioned to ceftriaxone for UTI, however, no urine culture sent.  CXR showed mild Heterogeneous opacities in the left mid and lower lung, however, pt does not have symptoms of PNA. --pt continued to complained of dysuria even after 3 days of IV abx Plan: --cont ceftriaxone day 5 of 5  Nausea/ Vomiting/ Diarrhea --CT abdomen/pelvis wo contrast no acute finding. - GI panel PCR & C-diff both neg --encourage oral hydration   Acute Kidney Injury suspect pre-renal secondary to hypovolemia in the setting of DKA, vomiting/diarrhea Baseline Cr: 0.47, Cr on admission:1.54 --Cr back to baseline after IVF   PAD with recurrent right superficial femoral, right common femoral, popliteal artery occlusion  --angiogram with  intervention on 3/30 --transition from heparin to Eliquis today --cont ASA, start plavix --cont statin at increased dose  Transaminitis, ruled out --alk phos 175, mildly elevated  Chronic ICM, HFrEF, CAD s/p PCI  --cont home ASA --hold home coreg due to hypotension   COPD PMHx: former smoker --not in exacerbation --cont Spiriva   Chronic Wounds- sacrum & bilateral heels - consult WOC - utilize offloading devices, Turn Q 2 - optimize nutrition once stabilized  Hypophos --monitor and replete PRN   DVT prophylaxis: [id number] gtt Code Status: Full code  Family Communication: husband updated at bedside today Level of care: Med-Surg Dispo:   The patient is from: home Anticipated d/c is to: home Anticipated d/c date is: tomorrow   Subjective and Interval History:  Pt complained of mild dysuria.  No pain in her toes.   Objective: Vitals:   04/26/22 0444 04/26/22 0839 04/26/22 1644 04/26/22 2059  BP: 134/73 127/81 (!) 150/70 108/68  Pulse: 87 82 80 73  Resp: 16 16 20 19   Temp: 98.2 F (36.8 C) 98.4 F (36.9 C) 98.6 F (37 C) 98.6 F (37 C)  TempSrc: Oral Oral  Oral  SpO2: 97% 100% (!) 80% 98%  Weight: 54 kg     Height:        Intake/Output Summary (Last 24 hours) at 04/26/2022 2117 Last data filed at 04/26/2022 0551 Gross per 24 hour  Intake --  Output 0 ml  Net 0 ml   Filed Weights   04/24/22 0637 04/25/22 0427 04/26/22 0444  Weight: 55.7 kg 53.1 kg 54 kg    Examination:   Constitutional: NAD, AAOx3 HEENT: conjunctivae and lids  normal, EOMI CV: No cyanosis.   RESP: normal respiratory effort, on RA Extremities: edema in BLE, toenail falling off right middle toe. SKIN: warm, dry Neuro: II - XII grossly intact.   Psych: Normal mood and affect.  Appropriate judgement and reason   Data Reviewed: I have personally reviewed labs and imaging studies  Time spent: 35 minutes  Enzo Bi, MD [facility] If 7PM-7AM, please contact  night-coverage 04/26/2022, 9:17 PM

## 2022-04-26 NOTE — Progress Notes (Signed)
Progress Note    04/26/2022 11:27 AM 2 Days Post-Op  Subjective:  52 year old female presenting to [facility] ED from home via EMS on 04/21/2022 for evaluation of weakness. She is currently receiving at home physical therapy in order to ambulate with a walker but is otherwise wheelchair-bound.  On 04/24/2022 patient underwent aortogram with selective right lower extremity angiogram.  She had a percutaneous angioplasty of the right posterior tibial artery.  She also had mechanical thrombectomy of the right common femoral artery, superficial femoral distribution, popliteal and right deep femoral origin.  On exam this morning patient is resting comfortably in bed.  Right lower extremity is warm to touch.  Patient has no complaints.  Patient states she feels much better and her right leg feels better.  Continues to work with PT.  Patient endorses only standing and not walking at this point in time.  Eitel's all remained stable     Vitals:   04/26/22 0444 04/26/22 0839  BP: 134/73 127/81  Pulse: 87 82  Resp: 16 16  Temp: 98.2 F (36.8 C) 98.4 F (36.9 C)  SpO2: 97% 100%   Physical Exam: Cardiac:  RRR Lungs: Clear on auscultation throughout normal respiratory effort nonproductive cough. Incisions: Left groin with incision dressing clean dry and intact. Extremities: Bilateral lower extremities are symmetrically warm to touch and well-perfused.  She still continues to have slight discoloration distally on her right toes only. Abdomen: Positive bowel sounds all 4 quadrants, soft, flat, nondistended, nontender. Neurologic: No oriented x 4, neuro intact, follows all commands appropriately.  CBC    Component Value Date/Time   WBC 5.8 04/26/2022 0145   RBC 3.02 (L) 04/26/2022 0145   HGB 8.2 (L) 04/26/2022 0145   HCT 26.9 (L) 04/26/2022 0145   PLT 165 04/26/2022 0145   MCV 89.1 04/26/2022 0145   MCH 27.2 04/26/2022 0145   MCHC 30.5 04/26/2022 0145   RDW 15.5 04/26/2022 0145   LYMPHSABS 1.5  04/21/2022 2038   MONOABS 0.6 04/21/2022 2038   EOSABS 0.0 04/21/2022 2038   BASOSABS 0.0 04/21/2022 2038    BMET    Component Value Date/Time   NA 138 04/26/2022 0145   NA 137 03/18/2021 1541   K 3.7 04/26/2022 0145   CL 106 04/26/2022 0145   CO2 19 (L) 04/26/2022 0145   GLUCOSE 238 (H) 04/26/2022 0145   BUN 12 04/26/2022 0145   BUN 15 03/18/2021 1541   CREATININE 0.47 04/26/2022 0145   CALCIUM 8.5 (L) 04/26/2022 0145   GFRNONAA >60 04/26/2022 0145    INR    Component Value Date/Time   INR 1.5 (H) 08/19/2021 2107     Intake/Output Summary (Last 24 hours) at 04/26/2022 1127 Last data filed at 04/26/2022 0551 Gross per 24 hour  Intake 670.76 ml  Output 130 ml  Net 540.76 ml     Assessment/Plan:  52 y.o. female is s/p aortogram with selective right lower extremity angiogram.  She had a percutaneous angioplasty and thrombectomy of the right posterior tibial artery.  2 Days Post-Op   PLAN: Transition to eliquis today.  Continue Plavix 75 mg Daily Ambulate with assistance/PT as much as possible.  Per vascular surgery once patient is stable on oral anticoagulation patient may be discharged to home/rehab family preference.  We will see the patient back in clinic in 1 month with bilateral lower ultrasounds with ABIs. Vascular surgery will sign off this case.  If needed again please reconsult.  DVT prophylaxis:  Heparin Infusion converted to  Eliquis PO    Drema Pry [facility] 04/26/2022 11:27 AM

## 2022-04-26 NOTE — Progress Notes (Signed)
ANTICOAGULATION CONSULT NOTE -   Pharmacy Consult for Heparin  Indication: arterial occlusion right lower extremity  No Known Allergies  Patient Measurements: Height: 5\' 6"  (167.6 cm) Weight: 53.1 kg (117 lb 1 oz) IBW/kg (Calculated) : 59.3 Heparin Dosing Weight: 49.8 kg   Vital Signs: Temp: 99.1 F (37.3 C) (03/31 1952) Temp Source: Oral (03/31 1952) BP: 149/71 (03/31 1952) Pulse Rate: 84 (03/31 1952)  Labs: Recent Labs    04/24/22 0405 04/24/22 1253 04/24/22 2022 04/25/22 0357 04/25/22 1108 04/25/22 1817 04/26/22 0145  HGB 9.2*  --   --  8.0*  --   --  8.2*  HCT 29.3*  --   --  26.6*  --   --  26.9*  PLT 189  --   --  165  --   --  165  APTT 37* 121*   < > 53* 45* 55* 72*  HEPARINUNFRC 0.84* 0.84*  --  0.26*  --   --  0.23*  CREATININE <0.30*  --   --  <0.30*  --   --  0.47   < > = values in this interval not displayed.     Estimated Creatinine Clearance: 69 mL/min (by C-G formula based on SCr of 0.47 mg/dL).   Medical History: Past Medical History:  Diagnosis Date   Coronary artery disease    Diabetes mellitus without complication ([place])    Diabetes mellitus, type 2 (H[facility])    HFrEF (heart failure with reduced ejection fraction) ([place])    Hyperlipemia    Hypertension    Ischemic cardiomyopathy    Orthostatic hypotension    PAD (peripheral artery disease) (H[facility])    Tobacco use     Medications:  Medications Prior to Admission  Medication Sig Dispense Refill Last Dose   acetaminophen (TYLENOL) 650 MG CR tablet Take 650 mg by mouth every 8 (eight) hours as needed for pain.   Past Week   albuterol (VENTOLIN HFA) 108 (90 Base) MCG/ACT inhaler Inhale 2 puffs into the lungs every 6 (six) hours as needed for wheezing or shortness of breath.   Past Week   aspirin EC 81 MG tablet Take 81 mg by mouth daily. Swallow whole.   Past Week   atorvastatin (LIPITOR) 20 MG tablet Take 1 tablet (20 mg total) by mouth daily. 30 tablet 0 Past Week   barrier cream  (NON-SPECIFIED) CREA Apply 1 Application topically 2 (two) times daily as needed.   Past Week   carvedilol (COREG) 3.125 MG tablet Take 3.125 mg by mouth 2 (two) times daily with a meal.   Past Week   dapagliflozin propanediol (FARXIGA) 10 MG TABS tablet TAKE 1 TABLET BY MOUTH EVERY DAY BEFORE BREAKFAST (Patient taking differently: Take 10 mg by mouth daily before breakfast.) 90 tablet 0 Past Week   ELIQUIS 5 MG TABS tablet TAKE 1 TABLET BY MOUTH TWICE A DAY 60 tablet 3 Past Week   hydrochlorothiazide (HYDRODIURIL) 25 MG tablet Take 25 mg by mouth daily.   Past Week   ibuprofen (ADVIL) 600 MG tablet Take 600 mg by mouth every 6 (six) hours as needed for mild pain.   Past Week   insulin glargine (LANTUS SOLOSTAR) 100 UNIT/ML Solostar Pen Inject 25 Units into the skin daily.   Past Week   Multiple Vitamins-Minerals (CENTRUM SILVER 50+WOMEN PO) Take 1 tablet by mouth daily.   Past Week   nystatin cream (MYCOSTATIN) Apply 1 Application topically 2 (two) times daily.   Past Week  pantoprazole (PROTONIX) 40 MG tablet Take 1 tablet (40 mg total) by mouth daily. 30 tablet 0 Past Week   tiotropium (SPIRIVA) 18 MCG inhalation capsule Place 1 capsule into inhaler and inhale daily.   Past Week   traMADol (ULTRAM) 50 MG tablet Take 50 mg by mouth every 6 (six) hours as needed for severe pain or moderate pain.   Past Week   Vitamin D, Ergocalciferol, (DRISDOL) 1.25 MG (50000 UNIT) CAPS capsule Take 50,000 Units by mouth once a week.   Past Week   feeding supplement (ENSURE ENLIVE / ENSURE PLUS) LIQD Take 237 mLs by mouth 3 (three) times daily between meals. 237 mL 12    nutrition supplement, JUVEN, (JUVEN) PACK Take 1 packet by mouth 2 (two) times daily between meals. 60 each 0    Semaglutide,0.25 or 0.5MG /DOS, (OZEMPIC, 0.25 OR 0.5 MG/DOSE,) 2 MG/3ML SOPN Inject 0.25 mg into the skin once a week. After four weeks increase to 0.5 mg weekly, then after four weeks increase to 0.75 mg weekly. (Patient not taking:  Reported on 04/22/2022)   Not Taking    Assessment: Pharmacy consulted to dose heparin in this 52 year old female admitted with DKA, now with arterial occlusion of lower extremity, was on Eliquis PTA for VTE prophylaxis.   Pt received Eliquis 2.5 mg PO X 1 on 3/29 @ 1122.  CrCl = 66.4 ml/min  3/30 @ 0405: aPTT= 37,  HL = 0.84  inc from 600 to 800 u/hr 3/30 @1253  aPTT= 121,  HL= 0.84,   dec from 800 to 700 u/hr 3/30 @ 2022 aPTT= 44, subtherapeutic @ 700 units/hr 3/31 @ 0357 aPTT=53, HL =0.26  750 units/hr > 900 u/hr 3/31 @ 1108 aPTT=45    Subtherapeutic,rate is still going at 750 units/hr per RN (never increased to 900 units/hr from previous order). Will increase to 900 units/hr now 3/31 @ 1817 aPTT = 55, subtherapeutic @ 900 units/hr 04/01 @ 0145 aPTT = 72, therapeutic X 1   Goal of Therapy:  Heparin level 0.3-0.7 units/ml aPTT 66 - 102  seconds Monitor platelets by anticoagulation protocol: Yes   Plan:  04/01 @ 0145: aPTT = 72,   HL = 0.23 - aPTT is therapeutic X 1 but still not correlating with HL  - Will continue pt on current rate and draw confirmation aPTT in 6 hrs on 4/1 @ 0800. - Will recheck HL on 04/02 with AM labs - Will use aPTT to guide dosing until correlating with HL >CBC daily - Will recheck HL with AM labs.   Adnan Vanvoorhis D, PharmD 04/26/2022,3:52 AM

## 2022-04-26 NOTE — Progress Notes (Signed)
Occupational Therapy Treatment Patient Details Name: Beth Carroll MRN: [id number] DOB: [birth year] Today's Date: 04/26/2022   History of present illness Pt is a 52 y/o F admitted on 04/21/22 after presenting with c/o vomiting & diarrhea. Upon arrival pt was somnolent, hypothermic, tachypneic, & hypotensive. Lab work consistent with DKA, pseudohyponatremia, AKI, AGMA, transaminitis, lactic acidosis, leukocytosis with L shift & possible UTI. Chest x-ray concerning for PNA. Pt is being treated for severe DKA & metabolic acidosis in the setting of suspected sepsis 2/2 suspected UTI versus intraabdominal infection with hypotension.   OT comments  Upon entering the room, pt supine in bed with family present in room but stepping out for therapeutic intervention. Session focused on functional transfers and self care. Pt rolling L <> R with min A and use of RW with max A to don LB pants in bed. Supine >sit with min guard to EOB and increased time. Pt reporting she feels good during session. Education regarding proper placement of slide board and pt leans to the L laterally and is able to place slide board with min A. Pt transfers self to recliner chair with min guard and min cuing for hand placement for safety. Pt takes rest while seated in recliner chair and then needs assistance with placement of board to return. Transfer back to bed from wheelchair is slightly uphill but pt is able to manage with min guard and encouragement. Sit >supine with min A for R LE to return to bed. OT did discuss increased difficulty with slide board on drop arm commode chair and discussed the possibility of also trying anterior posterior transfer onto [facility] from hospital bed. Pt continues to make progress towards OT goals.    Recommendations for follow up therapy are one component of a multi-disciplinary discharge planning process, led by the attending physician.  Recommendations may be updated based on patient status, additional  functional criteria and insurance authorization.    Assistance Recommended at Discharge Intermittent Supervision/Assistance  Patient can return home with the following  A lot of help with bathing/dressing/bathroom;Assistance with cooking/housework;Assist for transportation;Help with stairs or ramp for entrance;A little help with walking and/or transfers   Equipment Recommendations  Other (comment) (drop arm commode chair and slide board)       Precautions / Restrictions Precautions Precautions: Fall Restrictions Weight Bearing Restrictions: No       Mobility Bed Mobility Overal bed mobility: Needs Assistance Bed Mobility: Supine to Sit, Sit to Supine     Supine to sit: Supervision Sit to supine: Min assist   General bed mobility comments: min A for R LE to return to bed for sit >supine    Transfers Overall transfer level: Needs assistance   Transfers: Bed to chair/wheelchair/BSC            Lateral/Scoot Transfers: With slide board, Min assist       Balance Overall balance assessment: Needs assistance Sitting-balance support: Feet supported Sitting balance-Leahy Scale: Good                                     ADL either performed or assessed with clinical judgement   ADL Overall ADL's : Needs assistance/impaired                     Lower Body Dressing: Maximal assistance Lower Body Dressing Details (indicate cue type and reason): rolling L <> R and assistance to thread pants  onto B LEs and pull over hips Toilet Transfer: Minimal assistance Toilet Transfer Details (indicate cue type and reason): simulated                Extremity/Trunk Assessment Upper Extremity Assessment Upper Extremity Assessment: Generalized weakness   Lower Extremity Assessment Lower Extremity Assessment: Generalized weakness        Vision Patient Visual Report: No change from baseline            Cognition Arousal/Alertness:  Awake/alert Behavior During Therapy: WFL for tasks assessed/performed Overall Cognitive Status: Within Functional Limits for tasks assessed                                                     Pertinent Vitals/ Pain       Pain Assessment Pain Assessment: Faces Pain Location: R LE with mobility Pain Descriptors / Indicators: Sore, Discomfort Pain Intervention(s): Limited activity within patient's tolerance, Monitored during session, Repositioned         Frequency  Min 2X/week        Progress Toward Goals  OT Goals(current goals can now be found in the care plan section)  Progress towards OT goals: Progressing toward goals     Plan Discharge plan remains appropriate;Frequency remains appropriate       AM-PAC OT "6 Clicks" Daily Activity     Outcome Measure   Help from another person eating meals?: None Help from another person taking care of personal grooming?: None Help from another person toileting, which includes using toliet, bedpan, or urinal?: A Lot Help from another person bathing (including washing, rinsing, drying)?: A Lot Help from another person to put on and taking off regular upper body clothing?: A Little Help from another person to put on and taking off regular lower body clothing?: A Lot 6 Click Score: 17    End of Session    OT Visit Diagnosis: Dizziness and giddiness (R42);Muscle weakness (generalized) (M62.81);Other abnormalities of gait and mobility (R26.89);Pain   Activity Tolerance Patient tolerated treatment well   Patient Left in bed;with call bell/phone within reach;with bed alarm set;with family/visitor present   Nurse Communication Other (comment) (request for new purewick)        Time: [id number] OT Time Calculation (min): 35 min  Charges: OT General Charges $OT Visit: 1 Visit OT Treatments $Self Care/Home Management : 8-22 mins $Therapeutic Activity: 8-22 mins  Darleen Crocker, MS, OTR/L , CBIS ascom  [phone]  04/26/22, 4:22 PM

## 2022-04-26 NOTE — Progress Notes (Signed)
[place] for IV Heparin  Indication: arterial occlusion right lower extremity  Patient Measurements: Height: 5\' 6"  (167.6 cm) Weight: 54 kg (119 lb) IBW/kg (Calculated) : 59.3 Heparin Dosing Weight: 49.8 kg   Labs: Recent Labs    04/24/22 0405 04/24/22 1253 04/24/22 2022 04/25/22 0357 04/25/22 1108 04/25/22 1817 04/26/22 0145  HGB 9.2*  --   --  8.0*  --   --  8.2*  HCT 29.3*  --   --  26.6*  --   --  26.9*  PLT 189  --   --  165  --   --  165  APTT 37* 121*   < > 53* 45* 55* 72*  HEPARINUNFRC 0.84* 0.84*  --  0.26*  --   --  0.23*  CREATININE <0.30*  --   --  <0.30*  --   --  0.47   < > = values in this interval not displayed.    Estimated Creatinine Clearance: 70.1 mL/min (by C-G formula based on SCr of 0.47 mg/dL).  Medical History: Past Medical History:  Diagnosis Date   Coronary artery disease    Diabetes mellitus without complication    Diabetes mellitus, type 2    HFrEF (heart failure with reduced ejection fraction)    Hyperlipemia    Hypertension    Ischemic cardiomyopathy    Orthostatic hypotension    PAD (peripheral artery disease)    Tobacco use     Assessment: Pharmacy consulted to dose heparin in this 52 year old female admitted with DKA, now with arterial occlusion of lower extremity, was on Eliquis PTA for VTE prophylaxis.  Pt received Eliquis 2.5 mg PO X 1 on 3/29 @ 1122.  3/30 @ 0405: aPTT= 37,  HL = 0.84  inc from 600 to 800 u/hr 3/30 @1253  aPTT= 121,  HL= 0.84,   dec from 800 to 700 u/hr 3/30 @ 2022 aPTT= 44, subtherapeutic @ 700 units/hr 3/31 @ 0357 aPTT=53, HL =0.26  750 units/hr > 900 u/hr 3/31 @ 1108 aPTT=45    Subtherapeutic,rate is still going at 750 units/hr per RN (never increased to 900 units/hr from previous order). Will increase to 900 units/hr now 3/31 @ 1817 aPTT = 55, subtherapeutic @ 900 units/hr 04/01 @ 0145 aPTT = 72, therapeutic x 1 04/01 @ 0759 aPTT = 62, subtherapeutic  Goal of  Therapy:  Heparin level 0.3-0.7 units/ml aPTT 66 - 102  seconds Monitor platelets by anticoagulation protocol: Yes   Plan:  --aPTT subtherapeutic --Heparin 800 unit IV bolus and increase heparin infusion rate to 1100 units/hr --Switch over to HL monitoring. Check HL 6 hours from rate change --Daily CBC per protocol while on IV heparin  Benita Gutter 04/26/2022,9:04 AM

## 2022-04-26 NOTE — Inpatient Diabetes Management (Signed)
Inpatient Diabetes Program Recommendations  AACE/ADA: New Consensus Statement on Inpatient Glycemic Control (2015)  Target Ranges:  Prepandial:   less than 140 mg/dL      Peak postprandial:   less than 180 mg/dL (1-2 hours)      Critically ill patients:  140 - 180 mg/dL   Lab Results  Component Value Date   GLUCAP 200 (H) 04/26/2022   HGBA1C 9.8 (H) 04/23/2022    Review of Glycemic Control  Diabetes history: DM2 Outpatient Diabetes medications: Lantus 30 units QD, Novolog 8 units TID, Farxiga 10 mg QD, Ozempic weekly Current orders for Inpatient glycemic control: Novolog -20 TID  Inpatient Diabetes Program Recommendations:    Given UTI & sacral wounds, might consider discontinuing SGLT2-i at discharge.    Will continue to follow while inpatient.  Thank you, Reche Dixon, MSN, [place] Diabetes Coordinator Inpatient Diabetes Program [phone] (team pager from 8a-5p)

## 2022-04-26 NOTE — Progress Notes (Signed)
[place] for IV Heparin  Indication: arterial occlusion right lower extremity  Patient Measurements: Height: 5\' 6"  (167.6 cm) Weight: 54 kg (119 lb) IBW/kg (Calculated) : 59.3 Heparin Dosing Weight: 49.8 kg   Labs: Recent Labs    04/24/22 0405 04/24/22 1253 04/25/22 0357 04/25/22 1108 04/25/22 1817 04/26/22 0145 04/26/22 0759 04/26/22 1710  HGB 9.2*  --  8.0*  --   --  8.2*  --   --   HCT 29.3*  --  26.6*  --   --  26.9*  --   --   PLT 189  --  165  --   --  165  --   --   APTT 37*   < > 53*   < > 55* 72* 62*  --   HEPARINUNFRC 0.84*   < > 0.26*  --   --  0.23*  --  0.17*  CREATININE <0.30*  --  <0.30*  --   --  0.47  --   --    < > = values in this interval not displayed.    Estimated Creatinine Clearance: 70.1 mL/min (by C-G formula based on SCr of 0.47 mg/dL).  Medical History: Past Medical History:  Diagnosis Date   Coronary artery disease    Diabetes mellitus without complication    Diabetes mellitus, type 2    HFrEF (heart failure with reduced ejection fraction)    Hyperlipemia    Hypertension    Ischemic cardiomyopathy    Orthostatic hypotension    PAD (peripheral artery disease)    Tobacco use     Assessment: Pharmacy consulted to dose heparin in this 52 year old female admitted with DKA, now with arterial occlusion of lower extremity, was on Eliquis PTA for VTE prophylaxis.  Pt received Eliquis 2.5 mg PO X 1 on 3/29 @ 1122.  3/30 @ 0405: aPTT= 37,  HL = 0.84  inc from 600 to 800 u/hr 3/30 @1253  aPTT= 121,  HL= 0.84,   dec from 800 to 700 u/hr 3/30 @ 2022 aPTT= 44, subtherapeutic @ 700 units/hr 3/31 @ 0357 aPTT=53, HL =0.26  750 units/hr > 900 u/hr 3/31 @ 1108 aPTT=45    Subtherapeutic,rate is still going at 750 units/hr per RN (never increased to 900 units/hr from previous order). Will increase to 900 units/hr now 3/31 @ 1817 aPTT = 55, subtherapeutic @ 900 units/hr 04/01 @ 0145 aPTT = 72, therapeutic x 1 04/01 @  0759 aPTT = 62, subtherapeutic 04/01 @ 1710 HL = 0.17, subtherapeutic   Goal of Therapy:  Heparin level 0.3-0.7 units/ml aPTT 66 - 102  seconds Monitor platelets by anticoagulation protocol: Yes   Plan:  Heparin subtherapeutic  --Heparin 1500 unit IV bolus and increase heparin infusion rate to 1300 units/hr --Check HL 6 hours from rate change --Daily CBC per protocol while on IV heparin  Dorothe Pea, PharmD, BCPS Clinical Pharmacist   04/26/2022,6:00 PM

## 2022-04-26 NOTE — Progress Notes (Signed)
[place] for Apixaban Indication: arterial occlusion right lower extremity  Patient Measurements: Height: 5\' 6"  (167.6 cm) Weight: 54 kg (119 lb) IBW/kg (Calculated) : 59.3 Heparin Dosing Weight: 49.8 kg   Labs: Recent Labs    04/24/22 0405 04/24/22 1253 04/25/22 0357 04/25/22 1108 04/25/22 1817 04/26/22 0145 04/26/22 0759 04/26/22 1710  HGB 9.2*  --  8.0*  --   --  8.2*  --   --   HCT 29.3*  --  26.6*  --   --  26.9*  --   --   PLT 189  --  165  --   --  165  --   --   APTT 37*   < > 53*   < > 55* 72* 62*  --   HEPARINUNFRC 0.84*   < > 0.26*  --   --  0.23*  --  0.17*  CREATININE <0.30*  --  <0.30*  --   --  0.47  --   --    < > = values in this interval not displayed.    Estimated Creatinine Clearance: 70.1 mL/min (by C-G formula based on SCr of 0.47 mg/dL).  Medical History: Past Medical History:  Diagnosis Date   Coronary artery disease    Diabetes mellitus without complication    Diabetes mellitus, type 2    HFrEF (heart failure with reduced ejection fraction)    Hyperlipemia    Hypertension    Ischemic cardiomyopathy    Orthostatic hypotension    PAD (peripheral artery disease)    Tobacco use     Assessment: Pharmacy consulted to dose heparin in this 52 year old female admitted with DKA, now with arterial occlusion of lower extremity, was on Eliquis PTA for VTE prophylaxis.  Pt received Eliquis 2.5 mg PO X 1 on 3/29 @ 1122.  3/30 @ 0405: aPTT= 37,  HL = 0.84  inc from 600 to 800 u/hr 3/30 @1253  aPTT= 121,  HL= 0.84,   dec from 800 to 700 u/hr 3/30 @ 2022 aPTT= 44, subtherapeutic @ 700 units/hr 3/31 @ 0357 aPTT=53, HL =0.26  750 units/hr > 900 u/hr 3/31 @ 1108 aPTT=45    Subtherapeutic,rate is still going at 750 units/hr per RN (never increased to 900 units/hr from previous order). Will increase to 900 units/hr now 3/31 @ 1817 aPTT = 55, subtherapeutic @ 900 units/hr 04/01 @ 0145 aPTT = 72, therapeutic x 1 04/01 @  0759 aPTT = 62, subtherapeutic 04/01 @ 1710 HL = 0.17, subtherapeutic   Goal of Therapy:  Heparin level 0.3-0.7 units/ml aPTT 66 - 102  seconds Monitor platelets by anticoagulation protocol: Yes   Plan:  Pharmacy asked to resume PTA Apixaban. --Age < 69, Wt < 60 kg, SCr < 1.5  --Ordered Apixaban 5 mg BID --DC Heparin infusion --CBC & SCr at least weekly while inpatient.  Renda Rolls, PharmD, [facility] 04/26/2022 9:50 PM

## 2022-04-27 ENCOUNTER — Encounter: Payer: Self-pay | Admitting: Hospitalist

## 2022-04-27 DIAGNOSIS — E1110 Type 2 diabetes mellitus with ketoacidosis without coma: Secondary | ICD-10-CM | POA: Diagnosis not present

## 2022-04-27 LAB — BASIC METABOLIC PANEL
Anion gap: 4 — ABNORMAL LOW (ref 5–15)
BUN: 8 mg/dL (ref 6–20)
CO2: 28 mmol/L (ref 22–32)
Calcium: 8.6 mg/dL — ABNORMAL LOW (ref 8.9–10.3)
Chloride: 106 mmol/L (ref 98–111)
Creatinine, Ser: 0.32 mg/dL — ABNORMAL LOW (ref 0.44–1.00)
GFR, Estimated: >60 mL/min (ref >60)
Glucose, Bld: 158 mg/dL — ABNORMAL HIGH (ref 70–99)
Potassium: 3.3 mmol/L — ABNORMAL LOW (ref 3.5–5.1)
Sodium: 138 mmol/L (ref 135–145)

## 2022-04-27 LAB — GLUCOSE, CAPILLARY
Glucose-Capillary: 143 mg/dL — ABNORMAL HIGH (ref 70–99)
Glucose-Capillary: 177 mg/dL — ABNORMAL HIGH (ref 70–99)
Glucose-Capillary: 269 mg/dL — ABNORMAL HIGH (ref 70–99)
Glucose-Capillary: 193 mg/dL — ABNORMAL HIGH (ref 70–99)

## 2022-04-27 LAB — CBC
HCT: 25.9 % — ABNORMAL LOW (ref 36.0–46.0)
Hemoglobin: 8 g/dL — ABNORMAL LOW (ref 12.0–15.0)
MCH: 27.2 pg (ref 26.0–34.0)
MCHC: 30.9 g/dL (ref 30.0–36.0)
MCV: 88.1 fL (ref 80.0–100.0)
Platelets: 184 10*3/uL (ref 150–400)
RBC: 2.94 MIL/uL — ABNORMAL LOW (ref 3.87–5.11)
RDW: 15.4 % (ref 11.5–15.5)
WBC: 5.1 10*3/uL (ref 4.0–10.5)
nRBC: 0 % (ref 0.0–0.2)

## 2022-04-27 LAB — MAGNESIUM: Magnesium: 1.6 mg/dL — ABNORMAL LOW (ref 1.7–2.4)

## 2022-04-27 MED ORDER — INSULIN GLARGINE-YFGN 100 UNIT/ML ~~LOC~~ SOLN
22.0000 [IU] | Freq: Every day | SUBCUTANEOUS | Status: DC
  Administered 2022-04-27: 22 [IU] via SUBCUTANEOUS
  Filled 2022-04-27: qty 0.22

## 2022-04-27 MED ORDER — CARVEDILOL 3.125 MG PO TABS: ORAL_TABLET | ORAL | Status: DC

## 2022-04-27 MED ORDER — INSULIN GLARGINE-YFGN 100 UNIT/ML ~~LOC~~ SOLN
25.0000 [IU] | Freq: Every day | SUBCUTANEOUS | Status: DC
  Administered 2022-04-28: 25 [IU] via SUBCUTANEOUS
  Filled 2022-04-27: qty 0.25

## 2022-04-27 MED ORDER — ATORVASTATIN CALCIUM 80 MG PO TABS: 80.0000 mg | ORAL_TABLET | Freq: Every day | ORAL | 2 refills | Status: DC

## 2022-04-27 MED ORDER — CLOPIDOGREL BISULFATE 75 MG PO TABS: 75.0000 mg | ORAL_TABLET | Freq: Every day | ORAL | 2 refills | Status: AC

## 2022-04-27 MED ORDER — HYDROCHLOROTHIAZIDE 25 MG PO TABS: ORAL_TABLET | ORAL | Status: DC

## 2022-04-27 MED ORDER — MAGNESIUM SULFATE 4 GM/100ML IV SOLN
4.0000 g | Freq: Once | INTRAVENOUS | Status: AC
  Administered 2022-04-27: 4 g via INTRAVENOUS
  Filled 2022-04-27: qty 100

## 2022-04-27 MED ORDER — CALCIUM CARBONATE ANTACID 500 MG PO CHEW
1.0000 | CHEWABLE_TABLET | Freq: Three times a day (TID) | ORAL | Status: DC
  Administered 2022-04-27 – 2022-04-28 (×2): 200 mg via ORAL
  Filled 2022-04-27 (×2): qty 1

## 2022-04-27 MED ORDER — POTASSIUM CHLORIDE CRYS ER 20 MEQ PO TBCR
40.0000 meq | EXTENDED_RELEASE_TABLET | Freq: Once | ORAL | Status: AC
  Administered 2022-04-27: 40 meq via ORAL
  Filled 2022-04-27: qty 2

## 2022-04-27 MED ORDER — INSULIN GLARGINE-YFGN 100 UNIT/ML ~~LOC~~ SOLN: 25.0000 [IU] | Freq: Every day | SUBCUTANEOUS | Status: DC

## 2022-04-27 MED ORDER — GERHARDT'S BUTT CREAM: 1.0000 | TOPICAL_CREAM | Freq: Two times a day (BID) | CUTANEOUS | 0 refills | Status: AC

## 2022-04-27 NOTE — Plan of Care (Signed)
Stable for discharge.  [place] Souter V Wenda Vanschaick  

## 2022-04-27 NOTE — Progress Notes (Signed)
PROGRESS NOTE    Beth Carroll  [id number] DOB: 1970/11/17 DOA: 04/21/2022 PCP: Baxter Hire, MD  [id number]  LOS: 6 days   Brief hospital course:   Assessment & Plan: Beth Carroll is a 52 year old female with hx significant for severe PAD, DM2 who presented for evaluation of weakness.   Tuesday (3/26) she began having "continuous vomiting" and multiple episodes of diarrhea. She has not taken any insulin in the last 2 days PTA due to the nausea vomiting and diarrhea.  She is currently receiving at home physical therapy in order to ambulate with a walker but is otherwise wheelchair-bound.   Admit to ICU with severe DKA and metabolic acidosis with hypotension and potential need for vasopressor support.  Pt did not need pressor, and was transferred to [facility] on 04/23/22.  Diabetic ketoacidosis  Severe AGMA Insulin non-compliance PMHx: poorly controlled T2DM --off insulin gtt and transitioned to subQ insulin around 11 am on 3/28. --increase glargine to home 25u daily --ACHS and SSI  Suspected Sepsis  Suspected UTI --leukocytosis, tachypnea.  Started on vanc/cefepime on presentation, and transitioned to ceftriaxone for UTI, however, no urine culture sent.  CXR showed mild Heterogeneous opacities in the left mid and lower lung, however, pt does not have symptoms of PNA. --pt continued to complained of dysuria even after 3 days of IV abx --completed ceftriaxone 5 days of ceftriaxone.  Urine cx collected after starting abx showed insignificant growth.  Pt advised that if she continues to have dysuria after having received abx and neg urine cx, then it's not likely to be due to UTI.  Nausea/ Vomiting/ Diarrhea --CT abdomen/pelvis wo contrast no acute finding. - GI panel PCR & C-diff both neg --encourage oral hydration   Acute Kidney Injury suspect pre-renal secondary to hypovolemia in the setting of DKA, vomiting/diarrhea Baseline Cr: 0.47, Cr on admission:1.54 --Cr back to  baseline after IVF   PAD with recurrent right superficial femoral, right common femoral, popliteal artery occlusion  S/p percutaneous angioplasty and thrombectomy of the right posterior tibial artery on 3/30 --transition from heparin to Eliquis  --cont ASA, plavix (new) and Eliquis, will discharge on triple therapy to be taken indefinitely, per vascular surgery --cont statin at increased dose of 80 mg daily --f/u in vascular clinic in 1 month with bilateral lower ultrasounds with ABIs.   Sanguinous bulla noted encompassing the right great toe  --and forefoot as well as the posterior heels  --podiatry consult today -Currently no emergent need for amputation. Allow for tissue demarcation  --re-consult wound care RN tomorrow --cont outpatient f/u with [facility] wound care center  Transaminitis, ruled out --alk phos 175, mildly elevated  Chronic ICM, HFrEF, CAD s/p PCI  --cont home ASA --hold home coreg due to hypotension   COPD PMHx: former smoker --not in exacerbation --cont Spiriva   Chronic Wounds- sacrum & bilateral heels - consulted WOC - utilize offloading devices, Turn Q 2 - optimize nutrition once stabilized --wound care per order  Hypophos --monitor and replete PRN   DVT prophylaxis: [id number] Code Status: Full code  Family Communication: husband updated at bedside today Level of care: Med-Surg Dispo:   The patient is from: home Anticipated d/c is to: home Anticipated d/c date is: tomorrow   Subjective and Interval History:  Pt reported blisters forming around her right big toe, no pain.    Podiatry consulted today.   Objective: Vitals:   04/27/22 0500 04/27/22 0758 04/27/22 1721 04/27/22 1927  BP: 119/68 134/70 138/81 137/81  Pulse: 73 75 78 75  Resp: 16 20 18 20   Temp: 98.2 F (36.8 C) 98.2 F (36.8 C) 98.2 F (36.8 C) 98.2 F (36.8 C)  TempSrc: Oral  Oral   SpO2: 100% 100% 98% 100%  Weight: 51.9 kg     Height:        Intake/Output Summary  (Last 24 hours) at 04/27/2022 2012 Last data filed at 04/27/2022 1438 Gross per 24 hour  Intake 819.4 ml  Output --  Net 819.4 ml   Filed Weights   04/25/22 0427 04/26/22 0444 04/27/22 0500  Weight: 53.1 kg 54 kg 51.9 kg    Examination:   Constitutional: NAD, AAOx3 HEENT: conjunctivae and lids normal, EOMI CV: No cyanosis.   RESP: normal respiratory effort, on RA Extremities: RLE and right foot swollen, right great toe with layer of blister.  Right 2nd toe detached. SKIN: warm, dry Neuro: II - XII grossly intact.   Psych: Normal mood and affect.  Appropriate judgement and reason     Data Reviewed: I have personally reviewed labs and imaging studies  Time spent: 50 minutes  Enzo Bi, MD [facility] If 7PM-7AM, please contact night-coverage 04/27/2022, 8:12 PM

## 2022-04-27 NOTE — Consult Note (Signed)
   PODIATRY CONSULTATION  NAME Beth Carroll MRN [id number] DOB [birth year] DOA 04/21/2022   Reason for consult: PVD RLE Chief Complaint  Patient presents with   Hyperglycemia    Consulting physician: Dr. Enzo Bi, MD  History of present illness: 52 y.o. female admitted 04/21/2022 as/P aortogram with selective right lower extremity angiogram and angioplasty of the right posterior tibial artery performed 04/24/2022.  Podiatry consulted for possible amputation of the forefoot  Past Medical History:  Diagnosis Date   Coronary artery disease    Diabetes mellitus without complication    Diabetes mellitus, type 2    HFrEF (heart failure with reduced ejection fraction)    Hyperlipemia    Hypertension    Ischemic cardiomyopathy    Orthostatic hypotension    PAD (peripheral artery disease)    Tobacco use        Latest Ref Rng & Units 04/27/2022    6:35 AM 04/26/2022    1:45 AM 04/25/2022    3:57 AM  CBC  WBC 4.0 - 10.5 K/uL 5.1  5.8  5.9   Hemoglobin 12.0 - 15.0 g/dL 8.0  8.2  8.0   Hematocrit 36.0 - 46.0 % 25.9  26.9  26.6   Platelets 150 - 400 K/uL 184  165  165        Latest Ref Rng & Units 04/27/2022    6:35 AM 04/26/2022    1:45 AM 04/25/2022    3:57 AM  BMP  Glucose 70 - 99 mg/dL 158  238  158   BUN 6 - 20 mg/dL 8  12  11    Creatinine 0.44 - 1.00 mg/dL 0.32  0.47  <0.30   Sodium 135 - 145 mmol/L 138  138  138   Potassium 3.5 - 5.1 mmol/L 3.3  3.7  3.3   Chloride 98 - 111 mmol/L 106  106  104   CO2 22 - 32 mmol/L 28  19  24    Calcium 8.9 - 10.3 mg/dL 8.6  8.5  8.4     B/L feet 04/27/2022   Physical Exam: Skin warm to touch right lower extremity.  Sanguinous bulla noted encompassing the right great toe and forefoot as well as the posterior heels.  Overall the foot appears somewhat stable.  No malodor.  Clinically there is no indication of purulence or emergent infection   ASSESSMENT/PLAN OF CARE S/RLE angiogram with angioplasty 04/24/2022  -Patient evaluated  -Agree with  vein and vascular plan for discharge to home/rehab family preference.  Patient established with [facility] wound care center.  Recommend follow-up with the wound care center for management of the forefoot wounds as well as her sacral wound. -Currently no emergent need for amputation.  Allow for tissue demarcation -Podiatry will sign off.     Thank you for the consult.  Please contact me directly via secure chat with any questions or concerns.     Edrick Kins, DPM [facility]  Dr. Edrick Kins, DPM    [address], [place] [address]                Office [phone]  Fax [phone]

## 2022-04-27 NOTE — Progress Notes (Signed)
Progress Note    04/27/2022 9:13 AM 3 Days Post-Op  Subjective:  52 year old female presenting to [facility] ED from home via EMS on 04/21/2022 for evaluation of weakness. She is currently receiving at home physical therapy in order to ambulate with a walker but is otherwise wheelchair-bound.  On 04/24/2022 patient underwent aortogram with selective right lower extremity angiogram.  She had a percutaneous angioplasty of the right posterior tibial artery.  She also had mechanical thrombectomy of the right common femoral artery, superficial femoral distribution, popliteal and right deep femoral origin.   On exam this morning patient is resting comfortably in bed.  Right lower extremity is warm to touch.  Patient has no complaints.  Patient states she feels much better and her right leg feels better.  Continues to work with PT.  Patient endorses only standing and not walking at this point in time. Vitals all remained stable   Vitals:   04/27/22 0500 04/27/22 0758  BP: 119/68 134/70  Pulse: 73 75  Resp: 16 20  Temp: 98.2 F (36.8 C) 98.2 F (36.8 C)  SpO2: 100% 100%   Physical Exam: Cardiac:  RRR Lungs:  Clear on auscultation throughout normal respiratory effort nonproductive cough.  Incisions:  Left groin with incision dressing clean dry and intact.  Extremities:  Bilateral lower extremities are symmetrically warm to touch and well-perfused. She still continues to have slight discoloration distally on her right toes only.  Abdomen:  Positive bowel sounds all 4 quadrants, soft, flat, nondistended, nontender.  Neurologic:  No oriented x 4, neuro intact, follows all commands appropriately.    CBC    Component Value Date/Time   WBC 5.1 04/27/2022 0635   RBC 2.94 (L) 04/27/2022 0635   HGB 8.0 (L) 04/27/2022 0635   HCT 25.9 (L) 04/27/2022 0635   PLT 184 04/27/2022 0635   MCV 88.1 04/27/2022 0635   MCH 27.2 04/27/2022 0635   MCHC 30.9 04/27/2022 0635   RDW 15.4 04/27/2022 0635   LYMPHSABS  1.5 04/21/2022 2038   MONOABS 0.6 04/21/2022 2038   EOSABS 0.0 04/21/2022 2038   BASOSABS 0.0 04/21/2022 2038    BMET    Component Value Date/Time   NA 138 04/27/2022 0635   NA 137 03/18/2021 1541   K 3.3 (L) 04/27/2022 0635   CL 106 04/27/2022 0635   CO2 28 04/27/2022 0635   GLUCOSE 158 (H) 04/27/2022 0635   BUN 8 04/27/2022 0635   BUN 15 03/18/2021 1541   CREATININE 0.32 (L) 04/27/2022 0635   CALCIUM 8.6 (L) 04/27/2022 0635   GFRNONAA >60 04/27/2022 0635    INR    Component Value Date/Time   INR 1.5 (H) 08/19/2021 2107     Intake/Output Summary (Last 24 hours) at 04/27/2022 0913 Last data filed at 04/27/2022 0100 Gross per 24 hour  Intake 579.4 ml  Output --  Net 579.4 ml     Assessment/Plan:  52 y.o. female is s/p aortogram with selective right lower extremity angiogram.  She had a percutaneous angioplasty and thrombectomy of the right posterior tibial artery.   3 Days Post-Op   PLAN: Per vascular surgery once patient is stable on oral anticoagulation patient may be discharged to home/rehab family preference. We will see the patient back in clinic in 1 month with bilateral lower ultrasounds with ABIs.  Patient will be discharged on ASA 81 mg daily, Plavix 75 mg Daily and Eliquis 5 MG Twice daily.   I discussed the plan in detail with Dr Corene Cornea  Dew and he is in agreement with the plan.    Drema Pry [facility] 04/27/2022 9:13 AM

## 2022-04-28 DIAGNOSIS — I739 Peripheral vascular disease, unspecified: Secondary | ICD-10-CM

## 2022-04-28 DIAGNOSIS — R112 Nausea with vomiting, unspecified: Secondary | ICD-10-CM | POA: Diagnosis not present

## 2022-04-28 DIAGNOSIS — N179 Acute kidney failure, unspecified: Secondary | ICD-10-CM

## 2022-04-28 DIAGNOSIS — T148XXA Other injury of unspecified body region, initial encounter: Secondary | ICD-10-CM | POA: Diagnosis not present

## 2022-04-28 DIAGNOSIS — R197 Diarrhea, unspecified: Secondary | ICD-10-CM

## 2022-04-28 LAB — BASIC METABOLIC PANEL
Anion gap: 5 (ref 5–15)
BUN: 8 mg/dL (ref 6–20)
CO2: 27 mmol/L (ref 22–32)
Calcium: 8.5 mg/dL — ABNORMAL LOW (ref 8.9–10.3)
Chloride: 109 mmol/L (ref 98–111)
Creatinine, Ser: 0.3 mg/dL — ABNORMAL LOW (ref 0.44–1.00)
GFR, Estimated: >60 mL/min (ref >60)
Glucose, Bld: 123 mg/dL — ABNORMAL HIGH (ref 70–99)
Potassium: 3 mmol/L — ABNORMAL LOW (ref 3.5–5.1)
Sodium: 141 mmol/L (ref 135–145)

## 2022-04-28 LAB — GLUCOSE, CAPILLARY: Glucose-Capillary: 149 mg/dL — ABNORMAL HIGH (ref 70–99)

## 2022-04-28 LAB — CBC
HCT: 28 % — ABNORMAL LOW (ref 36.0–46.0)
Hemoglobin: 8.3 g/dL — ABNORMAL LOW (ref 12.0–15.0)
MCH: 26.3 pg (ref 26.0–34.0)
MCHC: 29.6 g/dL — ABNORMAL LOW (ref 30.0–36.0)
MCV: 88.9 fL (ref 80.0–100.0)
Platelets: 232 10*3/uL (ref 150–400)
RBC: 3.15 MIL/uL — ABNORMAL LOW (ref 3.87–5.11)
RDW: 15.8 % — ABNORMAL HIGH (ref 11.5–15.5)
WBC: 4.9 10*3/uL (ref 4.0–10.5)
nRBC: 0 % (ref 0.0–0.2)

## 2022-04-28 LAB — MAGNESIUM: Magnesium: 1.8 mg/dL (ref 1.7–2.4)

## 2022-04-28 MED ORDER — CALCIUM CARBONATE ANTACID 500 MG PO CHEW: 1.0000 | CHEWABLE_TABLET | Freq: Three times a day (TID) | ORAL | Status: DC

## 2022-04-28 MED ORDER — GERHARDT'S BUTT CREAM
TOPICAL_CREAM | Freq: Three times a day (TID) | CUTANEOUS | Status: DC
  Filled 2022-04-28: qty 1

## 2022-04-28 MED ORDER — GERHARDT'S BUTT CREAM: 1.0000 | TOPICAL_CREAM | Freq: Three times a day (TID) | CUTANEOUS | 1 refills | Status: DC

## 2022-04-28 NOTE — Consult Note (Addendum)
[place] Nurse wound follow up; patient has been seen by vascular surgeon and podiatry  Wound type: Deep Tissue Injury Bilateral heels, Moisture Associated Skin Damage buttocks/sacrum Measurement: 1.  Left heel 1 cm x 1 cm purple maroon discoloration (Deep Tissue Pressure Injury) skin intact  2.  R heel former Deep Tissue Pressure Injury that has now evolved to Stage 2 Pressure Injury 100% pink and moist  3.  Sacrum/buttocks with areas of partial thickness skin loss related to Moisture Associated Skin Damage.  Buttocks have appearance of chronic tissue damage with purple red discoloration  ICD-10 CM Codes for Irritant Dermatitis L24A2 - Due to fecal, urinary or dual incontinence 4.  R great toe intact bulla, right 2nd digit with loose nail and partial thickness skin loss, intact serous bulla to dorsum of right foot  Drainage (amount, consistency, odor) minimal sanguinous from R heel; minimal serous from right 2nd digit  Periwound: R great and 2nd digit noted to be purplish in color, dry scaling skin, long loose toenails noted on 2nd and 5th digits  Dressing procedure/placement/frequency:  R heel clean with NS daily, cover with single layer Xeroform gauze Kellie Simmering [phone]), place in foam heel protectors to secure dressing then place foot in Prevalon boot.   L heel place in Prevalon boot.  Ordered Gerhardts Butt Cream for buttocks and sacrum, can cover open areas with silicone foam to protect.  R great, 2nd digit and R dorsal foot bulla clean with NS, apply a single layer of Xeroform gauze Kellie Simmering [phone]) to areas and secure with roll gauze and tape.   Patient states she has a home health nurse who comes out to her house and also plans to follow with wound care center for these wounds after discharge.    Long discussion with patient that the most important thing for her heels is to keep the pressure off of them.  Patient had Prevalon boots in room when this nurse arrived but had her heels propped up on a  pillow.  I educated patient that this DOES NOT relieve any pressure off her heels and that she must wear Prevalon boots to offload the pressure on her heels.  Patient states she does not like anything on her feet but did voice understanding of the importance of the Prevalon boots.  Placed patients feet in Prevalon boots after wound care at this visit.     POC discussed with patient and bedside nurse.  [place] team will not follow at this time.  Re-consult if further needs arise.   Thank you,    Shelton Silvas MSN, RN-BC, [employer] [phone]

## 2022-04-28 NOTE — Discharge Summary (Addendum)
Physician Discharge Summary   Patient: Beth Carroll MRN: [id number] DOB: February 13, 1970  Admit date:     04/21/2022  Discharge date: 04/30/22  Discharge Physician: Pennie Banter   PCP: Gracelyn Nurse, MD   Recommendations at discharge:   Follow up at Wound Care clinic Follow up with Primary Care in 1-2 weeks Repeat CBC, BMP, Mg in 1-2 weeks Monitor left great toe & ongoing wound care Follow up with Vascular Surgery in 1 month  Discharge Diagnoses: Principal Problem:   DKA (diabetic ketoacidosis) Active Problems:   PAD (peripheral artery disease)   COPD (chronic obstructive pulmonary disease)   Chronic wound  Resolved Problems:   High anion gap metabolic acidosis   Sepsis secondary to UTI   Nausea vomiting and diarrhea   AKI (acute kidney injury)   Hypophosphatemia  Hospital Course:  Beth Carroll is a 52 year old female with hx significant for severe PAD, DM2 who presented for evaluation of weakness.    Tuesday (3/26) she began having "continuous vomiting" and multiple episodes of diarrhea. She has not taken any insulin in the last 2 days PTA due to the nausea vomiting and diarrhea.  She is currently receiving at home physical therapy in order to ambulate with a walker but is otherwise wheelchair-bound.    Admit to ICU with severe DKA and metabolic acidosis with hypotension and potential need for vasopressor support.  Pt did not need pressor, and was transferred to [facility] on 04/23/22.  Further hospital course and management as outlined below.   Assessment and Plan:  Diabetic ketoacidosis  Severe AGMA Insulin non-compliance PMHx: poorly controlled T2DM --off insulin gtt and transitioned to subQ insulin around 11 am on 3/28. --increase glargine to home 25u daily --ACHS and SSI   Sepsis due to UTI --leukocytosis, tachypnea.  Started on vanc/cefepime on presentation, and transitioned to ceftriaxone for UTI, however, no urine culture sent.  CXR showed mild Heterogeneous  opacities in the left mid and lower lung, however, pt does not have symptoms of PNA. --pt continued to complained of dysuria even after 3 days of IV abx --completed ceftriaxone 5 days of ceftriaxone.  Urine cx collected after starting abx showed insignificant growth.  Pt advised that if she continues to have dysuria after having received abx and neg urine cx, then it's not likely to be due to UTI.   Nausea/ Vomiting/ Diarrhea --CT abdomen/pelvis wo contrast no acute finding. - GI panel PCR & C-diff both neg --encourage oral hydration   Acute Kidney Injury suspect pre-renal secondary to hypovolemia in the setting of DKA, vomiting/diarrhea Baseline Cr: 0.47, Cr on admission:1.54 --Cr back to baseline after IVF   PAD with recurrent right superficial femoral, right common femoral, popliteal artery occlusion  S/p percutaneous angioplasty and thrombectomy of the right posterior tibial artery on 3/30 --transition from heparin to Eliquis  --cont ASA, plavix (new) and Eliquis, will discharge on triple therapy to be taken indefinitely, per vascular surgery --cont statin at increased dose of 80 mg daily --f/u in vascular clinic in 1 month with bilateral lower ultrasounds with ABIs.    Sanguinous bulla noted encompassing the right great toe  --and forefoot as well as the posterior heels  --podiatry consult today -Currently no emergent need for amputation. Allow for tissue demarcation  --Wound care RN consulted --cont outpatient f/u with [facility] wound care center   Transaminitis, ruled out --alk phos 175, mildly elevated   Chronic ICM, HFrEF, CAD s/p PCI  --cont home ASA --hold home coreg due  to hypotension   COPD PMHx: former smoker --not in exacerbation --cont Spiriva   Chronic Wounds- sacrum & bilateral heels - POA - consulted WOC - utilize offloading devices, Turn Q 2 - optimize nutrition once stabilized --[facility]ound [facility]  Pressure Injury 04/22/22  Buttocks Right;Left;Medial Deep Tissue Pressure Injury - Purple or maroon localized area of discolored intact skin or blood-filled blister due to damage of underlying soft tissue from pressure and/or shear. Large, red, purple are (Active)  04/22/22 0100  Location: Buttocks  Location Orientation: Right;Left;Medial  Staging: Deep Tissue Pressure Injury - Purple or maroon localized area of discolored intact skin or blood-filled blister due to damage of underlying soft tissue from pressure and/or shear.  Wound Description (Comments): Large, red, purple area with open areas noted  Present on Admission: Yes     Pressure Injury 04/22/22 Heel Left Deep Tissue Pressure Injury - Purple or maroon localized area of discolored intact skin or blood-filled blister due to damage of underlying soft tissue from pressure and/or shear. (Active)  04/22/22 0100  Location: Heel  Location Orientation: Left  Staging: Deep Tissue Pressure Injury - Purple or maroon localized area of discolored intact skin or blood-filled blister due to damage of underlying soft tissue from pressure and/or shear.  Wound Description (Comments):   Present on Admission: Yes     Pressure Injury 04/22/22 Heel Right Deep Tissue Pressure Injury - Purple or maroon localized area of discolored intact skin or blood-filled blister due to damage of underlying soft tissue from pressure and/or shear. (Active)  04/22/22 0100  Location: Heel  Location Orientation: Right  Staging: Deep Tissue Pressure Injury - Purple or maroon localized area of discolored intact skin or blood-filled blister due to damage of underlying soft tissue from pressure and/or shear.  Wound Description (Comments):   Present on Admission: Yes       Hypophos --resolved with replacment       Consultants: Vascular surgery, wound care, PCCM Procedures performed:   Disposition: Home health Diet recommendation:  Cardiac and Carb modified diet DISCHARGE  MEDICATION: Allergies as of 04/28/2022   No Known Allergies      Medication List     STOP taking these medications    dapagliflozin propanediol 10 MG Tabs tablet Commonly known as: FARXIGA   ibuprofen 600 MG tablet Commonly known as: ADVIL   Ozempic (0.25 or 0.5 MG/DOSE) 2 MG/3ML Sopn Generic drug: Semaglutide(0.25 or 0.5MG /DOS)       TAKE these medications    acetaminophen 650 MG CR tablet Commonly known as: TYLENOL Take 650 mg by mouth every 8 (eight) hours as needed for pain.   albuterol 108 (90 Base) MCG/ACT inhaler Commonly known as: VENTOLIN HFA Inhale 2 puffs into the lungs every 6 (six) hours as needed for wheezing or shortness of breath.   aspirin EC 81 MG tablet Take 81 mg by mouth daily. Swallow whole.   atorvastatin 80 MG tablet Commonly known as: LIPITOR Take 1 tablet (80 mg total) by mouth daily. What changed:  medication strength how much to take   barrier cream Crea Commonly known as: non-specified Apply 1 Application topically 2 (two) times daily as needed.   calcium carbonate 500 MG chewable tablet Commonly known as: TUMS - dosed in mg elemental calcium Chew 1 tablet (200 mg of elemental calcium total) by mouth 3 (three) times daily before meals.   carvedilol 3.125 MG tablet Commonly known as: COREG Hold until followup with outpatient  provider due to intermittent low blood pressure. What changed:  how much to take how to take this when to take this additional instructions   CENTRUM SILVER 50+WOMEN PO Take 1 tablet by mouth daily.   clopidogrel 75 MG tablet Commonly known as: PLAVIX Take 1 tablet (75 mg total) by mouth daily.   Eliquis 5 MG Tabs tablet Generic drug: apixaban TAKE 1 TABLET BY MOUTH TWICE A DAY   feeding supplement Liqd Take 237 mLs by mouth 3 (three) times daily between meals.   nutrition supplement (JUVEN) Pack Take 1 packet by mouth 2 (two) times daily between meals.   Gerhardt's butt cream Crea Apply 1  Application topically 2 (two) times daily. What changed: Another medication with the same name was added. Make sure you understand how and when to take each.   Gerhardt's butt cream Crea Apply 1 Application topically 3 (three) times daily. Clean sacrum/buttocks with soap and water, apply Gerhardts Butt Cream three times daily and prn soiling. What changed: You were already taking a medication with the same name, and this prescription was added. Make sure you understand how and when to take each.   hydrochlorothiazide 25 MG tablet Commonly known as: HYDRODIURIL Hold until followup with outpatient provider due to intermittent low blood pressure. What changed:  how much to take how to take this when to take this additional instructions   Lantus SoloStar 100 UNIT/ML Solostar Pen Generic drug: insulin glargine Inject 25 Units into the skin daily.   nystatin cream Commonly known as: MYCOSTATIN Apply 1 Application topically 2 (two) times daily.   pantoprazole 40 MG tablet Commonly known as: PROTONIX Take 1 tablet (40 mg total) by mouth daily.   tiotropium 18 MCG inhalation capsule Commonly known as: SPIRIVA Place 1 capsule into inhaler and inhale daily.   traMADol 50 MG tablet Commonly known as: ULTRAM Take 50 mg by mouth every 6 (six) hours as needed for severe pain or moderate pain.   Vitamin D (Ergocalciferol) 1.25 MG (50000 UNIT) Caps capsule Commonly known as: DRISDOL Take 50,000 Units by mouth once a week.               Discharge Care Instructions  (From admission, onward)           Start     Ordered   04/28/22 0000  Discharge wound care:       Comments: As above   04/28/22 1041   04/27/22 0000  Discharge wound care:       Comments: Wound care to buttocks, sacrum: cleanse twice daily, place silicone foam for sacrum and other silicone foam dressings to cover other full and partial thickness areas of skin loss.  Turn side to side and minimize time in the  supine position.  To the right toe with nail hanging off, apply iodine solution to clean and then wrap with gauze and dressing. - -   04/27/22 0854            Follow-up Information     Georgiana Spinner, NP. Go on 05/27/2022.   Specialty: Vascular Surgery Why: Appointment on 05/27/22 @ 8:30 am Contact information: [address] [place] [address] [phone]         Gracelyn Nurse, MD Follow up on 05/04/2022.   Specialty: Internal Medicine Why: Tried to make an appointment. Please call and make an appointment 1 week from today (discharge). Go at 2:30pm you will see PA Rob Tumey. Contact information: [address] [place] [id number] [phone]         [facility]. Go to.   Specialty: Wound Care Why: The office will call you for your appointment. Contact information: [address] [id number] [phone]               Discharge Exam: Ceasar MonsFiled Weights   04/26/22 0444 04/27/22 0500 04/28/22 0457  Weight: 54 kg 51.9 kg 52.2 kg   General exam: awake, alert, no acute distress, frail, chronically ill appearing HEENT: moist mucus membranes, hearing grossly normal  Respiratory system: CTAB, no wheezes, rales or rhonchi, normal respiratory effort. Cardiovascular system: normal S1/S2, RRR  Gastrointestinal system: soft, NT, ND Central nervous system: A&O x3. no gross focal neurologic deficits, normal speech Extremities: right foot gauze dressing in place, no edema, normal tone Skin: dry, intact, normal temperature Psychiatry: normal mood, congruent affect, judgement and insight appear normal   Condition at discharge: stable  The results of significant diagnostics from this hospitalization (including imaging, microbiology, ancillary and laboratory) are listed below for reference.   Imaging Studies: PERIPHERAL VASCULAR CATHETERIZATION  Result Date: 04/24/2022 See surgical note  for result.  US ARTERIAL LOWER EXTREMITY DUPLEX RIGHT(NON-ABI)  Result Date: 04/23/2022 CLINICAL DATA:  Right leg and foot pain, no distal pulses EXAM: RIGHT LOWER EXTREMITY ARTERIAL DUPLEX SCAN TECHNIQUE: Gray-scale sonography as well as color Doppler and duplex ultrasound was performed to evaluate the lower extremity arteries including the common, superficial and profunda femoral arteries, popliteal artery and calf arteries. COMPARISON:  Ultrasound examination from 02/03/2023 as well as prior arteriogram from 08/19/2021. FINDINGS: Right lower Extremity ABI: Not obtained Inflow: Common femoral artery demonstrates extensive atherosclerotic plaque without focal stenosis. Monophasic waveforms are noted. This is similar to that seen on prior arteriography. Outflow: A stent is identified which extends from the proximal superficial femoral artery into the popliteal artery. The stent is thrombosed at its origin and throughout its course. Profunda femoral artery shows monophasic waveform. Runoff: Anterior tibial artery demonstrates monophasic waveform with decreased velocities consistent with the more proximal stent occlusion. Posterior tibial artery demonstrates monophasic waveform also related to the proximal stent occlusion. These are likely being fed by muscular collaterals. posterior and anterior tibial arterial waveforms and velocities. Vessels are patent to the ankle. IMPRESSION: Occluded femoral and popliteal arterial stent with distal monophasic waveforms consistent with the lack of significant inflow. Electronically Signed   By: Alcide CleverMark  Lukens M.D.   On: 04/23/2022 23:16   CT ABDOMEN PELVIS WO CONTRAST  Result Date: 04/22/2022 CLINICAL DATA:  Sepsis EXAM: CT ABDOMEN AND PELVIS WITHOUT CONTRAST TECHNIQUE: Multidetector CT imaging of the abdomen and pelvis was performed following the standard protocol without IV contrast. RADIATION DOSE REDUCTION: This exam was performed according to the departmental  dose-optimization program which includes automated exposure control, adjustment of the mA and/or kV according to patient size and/or use of iterative reconstruction technique. COMPARISON:  None Available. FINDINGS: Lower chest: Lung bases are clear. Old rib fractures. Dilated fluid-filled distal esophagus suggesting reflux or dysmotility. No obstructing mass lesion is identified. Hepatobiliary: No focal liver abnormality is seen. Status post cholecystectomy. No biliary dilatation. Pancreas: Unremarkable. No pancreatic ductal dilatation or surrounding inflammatory changes. Spleen: Normal in size without focal abnormality. Adrenals/Urinary Tract: No adrenal gland nodules. Central calcifications in both kidneys consistent with vascular calcifications. No renal, ureteral, or bladder stones. No hydronephrosis or hydroureter. Bladder wall is diffusely thickened, possibly indicating outlet obstruction or cystitis. Correlate with urinalysis.  Stomach/Bowel: Stomach is fluid-filled without wall thickening, likely physiologic. Small bowel and colon are mostly decompressed. No wall thickening or inflammatory changes are appreciated. Prominent stool in the rectum with some rectal wall thickening may indicate stercoral colitis. No mesenteric collections are identified. Vascular/Lymphatic: Aortic atherosclerosis. No enlarged abdominal or pelvic lymph nodes. Reproductive: Uterus and bilateral adnexa are unremarkable. Other: No free air or free fluid in the abdomen. Abdominal wall musculature appears intact. Gas bubbles in the anterior abdominal wall soft tissues likely represent injection sites. Diffuse subcutaneous soft tissue edema. Musculoskeletal: No acute bony abnormalities. IMPRESSION: 1. No loculated collection to suggest abscess and no inflammatory changes are appreciated. 2. Fluid-filled lower esophagus and stomach likely representing reflux disease or dysmotility. 3. Aortic atherosclerosis. Electronically Signed   By:  Burman Nieves M.D.   On: 04/22/2022 01:06   DG Chest Portable 1 View  Result Date: 04/21/2022 CLINICAL DATA:  Weakness EXAM: PORTABLE CHEST 1 VIEW COMPARISON:  03/19/2022 FINDINGS: Heterogeneous opacities in the left mid and lower lung. Normal cardiac size. No pneumothorax. IMPRESSION: Heterogeneous opacities in the left mid and lower lung suspicious for pneumonia. Radiographic follow-up to resolution recommended. Electronically Signed   By: Jasmine Pang M.D.   On: 04/21/2022 23:22    Microbiology: Results for orders placed or performed during the hospital encounter of 04/21/22  Blood Culture (routine x 2)     Status: None   Collection Time: 04/21/22  8:38 PM   Specimen: BLOOD  Result Value Ref Range Status   Specimen Description BLOOD BLOOD RIGHT FOREARM  Final   Special Requests   Final    BOTTLES DRAWN AEROBIC AND ANAEROBIC Blood Culture adequate volume   Culture   Final    NO GROWTH 5 DAYS Performed at [facility], [address] [place], [place] [address]    Report Status 04/26/2022 FINAL  Final  Blood Culture (routine x 2)     Status: None   Collection Time: 04/21/22  8:38 PM   Specimen: BLOOD  Result Value Ref Range Status   Specimen Description BLOOD BLOOD RIGHT ARM  Final   Special Requests   Final    BOTTLES DRAWN AEROBIC AND ANAEROBIC Blood Culture adequate volume   Culture   Final    NO GROWTH 5 DAYS Performed at [facility], [address] [place], [place] [address]    Report Status 04/26/2022 FINAL  Final  C Difficile Quick Screen w PCR reflex     Status: None   Collection Time: 04/22/22  2:30 AM   Specimen: STOOL  Result Value Ref Range Status   C Diff antigen NEGATIVE NEGATIVE Final   C Diff toxin NEGATIVE NEGATIVE Final   C Diff interpretation No C. difficile detected.  Final    Comment: Performed at [facility], [address] [place], [place] [address]  Gastrointestinal Panel by PCR , Stool     Status: None    Collection Time: 04/22/22  2:30 AM   Specimen: STOOL  Result Value Ref Range Status   Campylobacter species NOT DETECTED NOT DETECTED Final   Plesimonas shigelloides NOT DETECTED NOT DETECTED Final   Salmonella species NOT DETECTED NOT DETECTED Final   Yersinia enterocolitica NOT DETECTED NOT DETECTED Final   Vibrio species NOT DETECTED NOT DETECTED Final   Vibrio cholerae NOT DETECTED NOT DETECTED Final   Enteroaggregative E coli (EAEC) NOT DETECTED NOT DETECTED Final   Enteropathogenic E coli (EPEC) NOT DETECTED NOT DETECTED Final   Enterotoxigenic  E coli (ETEC) NOT DETECTED NOT DETECTED Final   Shiga like toxin producing E coli (STEC) NOT DETECTED NOT DETECTED Final   Shigella/Enteroinvasive E coli (EIEC) NOT DETECTED NOT DETECTED Final   Cryptosporidium NOT DETECTED NOT DETECTED Final   Cyclospora cayetanensis NOT DETECTED NOT DETECTED Final   Entamoeba histolytica NOT DETECTED NOT DETECTED Final   Giardia lamblia NOT DETECTED NOT DETECTED Final   Adenovirus F40/41 NOT DETECTED NOT DETECTED Final   Astrovirus NOT DETECTED NOT DETECTED Final   Norovirus GI/GII NOT DETECTED NOT DETECTED Final   Rotavirus A NOT DETECTED NOT DETECTED Final   Sapovirus (I, II, IV, and V) NOT DETECTED NOT DETECTED Final    Comment: Performed at [facility], [address], [place], [place] [address]  Urine Culture     Status: Abnormal   Collection Time: 04/25/22  2:06 PM   Specimen: Urine, Random  Result Value Ref Range Status   Specimen Description   Final    URINE, RANDOM Performed at [facility], [address], [place], [place] [address]    Special Requests   Final    NONE Reflexed from [id number] Performed at [facility], [address] [place], [place] [address]    Culture (A)  Final    <10,000 COLONIES/mL INSIGNIFICANT GROWTH Performed at [facility], [address] [place], [place] [address]    Report Status 04/26/2022 FINAL  Final     Labs: CBC: Recent Labs  Lab 04/24/22 0405 04/25/22 0357 04/26/22 0145 04/27/22 0635 04/28/22 0903  WBC 5.8 5.9 5.8 5.1 4.9  HGB 9.2* 8.0* 8.2* 8.0* 8.3*  HCT 29.3* 26.6* 26.9* 25.9* 28.0*  MCV 85.2 88.7 89.1 88.1 88.9  PLT 189 165 165 184 232   Basic Metabolic Panel: Recent Labs  Lab 04/24/22 0405 04/24/22 1253 04/25/22 0357 04/26/22 0145 04/27/22 0635 04/28/22 0903  NA 139  --  138 138 138 141  K 3.4*  --  3.3* 3.7 3.3* 3.0*  CL 104  --  104 106 106 109  CO2 26  --  24 19* 28 27  GLUCOSE 81  --  158* 238* 158* 123*  BUN 12  --  11 12 8 8   CREATININE <0.30*  --  <0.30* 0.47 0.32* 0.30*  CALCIUM 9.1  --  8.4* 8.5* 8.6* 8.5*  MG 1.9  --  2.1 1.8 1.6* 1.8  PHOS  --  2.6 3.0  --   --   --    Liver Function Tests: No results for input(s): "AST", "ALT", "ALKPHOS", "BILITOT", "PROT", "ALBUMIN" in the last 168 hours.  CBG: Recent Labs  Lab 04/27/22 0759 04/27/22 1132 04/27/22 1723 04/27/22 2107 04/28/22 0755  GLUCAP 143* 177* 269* 193* 149*    Discharge time spent: greater than 30 minutes.  Signed: Pennie Banter, DO [facility] 04/30/2022

## 2022-04-28 NOTE — TOC Transition Note (Signed)
Transition of Care [facility]) - Progression Note    Patient Details  Name: Beth Carroll MRN: [id number] Date of Birth: 1971-01-23  Transition of [facility] CM/SW Contact  Beverly Sessions, RN Phone Number: 04/28/2022, 11:12 AM  Clinical Narrative:    Patient to discharge today Son to transport Drop arm bedside commode and slide board delivered by [employer] yesterday Notified [place] with [place] [facility]  Unit secretary to make follow up appointment with wound care center prior to discharge         Expected Discharge Plan and Services         Expected Discharge Date: 04/28/22                                     Social Determinants of Health (SDOH) Interventions SDOH Screenings   Food Insecurity: No Food Insecurity (04/22/2022)  Housing: Low Risk  (04/22/2022)  Transportation Needs: No Transportation Needs (04/22/2022)  Utilities: Not At Risk (04/22/2022)  Depression (PHQ2-9): Low Risk  (04/13/2021)  Tobacco Use: Medium Risk (04/27/2022)    Readmission Risk Interventions     No data to display

## 2022-04-30 ENCOUNTER — Encounter: Payer: Self-pay | Admitting: Hospitalist

## 2022-04-30 DIAGNOSIS — E8729 Other acidosis: Secondary | ICD-10-CM

## 2022-04-30 DIAGNOSIS — A419 Sepsis, unspecified organism: Secondary | ICD-10-CM | POA: Diagnosis present

## 2022-04-30 DIAGNOSIS — J449 Chronic obstructive pulmonary disease, unspecified: Secondary | ICD-10-CM | POA: Diagnosis present

## 2022-04-30 DIAGNOSIS — N179 Acute kidney failure, unspecified: Secondary | ICD-10-CM | POA: Diagnosis present

## 2022-04-30 DIAGNOSIS — T148XXA Other injury of unspecified body region, initial encounter: Secondary | ICD-10-CM | POA: Diagnosis present

## 2022-04-30 DIAGNOSIS — R112 Nausea with vomiting, unspecified: Secondary | ICD-10-CM

## 2022-04-30 DIAGNOSIS — R197 Diarrhea, unspecified: Secondary | ICD-10-CM | POA: Diagnosis present

## 2022-04-30 HISTORY — DX: Other disorders of phosphorus metabolism: E83.39

## 2022-04-30 HISTORY — DX: Sepsis, unspecified organism: A41.9

## 2022-04-30 HISTORY — DX: Nausea with vomiting, unspecified: R11.2

## 2022-04-30 HISTORY — DX: Other acidosis: E87.29

## 2022-04-30 HISTORY — DX: Acute kidney failure, unspecified: N17.9

## 2022-04-30 NOTE — Assessment & Plan Note (Signed)
Sacral wound - chronic stable Bilateral heels wounds

## 2022-05-06 ENCOUNTER — Encounter: Payer: Medicaid Other | Attending: Physician Assistant | Admitting: Physician Assistant

## 2022-05-06 DIAGNOSIS — L89152 Pressure ulcer of sacral region, stage 2: Secondary | ICD-10-CM | POA: Diagnosis not present

## 2022-05-06 DIAGNOSIS — I5042 Chronic combined systolic (congestive) and diastolic (congestive) heart failure: Secondary | ICD-10-CM | POA: Diagnosis not present

## 2022-05-06 DIAGNOSIS — I110 Hypertensive heart disease with heart failure: Secondary | ICD-10-CM | POA: Diagnosis not present

## 2022-05-06 DIAGNOSIS — L97522 Non-pressure chronic ulcer of other part of left foot with fat layer exposed: Secondary | ICD-10-CM | POA: Insufficient documentation

## 2022-05-06 DIAGNOSIS — I7389 Other specified peripheral vascular diseases: Secondary | ICD-10-CM | POA: Diagnosis not present

## 2022-05-06 DIAGNOSIS — I509 Heart failure, unspecified: Secondary | ICD-10-CM | POA: Diagnosis not present

## 2022-05-06 DIAGNOSIS — L97512 Non-pressure chronic ulcer of other part of right foot with fat layer exposed: Secondary | ICD-10-CM | POA: Insufficient documentation

## 2022-05-06 DIAGNOSIS — L89616 Pressure-induced deep tissue damage of right heel: Secondary | ICD-10-CM | POA: Diagnosis not present

## 2022-05-06 DIAGNOSIS — E11621 Type 2 diabetes mellitus with foot ulcer: Secondary | ICD-10-CM | POA: Diagnosis present

## 2022-05-06 DIAGNOSIS — L89626 Pressure-induced deep tissue damage of left heel: Secondary | ICD-10-CM | POA: Diagnosis not present

## 2022-05-07 NOTE — Progress Notes (Addendum)
[place], Beth Carroll ([id number]) [id number] Page 1 of 10 Visit Report for 05/06/2022 Chief Complaint Document Details Patient Name: Date of Service: Beth Carroll [facility] 05/06/2022 8:15 A M Medical Record Number: [id number] Patient Account Number: [id number] Date of Birth/Sex: Treating RN: [birth year] (52 y.o. [name] Primary Care Provider: Marcelino Duster Other Clinician: Referring Provider: Treating Provider/Extender: Joylene Grapes in Treatment: 0 Information Obtained from: Patient Chief Complaint Bilateral LE Ulcers Electronic Signature(s) Signed: 05/06/2022 9:01:25 AM By: Allen Derry PA-C Entered By: Allen Derry on 05/06/2022 09:01:24 -------------------------------------------------------------------------------- HPI Details Patient Name: Date of Service: Beth Carroll 05/06/2022 8:15 A M Medical Record Number: [id number] Patient Account Number: [id number] Date of Birth/Sex: Treating RN: 1970/07/20 (52 y.o. [name] Primary Care Provider: Marcelino Duster Other Clinician: Referring Provider: Treating Provider/Extender: Joylene Grapes in Treatment: 0 History of Present Illness HPI Description: 05-07-2022 upon evaluation today patient appears to be doing somewhat poorly in regard to her right foot in general. She subsequently also has a wound on the sacral area but this is a very superficial stage II pressure ulcer. Nonetheless the foot in particular although I could not get in epic I gleaned is much as I could from her story of what exactly happened here. T backtrack it does appear that she was in the hospital for diabetic ketoacidosis and this o was back in March 2024. With that being said during the time that she was in the hospital she also apparently had an issue with having a thrombus in the right leg in the ankle/foot region which ended up with her having essentially complete loss of blood flow to her foot  where it was turning purple and discoloring rapidly. She ended up having a nurse evaluate this and ended up with a stat angiography to go in and find what was going on they were able to identify the clot clear this out and subsequently restore blood flow. However this was not without already having damage to the distal portion of her toes. With that being said the good news is she seems to have good blood flow at the moment I was able to recheck and that we were able to get a good pulse with Doppler. With that being said I think that a lot of the injury to her toes is more of a degloving type injury at this point which hopefully will heal the first and second toes are the main ones that are of concern in my opinion the others I think just have blisters which will come off easily and should heal quite readily. The patient tells me that her blood sugars are still elevated but not as bad as what they have been previous. Again she has had multiple instances of having fairly significant DKA based on what I am hearing. Patient does have a history of peripheral vascular disease of the right lower extremity, stage II pressure ulcer to the sacrum that occurred while she was in the hospital, diabetes mellitus type 2, congestive heart failure, and hypertension. Her most recent hemoglobin A1c was 9.8 that was on 04-23-2022 Electronic Signature(s) Signed: 05/07/2022 7:18:16 AM By: Allen Derry PA-C Entered By: Allen Derry on 05/07/2022 07:18:16 -------------------------------------------------------------------------------- Physical Exam Details Patient Name: Date of Service: Beth Carroll 05/06/2022 8:15 A M Medical Record Number: [id number] Patient Account Number: [id number] Date of Birth/Sex: Treating RN: 1971/01/01 (52 y.o. [name] Primary Care Provider: Marcelino Duster Other Clinician: Referring Provider: Treating Provider/Extender: Youlanda Roys, Vivianne Spence  in Treatment:  0 Constitutional sitting or standing blood pressure is within target range for patient.. pulse regular and within target range for patient.Marland Kitchen respirations regular, non-labored and within target range for patient.Marland Kitchen temperature within target range for patient.. Well-nourished and well-hydrated in no acute distress. [place], Beth Carroll ([id number]) [id number] Page 2 of 10 Eyes conjunctiva clear no eyelid edema noted. pupils equal round and reactive to light and accommodation. Ears, Nose, Mouth, and Throat no gross abnormality of ear auricles or external auditory canals. normal hearing noted during conversation. mucus membranes moist. Respiratory normal breathing without difficulty. Cardiovascular 1+ dorsalis pedis/posterior tibialis pulses. no clubbing, cyanosis, significant edema, <3 sec cap refill. Musculoskeletal normal gait and posture. no significant deformity or arthritic changes, no loss or range of motion, no clubbing. Psychiatric this patient is able to make decisions and demonstrates good insight into disease process. Alert and Oriented x 3. pleasant and cooperative. Notes Upon inspection patient's wound bed actually showed signs of again having blisters at each of the toes on the right foot. Fortunately there does not appear to be any signs of active infection at this time. With that being said she does have what still appears to be some questionable ability to heal especially the first and second toes I do not think she is completely out of the woods yet regular see how things proceed. She does seem to have a pulse in the dorsalis pedis and posterior tibial especially on Doppler we are getting a very good signal. I do not think that she has any compromised blood flow right now but this is still very touching go situation in my opinion. Electronic Signature(s) Signed: 05/07/2022 7:19:10 AM By: Allen Derry PA-C Entered By: Allen Derry on 05/07/2022  07:19:10 -------------------------------------------------------------------------------- Physician Orders Details Patient Name: Date of Service: Beth Carroll [facility] 05/06/2022 8:15 A M Medical Record Number: [id number] Patient Account Number: [id number] Date of Birth/Sex: Treating RN: 10/06/1970 (52 y.o. [name] Primary Care Provider: Marcelino Duster Other Clinician: Referring Provider: Treating Provider/Extender: Joylene Grapes in Treatment: 0 Verbal / Phone Orders: No Diagnosis Coding ICD-10 Coding Code Description I73.89 Other specified peripheral vascular diseases L97.512 Non-pressure chronic ulcer of other part of right foot with fat layer exposed L97.522 Non-pressure chronic ulcer of other part of left foot with fat layer exposed L89.152 Pressure ulcer of sacral region, stage 2 L89.616 Pressure-induced deep tissue damage of right heel L89.626 Pressure-induced deep tissue damage of left heel 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 Home Health Home Health Company: - [facility] CONTINUE Home Health for wound care. May utilize formulary equivalent dressing for wound treatment orders unless otherwise specified. Home Health Nurse may visit PRN to address patients wound care needs. Scheduled days for dressing changes to be completed; exception, patient has scheduled wound care visit that day. **Please direct any NON-WOUND related issues/requests for orders to patient's Primary Care Physician. **If current dressing causes regression in wound condition, may D/C ordered dressing product/s and apply Normal Saline Moist Dressing daily until next Wound Healing Center or Other MD appointment. **Notify Wound Healing Center of regression in wound condition at [phone]. Bathing/  [employer] wounds with antibacterial soap and water. - dial soap recommended May shower; gently cleanse wound with antibacterial soap, rinse and pat dry prior to dressing wounds No tub bath. Anesthetic (Use 'Patient Medications' Section for Anesthetic Order Entry)  [place], Beth Carroll ([id number]) [id number] Page 3 of 10 Lidocaine applied to wound bed Edema Control - Lymphedema / Segmental Compressive Device / Other Elevate, Exercise Daily and A void Standing for Long Periods of Time. Elevate legs to the level of the heart and pump ankles as often as possible Elevate leg(s) parallel to the floor when sitting. DO YOUR BEST to sleep in the bed at night. DO NOT sleep in your recliner. Long hours of sitting in a recliner leads to swelling of the legs and/or potential wounds on your backside. Off-Loading Turn and reposition every 2 hours - keep pressure off sacrum Wound Treatment Wound #1 - T Second oe Wound Laterality: Right Cleanser: Soap and Water 3 x Per Week/30 Days Discharge Instructions: Gently cleanse wound with antibacterial soap, rinse and pat dry prior to dressing wounds Prim Dressing: Silvercel 4 1/4x 4 1/4 (in/in) 3 x Per Week/30 Days ary Discharge Instructions: Apply Silvercel 4 1/4x 4 1/4 (in/in) as instructed Secondary Dressing: ABD Pad 5x9 (in/in) 3 x Per Week/30 Days Discharge Instructions: Cover with ABD pad Secured With: Medipore T - 16M Medipore H Soft Cloth Surgical T ape ape, 2x2 (in/yd) 3 x Per Week/30 Days Secured With: [employer] or Non-Sterile 6-ply 4.5x4 (yd/yd) 3 x Per Week/30 Days Discharge Instructions: Apply Kerlix as directed Wound #2 - T Great oe Wound Laterality: Right Cleanser: Soap and Water 3 x Per Week/30 Days Discharge Instructions: Gently cleanse wound with antibacterial soap, rinse and pat dry prior to dressing wounds Prim Dressing: Silvercel 4 1/4x 4 1/4 (in/in) 3 x Per Week/30 Days ary Discharge  Instructions: Apply Silvercel 4 1/4x 4 1/4 (in/in) as instructed Secondary Dressing: ABD Pad 5x9 (in/in) 3 x Per Week/30 Days Discharge Instructions: Cover with ABD pad Secured With: Medipore T - 16M Medipore H Soft Cloth Surgical T ape ape, 2x2 (in/yd) 3 x Per Week/30 Days Secured With: [employer] or Non-Sterile 6-ply 4.5x4 (yd/yd) 3 x Per Week/30 Days Discharge Instructions: Apply Kerlix as directed Wound #3 - Sacrum Wound Laterality: Midline Cleanser: Soap and Water 3 x Per Week/30 Days Discharge Instructions: Gently cleanse wound with antibacterial soap, rinse and pat dry prior to dressing wounds Topical: Calmoseptine 3 x Per Week/30 Days Discharge Instructions: to sacral wound Secondary Dressing: ABD Pad 5x9 (in/in) 3 x Per Week/30 Days Discharge Instructions: Cover with ABD pad Wound #5 - Calcaneus Wound Laterality: Right Cleanser: Soap and Water 3 x Per Week/30 Days Discharge Instructions: Gently cleanse wound with antibacterial soap, rinse and pat dry prior to dressing wounds Prim Dressing: Silvercel 4 1/4x 4 1/4 (in/in) 3 x Per Week/30 Days ary Discharge Instructions: Apply Silvercel 4 1/4x 4 1/4 (in/in) as instructed Secondary Dressing: ABD Pad 5x9 (in/in) 3 x Per Week/30 Days Discharge Instructions: Cover with ABD pad Secured With: Medipore T - 16M Medipore H Soft Cloth Surgical T ape ape, 2x2 (in/yd) 3 x Per Week/30 Days Secured With: [employer] or Non-Sterile 6-ply 4.5x4 (yd/yd) 3 x Per Week/30 Days Discharge Instructions: Apply Kerlix as directed Wound #6 - T Third oe Wound Laterality: Right Cleanser: Soap and Water 3 x Per Week/30 Days Discharge Instructions: Gently cleanse wound with antibacterial soap, rinse and pat dry prior to dressing wounds Prim Dressing: Silvercel 4 1/4x 4 1/4 (in/in) 3 x Per Week/30 Days ary Discharge Instructions: Apply Silvercel 4 1/4x 4 1/4 (in/in) as instructed Secondary Dressing: ABD Pad 5x9 (in/in) 3 x Per Week/30  Days [place], Beth Carroll ([id number]) [id number] Page 4 of 10  Discharge Instructions: Cover with ABD pad Secured With: Medipore T - 5M Medipore H Soft Cloth Surgical T ape ape, 2x2 (in/yd) 3 x Per Week/30 Days Secured With: [employer] Sterile or Non-Sterile 6-ply 4.5x4 (yd/yd) 3 x Per Week/30 Days Discharge Instructions: Apply Kerlix as directed Wound #7 - T Fifth oe Wound Laterality: Right Cleanser: Soap and Water 3 x Per Week/30 Days Discharge Instructions: Gently cleanse wound with antibacterial soap, rinse and pat dry prior to dressing wounds Prim Dressing: Silvercel 4 1/4x 4 1/4 (in/in) 3 x Per Week/30 Days ary Discharge Instructions: Apply Silvercel 4 1/4x 4 1/4 (in/in) as instructed Secondary Dressing: ABD Pad 5x9 (in/in) 3 x Per Week/30 Days Discharge Instructions: Cover with ABD pad Secured With: Medipore T - 5M Medipore H Soft Cloth Surgical T ape ape, 2x2 (in/yd) 3 x Per Week/30 Days Secured With: [employer] or Non-Sterile 6-ply 4.5x4 (yd/yd) 3 x Per Week/30 Days Discharge Instructions: Apply Kerlix as directed Electronic Signature(s) Signed: 05/07/2022 12:51:53 PM By: Angelina Pih Signed: 05/07/2022 1:04:02 PM By: Allen Derry PA-C Previous Signature: 05/06/2022 5:07:08 PM Version By: Angelina Pih Previous Signature: 05/06/2022 10:26:23 PM Version By: Allen Derry PA-C Entered By: Angelina Pih on 05/07/2022 08:41:41 -------------------------------------------------------------------------------- Problem List Details Patient Name: Date of Service: TELSA, DILLAVOU [facility] 05/06/2022 8:15 A M Medical Record Number: [id number] Patient Account Number: [id number] Date of Birth/Sex: Treating RN: [birth year] (52 y.o. [name] Primary Care Provider: Marcelino Duster Other Clinician: Referring Provider: Treating Provider/Extender: Joylene Grapes in Treatment: 0 Active Problems ICD-10 Encounter Code Description Active Date  MDM Diagnosis I73.89 Other specified peripheral vascular diseases 05/06/2022 No Yes L97.512 Non-pressure chronic ulcer of other part of right foot with fat layer exposed 05/06/2022 No Yes L97.522 Non-pressure chronic ulcer of other part of left foot with fat layer exposed 05/06/2022 No Yes L89.152 Pressure ulcer of sacral region, stage 2 05/06/2022 No Yes L89.616 Pressure-induced deep tissue damage of right heel 05/06/2022 No Yes L89.626 Pressure-induced deep tissue damage of left heel 05/06/2022 No Yes E11.621 Type 2 diabetes mellitus with foot ulcer 05/06/2022 No Yes [place], Therese ([id number]) [id number] Page 5 of 10 I50.42 Chronic combined systolic (congestive) and diastolic (congestive) heart failure 05/06/2022 No Yes I10 Essential (primary) hypertension 05/06/2022 No Yes Inactive Problems Resolved Problems Electronic Signature(s) Signed: 05/06/2022 9:00:48 AM By: Allen Derry PA-C Entered By: Allen Derry on 05/06/2022 09:00:47 -------------------------------------------------------------------------------- Progress Note Details Patient Name: Date of Service: RABECKA, BRENDEL 05/06/2022 8:15 A M Medical Record Number: [id number] Patient Account Number: [id number] Date of Birth/Sex: Treating RN: [birth year] (52 y.o. [name] Primary Care Provider: Marcelino Duster Other Clinician: Referring Provider: Treating Provider/Extender: Joylene Grapes in Treatment: 0 Subjective Chief Complaint Information obtained from Patient Bilateral LE Ulcers History of Present Illness (HPI) 05-07-2022 upon evaluation today patient appears to be doing somewhat poorly in regard to her right foot in general. She subsequently also has a wound on the sacral area but this is a very superficial stage II pressure ulcer. Nonetheless the foot in particular although I could not get in epic I gleaned is much as I could from her story of what exactly happened here. T  backtrack it does appear that she was in the hospital for diabetic ketoacidosis and this was back in March o 2024. With that being said during the time that she was in the hospital she also apparently had an issue with having a thrombus in the right leg in the ankle/foot region which  ended up with her having essentially complete loss of blood flow to her foot where it was turning purple and discoloring rapidly. She ended up having a nurse evaluate this and ended up with a stat angiography to go in and find what was going on they were able to identify the clot clear this out and subsequently restore blood flow. However this was not without already having damage to the distal portion of her toes. With that being said the good news is she seems to have good blood flow at the moment I was able to recheck and that we were able to get a good pulse with Doppler. With that being said I think that a lot of the injury to her toes is more of a degloving type injury at this point which hopefully will heal the first and second toes are the main ones that are of concern in my opinion the others I think just have blisters which will come off easily and should heal quite readily. The patient tells me that her blood sugars are still elevated but not as bad as what they have been previous. Again she has had multiple instances of having fairly significant DKA based on what I am hearing. Patient does have a history of peripheral vascular disease of the right lower extremity, stage II pressure ulcer to the sacrum that occurred while she was in the hospital, diabetes mellitus type 2, congestive heart failure, and hypertension. Her most recent hemoglobin A1c was 9.8 that was on 04-23-2022 Patient History Information obtained from Patient. Allergies No Known Drug Allergies Social History Former smoker - quit 2022, Alcohol Use - Never, Drug Use - No History, Caffeine Use - Rarely - diet dr pepper. Medical  History Cardiovascular Patient has history of Congestive Heart Failure, Hypertension, Peripheral Arterial Disease Endocrine Patient has history of Type II Diabetes Patient is treated with Insulin. Blood sugar is tested. Blood sugar results noted at the following times: Breakfast - 150. Review of Systems (ROS) Constitutional Symptoms (General Health) Denies complaints or symptoms of Fatigue, Fever, Chills, Marked Weight Change. Eyes Complains or has symptoms of Glasses / Contacts. Ear/Nose/Mouth/Throat Denies complaints or symptoms of Difficult clearing ears, Sinusitis. Hematologic/Lymphatic Complains or has symptoms of Bleeding / Clotting Disorders, DVT right leg recently, stents in right and left leg Respiratory [place], Beth Carroll ([id number]) [id number] Page 6 of 10 Denies complaints or symptoms of Chronic or frequent coughs, Shortness of Breath. Gastrointestinal Denies complaints or symptoms of Frequent diarrhea, Nausea, Vomiting. Genitourinary Denies complaints or symptoms of Kidney failure/ Dialysis, Incontinence/dribbling. Immunological Denies complaints or symptoms of Hives, Itching. Integumentary (Skin) Complains or has symptoms of Wounds, Swelling - bilat LE. Musculoskeletal Denies complaints or symptoms of Muscle Pain, Muscle Weakness. Neurologic Denies complaints or symptoms of Numbness/parasthesias, Focal/Weakness. Psychiatric Denies complaints or symptoms of Anxiety, Claustrophobia. Objective Constitutional sitting or standing blood pressure is within target range for patient.. pulse regular and within target range for patient.Marland Kitchen respirations regular, non-labored and within target range for patient.Marland Kitchen temperature within target range for patient.. Well-nourished and well-hydrated in no acute distress. Vitals Time Taken: 8:03 AM, Height: 66 in, Source: Stated, Weight: 118 lbs, Source: Stated, BMI: 19, Temperature: 98 F, Pulse: 80 bpm, Respiratory  Rate: 18 breaths/min, Blood Pressure: 93/64 mmHg. General Notes: pt asymptomatic with lower BP Eyes conjunctiva clear no eyelid edema noted. pupils equal round and reactive to light and accommodation. Ears, Nose, Mouth, and Throat no gross abnormality of ear auricles or external auditory canals. normal hearing noted during  conversation. mucus membranes moist. Respiratory normal breathing without difficulty. Cardiovascular 1+ dorsalis pedis/posterior tibialis pulses. no clubbing, cyanosis, significant edema, Musculoskeletal normal gait and posture. no significant deformity or arthritic changes, no loss or range of motion, no clubbing. Psychiatric this patient is able to make decisions and demonstrates good insight into disease process. Alert and Oriented x 3. pleasant and cooperative. General Notes: Upon inspection patient's wound bed actually showed signs of again having blisters at each of the toes on the right foot. Fortunately there does not appear to be any signs of active infection at this time. With that being said she does have what still appears to be some questionable ability to heal especially the first and second toes I do not think she is completely out of the woods yet regular see how things proceed. She does seem to have a pulse in the dorsalis pedis and posterior tibial especially on Doppler we are getting a very good signal. I do not think that she has any compromised blood flow right now but this is still very touching go situation in my opinion. Integumentary (Hair, Skin) Wound #1 status is Open. Original cause of wound was Gradually Appeared. The date acquired was: 04/21/2022. The wound is located on the Right T Second. oe The wound measures 2cm length x 1.7cm width x 0.1cm depth; 2.67cm^2 area and 0.267cm^3 volume. There is a medium amount of serosanguineous drainage noted. There is no granulation within the wound bed. There is a large (67-100%) amount of necrotic tissue  within the wound bed including Eschar and Adherent Slough. Wound #2 status is Open. Original cause of wound was Gradually Appeared. The date acquired was: 04/21/2022. The wound is located on the Right T Great. oe The wound measures 0.1cm length x 0.1cm width x 0.1cm depth; 0.008cm^2 area and 0.001cm^3 volume. There is no tunneling or undermining noted. There is a none present amount of drainage noted. There is no granulation within the wound bed. General Notes: toe is discolored Wound #3 status is Open. Original cause of wound was Gradually Appeared. The date acquired was: 04/21/2022. The wound is located on the Midline Sacrum. The wound measures 1cm length x 0.3cm width x 0.1cm depth; 0.236cm^2 area and 0.024cm^3 volume. There is no tunneling or undermining noted. There is a none present amount of drainage noted. There is large (67-100%) red, pink granulation within the wound bed. There is a small (1-33%) amount of necrotic tissue within the wound bed including Adherent Slough. Wound #5 status is Open. Original cause of wound was Gradually Appeared. The date acquired was: 04/21/2022. The wound is located on the Right Calcaneus. The wound measures 6cm length x 5cm width x 0.1cm depth; 23.562cm^2 area and 2.356cm^3 volume. There is Fat Layer (Subcutaneous Tissue) exposed. There is no tunneling or undermining noted. There is a medium amount of serosanguineous drainage noted. There is medium (34-66%) red, pink granulation within the wound bed. There is a medium (34-66%) amount of necrotic tissue within the wound bed including Adherent Slough. Wound #6 status is Open. Original cause of wound was Gradually Appeared. The date acquired was: 04/21/2022. The wound is located on the Right T Third. oe The wound measures 5cm length x 0.7cm width x 0.1cm depth; 2.749cm^2 area and 0.275cm^3 volume. There is Fat Layer (Subcutaneous Tissue) exposed. There is a medium amount of serosanguineous drainage noted. There is  large (67-100%) red, pink granulation within the wound bed. There is a small (1-33%) amount of necrotic tissue within the wound  bed including Adherent Slough. Wound #7 status is Open. Original cause of wound was Gradually Appeared. The date acquired was: 04/21/2022. The wound is located on the Right T Fifth. oe The wound measures 0.5cm length x 0.5cm width x 0.1cm depth; 0.196cm^2 area and 0.02cm^3 volume. There is Fat Layer (Subcutaneous Tissue) exposed. There is no tunneling or undermining noted. There is a medium amount of serosanguineous drainage noted. There is large (67-100%) red, pink granulation within the wound bed. There is a small (1-33%) amount of necrotic tissue within the wound bed including Adherent Slough. 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 Bathing/ Shower/ Hygiene: Wash wounds with antibacterial soap and water. - dial soap recommended May shower; gently cleanse wound with antibacterial soap, rinse and pat dry prior to dressing wounds No tub bath. Anesthetic (Use 'Patient Medications' Section for Anesthetic Order Entry): Lidocaine applied to wound bed Edema Control - Lymphedema / Segmental Compressive Device / Other: Elevate, Exercise Daily and Avoid Standing for Long Periods of Time. Elevate legs to the level of the heart and pump ankles as often as  possible Elevate leg(s) parallel to the floor when sitting. DO YOUR BEST to sleep in the bed at night. DO NOT sleep in your recliner. Long hours of sitting in a recliner leads to swelling of the legs and/or potential wounds on your backside. Off-Loading: Turn and reposition every 2 hours - keep pressure off sacrum WOUND #1: - T Second Wound Laterality: Right oe Cleanser: Soap and Water 3 x Per Week/30 Days Discharge Instructions: Gently cleanse wound with antibacterial soap, rinse and pat dry prior to dressing wounds Prim Dressing: Silvercel 4 1/4x 4 1/4 (in/in) 3 x Per Week/30 Days ary Discharge Instructions: Apply Silvercel 4 1/4x 4 1/4 (in/in) as instructed Secondary Dressing: ABD Pad 5x9 (in/in) 3 x Per Week/30 Days Discharge Instructions: Cover with ABD pad Secured With: Medipore T - 64M Medipore H Soft Cloth Surgical T ape ape, 2x2 (in/yd) 3 x Per Week/30 Days Secured With: [employer] or Non-Sterile 6-ply 4.5x4 (yd/yd) 3 x Per Week/30 Days Discharge Instructions: Apply Kerlix as directed WOUND #2: - T Great Wound Laterality: Right oe Cleanser: Soap and Water 3 x Per Week/30 Days Discharge Instructions: Gently cleanse wound with antibacterial soap, rinse and pat dry prior to dressing wounds Prim Dressing: Silvercel 4 1/4x 4 1/4 (in/in) 3 x Per Week/30 Days ary Discharge Instructions: Apply Silvercel 4 1/4x 4 1/4 (in/in) as instructed Secondary Dressing: ABD Pad 5x9 (in/in) 3 x Per Week/30 Days Discharge Instructions: Cover with ABD pad Secured With: Medipore T - 64M Medipore H Soft Cloth Surgical T ape ape, 2x2 (in/yd) 3 x Per Week/30 Days Secured With: [employer] or Non-Sterile 6-ply 4.5x4 (yd/yd) 3 x Per Week/30 Days Discharge Instructions: Apply Kerlix as directed WOUND #3: - Sacrum Wound Laterality: Midline Cleanser: Soap and Water 3 x Per Week/30 Days Discharge Instructions: Gently cleanse wound with antibacterial soap, rinse and pat dry prior to dressing  wounds Topical: Calmoseptine 3 x  Per Week/30 Days Discharge Instructions: to sacral wound Secondary Dressing: ABD Pad 5x9 (in/in) 3 x Per Week/30 Days Discharge Instructions: Cover with ABD pad WOUND #5: - Calcaneus Wound Laterality: Right Cleanser: Soap and Water 3 x Per Week/30 Days Discharge Instructions: Gently cleanse wound with antibacterial soap, rinse and pat dry prior to dressing wounds Prim Dressing: Silvercel 4 1/4x 4 1/4 (in/in) 3 x Per Week/30 Days ary Discharge Instructions: Apply Silvercel 4 1/4x 4 1/4 (in/in) as instructed Secondary Dressing: ABD Pad 5x9 (in/in) 3 x Per Week/30 Days Discharge Instructions: Cover with ABD pad Secured With: Medipore T - 86M Medipore H Soft Cloth Surgical T ape ape, 2x2 (in/yd) 3 x Per Week/30 Days Secured With: [employer] or Non-Sterile 6-ply 4.5x4 (yd/yd) 3 x Per Week/30 Days Discharge Instructions: Apply Kerlix as directed WOUND #6: - T Third Wound Laterality: Right oe Cleanser: Soap and Water 3 x Per Week/30 Days Discharge Instructions: Gently cleanse wound with antibacterial soap, rinse and pat dry prior to dressing wounds Prim Dressing: Silvercel 4 1/4x 4 1/4 (in/in) 3 x Per Week/30 Days ary Discharge Instructions: Apply Silvercel 4 1/4x 4 1/4 (in/in) as instructed Secondary Dressing: ABD Pad 5x9 (in/in) 3 x Per Week/30 Days Discharge Instructions: Cover with ABD pad Secured With: Medipore T - 86M Medipore H Soft Cloth Surgical T ape ape, 2x2 (in/yd) 3 x Per Week/30 Days Secured With: [employer] or Non-Sterile 6-ply 4.5x4 (yd/yd) 3 x Per Week/30 Days Discharge Instructions: Apply Kerlix as directed WOUND #7: - T Fifth Wound Laterality: Right oe Cleanser: Soap and Water 3 x Per Week/30 Days Discharge Instructions: Gently cleanse wound with antibacterial soap, rinse and pat dry prior to dressing wounds [place], Beth Carroll ([id number]) [id number] Page 8 of 10 Prim Dressing: Silvercel 4 1/4x 4  1/4 (in/in) 3 x Per Week/30 Days ary Discharge Instructions: Apply Silvercel 4 1/4x 4 1/4 (in/in) as instructed Secondary Dressing: ABD Pad 5x9 (in/in) 3 x Per Week/30 Days Discharge Instructions: Cover with ABD pad Secured With: Medipore T - 86M Medipore H Soft Cloth Surgical T ape ape, 2x2 (in/yd) 3 x Per Week/30 Days Secured With: [employer] Sterile or Non-Sterile 6-ply 4.5x4 (yd/yd) 3 x Per Week/30 Days Discharge Instructions: Apply Kerlix as directed 1. I am good recommend based on what I am seeing that we actually use an alginate dressing over and around the toes in order to help protect and keep them from being too wet and macerated. I discussed with the patient that this is going to be important and see how things progress over the next couple of weeks that skin to determine what happens with this first and second toe in particular. She voiced understanding. 2. Also can recommend that we should cover this with an ABD pad and then lightly secure with gauze. 3. She does have a follow-up with vascular that is supposed to be scheduled sometime shortly obviously I would recommend that she keep any and every of those follow-ups. We will see patient back for reevaluation in 1 week here in the clinic. If anything worsens or changes patient will contact our office for additional recommendations. Electronic Signature(s) Signed: 05/07/2022 7:19:58 AM By: Allen Derry PA-C Entered By: Allen Derry on 05/07/2022 07:19:58 -------------------------------------------------------------------------------- ROS/PFSH Details Patient Name: Date of Service: KENDIS, DASHER [facility] 05/06/2022 8:15 A M Medical Record Number: [id number] Patient Account Number: [id number] Date of Birth/Sex: Treating RN: 10/23/1970 (52 y.o. [name] Primary Care Provider: Marcelino Duster Other Clinician: Referring Provider: Treating Provider/Extender:  Stone, Doretha Imus, John Weeks in Treatment: 0 Information Obtained  From Patient Constitutional Symptoms (General Health) Complaints and Symptoms: Negative for: Fatigue; Fever; Chills; Marked Weight Change Eyes Complaints and Symptoms: Positive for: Glasses / Contacts Ear/Nose/Mouth/Throat Complaints and Symptoms: Negative for: Difficult clearing ears; Sinusitis Hematologic/Lymphatic Complaints and Symptoms: Positive for: Bleeding / Clotting Disorders Review of System Notes: DVT right leg recently, stents in right and left leg Respiratory Complaints and Symptoms: Negative for: Chronic or frequent coughs; Shortness of Breath Gastrointestinal Complaints and Symptoms: Negative for: Frequent diarrhea; Nausea; Vomiting Genitourinary Complaints and Symptoms: Negative for: Kidney failure/ Dialysis; Incontinence/dribbling Immunological TERESIA, MYINT ([id number]) [id number] Page 9 of 10 Complaints and Symptoms: Negative for: Hives; Itching Integumentary (Skin) Complaints and Symptoms: Positive for: Wounds; Swelling - bilat LE Musculoskeletal Complaints and Symptoms: Negative for: Muscle Pain; Muscle Weakness Neurologic Complaints and Symptoms: Negative for: Numbness/parasthesias; Focal/Weakness Psychiatric Complaints and Symptoms: Negative for: Anxiety; Claustrophobia Cardiovascular Medical History: Positive for: Congestive Heart Failure; Hypertension; Peripheral Arterial Disease Endocrine Medical History: Positive for: Type II Diabetes Time with diabetes: 20 years Treated with: Insulin Blood sugar tested every day: Yes Tested : daily, has a CGM Blood sugar testing results: Breakfast: 150 Oncologic Immunizations Pneumococcal Vaccine: Received Pneumococcal Vaccination: No Implantable Devices None Family and Social History Former smoker - quit 2022; Alcohol Use: Never; Drug Use: No History; Caffeine Use: Rarely - diet dr Reino Kent Electronic Signature(s) Signed: 05/06/2022 5:07:08 PM By: Angelina Pih Signed: 05/06/2022 10:26:23 PM By: Allen Derry PA-C Entered By: Angelina Pih on 05/06/2022 08:11:31 -------------------------------------------------------------------------------- SuperBill Details Patient Name: Date of Service: IRISHA, GRANDMAISON [facility] 05/06/2022 Medical Record Number: [id number] Patient Account Number: [id number] Date of Birth/Sex: Treating RN: 1970-04-17 (52 y.o. [name] Primary Care Provider: Marcelino Duster Other Clinician: Referring Provider: Treating Provider/Extender: Joylene Grapes in Treatment: 0 Diagnosis Coding ICD-10 Codes Code Description I73.89 Other specified peripheral vascular diseases YERALDY, SPIKE ([id number]) [id number] Page 10 of 10 [phone] Non-pressure chronic ulcer of other part of right foot with fat layer exposed L97.522 Non-pressure chronic ulcer of other part of left foot with fat layer exposed L89.152 Pressure ulcer of sacral region, stage 2 L89.616 Pressure-induced deep tissue damage of right heel L89.626 Pressure-induced deep tissue damage of left heel E11.621 Type 2 diabetes mellitus with foot ulcer I50.42 Chronic combined systolic (congestive) and diastolic (congestive) heart failure I10 Essential (primary) hypertension Facility Procedures : CPT4 Code: 41324401 Description: 02725 - WOUND CARE VISIT-LEV 5 EST PT Modifier: Quantity: 1 Physician Procedures : CPT4 Code Description Modifier [id number] - WC PHYS LEVEL 4 - NEW PT ICD-10 Diagnosis Description I73.89 Other specified peripheral vascular diseases L97.512 Non-pressure chronic ulcer of other part of right foot with fat layer exposed L97.522  Non-pressure chronic ulcer of other part of left foot with fat layer exposed L89.152 Pressure ulcer of sacral region, stage 2 Quantity: 1 Electronic Signature(s) Signed: 05/07/2022 7:20:17 AM By: Allen Derry PA-C Previous Signature: 05/06/2022 4:52:51 PM Version By: Angelina Pih Previous Signature: 05/06/2022 10:26:23 PM Version By: Allen Derry PA-C Entered By: Allen Derry on 05/07/2022 07:20:16

## 2022-05-07 NOTE — Progress Notes (Signed)
[place], Beth Carroll ([id number]) [id number] Page 1 of 4 Visit Report for 05/06/2022 Abuse Risk Screen Details Patient Name: Date of Service: Beth Carroll, Beth Carroll [facility] 05/06/2022 8:15 A M Medical Record Number: [id number] Patient Account Number: [id number] Date of Birth/Sex: Treating RN: 1970/03/21 (51 y.o. Beth Carroll Primary Care Beth Carroll: Beth Carroll Other Clinician: Referring Beth Carroll: Treating Beth Carroll/Extender: Beth Carroll in Treatment: 0 Abuse Risk Screen Items Answer ABUSE RISK SCREEN: Has anyone close to you tried to hurt or harm you recentlyo No Do you feel uncomfortable with anyone in your familyo No Has anyone forced you do things that you didnt want to doo No Electronic Signature(s) Signed: 05/06/2022 5:07:08 PM By: Angelina Pih Entered By: Angelina Pih on 05/06/2022 08:09:28 -------------------------------------------------------------------------------- Activities of Daily Living Details Patient Name: Date of Service: Beth Carroll, Beth Carroll 05/06/2022 8:15 A M Medical Record Number: [id number] Patient Account Number: [id number] Date of Birth/Sex: Treating RN: 24-Dec-1970 (52 y.o. Beth Carroll Primary Care Beth Carroll: Beth Carroll Other Clinician: Referring Beth Carroll: Treating Beth Carroll/Extender: Beth Carroll in Treatment: 0 Activities of Daily Living Items Answer Activities of Daily Living (Please select one for each item) Drive Automobile Not Able T Medications ake Completely Able Use T elephone Completely Able Care for Appearance Need Assistance Use T oilet Need Assistance Bath / Shower Need Assistance Dress Self Need Assistance Feed Self Completely Able Walk Not Able Get In / Out Bed Need Assistance Housework Need Assistance Prepare Meals Need Assistance Handle Money Need Assistance Shop for Self Need Assistance Electronic Signature(s) Signed: 05/06/2022 5:07:08 PM By: Angelina Pih Entered By: Angelina Pih on 05/06/2022 08:10:06 -------------------------------------------------------------------------------- Education Screening Details Patient Name: Date of Service: Beth Carroll, Beth Carroll 05/06/2022 8:15 A M Medical Record Number: [id number] Patient Account Number: [id number] Date of Birth/Sex: Treating RN: May 22, 1970 (52 y.o. Beth Carroll Primary Care Beth Carroll: Beth Carroll Other Clinician: Referring Beth Carroll: Treating Beth Carroll/Extender: Beth Carroll in Treatment: 0 [place], [place] ([id number]) [id number] Page 2 of 4 Learning Preferences/Education Level/Primary Language Learning Preference: Explanation, Demonstration, Video, Public affairs consultant, Printed Material Preferred Language: Economist Language Barrier: No Translator Needed: No Memory Deficit: No Emotional Barrier: No Cultural/Religious Beliefs Affecting Medical Care: No Physical Barrier Impaired Vision: Yes Glasses Impaired Hearing: No Decreased Hand dexterity: No Knowledge/Comprehension Knowledge Level: High Comprehension Level: High Ability to understand written instructions: High Ability to understand verbal instructions: High Motivation Anxiety Level: Calm Cooperation: Cooperative Education Importance: Acknowledges Need Interest in Health Problems: Asks Questions Perception: Coherent Willingness to Engage in Self-Management High Activities: Readiness to Engage in Self-Management High Activities: Electronic Signature(s) Signed: 05/06/2022 5:07:08 PM By: Angelina Pih Entered By: Angelina Pih on 05/06/2022 08:10:27 -------------------------------------------------------------------------------- Fall Risk Assessment Details Patient Name: Date of Service: Beth Carroll, Beth Carroll 05/06/2022 8:15 A M Medical Record Number: [id number] Patient Account Number: [id number] Date of Birth/Sex: Treating RN: [birth year] (52  y.o. Beth Carroll Primary Care Shantrell Placzek: Beth Carroll Other Clinician: Referring Demarkis Gheen: Treating Beth Carroll/Extender: Beth Carroll in Treatment: 0 Fall Risk Assessment Items Have you had 2 or more falls in the last 12 monthso 0 No Have you had any fall that resulted in injury in the last 12 monthso 0 No FALLS RISK SCREEN History of falling - immediate or within 3 months 0 No Secondary diagnosis (Do you have 2 or more medical diagnoseso) 0 No Ambulatory aid None/bed rest/wheelchair/nurse 0 Yes Crutches/cane/walker 0 No Furniture 0 No Intravenous therapy Access/Saline/Heparin Lock 0 No Gait/Transferring Normal/ bed rest/ wheelchair 0 Yes Weak (  short steps with or without shuffle, stooped but able to lift head while walking, may seek 0 No support from furniture) Impaired (short steps with shuffle, may have difficulty arising from chair, head down, impaired 0 No balance) Mental Status Oriented to own ability [address] [place], Beth Carroll ([id number]) [id number] Page 3 of 4 Signed: 05/06/2022 5:07:08 PM By: Angelina Pih Entered By: Angelina Pih on 05/06/2022 08:10:39 -------------------------------------------------------------------------------- Foot Assessment Details Patient Name: Date of Service: Beth Carroll, Beth Carroll [facility] 05/06/2022 8:15 A M Medical Record Number: [id number] Patient Account Number: [id number] Date of Birth/Sex: Treating RN: 1971/01/03 (52 y.o. Beth Carroll Primary Care Lilja Soland: Beth Carroll Other Clinician: Referring Maeghan Canny: Treating Tonantzin Mimnaugh/Extender: Beth Carroll in Treatment: 0 Foot Assessment Items Site Locations + = Sensation present, - = Sensation absent, C = Callus, U = Ulcer R = Redness, W = Warmth, M = Maceration, PU = Pre-ulcerative lesion F = Fissure, S = Swelling, D = Dryness Assessment Right: Left: Other Deformity: No No Prior Foot Ulcer:  No No Prior Amputation: No No Charcot Joint: No No Ambulatory Status: Non-ambulatory Assistance Device: Wheelchair Gait: Surveyor, mining) Signed: 05/06/2022 5:07:08 PM By: Angelina Pih Entered By: Angelina Pih on 05/06/2022 08:27:45 -------------------------------------------------------------------------------- Nutrition Risk Screening Details Patient Name: Date of Service: Beth Carroll, Beth Carroll 05/06/2022 8:15 A M Medical Record Number: [id number] Patient Account Number: [id number] Date of Birth/Sex: Treating RN: Jan 22, 1971 (52 y.o. Beth Carroll Primary Care Kaire Stary: Beth Carroll Other Clinician: Referring Tyne Banta: Treating Raiford Fetterman/Extender: Beth Carroll in Treatment: 0 Height (in): 66 Weight (lbs): 118 Body Mass Index (BMI): 19 [place], [place] ([id number]) [phone] [id number] Page 4 of 4 Nutrition Risk Screening Items Score Screening NUTRITION RISK SCREEN: I have an illness or condition that made me change the kind and/or amount of food I eat 0 No I eat fewer than two meals per day 0 No I eat few fruits and vegetables, or milk products 0 No I have three or more drinks of beer, liquor or wine almost every day 0 No I have tooth or mouth problems that make it hard for me to eat 0 No I don't always have enough money to buy the food I need 0 No I eat alone most of the time 0 No I take three or more different prescribed or over-the-counter drugs a day 0 No Without wanting to, I have lost or gained 10 pounds in the last six months 0 No I am not always physically able to shop, cook and/or feed myself 0 No Nutrition Protocols Good Risk Protocol 0 No interventions needed Moderate Risk Protocol High Risk Proctocol Risk Level: Good Risk Score: 0 Electronic Signature(s) Signed: 05/06/2022 5:07:08 PM By: Angelina Pih Entered By: Angelina Pih on 05/06/2022 08:10:59

## 2022-05-07 NOTE — Progress Notes (Signed)
[place], Beth Carroll ([id number]) [id number] Page 1 of 12 Visit Report for 05/06/2022 Allergy List Details Patient Name: Date of Service: Beth Carroll, Beth Carroll [facility] 05/06/2022 8:15 A M Medical Record Number: [id number] Patient Account Number: [id number] Date of Birth/Sex: Treating RN: [birth year] (52 y.o. [name] Primary Care Donn Zanetti: Marcelino Duster Other Clinician: Referring Alailah Safley: Treating Serai Tukes/Extender: Youlanda Roys, Vivianne Spence in Treatment: 0 Allergies Active Allergies No Known Drug Allergies Allergy Notes Electronic Signature(s) Signed: 05/06/2022 5:07:08 PM By: Angelina Pih Entered By: Angelina Pih on 05/06/2022 08:05:03 -------------------------------------------------------------------------------- Arrival Information Details Patient Name: Date of Service: Beth Carroll, Beth Carroll [facility] 05/06/2022 8:15 A M Medical Record Number: [id number] Patient Account Number: [id number] Date of Birth/Sex: Treating RN: 1970/08/15 (52 y.o. [name] Primary Care Artez Regis: Marcelino Duster Other Clinician: Referring Jeilani Grupe: Treating Remberto Lienhard/Extender: Joylene Grapes in Treatment: 0 Visit Information Patient Arrived: Wheel Chair Arrival Time: 07:56 Accompanied By: spouse Transfer Assistance: None Patient Identification Verified: Yes Secondary Verification Process Completed: Yes Patient Has Alerts: Yes Patient Alerts: Patient on Blood Thinner type 2 diabetic ABI 02/02/22 L 1.12 ABI 02/02/22 R 1.17 TBI 02/02/22 L 0.93 TBI 02/02/22 R 0.90 Electronic Signature(s) Signed: 05/06/2022 4:46:02 PM By: Angelina Pih Entered By: Angelina Pih on 05/06/2022 16:46:02 -------------------------------------------------------------------------------- Clinic Level of Care Assessment Details Patient Name: Date of Service: Beth Carroll, Beth Carroll 05/06/2022 8:15 A M Medical Record Number: [id number] Patient Account Number: [id number] Date of Birth/Sex: Treating  RN: 12/07/1970 (52 y.o. [name] Primary Care Gilda Abboud: Marcelino Duster Other Clinician: Referring Adelard Sanon: Treating Severo Beber/Extender: Joylene Grapes in Treatment: 0 Clinic Level of Care Assessment Items TOOL 2 Quantity Score []  - 0 Use when only an EandM is performed on the INITIAL visit [place], Beth Carroll ([id number]) [id number] Page 2 of 12 ASSESSMENTS - Nursing Assessment / Reassessment X- 1 20 General Physical Exam (combine w/ comprehensive assessment (listed just below) when performed on new pt. evals) X- 1 25 Comprehensive Assessment (HX, ROS, Risk Assessments, Wounds Hx, etc.) ASSESSMENTS - Wound and Skin A ssessment / Reassessment []  - 0 Simple Wound Assessment / Reassessment - one wound X- 6 5 Complex Wound Assessment / Reassessment - multiple wounds []  - 0 Dermatologic / Skin Assessment (not related to wound area) ASSESSMENTS - Ostomy and/or Continence Assessment and Care []  - 0 Incontinence Assessment and Management []  - 0 Ostomy Care Assessment and Management (repouching, etc.) PROCESS - Coordination of Care X - Simple Patient / Family Education for ongoing care 1 15 []  - 0 Complex (extensive) Patient / Family Education for ongoing care X- 1 10 Staff obtains Chiropractor, Records, T Results / Process Orders est []  - 0 Staff telephones HHA, Nursing Homes / Clarify orders / etc []  - 0 Routine Transfer to another Facility (non-emergent condition) []  - 0 Routine Hospital Admission (non-emergent condition) X- 1 15 New Admissions / Manufacturing engineer / Ordering NPWT Apligraf, etc. , []  - 0 Emergency Hospital Admission (emergent condition) X- 1 10 Simple Discharge Coordination []  - 0 Complex (extensive) Discharge Coordination PROCESS - Special Needs []  - 0 Pediatric / Minor Patient Management []  - 0 Isolation Patient Management []  - 0 Hearing / Language / Visual special needs []  - 0 Assessment of  Community assistance (transportation, D/C planning, etc.) []  - 0 Additional assistance / Altered mentation []  - 0 Support Surface(s) Assessment (bed, cushion, seat, etc.) INTERVENTIONS - Wound Cleansing / Measurement X- 1 5 Wound Imaging (photographs - any number of wounds) []  - 0 Wound Tracing (instead of photographs) []  -  0 Simple Wound Measurement - one wound X- 6 5 Complex Wound Measurement - multiple wounds []  - 0 Simple Wound Cleansing - one wound X- 6 5 Complex Wound Cleansing - multiple wounds INTERVENTIONS - Wound Dressings X - Small Wound Dressing one or multiple wounds 6 10 []  - 0 Medium Wound Dressing one or multiple wounds []  - 0 Large Wound Dressing one or multiple wounds []  - 0 Application of Medications - injection INTERVENTIONS - Miscellaneous []  - 0 External ear exam []  - 0 Specimen Collection (cultures, biopsies, blood, body fluids, etc.) []  - 0 Specimen(s) / Culture(s) sent or taken to Lab for analysis []  - 0 Patient Transfer (multiple staff / Teddy Spike / Similar devices) [place], Beth Carroll ([id number]) [id number] Page 3 of 12 []  - 0 Simple Staple / Suture removal (25 or less) []  - 0 Complex Staple / Suture removal (26 or more) []  - 0 Hypo / Hyperglycemic Management (close monitor of Blood Glucose) []  - 0 Ankle / Brachial Index (ABI) - do not check if billed separately Has the patient been seen at the hospital within the last three years: Yes Total Score: 250 Level Of Care: New/Established - Level 5 Electronic Signature(s) Signed: 05/06/2022 5:07:08 PM By: Angelina Pih Entered By: Angelina Pih on 05/06/2022 16:52:45 -------------------------------------------------------------------------------- Encounter Discharge Information Details Patient Name: Date of Service: Beth Carroll, Beth Carroll [facility] 05/06/2022 8:15 A M Medical Record Number: [id number] Patient Account Number: [id number] Date of Birth/Sex: Treating RN: [birth year] (52  y.o. [name] Primary Care Reann Dobias: Marcelino Duster Other Clinician: Referring Baldomero Mirarchi: Treating Kamauri Kathol/Extender: Joylene Grapes in Treatment: 0 Encounter Discharge Information Items Discharge Condition: Stable Ambulatory Status: Wheelchair Discharge Destination: Home Transportation: Other Accompanied By: family Schedule Follow-up Appointment: Yes Clinical Summary of Care: Electronic Signature(s) Signed: 05/06/2022 4:56:07 PM By: Angelina Pih Entered By: Angelina Pih on 05/06/2022 16:56:07 -------------------------------------------------------------------------------- Lower Extremity Assessment Details Patient Name: Date of Service: Beth Carroll, Beth Carroll 05/06/2022 8:15 A M Medical Record Number: [id number] Patient Account Number: [id number] Date of Birth/Sex: Treating RN: Jul 12, 1970 (52 y.o. [name] Primary Care Sarahann Horrell: Marcelino Duster Other Clinician: Referring Dyron Kawano: Treating Haevyn Ury/Extender: Joylene Grapes in Treatment: 0 Edema Assessment Assessed: [Left: No] [Right: No] Edema: [Left: Yes] [Right: Yes] Calf Left: Right: Point of Measurement: 30 cm From Medial Instep 31.6 cm 35.5 cm Ankle Left: Right: Point of Measurement: 10 cm From Medial Instep 28.3 cm 27.6 cm Vascular Assessment Pulses: Dorsalis Pedis Doppler Audible: [Left:Yes] [Right:Yes] Posterior Tibial Doppler Audible: [Left:Yes] [Right:Yes [id number] Page 4 of 12] Electronic Signature(s) Signed: 05/06/2022 5:07:08 PM By: Angelina Pih Entered By: Angelina Pih on 05/06/2022 08:41:06 -------------------------------------------------------------------------------- Multi-Disciplinary Care Plan Details Patient Name: Date of Service: Beth Carroll, Beth Carroll [facility] 05/06/2022 8:15 A M Medical Record Number: [id number] Patient Account Number: [id number] Date of Birth/Sex: Treating RN: 12-20-1970 (52 y.o. [name] Primary  Care Marji Kuehnel: Marcelino Duster Other Clinician: Referring Damarie Schoolfield: Treating Thierry Dobosz/Extender: Joylene Grapes in Treatment: 0 Active Inactive Necrotic Tissue Nursing Diagnoses: Impaired tissue integrity related to necrotic/devitalized tissue Knowledge deficit related to management of necrotic/devitalized tissue Goals: Necrotic/devitalized tissue will be minimized in the wound bed Date Initiated: 05/06/2022 Target Resolution Date: 06/04/2022 Goal Status: Active Patient/caregiver will verbalize understanding of reason and process for debridement of necrotic tissue Date Initiated: 05/06/2022 Target Resolution Date: 06/04/2022 Goal Status: Active Interventions: Assess patient pain level pre-, during and post procedure and prior to discharge Provide education on necrotic tissue and debridement process Treatment Activities: Apply topical anesthetic as ordered :  05/06/2022 Notes: Wound/Skin Impairment Nursing Diagnoses: Impaired tissue integrity Knowledge deficit related to ulceration/compromised skin integrity Goals: Ulcer/skin breakdown will have a volume reduction of 30% by week 4 Date Initiated: 05/06/2022 Target Resolution Date: 06/03/2022 Goal Status: Active Ulcer/skin breakdown will have a volume reduction of 50% by week 8 Date Initiated: 05/06/2022 Target Resolution Date: 07/01/2022 Goal Status: Active Ulcer/skin breakdown will have a volume reduction of 80% by week 12 Date Initiated: 05/06/2022 Target Resolution Date: 07/29/2022 Goal Status: Active Ulcer/skin breakdown will heal within 14 weeks Date Initiated: 05/06/2022 Target Resolution Date: 08/12/2022 Goal Status: Active Interventions: Assess patient/caregiver ability to obtain necessary supplies Assess patient/caregiver ability to perform ulcer/skin care regimen upon admission and as needed Assess ulceration(s) every visit Provide education on ulcer and skin care Treatment Activities: Skin care regimen  initiated : 05/06/2022 Notes: Electronic Signature(s) [place], Beth Carroll ([id number]) [id number] Page 5 of 12 Signed: 05/06/2022 4:55:01 PM By: Angelina Pih Entered By: Angelina Pih on 05/06/2022 16:55:01 -------------------------------------------------------------------------------- Pain Assessment Details Patient Name: Date of Service: Beth Carroll, Beth Carroll 05/06/2022 8:15 A M Medical Record Number: [id number] Patient Account Number: [id number] Date of Birth/Sex: Treating RN: 03-Dec-1970 (52 y.o. [name] Primary Care Ganesh Deeg: Marcelino Duster Other Clinician: Referring Ysabel Cowgill: Treating Kevan Prouty/Extender: Joylene Grapes in Treatment: 0 Active Problems Location of Pain Severity and Description of Pain Patient Has Paino Yes Site Locations Rate the pain. Current Pain Level: 8 Pain Management and Medication Current Pain Management: Notes pt states pain in right lower leg Electronic Signature(s) Signed: 05/06/2022 5:07:08 PM By: Angelina Pih Entered By: Angelina Pih on 05/06/2022 08:03:13 -------------------------------------------------------------------------------- Patient/Caregiver Education Details Patient Name: Date of Service: Beth Carroll 4/11/2024andnbsp8:15 A M Medical Record Number: [id number] Patient Account Number: [id number] Date of Birth/Gender: Treating RN: 1970-10-12 (52 y.o. [name] Primary Care Physician: Marcelino Duster Other Clinician: Referring Physician: Treating Physician/Extender: Joylene Grapes in Treatment: 0 Education Assessment Education Provided To: Patient Education Topics Provided Tissue Oxygenation: Handouts: Peripheral Arterial Disease and Related Ulcers Methods: Explain/Verbal Responses: State content correctly Welcome T [facility]-New Patient PacketJassica Zazueta, Beth Carroll ([id number]) [id number] Page 6 of 12 Handouts:  [facility] P[facility] form, Welcome T [facility] o Methods: Explain/Verbal Responses: State content correctly Wound/Skin Impairment: Handouts: Caring for Your Ulcer Methods: Explain/Verbal Responses: State content correctly Electronic Signature(s) Signed: 05/06/2022 5:07:08 PM By: Angelina Pih Entered By: Angelina Pih on 05/06/2022 16:55:25 -------------------------------------------------------------------------------- Wound Assessment Details Patient Name: Date of Service: Beth Carroll, Beth Carroll 05/06/2022 8:15 A M Medical Record Number: [id number] Patient Account Number: [id number] Date of Birth/Sex: Treating RN: 25-Sep-1970 (52 y.o. 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 Second oe Wound Open Wounding Event: Gradually Appeared Status: Date Acquired: 04/21/2022 Comorbid Congestive Heart Failure, Hypertension, Peripheral Arterial Weeks Of Treatment: 0 History: Disease, Type II Diabetes Clustered Wound: No Pending Amputation On Presentation Photos Wound Measurements Length: (cm) Width: (cm) Depth: (cm) Area: (cm) Volume: (cm) 2 % Reduction in Area: 1.7 % Reduction in Volume: 0.1 2.67 0.267 Wound Description Classification: Full Thickness Without Exposed Suppor Exudate Amount: Medium Exudate Type: Serosanguineous Exudate Color: red, brown t Structures Foul Odor After Cleansing: No Slough/Fibrino Yes Wound Bed Granulation Amount: None Present (0%) Necrotic Amount: Large (67-100%) Necrotic Quality: Eschar, Adherent Slough Treatment Notes Wound #1 (Toe Second) Wound Laterality: Right Cleanser [place],  Beth Carroll ([id number]) [id number] Page 7 of 12 Soap and Water Discharge Instruction: Gently  cleanse wound with antibacterial soap, rinse and pat dry prior to dressing wounds Peri-Wound Care Topical Primary Dressing Silvercel 4 1/4x 4 1/4 (in/in) Discharge Instruction: Apply Silvercel 4 1/4x 4 1/4 (in/in) as instructed Secondary Dressing ABD Pad 5x9 (in/in) Discharge Instruction: Cover with ABD pad Secured With Medipore T - 35M Medipore H Soft Cloth Surgical T ape ape, 2x2 (in/yd) Kerlix Roll Sterile or Non-Sterile 6-ply 4.5x4 (yd/yd) Discharge Instruction: Apply Kerlix as directed Compression Wrap Compression Stockings Add-Ons Electronic Signature(s) Signed: 05/06/2022 5:07:08 PM By: Angelina Pih Entered By: Angelina Pih on 05/06/2022 10:44:55 -------------------------------------------------------------------------------- Wound Assessment Details Patient Name: Date of Service: Beth Carroll, Beth Carroll 05/06/2022 8:15 A M Medical Record Number: [id number] Patient Account Number: [id number] Date of Birth/Sex: Treating RN: [birth year] (52 y.o. 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 Great oe Wound Open Wounding Event: Gradually Appeared Status: Date Acquired: 04/21/2022 Comorbid Congestive Heart Failure, Hypertension, Peripheral Arterial Weeks Of Treatment: 0 History: Disease, Type II Diabetes Clustered Wound: No Photos Wound Measurements Length: (cm) 0.1 Width: (cm) 0.1 Depth: (cm) 0.1 Area: (cm) 0.008 Volume: (cm) 0.001 % Reduction in Area: % Reduction in Volume: Epithelialization: None Tunneling: No Undermining: No Wound Description Classification: Partial Thickness Exudate Amount: None Present Mathew, Ronetta ([id number]) Foul Odor After Cleansing: No Slough/Fibrino No [id number] Page 8 of 12 Wound  Bed Granulation Amount: None Present (0%) Assessment Notes toe is discolored Electronic Signature(s) Signed: 05/06/2022 5:07:08 PM By: Angelina Pih Entered By: Angelina Pih on 05/06/2022 08:43:30 -------------------------------------------------------------------------------- Wound Assessment Details Patient Name: Date of Service: Beth Carroll, Beth Carroll 05/06/2022 8:15 A M Medical Record Number: [id number] Patient Account Number: [id number] Date of Birth/Sex: Treating RN: Jul 26, 1970 (52 y.o. [name] Primary Care Lebaron Bautch: Marcelino Duster Other Clinician: Referring Barnabas Henriques: Treating Kirrah Mustin/Extender: Joylene Grapes in Treatment: 0 Wound Status Wound Number: 3 Primary Pressure Ulcer Etiology: Wound Location: Midline Sacrum Wound Open Wounding Event: Gradually Appeared Status: Date Acquired: 04/21/2022 Comorbid Congestive Heart Failure, Hypertension, Peripheral Arterial Weeks Of Treatment: 0 History: Disease, Type II Diabetes Clustered Wound: No Photos Wound Measurements Length: (cm) 1 Width: (cm) 0.3 Depth: (cm) 0.1 Area: (cm) 0.236 Volume: (cm) 0.024 % Reduction in Area: % Reduction in Volume: Epithelialization: None Tunneling: No Undermining: No Wound Description Classification: Category/Stage II Exudate Amount: None Present Foul Odor After Cleansing: No Slough/Fibrino Yes Wound Bed Granulation Amount: Large (67-100%) Granulation Quality: Red, Pink Necrotic Amount: Small (1-33%) Necrotic Quality: Adherent Slough Electronic Signature(s) Signed: 05/06/2022 5:07:08 PM By: Angelina Pih Previous Signature: 05/06/2022 8:54:56 AM Version By: Angelina Pih Entered By: Angelina Pih on 05/06/2022 09:07:22 Beth Carroll ([id number]) [id number] Page 9 of 12 -------------------------------------------------------------------------------- Wound Assessment Details Patient Name: Date of Service: LATARRA, [place]  05/06/2022 8:15 A M Medical Record Number: [id number] Patient Account Number: [id number] Date of Birth/Sex: Treating RN: [birth year] (52 y.o. 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 Wound Open Wounding Event: Gradually Appeared Status: Date Acquired: 04/21/2022 Comorbid Congestive Heart Failure, Hypertension, Peripheral Arterial Weeks Of Treatment: 0 History: Disease, Type II Diabetes Clustered Wound: No Photos Wound Measurements Length: (cm) 6 Width: (cm) 5 Depth: (cm)  0.1 Area: (cm) 23.562 Volume: (cm) 2.356 % Reduction in Area: % Reduction in Volume: Epithelialization: None Tunneling: No Undermining: No Wound Description Classification: Full Thickness Without Exposed Support Structures Exudate Amount: Medium Exudate Type: Serosanguineous Exudate Color: red, brown Foul Odor After Cleansing: No Slough/Fibrino Yes Wound Bed Granulation Amount: Medium (34-66%) Exposed Structure Granulation Quality: Red, Pink Fat Layer (Subcutaneous Tissue) Exposed: Yes Necrotic Amount: Medium (34-66%) Necrotic Quality: Adherent Slough Treatment Notes Wound #5 (Calcaneus) Wound Laterality: Right Cleanser Soap and Water Discharge Instruction: Gently cleanse wound with antibacterial soap, rinse and pat dry prior to dressing wounds Peri-Wound Care Topical Primary Dressing Silvercel 4 1/4x 4 1/4 (in/in) Discharge Instruction: Apply Silvercel 4 1/4x 4 1/4 (in/in) as instructed Secondary Dressing ABD Pad 5x9 (in/in) Discharge Instruction: Cover with ABD pad Secured With Medipore T - 35M Medipore H Soft Cloth Surgical T ape ape, 2x2 (in/yd) Kerlix Roll Sterile or Non-Sterile 6-ply 4.5x4 (yd/yd) Discharge Instruction: Apply Kerlix as  directed [place], Beth Carroll ([id number]) [id number] Page 10 of 12 Compression Wrap Compression Stockings Add-Ons Electronic Signature(s) Signed: 05/06/2022 5:07:08 PM By: Angelina Pih Entered By: Angelina Pih on 05/06/2022 08:46:36 -------------------------------------------------------------------------------- Wound Assessment Details Patient Name: Date of Service: MYLEE, FALIN 05/06/2022 8:15 A M Medical Record Number: [id number] Patient Account Number: [id number] Date of Birth/Sex: Treating RN: 31-May-1970 (52 y.o. 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 Fourth oe Wound Open Wounding Event: Gradually Appeared Status: Date Acquired: 04/21/2022 Comorbid Congestive Heart Failure, Hypertension, Peripheral Arterial Weeks Of Treatment: 0 History: Disease, Type II Diabetes Clustered Wound: No Photos Wound Measurements Length: (cm) Width: (cm) Depth: (cm) Area: (cm) Volume: (cm) 5 % Reduction in Area: 0.7 % Reduction in Volume: 0.1 2.749 0.275 Wound Description Classification: Full Thickness Without Exposed Suppor Exudate Amount: Medium Exudate Type: Serosanguineous Exudate Color: red, brown t Structures Foul Odor After Cleansing: No Slough/Fibrino Yes Wound Bed Granulation Amount: Large (67-100%) Exposed Structure Granulation Quality: Red, Pink Fat Layer (Subcutaneous Tissue) Exposed: Yes Necrotic Amount: Small (1-33%) Necrotic Quality: Adherent Slough Electronic Signature(s) Signed: 05/06/2022 5:07:08 PM By: Angelina Pih Entered By: Angelina Pih on 05/06/2022 10:43:19 Wound Assessment Details -------------------------------------------------------------------------------- Beth Carroll ([id number])  [id number] Page 11 of 12 Patient Name: Date of Service: AMIRRAH, QUIGLEY [facility] 05/06/2022 8:15 A M Medical Record Number: [id number] Patient Account Number: [id number] Date of Birth/Sex: Treating RN: Nov 28, 1970 (52 y.o. 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 Fifth oe Wound Open Wounding Event: Gradually Appeared Status: Date Acquired: 04/21/2022 Comorbid Congestive Heart Failure, Hypertension, Peripheral Arterial Weeks Of Treatment: 0 History: Disease, Type II Diabetes Clustered Wound: No Pending Amputation On Presentation Photos Wound Measurements Length: (cm) 0.5 % Reduction in Area: Width: (cm) 0.5 % Reduction in Volume: Depth: (cm) 0.1 Epithelialization: None Area: (cm) 0.196 Tunneling: No Volume: (cm) 0.02 Undermining: No Wound Description Classification: Full Thickness Without Exposed Support Structures Foul Odor After Cleansing: No Exudate Amount: Medium Slough/Fibrino Yes Exudate Type: Serosanguineous Exudate Color: red, brown Wound Bed Granulation Amount: Large (67-100%) Exposed Structure Granulation Quality: Red, Pink Fat Layer (Subcutaneous Tissue) Exposed: Yes Necrotic Amount: Small (1-33%) Necrotic Quality: Adherent Scientist, physiological) Signed: 05/06/2022 5:07:08 PM By: Angelina Pih Entered By: Angelina Pih on 05/06/2022  10:44:30 -------------------------------------------------------------------------------- Vitals Details Patient Name: Date of Service: GLORINE, HANRATTY [facility] 05/06/2022 8:15 A M Medical Record Number: [id number] Patient Account Number: [id number] Date of Birth/Sex: Treating RN: [birth year] (52 y.o. [name] Primary Care Haliey Romberg: Marcelino Duster Other  Clinician: Referring Konor Noren: Treating Ike Maragh/Extender: Joylene Grapes in Treatment: 0 Vital Signs Time Taken: 08:03 Temperature (F): 98 Height (in): 66 Pulse (bpm): 80 Source: Stated Respiratory Rate (breaths/min): 18 Weight (lbs): 118 Blood Pressure (mmHg): 93/64 Source: Stated Reference Range: 80 - 120 mg / dl Body Mass Index (BMI): 19 Holm, Miara ([id number]) [id number] Page 12 of 12 Notes pt asymptomatic with lower BP Electronic Signature(s) Signed: 05/06/2022 5:07:08 PM By: Angelina Pih Entered By: Angelina Pih on 05/06/2022 08:03:51

## 2022-05-13 ENCOUNTER — Encounter: Payer: Medicaid Other | Admitting: Physician Assistant

## 2022-05-13 DIAGNOSIS — E11621 Type 2 diabetes mellitus with foot ulcer: Secondary | ICD-10-CM | POA: Diagnosis not present

## 2022-05-13 NOTE — Progress Notes (Addendum)
[place], [name] ([id number]) [id number] Page 1 of 8 Visit Report for 05/13/2022 Chief Complaint Document Details Patient Name: Date of Service: RANEEM, MENDOLIA [facility] 05/13/2022 2:00 PM Medical Record Number: [id number] Patient Account Number: [id number] Date of Birth/Sex: Treating RN: March 15, 1970 (52 y.o. [name] Primary Care Provider: Marcelino Duster Other Clinician: Referring Provider: Treating Provider/Extender: Joylene Grapes in Treatment: 1 Information Obtained from: Patient Chief Complaint Bilateral LE Ulcers Electronic Signature(s) Signed: 05/13/2022 2:45:27 PM By: Allen Derry PA-C Entered By: Allen Derry on 05/13/2022 14:45:27 -------------------------------------------------------------------------------- Debridement Details Patient Name: Date of Service: SHANNAH, CONTEH [facility] 05/13/2022 2:00 PM Medical Record Number: [id number] Patient Account Number: [id number] Date of Birth/Sex: Treating RN: December 15, 1970 (52 y.o. [name] Primary Care Provider: Marcelino Duster Other Clinician: Referring Provider: Treating Provider/Extender: Joylene Grapes in Treatment: 1 Debridement Performed for Assessment: Wound #5 Right Calcaneus Performed By: Physician Allen Derry, PA-C Debridement Type: Debridement Severity of Tissue Pre Debridement: Fat layer exposed Level of Consciousness (Pre-procedure): Awake and Alert Pre-procedure Verification/Time Out Yes - 14:49 Taken: T Area Debrided (L x W): otal 4 (cm) x 5 (cm) = 20 (cm) Tissue and other material debrided: Viable, Non-Viable, Eschar, Slough, Subcutaneous, Slough Level: Skin/Subcutaneous Tissue Debridement Description: Excisional Instrument: Curette Bleeding: Moderate Hemostasis Achieved: Pressure Response to Treatment: Procedure was tolerated well Level of Consciousness (Post- Awake and Alert procedure): Post Debridement Measurements of Total Wound Length: (cm)  4 Width: (cm) 5 Depth: (cm) 0.1 Volume: (cm) 1.571 Character of Wound/Ulcer Post Debridement: Stable Severity of Tissue Post Debridement: Fat layer exposed Post Procedure Diagnosis Same as Pre-procedure Electronic Signature(s) Signed: 05/13/2022 5:10:54 PM By: Angelina Pih Signed: 05/14/2022 1:44:50 PM By: Allen Derry PA-C Entered By: Angelina Pih on 05/13/2022 14:54:15 [name] ([id number]) [id number] Page 2 of 8 -------------------------------------------------------------------------------- HPI Details Patient Name: Date of Service: RAPHAEL, ESPE 05/13/2022 2:00 PM Medical Record Number: [id number] Patient Account Number: [id number] Date of Birth/Sex: Treating RN: [birth year] (52 y.o. [name] Primary Care Provider: Marcelino Duster Other Clinician: Referring Provider: Treating Provider/Extender: Joylene Grapes in Treatment: 1 History of Present Illness HPI Description: 05-07-2022 upon evaluation today patient appears to be doing somewhat poorly in regard to her right foot in general. She subsequently also has a wound on the sacral area but this is a very superficial stage II pressure ulcer. Nonetheless the foot in particular although I could not get in epic I gleaned is much as I could from her story of what exactly happened here. T backtrack it does appear that she was in the hospital for diabetic ketoacidosis and this o was back in March 2024. 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 She ended up having a nurse evaluate this and ended up with a stat angiography to go in and find what was going on they were able to identify the clot clear this out and subsequently  restore blood flow. However this was not without already having damage to the distal portion of her toes. With that being said the good news is she seems to have good blood flow at the moment I was able to recheck and that we were able to get a good pulse with Doppler. With that being  said I think that a lot of the injury to her toes is more of a degloving type injury at this point which hopefully will heal the first and second toes are the main ones that are of concern in my opinion the others I think just have blisters which will come off easily and should heal quite readily. The patient tells me that her blood sugars are still elevated but not as bad as what they have been previous. Again she has had multiple instances of having fairly significant DKA based on what I am hearing. Patient does have a history of peripheral vascular disease of the right lower extremity, stage II pressure ulcer to the sacrum that occurred while she was in the hospital, diabetes mellitus type 2, congestive heart failure, and hypertension. Her most recent hemoglobin A1c was 9.8 that was on 04-23-2022 05-13-2022 upon evaluation today patient appears to be doing decently well with regard to her wounds in general. With that being said it is the great toe and the second toe and the most concerned I am not certain that the tip of the great toe survive. Nonetheless I am good I have a look at what we can see today. Electronic Signature(s) Signed: 05/14/2022 1:17:20 PM By: Allen Derry PA-C Entered By: Allen Derry on 05/14/2022 13:17:20 -------------------------------------------------------------------------------- Physical Exam Details Patient Name: Date of Service: CHOUA, IKNER 05/13/2022 2:00 PM Medical Record Number: [id number] Patient Account Number: [id number] Date of Birth/Sex: Treating RN: 13-Nov-1970 (52 y.o. [name] Primary Care Provider: Marcelino Duster Other Clinician: Referring Provider: Treating  Provider/Extender: Joylene Grapes in Treatment: 1 Constitutional Well-nourished and well-hydrated in no acute distress. Respiratory normal breathing without difficulty. Psychiatric this patient is able to make decisions and demonstrates good insight into disease process. Alert and Oriented x 3. pleasant and cooperative. Notes Upon inspection patient's wound bed actually showed signs of good granulation in a lot of areas I did perform some debridement on the heel nothing else really was able to be debrided that require debridement at this point the great toe I think may not have survived the incident but nonetheless outside of that I think the second toe is probably going to be able to be salvaged I just think that she may have lost too much blood flow during the vascular episode to salvage the great toe. Electronic Signature(s) Signed: 05/14/2022 1:17:52 PM By: Allen Derry PA-C Entered By: Allen Derry on 05/14/2022 13:17:52 -------------------------------------------------------------------------------- Physician Orders Details Patient Name: Date of Service: SHACOLA, SCHUSSLER [facility] 05/13/2022 2:00 PM Medical Record Number: [id number] Patient Account Number: [id number] Date of Birth/Sex: Treating RN: Mar 27, 1970 (52 y.o. [name] Primary Care Provider: Marcelino Duster Other Clinician: Referring Provider: Treating Provider/Extender: Minette Headland H[place], [place] ([id number]) [id number] Page 3 of 8 Weeks in Treatment: 1 Verbal / Phone Orders: No Diagnosis Coding ICD-10 Coding Code Description I73.89 Other specified peripheral vascular diseases L97.512 Non-pressure chronic ulcer of other part of right foot with fat layer exposed L97.522 Non-pressure chronic ulcer of other part of left foot with fat layer exposed L89.152 Pressure ulcer of sacral region, stage 2 L89.616 Pressure-induced deep tissue damage of right heel L89.626  Pressure-induced deep tissue damage of left heel 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 Home Health Home Health Company: - [facility] for wound care. May utilize formulary equivalent dressing for  wound treatment orders unless otherwise specified. Home Health Nurse may visit PRN to address patients wound care needs. Scheduled days for dressing changes to be completed; exception, patient has scheduled wound care visit that day. **Please direct any NON-WOUND related issues/requests for orders to patient's Primary Care Physician. **If current dressing causes regression in wound condition, may D/C ordered dressing product/s and apply Normal Saline Moist Dressing daily until next Wound Healing Center or Other MD appointment. **Notify Wound Healing Center of regression in wound condition at [phone]. Bathing/ [employer] wounds with antibacterial soap and water. - dial soap recommended May shower; gently cleanse wound with antibacterial soap, rinse and pat dry prior to dressing wounds No tub bath. Anesthetic (Use 'Patient Medications' Section for Anesthetic Order Entry) Lidocaine applied to wound bed Edema Control - Lymphedema / Segmental Compressive Device / Other Elevate, Exercise Daily and A void Standing for Long Periods of Time. Elevate legs to the level of the heart and pump ankles as often as possible Elevate leg(s) parallel to the floor when sitting. DO YOUR BEST to sleep in the bed at night. DO NOT sleep in your recliner. Long hours of sitting in a recliner leads to swelling of the legs and/or potential wounds on your backside. Off-Loading Turn and reposition every 2 hours - keep pressure off sacrum Wound Treatment Wound #1 - T Second oe Wound Laterality:  Right Cleanser: Soap and Water 1 x Per Day/30 Days Discharge Instructions: Gently cleanse wound with antibacterial soap, rinse and pat dry prior to dressing wounds Topical: Betadine 1 x Per Day/30 Days Discharge Instructions: Apply betadine as directed. Prim Dressing: Gauze 1 x Per Day/30 Days ary Discharge Instructions: As directed: dry Secondary Dressing: ABD Pad 5x9 (in/in) 1 x Per Day/30 Days Discharge Instructions: Cover with ABD pad Secured With: Medipore T - 12M Medipore H Soft Cloth Surgical T ape ape, 2x2 (in/yd) 1 x Per Day/30 Days Secured With: [employer] Sterile or Non-Sterile 6-ply 4.5x4 (yd/yd) 1 x Per Day/30 Days Discharge Instructions: Apply Kerlix as directed Wound #2 - T Great oe Wound Laterality: Right Cleanser: Soap and Water 1 x Per Day/30 Days Discharge Instructions: Gently cleanse wound with antibacterial soap, rinse and pat dry prior to dressing wounds Topical: Betadine 1 x Per Day/30 Days Discharge Instructions: Apply betadine as directed. Prim Dressing: Gauze 1 x Per Day/30 Days ary Discharge Instructions: As directed: dry PAHOLA, DIMMITT ([id number]) [id number] Page 4 of 8 Secondary Dressing: ABD Pad 5x9 (in/in) 1 x Per Day/30 Days Discharge Instructions: Cover with ABD pad Secured With: Medipore T - 12M Medipore H Soft Cloth Surgical T ape ape, 2x2 (in/yd) 1 x Per Day/30 Days Secured With: [employer] Sterile or Non-Sterile 6-ply 4.5x4 (yd/yd) 1 x Per Day/30 Days Discharge Instructions: Apply Kerlix as directed Wound #5 - Calcaneus Wound Laterality: Right Cleanser: Soap and Water 3 x Per Week/30 Days Discharge Instructions: Gently cleanse wound with antibacterial soap, rinse and pat dry prior to dressing wounds Prim Dressing: Silvercel 4 1/4x 4 1/4 (in/in) 3 x Per Week/30 Days ary Discharge Instructions: Apply Silvercel 4 1/4x 4 1/4 (in/in) as instructed Secondary Dressing: ABD Pad 5x9 (in/in) 3 x Per Week/30 Days Discharge  Instructions: Cover with ABD pad Secured With: Medipore T - 12M Medipore H Soft Cloth Surgical T ape ape, 2x2 (in/yd) 3 x Per Week/30 Days Secured With: [employer] Sterile or Non-Sterile 6-ply 4.5x4 (yd/yd) 3 x Per Week/30 Days Discharge Instructions: Apply Kerlix as directed Wound #6 - T Third  oe Wound Laterality: Right Cleanser: Soap and Water 1 x Per Day/30 Days Discharge Instructions: Gently cleanse wound with antibacterial soap, rinse and pat dry prior to dressing wounds Topical: Betadine 1 x Per Day/30 Days Discharge Instructions: Apply betadine as directed. Prim Dressing: Gauze 1 x Per Day/30 Days ary Discharge Instructions: As directed: dry Secondary Dressing: ABD Pad 5x9 (in/in) 1 x Per Day/30 Days Discharge Instructions: Cover with ABD pad Secured With: Medipore T - 70M Medipore H Soft Cloth Surgical T ape ape, 2x2 (in/yd) 1 x Per Day/30 Days Secured With: [employer] Sterile or Non-Sterile 6-ply 4.5x4 (yd/yd) 1 x Per Day/30 Days Discharge Instructions: Apply Kerlix as directed Electronic Signature(s) Signed: 05/13/2022 5:10:54 PM By: Angelina Pih Signed: 05/14/2022 1:44:50 PM By: Allen Derry PA-C Entered By: Angelina Pih on 05/13/2022 15:10:37 -------------------------------------------------------------------------------- Problem List Details Patient Name: Date of Service: JENAFER, WINTERTON [facility] 05/13/2022 2:00 PM Medical Record Number: [id number] Patient Account Number: [id number] Date of Birth/Sex: Treating RN: 08/15/1970 (52 y.o. [name] Primary Care Provider: Marcelino Duster Other Clinician: Referring Provider: Treating Provider/Extender: Joylene Grapes in Treatment: 1 Active Problems ICD-10 Encounter Code Description Active Date MDM Diagnosis I73.89 Other specified peripheral vascular diseases 05/06/2022 No Yes L97.512 Non-pressure chronic ulcer of other part of right foot with fat layer exposed 05/06/2022 No Yes [place], [name]  ([id number]) [id number] Page 5 of 8 [phone] Non-pressure chronic ulcer of other part of left foot with fat layer exposed 05/06/2022 No Yes L89.152 Pressure ulcer of sacral region, stage 2 05/06/2022 No Yes L89.616 Pressure-induced deep tissue damage of right heel 05/06/2022 No Yes L89.626 Pressure-induced deep tissue damage of left heel 05/06/2022 No Yes E11.621 Type 2 diabetes mellitus with foot ulcer 05/06/2022 No Yes I50.42 Chronic combined systolic (congestive) and diastolic (congestive) heart failure 05/06/2022 No Yes I10 Essential (primary) hypertension 05/06/2022 No Yes Inactive Problems Resolved Problems Electronic Signature(s) Signed: 05/13/2022 2:45:22 PM By: Allen Derry PA-C Entered By: Allen Derry on 05/13/2022 14:45:22 -------------------------------------------------------------------------------- Progress Note Details Patient Name: Date of Service: MALCOLM, HETZ 05/13/2022 2:00 PM Medical Record Number: [id number] Patient Account Number: [id number] Date of Birth/Sex: Treating RN: [birth year] (53 y.o. [name] Primary Care Provider: Marcelino Duster Other Clinician: Referring Provider: Treating Provider/Extender: Joylene Grapes in Treatment: 1 Subjective Chief Complaint Information obtained from Patient Bilateral LE Ulcers History of Present Illness (HPI) 05-07-2022 upon evaluation today patient appears to be doing somewhat poorly in regard to her right foot in general. She subsequently also has a wound on the sacral area but this is a very superficial stage II pressure ulcer. Nonetheless the foot in particular although I could not get in epic I gleaned is much as I could from her story of what exactly happened here. T backtrack it does appear that she was in the hospital for diabetic ketoacidosis and this was back in March o 2024. With that being said during the time that she was in the hospital she also apparently had an  issue with having a thrombus in the right leg in the ankle/foot region which ended up with her having essentially complete loss of blood flow to her foot where it was turning purple and discoloring rapidly. She ended up having a nurse evaluate this and ended up with a stat angiography to go in and find what was going on they were able to identify the clot clear this out and subsequently restore blood flow. However this was not without already having damage to the distal  portion of her toes. With that being said the good news is she seems to have good blood flow at the moment I was able to recheck and that we were able to get a good pulse with Doppler. With that being said I think that a lot of the injury to her toes is more of a degloving type injury at this point which hopefully will heal the first and second toes are the main ones that are of concern in my opinion the others I think just have blisters which will come off easily and should heal quite readily. The patient tells me that her blood sugars are still elevated but not as bad as what they have been previous. Again she has had multiple instances of having fairly significant DKA based on what I am hearing. Patient does have a history of peripheral vascular disease of the right lower extremity, stage II pressure ulcer to the sacrum that occurred while she was in the hospital, diabetes mellitus type 2, congestive heart failure, and hypertension. Her most recent hemoglobin A1c was 9.8 that was on 04-23-2022 05-13-2022 upon evaluation today patient appears to be doing decently well with regard to her wounds in general. With that being said it is the great toe and the second toe and the most concerned I am not certain that the tip of the great toe survive. Nonetheless I am good I have a look at what we can see today. [place], [name] ([id number]) [id number] Page 6 of 8 Objective Constitutional Well-nourished and  well-hydrated in no acute distress. Vitals Time Taken: 2:20 PM, Height: 66 in, Weight: 118 lbs, BMI: 19, Temperature: 97.6 F, Pulse: 80 bpm, Respiratory Rate: 18 breaths/min, Blood Pressure: 90/61 mmHg. Respiratory normal breathing without difficulty. Psychiatric this patient is able to make decisions and demonstrates good insight into disease process. Alert and Oriented x 3. pleasant and cooperative. General Notes: Upon inspection patient's wound bed actually showed signs of good granulation in a lot of areas I did perform some debridement on the heel nothing else really was able to be debrided that require debridement at this point the great toe I think may not have survived the incident but nonetheless outside of that I think the second toe is probably going to be able to be salvaged I just think that she may have lost too much blood flow during the vascular episode to salvage the great toe. Integumentary (Hair, Skin) Wound #1 status is Open. Original cause of wound was Gradually Appeared. The date acquired was: 04/21/2022. The wound has been in treatment 1 weeks. The wound is located on the Right T Second. The wound measures 1.3cm length x 1.5cm width x 0.1cm depth; 1.532cm^2 area and 0.153cm^3 volume. There is oe no tunneling or undermining noted. There is a medium amount of serosanguineous drainage noted. There is no granulation within the wound bed. There is a large (67-100%) amount of necrotic tissue within the wound bed including Eschar. Wound #2 status is Open. Original cause of wound was Gradually Appeared. The date acquired was: 04/21/2022. The wound has been in treatment 1 weeks. The wound is located on the Right T Great. The wound measures 3cm length x 3cm width x 0.1cm depth; 7.069cm^2 area and 0.707cm^3 volume. There is no oe tunneling or undermining noted. There is a none present amount of drainage noted. There is no granulation within the wound bed. There is a large  (67-100%) amount of necrotic tissue within the wound bed including Eschar. Wound #3 status  is Healed - Epithelialized. Original cause of wound was Gradually Appeared. The date acquired was: 03/19/2022. The wound has been in treatment 1 weeks. The wound is located on the Midline Sacrum. The wound measures 0cm length x 0cm width x 0cm depth; 0cm^2 area and 0cm^3 volume. There is no tunneling or undermining noted. There is a none present amount of drainage noted. There is no granulation within the wound bed. There is no necrotic tissue within the wound bed. Wound #5 status is Open. Original cause of wound was Gradually Appeared. The date acquired was: 04/21/2022. The wound has been in treatment 1 weeks. The wound is located on the Right Calcaneus. The wound measures 4cm length x 5cm width x 0.1cm depth; 15.708cm^2 area and 1.571cm^3 volume. There is Fat Layer (Subcutaneous Tissue) exposed. There is no tunneling or undermining noted. There is a medium amount of serosanguineous drainage noted. There is medium (34-66%) red, pink granulation within the wound bed. There is a medium (34-66%) amount of necrotic tissue within the wound bed including Eschar and Adherent Slough. Wound #6 status is Open. Original cause of wound was Gradually Appeared. The date acquired was: 04/21/2022. The wound has been in treatment 1 weeks. The wound is located on the Right T Third. The wound measures 0.5cm length x 0.8cm width x 0.1cm depth; 0.314cm^2 area and 0.031cm^3 volume. There is no oe tunneling or undermining noted. There is a medium amount of serosanguineous drainage noted. There is no granulation within the wound bed. There is a large (67-100%) amount of necrotic tissue within the wound bed including Eschar and Adherent Slough. Wound #7 status is Healed - Epithelialized. Original cause of wound was Gradually Appeared. The date acquired was: 04/21/2022. The wound has been in treatment 1 weeks. The wound is located on the  Right T Fifth. The wound measures 0cm length x 0cm width x 0cm depth; 0cm^2 area and 0cm^3 volume. oe There is no tunneling or undermining noted. There is a none present amount of drainage noted. There is no granulation within the wound bed. There is no necrotic tissue within the wound bed. 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 There was a Excisional Skin/Subcutaneous Tissue Debridement with a total area of 20 sq cm performed by Allen Derry, PA-C. With the following instrument(s): Curette to remove Viable and Non-Viable tissue/material. Material removed includes Eschar, Subcutaneous Tissue, and Slough. No specimens were taken. A time out was conducted at 14:49, prior to the start of the procedure. A Moderate amount of bleeding was controlled with Pressure. The procedure DINESHA, TWIGGS ([id number]) [id number] Page 7 of 8 was tolerated well. Post Debridement Measurements: 4cm length x 5cm width x 0.1cm depth; 1.571cm^3 volume. Character of Wound/Ulcer Post Debridement is stable. Severity of Tissue Post Debridement is: Fat layer exposed. Post procedure Diagnosis Wound #5: Same as Pre-Procedure Plan Follow-up Appointments: Return Appointment in 1 week. Home Health: Home Health Company: -  [facility] for  wound care. May utilize formulary equivalent dressing for wound treatment orders unless otherwise specified. Home Health Nurse may visit PRN to address patientoos wound care needs. Scheduled days for dressing changes to be completed; exception, patient has scheduled wound care visit that day. **Please direct any NON-WOUND related issues/requests for orders to patient's Primary Care Physician. **If current dressing causes regression in wound condition, may D/C ordered dressing product/s and apply Normal Saline Moist Dressing daily until next Wound Healing Center or Other MD appointment. **Notify Wound Healing Center of regression in wound condition at [phone]. Bathing/ Shower/ Hygiene: Wash wounds with antibacterial soap and water. - dial soap recommended May shower; gently cleanse wound with antibacterial soap, rinse and pat dry prior to dressing wounds No tub bath. Anesthetic (Use 'Patient Medications' Section for Anesthetic Order Entry): Lidocaine applied to wound bed Edema Control - Lymphedema / Segmental Compressive Device / Other: Elevate, Exercise Daily and Avoid Standing for Long Periods of Time. Elevate legs to the level of the heart and pump ankles as often as possible Elevate leg(s) parallel to the floor when sitting. DO YOUR BEST to sleep in the bed at night. DO NOT sleep in your recliner. Long hours of sitting in a recliner leads to swelling of the legs and/or potential wounds on your backside. Off-Loading: Turn and reposition every 2 hours - keep pressure off sacrum WOUND #1: - T Second Wound Laterality: Right oe Cleanser: Soap and Water 1 x Per Day/30 Days Discharge Instructions: Gently cleanse wound with antibacterial soap, rinse and pat dry prior to dressing wounds Topical: Betadine 1 x Per Day/30 Days Discharge Instructions: Apply betadine as directed. Prim Dressing: Gauze 1 x Per Day/30 Days ary Discharge Instructions: As  directed: dry Secondary Dressing: ABD Pad 5x9 (in/in) 1 x Per Day/30 Days Discharge Instructions: Cover with ABD pad Secured With: Medipore T - 64M Medipore H Soft Cloth Surgical T ape ape, 2x2 (in/yd) 1 x Per Day/30 Days Secured With: [employer] Sterile or Non-Sterile 6-ply 4.5x4 (yd/yd) 1 x Per Day/30 Days Discharge Instructions: Apply Kerlix as directed WOUND #2: - T Great Wound Laterality: Right oe Cleanser: Soap and Water 1 x Per Day/30 Days Discharge Instructions: Gently cleanse wound with antibacterial soap, rinse and pat dry prior to dressing wounds Topical: Betadine 1 x Per Day/30 Days Discharge Instructions: Apply betadine as directed. Prim Dressing: Gauze 1 x Per Day/30 Days ary Discharge Instructions: As directed: dry Secondary Dressing: ABD Pad 5x9 (in/in) 1 x Per Day/30 Days Discharge Instructions: Cover with ABD pad Secured With: Medipore T - 64M Medipore H Soft Cloth Surgical T ape ape, 2x2 (in/yd) 1 x Per Day/30 Days Secured With: [employer] Sterile or Non-Sterile 6-ply 4.5x4 (yd/yd) 1 x Per Day/30 Days Discharge Instructions: Apply Kerlix as directed WOUND #5: - Calcaneus Wound Laterality: Right Cleanser: Soap and Water 3 x Per Week/30 Days Discharge Instructions: Gently cleanse wound with antibacterial soap, rinse and pat dry prior to dressing wounds Prim Dressing: Silvercel 4 1/4x 4 1/4 (in/in) 3 x Per Week/30 Days ary Discharge Instructions: Apply Silvercel 4 1/4x 4 1/4 (in/in) as instructed Secondary Dressing: ABD Pad 5x9 (in/in) 3 x Per Week/30 Days Discharge Instructions: Cover with ABD pad Secured With: Medipore T - 64M Medipore H Soft Cloth Surgical T ape ape, 2x2 (in/yd) 3 x Per Week/30 Days Secured With: [employer] or Non-Sterile 6-ply 4.5x4 (yd/yd) 3 x Per Week/30 Days Discharge Instructions: Apply Kerlix as directed WOUND #6: - T Third Wound Laterality: Right oe  Cleanser: Soap and Water 1 x Per Day/30 Days Discharge Instructions: Gently  cleanse wound with antibacterial soap, rinse and pat dry prior to dressing wounds Topical: Betadine 1 x Per Day/30 Days Discharge Instructions: Apply betadine as directed. Prim Dressing: Gauze 1 x Per Day/30 Days ary Discharge Instructions: As directed: dry Secondary Dressing: ABD Pad 5x9 (in/in) 1 x Per Day/30 Days Discharge Instructions: Cover with ABD pad Secured With: Medipore T - 58M Medipore H Soft Cloth Surgical T ape ape, 2x2 (in/yd) 1 x Per Day/30 Days Secured With: Kerlix Roll Sterile or Non-Sterile 6-ply 4.5x4 (yd/yd) 1 x Per Day/30 Days Discharge Instructions: Apply Kerlix as directed 1. I am going to recommend that we have the patient continue to monitor for any signs of infection or worsening. Based on what I am seeing I do believe that she is really doing quite well at this point with regard to the majority of her wounds I am very pleased in that regard. The great toe is the 1 exception that I am not sure we can to be able to heal. 2. I am good recommend we use Betadine to the second toe and first toe of the right foot and the remainder dressings are going to actually be a continuation with the silver cell. [place], [name] ([id number]) [id number] Page 8 of 8 We will see patient back for reevaluation in 1 week here in the clinic. If anything worsens or changes patient will contact our office for additional recommendations. If the patient does need to proceed with amputation I would recommend referral back to Dr. Wyn Quaker at that point. Electronic Signature(s) Signed: 05/14/2022 1:18:37 PM By: Allen Derry PA-C Entered By: Allen Derry on 05/14/2022 13:18:37 -------------------------------------------------------------------------------- SuperBill Details Patient Name: Date of Service: JAYMEE, TILSON [facility] 05/13/2022 Medical Record Number: [id number] Patient Account Number: [id number] Date of Birth/Sex: Treating RN: 1970/08/20 (52 y.o. 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 I10 Essential (primary) hypertension Facility Procedures : CPT4 Code: [id number] Description: 11042 - DEB SUBQ TISSUE 20 SQ CM/< ICD-10 Diagnosis Description L97.512 Non-pressure chronic ulcer of other part of right foot with fat layer exposed Modifier: Quantity: 1 Physician Procedures : CPT4 Code Description Modifier [id number] 11042 - WC PHYS SUBQ TISS 20 SQ CM ICD-10 Diagnosis Description L97.512 Non-pressure chronic ulcer of other part of right foot with fat layer exposed Quantity: 1 Electronic Signature(s) Signed: 05/14/2022 1:19:25 PM By: Allen Derry PA-C Entered By: Allen Derry on 05/14/2022 13:19:25

## 2022-05-14 NOTE — Progress Notes (Signed)
[place], [name] ([id number]) [id number] Page 1 of 13 Visit Report for 05/13/2022 Arrival Information Details Patient Name: Date of Service: Beth Carroll, Beth Carroll [facility] 05/13/2022 2:00 PM Medical Record Number: [id number] Patient Account Number: [id number] Date of Birth/Sex: Treating RN: [birth year] (52 y.o. [name] Primary Care Diaz Crago: Marcelino Duster Other Clinician: Referring Kenichi Cassada: Treating Luciel Brickman/Extender: Joylene Grapes in Treatment: 1 Visit Information History Since Last Visit Added or deleted any medications: No Patient Arrived: Wheel Chair Any new allergies or adverse reactions: No Arrival Time: 14:22 Had a fall or experienced change in No Transfer Assistance: Transfer Board activities of daily living that may affect Patient Identification Verified: Yes risk of falls: Secondary Verification Process Completed: Yes Hospitalized since last visit: No Patient Has Alerts: Yes Has Dressing in Place as Prescribed: Yes Patient Alerts: Patient on Blood Thinner Pain Present Now: Yes type 2 diabetic ABI 02/02/22 L 1.12 ABI 02/02/22 R 1.17 TBI 02/02/22 L 0.93 TBI 02/02/22 R 0.90 Electronic Signature(s) Signed: 05/13/2022 5:10:54 PM By: Angelina Pih Entered By: Angelina Pih on 05/13/2022 14:22:39 -------------------------------------------------------------------------------- Clinic Level of Care Assessment Details Patient Name: Date of Service: Beth Carroll, Beth Carroll 05/13/2022 2:00 PM Medical Record Number: [id number] Patient Account Number: [id number] Date of Birth/Sex: Treating RN: 18-Jun-1970 (52 y.o. [name] Primary Care Keyerra Lamere: Marcelino Duster Other Clinician: Referring Jaimen Melone: Treating Gordan Grell/Extender: Joylene Grapes in Treatment: 1 Clinic Level of Care Assessment Items TOOL 1 Quantity Score  - 0 Use when EandM and Procedure is performed on INITIAL visit ASSESSMENTS - Nursing Assessment /  Reassessment  - 0 General Physical Exam (combine w/ comprehensive assessment (listed just below) when performed on new pt. evals)  - 0 Comprehensive Assessment (HX, ROS, Risk Assessments, Wounds Hx, etc.) ASSESSMENTS - Wound and Skin Assessment / Reassessment  - 0 Dermatologic / Skin Assessment (not related to wound area) ASSESSMENTS - Ostomy and/or Continence Assessment and Care  - 0 Incontinence Assessment and Management  - 0 Ostomy Care Assessment and Management (repouching, etc.) PROCESS - Coordination of Care  - 0 Simple Patient / Family Education for ongoing care  - 0 Complex (extensive) Patient / Family Education for ongoing care  - 0 Staff obtains Chiropractor, Records, T Results / Process Orders est  - 0 Staff telephones HHA, Nursing Homes / Clarify orders / etc  - 0 Routine Transfer to another Facility (non-emergent condition)  - 0 Routine Hospital Admission (non-emergent condition)  - 0 New Admissions / Insurance Authorizations / Ordering NPWT Apligraf, etc. ,  - 0 Emergency Hospital Admission (emergent condition) [place], [name] ([id number]) [id number] Page 2 of 13 PROCESS - Special Needs  - 0 Pediatric / Minor Patient Management  - 0 Isolation Patient Management  - 0 Hearing / Language / Visual special needs  - 0 Assessment of Community assistance (transportation, D/C planning, etc.)  - 0 Additional assistance / Altered mentation  - 0 Support Surface(s) Assessment (bed, cushion, seat, etc.) INTERVENTIONS - Miscellaneous  - 0 External ear exam  - 0 Patient Transfer (multiple staff / Nurse, adult / Similar devices)  - 0 Simple Staple / Suture removal (25 or less)  - 0 Complex Staple / Suture removal (26 or more)  - 0 Hypo/Hyperglycemic Management (do not check if billed separately)  - 0 Ankle / Brachial Index (ABI) - do not check if billed separately Has the patient been seen at  the hospital within the last three years: Yes Total Score: 0 Level Of Care: ____ Electronic Signature(s) Signed: 05/13/2022 5:10:54  PM By: Angelina Pih Entered By: Angelina Pih on 05/13/2022 17:00:40 -------------------------------------------------------------------------------- Encounter Discharge Information Details Patient Name: Date of Service: Beth Carroll, Beth Carroll [facility] 05/13/2022 2:00 PM Medical Record Number: [id number] Patient Account Number: [id number] Date of Birth/Sex: Treating RN: 1970-05-23 (52 y.o. [name] Primary Care Ferrin Liebig: Marcelino Duster Other Clinician: Referring Corrado Hymon: Treating Kenyona Rena/Extender: Joylene Grapes in Treatment: 1 Encounter Discharge Information Items Post Procedure Vitals Discharge Condition: Stable Temperature (F): 97.6 Ambulatory Status: Wheelchair Pulse (bpm): 80 Discharge Destination: Home Respiratory Rate (breaths/min): 18 Transportation: Other Blood Pressure (mmHg): 90/61 Accompanied By: spouse Schedule Follow-up Appointment: Yes Clinical Summary of Care: Electronic Signature(s) Signed: 05/13/2022 5:04:30 PM By: Angelina Pih Previous Signature: 05/13/2022 5:02:16 PM Version By: Angelina Pih Entered By: Angelina Pih on 05/13/2022 17:04:29 -------------------------------------------------------------------------------- Lower Extremity Assessment Details Patient Name: Date of Service: Beth Carroll, Beth Carroll 05/13/2022 2:00 PM Medical Record Number: [id number] Patient Account Number: [id number] Date of Birth/Sex: Treating RN: Sep 15, 1970 (51 y.o. [name] Primary Care Kasson Lamere: Marcelino Duster Other Clinician: Referring Nader Boys: Treating Hermila Millis/Extender: Joylene Grapes in Treatment: 1 Edema Assessment Assessed: [Left: No] [Right: No] Edema: [Left: Ye] [Right: s] W[LeftLouanne Belton ([id number])] [Right: [id number] Page 3 of 13] Calf Left: Right: Point  of Measurement: 30 cm From Medial Instep 35.2 cm Ankle Left: Right: Point of Measurement: 10 cm From Medial Instep 26.1 cm Vascular Assessment Pulses: Dorsalis Pedis Doppler Audible: [Right:Yes] Posterior Tibial Doppler Audible: [Right:Yes] Electronic Signature(s) Signed: 05/13/2022 5:10:54 PM By: Angelina Pih Entered By: Angelina Pih on 05/13/2022 14:44:15 -------------------------------------------------------------------------------- Multi Wound Chart Details Patient Name: Date of Service: Beth Carroll 05/13/2022 2:00 PM Medical Record Number: [id number] Patient Account Number: [id number] Date of Birth/Sex: Treating RN: 02/08/1970 (52 y.o. 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 Primary Etiology: Congestive Heart Failure, Congestive Heart Failure, Congestive Heart Failure, Comorbid History: Hypertension, Peripheral Arterial Hypertension, Peripheral Arterial Hypertension, Peripheral Arterial Disease, Type II Diabetes Disease, Type II Diabetes Disease, Type II Diabetes 04/21/2022 04/21/2022 03/19/2022 Date Acquired: 1 1 1  Weeks of Treatment: Open Open Healed - Epithelialized Wound Status: No No No Wound Recurrence: Yes Yes No Pending A mputation on Presentation: 1.3x1.5x0.1 3x3x0.1 0x0x0 Measurements L x W x D (cm) 1.532 7.069 0 A (cm) : rea 0.153 0.707 0 Volume (cm) : 42.60% -88262.50% 100.00% % Reduction in A rea: 42.70%  -70600.00% 100.00% % Reduction in Volume: Full Thickness Without Exposed Partial Thickness Category/Stage II Classification: Support Structures Medium None Present None Present Exudate Amount: Serosanguineous N/A N/A Exudate Type: red, brown N/A N/A Exudate Color: None Present (0%) None Present (0%) None Present (0%) Granulation Amount: N/A N/A N/A Granulation QualityFreddye Cardamone, [name] ([id number]) [id number] Page 4 of 13 Large (67-100%) Large (67-100%) None Present (0%) Necrotic A mount: Eschar Eschar N/A Necrotic Tissue: None None Large (67-100%) Epithelialization: Wound Number: 5 6 7  Photos: Right Calcaneus Right T Third oe Right T Fifth oe Wound Location: Gradually Appeared Gradually Appeared Gradually Appeared Wounding 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 Etiology: Congestive Heart Failure, Congestive Heart Failure, Congestive Heart Failure, Comorbid History: Hypertension, Peripheral Arterial Hypertension, Peripheral Arterial Hypertension,  Peripheral Arterial Disease, Type II Diabetes Disease, Type II Diabetes Disease, Type II Diabetes 04/21/2022 04/21/2022 04/21/2022 Date Acquired: Weeks of Treatment: Open Open Open Wound Status: No No No Wound Recurrence: No Yes Yes Pending A mputation on Presentation: 4x5x0.1 0.5x0.8x0.1 0.1x0.1x0.1 Measurements L x W x D (cm) 15.708 0.314 0.008 A (cm) : rea 1.571 0.031 0.001 Volume (cm) : 33.30% 88.60% 95.90% % Reduction in A rea: 33.30% 88.70% 95.00% % Reduction in Volume: Full Thickness Without Exposed Full Thickness Without Exposed Full Thickness Without Exposed Classification: Support Structures Support Structures Support Structures Medium Medium Medium Exudate A mount: Serosanguineous Serosanguineous Serosanguineous Exudate Type: red, brown red, brown red, brown Exudate Color: Medium (34-66%) None Present (0%)  None Present (0%) Granulation A mount: Red, Pink N/A N/A Granulation Quality: Medium (34-66%) Large (67-100%) None Present (0%) Necrotic A mount: Eschar, Adherent Slough Eschar, Adherent Slough N/A Necrotic Tissue: Fat Layer (Subcutaneous Tissue): Yes Fat Layer (Subcutaneous Tissue): No Fat Layer (Subcutaneous Tissue): No Exposed Structures: None None None Epithelialization: Treatment Notes Electronic Signature(s) Signed: 05/13/2022 5:10:54 PM By: Angelina Pih Entered By: Angelina Pih on 05/13/2022 14:49:19 -------------------------------------------------------------------------------- Multi-Disciplinary Care Plan Details Patient Name: Date of Service: NARYIAH, SCHLEY [facility] 05/13/2022 2:00 PM Medical Record Number: [id number] Patient Account Number: [id number] Date of Birth/Sex: Treating RN: [birth year] (52 y.o. [name] Primary Care Dellar Traber: Marcelino Duster Other Clinician: Referring Brysen Shankman: Treating Faven Watterson/Extender: Joylene Grapes in Treatment: 1 Active Inactive Necrotic Tissue Nursing Diagnoses: Impaired tissue integrity related to necrotic/devitalized tissue Knowledge deficit related to management of necrotic/devitalized tissue Goals: Necrotic/devitalized tissue will be minimized in the wound bed Date Initiated: 05/06/2022 Target Resolution Date: 06/04/2022 Goal Status: Active Patient/caregiver will verbalize understanding of reason and process for debridement of necrotic tissue Date Initiated: 05/06/2022 Target Resolution Date: 06/04/2022 ILANI, OTTERSON ([id number]) [id number] Page 5 of 13 Goal Status: Active Interventions: Assess patient pain level pre-, during and post procedure and prior to discharge Provide education on necrotic tissue and debridement process Treatment Activities: Apply topical anesthetic as ordered : 05/06/2022 Notes: Wound/Skin Impairment Nursing Diagnoses: Impaired tissue  integrity Knowledge deficit related to ulceration/compromised skin integrity Goals: Ulcer/skin breakdown will have a volume reduction of 30% by week 4 Date Initiated: 05/06/2022 Target Resolution Date: 06/03/2022 Goal Status: Active Ulcer/skin breakdown will have a volume reduction of 50% by week 8 Date Initiated: 05/06/2022 Target Resolution Date: 07/01/2022 Goal Status: Active Ulcer/skin breakdown will have a volume reduction of 80% by week 12 Date Initiated: 05/06/2022 Target Resolution Date: 07/29/2022 Goal Status: Active Ulcer/skin breakdown will heal within 14 weeks Date Initiated: 05/06/2022 Target Resolution Date: 08/12/2022 Goal Status: Active Interventions: Assess patient/caregiver ability to obtain necessary supplies Assess patient/caregiver ability to perform ulcer/skin care regimen upon admission and as needed Assess ulceration(s) every visit Provide education on ulcer and skin care Treatment Activities: Skin care regimen initiated : 05/06/2022 Notes: Electronic Signature(s) Signed: 05/13/2022 5:00:59 PM By: Angelina Pih Entered By: Angelina Pih on 05/13/2022 17:00:58 -------------------------------------------------------------------------------- Pain Assessment Details Patient Name: Date of Service: Beth Carroll, Beth Carroll 05/13/2022 2:00 PM Medical Record Number: [id number] Patient Account Number: [id number] Date of Birth/Sex: Treating RN: [birth year] (52 y.o. [name] Primary Care Verlon Carcione: Marcelino Duster Other Clinician: Referring Woodrow Dulski: Treating Kentley Blyden/Extender: Joylene Grapes in Treatment: 1 Active Problems Location of Pain Severity and Description of Pain Patient Has Paino Yes Site Locations Rate the pain. [place], [name] ([id number]) [id number] Page 6 of 13 Rate the pain. Current Pain Level: 6 Pain Management and Medication  Current Pain Management: Electronic Signature(s) Signed: 05/13/2022 5:10:54 PM By:  Angelina Pih Entered By: Angelina Pih on 05/13/2022 14:23:00 -------------------------------------------------------------------------------- Patient/Caregiver Education Details Patient Name: Date of Service: Beth Carroll 4/18/2024andnbsp2:00 PM Medical Record Number: [id number] Patient Account Number: [id number] Date of Birth/Gender: Treating RN: 12/06/1970 (52 y.o. [name] Primary Care Physician: Marcelino Duster Other Clinician: Referring Physician: Treating Physician/Extender: Joylene Grapes in Treatment: 1 Education Assessment Education Provided To: Patient and Caregiver Education Topics Provided Wound Debridement: Handouts: Wound Debridement Methods: Explain/Verbal Responses: State content correctly Wound/Skin Impairment: Handouts: Caring for Your Ulcer Methods: Explain/Verbal Responses: State content correctly Electronic Signature(s) Signed: 05/13/2022 5:10:54 PM By: Angelina Pih Entered By: Angelina Pih on 05/13/2022 17:01:16 -------------------------------------------------------------------------------- Wound Assessment Details Patient Name: Date of Service: Beth Carroll, Beth Carroll 05/13/2022 2:00 PM Medical Record Number: [id number] Patient Account Number: [id number] Date of Birth/Sex: Treating RN: Dec 17, 1970 (52 y.o. 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 Second oe Wound Open Wounding Event: Gradually Appeared Status: Date Acquired: 04/21/2022 Comorbid Congestive Heart Failure, Hypertension, Peripheral Arterial Weeks Of Treatment: 1 History: Disease, Type II Diabetes Clustered Wound: No Pending Amputation On  Presentation Photos Wound Measurements Length: (cm) 1.3 Width: (cm) 1.5 Depth: (cm) 0.1 Area: (cm) 1.532 Volume: (cm) 0.153 % Reduction in Area: 42.6% % Reduction in Volume: 42.7% Epithelialization: None Tunneling: No Undermining: No Wound Description Classification: Full Thickness Without Exposed Support Structures Exudate Amount: Medium Exudate Type: Serosanguineous Exudate Color: red, brown Foul Odor After Cleansing: No Slough/Fibrino Yes Wound Bed Granulation Amount: None Present (0%) Necrotic Amount: Large (67-100%) Necrotic Quality: Eschar Treatment Notes Wound #1 (Toe Second) Wound Laterality: Right Cleanser Soap and Water Discharge Instruction: Gently cleanse wound with antibacterial soap, rinse and pat dry prior to dressing wounds Peri-Wound Care Topical Betadine Discharge Instruction: Apply betadine as directed. Primary Dressing Gauze Discharge Instruction: As directed: dry Secondary Dressing ABD Pad 5x9 (in/in) Discharge Instruction: Cover with ABD pad Secured With Medipore T - 66M Medipore H Soft Cloth Surgical T ape ape, 2x2 (in/yd) Kerlix Roll Sterile or Non-Sterile 6-ply 4.5x4 (yd/yd) Discharge Instruction: Apply Kerlix as directed Compression Wrap Compression Stockings Add-Ons Electronic Signature(s) [place], [name] ([id number]) [id number] Page 8 of 13 Signed: 05/13/2022 5:10:54 PM By: Angelina Pih Entered By: Angelina Pih on 05/13/2022 14:40:52 -------------------------------------------------------------------------------- Wound Assessment Details Patient Name: Date of Service: Beth Carroll, Beth Carroll 05/13/2022 2:00 PM Medical Record Number: [id number] Patient Account Number: [id number] Date of Birth/Sex: Treating RN: 1970-03-02 (52 y.o. 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 Great oe Wound Open Wounding Event: Gradually Appeared Status: Date Acquired: 04/21/2022 Comorbid Congestive Heart Failure, Hypertension, Peripheral Arterial Weeks Of Treatment: 1 History: Disease, Type II Diabetes Clustered Wound: No Pending Amputation On Presentation Photos Wound Measurements Length: (cm) 3 Width: (cm) 3 Depth: (cm) 0.1 Area: (cm) 7.069 Volume: (cm) 0.707 % Reduction in Area: -88262.5% % Reduction in Volume: -70600% Epithelialization: None Tunneling: No Undermining: No Wound Description Classification: Partial Thickness Exudate Amount: None Present Foul Odor After Cleansing: No Slough/Fibrino No Wound Bed Granulation Amount: None Present (0%) Necrotic Amount: Large (67-100%) Necrotic Quality: Eschar Treatment Notes Wound #2 (Toe Great) Wound  Laterality: Right Cleanser Soap and Water Discharge Instruction: Gently cleanse wound with antibacterial soap, rinse and pat dry prior to dressing wounds Peri-Wound Care Topical Betadine Discharge Instruction: Apply betadine as directed. Primary Dressing Gauze Discharge Instruction: As directed: dry BRAILEE, RIEDE ([id number]) [id number] Page 9 of 13 Secondary Dressing ABD Pad 5x9 (in/in) Discharge Instruction: Cover with ABD pad Secured With Medipore T - 150M Medipore H Soft Cloth Surgical T ape ape, 2x2 (in/yd) Kerlix Roll Sterile or Non-Sterile 6-ply 4.5x4 (yd/yd) Discharge Instruction: Apply Kerlix as directed Compression Wrap Compression Stockings Add-Ons Electronic Signature(s) Signed: 05/13/2022 5:10:54 PM By: Angelina Pih Entered By: Angelina Pih on 05/13/2022 14:41:30 -------------------------------------------------------------------------------- Wound Assessment Details Patient Name: Date of Service: Beth Carroll, Beth Carroll 05/13/2022 2:00 PM Medical Record Number:  [id number] Patient Account Number: [id number] Date of Birth/Sex: Treating RN: [birth year] (52 y.o. [name] Primary Care Chrissa Meetze: Marcelino Duster Other Clinician: Referring Jerren Flinchbaugh: Treating Stori Royse/Extender: Joylene Grapes in Treatment: 1 Wound Status Wound Number: 3 Primary Pressure Ulcer Etiology: Wound Location: Midline Sacrum Wound Healed - Epithelialized Wounding Event: Gradually Appeared Status: Date Acquired: 03/19/2022 Comorbid Congestive Heart Failure, Hypertension, Peripheral Arterial Weeks Of Treatment: 1 History: Disease, Type II Diabetes Clustered Wound: No Photos Wound Measurements Length: (cm) Width: (cm) Depth: (cm) Area: (cm) Volume: (cm) 0 % Reduction in Area: 100% 0 % Reduction in Volume: 100% 0 Epithelialization: Large (67-100%) 0 Tunneling: No 0 Undermining: No Wound Description Classification: Category/Stage II Exudate Amount: None Present Foul Odor After Cleansing: No Slough/Fibrino No Wound Bed Granulation Amount: None Present (0%) Necrotic Amount: None Present (0%) Electronic Signature(s) Signed: 05/13/2022 5:10:54 PM By: Angelina Pih Entered By: Angelina Pih on 05/13/2022 14:47:46 [name] ([id number]) [id number] Page 10 of 13 -------------------------------------------------------------------------------- Wound Assessment Details Patient Name: Date of Service: Beth Carroll, Beth Carroll [facility] 05/13/2022 2:00 PM Medical Record Number: [id number] Patient Account Number: [id number] Date of Birth/Sex: Treating RN: [birth year] (52 y.o. 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 Wound Open Wounding Event: Gradually Appeared Status: Date Acquired:  04/21/2022 Comorbid Congestive Heart Failure, Hypertension, Peripheral Arterial Weeks Of Treatment: 1 History: Disease, Type II Diabetes Clustered Wound: No Photos Wound Measurements Length: (cm) 4 Width: (cm) 5 Depth: (cm) 0.1 Area: (cm) 15.708 Volume: (cm) 1.571 % Reduction in Area: 33.3% % Reduction in Volume: 33.3% Epithelialization: None Tunneling: No Undermining: No Wound Description Classification: Full Thickness Without Exposed Support Structures Exudate Amount: Medium Exudate Type: Serosanguineous Exudate Color: red, brown Foul Odor After Cleansing: No Slough/Fibrino Yes Wound Bed Granulation Amount: Medium (34-66%) Exposed Structure Granulation Quality: Red, Pink Fat Layer (Subcutaneous Tissue) Exposed: Yes Necrotic Amount: Medium (34-66%) Necrotic Quality: Eschar, Adherent Slough Treatment Notes Wound #5 (Calcaneus) Wound Laterality: Right Cleanser Soap and Water Discharge Instruction: Gently cleanse wound with antibacterial soap, rinse and pat dry prior to dressing wounds Peri-Wound Care Topical Primary Dressing Silvercel 4 1/4x 4 1/4 (in/in) Discharge Instruction: Apply Silvercel 4 1/4x 4 1/4 (in/in) as instructed Secondary Dressing ABD Pad 5x9 (in/in) Discharge Instruction: Cover with ABD pad Secured With [place], [name] ([id number]) [id number] Page 11 of 13 Medipore T - 150M Medipore H Soft Cloth Surgical T ape ape, 2x2 (in/yd) Kerlix Roll Sterile or Non-Sterile 6-ply 4.5x4 (yd/yd) Discharge Instruction: Apply Kerlix as directed Compression Wrap Compression Stockings Add-Ons Electronic Signature(s) Signed: 05/13/2022 5:10:54 PM By: Angelina Pih Entered By: Angelina Pih on 05/13/2022  14:42:38 -------------------------------------------------------------------------------- Wound Assessment Details Patient Name: Date of Service: Beth Carroll, ZIEMBA [facility] 05/13/2022 2:00 PM Medical Record Number: [id number] Patient Account  Number: [id number] Date of Birth/Sex: Treating RN: 1970-11-13 (52 y.o. 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 Third oe Wound Open Wounding Event: Gradually Appeared Status: Date Acquired: 04/21/2022 Comorbid Congestive Heart Failure, Hypertension, Peripheral Arterial Weeks Of Treatment: 1 History: Disease, Type II Diabetes Clustered Wound: No Pending Amputation On Presentation Photos Wound Measurements Length: (cm) 0.5 Width: (cm) 0.8 Depth: (cm) 0.1 Area: (cm) 0.314 Volume: (cm) 0.031 % Reduction in Area: 88.6% % Reduction in Volume: 88.7% Epithelialization: None Tunneling: No Undermining: No Wound Description Classification: Full Thickness Without Exposed Support Structures Exudate Amount: Medium Exudate Type: Serosanguineous Exudate Color: red, brown Foul Odor After Cleansing: No Slough/Fibrino Yes Wound Bed Granulation Amount: None Present (0%) Exposed Structure Necrotic Amount: Large (67-100%) Fat Layer (Subcutaneous Tissue) Exposed: No Necrotic Quality: Eschar, Adherent Slough Treatment Notes Wound #6 (Toe Third) Wound Laterality: Right Cleanser Soap and Water Discharge Instruction: Gently cleanse wound with antibacterial soap, rinse and pat dry prior to dressing wounds [place], [name] ([id number]) [id number] Page 12 of 13 Peri-Wound Care Topical Betadine Discharge Instruction: Apply betadine as directed. Primary Dressing Gauze Discharge Instruction: As directed: dry Secondary Dressing ABD Pad 5x9 (in/in) Discharge Instruction: Cover with ABD pad Secured With Medipore T - 110M Medipore H Soft Cloth Surgical T ape ape, 2x2 (in/yd) Kerlix Roll Sterile or Non-Sterile 6-ply 4.5x4 (yd/yd) Discharge  Instruction: Apply Kerlix as directed Compression Wrap Compression Stockings Add-Ons Electronic Signature(s) Signed: 05/13/2022 5:10:54 PM By: Angelina Pih Entered By: Angelina Pih on 05/13/2022 14:43:23 -------------------------------------------------------------------------------- Wound Assessment Details Patient Name: Date of Service: Beth Carroll, Beth Carroll 05/13/2022 2:00 PM Medical Record Number: [id number] Patient Account Number: [id number] Date of Birth/Sex: Treating RN: 1970-09-17 (52 y.o. 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 T Fifth oe Wound Healed - Epithelialized Wounding Event: Gradually Appeared Status: Date Acquired: 04/21/2022 Comorbid Congestive Heart Failure, Hypertension, Peripheral Arterial Weeks Of Treatment: 1 History: Disease, Type II Diabetes Clustered Wound: No Pending Amputation On Presentation Photos Wound Measurements Length: (cm) Width: (cm) Depth: (cm) Area: (cm) Volume: (cm) 0 % Reduction in Area: 100% 0 % Reduction in Volume: 100% 0 Epithelialization: Large (67-100%) 0 Tunneling: No 0 Undermining: No Wound Description Classification: Full Thickness Without Exposed Support Structures [place], [name] ([id number]) Exudate Amount: None Present Foul Odor After Cleansing: No [id number] Page 13 of 13 Slough/Fibrino No Wound Bed Granulation Amount: None Present (0%) Exposed Structure Necrotic Amount: None Present (0%) Fat Layer (Subcutaneous Tissue) Exposed: No Electronic Signature(s) Signed: 05/13/2022 5:10:54 PM By: Angelina Pih Entered By: Angelina Pih on 05/13/2022 14:54:59 -------------------------------------------------------------------------------- Vitals Details Patient  Name: Date of Service: Beth Carroll 05/13/2022 2:00 PM Medical Record Number: [id number] Patient Account Number: [id number] Date of Birth/Sex: Treating RN: [birth year] (52 y.o. [name] Primary Care Ova Gillentine: Marcelino Duster Other Clinician: Referring Jone Panebianco: Treating Ethelyn Cerniglia/Extender: Joylene Grapes in Treatment: 1 Vital Signs Time Taken: 14:20 Temperature (F): 97.6 Height (in): 66 Pulse (bpm): 80 Weight (lbs): 118 Respiratory Rate (breaths/min): 18 Body Mass Index (BMI): 19 Blood Pressure (mmHg): 90/61 Reference Range: 80 - 120 mg /  dl Electronic Signature(s) Signed: 05/13/2022 5:10:54 PM By: Angelina Pih Entered By: Angelina Pih on 05/13/2022 14:22:53

## 2022-05-19 ENCOUNTER — Other Ambulatory Visit (INDEPENDENT_AMBULATORY_CARE_PROVIDER_SITE_OTHER): Payer: Self-pay

## 2022-05-19 MED ORDER — APIXABAN 5 MG PO TABS: 5.0000 mg | ORAL_TABLET | Freq: Two times a day (BID) | ORAL | 3 refills | Status: DC

## 2022-05-21 ENCOUNTER — Encounter: Payer: Medicaid Other | Admitting: Physician Assistant

## 2022-05-21 DIAGNOSIS — E11621 Type 2 diabetes mellitus with foot ulcer: Secondary | ICD-10-CM | POA: Diagnosis not present

## 2022-05-22 NOTE — Progress Notes (Addendum)
Beth Carroll ([id number]) [id number] Page 1 of 12 Visit Report for 05/21/2022 Arrival Information Details Patient Name: Date of Service: Beth Carroll, Beth Carroll [facility] 05/21/2022 11:15 A M Medical Record Number: [id number] Patient Account Number: [id number] Date of Birth/Sex: Treating RN: 1970-05-26 (52 y.o. Skip Mayer Primary Care Maddi Collar: Marcelino Duster Other Clinician: Referring Tuwanda Vokes: Treating Dimitriy Carreras/Extender: Joylene Grapes in Treatment: 2 Visit Information History Since Last Visit Added or deleted any medications: No Patient Arrived: Wheel Chair Pain Present Now: Yes Arrival Time: 11:15 Transfer Assistance: Transfer Board Patient Identification Verified: Yes Secondary Verification Process Completed: Yes Patient Has Alerts: Yes Patient Alerts: Patient on Blood Thinner type 2 diabetic ABI 02/02/22 L 1.12 ABI 02/02/22 R 1.17 TBI 02/02/22 L 0.93 TBI 02/02/22 R 0.90 Electronic Signature(s) Signed: 05/21/2022 1:21:35 PM By: Elliot Gurney, BSN, RN, CWS, Kim RN, BSN Entered By: Elliot Gurney, BSN, RN, CWS, Kim on 05/21/2022 11:20:18 -------------------------------------------------------------------------------- Clinic Level of Care Assessment Details Patient Name: Date of Service: Beth Carroll [facility] 05/21/2022 11:15 A M Medical Record Number: [id number] Patient Account Number: [id number] Date of Birth/Sex: Treating RN: October 30, 1970 (52 y.o. Cathlean Cower, Kim Primary Care Kaili Castille: Marcelino Duster Other Clinician: Referring Keion Neels: Treating Drena Ham/Extender: Joylene Grapes in Treatment: 2 Clinic Level of Care Assessment Items TOOL 4 Quantity Score []  - 0 Use when only an EandM is performed on FOLLOW-UP visit ASSESSMENTS - Nursing Assessment / Reassessment X- 1 10 Reassessment of Co-morbidities (includes updates in patient status) X- 1 5 Reassessment of Adherence to Treatment Plan ASSESSMENTS - Wound and Skin A ssessment / Reassessment []  -  0 Simple Wound Assessment / Reassessment - one wound X- 4 5 Complex Wound Assessment / Reassessment - multiple wounds []  - 0 Dermatologic / Skin Assessment (not related to wound area) ASSESSMENTS - Focused Assessment []  - 0 Circumferential Edema Measurements - multi extremities []  - 0 Nutritional Assessment / Counseling / Intervention []  - 0 Lower Extremity Assessment (monofilament, tuning fork, pulses) []  - 0 Peripheral Arterial Disease Assessment (using hand held doppler) ASSESSMENTS - Ostomy and/or Continence Assessment and Care []  - 0 Incontinence Assessment and Management []  - 0 Ostomy Care Assessment and Management (repouching, etc.) PROCESS - Coordination of Care X - Simple Patient / Family Education for ongoing care 1 15 [place], [place] ([id number]) [id number] Page 2 of 12 []  - 0 Complex (extensive) Patient / Family Education for ongoing care X- 1 10 Staff obtains Chiropractor, Records, T Results / Process Orders est []  - 0 Staff telephones HHA, Nursing Homes / Clarify orders / etc []  - 0 Routine Transfer to another Facility (non-emergent condition) []  - 0 Routine Hospital Admission (non-emergent condition) []  - 0 New Admissions / Manufacturing engineer / Ordering NPWT Apligraf, etc. , []  - 0 Emergency Hospital Admission (emergent condition) X- 1 10 Simple Discharge Coordination []  - 0 Complex (extensive) Discharge Coordination PROCESS - Special Needs []  - 0 Pediatric / Minor Patient Management []  - 0 Isolation Patient Management []  - 0 Hearing / Language / Visual special needs []  - 0 Assessment of Community assistance (transportation, D/C planning, etc.) []  - 0 Additional assistance / Altered mentation []  - 0 Support Surface(s) Assessment (bed, cushion, seat, etc.) INTERVENTIONS - Wound Cleansing / Measurement []  - 0 Simple Wound Cleansing - one wound X- 4 5 Complex Wound Cleansing - multiple wounds X- 1 5 Wound Imaging  (photographs - any number of wounds) []  - 0 Wound Tracing (instead of photographs) []  - 0 Simple Wound Measurement - one wound X- 4  5 Complex Wound Measurement - multiple wounds INTERVENTIONS - Wound Dressings []  - 0 Small Wound Dressing one or multiple wounds []  - 0 Medium Wound Dressing one or multiple wounds X- 1 20 Large Wound Dressing one or multiple wounds []  - 0 Application of Medications - topical []  - 0 Application of Medications - injection INTERVENTIONS - Miscellaneous []  - 0 External ear exam []  - 0 Specimen Collection (cultures, biopsies, blood, body fluids, etc.) []  - 0 Specimen(s) / Culture(s) sent or taken to Lab for analysis []  - 0 Patient Transfer (multiple staff / Nurse, adult / Similar devices) []  - 0 Simple Staple / Suture removal (25 or less) []  - 0 Complex Staple / Suture removal (26 or more) []  - 0 Hypo / Hyperglycemic Management (close monitor of Blood Glucose) []  - 0 Ankle / Brachial Index (ABI) - do not check if billed separately X- 1 5 Vital Signs Has the patient been seen at the hospital within the last three years: Yes Total Score: 140 Level Of Care: New/Established - Level 4 Electronic Signature(s) Signed: 05/21/2022 1:21:35 PM By: Elliot Gurney, BSN, RN, CWS, Kim RN, BSN Entered By: Elliot Gurney, BSN, RN, CWS, Kim on 05/21/2022 12:09:02 [name] ([id number]) [id number] Page 3 of 12 -------------------------------------------------------------------------------- Encounter Discharge Information Details Patient Name: Date of Service: Beth Carroll 05/21/2022 11:15 A M Medical Record Number: [id number] Patient Account Number: [id number] Date of Birth/Sex: Treating RN: 09/18/1970 (52 y.o. Skip Mayer Primary Care Runell Kovich: Marcelino Duster Other Clinician: Referring Cecile Gillispie: Treating Dagmawi Venable/Extender: Joylene Grapes in Treatment: 2 Encounter Discharge Information Items Discharge Condition:  Stable Ambulatory Status: Ambulatory Discharge Destination: Home Transportation: Private Auto Schedule Follow-up Appointment: Yes Clinical Summary of Care: Electronic Signature(s) Signed: 05/21/2022 1:21:35 PM By: Elliot Gurney, BSN, RN, CWS, Kim RN, BSN Entered By: Elliot Gurney, BSN, RN, CWS, Kim on 05/21/2022 12:11:42 -------------------------------------------------------------------------------- Lower Extremity Assessment Details Patient Name: Date of Service: MATTISON, GOLAY [facility] 05/21/2022 11:15 A M Medical Record Number: [id number] Patient Account Number: [id number] Date of Birth/Sex: Treating RN: 09/17/1970 (52 y.o. Skip Mayer Primary Care Temisha Murley: Marcelino Duster Other Clinician: Referring Kaysa Roulhac: Treating Wren Gallaga/Extender: Joylene Grapes in Treatment: 2 Edema Assessment Assessed: [Left: No] [Right: No] [Left: Edema] [Right: :] Calf Left: Right: Point of Measurement: 30 cm From Medial Instep 33 cm Ankle Left: Right: Point of Measurement: 10 cm From Medial Instep 28 cm Vascular Assessment Pulses: Dorsalis Pedis Palpable: [Right:Yes] Electronic Signature(s) Signed: 05/21/2022 1:21:35 PM By: Elliot Gurney, BSN, RN, CWS, Kim RN, BSN Entered By: Elliot Gurney, BSN, RN, CWS, Kim on 05/21/2022 11:31:00 -------------------------------------------------------------------------------- Multi Wound Chart Details Patient Name: Date of Service: REMEDY, CORPORAN [facility] 05/21/2022 11:15 A M Medical Record Number: [id number] Patient Account Number: [id number] Date of Birth/Sex: Treating RN: 05/02/1970 (52 y.o. Skip Mayer Primary Care Brentt Fread: Marcelino Duster Other Clinician: Referring Laruth Hanger: Treating Ivey Nembhard/Extender: Joylene Grapes in Treatment: 2 Vital Signs [place], Olegario Carroll ([id number]) [id number] Page 4 of 12 Height(in): 66 Pulse(bpm): 87 Weight(lbs): 118 Blood Pressure(mmHg): 91/60 Body Mass Index(BMI): 19 Temperature(F): 97.9 Respiratory  Rate(breaths/min): 16 [1:Photos: No Photos Right T Second oe Wound Location: Gradually Appeared Wounding Event: Arterial Insufficiency Ulcer Primary Etiology: Congestive Heart Failure, Comorbid History: Hypertension, Peripheral Arterial Disease, Type II Diabetes 04/21/2022 Date Acquired:  2 Weeks of Treatment: Open Wound Status: No Wound Recurrence: Yes Pending A mputation on Presentation: 2.5x3x0.1 Measurements L x W x D (cm) 5.89 A (cm) : rea 0.589 Volume (cm) : -120.60% % Reduction in A rea: -120.60% % Reduction  in Volume:  Unclassifiable Classification: Medium Exudate A mount: Serosanguineous Exudate Type: red, brown Exudate Color: None Present (0%) Granulation A mount: N/A Granulation Quality: Large (67-100%) Necrotic A mount: Eschar Necrotic Tissue: None  Epithelialization:] [2:No Photos Right T Great oe Gradually Appeared Arterial Insufficiency Ulcer Congestive Heart Failure, Hypertension, Peripheral Arterial Disease, Type II Diabetes 04/21/2022 2 Open No Yes 9.4x3x0.1 22.148 2.215 -276750.00% -221400.00%  Unclassifiable None Present N/A N/A None Present (0%) N/A Large (67-100%) Eschar None] [5:No Photos Right Calcaneus Gradually Appeared Arterial Insufficiency Ulcer Congestive Heart Failure, Hypertension, Peripheral Arterial Disease, Type II Diabetes  04/21/2022 2 Open No No 3x4x0.1 9.425 0.942 60.00% 60.00% Full Thickness Without Exposed Support Structures Medium Serosanguineous red, brown Medium (34-66%) Red, Pink Medium (34-66%) Eschar, Adherent Slough None] Wound Number: 6 N/A N/A Photos: No Photos N/A N/A Right T Third oe N/A N/A Wound Location: Gradually Appeared N/A N/A Wounding Event: Arterial Insufficiency Ulcer N/A N/A Primary Etiology: Congestive Heart Failure, N/A N/A Comorbid History: Hypertension, Peripheral Arterial Disease, Type II Diabetes 04/21/2022 N/A N/A Date Acquired: 2 N/A N/A Weeks of Treatment: Open N/A N/A Wound Status: No N/A N/A Wound Recurrence: Yes N/A  N/A Pending A mputation on Presentation: 0.4x0.5x0.1 N/A N/A Measurements L x W x D (cm) 0.157 N/A N/A A (cm) : rea 0.016 N/A N/A Volume (cm) : 94.30% N/A N/A % Reduction in A rea: 94.20% N/A N/A % Reduction in Volume: Unclassifiable N/A N/A Classification: Medium N/A N/A Exudate A mount: Serosanguineous N/A N/A Exudate Type: red, brown N/A N/A Exudate Color: None Present (0%) N/A N/A Granulation A mount: N/A N/A N/A Granulation Quality: Large (67-100%) N/A N/A Necrotic A mount: Eschar, Adherent Slough N/A N/A Necrotic Tissue: Fat Layer (Subcutaneous Tissue): No N/A N/A Exposed Structures: None N/A N/A Epithelialization: Treatment Notes Electronic Signature(s) Signed: 05/21/2022 1:21:35 PM By: Elliot Gurney, BSN, RN, CWS, Kim RN, BSN Entered By: Elliot Gurney, BSN, RN, CWS, Kim on 05/21/2022 11:53:17 -------------------------------------------------------------------------------- Multi-Disciplinary Care Plan Details Patient Name: Date of Service: SAHER, DAVEE [facility] 05/21/2022 11:15 [address] [place], Olegario Carroll ([id number]) [phone].pdf Page 5 of 12 Medical Record Number: [id number] Patient Account Number: [id number] Date of Birth/Sex: Treating RN: 09/25/1970 (52 y.o. Cathlean Cower, Kim Primary Care Imaad Reuss: Marcelino Duster Other Clinician: Referring Rhenda [place]: Treating Miyoshi Ligas/Extender: Joylene Grapes in Treatment: 2 Active Inactive Necrotic Tissue Nursing Diagnoses: Impaired tissue integrity related to necrotic/devitalized tissue Knowledge deficit related to management of necrotic/devitalized tissue Goals: Necrotic/devitalized tissue will be minimized in the wound bed Date Initiated: 05/06/2022 Target Resolution Date: 06/04/2022 Goal Status: Active Patient/caregiver will verbalize understanding of reason and process for debridement of necrotic tissue Date Initiated: 05/06/2022 Target Resolution Date: 06/04/2022 Goal Status:  Active Interventions: Assess patient pain level pre-, during and post procedure and prior to discharge Provide education on necrotic tissue and debridement process Treatment Activities: Apply topical anesthetic as ordered : 05/06/2022 Notes: Wound/Skin Impairment Nursing Diagnoses: Impaired tissue integrity Knowledge deficit related to ulceration/compromised skin integrity Goals: Ulcer/skin breakdown will have a volume reduction of 30% by week 4 Date Initiated: 05/06/2022 Target Resolution Date: 06/03/2022 Goal Status: Active Ulcer/skin breakdown will have a volume reduction of 50% by week 8 Date Initiated: 05/06/2022 Target Resolution Date: 07/01/2022 Goal Status: Active Ulcer/skin breakdown will have a volume reduction of 80% by week 12 Date Initiated: 05/06/2022 Target Resolution Date: 07/29/2022 Goal Status: Active Ulcer/skin breakdown will heal within 14 weeks Date Initiated: 05/06/2022 Target Resolution Date: 08/12/2022 Goal Status: Active Interventions: Assess patient/caregiver ability to obtain necessary supplies Assess patient/caregiver ability  to perform ulcer/skin care regimen upon admission and as needed Assess ulceration(s) every visit Provide education on ulcer and skin care Treatment Activities: Skin care regimen initiated : 05/06/2022 Notes: Electronic Signature(s) Signed: 05/21/2022 1:21:35 PM By: Elliot Gurney, BSN, RN, CWS, Kim RN, BSN Entered By: Elliot Gurney, BSN, RN, CWS, Kim on 05/21/2022 12:09:32 -------------------------------------------------------------------------------- Pain Assessment Details Patient Name: Date of Service: ABIGIAL, [place] [facility] 05/21/2022 11:15 A M Medical Record Number: [id number] Patient Account Number: [id number] Date of Birth/Sex: Treating RN: [birth year] (52 y.o. Skip Mayer [place], [place] ([id number]) [id number].pdf Page 6 of 12 Primary Care Carsen Leaf: Marcelino Duster Other Clinician: Referring Ellinore Merced: Treating Ajani Rineer/Extender:  Joylene Grapes in Treatment: 2 Active Problems Location of Pain Severity and Description of Pain Patient Has Paino Yes Site Locations Pain Location: Pain in Ulcers Rate the pain. Current Pain Level: 7 Character of Pain Describe the Pain: Aching, Sharp Pain Management and Medication Current Pain Management: Electronic Signature(s) Signed: 05/21/2022 1:21:35 PM By: Elliot Gurney, BSN, RN, CWS, Kim RN, BSN Entered By: Elliot Gurney, BSN, RN, CWS, Kim on 05/21/2022 11:21:29 -------------------------------------------------------------------------------- Patient/Caregiver Education Details Patient Name: Date of Service: Derl Barrow 4/26/2024andnbsp11:15 A M Medical Record Number: [id number] Patient Account Number: [id number] Date of Birth/Gender: Treating RN: [birth year] (52 y.o. Skip Mayer Primary Care Physician: Marcelino Duster Other Clinician: Referring Physician: Treating Physician/Extender: Joylene Grapes in Treatment: 2 Education Assessment Education Provided To: Patient Education Topics Provided Tissue Oxygenation: Handouts: Peripheral Arterial Disease and Related Ulcers Methods: Explain/Verbal Responses: State content correctly Wound/Skin Impairment: Handouts: Caring for Your Ulcer Methods: Explain/Verbal Responses: State content correctly Electronic Signature(s) Signed: 05/21/2022 1:21:35 PM By: Elliot Gurney, BSN, RN, CWS, Kim RN, BSN Entered By: Elliot Gurney, BSN, RN, CWS, Kim on 05/21/2022 12:10:06 [name] ([id number]) [id number] Page 7 of 12 -------------------------------------------------------------------------------- Wound Assessment Details Patient Name: Date of Service: DAKODA, BASSETTE [facility] 05/21/2022 11:15 A M Medical Record Number: [id number] Patient Account Number: [id number] Date of Birth/Sex: Treating RN: 05-Mar-1970 (52 y.o. 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 Second oe Wound Open Wounding Event: Gradually Appeared Status: Date Acquired: 04/21/2022 Comorbid Congestive Heart Failure, Hypertension, Peripheral Arterial Weeks Of Treatment: 2 History: Disease, Type II Diabetes Clustered Wound: No Pending Amputation On Presentation Photos Wound Measurements Length: (cm) 2.5 Width: (cm) 3 Depth: (cm) 0.1 Area: (cm) 5.89 Volume: (cm) 0.589 % Reduction in Area: -120.6% % Reduction in Volume: -120.6% Epithelialization: None Wound Description Classification: Unclassifiable Exudate Amount: Medium Exudate Type: Serosanguineous Exudate Color: red, brown Foul Odor After Cleansing: No Slough/Fibrino No Wound Bed Granulation Amount: None Present (0%) Necrotic Amount: Large (67-100%) Necrotic Quality: Eschar Treatment Notes Wound #1 (Toe Second) Wound Laterality: Right Cleanser Soap and Water Discharge Instruction: Gently cleanse wound with antibacterial soap, rinse and pat dry prior to dressing wounds Peri-Wound Care Topical Betadine Discharge Instruction: Apply betadine as directed. Primary Dressing Gauze Discharge Instruction: As directed: dry Secondary Dressing ABD Pad 5x9 (in/in) Discharge Instruction: Cover with ABD pad [place], Olegario Carroll ([id number]) [id number] Page 8 of 12 Secured With Medipore T - 61M Medipore H Soft Cloth Surgical T ape ape, 2x2 (in/yd) Kerlix Roll Sterile or Non-Sterile 6-ply 4.5x4 (yd/yd) Discharge Instruction: Apply Kerlix as directed Compression Wrap Compression Stockings Add-Ons Electronic Signature(s) Signed: 05/24/2022 4:21:36 PM By: Angelina Pih Signed: 05/26/2022 11:09:32 AM By: Elliot Gurney, BSN, RN, CWS, Kim RN, BSN Previous Signature:  05/21/2022 1:21:35 PM Version By: Elliot Gurney, BSN, RN, CWS,  Kim RN, BSN Entered By: Angelina Pih on 05/24/2022 16:14:13 -------------------------------------------------------------------------------- Wound Assessment Details Patient Name: Date of Service: VALYNCIA, WIENS [facility] 05/21/2022 11:15 A M Medical Record Number: [id number] Patient Account Number: [id number] Date of Birth/Sex: Treating RN: 04-13-1970 (52 y.o. 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 Great oe Wound Open Wounding Event: Gradually Appeared Status: Date Acquired: 04/21/2022 Comorbid Congestive Heart Failure, Hypertension, Peripheral Arterial Weeks Of Treatment: 2 History: Disease, Type II Diabetes Clustered Wound: No Pending Amputation On Presentation Photos Wound Measurements Length: (cm) 9.4 Width: (cm) 3 Depth: (cm) 0.1 Area: (cm) 22.148 Volume: (cm) 2.215 % Reduction in Area: -276750% % Reduction in Volume: -221400% Epithelialization: None Tunneling: No Undermining: No Wound Description Classification: Unclassifiable Exudate Amount: None Present Foul Odor After Cleansing: No Slough/Fibrino No Wound Bed Granulation Amount: None Present (0%) Necrotic Amount: Large (67-100%) Necrotic Quality: Eschar Treatment Notes Wound #2 (Toe Great) Wound Laterality: Right [place], Olegario Carroll ([id number]) [id number] Page 9 of 12 Cleanser Soap and Water Discharge Instruction: Gently cleanse wound with antibacterial soap, rinse and pat dry prior to dressing wounds Peri-Wound Care Topical Betadine Discharge Instruction: Apply betadine as directed. Primary Dressing Gauze Discharge Instruction: As directed: dry Secondary Dressing ABD Pad 5x9 (in/in) Discharge Instruction: Cover with ABD pad Secured With Medipore T - 22M  Medipore H Soft Cloth Surgical T ape ape, 2x2 (in/yd) Kerlix Roll Sterile or Non-Sterile 6-ply 4.5x4 (yd/yd) Discharge Instruction: Apply Kerlix as directed Compression Wrap Compression Stockings Add-Ons Electronic Signature(s) Signed: 05/24/2022 4:21:36 PM By: Angelina Pih Signed: 05/26/2022 11:09:32 AM By: Elliot Gurney, BSN, RN, CWS, Kim RN, BSN Previous Signature: 05/21/2022 1:21:35 PM Version By: Elliot Gurney, BSN, RN, CWS, Kim RN, BSN Entered By: Angelina Pih on 05/24/2022 16:14:41 -------------------------------------------------------------------------------- Wound Assessment Details Patient Name: Date of Service: JUNITA, KUBOTA [facility] 05/21/2022 11:15 A M Medical Record Number: [id number] Patient Account Number: [id number] Date of Birth/Sex: Treating RN: Nov 29, 1970 (52 y.o. 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 Wound Open Wounding Event: Gradually Appeared Status: Date Acquired: 04/21/2022 Comorbid Congestive Heart Failure, Hypertension, Peripheral Arterial Weeks Of Treatment: 2 History: Disease, Type II Diabetes Clustered Wound: No Photos Wound Measurements Length: (cm) 3 Width: (cm) 4 Depth: (cm) 0.1 Area: (cm) 9.425 Volume: (cm) 0.942 Hedlund, Keirsten ([id number]) % Reduction in Area: 60% % Reduction in Volume: 60% Epithelialization: None [id number] Page 10 of 12 Wound Description Classification: Full Thickness Without Exposed Support Structures Exudate Amount: Medium Exudate Type: Serosanguineous Exudate Color: red, brown Foul Odor After Cleansing: No Slough/Fibrino Yes Wound Bed Granulation Amount: Medium (34-66%) Exposed Structure Granulation Quality: Red, Pink Fat Layer (Subcutaneous Tissue) Exposed:  Yes Necrotic Amount: Medium (34-66%) Necrotic Quality: Eschar, Adherent Slough Treatment Notes Wound #5 (Calcaneus) Wound Laterality: Right Cleanser Soap and Water Discharge Instruction: Gently cleanse wound with antibacterial soap, rinse and pat dry prior to dressing wounds Peri-Wound Care Topical Primary Dressing Silvercel 4 1/4x 4 1/4 (in/in) Discharge Instruction: Apply Silvercel 4 1/4x 4 1/4 (in/in) as instructed Secondary Dressing ABD Pad 5x9 (in/in) Discharge Instruction: Cover with ABD pad Secured With Medipore T - 22M Medipore H Soft Cloth Surgical T ape ape, 2x2 (in/yd) Kerlix Roll  Sterile or Non-Sterile 6-ply 4.5x4 (yd/yd) Discharge Instruction: Apply Kerlix as directed Compression Wrap Compression Stockings Add-Ons Electronic Signature(s) Signed: 05/24/2022 4:21:36 PM By: Angelina Pih Signed: 05/26/2022 11:09:32 AM By: Elliot Gurney, BSN, RN, CWS, Kim RN, BSN Previous Signature: 05/21/2022 1:21:35 PM Version By: Elliot Gurney, BSN, RN, CWS, Kim RN, BSN Entered By: Angelina Pih on 05/24/2022 16:15:07 -------------------------------------------------------------------------------- Wound Assessment Details Patient Name: Date of Service: KRYSTIANA, FORNES [facility] 05/21/2022 11:15 A M Medical Record Number: [id number] Patient Account Number: [id number] Date of Birth/Sex: Treating RN: 05-04-1970 (52 y.o. 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 T Third oe Wound Open Wounding Event: Gradually Appeared Status: Date Acquired: 04/21/2022 Comorbid Congestive Heart Failure, Hypertension, Peripheral Arterial Weeks Of Treatment: 2 History: Disease, Type II Diabetes Clustered Wound: No Pending Amputation On Presentation Photos [place], Olegario Carroll ([id number])  [id number] Page 11 of 12 Wound Measurements Length: (cm) 0.4 Width: (cm) 0.5 Depth: (cm) 0.1 Area: (cm) 0.157 Volume: (cm) 0.016 % Reduction in Area: 94.3% % Reduction in Volume: 94.2% Epithelialization: None Wound Description Classification: Unclassifiable Exudate Amount: Medium Exudate Type: Serosanguineous Exudate Color: red, brown Foul Odor After Cleansing: No Slough/Fibrino Yes Wound Bed Granulation Amount: None Present (0%) Exposed Structure Necrotic Amount: Large (67-100%) Fat Layer (Subcutaneous Tissue) Exposed: No Necrotic Quality: Eschar, [facility] Treatment Notes Wound #6 (Toe Third) Wound Laterality: Right Cleanser Soap and Water Discharge Instruction: Gently cleanse wound with antibacterial soap, rinse and pat dry prior to dressing wounds Peri-Wound Care Topical Betadine Discharge Instruction: Apply betadine as directed. Primary Dressing Gauze Discharge Instruction: As directed: dry Secondary Dressing ABD Pad 5x9 (in/in) Discharge Instruction: Cover with ABD pad Secured With Medipore T - 99M Medipore H Soft Cloth Surgical T ape ape, 2x2 (in/yd) Kerlix Roll Sterile or Non-Sterile 6-ply 4.5x4 (yd/yd) Discharge Instruction: Apply Kerlix as directed Compression Wrap Compression Stockings Add-Ons Electronic Signature(s) Signed: 05/24/2022 4:21:36 PM By: Angelina Pih Signed: 05/26/2022 11:09:32 AM By: Elliot Gurney, BSN, RN, CWS, Kim RN, BSN Previous Signature: 05/21/2022 1:21:35 PM Version By: Elliot Gurney, BSN, RN, CWS, Kim RN, BSN Entered By: Angelina Pih on 05/24/2022 16:15:29 Vitals Details -------------------------------------------------------------------------------- [name] ([id number]) [id number] Page 12 of 12 Patient Name: Date of Service: TAYJAH, LOBDELL [facility] 05/21/2022 11:15 A M Medical Record Number: [id number] Patient Account Number: [id number] Date of Birth/Sex: Treating RN: [birth year] (52  y.o. Cathlean Cower, Kim Primary Care Tillman Kazmierski: Marcelino Duster Other Clinician: Referring Ayriana Wix: Treating Kamau Weatherall/Extender: Joylene Grapes in Treatment: 2 Vital Signs Time Taken: 11:20 Temperature (F): 97.9 Height (in): 66 Pulse (bpm): 87 Weight (lbs): 118 Respiratory Rate (breaths/min): 16 Body Mass Index (BMI): 19 Blood Pressure (mmHg): 91/60 Reference Range: 80 - 120 mg / dl Electronic Signature(s) Signed: 05/21/2022 1:21:35 PM By: Elliot Gurney, BSN, RN, CWS, Kim RN, BSN Entered By: Elliot Gurney, BSN, RN, CWS, Kim on 05/21/2022 11:20:39

## 2022-05-22 NOTE — Progress Notes (Addendum)
Beth Carroll, Beth Carroll ([id number]) [id number] Page 1 of 7 Visit Report for 05/21/2022 Chief Complaint Document Details Patient Name: Date of Service: Beth Carroll, Beth Carroll [facility] 05/21/2022 11:15 A M Medical Record Number: [id number] Patient Account Number: [id number] Date of Birth/Sex: Treating RN: [birth year] (52 y.o. Skip Mayer Primary Care Provider: Marcelino Duster Other Clinician: Referring Provider: Treating Provider/Extender: Joylene Grapes in Treatment: 2 Information Obtained from: Patient Chief Complaint Bilateral LE Ulcers Electronic Signature(s) Signed: 05/21/2022 11:12:17 AM By: Allen Derry PA-C Entered By: Allen Derry on 05/21/2022 11:12:17 -------------------------------------------------------------------------------- HPI Details Patient Name: Date of Service: Beth Carroll, Beth Carroll 05/21/2022 11:15 A M Medical Record Number: [id number] Patient Account Number: [id number] Date of Birth/Sex: Treating RN: [birth year] (52 y.o. Skip Mayer Primary Care Provider: Marcelino Duster Other Clinician: Referring Provider: Treating Provider/Extender: Joylene Grapes in Treatment: 2 History of Present Illness HPI Description: 05-07-2022 upon evaluation today patient appears to be doing somewhat poorly in regard to her right foot in general. She subsequently also has a wound on the sacral area but this is a very superficial stage II pressure ulcer. Nonetheless the foot in particular although I could not get in epic I gleaned is much as I could from her story of what exactly happened here. T backtrack it does appear that she was in the hospital for diabetic ketoacidosis and this o was back in March 2024. With that being said during the time that she was in the hospital she also apparently had an issue with having a thrombus in the right leg in the ankle/foot region which ended up with her having essentially complete loss of blood flow to her foot where  it was turning purple and discoloring rapidly. She ended up having a nurse evaluate this and ended up with a stat angiography to go in and find what was going on they were able to identify the clot clear this out and subsequently restore blood flow. However this was not without already having damage to the distal portion of her toes. With that being said the good news is she seems to have good blood flow at the moment I was able to recheck and that we were able to get a good pulse with Doppler. With that being said I think that a lot of the injury to her toes is more of a degloving type injury at this point which hopefully will heal the first and second toes are the main ones that are of concern in my opinion the others I think just have blisters which will come off easily and should heal quite readily. The patient tells me that her blood sugars are still elevated but not as bad as what they have been previous. Again she has had multiple instances of having fairly significant DKA based on what I am hearing. Patient does have a history of peripheral vascular disease of the right lower extremity, stage II pressure ulcer to the sacrum that occurred while she was in the hospital, diabetes mellitus type 2, congestive heart failure, and hypertension. Her most recent hemoglobin A1c was 9.8 that was on 04-23-2022 05-13-2022 upon evaluation today patient appears to be doing decently well with regard to her wounds in general. With that being said it is the great toe and the second toe and the most concerned I am not certain that the tip of the great toe survive. Nonetheless I am good I have a look at what we can see today. 05-21-2022 upon evaluation today patient appears to  be doing well currently in regard to her wounds all things considered. Fortunately I do not see any signs of active infection locally nor systemically which is great news but at the same time I do believe that she is making progress but just  doing so fairly slowly. She still seems to have good arterial flow at this point. 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 I think she could be a hyperbaric candidate considering that she does have diabetes and again were not seeing a lot of significant improvement here. I think this is something that we should at least contemplate and look into it may be something that she would be interested in but I do not know for sure having to discuss it with her next week whenever she comes in for evaluation. Electronic Signature(s) Signed: 05/21/2022 12:28:31 PM By: Allen Derry PA-C Entered By: Allen Derry on 05/21/2022 12:28:31 -------------------------------------------------------------------------------- Physical Exam Details Patient Name: Date of Service: Beth Carroll, Beth Carroll 05/21/2022 11:15 A M Medical Record Number: [id number] Patient Account Number: [id number] Date of Birth/Sex: Treating RN: [birth year] (52 y.o. Skip Mayer [place], [place] ([id number]) [phone].pdf Page 2 of 7 Primary Care Provider: Marcelino Duster Other Clinician: Referring Provider: Treating Provider/Extender: Joylene Grapes in Treatment: 2 Constitutional Well-nourished and well-hydrated in no acute distress. Respiratory normal breathing without difficulty. Psychiatric this patient is able to make decisions and demonstrates good insight into disease process. Alert and Oriented x 3. pleasant and cooperative. Notes Upon inspection patient's wound bed actually showed signs of good granulation epithelization at this point. Fortunately I do not see any signs of infection locally nor systemically which is great news and overall I am extremely pleased with where we stand currently. No fevers, chills, nausea, vomiting, or diarrhea. Electronic Signature(s) Signed: 05/21/2022 12:28:53 PM By: Allen Derry PA-C Entered By: Allen Derry on 05/21/2022  12:28:53 -------------------------------------------------------------------------------- Physician Orders Details Patient Name: Date of Service: Beth Carroll, Beth Carroll [facility] 05/21/2022 11:15 A M Medical Record Number: [id number] Patient Account Number: [id number] Date of Birth/Sex: Treating RN: October 03, 1970 (52 y.o. Cathlean Cower, Kim Primary Care Provider: Marcelino Duster Other Clinician: Referring Provider: Treating Provider/Extender: Joylene Grapes in Treatment: 2 Verbal / Phone Orders: No Diagnosis Coding ICD-10 Coding Code Description I73.89 Other specified peripheral vascular diseases L97.512 Non-pressure chronic ulcer of other part of right foot with fat layer exposed L97.522 Non-pressure chronic ulcer of other part of left foot with fat layer exposed L89.152 Pressure ulcer of sacral region, stage 2 L89.616 Pressure-induced deep tissue damage of right heel L89.626 Pressure-induced deep tissue damage of left heel 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 Nurse Visit as needed Home Health Home Health Company: - [facility] for wound care. May utilize formulary equivalent dressing for wound treatment orders unless otherwise specified. Home Health Nurse may visit PRN to address patients wound care needs. Scheduled days for dressing changes to be completed; exception, patient has scheduled wound care visit that day. **Please direct any NON-WOUND related issues/requests for orders to patient's Primary Care Physician. **If current dressing causes regression in wound condition, may D/C ordered dressing product/s and apply Normal Saline Moist Dressing daily until next Wound Healing Center or Other MD appointment. **Notify Wound Healing Center of regression in wound condition at  [phone]. Bathing/ [employer] wounds with antibacterial soap and water. -  dial soap recommended May shower; gently cleanse wound with antibacterial soap, rinse and pat dry prior to dressing wounds No tub bath. Anesthetic (Use 'Patient Medications' Section for Anesthetic Order Entry) Lidocaine applied to wound bed Edema Control - Lymphedema / Segmental Compressive Device / Other Elevate, Exercise Daily and A void Standing for Long Periods of Time. Elevate legs to the level of the heart and pump ankles as often as possible Elevate leg(s) parallel to the floor when sitting. DO YOUR BEST to sleep in the bed at night. DO NOT sleep in your recliner. Long hours of sitting in a recliner leads to swelling of the legs [place], Beth Carroll ([id number]) [id number] Page 3 of 7 and/or potential wounds on your backside. Off-Loading Turn and reposition every 2 hours - keep pressure off sacrum Wound Treatment Wound #1 - T Second oe Wound Laterality: Right Cleanser: Soap and Water 1 x Per Day/30 Days Discharge Instructions: Gently cleanse wound with antibacterial soap, rinse and pat dry prior to dressing wounds Topical: Betadine 1 x Per Day/30 Days Discharge Instructions: Apply betadine as directed. Prim Dressing: Gauze 1 x Per Day/30 Days ary Discharge Instructions: As directed: dry Secondary Dressing: ABD Pad 5x9 (in/in) 1 x Per Day/30 Days Discharge Instructions: Cover with ABD pad Secured With: Medipore T - 67M Medipore H Soft Cloth Surgical T ape ape, 2x2 (in/yd) 1 x Per Day/30 Days Secured With: [employer] Sterile or Non-Sterile 6-ply 4.5x4 (yd/yd) 1 x Per Day/30 Days Discharge Instructions: Apply Kerlix as directed Wound #2 - T Great oe Wound Laterality: Right Cleanser: Soap and Water 1 x Per Day/30 Days Discharge Instructions: Gently cleanse wound with antibacterial soap, rinse and pat dry prior to dressing wounds Topical: Betadine 1 x Per Day/30  Days Discharge Instructions: Apply betadine as directed. Prim Dressing: Gauze 1 x Per Day/30 Days ary Discharge Instructions: As directed: dry Secondary Dressing: ABD Pad 5x9 (in/in) 1 x Per Day/30 Days Discharge Instructions: Cover with ABD pad Secured With: Medipore T - 67M Medipore H Soft Cloth Surgical T ape ape, 2x2 (in/yd) 1 x Per Day/30 Days Secured With: [employer] Sterile or Non-Sterile 6-ply 4.5x4 (yd/yd) 1 x Per Day/30 Days Discharge Instructions: Apply Kerlix as directed Wound #5 - Calcaneus Wound Laterality: Right Cleanser: Soap and Water 3 x Per Week/30 Days Discharge Instructions: Gently cleanse wound with antibacterial soap, rinse and pat dry prior to dressing wounds Prim Dressing: Silvercel 4 1/4x 4 1/4 (in/in) 3 x Per Week/30 Days ary Discharge Instructions: Apply Silvercel 4 1/4x 4 1/4 (in/in) as instructed Secondary Dressing: ABD Pad 5x9 (in/in) 3 x Per Week/30 Days Discharge Instructions: Cover with ABD pad Secured With: Medipore T - 67M Medipore H Soft Cloth Surgical T ape ape, 2x2 (in/yd) 3 x Per Week/30 Days Secured With: [employer] or Non-Sterile 6-ply 4.5x4 (yd/yd) 3 x Per Week/30 Days Discharge Instructions: Apply Kerlix as directed Wound #6 - T Third oe Wound Laterality: Right Cleanser: Soap and Water 1 x Per Day/30 Days Discharge Instructions: Gently cleanse wound with antibacterial soap, rinse and pat dry prior to dressing wounds Topical: Betadine 1 x Per Day/30 Days Discharge Instructions: Apply betadine as directed. Prim Dressing: Gauze 1 x Per Day/30 Days ary Discharge Instructions: As directed: dry Secondary Dressing: ABD Pad 5x9 (in/in) 1 x Per Day/30 Days Discharge Instructions: Cover with ABD pad Secured With: Medipore T - 67M Medipore H Soft Cloth Surgical T ape ape, 2x2 (in/yd) 1 x Per Day/30 Days Secured With: [employer] or  Non-Sterile 6-ply 4.5x4 (yd/yd) 1 x Per Day/30 Days Discharge Instructions: Apply Kerlix as  directed Beth Carroll, Beth Carroll ([id number]) [id number] Page 4 of 7 Electronic Signature(s) Signed: 05/21/2022 12:43:01 PM By: Allen Derry PA-C Signed: 05/21/2022 1:21:35 PM By: Elliot Gurney, BSN, RN, CWS, Kim RN, BSN Entered By: Elliot Gurney, BSN, RN, CWS, Kim on 05/21/2022 12:08:16 -------------------------------------------------------------------------------- Problem List Details Patient Name: Date of Service: Beth Carroll, Beth Carroll [facility] 05/21/2022 11:15 A M Medical Record Number: [id number] Patient Account Number: [id number] Date of Birth/Sex: Treating RN: 02-12-1970 (52 y.o. Cathlean Cower, Kim Primary Care Provider: Marcelino Duster Other Clinician: Referring Provider: Treating Provider/Extender: Joylene Grapes in Treatment: 2 Active Problems ICD-10 Encounter Code Description Active Date MDM Diagnosis I73.89 Other specified peripheral vascular diseases 05/06/2022 No Yes L97.512 Non-pressure chronic ulcer of other part of right foot with fat layer exposed 05/06/2022 No Yes L97.522 Non-pressure chronic ulcer of other part of left foot with fat layer exposed 05/06/2022 No Yes L89.152 Pressure ulcer of sacral region, stage 2 05/06/2022 No Yes L89.616 Pressure-induced deep tissue damage of right heel 05/06/2022 No Yes L89.626 Pressure-induced deep tissue damage of left heel 05/06/2022 No Yes E11.621 Type 2 diabetes mellitus with foot ulcer 05/06/2022 No Yes I50.42 Chronic combined systolic (congestive) and diastolic (congestive) heart failure 05/06/2022 No Yes I10 Essential (primary) hypertension 05/06/2022 No Yes Inactive Problems Resolved Problems Electronic Signature(s) Signed: 05/21/2022 11:12:12 AM By: Allen Derry PA-C Entered By: Allen Derry on 05/21/2022 11:12:12 Progress Note Details -------------------------------------------------------------------------------- [name] ([id number]) [id number] Page 5 of 7 Patient Name: Date of  Service: Beth Carroll, Beth Carroll 05/21/2022 11:15 A M Medical Record Number: [id number] Patient Account Number: [id number] Date of Birth/Sex: Treating RN: Feb 11, 1970 (52 y.o. Cathlean Cower, Kim Primary Care Provider: Marcelino Duster Other Clinician: Referring Provider: Treating Provider/Extender: Joylene Grapes in Treatment: 2 Subjective Chief Complaint Information obtained from Patient Bilateral LE Ulcers History of Present Illness (HPI) 05-07-2022 upon evaluation today patient appears to be doing somewhat poorly in regard to her right foot in general. She subsequently also has a wound on the sacral area but this is a very superficial stage II pressure ulcer. Nonetheless the foot in particular although I could not get in epic I gleaned is much as I could from her story of what exactly happened here. T backtrack it does appear that she was in the hospital for diabetic ketoacidosis and this was back in March o 2024. 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 She ended up having a nurse evaluate this and ended up with a stat angiography to go in and find what was going on they were able to identify the clot clear this out and subsequently restore blood flow. However this was not without already having damage to the distal portion of her toes. With that being said the good news is she seems to have good blood flow at the moment I was able to recheck and that we were able to get a good pulse with Doppler. With that being said I think that a lot of the injury to her toes is more of a degloving type injury at this point which hopefully will heal the first and second toes are the main ones that are of concern in  my opinion the others I think just  have blisters which will come off easily and should heal quite readily. The patient tells me that her blood sugars are still elevated but not as bad as what they have been previous. Again she has had multiple instances of having fairly significant DKA based on what I am hearing. Patient does have a history of peripheral vascular disease of the right lower extremity, stage II pressure ulcer to the sacrum that occurred while she was in the hospital, diabetes mellitus type 2, congestive heart failure, and hypertension. Her most recent hemoglobin A1c was 9.8 that was on 04-23-2022 05-13-2022 upon evaluation today patient appears to be doing decently well with regard to her wounds in general. With that being said it is the great toe and the second toe and the most concerned I am not certain that the tip of the great toe survive. Nonetheless I am good I have a look at what we can see today. 05-21-2022 upon evaluation today patient appears to be doing well currently in regard to her wounds all things considered. Fortunately I do not see any signs of active infection locally nor systemically which is great news but at the same time I do believe that she is making progress but just doing so fairly slowly. She still seems to have good arterial flow at this point. 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 I think she could be a hyperbaric candidate considering that she does have diabetes and again were not seeing a lot of significant improvement here. I think this is something that we should at least contemplate and look into it may be something that she would be interested in but I do not know for sure having to discuss it with her next week whenever she comes in for evaluation. Objective Constitutional Well-nourished and well-hydrated in no acute distress. Vitals Time Taken: 11:20 AM, Height: 66 in, Weight: 118 lbs, BMI: 19, Temperature: 97.9 F, Pulse: 87 bpm, Respiratory Rate: 16  breaths/min, Blood Pressure: 91/60 mmHg. Respiratory normal breathing without difficulty. Psychiatric this patient is able to make decisions and demonstrates good insight into disease process. Alert and Oriented x 3. pleasant and cooperative. General Notes: Upon inspection patient's wound bed actually showed signs of good granulation epithelization at this point. Fortunately I do not see any signs of infection locally nor systemically which is great news and overall I am extremely pleased with where we stand currently. No fevers, chills, nausea, vomiting, or diarrhea. Integumentary (Hair, Skin) Wound #1 status is Open. Original cause of wound was Gradually Appeared. The date acquired was: 04/21/2022. The wound has been in treatment 2 weeks. The wound is located on the Right T Second. The wound measures 2.5cm length x 3cm width x 0.1cm depth; 5.89cm^2 area and 0.589cm^3 volume. There is a oe medium amount of serosanguineous drainage noted. There is no granulation within the wound bed. There is a large (67-100%) amount of necrotic tissue within the wound bed including Eschar. Wound #2 status is Open. Original cause of wound was Gradually Appeared. The date acquired was: 04/21/2022. The wound has been in treatment 2 weeks. The wound is located on the Right T Great. The wound measures 9.4cm length x 3cm width x 0.1cm depth; 22.148cm^2 area and 2.215cm^3 volume. There is no oe tunneling or undermining noted. There is a none present amount of drainage noted. There is no granulation within the wound bed. There is a large (67-100%) amount of  necrotic tissue within the wound bed including Eschar. Wound #5 status is Open. Original cause of wound was Gradually Appeared. The date acquired was: 04/21/2022. The wound has been in treatment 2 weeks. The wound is located on the Right Calcaneus. The wound measures 3cm length x 4cm width x 0.1cm depth; 9.425cm^2 area and 0.942cm^3 volume. There is Fat Layer  (Subcutaneous Tissue) exposed. There is a medium amount of serosanguineous drainage noted. There is medium (34-66%) red, pink granulation within the wound bed. There is a medium (34-66%) amount of necrotic tissue within the wound bed including Eschar and Adherent Slough. Wound #6 status is Open. Original cause of wound was Gradually Appeared. The date acquired was: 04/21/2022. The wound has been in treatment 2 weeks. The wound is located on the Right T Third. The wound measures 0.4cm length x 0.5cm width x 0.1cm depth; 0.157cm^2 area and 0.016cm^3 volume. There is a oe medium amount of serosanguineous drainage noted. There is no granulation within the wound bed. There is a large (67-100%) amount of necrotic tissue within [place], Beth Carroll ([id number]) [id number] Page 6 of 7 the wound bed including Eschar and Adherent Slough. Assessment Active Problems ICD-10 Other specified peripheral vascular diseases Non-pressure chronic ulcer of other part of right foot with fat layer exposed Non-pressure chronic ulcer of other part of left foot with fat layer exposed Pressure ulcer of sacral region, stage 2 Pressure-induced deep tissue damage of right heel Pressure-induced deep tissue damage of left heel Type 2 diabetes mellitus with foot ulcer Chronic combined systolic (congestive) and diastolic (congestive) heart failure Essential (primary) hypertension Plan Follow-up Appointments: Return Appointment in 1 week. Nurse Visit as needed Home Health: Home Health Company: - [facility] for wound care. May utilize formulary equivalent dressing for wound treatment orders unless otherwise specified. Home Health Nurse may visit PRN to address patientoos wound care needs. Scheduled days for dressing changes to be completed; exception, patient has scheduled wound care visit that day. **Please direct any NON-WOUND related issues/requests for orders to patient's  Primary Care Physician. **If current dressing causes regression in wound condition, may D/C ordered dressing product/s and apply Normal Saline Moist Dressing daily until next Wound Healing Center or Other MD appointment. **Notify Wound Healing Center of regression in wound condition at [phone]. Bathing/ Shower/ Hygiene: Wash wounds with antibacterial soap and water. - dial soap recommended May shower; gently cleanse wound with antibacterial soap, rinse and pat dry prior to dressing wounds No tub bath. Anesthetic (Use 'Patient Medications' Section for Anesthetic Order Entry): Lidocaine applied to wound bed Edema Control - Lymphedema / Segmental Compressive Device / Other: Elevate, Exercise Daily and Avoid Standing for Long Periods of Time. Elevate legs to the level of the heart and pump ankles as often as possible Elevate leg(s) parallel to the floor when sitting. DO YOUR BEST to sleep in the bed at night. DO NOT sleep in your recliner. Long hours of sitting in a recliner leads to swelling of the legs and/or potential wounds on your backside. Off-Loading: Turn and reposition every 2 hours - keep pressure off sacrum WOUND #1: - T Second Wound Laterality: Right oe Cleanser: Soap and Water 1 x Per Day/30 Days Discharge Instructions: Gently cleanse wound with antibacterial soap, rinse and pat dry prior to dressing wounds Topical: Betadine 1 x Per Day/30 Days Discharge Instructions: Apply betadine as directed. Prim Dressing: Gauze 1 x Per Day/30 Days ary Discharge Instructions: As directed: dry Secondary Dressing: ABD Pad 5x9 (in/in) 1 x Per  Day/30 Days Discharge Instructions: Cover with ABD pad Secured With: Medipore T - 44M Medipore H Soft Cloth Surgical T ape ape, 2x2 (in/yd) 1 x Per Day/30 Days Secured With: Kerlix Roll Sterile or Non-Sterile 6-ply 4.5x4 (yd/yd) 1 x Per Day/30 Days Discharge Instructions: Apply Kerlix as directed WOUND #2: - T Great Wound Laterality:  Right oe Cleanser: Soap and Water 1 x Per Day/30 Days Discharge Instructions: Gently cleanse wound with antibacterial soap, rinse and pat dry prior to dressing wounds Topical: Betadine 1 x Per Day/30 Days Discharge Instructions: Apply betadine as directed. Prim Dressing: Gauze 1 x Per Day/30 Days ary Discharge Instructions: As directed: dry Secondary Dressing: ABD Pad 5x9 (in/in) 1 x Per Day/30 Days Discharge Instructions: Cover with ABD pad Secured With: Medipore T - 44M Medipore H Soft Cloth Surgical T ape ape, 2x2 (in/yd) 1 x Per Day/30 Days Secured With: [employer] Sterile or Non-Sterile 6-ply 4.5x4 (yd/yd) 1 x Per Day/30 Days Discharge Instructions: Apply Kerlix as directed WOUND #5: - Calcaneus Wound Laterality: Right Cleanser: Soap and Water 3 x Per Week/30 Days Discharge Instructions: Gently cleanse wound with antibacterial soap, rinse and pat dry prior to dressing wounds Prim Dressing: Silvercel 4 1/4x 4 1/4 (in/in) 3 x Per Week/30 Days ary Discharge Instructions: Apply Silvercel 4 1/4x 4 1/4 (in/in) as instructed Secondary Dressing: ABD Pad 5x9 (in/in) 3 x Per Week/30 Days Discharge Instructions: Cover with ABD pad Secured With: Medipore T - 44M Medipore H Soft Cloth Surgical T ape ape, 2x2 (in/yd) 3 x Per Week/30 Days Secured With: [employer] or Non-Sterile 6-ply 4.5x4 (yd/yd) 3 x Per Week/30 Days Discharge Instructions: Apply Kerlix as directed WOUND #6: - T Third Wound Laterality: Right oe Cleanser: Soap and Water 1 x Per Day/30 Days Discharge Instructions: Gently cleanse wound with antibacterial soap, rinse and pat dry prior to dressing wounds Topical: Betadine 1 x Per Day/30 Days Beth Carroll, Beth Carroll ([id number]) [id number] Page 7 of 7 Discharge Instructions: Apply betadine as directed. Prim Dressing: Gauze 1 x Per Day/30 Days ary Discharge Instructions: As directed: dry Secondary Dressing: ABD Pad 5x9 (in/in) 1 x Per Day/30  Days Discharge Instructions: Cover with ABD pad Secured With: Medipore T - 44M Medipore H Soft Cloth Surgical T ape ape, 2x2 (in/yd) 1 x Per Day/30 Days Secured With: Kerlix Roll Sterile or Non-Sterile 6-ply 4.5x4 (yd/yd) 1 x Per Day/30 Days Discharge Instructions: Apply Kerlix as directed 1. 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 Again this is something we can keep a close eye on. 2. I am also can recommend that the patient should continue to use the Betadine daily. 3. I am also going to recommend that we continue with the ABD pads to cover and roll gauze to secure in place. We will see patient back for reevaluation in 1 week here in the clinic. If anything worsens or changes patient will contact our office for additional recommendations. Electronic Signature(s) Signed: 05/21/2022 12:29:31 PM By: Allen Derry PA-C Entered By: Allen Derry on 05/21/2022 12:29:31 -------------------------------------------------------------------------------- SuperBill Details Patient Name: Date of Service: KATELYNNE, REVAK [facility] 05/21/2022 Medical Record Number: [id number] Patient Account Number: [id number] Date of Birth/Sex: Treating RN: 1970-11-24 (52 y.o. Skip Mayer Primary Care Provider: Marcelino Duster Other Clinician: Referring Provider: Treating Provider/Extender: Youlanda Roys, Vivianne Spence in  Treatment: 2 Diagnosis Coding ICD-10 Codes Code Description I73.89 Other specified peripheral vascular diseases L97.512 Non-pressure chronic ulcer of other part of right foot with fat layer exposed L97.522 Non-pressure chronic ulcer of other part of left foot with fat layer exposed L89.152 Pressure ulcer of sacral region, stage 2 L89.616 Pressure-induced deep tissue damage of right heel L89.626 Pressure-induced deep tissue damage  of left heel E11.621 Type 2 diabetes mellitus with foot ulcer I50.42 Chronic combined systolic (congestive) and diastolic (congestive) heart failure I10 Essential (primary) hypertension Facility Procedures : CPT4 Code: 64332951 Description: 99214 - WOUND CARE VISIT-LEV 4 EST PT Modifier: Quantity: 1 Physician Procedures : CPT4 Code Description Modifier [id number] 99213 - WC PHYS LEVEL 3 - EST PT ICD-10 Diagnosis Description I73.89 Other specified peripheral vascular diseases L97.512 Non-pressure chronic ulcer of other part of right foot with fat layer exposed L97.522  Non-pressure chronic ulcer of other part of left foot with fat layer exposed L89.152 Pressure ulcer of sacral region, stage 2 Quantity: 1 Electronic Signature(s) Signed: 05/21/2022 12:29:54 PM By: Allen Derry PA-C Entered By: Allen Derry on 05/21/2022 12:29:54

## 2022-05-27 ENCOUNTER — Encounter (INDEPENDENT_AMBULATORY_CARE_PROVIDER_SITE_OTHER): Payer: Self-pay | Admitting: Nurse Practitioner

## 2022-05-27 ENCOUNTER — Ambulatory Visit (INDEPENDENT_AMBULATORY_CARE_PROVIDER_SITE_OTHER): Payer: Medicaid Other | Admitting: Nurse Practitioner

## 2022-05-27 ENCOUNTER — Ambulatory Visit (INDEPENDENT_AMBULATORY_CARE_PROVIDER_SITE_OTHER): Payer: Medicaid Other

## 2022-05-27 VITALS — BP 98/65 | HR 76 | Resp 15

## 2022-05-27 DIAGNOSIS — L97419 Non-pressure chronic ulcer of right heel and midfoot with unspecified severity: Secondary | ICD-10-CM

## 2022-05-27 DIAGNOSIS — I96 Gangrene, not elsewhere classified: Secondary | ICD-10-CM

## 2022-05-27 DIAGNOSIS — I70229 Atherosclerosis of native arteries of extremities with rest pain, unspecified extremity: Secondary | ICD-10-CM | POA: Diagnosis not present

## 2022-05-27 DIAGNOSIS — I10 Essential (primary) hypertension: Secondary | ICD-10-CM

## 2022-05-28 ENCOUNTER — Telehealth (INDEPENDENT_AMBULATORY_CARE_PROVIDER_SITE_OTHER): Payer: Self-pay

## 2022-05-28 NOTE — H&P (View-Only) (Signed)
 Subjective:    Patient ID: Beth Carroll, female    DOB: 01/18/1971, 51 y.o.   MRN: [id number] Chief Complaint  Patient presents with   Follow-up    Ultrasound follow up    Beth Carroll is a 51-year-old female that presents today for follow-up after recent angiogram of the right lower extremity.  The patient previously had a heel ulceration that in the midst of her ischemia began to reopen.  This has been receiving excellent care while wound care center and has been making progress.  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  This has not been improving over the last month.  Currently she denies any issues with the left lower extremity.  Today noninvasive studies show an ABI 1.49 on the right and 1.50 on the left.  She does have triphasic waveforms bilaterally with triphasic/biphasic waveforms on the right.  No toe waveforms on the right however this is due to the gangrenous changes.  Normal toe waveforms on the left.    Review of Systems  Cardiovascular:  Positive for leg swelling.  Skin:  Positive for wound.  Neurological:  Positive for weakness and numbness.  All other systems reviewed and are negative.      Objective:   Physical Exam Vitals reviewed.  HENT:     Head: Normocephalic.  Cardiovascular:     Rate and Rhythm: Normal rate.  Pulmonary:     Effort: Pulmonary effort is normal.  Musculoskeletal:     Right lower leg: 2+ Pitting Edema present.     Left lower leg: 2+ Pitting Edema present.  Skin:    General: Skin is warm and dry.  Neurological:     Mental Status: She is alert and oriented to person, place, and time.  Psychiatric:        Mood and Affect: Mood normal.        Behavior: Behavior normal.        Thought Content: Thought content normal.        Judgment: Judgment normal.     BP 98/65 (BP Location: Right Arm)   Pulse 76   Resp 15   LMP 01/26/2015   Past Medical History:   Diagnosis Date   AKI (acute kidney injury) (HCC) 04/30/2022   Coronary artery disease    Diabetes mellitus without complication (HCC)    Diabetes mellitus, type 2 (HCC)    HFrEF (heart failure with reduced ejection fraction) (HCC)    High anion gap metabolic acidosis 04/30/2022   Hyperlipemia    Hypertension    Hypophosphatemia 04/30/2022   Ischemic cardiomyopathy    Nausea vomiting and diarrhea 04/30/2022   Orthostatic hypotension    PAD (peripheral artery disease) (HCC)    Sepsis secondary to UTI (HCC) 04/30/2022   Tobacco use     Social History   Socioeconomic History   Marital status: Single    Spouse name: Not on file   Number of children: Not on file   Years of education: Not on file   Highest education level: Not on file  Occupational History   Not on file  Tobacco Use   Smoking status: Former    Types: Cigarettes    Quit date: 12/09/2020    Years since quitting: 1.4   Smokeless tobacco: Never  Vaping Use   Vaping Use: Never used  Substance and Sexual Activity   Alcohol use: Never   Drug use:   Not on file   Sexual activity: Not on file  Other Topics Concern   Not on file  Social History Narrative   Not on file   Social Determinants of Health   Financial Resource Strain: Not on file  Food Insecurity: No Food Insecurity (04/22/2022)   Hunger Vital Sign    Worried About Running Out of Food in the Last Year: Never true    Ran Out of Food in the Last Year: Never true  Transportation Needs: No Transportation Needs (04/22/2022)   PRAPARE - Transportation    Lack of Transportation (Medical): No    Lack of Transportation (Non-Medical): No  Physical Activity: Not on file  Stress: Not on file  Social Connections: Not on file  Intimate Partner Violence: Not At Risk (04/22/2022)   Humiliation, Afraid, Rape, and Kick questionnaire    Fear of Current or Ex-Partner: No    Emotionally Abused: No    Physically Abused: No    Sexually Abused: No    Past Surgical  History:  Procedure Laterality Date   CORONARY STENT INTERVENTION N/A 12/15/2020   Procedure: CORONARY STENT INTERVENTION;  Surgeon: Arida, Muhammad A, MD;  Location: [facility];  Service: Cardiovascular;  Laterality: N/A;   LEFT HEART CATH AND CORONARY ANGIOGRAPHY N/A 12/15/2020   Procedure: LEFT HEART CATH AND CORONARY ANGIOGRAPHY;  Surgeon: Arida, Muhammad A, MD;  Location: [facility];  Service: Cardiovascular;  Laterality: N/A;   LOWER EXTREMITY ANGIOGRAPHY Left 12/10/2020   Procedure: Lower Extremity Angiography;  Surgeon: Dew, Jason S, MD;  Location: [facility];  Service: Cardiovascular;  Laterality: Left;   LOWER EXTREMITY ANGIOGRAPHY Right 12/25/2020   Procedure: LOWER EXTREMITY ANGIOGRAPHY;  Surgeon: Dew, Jason S, MD;  Location: [facility];  Service: Cardiovascular;  Laterality: Right;   LOWER EXTREMITY ANGIOGRAPHY Right 08/19/2021   Procedure: Lower Extremity Angiography;  Surgeon: Dew, Jason S, MD;  Location: [facility];  Service: Cardiovascular;  Laterality: Right;   LOWER EXTREMITY ANGIOGRAPHY Right 08/19/2021   Procedure: Lower Extremity Angiography;  Surgeon: Dew, Jason S, MD;  Location: [facility];  Service: Cardiovascular;  Laterality: Right;   LOWER EXTREMITY ANGIOGRAPHY Right 04/24/2022   Procedure: Lower Extremity Angiography;  Surgeon: Pharr, Tarkten, MD;  Location: [facility];  Service: Cardiovascular;  Laterality: Right;   ORIF FEMUR FRACTURE Right 12/14/2021   Procedure: OPEN REDUCTION INTERNAL FIXATION RIGHT DISTAL FEMUR;  Surgeon: Haddix, Kevin P, MD;  Location: [facility];  Service: Orthopedics;  Laterality: Right;    Family History  Problem Relation Age of Onset   Hypertension Father     No Known Allergies     Latest Ref Rng & Units 04/28/2022    9:03 AM 04/27/2022    6:35 AM 04/26/2022    1:45 AM  CBC  WBC 4.0 - 10.5 K/uL 4.9  5.1  5.8   Hemoglobin 12.0 - 15.0 g/dL 8.3  8.0  8.2   Hematocrit 36.0 -  46.0 % 28.0  25.9  26.9   Platelets 150 - 400 K/uL 232  184  165       CMP     Component Value Date/Time   NA 141 04/28/2022 0903   NA 137 03/18/2021 1541   K 3.0 (L) 04/28/2022 0903   CL 109 04/28/2022 0903   CO2 27 04/28/2022 0903   GLUCOSE 123 (H) 04/28/2022 0903   BUN 8 04/28/2022 0903   BUN 15 03/18/2021 1541     CREATININE 0.30 (L) 04/28/2022 0903   CALCIUM 8.5 (L) 04/28/2022 0903   PROT 7.7 04/21/2022 2336   ALBUMIN 3.8 04/21/2022 2336   AST 18 04/21/2022 2336   ALT 17 04/21/2022 2336   ALKPHOS 175 (H) 04/21/2022 2336   BILITOT 2.9 (H) 04/21/2022 2336   GFRNONAA >60 04/28/2022 0903     No results found.     Assessment & Plan:   1. Heel ulceration, right, with unspecified severity (HCC) The wound has been showing some problems with healing.  There appears to be significant granulation and there has been improvement noted by wound care.  Based on her noninvasive studies today she should have adequate perfusion for wound healing.  Will continue to maintain and watch her progress with wound healing given her history of episodes of ischemia.  2. Primary hypertension Continue antihypertensive medications as already ordered, these medications have been reviewed and there are no changes at this time.  3. Gangrene (HCC) The patient currently has gangrenous changes of her right first and second toe.  This has been present for approximately a month and there is no evidence of healing.  I believe that at this time the wounds have fully demarcated and because there is not any evidence of wound healing especially after excellent wound care done by the wound care center, it would be in the patient's best interest to proceed with amputation to prevent further possible infection or complications.  I have discussed the risk benefits and procedures with the patient and she is agreeable to proceed.   Current Outpatient Medications on File Prior to Visit  Medication Sig Dispense Refill    acetaminophen (TYLENOL) 650 MG CR tablet Take 650 mg by mouth every 8 (eight) hours as needed for pain.     albuterol (VENTOLIN HFA) 108 (90 Base) MCG/ACT inhaler Inhale 2 puffs into the lungs every 6 (six) hours as needed for wheezing or shortness of breath.     apixaban (ELIQUIS) 5 MG TABS tablet Take 1 tablet (5 mg total) by mouth 2 (two) times daily. 60 tablet 3   aspirin EC 81 MG tablet Take 81 mg by mouth daily. Swallow whole.     atorvastatin (LIPITOR) 80 MG tablet Take 1 tablet (80 mg total) by mouth daily. 30 tablet 2   barrier cream (NON-SPECIFIED) CREA Apply 1 Application topically 2 (two) times daily as needed.     calcium carbonate (TUMS - DOSED IN MG ELEMENTAL CALCIUM) 500 MG chewable tablet Chew 1 tablet (200 mg of elemental calcium total) by mouth 3 (three) times daily before meals.     carvedilol (COREG) 3.125 MG tablet Hold until followup with outpatient provider due to intermittent low blood pressure.     clopidogrel (PLAVIX) 75 MG tablet Take 1 tablet (75 mg total) by mouth daily. 30 tablet 2   feeding supplement (ENSURE ENLIVE / ENSURE PLUS) LIQD Take 237 mLs by mouth 3 (three) times daily between meals. 237 mL 12   hydrochlorothiazide (HYDRODIURIL) 25 MG tablet Hold until followup with outpatient provider due to intermittent low blood pressure.     insulin glargine (LANTUS SOLOSTAR) 100 UNIT/ML Solostar Pen Inject 25 Units into the skin daily.     Multiple Vitamins-Minerals (CENTRUM SILVER 50+WOMEN PO) Take 1 tablet by mouth daily.     nutrition supplement, JUVEN, (JUVEN) PACK Take 1 packet by mouth 2 (two) times daily between meals. 60 each 0   Nystatin (GERHARDT'S BUTT CREAM) CREA Apply 1 Application topically 3 (three) times   daily. Clean sacrum/buttocks with soap and water, apply Gerhardts Butt Cream three times daily and prn soiling. 1 each 1   nystatin cream (MYCOSTATIN) Apply 1 Application topically 2 (two) times daily.     pantoprazole (PROTONIX) 40 MG tablet Take 1  tablet (40 mg total) by mouth daily. 30 tablet 0   tiotropium (SPIRIVA) 18 MCG inhalation capsule Place 1 capsule into inhaler and inhale daily.     traMADol (ULTRAM) 50 MG tablet Take 50 mg by mouth every 6 (six) hours as needed for severe pain or moderate pain.     Vitamin D, Ergocalciferol, (DRISDOL) 1.25 MG (50000 UNIT) CAPS capsule Take 50,000 Units by mouth once a week.     No current facility-administered medications on file prior to visit.    There are no Patient Instructions on file for this visit. No follow-ups on file.   Jaylee Freeze E Caylah Plouff, NP   

## 2022-05-28 NOTE — Progress Notes (Signed)
Subjective:    Patient ID: Beth Carroll, female    DOB: January 17, 1971, 52 y.o.   MRN: [id number] Chief Complaint  Patient presents with   Follow-up    Ultrasound follow up    Beth Carroll is a 52 year old female that presents today for follow-up after recent angiogram of the right lower extremity.  The patient previously had a heel ulceration that in the midst of her ischemia began to reopen.  This has been receiving excellent care while wound care center and has been making progress.  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  This has not been improving over the last month.  Currently she denies any issues with the left lower extremity.  Today noninvasive studies show an ABI 1.49 on the right and 1.50 on the left.  She does have triphasic waveforms bilaterally with triphasic/biphasic waveforms on the right.  No toe waveforms on the right however this is due to the gangrenous changes.  Normal toe waveforms on the left.    Review of Systems  Cardiovascular:  Positive for leg swelling.  Skin:  Positive for wound.  Neurological:  Positive for weakness and numbness.  All other systems reviewed and are negative.      Objective:   Physical Exam Vitals reviewed.  HENT:     Head: Normocephalic.  Cardiovascular:     Rate and Rhythm: Normal rate.  Pulmonary:     Effort: Pulmonary effort is normal.  Musculoskeletal:     Right lower leg: 2+ Pitting Edema present.     Left lower leg: 2+ Pitting Edema present.  Skin:    General: Skin is warm and dry.  Neurological:     Mental Status: She is alert and oriented to person, place, and time.  Psychiatric:        Mood and Affect: Mood normal.        Behavior: Behavior normal.        Thought Content: Thought content normal.        Judgment: Judgment normal.     BP 98/65 (BP Location: Right Arm)   Pulse 76   Resp 15   LMP 01/26/2015   Past Medical History:   Diagnosis Date   AKI (acute kidney injury) (HCC) 04/30/2022   Coronary artery disease    Diabetes mellitus without complication (HCC)    Diabetes mellitus, type 2 (HCC)    HFrEF (heart failure with reduced ejection fraction) (HCC)    High anion gap metabolic acidosis 04/30/2022   Hyperlipemia    Hypertension    Hypophosphatemia 04/30/2022   Ischemic cardiomyopathy    Nausea vomiting and diarrhea 04/30/2022   Orthostatic hypotension    PAD (peripheral artery disease) (HCC)    Sepsis secondary to UTI (HCC) 04/30/2022   Tobacco use     Social History   Socioeconomic History   Marital status: Single    Spouse name: Not on file   Number of children: Not on file   Years of education: Not on file   Highest education level: Not on file  Occupational History   Not on file  Tobacco Use   Smoking status: Former    Types: Cigarettes    Quit date: 12/09/2020    Years since quitting: 1.4   Smokeless tobacco: Never  Vaping Use   Vaping Use: Never used  Substance and Sexual Activity   Alcohol use: Never   Drug use:  Not on file   Sexual activity: Not on file  Other Topics Concern   Not on file  Social History Narrative   Not on file   Social Determinants of Health   Financial Resource Strain: Not on file  Food Insecurity: No Food Insecurity (04/22/2022)   Hunger Vital Sign    Worried About Running Out of Food in the Last Year: Never true    Ran Out of Food in the Last Year: Never true  Transportation Needs: No Transportation Needs (04/22/2022)   PRAPARE - Administrator, Civil Service (Medical): No    Lack of Transportation (Non-Medical): No  Physical Activity: Not on file  Stress: Not on file  Social Connections: Not on file  Intimate Partner Violence: Not At Risk (04/22/2022)   Humiliation, Afraid, Rape, and Kick questionnaire    Fear of Current or Ex-Partner: No    Emotionally Abused: No    Physically Abused: No    Sexually Abused: No    Past Surgical  History:  Procedure Laterality Date   CORONARY STENT INTERVENTION N/A 12/15/2020   Procedure: CORONARY STENT INTERVENTION;  Surgeon: Iran Ouch, MD;  Location: [facility];  Service: Cardiovascular;  Laterality: N/A;   LEFT HEART CATH AND CORONARY ANGIOGRAPHY N/A 12/15/2020   Procedure: LEFT HEART CATH AND CORONARY ANGIOGRAPHY;  Surgeon: Iran Ouch, MD;  Location: [facility];  Service: Cardiovascular;  Laterality: N/A;   LOWER EXTREMITY ANGIOGRAPHY Left 12/10/2020   Procedure: Lower Extremity Angiography;  Surgeon: Annice Needy, MD;  Location: [facility];  Service: Cardiovascular;  Laterality: Left;   LOWER EXTREMITY ANGIOGRAPHY Right 12/25/2020   Procedure: LOWER EXTREMITY ANGIOGRAPHY;  Surgeon: Annice Needy, MD;  Location: [facility];  Service: Cardiovascular;  Laterality: Right;   LOWER EXTREMITY ANGIOGRAPHY Right 08/19/2021   Procedure: Lower Extremity Angiography;  Surgeon: Annice Needy, MD;  Location: [facility];  Service: Cardiovascular;  Laterality: Right;   LOWER EXTREMITY ANGIOGRAPHY Right 08/19/2021   Procedure: Lower Extremity Angiography;  Surgeon: Annice Needy, MD;  Location: [facility];  Service: Cardiovascular;  Laterality: Right;   LOWER EXTREMITY ANGIOGRAPHY Right 04/24/2022   Procedure: Lower Extremity Angiography;  Surgeon: Learta Codding, MD;  Location: [facility];  Service: Cardiovascular;  Laterality: Right;   ORIF FEMUR FRACTURE Right 12/14/2021   Procedure: OPEN REDUCTION INTERNAL FIXATION RIGHT DISTAL FEMUR;  Surgeon: Roby Lofts, MD;  Location: [facility];  Service: Orthopedics;  Laterality: Right;    Family History  Problem Relation Age of Onset   Hypertension Father     No Known Allergies     Latest Ref Rng & Units 04/28/2022    9:03 AM 04/27/2022    6:35 AM 04/26/2022    1:45 AM  CBC  WBC 4.0 - 10.5 K/uL 4.9  5.1  5.8   Hemoglobin 12.0 - 15.0 g/dL 8.3  8.0  8.2   Hematocrit 36.0 -  46.0 % 28.0  25.9  26.9   Platelets 150 - 400 K/uL 232  184  165       CMP     Component Value Date/Time   NA 141 04/28/2022 0903   NA 137 03/18/2021 1541   K 3.0 (L) 04/28/2022 0903   CL 109 04/28/2022 0903   CO2 27 04/28/2022 0903   GLUCOSE 123 (H) 04/28/2022 0903   BUN 8 04/28/2022 0903   BUN 15 03/18/2021 1541  CREATININE 0.30 (L) 04/28/2022 0903   CALCIUM 8.5 (L) 04/28/2022 0903   PROT 7.7 04/21/2022 2336   ALBUMIN 3.8 04/21/2022 2336   AST 18 04/21/2022 2336   ALT 17 04/21/2022 2336   ALKPHOS 175 (H) 04/21/2022 2336   BILITOT 2.9 (H) 04/21/2022 2336   GFRNONAA >60 04/28/2022 0903     No results found.     Assessment & Plan:   1. Heel ulceration, right, with unspecified severity (HCC) The wound has been showing some problems with healing.  There appears to be significant granulation and there has been improvement noted by wound care.  Based on her noninvasive studies today she should have adequate perfusion for wound healing.  Will continue to maintain and watch her progress with wound healing given her history of episodes of ischemia.  2. Primary hypertension Continue antihypertensive medications as already ordered, these medications have been reviewed and there are no changes at this time.  3. Gangrene (HCC) The patient currently has gangrenous changes of her right first and second toe.  This has been present for approximately a month and there is no evidence of healing.  I believe that at this time the wounds have fully demarcated and because there is not any evidence of wound healing especially after excellent wound care done by the wound care center, it would be in the patient's best interest to proceed with amputation to prevent further possible infection or complications.  I have discussed the risk benefits and procedures with the patient and she is agreeable to proceed.   Current Outpatient Medications on File Prior to Visit  Medication Sig Dispense Refill    acetaminophen (TYLENOL) 650 MG CR tablet Take 650 mg by mouth every 8 (eight) hours as needed for pain.     albuterol (VENTOLIN HFA) 108 (90 Base) MCG/ACT inhaler Inhale 2 puffs into the lungs every 6 (six) hours as needed for wheezing or shortness of breath.     apixaban (ELIQUIS) 5 MG TABS tablet Take 1 tablet (5 mg total) by mouth 2 (two) times daily. 60 tablet 3   aspirin EC 81 MG tablet Take 81 mg by mouth daily. Swallow whole.     atorvastatin (LIPITOR) 80 MG tablet Take 1 tablet (80 mg total) by mouth daily. 30 tablet 2   barrier cream (NON-SPECIFIED) CREA Apply 1 Application topically 2 (two) times daily as needed.     calcium carbonate (TUMS - DOSED IN MG ELEMENTAL CALCIUM) 500 MG chewable tablet Chew 1 tablet (200 mg of elemental calcium total) by mouth 3 (three) times daily before meals.     carvedilol (COREG) 3.125 MG tablet Hold until followup with outpatient provider due to intermittent low blood pressure.     clopidogrel (PLAVIX) 75 MG tablet Take 1 tablet (75 mg total) by mouth daily. 30 tablet 2   feeding supplement (ENSURE ENLIVE / ENSURE PLUS) LIQD Take 237 mLs by mouth 3 (three) times daily between meals. 237 mL 12   hydrochlorothiazide (HYDRODIURIL) 25 MG tablet Hold until followup with outpatient provider due to intermittent low blood pressure.     insulin glargine (LANTUS SOLOSTAR) 100 UNIT/ML Solostar Pen Inject 25 Units into the skin daily.     Multiple Vitamins-Minerals (CENTRUM SILVER 50+WOMEN PO) Take 1 tablet by mouth daily.     nutrition supplement, JUVEN, (JUVEN) PACK Take 1 packet by mouth 2 (two) times daily between meals. 60 each 0   Nystatin (GERHARDT'S BUTT CREAM) CREA Apply 1 Application topically 3 (three) times  daily. Clean sacrum/buttocks with soap and water, apply Gerhardts Butt Cream three times daily and prn soiling. 1 each 1   nystatin cream (MYCOSTATIN) Apply 1 Application topically 2 (two) times daily.     pantoprazole (PROTONIX) 40 MG tablet Take 1  tablet (40 mg total) by mouth daily. 30 tablet 0   tiotropium (SPIRIVA) 18 MCG inhalation capsule Place 1 capsule into inhaler and inhale daily.     traMADol (ULTRAM) 50 MG tablet Take 50 mg by mouth every 6 (six) hours as needed for severe pain or moderate pain.     Vitamin D, Ergocalciferol, (DRISDOL) 1.25 MG (50000 UNIT) CAPS capsule Take 50,000 Units by mouth once a week.     No current facility-administered medications on file prior to visit.    There are no Patient Instructions on file for this visit. No follow-ups on file.   Georgiana Spinner, NP

## 2022-05-28 NOTE — Telephone Encounter (Signed)
Spoke with the patient and she is scheduled with Dr. 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 Pre-op appt is on 06/01/22 at 2:00 pm at the [facility]. Pre-surgical instructions were discussed and will be sent to Mychart and mailed.

## 2022-05-31 ENCOUNTER — Encounter: Payer: Medicaid Other | Attending: Physician Assistant | Admitting: Physician Assistant

## 2022-05-31 DIAGNOSIS — L97512 Non-pressure chronic ulcer of other part of right foot with fat layer exposed: Secondary | ICD-10-CM | POA: Insufficient documentation

## 2022-05-31 DIAGNOSIS — L89152 Pressure ulcer of sacral region, stage 2: Secondary | ICD-10-CM | POA: Diagnosis not present

## 2022-05-31 DIAGNOSIS — L89616 Pressure-induced deep tissue damage of right heel: Secondary | ICD-10-CM | POA: Diagnosis not present

## 2022-05-31 DIAGNOSIS — L89626 Pressure-induced deep tissue damage of left heel: Secondary | ICD-10-CM | POA: Insufficient documentation

## 2022-05-31 DIAGNOSIS — L97522 Non-pressure chronic ulcer of other part of left foot with fat layer exposed: Secondary | ICD-10-CM | POA: Diagnosis not present

## 2022-05-31 DIAGNOSIS — I5042 Chronic combined systolic (congestive) and diastolic (congestive) heart failure: Secondary | ICD-10-CM | POA: Insufficient documentation

## 2022-05-31 DIAGNOSIS — E11621 Type 2 diabetes mellitus with foot ulcer: Secondary | ICD-10-CM | POA: Insufficient documentation

## 2022-05-31 DIAGNOSIS — E1151 Type 2 diabetes mellitus with diabetic peripheral angiopathy without gangrene: Secondary | ICD-10-CM | POA: Diagnosis not present

## 2022-05-31 DIAGNOSIS — I110 Hypertensive heart disease with heart failure: Secondary | ICD-10-CM | POA: Insufficient documentation

## 2022-05-31 LAB — VAS US ABI WITH/WO TBI
Left ABI: 1.5
Right ABI: 1.49

## 2022-05-31 NOTE — Progress Notes (Signed)
Gene Autry, Beth Carroll ([id number]) [id number] Page 1 of 8 Visit Report for 05/31/2022 Chief Complaint Document Details Patient Name: Date of Service: Beth Carroll, Beth Carroll [facility] 05/31/2022 12:00 PM Medical Record Number: [id number] Patient Account Number: [id number] Date of Birth/Sex: Treating RN: 1970-06-16 (52 y.o. [name] Primary Care Provider: Marcelino Duster Other Clinician: Referring Provider: Treating Provider/Extender: Joylene Grapes in Treatment: 3 Information Obtained from: Patient Chief Complaint Bilateral LE Ulcers Electronic Signature(s) Signed: 05/31/2022 12:44:23 PM By: Allen Derry PA-C Entered By: Allen Derry on 05/31/2022 12:44:23 -------------------------------------------------------------------------------- HPI Details Patient Name: Date of Service: Beth Carroll, Beth Carroll [facility] 05/31/2022 12:00 PM Medical Record Number: [id number] Patient Account Number: [id number] Date of Birth/Sex: Treating RN: 12/14/1970 (52 y.o. [name] Primary Care Provider: Marcelino Duster Other Clinician: Referring Provider: Treating Provider/Extender: Joylene Grapes in Treatment: 3 History of Present Illness HPI Description: 05-07-2022 upon evaluation today patient appears to be doing somewhat poorly in regard to her right foot in general. She subsequently also has a wound on the sacral area but this is a very superficial stage II pressure ulcer. Nonetheless the foot in particular although I could not get in epic I gleaned is much as I could from her story of what exactly happened here. T backtrack it does appear that she was in the hospital for diabetic ketoacidosis and this o was back in March 2024. With that being said during the time that she was in the hospital she also apparently had an issue with having a thrombus in the right leg in the ankle/foot region which ended up with her having essentially complete loss of blood flow to her foot  where it was turning purple and discoloring rapidly. She ended up having a nurse evaluate this and ended up with a stat angiography to go in and find what was going on they were able to identify the clot clear this out and subsequently restore blood flow. However this was not without already having damage to the distal portion of her toes. With that being said the good news is she seems to have good blood flow at the moment I was able to recheck and that we were able to get a good pulse with Doppler. With that being said I think that a lot of the injury to her toes is more of a degloving type injury at this point which hopefully will heal the first and second toes are the main ones that are of concern in my opinion the others I think just have blisters which will come off easily and should heal quite readily. The patient tells me that her blood sugars are still elevated but not as bad as what they have been previous. Again she has had multiple instances of having fairly significant DKA based on what I am hearing. Patient does have a history of peripheral vascular disease of the right lower extremity, stage II pressure ulcer to the sacrum that occurred while she was in the hospital, diabetes mellitus type 2, congestive heart failure, and hypertension. Her most recent hemoglobin A1c was 9.8 that was on 04-23-2022 05-13-2022 upon evaluation today patient appears to be doing decently well with regard to her wounds in general. With that being said it is the great toe and the second toe and the most concerned I am not certain that the tip of the great toe survive. Nonetheless I am good I have a look at what we can see today. 05-21-2022 upon evaluation today patient appears to be doing  well currently in regard to her wounds all things considered. Fortunately I do not see any signs of active infection locally nor systemically which is great news but at the same time I do believe that she is making progress but  just doing so fairly slowly. She still seems to have good arterial flow at this point. 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 I think she could be a hyperbaric candidate considering that she does have diabetes and again were not seeing a lot of significant improvement here. I think this is something that we should at least contemplate and look into it may be something that she would be interested in but I do not know for sure having to discuss it with her next week whenever she comes in for evaluation. 05-31-2022 upon evaluation today patient appears to be doing well currently in regard to her heel and the remainder of her foot minus the first and second toe of her right foot. She did see Dr. Wyn Quaker there could be looking at amputation of the great toe as well as the distal portion of the second toe again and these did not survive following the thrombotic event. The good news is she seems to be doing much better and has excellent flow now to be able to heal this surgery. Electronic Signature(s) Signed: 05/31/2022 5:59:45 PM By: Allen Derry PA-C Entered By: Allen Derry on 05/31/2022 17:59:45 CHEM CAUT GRANULATION TISS Details -------------------------------------------------------------------------------- Beth Carroll ([id number]) [id number] Page 2 of 8 Patient Name: Date of Service: Beth Carroll, Beth Carroll [facility] 05/31/2022 12:00 PM Medical Record Number: [id number] Patient Account Number: [id number] Date of Birth/Sex: Treating RN: 10/24/1970 (52 y.o. [name] Primary Care Provider: Marcelino Duster Other Clinician: Referring Provider: Treating Provider/Extender: Joylene Grapes in Treatment: 3 Procedure Performed for: Wound #5 Right Calcaneus Performed By: Physician Allen Derry, PA-C Post Procedure Diagnosis Same as Pre-procedure Notes silver nitrate used, 1 stick used Electronic Signature(s) Signed: 05/31/2022 4:34:24 PM By:  Angelina Pih Entered By: Angelina Pih on 05/31/2022 12:50:18 -------------------------------------------------------------------------------- Physical Exam Details Patient Name: Date of Service: Beth Carroll, Beth Carroll 05/31/2022 12:00 PM Medical Record Number: [id number] Patient Account Number: [id number] Date of Birth/Sex: Treating RN: Aug 14, 1970 (52 y.o. [name] Primary Care Provider: Marcelino Duster Other Clinician: Referring Provider: Treating Provider/Extender: Joylene Grapes in Treatment: 3 Constitutional Well-nourished and well-hydrated in no acute distress. Respiratory normal breathing without difficulty. Psychiatric this patient is able to make decisions and demonstrates good insight into disease process. Alert and Oriented x 3. pleasant and cooperative. Notes Upon inspection patient's wound bed actually showed signs of good granulation epithelization in general everything is doing great the heel is making progress is a little hyper granulated I did perform some chemical cauterization with silver nitrate and we can actually switch to [facility] try to help in this regard. Electronic Signature(s) Signed: 05/31/2022 6:00:01 PM By: Allen Derry PA-C Entered By: Allen Derry on 05/31/2022 18:00:01 -------------------------------------------------------------------------------- Physician Orders Details Patient Name: Date of Service: Beth Carroll, Beth Carroll [facility] 05/31/2022 12:00 PM Medical Record Number: [id number] Patient Account Number: [id number] Date of Birth/Sex: Treating RN: 08/19/1970 (52 y.o. [name] Primary Care Provider: Marcelino Duster Other Clinician: Referring Provider: Treating Provider/Extender: Joylene Grapes in Treatment: 3 Verbal / Phone Orders: No Diagnosis Coding ICD-10 Coding Code Description I73.89 Other specified peripheral vascular diseases L97.512 Non-pressure chronic ulcer of other part of right foot with fat  layer exposed L97.522 Non-pressure chronic ulcer  of other part of left foot with fat layer exposed L89.152 Pressure ulcer of sacral region, stage 2 Palmer, Beth Carroll ([id number]) [id number] Page 3 of 8 L89.616 Pressure-induced deep tissue damage of right heel L89.626 Pressure-induced deep tissue damage of left heel 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 Nurse Visit as needed Home Health Home Health Company: - [facility] for wound care. May utilize formulary equivalent dressing for wound treatment orders unless otherwise specified. Home Health Nurse may visit PRN to address patients wound care needs. Scheduled days for dressing changes to be completed; exception, patient has scheduled wound care visit that day. **Please direct any NON-WOUND related issues/requests for orders to patient's Primary Care Physician. **If current dressing causes regression in wound condition, may D/C ordered dressing product/s and apply Normal Saline Moist Dressing daily until next Wound Healing Center or Other MD appointment. **Notify Wound Healing Center of regression in wound condition at [phone]. Bathing/ [employer] wounds with antibacterial soap and water. - dial soap recommended May shower; gently cleanse wound with antibacterial soap, rinse and pat dry prior to dressing wounds No tub bath. Anesthetic (Use 'Patient Medications' Section for Anesthetic Order Entry) Lidocaine applied to wound bed Edema Control - Lymphedema / Segmental Compressive Device / Other Elevate, Exercise Daily and A void Standing for Long Periods of Time. Elevate legs to the level of the heart and pump ankles as often as possible Elevate leg(s) parallel to the floor when sitting. DO YOUR  BEST to sleep in the bed at night. DO NOT sleep in your recliner. Long hours of sitting in a recliner leads to swelling of the legs and/or potential wounds on your backside. Off-Loading Turn and reposition every 2 hours - keep pressure off sacrum Wound Treatment Wound #1 - T Second oe Wound Laterality: Right Cleanser: Soap and Water 1 x Per Day/30 Days Discharge Instructions: Gently cleanse wound with antibacterial soap, rinse and pat dry prior to dressing wounds Topical: Betadine 1 x Per Day/30 Days Discharge Instructions: Apply betadine as directed. Prim Dressing: Gauze 1 x Per Day/30 Days ary Discharge Instructions: As directed: dry Secondary Dressing: ABD Pad 5x9 (in/in) 1 x Per Day/30 Days Discharge Instructions: Cover with ABD pad Secured With: Medipore T - 12M Medipore Beth Carroll Soft Cloth Surgical T ape ape, 2x2 (in/yd) 1 x Per Day/30 Days Secured With: [employer] Sterile or Non-Sterile 6-ply 4.5x4 (yd/yd) 1 x Per Day/30 Days Discharge Instructions: Apply Kerlix as directed Wound #2 - T Great oe Wound Laterality: Right Cleanser: Soap and Water 1 x Per Day/30 Days Discharge Instructions: Gently cleanse wound with antibacterial soap, rinse and pat dry prior to dressing wounds Topical: Betadine 1 x Per Day/30 Days Discharge Instructions: Apply betadine as directed. Prim Dressing: Gauze 1 x Per Day/30 Days ary Discharge Instructions: As directed: dry Secondary Dressing: ABD Pad 5x9 (in/in) 1 x Per Day/30 Days Discharge Instructions: Cover with ABD pad Secured With: Medipore T - 12M Medipore Beth Carroll Soft Cloth Surgical T ape ape, 2x2 (in/yd) 1 x Per Day/30 Days Secured With: [employer] Sterile or Non-Sterile 6-ply 4.5x4 (yd/yd) 1 x Per Day/30 Days Discharge Instructions: Apply Kerlix as directed Wound #5 - Calcaneus Wound Laterality: Right Cleanser: Soap and Water 3 x Per Week/30 Days Discharge Instructions: Gently cleanse wound with antibacterial soap, rinse and pat dry prior to dressing  wounds Beth Carroll, Beth Carroll ([id number])  [id number] Page 4 of 8 Prim Dressing: Hydrofera Blue Ready Transfer Foam, 2.5x2.5 (in/in) 3 x Per Week/30 Days ary Discharge Instructions: Apply Hydrofera Blue Ready to wound bed as directed Secondary Dressing: ABD Pad 5x9 (in/in) 3 x Per Week/30 Days Discharge Instructions: Cover with ABD pad Secured With: Medipore T - 61M Medipore Beth Carroll Soft Cloth Surgical T ape ape, 2x2 (in/yd) 3 x Per Week/30 Days Secured With: [employer] or Non-Sterile 6-ply 4.5x4 (yd/yd) 3 x Per Week/30 Days Discharge Instructions: Apply Kerlix as directed Electronic Signature(s) Signed: 05/31/2022 4:34:24 PM By: Angelina Pih Signed: 05/31/2022 6:09:56 PM By: Allen Derry PA-C Entered By: Angelina Pih on 05/31/2022 13:03:07 -------------------------------------------------------------------------------- Problem List Details Patient Name: Date of Service: TALECIA, GREUNKE [facility] 05/31/2022 12:00 PM Medical Record Number: [id number] Patient Account Number: [id number] Date of Birth/Sex: Treating RN: February 13, 1970 (52 y.o. [name] Primary Care Provider: Marcelino Duster Other Clinician: Referring Provider: Treating Provider/Extender: Joylene Grapes in Treatment: 3 Active Problems ICD-10 Encounter Code Description Active Date MDM Diagnosis I73.89 Other specified peripheral vascular diseases 05/06/2022 No Yes L97.512 Non-pressure chronic ulcer of other part of right foot with fat layer exposed 05/06/2022 No Yes L97.522 Non-pressure chronic ulcer of other part of left foot with fat layer exposed 05/06/2022 No Yes L89.152 Pressure ulcer of sacral region, stage 2 05/06/2022 No Yes L89.616 Pressure-induced deep tissue damage of right heel 05/06/2022 No Yes L89.626 Pressure-induced deep tissue damage of left heel 05/06/2022 No Yes E11.621 Type 2 diabetes mellitus with foot ulcer 05/06/2022 No Yes I50.42 Chronic combined systolic (congestive)  and diastolic (congestive) heart failure 05/06/2022 No Yes I10 Essential (primary) hypertension 05/06/2022 No Yes Inactive Problems Beth Carroll, Beth Carroll ([id number]) [id number] Page 5 of 8 Resolved Problems Electronic Signature(s) Signed: 05/31/2022 12:44:19 PM By: Allen Derry PA-C Entered By: Allen Derry on 05/31/2022 12:44:19 -------------------------------------------------------------------------------- Progress Note Details Patient Name: Date of Service: Beth Carroll, Beth Carroll [facility] 05/31/2022 12:00 PM Medical Record Number: [id number] Patient Account Number: [id number] Date of Birth/Sex: Treating RN: 1970-05-12 (52 y.o. [name] Primary Care Provider: Marcelino Duster Other Clinician: Referring Provider: Treating Provider/Extender: Joylene Grapes in Treatment: 3 Subjective Chief Complaint Information obtained from Patient Bilateral LE Ulcers History of Present Illness (HPI) 05-07-2022 upon evaluation today patient appears to be doing somewhat poorly in regard to her right foot in general. She subsequently also has a wound on the sacral area but this is a very superficial stage II pressure ulcer. Nonetheless the foot in particular although I could not get in epic I gleaned is much as I could from her story of what exactly happened here. T backtrack it does appear that she was in the hospital for diabetic ketoacidosis and this was back in March o 2024. 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 She ended up having a nurse evaluate this and ended up with a stat angiography to go in and find what was going on they were able to identify the clot clear this out and subsequently restore blood flow. However  this was not without already having damage to the distal portion of her toes. With that being said the good news is she seems to have good blood flow at the moment I was able  to recheck and that we were able to get a good pulse with Doppler. With that being said I think that a lot of the injury to her toes is more of a degloving type injury at this point which hopefully will heal the first and second toes are the main ones that are of concern in my opinion the others I think just have blisters which will come off easily and should heal quite readily. The patient tells me that her blood sugars are still elevated but not as bad as what they have been previous. Again she has had multiple instances of having fairly significant DKA based on what I am hearing. Patient does have a history of peripheral vascular disease of the right lower extremity, stage II pressure ulcer to the sacrum that occurred while she was in the hospital, diabetes mellitus type 2, congestive heart failure, and hypertension. Her most recent hemoglobin A1c was 9.8 that was on 04-23-2022 05-13-2022 upon evaluation today patient appears to be doing decently well with regard to her wounds in general. With that being said it is the great toe and the second toe and the most concerned I am not certain that the tip of the great toe survive. Nonetheless I am good I have a look at what we can see today. 05-21-2022 upon evaluation today patient appears to be doing well currently in regard to her wounds all things considered. Fortunately I do not see any signs of active infection locally nor systemically which is great news but at the same time I do believe that she is making progress but just doing so fairly slowly. She still seems to have good arterial flow at this point. 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 I think she could be a hyperbaric candidate considering that she does have diabetes and again were not seeing a  lot of significant improvement here. I think this is something that we should at least contemplate and look into it may be something that she would be interested in but I do not know for sure having to discuss it with her next week whenever she comes in for evaluation. 05-31-2022 upon evaluation today patient appears to be doing well currently in regard to her heel and the remainder of her foot minus the first and second toe of her right foot. She did see Dr. Wyn Quaker there could be looking at amputation of the great toe as well as the distal portion of the second toe again and these did not survive following the thrombotic event. The good news is she seems to be doing much better and has excellent flow now to be able to heal this surgery. Objective Constitutional Well-nourished and well-hydrated in no acute distress. Vitals Time Taken: 12:14 PM, Height: 66 in, Weight: 118 lbs, BMI: 19, Temperature: 98 F, Pulse: 74 bpm, Respiratory Rate: 18 breaths/min, Blood Pressure: 113/75 mmHg. Respiratory normal breathing without difficulty. Psychiatric this patient is able to make decisions and demonstrates good insight into disease process. Alert and Oriented x 3. pleasant and cooperative. General Notes: Upon inspection patient's wound bed actually showed signs of good granulation epithelization in general everything is doing great the heel is making progress is a little hyper granulated I did perform some chemical cauterization with silver nitrate and we can actually switch to [facility] try to help in this regard. Beth Carroll, Beth Carroll ([id number]) [id number] Page 6 of 8 Integumentary (Hair, Skin) Wound #1 status is Open. Original cause of wound  was Gradually Appeared. The date acquired was: 04/21/2022. The wound has been in treatment 3 weeks. The wound is located on the Right T Second. The wound measures 1cm length x 1.5cm width x 0.1cm depth; 1.178cm^2 area and 0.118cm^3 volume.  There is no oe tunneling or undermining noted. There is a medium amount of serosanguineous drainage noted. There is no granulation within the wound bed. There is a large (67-100%) amount of necrotic tissue within the wound bed including Eschar. Wound #2 status is Open. Original cause of wound was Gradually Appeared. The date acquired was: 04/21/2022. The wound has been in treatment 3 weeks. The wound is located on the Right T Great. The wound measures 3.5cm length x 9cm width x 0.1cm depth; 24.74cm^2 area and 2.474cm^3 volume. There is no oe tunneling or undermining noted. There is a none present amount of drainage noted. There is no granulation within the wound bed. There is a large (67-100%) amount of necrotic tissue within the wound bed including Eschar. Wound #5 status is Open. Original cause of wound was Gradually Appeared. The date acquired was: 04/21/2022. The wound has been in treatment 3 weeks. The wound is located on the Right Calcaneus. The wound measures 3.2cm length x 4cm width x 0.1cm depth; 10.053cm^2 area and 1.005cm^3 volume. There is Fat Layer (Subcutaneous Tissue) exposed. There is no tunneling or undermining noted. There is a medium amount of serosanguineous drainage noted. There is medium (34-66%) red, pink, hyper - granulation within the wound bed. There is a medium (34-66%) amount of necrotic tissue within the wound bed including Adherent Slough. Wound #6 status is Healed - Epithelialized. Original cause of wound was Gradually Appeared. The date acquired was: 04/21/2022. The wound has been in treatment 3 weeks. The wound is located on the Right T Third. The wound measures 0cm length x 0cm width x 0cm depth; 0cm^2 area and 0cm^3 volume. oe There is no tunneling or undermining noted. There is a none present amount of drainage noted. There is no granulation within the wound bed. There is no necrotic tissue within the wound bed. 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 An CHEM CAUT GRANULATION TISS procedure was performed by Allen Derry, PA-C. Post procedure Diagnosis Wound #5: Same as Pre-Procedure Notes: silver nitrate used, 1 stick used Plan Follow-up Appointments: Return Appointment in 1 week. Nurse Visit as needed Home Health: Home Health Company: - [facility] for wound care. May utilize formulary equivalent dressing for wound treatment orders unless otherwise specified. Home Health Nurse may visit PRN to address patientoos wound care needs. Scheduled days for dressing changes to be completed; exception, patient has scheduled wound care visit that day. **Please direct any NON-WOUND related issues/requests for orders to patient's Primary Care Physician. **If current dressing causes regression in wound condition, may D/C ordered dressing product/s and apply Normal Saline Moist Dressing daily until next Wound Healing Center or Other MD appointment. **Notify Wound Healing Center of regression in wound condition at [phone].  Bathing/ Shower/ Hygiene: Wash wounds with antibacterial soap and water. - dial soap recommended May shower; gently cleanse wound with antibacterial soap, rinse and pat dry prior to dressing wounds No tub bath. Anesthetic (Use 'Patient Medications' Section for Anesthetic Order Entry): Lidocaine applied to wound  bed Edema Control - Lymphedema / Segmental Compressive Device / Other: Elevate, Exercise Daily and Avoid Standing for Long Periods of Time. Elevate legs to the level of the heart and pump ankles as often as possible Elevate leg(s) parallel to the floor when sitting. DO YOUR BEST to sleep in the bed at night. DO NOT sleep in your recliner. Long hours of sitting in a recliner leads to swelling of the legs and/or potential wounds on your backside. Off-Loading: Turn and reposition every 2 hours - keep pressure off sacrum WOUND #1: - T Second Wound Laterality: Right oe Cleanser: Soap and Water 1 x Per Day/30 Days Discharge Instructions: Gently cleanse wound with antibacterial soap, rinse and pat dry prior to dressing wounds Topical: Betadine 1 x Per Day/30 Days Beth Carroll, Beth Carroll ([id number]) [id number] Page 7 of 8 Discharge Instructions: Apply betadine as directed. Prim Dressing: Gauze 1 x Per Day/30 Days ary Discharge Instructions: As directed: dry Secondary Dressing: ABD Pad 5x9 (in/in) 1 x Per Day/30 Days Discharge Instructions: Cover with ABD pad Secured With: Medipore T - 37M Medipore Beth Carroll Soft Cloth Surgical T ape ape, 2x2 (in/yd) 1 x Per Day/30 Days Secured With: [employer] Sterile or Non-Sterile 6-ply 4.5x4 (yd/yd) 1 x Per Day/30 Days Discharge Instructions: Apply Kerlix as directed WOUND #2: - T Great Wound Laterality: Right oe Cleanser: Soap and Water 1 x Per Day/30 Days Discharge Instructions: Gently cleanse wound with antibacterial soap, rinse and pat dry prior to dressing wounds Topical: Betadine 1 x Per Day/30 Days Discharge Instructions: Apply betadine as directed. Prim Dressing: Gauze 1 x Per Day/30 Days ary Discharge Instructions: As directed: dry Secondary Dressing: ABD Pad 5x9 (in/in) 1 x Per Day/30 Days Discharge Instructions: Cover with ABD pad Secured With: Medipore T - 37M Medipore Beth Carroll Soft Cloth Surgical T ape ape, 2x2 (in/yd) 1 x Per Day/30  Days Secured With: [employer] Sterile or Non-Sterile 6-ply 4.5x4 (yd/yd) 1 x Per Day/30 Days Discharge Instructions: Apply Kerlix as directed WOUND #5: - Calcaneus Wound Laterality: Right Cleanser: Soap and Water 3 x Per Week/30 Days Discharge Instructions: Gently cleanse wound with antibacterial soap, rinse and pat dry prior to dressing wounds Prim Dressing: Hydrofera Blue Ready Transfer Foam, 2.5x2.5 (in/in) 3 x Per Week/30 Days ary Discharge Instructions: Apply Hydrofera Blue Ready to wound bed as directed Secondary Dressing: ABD Pad 5x9 (in/in) 3 x Per Week/30 Days Discharge Instructions: Cover with ABD pad Secured With: Medipore T - 37M Medipore Beth Carroll Soft Cloth Surgical T ape ape, 2x2 (in/yd) 3 x Per Week/30 Days Secured With: [employer] Sterile or Non-Sterile 6-ply 4.5x4 (yd/yd) 3 x Per Week/30 Days Discharge Instructions: Apply Kerlix as directed 1. I am good recommend that we go ahead and switch to the [facility] for the heel that should do quite well. 2. I am good recommend as well that the patient should proceed with the recommendation with Dr. Wyn Quaker for amputation of the great toe and second toe distal portion as these did not survive following the thrombotic event nonetheless I think she should do well with the surgery. We will see patient back for reevaluation in 1 week here in the clinic. If anything worsens or changes patient will contact our office  for additional recommendations. Electronic Signature(s) Signed: 05/31/2022 6:00:23 PM By: Allen Derry PA-C Entered By: Allen Derry on 05/31/2022 18:00:23 -------------------------------------------------------------------------------- SuperBill Details Patient Name: Date of Service: DENYLA, TAM [facility] 05/31/2022 Medical Record Number: [id number] Patient Account Number: [id number] Date of Birth/Sex: Treating RN: 1970-05-22 (52 y.o. 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 I10 Essential (primary) hypertension Facility Procedures : Beth Carroll, HARTZ Code: [id number] Danese ([id number]) Description: 17250 - CHEM CAUT GRANULATION TISS ICD-10 Diagnosis Description L97.512 Non-pressure chronic ulcer of other part of right foot with fat layer exposed [phone] Modifier: [id number] Pa Quantity: 1 ge 8 of 8 Physician Procedures : CPT4 Code Description Modifier [id number] 17250 - WC PHYS CHEM CAUT GRAN TISSUE ICD-10 Diagnosis Description L97.512 Non-pressure chronic ulcer of other part of right foot with fat layer exposed Quantity: 1 Electronic Signature(s) Signed: 05/31/2022 6:00:56 PM By: Allen Derry PA-C Entered By: Allen Derry on 05/31/2022 18:00:55

## 2022-05-31 NOTE — Progress Notes (Signed)
Beth Carroll, Beth Carroll ([id number]) [id number] Page 1 of 11 Visit Report for 05/31/2022 Arrival Information Details Patient Name: Date of Service: Beth Carroll, Beth Carroll [facility] 05/31/2022 12:00 PM Medical Record Number: [id number] Patient Account Number: [id number] Date of Birth/Sex: Treating RN: 1970-02-23 (52 y.o. [name] Primary Care Majesty Oehlert: Marcelino Duster Other Clinician: Referring Zyaire Mccleod: Treating Giles Currie/Extender: Joylene Grapes in Treatment: 3 Visit Information History Since Last Visit Added or deleted any medications: No Patient Arrived: Wheel Chair Any new allergies or adverse reactions: No Arrival Time: 12:09 Had a fall or experienced change in No Accompanied By: spouse activities of daily living that may affect Transfer Assistance: Transfer Board risk of falls: Patient Identification Verified: Yes Hospitalized since last visit: No Secondary Verification Process Completed: Yes Has Dressing in Place as Prescribed: Yes Patient Has Alerts: Yes Pain Present Now: No Patient Alerts: Patient on Blood Thinner type 2 diabetic ABI 02/02/22 L 1.12 ABI 02/02/22 R 1.17 TBI 02/02/22 L 0.93 TBI 02/02/22 R 0.90 Electronic Signature(s) Signed: 05/31/2022 4:34:24 PM By: Angelina Pih Entered By: Angelina Pih on 05/31/2022 12:14:24 -------------------------------------------------------------------------------- Clinic Level of Care Assessment Details Patient Name: Date of Service: Beth Carroll, Beth Carroll 05/31/2022 12:00 PM Medical Record Number: [id number] Patient Account Number: [id number] Date of Birth/Sex: Treating RN: [birth year] (52 y.o. [name] Primary Care Geraldo Haris: Marcelino Duster Other Clinician: Referring Synethia Endicott: Treating Prudence Heiny/Extender: Joylene Grapes in Treatment: 3 Clinic Level of Care Assessment Items TOOL 1 Quantity Score []  - 0 Use when EandM and Procedure is performed on INITIAL visit ASSESSMENTS - Nursing  Assessment / Reassessment []  - 0 General Physical Exam (combine w/ comprehensive assessment (listed just below) when performed on new pt. evals) []  - 0 Comprehensive Assessment (HX, ROS, Risk Assessments, Wounds Hx, etc.) ASSESSMENTS - Wound and Skin Assessment / Reassessment []  - 0 Dermatologic / Skin Assessment (not related to wound area) ASSESSMENTS - Ostomy and/or Continence Assessment and Care []  - 0 Incontinence Assessment and Management []  - 0 Ostomy Care Assessment and Management (repouching, etc.) PROCESS - Coordination of Care []  - 0 Simple Patient / Family Education for ongoing care []  - 0 Complex (extensive) Patient / Family Education for ongoing care []  - 0 Staff obtains Chiropractor, Records, T Results / Process Orders est []  - 0 Staff telephones HHA, Nursing Homes / Clarify orders / etc []  - 0 Routine Transfer to another Facility (non-emergent condition) []  - 0 Routine Hospital Admission (non-emergent condition) []  - 0 New Admissions / Insurance Authorizations / Ordering NPWT Apligraf, etcLarken, Sm[place], [place] ([id number]) [id number] Page 2 of 11 []  - 0 Emergency Hospital Admission (emergent condition) PROCESS - Special Needs []  - 0 Pediatric / Minor Patient Management []  - 0 Isolation Patient Management []  - 0 Hearing / Language / Visual special needs []  - 0 Assessment of Community assistance (transportation, D/C planning, etc.) []  - 0 Additional assistance / Altered mentation []  - 0 Support Surface(s) Assessment (bed, cushion, seat, etc.) INTERVENTIONS - Miscellaneous []  - 0 External ear exam []  - 0 Patient Transfer (multiple staff / Nurse, adult / Similar devices) []  - 0 Simple Staple / Suture removal (25 or less) []  - 0 Complex Staple / Suture removal (26 or more) []  - 0 Hypo/Hyperglycemic Management (do not check if billed separately) []  - 0 Ankle / Brachial Index (ABI) - do not check if billed separately Has the patient  been seen at the hospital within the last three years: Yes Total Score: 0 Level Of Care: ____ Electronic Signature(s)  Signed: 05/31/2022 4:34:24 PM By: Angelina Pih Entered By: Angelina Pih on 05/31/2022 13:50:14 -------------------------------------------------------------------------------- Encounter Discharge Information Details Patient Name: Date of Service: Beth Carroll, Beth Carroll [facility] 05/31/2022 12:00 PM Medical Record Number: [id number] Patient Account Number: [id number] Date of Birth/Sex: Treating RN: [birth year] (52 y.o. [name] Primary Care Lily Kernen: Marcelino Duster Other Clinician: Referring Corayma Cashatt: Treating Add Dinapoli/Extender: Joylene Grapes in Treatment: 3 Encounter Discharge Information Items Discharge Condition: Stable Ambulatory Status: Wheelchair Discharge Destination: Home Transportation: Private Auto Accompanied By: spouse Schedule Follow-up Appointment: Yes Clinical Summary of Care: Electronic Signature(s) Signed: 05/31/2022 1:51:25 PM By: Angelina Pih Entered By: Angelina Pih on 05/31/2022 13:51:25 -------------------------------------------------------------------------------- Lower Extremity Assessment Details Patient Name: Date of Service: Beth Carroll, Beth Carroll 05/31/2022 12:00 PM Medical Record Number: [id number] Patient Account Number: [id number] Date of Birth/Sex: Treating RN: 1970-05-25 (52 y.o. [name] Primary Care Leeanna Slaby: Marcelino Duster Other Clinician: Referring Ashlyne Olenick: Treating Dezarai Prew/Extender: Joylene Grapes in Treatment: 3 Edema Assessment Assessed: [Left: No] [Right: No] Edema: [Left: Ye] [Right: s] W[LeftLouanne Belton ([id number])] [Right: [id number] Page 3 of 11] Calf Left: Right: Point of Measurement: 30 cm From Medial Instep 33.3 cm Ankle Left: Right: Point of Measurement: 10 cm From Medial Instep 25.8 cm Vascular Assessment Pulses: Dorsalis  Pedis Palpable: [Right:Yes] Posterior Tibial Palpable: [Right:Yes] Electronic Signature(s) Signed: 05/31/2022 4:34:24 PM By: Angelina Pih Entered By: Angelina Pih on 05/31/2022 12:30:28 -------------------------------------------------------------------------------- Multi Wound Chart Details Patient Name: Date of Service: Beth Carroll, Beth Carroll [facility] 05/31/2022 12:00 PM Medical Record Number: [id number] Patient Account Number: [id number] Date of Birth/Sex: Treating RN: [birth year] (52 y.o. 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 Etiology: Congestive Heart Failure, Congestive Heart Failure, Congestive Heart Failure, Comorbid History: Hypertension, Peripheral Arterial Hypertension, Peripheral Arterial Hypertension, Peripheral Arterial Disease, Type II Diabetes Disease, Type II Diabetes Disease, Type II Diabetes 04/21/2022 04/21/2022 04/21/2022 Date Acquired: 3 3 3  Weeks of Treatment: Open Open Open Wound Status: No No No Wound Recurrence: Yes Yes No Pending A mputation on Presentation: 1x1.5x0.1 3.5x9x0.1 3.2x4x0.1 Measurements L x W x D (cm) 1.178 24.74 10.053 A (cm) : rea 0.118 2.474 1.005 Volume (cm) : 55.90% -309150.00% 57.30% % Reduction in A rea: 55.80% -247300.00% 57.30% % Reduction in Volume: Unclassifiable Unclassifiable Full Thickness Without Exposed Classification: Support Structures Medium None Present Medium Exudate  Amount: Serosanguineous N/A Serosanguineous Exudate Type: red, brown N/A red, brown Exudate Color: None Present (0%) None Present (0%) Medium (34-66%) Granulation Amount: N/A N/A Red, Pink, Hyper-granulation Granulation QualityRoxine Heidbrink, Beth Carroll ([id number]) [id number] Page 4 of 11 Large (67-100%) Large (67-100%) Medium (34-66%) Necrotic A mount: Eschar Eschar Adherent Slough Necrotic Tissue: None None None Epithelialization: Wound Number: 6 N/A N/A Photos: N/A N/A Right T Third oe N/A N/A Wound Location: Gradually Appeared N/A N/A Wounding Event: Arterial Insufficiency Ulcer N/A N/A Primary Etiology: Congestive Heart Failure, N/A N/A Comorbid History: Hypertension, Peripheral Arterial Disease, Type II Diabetes 04/21/2022 N/A N/A Date Acquired: 3 N/A N/A Weeks of Treatment: Open N/A N/A Wound Status: No N/A N/A Wound Recurrence: Yes N/A N/A Pending A mputation on Presentation: 0.4x0.6x0.1 N/A N/A Measurements L x W x D (cm)  0.188 N/A N/A A (cm) : rea 0.019 N/A N/A Volume (cm) : 93.20% N/A N/A % Reduction in A rea: 93.10% N/A N/A % Reduction in Volume: Unclassifiable N/A N/A Classification: Medium N/A N/A Exudate A mount: Serosanguineous N/A N/A Exudate Type: red, brown N/A N/A Exudate Color: None Present (0%) N/A N/A Granulation A mount: N/A N/A N/A Granulation Quality: Large (67-100%) N/A N/A Necrotic A mount: Eschar N/A N/A Necrotic Tissue: Fat Layer (Subcutaneous Tissue): No N/A N/A Exposed Structures: None N/A N/A Epithelialization: Treatment Notes Electronic Signature(s) Signed: 05/31/2022 4:34:24 PM By: Angelina Pih Entered By: Angelina Pih on 05/31/2022 12:45:50 -------------------------------------------------------------------------------- Multi-Disciplinary Care Plan Details Patient Name: Date of Service: Beth Carroll, Beth Carroll [facility] 05/31/2022 12:00 PM Medical Record Number: [id number] Patient Account Number:  [id number] Date of Birth/Sex: Treating RN: 1970/05/10 (52 y.o. [name] Primary Care Jervis Trapani: Marcelino Duster Other Clinician: Referring Jacorian Golaszewski: Treating Adon Gehlhausen/Extender: Joylene Grapes in Treatment: 3 Active Inactive Necrotic Tissue Nursing Diagnoses: Impaired tissue integrity related to necrotic/devitalized tissue Knowledge deficit related to management of necrotic/devitalized tissue Goals: Necrotic/devitalized tissue will be minimized in the wound bed Date Initiated: 05/06/2022 Target Resolution Date: 06/04/2022 Goal Status: Active Patient/caregiver will verbalize understanding of reason and process for debridement of necrotic tissue Date Initiated: 05/06/2022 Target Resolution Date: 06/04/2022 Goal Status: [place] ([id number]) [id number] Page 5 of 11 Interventions: Assess patient pain level pre-, during and post procedure and prior to discharge Provide education on necrotic tissue and debridement process Treatment Activities: Apply topical anesthetic as ordered : 05/06/2022 Notes: Wound/Skin Impairment Nursing Diagnoses: Impaired tissue integrity Knowledge deficit related to ulceration/compromised skin integrity Goals: Ulcer/skin breakdown will have a volume reduction of 30% by week 4 Date Initiated: 05/06/2022 Target Resolution Date: 06/03/2022 Goal Status: Active Ulcer/skin breakdown will have a volume reduction of 50% by week 8 Date Initiated: 05/06/2022 Target Resolution Date: 07/01/2022 Goal Status: Active Ulcer/skin breakdown will have a volume reduction of 80% by week 12 Date Initiated: 05/06/2022 Target Resolution Date: 07/29/2022 Goal Status: Active Ulcer/skin breakdown will heal within 14 weeks Date Initiated: 05/06/2022 Target Resolution Date: 08/12/2022 Goal Status: Active Interventions: Assess patient/caregiver ability to obtain necessary supplies Assess patient/caregiver ability to perform  ulcer/skin care regimen upon admission and as needed Assess ulceration(s) every visit Provide education on ulcer and skin care Treatment Activities: Skin care regimen initiated : 05/06/2022 Notes: Electronic Signature(s) Signed: 05/31/2022 1:50:35 PM By: Angelina Pih Entered By: Angelina Pih on 05/31/2022 13:50:35 -------------------------------------------------------------------------------- Pain Assessment Details Patient Name: Date of Service: Beth Carroll, Beth Carroll 05/31/2022 12:00 PM Medical Record Number: [id number] Patient Account Number: [id number] Date of Birth/Sex: Treating RN: 1970-03-22 (52 y.o. [name] Primary Care Deveon Kisiel: Marcelino Duster Other Clinician: Referring Maysel Mccolm: Treating Ayasha Ellingsen/Extender: Joylene Grapes in Treatment: 3 Active Problems Location of Pain Severity and Description of Pain Patient Has Paino No Site Locations Rate the pain. [place], Beth Carroll ([id number]) [id number] Page 6 of 11 Rate the pain. Current Pain Level: 0 Pain Management and Medication Current Pain Management: Electronic Signature(s) Signed: 05/31/2022 4:34:24 PM By: Angelina Pih Entered By: Angelina Pih on 05/31/2022 12:16:40 -------------------------------------------------------------------------------- Patient/Caregiver Education Details Patient Name: Date of Service: Beth Carroll 5/6/2024andnbsp12:00 PM Medical Record Number: [id number] Patient Account Number: [id number] Date of Birth/Gender: Treating RN: 1970/02/28 (52 y.o. [name] Primary Care Physician: Marcelino Duster Other Clinician: Referring Physician: Treating Physician/Extender: Joylene Grapes in Treatment: 3 Education Assessment Education Provided To: Patient Education Topics Provided Wound/Skin Impairment: Handouts: Caring for Your Ulcer Methods: Explain/Verbal  Responses: State content correctly Electronic  Signature(s) Signed: 05/31/2022 4:34:24 PM By: Angelina Pih Entered By: Angelina Pih on 05/31/2022 13:50:47 -------------------------------------------------------------------------------- Wound Assessment Details Patient Name: Date of Service: Beth Carroll, Beth Carroll 05/31/2022 12:00 PM Medical Record Number: [id number] Patient Account Number: [id number] Date of Birth/Sex: Treating RN: Apr 09, 1970 (52 y.o. 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 Second oe Wound Open Wounding Event: Gradually Appeared Status: Date Acquired: 04/21/2022 Comorbid Congestive Heart Failure, Hypertension, Peripheral Arterial Weeks Of Treatment: 3 [place], Beth Carroll ([id number]) [id number] Page 7 of 11 Weeks Of Treatment: 3 History: Disease, Type II Diabetes Clustered Wound: No Pending Amputation On Presentation Photos Wound Measurements Length: (cm) 1 Width: (cm) 1.5 Depth: (cm) 0.1 Area: (cm) 1.178 Volume: (cm) 0.118 % Reduction in Area: 55.9% % Reduction in Volume: 55.8% Epithelialization: None Tunneling: No Undermining: No Wound Description Classification: Unclassifiable Exudate Amount: Medium Exudate Type: Serosanguineous Exudate Color: red, brown Foul Odor After Cleansing: No Slough/Fibrino No Wound Bed Granulation Amount: None Present (0%) Necrotic Amount: Large (67-100%) Necrotic Quality: Eschar Treatment Notes Wound #1 (Toe Second) Wound Laterality: Right Cleanser Soap and Water Discharge Instruction: Gently cleanse wound with antibacterial soap, rinse and pat dry prior to dressing wounds Peri-Wound Care Topical Betadine Discharge Instruction: Apply betadine as directed. Primary Dressing Gauze Discharge Instruction: As  directed: dry Secondary Dressing ABD Pad 5x9 (in/in) Discharge Instruction: Cover with ABD pad Secured With Medipore T - 7M Medipore H Soft Cloth Surgical T ape ape, 2x2 (in/yd) Kerlix Roll Sterile or Non-Sterile 6-ply 4.5x4 (yd/yd) Discharge Instruction: Apply Kerlix as directed Compression Wrap Compression Stockings Add-Ons Electronic Signature(s) Signed: 05/31/2022 4:34:24 PM By: Angelina Pih Entered By: Angelina Pih on 05/31/2022 12:27:41 [name] ([id number]) [id number] Page 8 of 11 -------------------------------------------------------------------------------- Wound Assessment Details Patient Name: Date of Service: Beth Carroll, Beth Carroll [facility] 05/31/2022 12:00 PM Medical Record Number: [id number] Patient Account Number: [id number] Date of Birth/Sex: Treating RN: 1970-12-27 (52 y.o. 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 Great oe Wound Open Wounding Event: Gradually Appeared Status: Date Acquired: 04/21/2022 Comorbid Congestive Heart Failure, Hypertension, Peripheral Arterial Weeks Of Treatment: 3 History: Disease, Type II Diabetes Clustered Wound: No Pending Amputation On Presentation Photos Wound Measurements Length: (cm) 3.5 Width: (cm) 9 Depth: (cm) 0.1 Area: (cm) 24.74 Volume: (cm) 2.474 % Reduction in Area: -309150% % Reduction in Volume: -247300% Epithelialization: None Tunneling: No Undermining: No Wound Description Classification: Unclassifiable Exudate Amount: None Present Foul Odor After Cleansing: No Slough/Fibrino No Wound Bed Granulation Amount: None Present (0%) Necrotic Amount: Large (67-100%) Necrotic Quality: Eschar Treatment Notes Wound #2 (Toe Great) Wound Laterality:  Right Cleanser Soap and Water Discharge Instruction: Gently cleanse wound with antibacterial soap, rinse and pat dry prior to dressing wounds Peri-Wound Care Topical Betadine Discharge Instruction: Apply betadine as directed. Primary Dressing Gauze Discharge Instruction: As directed: dry Secondary Dressing ABD Pad 5x9 (in/in) Discharge Instruction: Cover with ABD pad Secured With [place], Beth Carroll ([id number]) [id number] Page 9 of 11 Medipore T - 7M Medipore H Soft Cloth Surgical T ape ape, 2x2 (in/yd) Kerlix Roll Sterile or Non-Sterile 6-ply 4.5x4 (yd/yd) Discharge Instruction: Apply Kerlix as directed Compression Wrap Compression Stockings Add-Ons Electronic Signature(s) Signed: 05/31/2022 4:34:24 PM By: Angelina Pih  Entered By: Angelina Pih on 05/31/2022 12:28:22 -------------------------------------------------------------------------------- Wound Assessment Details Patient Name: Date of Service: Beth Carroll, PIRIE [facility] 05/31/2022 12:00 PM Medical Record Number: [id number] Patient Account Number: [id number] Date of Birth/Sex: Treating RN: 07-27-1970 (52 y.o. 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 Wound Open Wounding Event: Gradually Appeared Status: Date Acquired: 04/21/2022 Comorbid Congestive Heart Failure, Hypertension, Peripheral Arterial Weeks Of Treatment: 3 History: Disease, Type II Diabetes Clustered Wound: No Photos Wound Measurements Length: (cm) 3.2 Width: (cm) 4 Depth: (cm) 0.1 Area: (cm) 10.053 Volume: (cm) 1.005 % Reduction in Area: 57.3% % Reduction in Volume: 57.3% Epithelialization: None Tunneling: No Undermining: No Wound Description Classification: Full Thickness Without Exposed  Support Structures Exudate Amount: Medium Exudate Type: Serosanguineous Exudate Color: red, brown Foul Odor After Cleansing: No Slough/Fibrino Yes Wound Bed Granulation Amount: Medium (34-66%) Exposed Structure Granulation Quality: Red, Pink, Hyper-granulation Fat Layer (Subcutaneous Tissue) Exposed: Yes Necrotic Amount: Medium (34-66%) Necrotic Quality: Adherent Slough Treatment Notes Wound #5 (Calcaneus) Wound Laterality: Right Cleanser Soap and Water Discharge Instruction: Gently cleanse wound with antibacterial soap, rinse and pat dry prior to dressing wounds [place], Beth Carroll ([id number]) [id number] Page 10 of 11 Peri-Wound Care Topical Primary Dressing Hydrofera Blue Ready Transfer Foam, 2.5x2.5 (in/in) Discharge Instruction: Apply Hydrofera Blue Ready to wound bed as directed Secondary Dressing ABD Pad 5x9 (in/in) Discharge Instruction: Cover with ABD pad Secured With Medipore T - 28M Medipore H Soft Cloth Surgical T ape ape, 2x2 (in/yd) Kerlix Roll Sterile or Non-Sterile 6-ply 4.5x4 (yd/yd) Discharge Instruction: Apply Kerlix as directed Compression Wrap Compression Stockings Add-Ons Electronic Signature(s) Signed: 05/31/2022 4:34:24 PM By: Angelina Pih Entered By: Angelina Pih on 05/31/2022 12:28:54 -------------------------------------------------------------------------------- Wound Assessment Details Patient Name: Date of Service: MAIA, [place] 05/31/2022 12:00 PM Medical Record Number: [id number] Patient Account Number: [id number] Date of Birth/Sex: Treating RN: 09-May-1970 (52 y.o. 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 Third oe Wound Healed - Epithelialized Wounding Event: Gradually  Appeared Status: Date Acquired: 04/21/2022 Comorbid Congestive Heart Failure, Hypertension, Peripheral Arterial Weeks Of Treatment: 3 History: Disease, Type II Diabetes Clustered Wound: No Pending Amputation On Presentation Photos Wound Measurements Length: (cm) Width: (cm) Depth: (cm) Area: (cm) Volume: (cm) 0 % Reduction in Area: 100% 0 % Reduction in Volume: 100% 0 Epithelialization: Large (67-100%) 0 Tunneling: No 0 Undermining: No Wound Description Classification: Unclassifiable Exudate Amount: None Present Dearmas, Bailyn ([id number]) Wound Bed Granulation Amount: None Present (0%) Necrotic Amount: None Present (0%) Foul Odor After Cleansing: No Slough/Fibrino No [id number] Page 11 of 11 Exposed Structure Fat Layer (Subcutaneous Tissue) Exposed: No Electronic Signature(s) Signed: 05/31/2022 4:34:24 PM By: Angelina Pih Entered By: Angelina Pih on 05/31/2022 12:49:28 -------------------------------------------------------------------------------- Vitals Details Patient Name: Date of Service: KAYSHIA, RUISE [facility] 05/31/2022 12:00 PM Medical Record Number: [id number] Patient Account Number: [id number] Date of Birth/Sex: Treating RN: [birth year] (52 y.o. [name] Primary Care Steen Bisig: Marcelino Duster Other Clinician: Referring Daimon Kean: Treating Oziel Beitler/Extender: Joylene Grapes in Treatment: 3 Vital Signs Time Taken: 12:14 Temperature (F): 98 Height (in): 66 Pulse (bpm): 74 Weight (lbs): 118 Respiratory Rate (breaths/min): 18 Body Mass Index (BMI): 19 Blood Pressure (mmHg): 113/75 Reference Range: 80 - 120 mg /  dl Electronic Signature(s) Signed: 05/31/2022 4:34:24 PM By: Angelina Pih Entered By: Angelina Pih on 05/31/2022 12:16:33

## 2022-06-01 ENCOUNTER — Encounter: Payer: Self-pay | Admitting: Vascular Surgery

## 2022-06-01 ENCOUNTER — Encounter
Admission: RE | Admit: 2022-06-01 | Discharge: 2022-06-01 | Disposition: A | Discharge status: Home / Self Care | Payer: Medicaid Other | Source: Ambulatory Visit | Attending: Vascular Surgery | Admitting: Vascular Surgery

## 2022-06-01 ENCOUNTER — Other Ambulatory Visit (INDEPENDENT_AMBULATORY_CARE_PROVIDER_SITE_OTHER): Payer: Self-pay | Admitting: Nurse Practitioner

## 2022-06-01 ENCOUNTER — Telehealth (INDEPENDENT_AMBULATORY_CARE_PROVIDER_SITE_OTHER): Payer: Self-pay

## 2022-06-01 ENCOUNTER — Emergency Department: Payer: Medicaid Other

## 2022-06-01 ENCOUNTER — Emergency Department
Admission: EM | Admit: 2022-06-01 | Discharge: 2022-06-01 | Disposition: A | Discharge status: Home / Self Care | Payer: Medicaid Other | Attending: Emergency Medicine | Admitting: Emergency Medicine

## 2022-06-01 DIAGNOSIS — L97419 Non-pressure chronic ulcer of right heel and midfoot with unspecified severity: Secondary | ICD-10-CM | POA: Diagnosis not present

## 2022-06-01 DIAGNOSIS — J449 Chronic obstructive pulmonary disease, unspecified: Secondary | ICD-10-CM | POA: Diagnosis not present

## 2022-06-01 DIAGNOSIS — I2510 Atherosclerotic heart disease of native coronary artery without angina pectoris: Secondary | ICD-10-CM | POA: Diagnosis not present

## 2022-06-01 DIAGNOSIS — I110 Hypertensive heart disease with heart failure: Secondary | ICD-10-CM | POA: Insufficient documentation

## 2022-06-01 DIAGNOSIS — I10 Essential (primary) hypertension: Secondary | ICD-10-CM

## 2022-06-01 DIAGNOSIS — I5043 Acute on chronic combined systolic (congestive) and diastolic (congestive) heart failure: Secondary | ICD-10-CM | POA: Diagnosis not present

## 2022-06-01 DIAGNOSIS — E10621 Type 1 diabetes mellitus with foot ulcer: Secondary | ICD-10-CM | POA: Insufficient documentation

## 2022-06-01 DIAGNOSIS — I5022 Chronic systolic (congestive) heart failure: Secondary | ICD-10-CM

## 2022-06-01 DIAGNOSIS — I96 Gangrene, not elsewhere classified: Secondary | ICD-10-CM

## 2022-06-01 DIAGNOSIS — I959 Hypotension, unspecified: Secondary | ICD-10-CM | POA: Insufficient documentation

## 2022-06-01 DIAGNOSIS — I255 Ischemic cardiomyopathy: Secondary | ICD-10-CM

## 2022-06-01 DIAGNOSIS — N390 Urinary tract infection, site not specified: Secondary | ICD-10-CM | POA: Insufficient documentation

## 2022-06-01 DIAGNOSIS — Z7901 Long term (current) use of anticoagulants: Secondary | ICD-10-CM | POA: Insufficient documentation

## 2022-06-01 DIAGNOSIS — D649 Anemia, unspecified: Secondary | ICD-10-CM

## 2022-06-01 DIAGNOSIS — E119 Type 2 diabetes mellitus without complications: Secondary | ICD-10-CM

## 2022-06-01 HISTORY — DX: Acute myocardial infarction, unspecified: I21.9

## 2022-06-01 HISTORY — DX: Benign neoplasm of meninges, unspecified: D32.9

## 2022-06-01 HISTORY — DX: Gastro-esophageal reflux disease without esophagitis: K21.9

## 2022-06-01 HISTORY — DX: Atherosclerosis of aorta: I70.0

## 2022-06-01 HISTORY — DX: Acute embolism and thrombosis of unspecified deep veins of right lower extremity: I82.401

## 2022-06-01 LAB — URINALYSIS, ROUTINE W REFLEX MICROSCOPIC
Bilirubin Urine: NEGATIVE
Glucose, UA: >=500 mg/dL — AB
Ketones, ur: NEGATIVE mg/dL
Nitrite: NEGATIVE
Protein, ur: NEGATIVE mg/dL
Specific Gravity, Urine: 1.008 (ref 1.005–1.030)
WBC, UA: >50 WBC/hpf (ref 0–5)
pH: 7 (ref 5.0–8.0)

## 2022-06-01 LAB — COMPREHENSIVE METABOLIC PANEL
ALT: 14 U/L (ref 0–44)
AST: 18 U/L (ref 15–41)
Albumin: 2.9 g/dL — ABNORMAL LOW (ref 3.5–5.0)
Alkaline Phosphatase: 93 U/L (ref 38–126)
Anion gap: 8 (ref 5–15)
BUN: 11 mg/dL (ref 6–20)
CO2: 27 mmol/L (ref 22–32)
Calcium: 8.7 mg/dL — ABNORMAL LOW (ref 8.9–10.3)
Chloride: 102 mmol/L (ref 98–111)
Creatinine, Ser: 0.43 mg/dL — ABNORMAL LOW (ref 0.44–1.00)
GFR, Estimated: >60 mL/min (ref >60)
Glucose, Bld: 302 mg/dL — ABNORMAL HIGH (ref 70–99)
Potassium: 3 mmol/L — ABNORMAL LOW (ref 3.5–5.1)
Sodium: 137 mmol/L (ref 135–145)
Total Bilirubin: 0.5 mg/dL (ref 0.3–1.2)
Total Protein: 6.6 g/dL (ref 6.5–8.1)

## 2022-06-01 LAB — CBC
HCT: 31.1 % — ABNORMAL LOW (ref 36.0–46.0)
Hemoglobin: 9.2 g/dL — ABNORMAL LOW (ref 12.0–15.0)
MCH: 24.9 pg — ABNORMAL LOW (ref 26.0–34.0)
MCHC: 29.6 g/dL — ABNORMAL LOW (ref 30.0–36.0)
MCV: 84.1 fL (ref 80.0–100.0)
Platelets: 286 10*3/uL (ref 150–400)
RBC: 3.7 MIL/uL — ABNORMAL LOW (ref 3.87–5.11)
RDW: 14 % (ref 11.5–15.5)
WBC: 8.1 10*3/uL (ref 4.0–10.5)
nRBC: 0 % (ref 0.0–0.2)

## 2022-06-01 LAB — TROPONIN I (HIGH SENSITIVITY)
Troponin I (High Sensitivity): 9 ng/L (ref <18)
Troponin I (High Sensitivity): 8 ng/L (ref <18)

## 2022-06-01 LAB — LACTIC ACID, PLASMA: Lactic Acid, Venous: 1.8 mmol/L (ref 0.5–1.9)

## 2022-06-01 MED ORDER — CEFPODOXIME PROXETIL 200 MG PO TABS: 200.0000 mg | ORAL_TABLET | Freq: Two times a day (BID) | ORAL | 0 refills | Status: AC

## 2022-06-01 MED ORDER — SODIUM CHLORIDE 0.9 % IV SOLN
1.0000 g | Freq: Once | INTRAVENOUS | Status: AC
  Administered 2022-06-01: 1 g via INTRAVENOUS
  Filled 2022-06-01: qty 10

## 2022-06-01 MED ORDER — SODIUM CHLORIDE 0.9 % IV BOLUS
1000.0000 mL | Freq: Once | INTRAVENOUS | Status: AC
  Administered 2022-06-01: 1000 mL via INTRAVENOUS

## 2022-06-01 MED ORDER — POTASSIUM CHLORIDE CRYS ER 20 MEQ PO TBCR
40.0000 meq | EXTENDED_RELEASE_TABLET | Freq: Once | ORAL | Status: AC
  Administered 2022-06-01: 40 meq via ORAL
  Filled 2022-06-01: qty 2

## 2022-06-01 MED ORDER — SODIUM CHLORIDE 0.9 % IV SOLN: Freq: Once | INTRAVENOUS | Status: AC

## 2022-06-01 NOTE — ED Notes (Signed)
Pt given snack and drink with verbal okay from EDP McHugh.

## 2022-06-01 NOTE — ED Notes (Signed)
Just finished pulling 1st set of blood cultures as blood pulled back from hand vein very slowly.

## 2022-06-01 NOTE — ED Notes (Signed)
New dressing applied to pt's R foot as EDP had assessed earlier and was left unwrapped.

## 2022-06-01 NOTE — ED Notes (Signed)
Attempted x1 for 22g at R fa.

## 2022-06-01 NOTE — ED Notes (Signed)
Imaging staff at bedside; will allow for CXR then collect 2nd set of blood cultures. HOB adjusted for pt.

## 2022-06-01 NOTE — Patient Instructions (Addendum)
Your procedure is scheduled on: 06/03/2022   Report to the Registration Desk on the 1st floor of the [facility]. To find out your arrival time, please call [phone] between 1PM - 3PM on: 06/02/2022  If your arrival time is 6:00 am, do not arrive before that time as the Medical Mall entrance doors do not open until 6:00 am.  REMEMBER: Instructions that are not followed completely may result in serious medical risk, up to and including death; or upon the discretion of your surgeon and anesthesiologist your surgery may need to be rescheduled.  Do not eat food after midnight the night before surgery.  No gum chewing or hard candies.   One week prior to surgery: Stop Anti-inflammatories (NSAIDS) such as Advil, Aleve, Ibuprofen, Motrin, Naproxen, Naprosyn and Aspirin based products such as Excedrin, Goody's Powder, BC Powder. Stop ANY OVER THE COUNTER supplements until after surgery. You may however, continue to take Tylenol if needed for pain up until the day of surgery.  Continue taking all prescribed medications with the exception of the following:          Eliquis-   **Follow guidelines for insulin and diabetes medications**  Follow recommendations from Cardiologist or PCP regarding stopping blood thinners.  TAKE ONLY THESE MEDICATIONS THE MORNING OF SURGERY WITH A SIP OF WATER:    Antacid (take one the night before and one on the morning of surgery - helps to prevent nausea after surgery.)  Use inhalers on the day of surgery and bring to the hospital.  Fleets enema or bowel prep as directed.  No Alcohol for 24 hours before or after surgery.  No Smoking including e-cigarettes for 24 hours before surgery.  No chewable tobacco products for at least 6 hours before surgery.  No nicotine patches on the day of surgery.  Do not use any "recreational" drugs for at least a week (preferably 2 weeks) before your surgery.  Please be advised that the combination of cocaine and  anesthesia may have negative outcomes, up to and including death. If you test positive for cocaine, your surgery will be cancelled.  On the morning of surgery brush your teeth with toothpaste and water, you may rinse your mouth with mouthwash if you wish. Do not swallow any toothpaste or mouthwash.  Use CHG Soap or wipes as directed on instruction sheet.  Do not wear jewelry, make-up, hairpins, clips or nail polish.  Do not wear lotions, powders, or perfumes.   Do not shave body hair from the neck down 48 hours before surgery.  Contact lenses, hearing aids and dentures may not be worn into surgery.  Do not bring valuables to the hospital. [facility] is not responsible for any missing/lost belongings or valuables.   Total Shoulder Arthroplasty:  use Benzoyl Peroxide 5% Gel as directed on instruction sheet.  Bring your C-PAP to the hospital in case you may have to spend the night.   Notify your doctor if there is any change in your medical condition (cold, fever, infection).  Wear comfortable clothing (specific to your surgery type) to the hospital.  After surgery, you can help prevent lung complications by doing breathing exercises.  Take deep breaths and cough every 1-2 hours. Your doctor may order a device called an Incentive Spirometer to help you take deep breaths. When coughing or sneezing, hold a pillow firmly against your incision with both hands. This is called "splinting." Doing this helps protect your incision. It also decreases belly discomfort.  If you  are being admitted to the hospital overnight, leave your suitcase in the car. After surgery it may be brought to your room.  In case of increased patient census, it may be necessary for you, the patient, to continue your postoperative care in the Same Day Surgery department.  If you are being discharged the day of surgery, you will not be allowed to drive home. You will need a responsible individual to drive you home and  stay with you for 24 hours after surgery.   If you are taking public transportation, you will need to have a responsible individual with you.  Please call the Pre-admissions Testing Dept. at [phone] if you have any questions about these instructions.  Surgery Visitation Policy:  Patients having surgery or a procedure may have two visitors.  Children under the age of 98 must have an adult with them who is not the patient.  Inpatient Visitation:    Visiting hours are 7 a.m. to 8 p.m. Up to four visitors are allowed at one time in a patient room. The visitors may rotate out with other people during the day.  One visitor age 17 or older may stay with the patient overnight and must be in the room by 8 p.m.     Preparing for Surgery with CHLORHEXIDINE GLUCONATE (CHG) Soap  Chlorhexidine Gluconate (CHG) Soap  o An antiseptic cleaner that kills germs and bonds with the skin to continue killing germs even after washing  o Used for showering the night before surgery and morning of surgery  Before surgery, you can play an important role by reducing the number of germs on your skin.  CHG (Chlorhexidine gluconate) soap is an antiseptic cleanser which kills germs and bonds with the skin to continue killing germs even after washing.  Please do not use if you have an allergy to CHG or antibacterial soaps. If your skin becomes reddened/irritated stop using the CHG.  1. Shower the NIGHT BEFORE SURGERY and the MORNING OF SURGERY with CHG soap.  2. If you choose to wash your hair, wash your hair first as usual with your normal shampoo.  3. After shampooing, rinse your hair and body thoroughly to remove the shampoo.  4. Use CHG as you would any other liquid soap. You can apply CHG directly to the skin and wash gently with a scrungie or a clean washcloth.  5. Apply the CHG soap to your body only from the neck down. Do not use on open wounds or open sores. Avoid contact with your eyes, ears,  mouth, and genitals (private parts). Wash face and genitals (private parts) with your normal soap.  6. Wash thoroughly, paying special attention to the area where your surgery will be performed.  7. Thoroughly rinse your body with warm water.  8. Do not shower/wash with your normal soap after using and rinsing off the CHG soap.  9. Pat yourself dry with a clean towel.  10. Wear clean pajamas to bed the night before surgery.  12. Place clean sheets on your bed the night of your first shower and do not sleep with pets.  13. Shower again with the CHG soap on the day of surgery prior to arriving at the hospital.  14. Do not apply any deodorants/lotions/powders.  15. Please wear clean clothes to the hospital.

## 2022-06-01 NOTE — Telephone Encounter (Signed)
Pt wants to speak to laura about medications before her procedure

## 2022-06-01 NOTE — ED Provider Notes (Signed)
[facility] Provider Note    Event Date/Time   First MD Initiated Contact with Patient 06/01/22 1458     (approximate)   History   Hypotension   HPI  Beth Carroll is a 52 y.o. female with past medical history of poorly controlled diabetes peripheral artery disease on Eliquis, who presents because of hypotension.  Patient was at preop where she was getting screened for right toe amputations on Thursday.  She was sitting in in the wheelchair and suddenly became very lightheaded dizzy and like her vision was going out.  She was found to be hypotensive.  She denies any associated chest pain palpitations or dyspnea.  Says prior to this was feeling fine.  Home nurse actually came out today and her blood pressure was in the 140s.  Says has been eating and drinking normally denies vomiting diarrhea.  No abdominal pain urinary symptoms fevers chills or respiratory symptoms.  Patient says she has had syncopal events in the past     Past Medical History:  Diagnosis Date   Acute deep vein thrombosis (DVT) of right lower extremity (HCC)    ADHD (attention deficit hyperactivity disorder)    AKI (acute kidney injury) (HCC) 04/30/2022   Aortic atherosclerosis (HCC)    COPD (chronic obstructive pulmonary disease) (HCC)    Coronary artery disease 12/15/2020   a.) LHC/PCI 12/15/2020: 99% mLAD (2.25 x 18 mm Onyx Frontier DES), 90% RI (2.75 x 22 mm Onyx Frontier DES), 40% m-dLAD   Diabetes mellitus without complication (HCC)    Diabetes mellitus, type 2 (HCC)    HFrEF (heart failure with reduced ejection fraction) (HCC)    a.) TTE 12/10/2020: EF 40-45%, apical/periapical HK, G2DD; b.) LHC 12/15/2020: EF 35-45%; c.) TTE 06/26/2021: EF 40-45%, LVH, G2DD; d.) TTE 03/21/2022: EF 55-60%, LV/apical sep/ant wall HK, Triv MR, AoV sclerosis   High anion gap metabolic acidosis 04/30/2022   Hyperlipemia    Hypertension    Hypophosphatemia 04/30/2022   Ischemic cardiomyopathy    a.)  TTE 12/10/2020: EF 40-45%; b.) LHC 12/15/2020: EF 35-45%; c.) TTE 06/26/2021: EF 40-45%; d.) TTE 03/21/2022: EF 55-60%   Meningioma (HCC)    a.) CT head and MRI brain 08/21/2021: LEFT parietal meningioma   Nausea vomiting and diarrhea 04/30/2022   Orthostatic hypotension    PAD (peripheral artery disease) (HCC)    Sepsis secondary to UTI (HCC) 04/30/2022   Tobacco use     Patient Active Problem List   Diagnosis Date Noted   COPD (chronic obstructive pulmonary disease) (HCC) 04/30/2022   Chronic wound 04/30/2022   DKA (diabetic ketoacidosis) (HCC) 04/21/2022   Hypotension 03/19/2022   Acute lower UTI (urinary tract infection) 03/19/2022   GERD without esophagitis 03/19/2022   Dyslipidemia 03/19/2022   Dehydration 03/19/2022   Septic shock due to Escherichia coli (HCC) 12/17/2021   Acute on chronic combined systolic and diastolic CHF (congestive heart failure) (HCC) 12/17/2021   CAD S/P percutaneous coronary angioplasty 12/17/2021   DKA, type 2 (HCC) 12/13/2021   Acute metabolic encephalopathy 12/13/2021   Protein-calorie malnutrition, severe 12/12/2021   Closed right hip fracture (HCC) 12/08/2021   Traumatic closed fracture of femoral condyle with minimal displacement, unspecified laterality, initial encounter (HCC) 12/08/2021   Atherosclerosis of artery of extremity with rest pain (HCC)    Ischemic leg 08/19/2021   Chronic systolic CHF (congestive heart failure) (HCC) 12/22/2020   PAD (peripheral artery disease) (HCC) 12/22/2020   Ischemic cardiomyopathy    Acute systolic heart failure (  HCC)    Hypertension    Rest pain of both lower extremities due to atherosclerosis (HCC)    Postural dizziness with presyncope    Hyperglycemia due to type 2 diabetes mellitus (HCC)    Hypokalemia    Tobacco abuse 10/23/2020   Type 2 diabetes mellitus without complications (HCC) 10/23/2020   Essential hypertension 10/23/2020   Stress fracture of left tibia 07/10/2019   Contusion of left knee  06/06/2019   Pain in left knee 05/08/2019     Physical Exam  Triage Vital Signs: ED Triage Vitals  Enc Vitals Group     BP 06/01/22 1413 (!) 69/53     Pulse Rate 06/01/22 1413 76     Resp 06/01/22 1413 17     Temp 06/01/22 1413 98.1 F (36.7 C)     Temp Source 06/01/22 1413 Oral     SpO2 06/01/22 1413 98 %     Weight 06/01/22 1411 118 lb (53.5 kg)     Height --      Head Circumference --      Peak Flow --      Pain Score 06/01/22 1411 0     Pain Loc --      Pain Edu? --      Excl. in GC? --     Most recent vital signs: Vitals:   06/01/22 1730 06/01/22 1800  BP: (!) 165/79 (!) 168/81  Pulse: 80 83  Resp: 19 19  Temp:    SpO2: 98% 98%     General: Awake, no distress.  CV:  Good peripheral perfusion.  Resp:  Normal effort.  Abd:  No distention.  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 There is ulceration on the right heel with granulation tissue and some exudate no purulence no foul odor no surrounding erythema 2+ DP pulse   ED Results / Procedures / Treatments  Labs (all labs ordered are listed, but only abnormal results are displayed) Labs Reviewed  CBC - Abnormal; Notable for the following components:      Result Value   RBC 3.70 (*)    Hemoglobin 9.2 (*)    HCT 31.1 (*)    MCH 24.9 (*)    MCHC 29.6 (*)    All other components within normal limits  COMPREHENSIVE METABOLIC PANEL - Abnormal; Notable for the following components:   Potassium 3.0 (*)    Glucose, Bld 302 (*)    Creatinine, Ser 0.43 (*)    Calcium 8.7 (*)    Albumin 2.9 (*)    All other components within normal limits  URINALYSIS, ROUTINE W REFLEX MICROSCOPIC - Abnormal; Notable for the following components:   Color, Urine YELLOW (*)    APPearance HAZY (*)    Glucose, UA >=500 (*)    Hgb urine dipstick MODERATE (*)    Leukocytes,Ua LARGE (*)    Bacteria, UA RARE (*)    All  other components within normal limits  CULTURE, BLOOD (ROUTINE X 2)  CULTURE, BLOOD (ROUTINE X 2)  URINE CULTURE  LACTIC ACID, PLASMA  TROPONIN I (HIGH SENSITIVITY)  TROPONIN I (HIGH SENSITIVITY)     EKG  EKG reviewed interpreted myself shows sinus rhythm with left axis deviation and diffuse T wave flattening similar to prior   RADIOLOGY  I reviewed and interpreted the CXR  which does not show any acute cardiopulmonary process   PROCEDURES:  Critical Care performed: No  Procedures  The patient is on the cardiac monitor to evaluate for evidence of arrhythmia and/or significant heart rate changes.   MEDICATIONS ORDERED IN ED: Medications  cefTRIAXone (ROCEPHIN) 1 g in sodium chloride 0.9 % 100 mL IVPB (has no administration in time range)  sodium chloride 0.9 % bolus 1,000 mL (0 mLs Intravenous Stopped 06/01/22 1618)  0.9 %  sodium chloride infusion (0 mLs Intravenous Stopped 06/01/22 1624)  potassium chloride SA (KLOR-CON M) CR tablet 40 mEq (40 mEq Oral Given 06/01/22 1647)     IMPRESSION / MDM / ASSESSMENT AND PLAN / ED COURSE  I reviewed the triage vital signs and the nursing notes.                              Patient's presentation is most consistent with acute presentation with potential threat to life or bodily function.  Differential diagnosis includes, but is not limited to, vasovagal episode, anemia, hypovolemia, sepsis  The patient is a 52 year old female who presents because of hypotension.  She was actually at preop for toe amputation scheduled for Thursday when she was sitting down and became acutely lightheaded feeling she was going to pass out and felt nauseous.  Blood pressure was low.  On arrival initial BP 60/40 in the ER.  However this responded quite well to fluids and quickly improves.  Patient does not have any complaints she denies any new GI symptoms urinary symptoms fevers chest pain or dyspnea.  She is going to have her first 2 right toes amputated  for gangrene.  Denies any acute change with this.  On my exam the patient looks somewhat chronically ill but nontoxic abdominal exam is benign she does have dry gangrene of the first 2 toes as well as ulceration on her heel which she says is actually improving.  She has good pulses in that foot.  Labs overall are reassuring.  She is hypokalemic with potassium of 3 which was supplemented orally.  She is hyperglycemic but there is no anion gap and bicarb is normal no indication for DKA.  Lactate is negative hemoglobin is near baseline no leukocytosis.  Troponins x 2 are negative.  Urinalysis obtained does show greater than 50 white cells.  I do see that patient has had sepsis from UTI in the past.  She is not having symptoms currently but with this hypotension and positive urine with history will treat with a dose of ceftriaxone and then cefdinir.   Patient only received 1 bolus of fluid in the ER and blood pressure has been completely stable and is now actually high.  I have suspicion that the hypotension today was the result of a vasovagal episode especially given how rapidly it came on and resolved.  Given patient is hemodynamically stable and asymptomatic I do think she can be discharged.  Will discharge with cefdinir.        FINAL CLINICAL IMPRESSION(S) / ED DIAGNOSES   Final diagnoses:  Hypotension, unspecified hypotension type  Urinary tract infection without hematuria, site unspecified     Rx / DC Orders   ED Discharge Orders     None        Note:  This document was prepared using Dragon voice recognition software and may include unintentional dictation errors.   Georga Hacking, MD 06/01/22 [phone]

## 2022-06-01 NOTE — ED Notes (Signed)
Both R arm IV's removed now.

## 2022-06-01 NOTE — Discharge Instructions (Signed)
You likely have a urinary tract infection.  Please take the antibiotic twice a day for the next 7 days.  Please call Dr. Driscilla Grammes office tomorrow to reschedule your preregistration.  If you develop fevers or feeling lightheaded or otherwise feeling unwell then please return to emergency department.

## 2022-06-01 NOTE — Pre-Procedure Instructions (Signed)
Patient came in as an in "person" pre admit testing however patient BP was 70/49, c/o lightheaded and blurred vision. Sent patient to ED. Surgical team made aware.  We will attempt to re-schedule her for this Friday (PAT) (phone call). Boyfriend accompanied patient.

## 2022-06-01 NOTE — ED Triage Notes (Signed)
Pt was brought over from the Pre admit testing for surgery. Pt is suppose to have her 1st and 2nd toe of the right foot amputated by Dr. Wyn Quaker. Pt sts that when she began to leave the house she started to feel lightheaded and dizzy.

## 2022-06-01 NOTE — ED Notes (Signed)
Pt shivering and states is cold; given warm blankets; pt states "I'm over this"; pt confirms that she has had many episodes like this recently; states toes to be amputated Thursday; pt's resp reg/unlabored, skin dry and resting on stretcher; visitor at bedside.

## 2022-06-02 ENCOUNTER — Encounter
Admission: RE | Admit: 2022-06-02 | Discharge: 2022-06-02 | Disposition: A | Discharge status: Home / Self Care | Payer: Medicaid Other | Source: Ambulatory Visit | Attending: Vascular Surgery | Admitting: Vascular Surgery

## 2022-06-02 ENCOUNTER — Other Ambulatory Visit: Payer: Medicaid Other

## 2022-06-02 ENCOUNTER — Other Ambulatory Visit: Payer: Self-pay

## 2022-06-02 ENCOUNTER — Other Ambulatory Visit (INDEPENDENT_AMBULATORY_CARE_PROVIDER_SITE_OTHER): Payer: Self-pay | Admitting: Nurse Practitioner

## 2022-06-02 DIAGNOSIS — Z1152 Encounter for screening for COVID-19: Secondary | ICD-10-CM

## 2022-06-02 DIAGNOSIS — Z01812 Encounter for preprocedural laboratory examination: Secondary | ICD-10-CM

## 2022-06-02 DIAGNOSIS — E876 Hypokalemia: Secondary | ICD-10-CM

## 2022-06-02 LAB — URINE CULTURE: Culture: NO GROWTH

## 2022-06-02 NOTE — Patient Instructions (Signed)
Your procedure is scheduled on: 06/03/2022   Report to the Registration Desk on the 1st floor of the [facility]. To find out your arrival time, please call [phone] between 1PM - 3PM on: 06/02/2022  If your arrival time is 6:00 am, do not arrive before that time as the Medical Mall entrance doors do not open until 6:00 am.  REMEMBER: Instructions that are not followed completely may result in serious medical risk, up to and including death; or upon the discretion of your surgeon and anesthesiologist your surgery may need to be rescheduled.  Do not eat food after midnight the night before surgery.  No gum chewing or hard candies.   One week prior to surgery: Stop Anti-inflammatories (NSAIDS) such as Advil, Aleve, Ibuprofen, Motrin, Naproxen, Naprosyn and Aspirin based products such as Excedrin, Goody's Powder, BC Powder. Stop ANY OVER THE COUNTER supplements until after surgery like Vit D and multivitmains You may however, continue to take Tylenol if needed for pain up until the day of surgery.  Continue taking all prescribed medications with the exception of the following:          Eliquis- do not take it on am before  surgery . Ask surgeon  after surgery         Aspirin- do not take it on am before  surgery.  Ask surgeon  after surgery         Aspirin-  do not take it on am before  surgery.  Ask surgeon after surgery  Take half dose of lantus at bedtime only    TAKE ONLY THESE MEDICATIONS THE MORNING OF SURGERY WITH A SIP OF WATER:  atorvastatin (LIPITOR)  pantoprazole (PROTONIX)  (take one the night before and one on the morning of surgery - helps to prevent nausea after surgery.) cefpodoxime (VANTIN)   Use inhalers on the day of surgery and bring to the hospital.   No Alcohol for 24 hours before or after surgery.  No Smoking including e-cigarettes for 24 hours before surgery.  No chewable tobacco products for at least 6 hours before surgery.  No nicotine patches on the  day of surgery.  Do not use any "recreational" drugs for at least a week (preferably 2 weeks) before your surgery.  Please be advised that the combination of cocaine and anesthesia may have negative outcomes, up to and including death. If you test positive for cocaine, your surgery will be cancelled.  On the morning of surgery brush your teeth with toothpaste and water, you may rinse your mouth with mouthwash if you wish. Do not swallow any toothpaste or mouthwash.  Use CHG wipes as directed on instruction sheet.  Do not wear jewelry, make-up, hairpins, clips or nail polish.  Do not wear lotions, powders, or perfumes.   Do not shave body hair from the neck down 48 hours before surgery.  Contact lenses, hearing aids and dentures may not be worn into surgery.  Do not bring valuables to the hospital. [facility] is not responsible for any missing/lost belongings or valuables.    Notify your doctor if there is any change in your medical condition (cold, fever, infection).  Wear comfortable clothing (specific to your surgery type) to the hospital.  After surgery, you can help prevent lung complications by doing breathing exercises.  Take deep breaths and cough every 1-2 hours. Your doctor may order a device called an Incentive Spirometer to help you take deep breaths. If you are being admitted to the  hospital overnight, leave your suitcase in the car. After surgery it may be brought to your room.  In case of increased patient census, it may be necessary for you, the patient, to continue your postoperative care in the Same Day Surgery department.  If you are being discharged the day of surgery, you will not be allowed to drive home. You will need a responsible individual to drive you home and stay with you for 24 hours after surgery.    Please call the Pre-admissions Testing Dept. at [phone] if you have any questions about these instructions.  Surgery Visitation  Policy:  Patients having surgery or a procedure may have two visitors.  Children under the age of 69 must have an adult with them who is not the patient.  Inpatient Visitation:    Visiting hours are 7 a.m. to 8 p.m. Up to four visitors are allowed at one time in a patient room. The visitors may rotate out with other people during the day.  One visitor age 84 or older may stay with the patient overnight and must be in the room by 8 p.m.    Preparing the Skin Before Surgery     To help prevent the risk of infection at your surgical site, we are now providing you with rinse-free Sage 2% Chlorhexidine Gluconate (CHG) disposable wipes.  Chlorhexidine Gluconate (CHG) Soap  o An antiseptic cleaner that kills germs and bonds with the skin to continue killing germs even after washing  o Used for showering the night before surgery and morning of surgery  The night before surgery: Shower or bathe with warm water. Do not apply perfume, lotions, powders. Wait one hour after shower. Skin should be dry and cool. Open Sage wipe package - use 6 disposable cloths. Wipe body using one cloth for the right arm, one cloth for the left arm, one cloth for the right leg, one cloth for the left leg, one cloth for the chest/abdomen area, and one cloth for the back. Do not use on open wounds or sores. Do not use on face or genitals (private parts). If you are breast feeding, do not use on breasts. 5. Do not rinse, allow to dry. 6. Skin may feel "tacky" for several minutes. 7. Dress in clean clothes. 8. Place clean sheets on your bed and do not sleep with pets.  REPEAT ABOVE ON THE MORNING OF SURGERY BEFORE ARRIVING TO THE HOSPITAL.

## 2022-06-03 ENCOUNTER — Observation Stay
Admission: RE | Admit: 2022-06-03 | Discharge: 2022-06-04 | Disposition: A | Discharge status: Home / Self Care | Payer: Medicaid Other | Attending: Vascular Surgery | Admitting: Vascular Surgery

## 2022-06-03 ENCOUNTER — Encounter: Payer: Self-pay | Admitting: Vascular Surgery

## 2022-06-03 ENCOUNTER — Ambulatory Visit: Payer: Medicaid Other | Admitting: Urgent Care

## 2022-06-03 ENCOUNTER — Other Ambulatory Visit: Payer: Self-pay

## 2022-06-03 ENCOUNTER — Encounter
Admission: RE | Disposition: A | Discharge status: Home / Self Care | Payer: Self-pay | Source: Home / Self Care | Attending: Vascular Surgery

## 2022-06-03 DIAGNOSIS — Z79899 Other long term (current) drug therapy: Secondary | ICD-10-CM | POA: Diagnosis not present

## 2022-06-03 DIAGNOSIS — I110 Hypertensive heart disease with heart failure: Secondary | ICD-10-CM | POA: Diagnosis not present

## 2022-06-03 DIAGNOSIS — Z86718 Personal history of other venous thrombosis and embolism: Secondary | ICD-10-CM | POA: Insufficient documentation

## 2022-06-03 DIAGNOSIS — I5022 Chronic systolic (congestive) heart failure: Secondary | ICD-10-CM | POA: Insufficient documentation

## 2022-06-03 DIAGNOSIS — I255 Ischemic cardiomyopathy: Secondary | ICD-10-CM | POA: Diagnosis not present

## 2022-06-03 DIAGNOSIS — I96 Gangrene, not elsewhere classified: Secondary | ICD-10-CM | POA: Diagnosis present

## 2022-06-03 DIAGNOSIS — I2510 Atherosclerotic heart disease of native coronary artery without angina pectoris: Secondary | ICD-10-CM | POA: Diagnosis not present

## 2022-06-03 DIAGNOSIS — Z7901 Long term (current) use of anticoagulants: Secondary | ICD-10-CM | POA: Diagnosis not present

## 2022-06-03 DIAGNOSIS — K219 Gastro-esophageal reflux disease without esophagitis: Secondary | ICD-10-CM | POA: Insufficient documentation

## 2022-06-03 DIAGNOSIS — Z01812 Encounter for preprocedural laboratory examination: Secondary | ICD-10-CM

## 2022-06-03 DIAGNOSIS — I252 Old myocardial infarction: Secondary | ICD-10-CM | POA: Diagnosis not present

## 2022-06-03 DIAGNOSIS — E785 Hyperlipidemia, unspecified: Secondary | ICD-10-CM | POA: Insufficient documentation

## 2022-06-03 DIAGNOSIS — Z955 Presence of coronary angioplasty implant and graft: Secondary | ICD-10-CM | POA: Diagnosis not present

## 2022-06-03 DIAGNOSIS — J449 Chronic obstructive pulmonary disease, unspecified: Secondary | ICD-10-CM | POA: Insufficient documentation

## 2022-06-03 DIAGNOSIS — Z87891 Personal history of nicotine dependence: Secondary | ICD-10-CM | POA: Diagnosis not present

## 2022-06-03 DIAGNOSIS — Z794 Long term (current) use of insulin: Secondary | ICD-10-CM | POA: Diagnosis not present

## 2022-06-03 DIAGNOSIS — E1152 Type 2 diabetes mellitus with diabetic peripheral angiopathy with gangrene: Secondary | ICD-10-CM | POA: Diagnosis not present

## 2022-06-03 DIAGNOSIS — Z7902 Long term (current) use of antithrombotics/antiplatelets: Secondary | ICD-10-CM | POA: Diagnosis not present

## 2022-06-03 DIAGNOSIS — E876 Hypokalemia: Secondary | ICD-10-CM

## 2022-06-03 HISTORY — DX: Attention-deficit hyperactivity disorder, unspecified type: F90.9

## 2022-06-03 HISTORY — PX: AMPUTATION: SHX166

## 2022-06-03 HISTORY — DX: Chronic obstructive pulmonary disease, unspecified: J44.9

## 2022-06-03 LAB — GLUCOSE, CAPILLARY
Glucose-Capillary: 325 mg/dL — ABNORMAL HIGH (ref 70–99)
Glucose-Capillary: 267 mg/dL — ABNORMAL HIGH (ref 70–99)
Glucose-Capillary: 230 mg/dL — ABNORMAL HIGH (ref 70–99)
Glucose-Capillary: 115 mg/dL — ABNORMAL HIGH (ref 70–99)
Glucose-Capillary: 112 mg/dL — ABNORMAL HIGH (ref 70–99)

## 2022-06-03 LAB — TYPE AND SCREEN
ABO/RH(D): A POS
Antibody Screen: NEGATIVE

## 2022-06-03 LAB — POCT I-STAT, CHEM 8
BUN: 5 mg/dL — ABNORMAL LOW (ref 6–20)
Calcium, Ion: 1.23 mmol/L (ref 1.15–1.40)
Chloride: 98 mmol/L (ref 98–111)
Creatinine, Ser: 0.3 mg/dL — ABNORMAL LOW (ref 0.44–1.00)
Glucose, Bld: 354 mg/dL — ABNORMAL HIGH (ref 70–99)
HCT: 28 % — ABNORMAL LOW (ref 36.0–46.0)
Hemoglobin: 9.5 g/dL — ABNORMAL LOW (ref 12.0–15.0)
Potassium: 3 mmol/L — ABNORMAL LOW (ref 3.5–5.1)
Sodium: 139 mmol/L (ref 135–145)
TCO2: 27 mmol/L (ref 22–32)

## 2022-06-03 SURGERY — AMPUTATION, FOOT, RAY
Anesthesia: General | Laterality: Right

## 2022-06-03 MED ORDER — FENTANYL CITRATE (PF) 100 MCG/2ML IJ SOLN: 25.0000 ug | INTRAMUSCULAR | Status: DC | PRN

## 2022-06-03 MED ORDER — MIDAZOLAM HCL 2 MG/2ML IJ SOLN
INTRAMUSCULAR | Status: DC | PRN
  Administered 2022-06-03: 2 mg via INTRAVENOUS

## 2022-06-03 MED ORDER — PANTOPRAZOLE SODIUM 40 MG PO TBEC
40.0000 mg | DELAYED_RELEASE_TABLET | Freq: Every day | ORAL | Status: DC
  Administered 2022-06-04: 40 mg via ORAL
  Filled 2022-06-03: qty 1

## 2022-06-03 MED ORDER — ONDANSETRON HCL 4 MG/2ML IJ SOLN: 4.0000 mg | Freq: Four times a day (QID) | INTRAMUSCULAR | Status: DC | PRN

## 2022-06-03 MED ORDER — PHENYLEPHRINE 80 MCG/ML (10ML) SYRINGE FOR IV PUSH (FOR BLOOD PRESSURE SUPPORT)
PREFILLED_SYRINGE | INTRAVENOUS | Status: DC | PRN
  Administered 2022-06-03: 80 ug via INTRAVENOUS

## 2022-06-03 MED ORDER — ACETAMINOPHEN 10 MG/ML IV SOLN: 1000.0000 mg | Freq: Once | INTRAVENOUS | Status: DC | PRN

## 2022-06-03 MED ORDER — INSULIN ASPART 100 UNIT/ML IJ SOLN
7.0000 [IU] | Freq: Once | INTRAMUSCULAR | Status: AC
  Administered 2022-06-03: 7 [IU] via SUBCUTANEOUS

## 2022-06-03 MED ORDER — LACTATED RINGERS IV SOLN: INTRAVENOUS | Status: DC

## 2022-06-03 MED ORDER — CHLORHEXIDINE GLUCONATE 0.12 % MT SOLN
OROMUCOSAL | Status: AC
  Filled 2022-06-03: qty 15

## 2022-06-03 MED ORDER — HYDROMORPHONE HCL 1 MG/ML IJ SOLN: 1.0000 mg | Freq: Once | INTRAMUSCULAR | Status: DC | PRN

## 2022-06-03 MED ORDER — ORAL CARE MOUTH RINSE: 15.0000 mL | Freq: Once | OROMUCOSAL | Status: AC

## 2022-06-03 MED ORDER — APIXABAN 5 MG PO TABS
5.0000 mg | ORAL_TABLET | Freq: Two times a day (BID) | ORAL | Status: DC
  Administered 2022-06-04: 5 mg via ORAL
  Filled 2022-06-03: qty 1

## 2022-06-03 MED ORDER — ATORVASTATIN CALCIUM 20 MG PO TABS
80.0000 mg | ORAL_TABLET | Freq: Every day | ORAL | Status: DC
  Administered 2022-06-04: 80 mg via ORAL
  Filled 2022-06-03: qty 4

## 2022-06-03 MED ORDER — CLOPIDOGREL BISULFATE 75 MG PO TABS
75.0000 mg | ORAL_TABLET | Freq: Every day | ORAL | Status: DC
  Administered 2022-06-03 – 2022-06-04 (×2): 75 mg via ORAL
  Filled 2022-06-03 (×2): qty 1

## 2022-06-03 MED ORDER — CEFAZOLIN SODIUM-DEXTROSE 2-4 GM/100ML-% IV SOLN
2.0000 g | INTRAVENOUS | Status: AC
  Administered 2022-06-03: 2 g via INTRAVENOUS

## 2022-06-03 MED ORDER — INSULIN GLARGINE-YFGN 100 UNIT/ML ~~LOC~~ SOLN
30.0000 [IU] | Freq: Every day | SUBCUTANEOUS | Status: DC
  Administered 2022-06-04: 30 [IU] via SUBCUTANEOUS
  Filled 2022-06-03 (×2): qty 0.3

## 2022-06-03 MED ORDER — ALUM & MAG HYDROXIDE-SIMETH 200-200-20 MG/5ML PO SUSP: 15.0000 mL | ORAL | Status: DC | PRN

## 2022-06-03 MED ORDER — SORBITOL 70 % SOLN: 30.0000 mL | Freq: Every day | Status: DC | PRN

## 2022-06-03 MED ORDER — BUPIVACAINE HCL (PF) 0.5 % IJ SOLN
INTRAMUSCULAR | Status: AC
  Filled 2022-06-03: qty 30

## 2022-06-03 MED ORDER — CHLORHEXIDINE GLUCONATE CLOTH 2 % EX PADS: 6.0000 | MEDICATED_PAD | Freq: Once | CUTANEOUS | Status: DC

## 2022-06-03 MED ORDER — ADULT MULTIVITAMIN W/MINERALS CH: 1.0000 | ORAL_TABLET | Freq: Every day | ORAL | Status: DC

## 2022-06-03 MED ORDER — LABETALOL HCL 5 MG/ML IV SOLN
10.0000 mg | INTRAVENOUS | Status: DC | PRN
  Administered 2022-06-04: 10 mg via INTRAVENOUS
  Filled 2022-06-03: qty 4

## 2022-06-03 MED ORDER — ALBUTEROL SULFATE (2.5 MG/3ML) 0.083% IN NEBU: 3.0000 mL | INHALATION_SOLUTION | Freq: Four times a day (QID) | RESPIRATORY_TRACT | Status: DC | PRN

## 2022-06-03 MED ORDER — PANTOPRAZOLE SODIUM 40 MG PO TBEC
40.0000 mg | DELAYED_RELEASE_TABLET | Freq: Every day | ORAL | Status: DC
Start: 2022-06-03 — End: 2022-06-03

## 2022-06-03 MED ORDER — FENTANYL CITRATE (PF) 100 MCG/2ML IJ SOLN
INTRAMUSCULAR | Status: AC
  Filled 2022-06-03: qty 2

## 2022-06-03 MED ORDER — ONDANSETRON HCL 4 MG/2ML IJ SOLN: 4.0000 mg | Freq: Once | INTRAMUSCULAR | Status: DC | PRN

## 2022-06-03 MED ORDER — PROPOFOL 500 MG/50ML IV EMUL
INTRAVENOUS | Status: DC | PRN
  Administered 2022-06-03: 155 ug/kg/min via INTRAVENOUS

## 2022-06-03 MED ORDER — ONDANSETRON HCL 4 MG/2ML IJ SOLN
INTRAMUSCULAR | Status: DC | PRN
  Administered 2022-06-03: 4 mg via INTRAVENOUS

## 2022-06-03 MED ORDER — 0.9 % SODIUM CHLORIDE (POUR BTL) OPTIME
TOPICAL | Status: DC | PRN
  Administered 2022-06-03: 500 mL

## 2022-06-03 MED ORDER — INSULIN ASPART 100 UNIT/ML IJ SOLN
8.0000 [IU] | Freq: Three times a day (TID) | INTRAMUSCULAR | Status: DC
  Administered 2022-06-04: 8 [IU] via SUBCUTANEOUS
  Filled 2022-06-03: qty 1

## 2022-06-03 MED ORDER — SODIUM CHLORIDE 0.9 % IV SOLN: INTRAVENOUS | Status: DC

## 2022-06-03 MED ORDER — OXYCODONE-ACETAMINOPHEN 5-325 MG PO TABS
1.0000 | ORAL_TABLET | ORAL | Status: DC | PRN
  Administered 2022-06-03 – 2022-06-04 (×4): 2 via ORAL
  Filled 2022-06-03 (×4): qty 2

## 2022-06-03 MED ORDER — GLYCOPYRROLATE 0.2 MG/ML IJ SOLN
INTRAMUSCULAR | Status: DC | PRN
  Administered 2022-06-03: .2 mg via INTRAVENOUS

## 2022-06-03 MED ORDER — CEFDINIR 300 MG PO CAPS
300.0000 mg | ORAL_CAPSULE | Freq: Two times a day (BID) | ORAL | Status: DC
  Administered 2022-06-03 – 2022-06-04 (×2): 300 mg via ORAL
  Filled 2022-06-03 (×2): qty 1

## 2022-06-03 MED ORDER — SENNOSIDES-DOCUSATE SODIUM 8.6-50 MG PO TABS: 1.0000 | ORAL_TABLET | Freq: Every evening | ORAL | Status: DC | PRN

## 2022-06-03 MED ORDER — ACETAMINOPHEN 325 MG PO TABS: 650.0000 mg | ORAL_TABLET | Freq: Three times a day (TID) | ORAL | Status: DC | PRN

## 2022-06-03 MED ORDER — INSULIN ASPART 100 UNIT/ML IJ SOLN
INTRAMUSCULAR | Status: AC
  Filled 2022-06-03: qty 1

## 2022-06-03 MED ORDER — ORAL CARE MOUTH RINSE: 15.0000 mL | OROMUCOSAL | Status: DC | PRN

## 2022-06-03 MED ORDER — OXYCODONE HCL 5 MG PO TABS: 5.0000 mg | ORAL_TABLET | Freq: Once | ORAL | Status: DC | PRN

## 2022-06-03 MED ORDER — HYDRALAZINE HCL 20 MG/ML IJ SOLN: 5.0000 mg | INTRAMUSCULAR | Status: DC | PRN

## 2022-06-03 MED ORDER — GUAIFENESIN-DM 100-10 MG/5ML PO SYRP: 15.0000 mL | ORAL_SOLUTION | ORAL | Status: DC | PRN

## 2022-06-03 MED ORDER — JUVEN PO PACK
1.0000 | PACK | Freq: Two times a day (BID) | ORAL | Status: DC
  Administered 2022-06-03 – 2022-06-04 (×2): 1 via ORAL

## 2022-06-03 MED ORDER — MORPHINE SULFATE (PF) 2 MG/ML IV SOLN: 2.0000 mg | INTRAVENOUS | Status: DC | PRN

## 2022-06-03 MED ORDER — MIDAZOLAM HCL 2 MG/2ML IJ SOLN
INTRAMUSCULAR | Status: AC
  Filled 2022-06-03: qty 2

## 2022-06-03 MED ORDER — METOPROLOL TARTRATE 5 MG/5ML IV SOLN
2.0000 mg | INTRAVENOUS | Status: DC | PRN
  Filled 2022-06-03: qty 5

## 2022-06-03 MED ORDER — FENTANYL CITRATE (PF) 100 MCG/2ML IJ SOLN
INTRAMUSCULAR | Status: DC | PRN
  Administered 2022-06-03 (×4): 25 ug via INTRAVENOUS

## 2022-06-03 MED ORDER — PHENOL 1.4 % MT LIQD: 1.0000 | OROMUCOSAL | Status: DC | PRN

## 2022-06-03 MED ORDER — TIOTROPIUM BROMIDE MONOHYDRATE 18 MCG IN CAPS
1.0000 | ORAL_CAPSULE | Freq: Every day | RESPIRATORY_TRACT | Status: DC
  Administered 2022-06-04: 18 ug via RESPIRATORY_TRACT
  Filled 2022-06-03: qty 5

## 2022-06-03 MED ORDER — POTASSIUM CHLORIDE CRYS ER 20 MEQ PO TBCR
20.0000 meq | EXTENDED_RELEASE_TABLET | Freq: Once | ORAL | Status: AC
  Administered 2022-06-03: 40 meq via ORAL
  Filled 2022-06-03: qty 2

## 2022-06-03 MED ORDER — ENSURE ENLIVE PO LIQD: 237.0000 mL | Freq: Three times a day (TID) | ORAL | Status: DC

## 2022-06-03 MED ORDER — VITAMIN D (ERGOCALCIFEROL) 1.25 MG (50000 UNIT) PO CAPS: 50000.0000 [IU] | ORAL_CAPSULE | ORAL | Status: DC

## 2022-06-03 MED ORDER — OXYCODONE HCL 5 MG/5ML PO SOLN: 5.0000 mg | Freq: Once | ORAL | Status: DC | PRN

## 2022-06-03 MED ORDER — EPHEDRINE SULFATE (PRESSORS) 50 MG/ML IJ SOLN
INTRAMUSCULAR | Status: DC | PRN
  Administered 2022-06-03 (×2): 5 mg via INTRAVENOUS

## 2022-06-03 MED ORDER — BUPIVACAINE HCL (PF) 0.5 % IJ SOLN
INTRAMUSCULAR | Status: DC | PRN
  Administered 2022-06-03: 30 mL

## 2022-06-03 MED ORDER — ASPIRIN 81 MG PO TBEC
81.0000 mg | DELAYED_RELEASE_TABLET | Freq: Every day | ORAL | Status: DC
  Administered 2022-06-03 – 2022-06-04 (×2): 81 mg via ORAL
  Filled 2022-06-03 (×2): qty 1

## 2022-06-03 MED ORDER — CHLORHEXIDINE GLUCONATE 0.12 % MT SOLN
15.0000 mL | Freq: Once | OROMUCOSAL | Status: AC
  Administered 2022-06-03: 15 mL via OROMUCOSAL

## 2022-06-03 MED ORDER — CALCIUM CARBONATE ANTACID 500 MG PO CHEW
1.0000 | CHEWABLE_TABLET | Freq: Three times a day (TID) | ORAL | Status: DC
  Administered 2022-06-03 – 2022-06-04 (×2): 200 mg via ORAL
  Filled 2022-06-03 (×2): qty 1

## 2022-06-03 MED ORDER — PROPOFOL 10 MG/ML IV BOLUS
INTRAVENOUS | Status: DC | PRN
  Administered 2022-06-03: 40 mg via INTRAVENOUS

## 2022-06-03 MED ORDER — CHLORHEXIDINE GLUCONATE CLOTH 2 % EX PADS
6.0000 | MEDICATED_PAD | Freq: Once | CUTANEOUS | Status: AC
  Administered 2022-06-03: 6 via TOPICAL

## 2022-06-03 SURGICAL SUPPLY — 36 items
APL PRP STRL LF DISP 70% ISPRP (MISCELLANEOUS) ×1
BNDG CMPR STD VLCR NS LF 5.8X6 (GAUZE/BANDAGES/DRESSINGS) ×1
BNDG ELASTIC 6X5.8 VLCR NS LF (GAUZE/BANDAGES/DRESSINGS) ×1 IMPLANT
BNDG GAUZE DERMACEA FLUFF 4 (GAUZE/BANDAGES/DRESSINGS) ×2 IMPLANT
BNDG GZE DERMACEA 4 6PLY (GAUZE/BANDAGES/DRESSINGS) ×1
BRUSH SCRUB EZ  4% CHG (MISCELLANEOUS) ×1
BRUSH SCRUB EZ 4% CHG (MISCELLANEOUS) ×1 IMPLANT
CHLORAPREP W/TINT 26 (MISCELLANEOUS) ×1 IMPLANT
ELECT CAUTERY BLADE 6.4 (BLADE) ×1 IMPLANT
ELECT REM PT RETURN 9FT ADLT (ELECTROSURGICAL) ×1
ELECTRODE REM PT RTRN 9FT ADLT (ELECTROSURGICAL) ×1 IMPLANT
GAUZE SPONGE 4X4 12PLY STRL (GAUZE/BANDAGES/DRESSINGS) ×1 IMPLANT
GAUZE XEROFORM 1X8 LF (GAUZE/BANDAGES/DRESSINGS) ×1 IMPLANT
GLOVE BIO SURGEON STRL SZ7 (GLOVE) ×2 IMPLANT
GOWN STRL REUS W/ TWL LRG LVL3 (GOWN DISPOSABLE) ×2 IMPLANT
GOWN STRL REUS W/ TWL XL LVL3 (GOWN DISPOSABLE) ×1 IMPLANT
GOWN STRL REUS W/TWL LRG LVL3 (GOWN DISPOSABLE) ×2
GOWN STRL REUS W/TWL XL LVL3 (GOWN DISPOSABLE) ×1
HANDLE YANKAUER SUCT BULB TIP (MISCELLANEOUS) ×1 IMPLANT
KIT TURNOVER KIT A (KITS) ×1 IMPLANT
LABEL OR SOLS (LABEL) ×1 IMPLANT
MANIFOLD NEPTUNE II (INSTRUMENTS) ×1 IMPLANT
NS IRRIG 1000ML POUR BTL (IV SOLUTION) ×1 IMPLANT
PACK EXTREMITY ARMC (MISCELLANEOUS) ×1 IMPLANT
PAD PREP OB/GYN DISP 24X41 (PERSONAL CARE ITEMS) ×1 IMPLANT
SOL PREP PVP 2OZ (MISCELLANEOUS) ×1
SOLUTION PREP PVP 2OZ (MISCELLANEOUS) ×1 IMPLANT
SPONGE T-LAP 18X18 ~~LOC~~+RFID (SPONGE) ×1 IMPLANT
STOCKINETTE BIAS CUT 6 980064 (GAUZE/BANDAGES/DRESSINGS) ×1 IMPLANT
STOCKINETTE STRL 6IN 960660 (GAUZE/BANDAGES/DRESSINGS) IMPLANT
SUT ETHILON 3-0 FS-10 30 BLK (SUTURE) ×1
SUT VIC AB 3-0 SH 27 (SUTURE) ×1
SUT VIC AB 3-0 SH 27X BRD (SUTURE) ×1 IMPLANT
SUTURE EHLN 3-0 FS-10 30 BLK (SUTURE) ×1 IMPLANT
TRAP FLUID SMOKE EVACUATOR (MISCELLANEOUS) ×1 IMPLANT
WATER STERILE IRR 500ML POUR (IV SOLUTION) ×1 IMPLANT

## 2022-06-03 NOTE — Transfer of Care (Signed)
Immediate Anesthesia Transfer of Care Note  Patient: Beth Carroll  Procedure(s) Performed: AMPUTATION RAY ( 1ST AND 2ND TOE) (Right)  Patient Location: PACU  Anesthesia Type:General  Level of Consciousness: drowsy and patient cooperative  Airway & Oxygen Therapy: Patient Spontanous Breathing and Patient connected to face mask oxygen  Post-op Assessment: Report given to RN and Post -op Vital signs reviewed and stable  Post vital signs: Reviewed and stable  Last Vitals:  Vitals Value Taken Time  BP    Temp    Pulse 93 06/03/22 1119  Resp 14 06/03/22 1119  SpO2 100 % 06/03/22 1119  Vitals shown include unvalidated device data.  Last Pain:  Vitals:   06/03/22 0817  TempSrc: Temporal  PainSc: 0-No pain         Complications: No notable events documented.

## 2022-06-03 NOTE — Anesthesia Procedure Notes (Signed)
Procedure Name: General with mask airway Date/Time: 06/03/2022 10:53 AM  Performed by: Mohammed Kindle, CRNAPre-anesthesia Checklist: Patient identified, Emergency Drugs available, Suction available and Patient being monitored Patient Re-evaluated:Patient Re-evaluated prior to induction Oxygen Delivery Method: Simple face mask Induction Type: IV induction Placement Confirmation: positive ETCO2, CO2 detector and breath sounds checked- equal and bilateral Dental Injury: Teeth and Oropharynx as per pre-operative assessment

## 2022-06-03 NOTE — Interval H&P Note (Signed)
History and Physical Interval Note:  06/03/2022 9:39 AM  Beth Carroll  has presented today for surgery, with the diagnosis of ASO WITH GANGRENE.  The various methods of treatment have been discussed with the patient and family. After consideration of risks, benefits and other options for treatment, the patient has consented to  Procedure(s): AMPUTATION RAY ( 1ST AND 2ND TOE) (Right) as a surgical intervention.  The patient's history has been reviewed, patient examined, no change in status, stable for surgery.  I have reviewed the patient's chart and labs.  Questions were answered to the patient's satisfaction.     Festus Barren

## 2022-06-03 NOTE — Anesthesia Postprocedure Evaluation (Signed)
Anesthesia Post Note  Patient: Beth Carroll  Procedure(s) Performed: AMPUTATION RAY ( 1ST AND 2ND TOE) (Right)  Patient location during evaluation: PACU Anesthesia Type: General Level of consciousness: awake and alert, oriented and patient cooperative Pain management: pain level controlled Vital Signs Assessment: post-procedure vital signs reviewed and stable Respiratory status: spontaneous breathing, nonlabored ventilation and respiratory function stable Cardiovascular status: blood pressure returned to baseline and stable Postop Assessment: adequate PO intake Anesthetic complications: no   No notable events documented.   Last Vitals:  Vitals:   06/03/22 1203 06/03/22 1215  BP:  (!) 162/82  Pulse: 88 86  Resp: 13 13  Temp:    SpO2: 100% 100%    Last Pain:  Vitals:   06/03/22 1145  TempSrc:   PainSc: 0-No pain                 Reed Breech

## 2022-06-03 NOTE — Telephone Encounter (Signed)
Patient was made aware when she was scheduled that for a surgery she would need to complete her pre-op appt as they discuss medications to take or not take.

## 2022-06-03 NOTE — Op Note (Signed)
  OPERATIVE NOTE   PROCEDURE: Right first and second toe Ray amputation  PRE-OPERATIVE DIAGNOSIS: Right first and second toe gangrene  POST-OPERATIVE DIAGNOSIS: same as above  SURGEON: Festus Barren, MD  ASSISTANT(S): none  ANESTHESIA: general  ESTIMATED BLOOD LOSS: 25 cc  FINDING(S): none  SPECIMEN(S):  right first and second toe and metatarsal heads  INDICATIONS:    Patient is a 52 y.o.female who presents with right first and second toe gangrene.  The patient is scheduled for digital amputation.  I discussed in depth with the patient the risks, benefits, and alternatives to this procedure.  The patient is aware that the risk of this operation included but are not limited to:  bleeding, infection, myocardial infarction, stroke, death, failure to heal amputation wound, and possible need for more proximal amputation.  The patient is aware of the risks and agrees proceed forward with the procedure.  DESCRIPTION:  After full informed written consent was obtained from the patient, the patient was brought back to the operating room, and placed supine upon the operating table.  Prior to induction, the patient received IV antibiotics.  The patient was then prepped and draped in the standard fashion. Curvilinear incision was made at the base of the first and second toes in a continuous fashion. We dissected down to the bone with electrocautery and sharp dissection on both toes. The digital vessels were controlled with electrocautery. The right first toe was removed its entirety with dissection with electrocautery and blunt dissection. Then, the right second toe was removed in its entirety as well. The metatarsal heads were taken back with small bone cutter and Ronjours to allow tension-free closure. The wound was then closed with figure-of-eight 2-0 Vicryl, a running layer of 3-0 Vicryl and several 3-0 nylon mattress sutures for the skin. Sterile bulky dressing was placed.  COMPLICATIONS:  none  CONDITION: stable   Festus Barren, MD 06/03/2022 11:20 AM

## 2022-06-03 NOTE — Anesthesia Preprocedure Evaluation (Addendum)
Anesthesia Evaluation  Patient identified by MRN, date of birth, ID band Patient awake    Reviewed: Allergy & Precautions, NPO status , Patient's Chart, lab work & pertinent test results  History of Anesthesia Complications Negative for: history of anesthetic complications  Airway Mallampati: I   Neck ROM: Full    Dental  (+) Upper Dentures, Lower Dentures   Pulmonary former smoker (quit 2022)   Pulmonary exam normal breath sounds clear to auscultation       Cardiovascular hypertension, + CAD (s/p stent on Eliquis), + Past MI, + Peripheral Vascular Disease and +CHF (ICM, EF 40-45%)  Normal cardiovascular exam Rhythm:Regular Rate:Normal  ECG 06/01/22: NSR, LAD   Neuro/Psych negative neurological ROS     GI/Hepatic ,GERD  ,,  Endo/Other  diabetes, Type 2, Insulin Dependent    Renal/GU      Musculoskeletal   Abdominal   Peds  Hematology negative hematology ROS (+)   Anesthesia Other Findings   Reproductive/Obstetrics                             Anesthesia Physical Anesthesia Plan  ASA: 3  Anesthesia Plan: General   Post-op Pain Management:    Induction: Intravenous  PONV Risk Score and Plan: 3 and Ondansetron, Dexamethasone, Treatment may vary due to age or medical condition, Propofol infusion and TIVA  Airway Management Planned: Natural Airway  Additional Equipment:   Intra-op Plan:   Post-operative Plan:   Informed Consent: I have reviewed the patients History and Physical, chart, labs and discussed the procedure including the risks, benefits and alternatives for the proposed anesthesia with the patient or authorized representative who has indicated his/her understanding and acceptance.       Plan Discussed with: CRNA  Anesthesia Plan Comments: (LMA/GETA backup discussed.  Patient consented for risks of anesthesia including but not limited to:  - adverse reactions to  medications - damage to eyes, teeth, lips or other oral mucosa - nerve damage due to positioning  - sore throat or hoarseness - damage to heart, brain, nerves, lungs, other parts of body or loss of life  Informed patient about role of CRNA in peri- and intra-operative care.  Patient voiced understanding.)       Anesthesia Quick Evaluation

## 2022-06-04 ENCOUNTER — Encounter: Payer: Self-pay | Admitting: Vascular Surgery

## 2022-06-04 DIAGNOSIS — E1152 Type 2 diabetes mellitus with diabetic peripheral angiopathy with gangrene: Secondary | ICD-10-CM | POA: Diagnosis not present

## 2022-06-04 LAB — CULTURE, BLOOD (ROUTINE X 2): Culture: NO GROWTH

## 2022-06-04 LAB — GLUCOSE, CAPILLARY: Glucose-Capillary: 321 mg/dL — ABNORMAL HIGH (ref 70–99)

## 2022-06-04 MED ORDER — JUVEN PO PACK: 1.0000 | PACK | Freq: Two times a day (BID) | ORAL | 11 refills | Status: DC

## 2022-06-04 MED ORDER — OXYCODONE-ACETAMINOPHEN 5-325 MG PO TABS: 1.0000 | ORAL_TABLET | ORAL | 0 refills | Status: DC | PRN

## 2022-06-04 NOTE — Plan of Care (Signed)

## 2022-06-04 NOTE — Inpatient Diabetes Management (Signed)
Inpatient Diabetes Program Recommendations  AACE/ADA: New Consensus Statement on Inpatient Glycemic Control (2015)  Target Ranges:  Prepandial:   less than 140 mg/dL      Peak postprandial:   less than 180 mg/dL (1-2 hours)      Critically ill patients:  140 - 180 mg/dL    Latest Reference Range & Units 06/03/22 08:05 06/03/22 09:53 06/03/22 11:38 06/03/22 15:15 06/03/22 16:38  Glucose-Capillary 70 - 99 mg/dL 161 (H) 096 (H) 045 (H) 115 (H) 112 (H)  (H): Data is abnormally high  Latest Reference Range & Units 06/04/22 07:56  Glucose-Capillary 70 - 99 mg/dL 409 (H)  (H): Data is abnormally high   Current Orders: Semglee 30 units Daily      Novolog 8 units TID   MD- Please add Novolog Sensitive Correction Scale/ SSI (0-9 units) TID AC + HS    --Will follow patient during hospitalization--  Ambrose Finland RN, MSN, CDCES Diabetes Coordinator Inpatient Glycemic Control Team Team Pager: [phone] (8a-5p)

## 2022-06-04 NOTE — Discharge Summary (Signed)
[facility]    Discharge Summary    Patient ID:  Beth Carroll MRN: [id number] DOB/AGE: 1970/05/18 52 y.o.  Admit date: 06/03/2022 Discharge date: 06/04/2022 Date of Surgery: 06/03/2022 Surgeon: Surgeon(s): Dew, Marlow Baars, MD  Admission Diagnosis: Gangrene of right foot [facility]) [I96]  Discharge Diagnoses:  Gangrene of right foot [facility]) [I96]  Secondary Diagnoses: Past Medical History:  Diagnosis Date   Acute deep vein thrombosis (DVT) of right lower extremity (HCC)    ADHD (attention deficit hyperactivity disorder)    AKI (acute kidney injury) (HCC) 04/30/2022   Aortic atherosclerosis (HCC)    COPD (chronic obstructive pulmonary disease) (HCC)    Coronary artery disease 12/15/2020   a.) LHC/PCI 12/15/2020: 99% mLAD (2.25 x 18 mm Onyx Frontier DES), 90% RI (2.75 x 22 mm Onyx Frontier DES), 40% m-dLAD   Diabetes mellitus without complication (HCC)    Diabetes mellitus, type 2 (HCC)    GERD (gastroesophageal reflux disease)    HFrEF (heart failure with reduced ejection fraction) (HCC)    a.) TTE 12/10/2020: EF 40-45%, apical/periapical HK, G2DD; b.) LHC 12/15/2020: EF 35-45%; c.) TTE 06/26/2021: EF 40-45%, LVH, G2DD; d.) TTE 03/21/2022: EF 55-60%, LV/apical sep/ant wall HK, Triv MR, AoV sclerosis   High anion gap metabolic acidosis 04/30/2022   Hyperlipemia    Hypertension    Hypophosphatemia 04/30/2022   Ischemic cardiomyopathy    a.) TTE 12/10/2020: EF 40-45%; b.) LHC 12/15/2020: EF 35-45%; c.) TTE 06/26/2021: EF 40-45%; d.) TTE 03/21/2022: EF 55-60%   Meningioma (HCC)    a.) CT head and MRI brain 08/21/2021: LEFT parietal meningioma   Myocardial infarction (HCC)    Nausea vomiting and diarrhea 04/30/2022   Orthostatic hypotension    PAD (peripheral artery disease) (HCC)    Sepsis secondary to UTI (HCC) 04/30/2022   Tobacco use     Procedure(s): AMPUTATION RAY ( 1ST AND 2ND TOE)  Discharged Condition: good  HPI:  Patient is now status postop  day 1 right first and second toe metatarsal head amputation.  Patient is recovering as expected.  2 out of 10 pain to her right foot is endorsed by the patient.  No complaints overnight.  Patient vitals all remained stable.  Patient discharged to home.  Hospital Course:  Amba Carroll is a 52 y.o. female is S/P Right Extubated: POD # 0 Physical Exam:  Alert notes x3, no acute distress Face: Symmetrical.  Tongue is midline. Neck: Trachea is midline.  No swelling or bruising. Cardiovascular: Regular rate and rhythm Pulmonary: Clear to auscultation bilaterally Abdomen: Soft, nontender, nondistended Left lower extremity: Thigh soft.  Calf soft.  Extremities warm distally toes.  Hard to palpate pedal pulses however the foot is warm is her good capillary refill. Right lower extremity: Thigh soft.  Calf soft.  Extremities warm. Toes amputated.  Hard to palpate pedal pulses however the foot is warm is her good capillary refill. Neurological: No deficits noted   Post-op wounds:  clean, dry, intact or healing well  Pt. Ambulating, voiding and taking PO diet without difficulty. Pt pain controlled with PO pain meds.  Labs:  As below  Complications: none  Consults:    Significant Diagnostic Studies: CBC Lab Results  Component Value Date   WBC 8.1 06/01/2022   HGB 9.5 (L) 06/03/2022   HCT 28.0 (L) 06/03/2022   MCV 84.1 06/01/2022   PLT 286 06/01/2022    BMET    Component Value Date/Time   NA 139 06/03/2022 0826  NA 137 03/18/2021 1541   K 3.0 (L) 06/03/2022 0826   CL 98 06/03/2022 0826   CO2 27 06/01/2022 1413   GLUCOSE 354 (H) 06/03/2022 0826   BUN 5 (L) 06/03/2022 0826   BUN 15 03/18/2021 1541   CREATININE 0.30 (L) 06/03/2022 0826   CALCIUM 8.7 (L) 06/01/2022 1413   GFRNONAA >60 06/01/2022 1413   COAG Lab Results  Component Value Date   INR 1.5 (H) 08/19/2021   INR 1.2 12/09/2020     Disposition:  Discharge to :Home  Allergies as of 06/04/2022   No Known  Allergies      Medication List     TAKE these medications    acetaminophen 650 MG CR tablet Commonly known as: TYLENOL Take 650 mg by mouth every 8 (eight) hours as needed for pain.   albuterol 108 (90 Base) MCG/ACT inhaler Commonly known as: VENTOLIN HFA Inhale 2 puffs into the lungs every 6 (six) hours as needed for wheezing or shortness of breath.   apixaban 5 MG Tabs tablet Commonly known as: Eliquis Take 1 tablet (5 mg total) by mouth 2 (two) times daily.   aspirin EC 81 MG tablet Take 81 mg by mouth daily. Swallow whole.   atorvastatin 80 MG tablet Commonly known as: LIPITOR Take 1 tablet (80 mg total) by mouth daily.   barrier cream Crea Commonly known as: non-specified Apply 1 Application topically 2 (two) times daily as needed.   calcium carbonate 500 MG chewable tablet Commonly known as: TUMS - dosed in mg elemental calcium Chew 1 tablet (200 mg of elemental calcium total) by mouth 3 (three) times daily before meals.   cefpodoxime 200 MG tablet Commonly known as: VANTIN Take 1 tablet (200 mg total) by mouth 2 (two) times daily for 7 days.   CENTRUM SILVER 50+WOMEN PO Take 1 tablet by mouth daily.   clopidogrel 75 MG tablet Commonly known as: PLAVIX Take 1 tablet (75 mg total) by mouth daily.   feeding supplement Liqd Take 237 mLs by mouth 3 (three) times daily between meals.   nutrition supplement (JUVEN) Pack Take 1 packet by mouth 2 (two) times daily between meals.   furosemide 20 MG tablet Commonly known as: LASIX Take 20 mg by mouth daily.   insulin aspart 100 UNIT/ML injection Commonly known as: novoLOG Inject 8 Units into the skin 3 (three) times daily before meals.   Lantus SoloStar 100 UNIT/ML Solostar Pen Generic drug: insulin glargine Inject 30 Units into the skin at bedtime.   oxyCODONE-acetaminophen 5-325 MG tablet Commonly known as: PERCOCET/ROXICET Take 1-2 tablets by mouth every 4 (four) hours as needed for moderate pain.    pantoprazole 40 MG tablet Commonly known as: PROTONIX Take 1 tablet (40 mg total) by mouth daily.   tiotropium 18 MCG inhalation capsule Commonly known as: SPIRIVA Place 1 capsule into inhaler and inhale daily.   Vitamin D (Ergocalciferol) 1.25 MG (50000 UNIT) Caps capsule Commonly known as: DRISDOL Take 50,000 Units by mouth once a week.       Verbal and written Discharge instructions given to the patient. Wound care per Discharge AVS  Follow-up Information     Georgiana Spinner, NP Follow up in 3 week(s).   Specialty: Vascular Surgery Contact information: [address] [place] [address] [phone]                 Signed: Marcie Bal, NP  06/04/2022, 3:55 PM

## 2022-06-06 LAB — CULTURE, BLOOD (ROUTINE X 2)
Culture: NO GROWTH
Special Requests: ADEQUATE
Special Requests: ADEQUATE

## 2022-06-07 ENCOUNTER — Ambulatory Visit: Payer: Medicaid Other | Admitting: Physician Assistant

## 2022-06-07 LAB — SURGICAL PATHOLOGY

## 2022-06-14 ENCOUNTER — Encounter: Payer: Medicaid Other | Admitting: Physician Assistant

## 2022-06-14 DIAGNOSIS — E11621 Type 2 diabetes mellitus with foot ulcer: Secondary | ICD-10-CM | POA: Diagnosis not present

## 2022-06-22 ENCOUNTER — Encounter: Payer: Medicaid Other | Admitting: Physician Assistant

## 2022-06-22 DIAGNOSIS — E11621 Type 2 diabetes mellitus with foot ulcer: Secondary | ICD-10-CM | POA: Diagnosis not present

## 2022-06-22 NOTE — Progress Notes (Signed)
[name], Beth Carroll ([id number]) [id number] Page 1 of 2 Visit Report for 06/22/2022 Chief Complaint Document Details Patient Name: Date of Service: [name], [name] [facility] 06/22/2022 9:00 A M Medical Record Number: [id number] Patient Account Number: [id number] Date of Birth/Sex: Treating RN: 1970/06/17 (52 y.o. [name] Primary Care Provider: Marcelino Duster Other Clinician: Betha Loa Referring Provider: Treating Provider/Extender: Joylene Grapes in Treatment: 6 Information Obtained from: Patient Chief Complaint Bilateral LE Ulcers Electronic Signature(s) Signed: 06/22/2022 8:58:50 AM By: Allen Derry PA-C Entered By: Allen Derry on 06/22/2022 08:58:50 -------------------------------------------------------------------------------- Problem List Details Patient Name: Date of Service: [name], [name] [facility] 06/22/2022 9:00 A M Medical Record Number: [id number] Patient Account Number: [id number] Date of Birth/Sex: Treating RN: 1970/07/21 (52 y.o. [name] Primary Care Provider: Marcelino Duster Other Clinician: Betha Loa Referring Provider: Treating Provider/Extender: Joylene Grapes in Treatment: 6 Active Problems ICD-10 Encounter Code Description Active Date MDM Diagnosis I73.89 Other specified peripheral vascular diseases 05/06/2022 No Yes L97.512 Non-pressure chronic ulcer of other part of right foot with fat 05/06/2022 No Yes layer exposed L97.522 Non-pressure chronic ulcer of other part of left foot with fat 05/06/2022 No Yes layer exposed L89.152 Pressure ulcer of sacral region, stage 2 05/06/2022 No Yes L89.616 Pressure-induced deep tissue damage of right heel 05/06/2022 No Yes L89.626 Pressure-induced deep tissue damage of left heel 05/06/2022 No Yes E11.621 Type 2 diabetes mellitus with foot ulcer 05/06/2022 No Yes I50.42 Chronic combined systolic (congestive) and diastolic 05/06/2022 No Yes (congestive) heart  failure [name], Beth Carroll ([id number]) [id number] Page 2 of 2 I10 Essential (primary) hypertension 05/06/2022 No Yes Inactive Problems Resolved Problems Electronic Signature(s) Signed: 06/22/2022 8:58:47 AM By: Allen Derry PA-C Entered By: Allen Derry on 06/22/2022 08:58:47

## 2022-06-24 ENCOUNTER — Telehealth (INDEPENDENT_AMBULATORY_CARE_PROVIDER_SITE_OTHER): Payer: Self-pay | Admitting: Vascular Surgery

## 2022-06-24 ENCOUNTER — Ambulatory Visit (INDEPENDENT_AMBULATORY_CARE_PROVIDER_SITE_OTHER): Payer: Medicaid Other | Admitting: Nurse Practitioner

## 2022-06-24 ENCOUNTER — Other Ambulatory Visit (INDEPENDENT_AMBULATORY_CARE_PROVIDER_SITE_OTHER): Payer: Self-pay | Admitting: Nurse Practitioner

## 2022-06-24 NOTE — Telephone Encounter (Signed)
Pt made aware and states understanding. 

## 2022-06-24 NOTE — Telephone Encounter (Signed)
Spoke with pt regarding the cancellation of 7.9.24 appt with Dr. Wyn Quaker (he is going to be in surgery). Pt has an appt with [place] tomorrow and then again on Tuesday with Dr. Wyn Quaker. I cancelled the appt on 7.9.24 and pt and provider Sheppard Plumber, NP) will discuss next steps for after appt tomorrow. Pt acknowledged. Pt has requested a refill of her oxycodone to be sent to [facility] in [place], [place]. Please advise patient.

## 2022-06-24 NOTE — Telephone Encounter (Signed)
We can talk about her pain medication tomorrow because it appears she's getting different medications from different providers

## 2022-06-25 ENCOUNTER — Ambulatory Visit (INDEPENDENT_AMBULATORY_CARE_PROVIDER_SITE_OTHER): Payer: Medicaid Other | Admitting: Nurse Practitioner

## 2022-06-25 ENCOUNTER — Encounter (INDEPENDENT_AMBULATORY_CARE_PROVIDER_SITE_OTHER): Payer: Self-pay | Admitting: Nurse Practitioner

## 2022-06-25 VITALS — BP 72/57 | HR 73 | Resp 18

## 2022-06-25 DIAGNOSIS — I96 Gangrene, not elsewhere classified: Secondary | ICD-10-CM

## 2022-06-25 DIAGNOSIS — Z794 Long term (current) use of insulin: Secondary | ICD-10-CM

## 2022-06-25 DIAGNOSIS — E118 Type 2 diabetes mellitus with unspecified complications: Secondary | ICD-10-CM

## 2022-06-25 DIAGNOSIS — E861 Hypovolemia: Secondary | ICD-10-CM

## 2022-06-25 MED ORDER — DOXYCYCLINE HYCLATE 100 MG PO CAPS: 100.0000 mg | ORAL_CAPSULE | Freq: Two times a day (BID) | ORAL | 0 refills | Status: DC

## 2022-06-25 MED ORDER — OXYCODONE-ACETAMINOPHEN 5-325 MG PO TABS: 1.0000 | ORAL_TABLET | Freq: Four times a day (QID) | ORAL | 0 refills | Status: DC | PRN

## 2022-06-25 NOTE — Progress Notes (Signed)
Subjective:    Patient ID: Beth Carroll, female    DOB: 03-Jun-1970, 52 y.o.   MRN: [id number] Chief Complaint  Patient presents with   Follow-up    Beth Carroll is a 52 year old female who underwent amputation of the first and second toe on 06/03/2022.  She notes that she has had some bloody drainage from the incision area.  The incision itself is intact with no dehiscence noted.  There is some areas of damp tissue which appear to be somewhat macerated.  The patient has a very notably swollen foot and endorses that she probably has it dependent a good portion of the day.  She also has heel ulcers that are currently being treated via wound care.  Also of note today her blood pressure is low however the patient does appear to be mentating well and not having any significant spells of dizziness, confusion, lightheadedness or pre syncope.    Review of Systems  Cardiovascular:  Positive for leg swelling.  Skin:  Positive for wound.  Neurological:  Positive for weakness.  All other systems reviewed and are negative.      Objective:   Physical Exam Vitals reviewed.  HENT:     Head: Normocephalic.  Cardiovascular:     Rate and Rhythm: Normal rate.  Pulmonary:     Effort: Pulmonary effort is normal.  Musculoskeletal:     Right lower leg: Edema present.  Skin:    General: Skin is warm and dry.  Neurological:     Mental Status: She is alert and oriented to person, place, and time.     Motor: Weakness present.     Gait: Gait abnormal.  Psychiatric:        Mood and Affect: Mood normal.        Behavior: Behavior normal.        Thought Content: Thought content normal.        Judgment: Judgment normal.     BP (!) 72/57 (BP Location: Right Arm)   Pulse 73   Resp 18   LMP 01/26/2015   Past Medical History:  Diagnosis Date   Acute deep vein thrombosis (DVT) of right lower extremity (HCC)    ADHD (attention deficit hyperactivity disorder)    AKI (acute kidney injury) (HCC)  04/30/2022   Aortic atherosclerosis (HCC)    COPD (chronic obstructive pulmonary disease) (HCC)    Coronary artery disease 12/15/2020   a.) LHC/PCI 12/15/2020: 99% mLAD (2.25 x 18 mm Onyx Frontier DES), 90% RI (2.75 x 22 mm Onyx Frontier DES), 40% m-dLAD   Diabetes mellitus without complication (HCC)    Diabetes mellitus, type 2 (HCC)    GERD (gastroesophageal reflux disease)    HFrEF (heart failure with reduced ejection fraction) (HCC)    a.) TTE 12/10/2020: EF 40-45%, apical/periapical HK, G2DD; b.) LHC 12/15/2020: EF 35-45%; c.) TTE 06/26/2021: EF 40-45%, LVH, G2DD; d.) TTE 03/21/2022: EF 55-60%, LV/apical sep/ant wall HK, Triv MR, AoV sclerosis   High anion gap metabolic acidosis 04/30/2022   Hyperlipemia    Hypertension    Hypophosphatemia 04/30/2022   Ischemic cardiomyopathy    a.) TTE 12/10/2020: EF 40-45%; b.) LHC 12/15/2020: EF 35-45%; c.) TTE 06/26/2021: EF 40-45%; d.) TTE 03/21/2022: EF 55-60%   Meningioma (HCC)    a.) CT head and MRI brain 08/21/2021: LEFT parietal meningioma   Myocardial infarction (HCC)    Nausea vomiting and diarrhea 04/30/2022   Orthostatic hypotension    PAD (peripheral artery disease) (HCC)  Sepsis secondary to UTI (HCC) 04/30/2022   Tobacco use     Social History   Socioeconomic History   Marital status: Single    Spouse name: Not on file   Number of children: Not on file   Years of education: Not on file   Highest education level: Not on file  Occupational History   Not on file  Tobacco Use   Smoking status: Former    Types: Cigarettes    Quit date: 12/09/2020    Years since quitting: 1.5   Smokeless tobacco: Never  Vaping Use   Vaping Use: Never used  Substance and Sexual Activity   Alcohol use: Never   Drug use: Not on file   Sexual activity: Not on file  Other Topics Concern   Not on file  Social History Narrative   Not on file   Social Determinants of Health   Financial Resource Strain: Not on file  Food Insecurity: No  Food Insecurity (04/22/2022)   Hunger Vital Sign    Worried About Running Out of Food in the Last Year: Never true    Ran Out of Food in the Last Year: Never true  Transportation Needs: No Transportation Needs (04/22/2022)   PRAPARE - Administrator, Civil Service (Medical): No    Lack of Transportation (Non-Medical): No  Physical Activity: Not on file  Stress: Not on file  Social Connections: Not on file  Intimate Partner Violence: Not At Risk (04/22/2022)   Humiliation, Afraid, Rape, and Kick questionnaire    Fear of Current or Ex-Partner: No    Emotionally Abused: No    Physically Abused: No    Sexually Abused: No    Past Surgical History:  Procedure Laterality Date   AMPUTATION Right 06/03/2022   Procedure: AMPUTATION RAY ( 1ST AND 2ND TOE);  Surgeon: Annice Needy, MD;  Location: [facility];  Service: Vascular;  Laterality: Right;   CHOLECYSTECTOMY     CORONARY STENT INTERVENTION N/A 12/15/2020   Procedure: CORONARY STENT INTERVENTION;  Surgeon: Iran Ouch, MD;  Location: [facility];  Service: Cardiovascular;  Laterality: N/A;   LEFT HEART CATH AND CORONARY ANGIOGRAPHY N/A 12/15/2020   Procedure: LEFT HEART CATH AND CORONARY ANGIOGRAPHY;  Surgeon: Iran Ouch, MD;  Location: [facility];  Service: Cardiovascular;  Laterality: N/A;   LOWER EXTREMITY ANGIOGRAPHY Left 12/10/2020   Procedure: Lower Extremity Angiography;  Surgeon: Annice Needy, MD;  Location: [facility];  Service: Cardiovascular;  Laterality: Left;   LOWER EXTREMITY ANGIOGRAPHY Right 12/25/2020   Procedure: LOWER EXTREMITY ANGIOGRAPHY;  Surgeon: Annice Needy, MD;  Location: [facility];  Service: Cardiovascular;  Laterality: Right;   LOWER EXTREMITY ANGIOGRAPHY Right 08/19/2021   Procedure: Lower Extremity Angiography;  Surgeon: Annice Needy, MD;  Location: [facility];  Service: Cardiovascular;  Laterality: Right;   LOWER EXTREMITY ANGIOGRAPHY Right  08/19/2021   Procedure: Lower Extremity Angiography;  Surgeon: Annice Needy, MD;  Location: [facility];  Service: Cardiovascular;  Laterality: Right;   LOWER EXTREMITY ANGIOGRAPHY Right 04/24/2022   Procedure: Lower Extremity Angiography;  Surgeon: Learta Codding, MD;  Location: [facility];  Service: Cardiovascular;  Laterality: Right;   ORIF FEMUR FRACTURE Right 12/14/2021   Procedure: OPEN REDUCTION INTERNAL FIXATION RIGHT DISTAL FEMUR;  Surgeon: Roby Lofts, MD;  Location: [facility];  Service: Orthopedics;  Laterality: Right;    Family History  Problem Relation  Age of Onset   Hypertension Father     No Known Allergies     Latest Ref Rng & Units 06/03/2022    8:26 AM 06/01/2022    2:13 PM 04/28/2022    9:03 AM  CBC  WBC 4.0 - 10.5 K/uL  8.1  4.9   Hemoglobin 12.0 - 15.0 g/dL 9.5  9.2  8.3   Hematocrit 36.0 - 46.0 % 28.0  31.1  28.0   Platelets 150 - 400 K/uL  286  232       CMP     Component Value Date/Time   NA 139 06/03/2022 0826   NA 137 03/18/2021 1541   K 3.0 (L) 06/03/2022 0826   CL 98 06/03/2022 0826   CO2 27 06/01/2022 1413   GLUCOSE 354 (H) 06/03/2022 0826   BUN 5 (L) 06/03/2022 0826   BUN 15 03/18/2021 1541   CREATININE 0.30 (L) 06/03/2022 0826   CALCIUM 8.7 (L) 06/01/2022 1413   PROT 6.6 06/01/2022 1413   ALBUMIN 2.9 (L) 06/01/2022 1413   AST 18 06/01/2022 1413   ALT 14 06/01/2022 1413   ALKPHOS 93 06/01/2022 1413   BILITOT 0.5 06/01/2022 1413   GFRNONAA >60 06/01/2022 1413     VAS [place] ABI WITH/WO TBI  Result Date: 05/31/2022  LOWER EXTREMITY DOPPLER STUDY Patient Name:  JOREE KINZLER  Date of Exam:   05/27/2022 Medical Rec #: [id number]      Accession #:    [id number] Date of Birth: [birth year]      Patient Gender: F Patient Age:   89 years Exam Location:  [facility] Procedure:      VAS [place] ABI WITH/WO TBI Referring Phys: Festus Barren --------------------------------------------------------------------------------  Indications:  Claudication, rest pain, and peripheral artery disease. High Risk Factors: Hypertension, coronary artery disease.  Vascular Interventions: 12/25/2020 right SFA DCPTA and stent                          08/19/2021: Aortogram and Selective Right Lower                         Extremity Angiogram. Catheter directed thrombolytic                         therapy with a total of 12 mg of TPA to the Right SFA,                         Popliteal Artery Tib/Peroneal Trunk and Proximal                         Posterior Tibial Artery and placement of a 135 cm total                         length 50 cm working length catheter for continuous                         thrombolytic therapy. Mechanical Thrombectomy to the                         Right SFA and Popliteal Artery with the [place] Rex device.  PTA of the Right SFA and Popliteal Artery with 2                         inflations with a 5 mm diameter by 30 cm length Lutonix                         drug coated angioplasty balloon.                          08/19/2021: Right Lower Extremity Angiogram. Mechanical                         Thrombectomy of the Right SFA, Popliteal Artery,                         Tibioperoneal Trunk, Proximal Posterior Tibial Artery                         using the penumbra CAT 6 device. Stent placement to the                         Right Popliteal Artery wit 5 mm diameter by 7.5 cm                         length Viabahn stent. PTA of the Right Posterior Tibial                         Artery with 2.5 mm diameter by 22 cm length angioplasty                         balloon. Stent placement to the Right SFA with a 6 mm                         diameter by 2.5 cm length Viabahn stent.                          04/24/2022: Aortogram and Selective Right Lower                         Extremity Angiogram. PTA of the Right Posterior Tibial                         Artery 2.5 mm. Penumbra aspiration thrombectomy of Right                          CFA, SFA distribution, Popliteal as well Right PFA. 6 mm                         drug eluting PTA of the Right SFA Origin and CFA. 3 mm                         PTA of the Right DFA Origin. Comparison Study: 02/02/2022 Performing Technologist: Debbe Bales RVS  Examination Guidelines: A complete evaluation includes at minimum, Doppler waveform signals and systolic blood pressure reading at the level of bilateral brachial, anterior tibial, and posterior tibial arteries, when  vessel segments are accessible. Bilateral testing is considered an integral part of a complete examination. Photoelectric Plethysmograph (PPG) waveforms and toe systolic pressure readings are included as required and additional duplex testing as needed. Limited examinations for reoccurring indications may be performed as noted.  ABI Findings: +---------+------------------+-----+---------+---------------+ Right    Rt Pressure (mmHg)IndexWaveform Comment         +---------+------------------+-----+---------+---------------+ Brachial 80                                              +---------+------------------+-----+---------+---------------+ ATA      119               1.49 triphasic                +---------+------------------+-----+---------+---------------+ PTA      97                1.21 biphasic                 +---------+------------------+-----+---------+---------------+ Great Toe                                Gangrenous Toes +---------+------------------+-----+---------+---------------+ +---------+------------------+-----+---------+-------+ Left     Lt Pressure (mmHg)IndexWaveform Comment +---------+------------------+-----+---------+-------+ Brachial 78                                      +---------+------------------+-----+---------+-------+ ATA      109               1.36 triphasic        +---------+------------------+-----+---------+-------+ PTA      120               1.50  triphasic        +---------+------------------+-----+---------+-------+ Great Toe110               1.38 Normal           +---------+------------------+-----+---------+-------+ +-------+-----------+------------+------------+------------+ ABI/TBIToday's ABIToday's TBI Previous ABIPrevious TBI +-------+-----------+------------+------------+------------+ Right  1.49       Not Obtained1.17        .90          +-------+-----------+------------+------------+------------+ Left   1.50       1.38        1.12        .93          +-------+-----------+------------+------------+------------+ Bilateral ABIs appear essentially unchanged compared to prior study on 02/02/2022. Left TBIs appear increased compared to prior study on 02/02/2022.  Summary: Right: Resting right ankle-brachial index is within normal range. Left: Resting left ankle-brachial index is within normal range. The left toe-brachial index is normal. *See table(s) above for measurements and observations.   Electronically signed by Festus Barren MD on 05/31/2022 at 9:22:37 AM.    Final        Assessment & Plan:   1. Gangrene of right foot (HCC) The patient amputation site has some area of drainage and has had some bleeding.  The foot is significantly swollen and I suspect that much of the bleeding she is seeing is actually serous drainage.  I have instructed the patient to elevate her foot is much as possible today to help with the swelling which will also help with wound healing.  We will  not remove any sutures today.  Also given the significant drainage we will also send in an antibiotic just to make sure there is no underlying infection.  Will also refill the patient's pain medication today.  2. Controlled type 2 diabetes mellitus with complication, with long-term current use of insulin (HCC)  Continue hypoglycemic medications as already ordered, these medications have been reviewed and there are no changes at this time.  Hgb A1C  to be monitored as already arranged by primary service  4. Hypotension due to hypovolemia The patient has been having some issues with hypotension.  This was present the last time I had an office visit the patient.  Patient is advised that if she feels poorly such as with dizziness, fatigue or confusion she may wish to present to the emergency room for further evaluation.   Current Outpatient Medications on File Prior to Visit  Medication Sig Dispense Refill   acetaminophen (TYLENOL) 650 MG CR tablet Take 650 mg by mouth every 8 (eight) hours as needed for pain.     albuterol (VENTOLIN HFA) 108 (90 Base) MCG/ACT inhaler Inhale 2 puffs into the lungs every 6 (six) hours as needed for wheezing or shortness of breath.     apixaban (ELIQUIS) 5 MG TABS tablet Take 1 tablet (5 mg total) by mouth 2 (two) times daily. 60 tablet 3   aspirin EC 81 MG tablet Take 81 mg by mouth daily. Swallow whole.     atorvastatin (LIPITOR) 80 MG tablet Take 1 tablet (80 mg total) by mouth daily. 30 tablet 2   barrier cream (NON-SPECIFIED) CREA Apply 1 Application topically 2 (two) times daily as needed.     calcium carbonate (TUMS - DOSED IN MG ELEMENTAL CALCIUM) 500 MG chewable tablet Chew 1 tablet (200 mg of elemental calcium total) by mouth 3 (three) times daily before meals.     clopidogrel (PLAVIX) 75 MG tablet Take 1 tablet (75 mg total) by mouth daily. 30 tablet 2   feeding supplement (ENSURE ENLIVE / ENSURE PLUS) LIQD Take 237 mLs by mouth 3 (three) times daily between meals. 237 mL 12   furosemide (LASIX) 20 MG tablet Take 20 mg by mouth daily.     insulin aspart (NOVOLOG) 100 UNIT/ML injection Inject 8 Units into the skin 3 (three) times daily before meals.     insulin glargine (LANTUS SOLOSTAR) 100 UNIT/ML Solostar Pen Inject 30 Units into the skin at bedtime.     Multiple Vitamins-Minerals (CENTRUM SILVER 50+WOMEN PO) Take 1 tablet by mouth daily.     nutrition supplement, JUVEN, (JUVEN) PACK Take 1 packet  by mouth 2 (two) times daily between meals. 60 each 11   pantoprazole (PROTONIX) 40 MG tablet Take 1 tablet (40 mg total) by mouth daily. 30 tablet 0   tiotropium (SPIRIVA) 18 MCG inhalation capsule Place 1 capsule into inhaler and inhale daily.     Vitamin D, Ergocalciferol, (DRISDOL) 1.25 MG (50000 UNIT) CAPS capsule Take 50,000 Units by mouth once a week.     No current facility-administered medications on file prior to visit.    There are no Patient Instructions on file for this visit. No follow-ups on file.   Georgiana Spinner, NP

## 2022-06-26 DIAGNOSIS — I4891 Unspecified atrial fibrillation: Secondary | ICD-10-CM

## 2022-06-26 HISTORY — DX: Unspecified atrial fibrillation: I48.91

## 2022-06-28 ENCOUNTER — Encounter: Payer: Medicaid Other | Attending: Physician Assistant | Admitting: Physician Assistant

## 2022-06-28 DIAGNOSIS — L97512 Non-pressure chronic ulcer of other part of right foot with fat layer exposed: Secondary | ICD-10-CM | POA: Insufficient documentation

## 2022-06-28 DIAGNOSIS — L89616 Pressure-induced deep tissue damage of right heel: Secondary | ICD-10-CM | POA: Diagnosis not present

## 2022-06-28 DIAGNOSIS — L97522 Non-pressure chronic ulcer of other part of left foot with fat layer exposed: Secondary | ICD-10-CM | POA: Insufficient documentation

## 2022-06-28 DIAGNOSIS — E1151 Type 2 diabetes mellitus with diabetic peripheral angiopathy without gangrene: Secondary | ICD-10-CM | POA: Insufficient documentation

## 2022-06-28 DIAGNOSIS — E11621 Type 2 diabetes mellitus with foot ulcer: Secondary | ICD-10-CM | POA: Insufficient documentation

## 2022-06-28 DIAGNOSIS — I110 Hypertensive heart disease with heart failure: Secondary | ICD-10-CM | POA: Insufficient documentation

## 2022-06-28 DIAGNOSIS — L89152 Pressure ulcer of sacral region, stage 2: Secondary | ICD-10-CM | POA: Insufficient documentation

## 2022-06-28 DIAGNOSIS — L89626 Pressure-induced deep tissue damage of left heel: Secondary | ICD-10-CM | POA: Diagnosis not present

## 2022-06-28 DIAGNOSIS — I5042 Chronic combined systolic (congestive) and diastolic (congestive) heart failure: Secondary | ICD-10-CM | POA: Insufficient documentation

## 2022-06-28 NOTE — Progress Notes (Signed)
[place], Beth Carroll ([id number]) [id number] Page 1 of 4 Visit Report for 06/28/2022 Chief Complaint Document Details Patient Name: Date of Service: Beth Carroll, Beth Carroll [facility] 06/28/2022 12:00 PM Medical Record Number: [id number] Patient Account Number: [id number] Date of Birth/Sex: Treating RN: 06-07-1970 (52 y.o. [name] Primary Care Provider: Marcelino Duster Other Clinician: Referring Provider: Treating Provider/Extender: Joylene Grapes in Treatment: 7 Information Obtained from: Patient Chief Complaint Bilateral LE Ulcers Electronic Signature(s) Signed: 06/28/2022 12:37:55 PM By: Allen Derry PA-C Entered By: Allen Derry on 06/28/2022 12:37:55 -------------------------------------------------------------------------------- Physician Orders Details Patient Name: Date of Service: Beth Carroll, Beth Carroll [facility] 06/28/2022 12:00 PM Medical Record Number: [id number] Patient Account Number: [id number] Date of Birth/Sex: Treating RN: 1970-10-13 (52 y.o. [name] Primary Care Provider: Marcelino Duster Other Clinician: Referring Provider: Treating Provider/Extender: Joylene Grapes in Treatment: 7 Verbal / Phone Orders: No Diagnosis Coding ICD-10 Coding Code Description I73.89 Other specified peripheral vascular diseases L97.512 Non-pressure chronic ulcer of other part of right foot with fat layer exposed L97.522 Non-pressure chronic ulcer of other part of left foot with fat layer exposed L89.152 Pressure ulcer of sacral region, stage 2 L89.616 Pressure-induced deep tissue damage of right heel L89.626 Pressure-induced deep tissue damage of left heel 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 Nurse Visit as needed Home Health Home Health Company: -  [facility] CONTINUE Home Health for wound care. May utilize formulary equivalent dressing for wound treatment orders unless otherwise specified. Home Health Nurse may visit PRN to address patients wound care needs. Scheduled days for dressing changes to be completed; exception, patient has scheduled wound care visit that day. **Please direct any NON-WOUND related issues/requests for orders to patient's Primary Care Physician. **If current dressing causes regression in wound condition, may D/C ordered dressing product/s and apply Normal Saline Moist Dressing daily until next Wound Healing Center or Other MD appointment. **Notify Wound Healing Center of regression in wound condition at [phone]. Other Home Health Orders/Instructions: - please see patient on Wednesday and Friday for dressing changes. Brace for left foot wound need to be ordered by PCP for [facility]. Left heel healed, give it another week to toughen up until you start putting pressure on area. Bathing/ [employer] wounds with antibacterial soap and water. - dial soap recommended May shower; gently cleanse wound with antibacterial soap, rinse and pat dry prior to dressing wounds No tub bath. Anesthetic (Use 'Patient Medications' Section for Anesthetic Order Entry) Lidocaine applied to wound bed Beth Carroll, Beth Carroll ([id number]) [id number] Page 2 of 4 Edema Control - Lymphedema / Segmental Compressive Device / Other Elevate, Exercise Daily and A void Standing for Long Periods of Time. Elevate legs to the level of the heart and pump ankles as often as possible Elevate leg(s) parallel to the floor when sitting. DO YOUR BEST to sleep in the bed at night. DO NOT sleep in your recliner. Long hours of sitting in a recliner leads to swelling of the legs and/or potential wounds on your backside. Off-Loading Turn and reposition every 2 hours - keep pressure off sacrum Wound Treatment Wound #5 - Calcaneus Wound  Laterality: Right Cleanser: Soap and Water 3 x Per Week/30 Days Discharge Instructions: Gently cleanse wound with antibacterial soap, rinse and pat dry prior to dressing wounds Prim Dressing: Hydrofera Blue Ready Transfer Foam, 2.5x2.5 (in/in) 3 x Per Week/30 Days ary Discharge Instructions: Apply Hydrofera  Blue Ready to wound bed as directed Secondary Dressing: ABD Pad 5x9 (in/in) 3 x Per Week/30 Days Discharge Instructions: Cover with ABD pad Secured With: Medipore T - 79M Medipore H Soft Cloth Surgical T ape ape, 2x2 (in/yd) 3 x Per Week/30 Days Secured With: [employer] or Non-Sterile 6-ply 4.5x4 (yd/yd) 3 x Per Week/30 Days Discharge Instructions: Apply Kerlix as directed Wound #9 - Amputation Site - Toe Wound Laterality: Right Cleanser: Soap and Water 1 x Per Day/30 Days Discharge Instructions: Gently cleanse wound with antibacterial soap, rinse and pat dry prior to dressing wounds Cleanser: Wound Cleanser 1 x Per Day/30 Days Discharge Instructions: Wash your hands with soap and water. Remove old dressing, discard into plastic bag and place into trash. Cleanse the wound with Wound Cleanser prior to applying a clean dressing using gauze sponges, not tissues or cotton balls. Do not scrub or use excessive force. Pat dry using gauze sponges, not tissue or cotton balls. Topical: Betadine 1 x Per Day/30 Days Discharge Instructions: Apply betadine as directed. Prim Dressing: Gauze 1 x Per Day/30 Days ary Discharge Instructions: As directed: dry Secondary Dressing: ABD Pad 5x9 (in/in) 1 x Per Day/30 Days Discharge Instructions: Cover with ABD pad Secured With: Medipore T - 79M Medipore H Soft Cloth Surgical T ape ape, 2x2 (in/yd) 1 x Per Day/30 Days Secured With: [employer] Sterile or Non-Sterile 6-ply 4.5x4 (yd/yd) 1 x Per Day/30 Days Discharge Instructions: Apply Kerlix as directed Electronic Signature(s) Unsigned Entered By: Angelina Pih on 06/28/2022  14:48:03 -------------------------------------------------------------------------------- Problem List Details Patient Name: Date of Service: Beth Carroll, Beth Carroll 06/28/2022 12:00 PM Medical Record Number: [id number] Patient Account Number: [id number] Date of Birth/Sex: Treating RN: [birth year] (52 y.o. [name] Primary Care Provider: Marcelino Duster Other Clinician: Referring Provider: Treating Provider/Extender: Joylene Grapes in Treatment: 7 Active Problems ICD-10 Encounter Code Description Active Date MDM Diagnosis I73.89 Other specified peripheral vascular diseases 05/06/2022 No Yes Beth Carroll, Beth Carroll ([id number]) [id number] Page 3 of 4 [phone] Non-pressure chronic ulcer of other part of right foot with fat layer exposed 05/06/2022 No Yes L97.522 Non-pressure chronic ulcer of other part of left foot with fat layer exposed 05/06/2022 No Yes L89.152 Pressure ulcer of sacral region, stage 2 05/06/2022 No Yes L89.616 Pressure-induced deep tissue damage of right heel 05/06/2022 No Yes L89.626 Pressure-induced deep tissue damage of left heel 05/06/2022 No Yes E11.621 Type 2 diabetes mellitus with foot ulcer 05/06/2022 No Yes I50.42 Chronic combined systolic (congestive) and diastolic (congestive) heart failure 05/06/2022 No Yes I10 Essential (primary) hypertension 05/06/2022 No Yes Inactive Problems Resolved Problems Electronic Signature(s) Signed: 06/28/2022 12:37:45 PM By: Allen Derry PA-C Entered By: Allen Derry on 06/28/2022 12:37:44 -------------------------------------------------------------------------------- SuperBill Details Patient Name: Date of Service: Beth Carroll, Beth Carroll 06/28/2022 Medical Record Number: [id number] Patient Account Number: [id number] Date of Birth/Sex: Treating RN: [birth year] (52 y.o. 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

## 2022-06-29 ENCOUNTER — Ambulatory Visit (INDEPENDENT_AMBULATORY_CARE_PROVIDER_SITE_OTHER): Payer: Medicaid Other | Admitting: Vascular Surgery

## 2022-06-30 ENCOUNTER — Other Ambulatory Visit (INDEPENDENT_AMBULATORY_CARE_PROVIDER_SITE_OTHER): Payer: Self-pay | Admitting: Vascular Surgery

## 2022-07-01 ENCOUNTER — Ambulatory Visit (INDEPENDENT_AMBULATORY_CARE_PROVIDER_SITE_OTHER): Payer: Medicaid Other | Admitting: Nurse Practitioner

## 2022-07-01 ENCOUNTER — Encounter (INDEPENDENT_AMBULATORY_CARE_PROVIDER_SITE_OTHER): Payer: Self-pay

## 2022-07-01 NOTE — Telephone Encounter (Signed)
This medication should be followed by pt's PCP

## 2022-07-01 NOTE — Telephone Encounter (Signed)
Should be followed by pcp

## 2022-07-05 ENCOUNTER — Inpatient Hospital Stay: Payer: Medicaid Other

## 2022-07-05 ENCOUNTER — Encounter: Payer: Medicaid Other | Admitting: Physician Assistant

## 2022-07-05 ENCOUNTER — Inpatient Hospital Stay
Admission: EM | Admit: 2022-07-05 | Discharge: 2022-07-14 | DRG: 629 | Disposition: A | Discharge status: Home Health | Payer: Medicaid Other | Source: Ambulatory Visit | Attending: Osteopathic Medicine | Admitting: Osteopathic Medicine

## 2022-07-05 ENCOUNTER — Other Ambulatory Visit: Payer: Self-pay

## 2022-07-05 ENCOUNTER — Emergency Department: Payer: Medicaid Other

## 2022-07-05 DIAGNOSIS — I2510 Atherosclerotic heart disease of native coronary artery without angina pectoris: Secondary | ICD-10-CM | POA: Diagnosis present

## 2022-07-05 DIAGNOSIS — I5030 Unspecified diastolic (congestive) heart failure: Secondary | ICD-10-CM | POA: Diagnosis not present

## 2022-07-05 DIAGNOSIS — I110 Hypertensive heart disease with heart failure: Secondary | ICD-10-CM | POA: Diagnosis present

## 2022-07-05 DIAGNOSIS — E1169 Type 2 diabetes mellitus with other specified complication: Principal | ICD-10-CM | POA: Diagnosis present

## 2022-07-05 DIAGNOSIS — E11621 Type 2 diabetes mellitus with foot ulcer: Secondary | ICD-10-CM | POA: Diagnosis present

## 2022-07-05 DIAGNOSIS — L089 Local infection of the skin and subcutaneous tissue, unspecified: Secondary | ICD-10-CM | POA: Diagnosis present

## 2022-07-05 DIAGNOSIS — L97419 Non-pressure chronic ulcer of right heel and midfoot with unspecified severity: Secondary | ICD-10-CM | POA: Diagnosis present

## 2022-07-05 DIAGNOSIS — M79671 Pain in right foot: Secondary | ICD-10-CM | POA: Diagnosis present

## 2022-07-05 DIAGNOSIS — E1151 Type 2 diabetes mellitus with diabetic peripheral angiopathy without gangrene: Secondary | ICD-10-CM | POA: Diagnosis present

## 2022-07-05 DIAGNOSIS — I255 Ischemic cardiomyopathy: Secondary | ICD-10-CM | POA: Diagnosis present

## 2022-07-05 DIAGNOSIS — K219 Gastro-esophageal reflux disease without esophagitis: Secondary | ICD-10-CM | POA: Diagnosis present

## 2022-07-05 DIAGNOSIS — Z8249 Family history of ischemic heart disease and other diseases of the circulatory system: Secondary | ICD-10-CM | POA: Diagnosis not present

## 2022-07-05 DIAGNOSIS — Z794 Long term (current) use of insulin: Secondary | ICD-10-CM | POA: Diagnosis not present

## 2022-07-05 DIAGNOSIS — B9562 Methicillin resistant Staphylococcus aureus infection as the cause of diseases classified elsewhere: Secondary | ICD-10-CM | POA: Diagnosis present

## 2022-07-05 DIAGNOSIS — Z86718 Personal history of other venous thrombosis and embolism: Secondary | ICD-10-CM

## 2022-07-05 DIAGNOSIS — I959 Hypotension, unspecified: Secondary | ICD-10-CM | POA: Diagnosis present

## 2022-07-05 DIAGNOSIS — E440 Moderate protein-calorie malnutrition: Secondary | ICD-10-CM | POA: Diagnosis present

## 2022-07-05 DIAGNOSIS — B965 Pseudomonas (aeruginosa) (mallei) (pseudomallei) as the cause of diseases classified elsewhere: Secondary | ICD-10-CM | POA: Diagnosis present

## 2022-07-05 DIAGNOSIS — Z87891 Personal history of nicotine dependence: Secondary | ICD-10-CM

## 2022-07-05 DIAGNOSIS — E1165 Type 2 diabetes mellitus with hyperglycemia: Secondary | ICD-10-CM | POA: Diagnosis present

## 2022-07-05 DIAGNOSIS — Z955 Presence of coronary angioplasty implant and graft: Secondary | ICD-10-CM

## 2022-07-05 DIAGNOSIS — E1140 Type 2 diabetes mellitus with diabetic neuropathy, unspecified: Secondary | ICD-10-CM | POA: Diagnosis present

## 2022-07-05 DIAGNOSIS — Z681 Body mass index (BMI) 19 or less, adult: Secondary | ICD-10-CM

## 2022-07-05 DIAGNOSIS — Z79899 Other long term (current) drug therapy: Secondary | ICD-10-CM

## 2022-07-05 DIAGNOSIS — L89626 Pressure-induced deep tissue damage of left heel: Secondary | ICD-10-CM | POA: Diagnosis not present

## 2022-07-05 DIAGNOSIS — J449 Chronic obstructive pulmonary disease, unspecified: Secondary | ICD-10-CM | POA: Diagnosis present

## 2022-07-05 DIAGNOSIS — M869 Osteomyelitis, unspecified: Secondary | ICD-10-CM | POA: Diagnosis present

## 2022-07-05 DIAGNOSIS — L97522 Non-pressure chronic ulcer of other part of left foot with fat layer exposed: Secondary | ICD-10-CM | POA: Diagnosis not present

## 2022-07-05 DIAGNOSIS — D649 Anemia, unspecified: Secondary | ICD-10-CM | POA: Diagnosis present

## 2022-07-05 DIAGNOSIS — Z7901 Long term (current) use of anticoagulants: Secondary | ICD-10-CM | POA: Diagnosis not present

## 2022-07-05 DIAGNOSIS — I252 Old myocardial infarction: Secondary | ICD-10-CM

## 2022-07-05 DIAGNOSIS — I10 Essential (primary) hypertension: Secondary | ICD-10-CM | POA: Diagnosis not present

## 2022-07-05 DIAGNOSIS — I5042 Chronic combined systolic (congestive) and diastolic (congestive) heart failure: Secondary | ICD-10-CM | POA: Diagnosis not present

## 2022-07-05 DIAGNOSIS — I4891 Unspecified atrial fibrillation: Secondary | ICD-10-CM | POA: Insufficient documentation

## 2022-07-05 DIAGNOSIS — I739 Peripheral vascular disease, unspecified: Secondary | ICD-10-CM | POA: Diagnosis not present

## 2022-07-05 DIAGNOSIS — Z7902 Long term (current) use of antithrombotics/antiplatelets: Secondary | ICD-10-CM

## 2022-07-05 DIAGNOSIS — T8781 Dehiscence of amputation stump: Secondary | ICD-10-CM | POA: Diagnosis present

## 2022-07-05 DIAGNOSIS — L97512 Non-pressure chronic ulcer of other part of right foot with fat layer exposed: Secondary | ICD-10-CM | POA: Diagnosis not present

## 2022-07-05 DIAGNOSIS — E11628 Type 2 diabetes mellitus with other skin complications: Secondary | ICD-10-CM | POA: Diagnosis not present

## 2022-07-05 DIAGNOSIS — L89152 Pressure ulcer of sacral region, stage 2: Secondary | ICD-10-CM | POA: Diagnosis not present

## 2022-07-05 DIAGNOSIS — M86671 Other chronic osteomyelitis, right ankle and foot: Secondary | ICD-10-CM

## 2022-07-05 DIAGNOSIS — Y835 Amputation of limb(s) as the cause of abnormal reaction of the patient, or of later complication, without mention of misadventure at the time of the procedure: Secondary | ICD-10-CM | POA: Diagnosis present

## 2022-07-05 DIAGNOSIS — I5022 Chronic systolic (congestive) heart failure: Secondary | ICD-10-CM | POA: Diagnosis present

## 2022-07-05 DIAGNOSIS — E785 Hyperlipidemia, unspecified: Secondary | ICD-10-CM | POA: Diagnosis present

## 2022-07-05 DIAGNOSIS — L89616 Pressure-induced deep tissue damage of right heel: Secondary | ICD-10-CM | POA: Diagnosis not present

## 2022-07-05 DIAGNOSIS — S91301A Unspecified open wound, right foot, initial encounter: Secondary | ICD-10-CM

## 2022-07-05 DIAGNOSIS — M21372 Foot drop, left foot: Secondary | ICD-10-CM | POA: Diagnosis present

## 2022-07-05 DIAGNOSIS — Z7982 Long term (current) use of aspirin: Secondary | ICD-10-CM

## 2022-07-05 LAB — CBC WITH DIFFERENTIAL/PLATELET
Abs Immature Granulocytes: 0.02 10*3/uL (ref 0.00–0.07)
Basophils Absolute: 0 10*3/uL (ref 0.0–0.1)
Basophils Relative: 1 %
Eosinophils Absolute: 0.1 10*3/uL (ref 0.0–0.5)
Eosinophils Relative: 2 %
HCT: 32.7 % — ABNORMAL LOW (ref 36.0–46.0)
Hemoglobin: 9.3 g/dL — ABNORMAL LOW (ref 12.0–15.0)
Immature Granulocytes: 0 %
Lymphocytes Relative: 18 %
Lymphs Abs: 1 10*3/uL (ref 0.7–4.0)
MCH: 22.9 pg — ABNORMAL LOW (ref 26.0–34.0)
MCHC: 28.4 g/dL — ABNORMAL LOW (ref 30.0–36.0)
MCV: 80.5 fL (ref 80.0–100.0)
Monocytes Absolute: 0.3 10*3/uL (ref 0.1–1.0)
Monocytes Relative: 5 %
Neutro Abs: 4.2 10*3/uL (ref 1.7–7.7)
Neutrophils Relative %: 74 %
Platelets: 281 10*3/uL (ref 150–400)
RBC: 4.06 MIL/uL (ref 3.87–5.11)
RDW: 14.4 % (ref 11.5–15.5)
WBC: 5.6 10*3/uL (ref 4.0–10.5)
nRBC: 0 % (ref 0.0–0.2)

## 2022-07-05 LAB — COMPREHENSIVE METABOLIC PANEL
ALT: 16 U/L (ref 0–44)
AST: 21 U/L (ref 15–41)
Albumin: 3.6 g/dL (ref 3.5–5.0)
Alkaline Phosphatase: 104 U/L (ref 38–126)
Anion gap: 8 (ref 5–15)
BUN: 21 mg/dL — ABNORMAL HIGH (ref 6–20)
CO2: 27 mmol/L (ref 22–32)
Calcium: 9.2 mg/dL (ref 8.9–10.3)
Chloride: 99 mmol/L (ref 98–111)
Creatinine, Ser: 0.54 mg/dL (ref 0.44–1.00)
GFR, Estimated: >60 mL/min (ref >60)
Glucose, Bld: 308 mg/dL — ABNORMAL HIGH (ref 70–99)
Potassium: 3.8 mmol/L (ref 3.5–5.1)
Sodium: 134 mmol/L — ABNORMAL LOW (ref 135–145)
Total Bilirubin: 0.7 mg/dL (ref 0.3–1.2)
Total Protein: 7.4 g/dL (ref 6.5–8.1)

## 2022-07-05 LAB — PROCALCITONIN: Procalcitonin: <0.1 ng/mL

## 2022-07-05 LAB — GLUCOSE, CAPILLARY: Glucose-Capillary: 467 mg/dL — ABNORMAL HIGH (ref 70–99)

## 2022-07-05 LAB — TROPONIN I (HIGH SENSITIVITY): Troponin I (High Sensitivity): 8 ng/L (ref <18)

## 2022-07-05 LAB — LACTIC ACID, PLASMA
Lactic Acid, Venous: 1.8 mmol/L (ref 0.5–1.9)
Lactic Acid, Venous: 1.4 mmol/L (ref 0.5–1.9)

## 2022-07-05 LAB — SEDIMENTATION RATE: Sed Rate: 48 mm/hr — ABNORMAL HIGH (ref 0–30)

## 2022-07-05 MED ORDER — ATORVASTATIN CALCIUM 20 MG PO TABS
80.0000 mg | ORAL_TABLET | Freq: Every day | ORAL | Status: DC
  Administered 2022-07-06 – 2022-07-14 (×9): 80 mg via ORAL
  Filled 2022-07-05 (×9): qty 4

## 2022-07-05 MED ORDER — SODIUM CHLORIDE 0.9 % IV BOLUS
1000.0000 mL | Freq: Once | INTRAVENOUS | Status: AC
  Administered 2022-07-05: 1000 mL via INTRAVENOUS

## 2022-07-05 MED ORDER — ONDANSETRON HCL 4 MG PO TABS: 4.0000 mg | ORAL_TABLET | Freq: Four times a day (QID) | ORAL | Status: DC | PRN

## 2022-07-05 MED ORDER — INSULIN ASPART 100 UNIT/ML IJ SOLN
4.0000 [IU] | Freq: Three times a day (TID) | INTRAMUSCULAR | Status: DC
  Administered 2022-07-06 – 2022-07-08 (×8): 4 [IU] via SUBCUTANEOUS
  Filled 2022-07-05 (×8): qty 1

## 2022-07-05 MED ORDER — INSULIN GLARGINE-YFGN 100 UNIT/ML ~~LOC~~ SOLN
20.0000 [IU] | Freq: Every day | SUBCUTANEOUS | Status: DC
  Administered 2022-07-05 – 2022-07-06 (×2): 20 [IU] via SUBCUTANEOUS
  Filled 2022-07-05 (×3): qty 0.2

## 2022-07-05 MED ORDER — INSULIN GLARGINE-YFGN 100 UNIT/ML ~~LOC~~ SOLN
10.0000 [IU] | Freq: Once | SUBCUTANEOUS | Status: AC
  Administered 2022-07-05: 10 [IU] via SUBCUTANEOUS
  Filled 2022-07-05: qty 0.1

## 2022-07-05 MED ORDER — MORPHINE SULFATE (PF) 2 MG/ML IV SOLN: 2.0000 mg | INTRAVENOUS | Status: DC | PRN

## 2022-07-05 MED ORDER — PIPERACILLIN-TAZOBACTAM 3.375 G IVPB 30 MIN
3.3750 g | Freq: Once | INTRAVENOUS | Status: AC
  Administered 2022-07-05: 3.375 g via INTRAVENOUS
  Filled 2022-07-05: qty 50

## 2022-07-05 MED ORDER — ACETAMINOPHEN 325 MG PO TABS: 650.0000 mg | ORAL_TABLET | Freq: Four times a day (QID) | ORAL | Status: DC | PRN

## 2022-07-05 MED ORDER — SODIUM CHLORIDE 0.9 % IV SOLN: INTRAVENOUS | Status: DC

## 2022-07-05 MED ORDER — PANTOPRAZOLE SODIUM 40 MG PO TBEC
40.0000 mg | DELAYED_RELEASE_TABLET | Freq: Every day | ORAL | Status: DC
  Administered 2022-07-06 – 2022-07-14 (×9): 40 mg via ORAL
  Filled 2022-07-05 (×10): qty 1

## 2022-07-05 MED ORDER — INSULIN ASPART 100 UNIT/ML IJ SOLN
0.0000 [IU] | Freq: Every day | INTRAMUSCULAR | Status: DC
  Administered 2022-07-05 – 2022-07-06 (×2): 5 [IU] via SUBCUTANEOUS
  Administered 2022-07-08: 4 [IU] via SUBCUTANEOUS
  Administered 2022-07-11: 2 [IU] via SUBCUTANEOUS
  Administered 2022-07-13: 5 [IU] via SUBCUTANEOUS
  Filled 2022-07-05 (×5): qty 1

## 2022-07-05 MED ORDER — GADOBUTROL 1 MMOL/ML IV SOLN
5.0000 mL | Freq: Once | INTRAVENOUS | Status: AC | PRN
  Administered 2022-07-05: 5 mL via INTRAVENOUS

## 2022-07-05 MED ORDER — APIXABAN 5 MG PO TABS
5.0000 mg | ORAL_TABLET | Freq: Two times a day (BID) | ORAL | Status: DC
  Administered 2022-07-05 – 2022-07-06 (×2): 5 mg via ORAL
  Filled 2022-07-05 (×2): qty 1

## 2022-07-05 MED ORDER — INSULIN ASPART 100 UNIT/ML IJ SOLN
0.0000 [IU] | Freq: Three times a day (TID) | INTRAMUSCULAR | Status: DC
  Administered 2022-07-06: 5 [IU] via SUBCUTANEOUS
  Administered 2022-07-06: 8 [IU] via SUBCUTANEOUS
  Administered 2022-07-07: 11 [IU] via SUBCUTANEOUS
  Administered 2022-07-07: 8 [IU] via SUBCUTANEOUS
  Administered 2022-07-07: 5 [IU] via SUBCUTANEOUS
  Administered 2022-07-08 (×2): 8 [IU] via SUBCUTANEOUS
  Administered 2022-07-09: 5 [IU] via SUBCUTANEOUS
  Administered 2022-07-09 – 2022-07-10 (×2): 11 [IU] via SUBCUTANEOUS
  Administered 2022-07-10: 2 [IU] via SUBCUTANEOUS
  Administered 2022-07-10 – 2022-07-11 (×2): 3 [IU] via SUBCUTANEOUS
  Administered 2022-07-11: 5 [IU] via SUBCUTANEOUS
  Administered 2022-07-12: 3 [IU] via SUBCUTANEOUS
  Administered 2022-07-12: 2 [IU] via SUBCUTANEOUS
  Administered 2022-07-13: 3 [IU] via SUBCUTANEOUS
  Administered 2022-07-14: 8 [IU] via SUBCUTANEOUS
  Filled 2022-07-05 (×18): qty 1

## 2022-07-05 MED ORDER — VANCOMYCIN HCL 1250 MG/250ML IV SOLN
1250.0000 mg | INTRAVENOUS | Status: DC
  Administered 2022-07-06 – 2022-07-08 (×3): 1250 mg via INTRAVENOUS
  Filled 2022-07-05 (×4): qty 250

## 2022-07-05 MED ORDER — ASPIRIN 81 MG PO TBEC
81.0000 mg | DELAYED_RELEASE_TABLET | Freq: Every day | ORAL | Status: DC
  Administered 2022-07-06 – 2022-07-14 (×8): 81 mg via ORAL
  Filled 2022-07-05 (×9): qty 1

## 2022-07-05 MED ORDER — ADULT MULTIVITAMIN W/MINERALS CH
1.0000 | ORAL_TABLET | Freq: Every day | ORAL | Status: DC
  Administered 2022-07-06 – 2022-07-13 (×8): 1 via ORAL
  Filled 2022-07-05 (×8): qty 1

## 2022-07-05 MED ORDER — VANCOMYCIN HCL IN DEXTROSE 1-5 GM/200ML-% IV SOLN
1000.0000 mg | Freq: Once | INTRAVENOUS | Status: AC
  Administered 2022-07-05: 1000 mg via INTRAVENOUS
  Filled 2022-07-05: qty 200

## 2022-07-05 MED ORDER — METRONIDAZOLE 500 MG/100ML IV SOLN
500.0000 mg | Freq: Two times a day (BID) | INTRAVENOUS | Status: AC
  Administered 2022-07-06 – 2022-07-12 (×12): 500 mg via INTRAVENOUS
  Filled 2022-07-05 (×13): qty 100

## 2022-07-05 MED ORDER — CLOPIDOGREL BISULFATE 75 MG PO TABS
75.0000 mg | ORAL_TABLET | Freq: Every day | ORAL | Status: DC
  Administered 2022-07-06: 75 mg via ORAL
  Filled 2022-07-05: qty 1

## 2022-07-05 MED ORDER — ACETAMINOPHEN 650 MG RE SUPP: 650.0000 mg | Freq: Four times a day (QID) | RECTAL | Status: DC | PRN

## 2022-07-05 MED ORDER — HYDROCODONE-ACETAMINOPHEN 5-325 MG PO TABS
1.0000 | ORAL_TABLET | ORAL | Status: DC | PRN
  Administered 2022-07-06 – 2022-07-09 (×9): 2 via ORAL
  Administered 2022-07-09: 1 via ORAL
  Administered 2022-07-10 – 2022-07-14 (×18): 2 via ORAL
  Filled 2022-07-05 (×28): qty 2

## 2022-07-05 MED ORDER — SODIUM CHLORIDE 0.9 % IV SOLN
2.0000 g | Freq: Three times a day (TID) | INTRAVENOUS | Status: DC
  Administered 2022-07-05 – 2022-07-09 (×11): 2 g via INTRAVENOUS
  Filled 2022-07-05 (×12): qty 12.5

## 2022-07-05 MED ORDER — TIOTROPIUM BROMIDE MONOHYDRATE 18 MCG IN CAPS
1.0000 | ORAL_CAPSULE | Freq: Every day | RESPIRATORY_TRACT | Status: DC
  Administered 2022-07-07 – 2022-07-14 (×8): 18 ug via RESPIRATORY_TRACT
  Filled 2022-07-05 (×2): qty 5

## 2022-07-05 MED ORDER — ONDANSETRON HCL 4 MG/2ML IJ SOLN: 4.0000 mg | Freq: Four times a day (QID) | INTRAMUSCULAR | Status: DC | PRN

## 2022-07-05 NOTE — ED Triage Notes (Addendum)
Pt presents to the ED via [facility] from wound care clinic. Pt states that this morning when she woke up she was feeling weak and fatigued. Pt reports night sweats as well. Wound care clinic got a BP of 50/40. BP with EMS 86/52. Fluids hanging at this time. Recent right big toe amputation. Pt denies pain or SHOB. Pt received NaCl with EMS.

## 2022-07-05 NOTE — Assessment & Plan Note (Signed)
2D echo February 2024 with EF of 50 to 55% Appears euvolemic Monitor volume status closely

## 2022-07-05 NOTE — ED Provider Notes (Signed)
[facility] Provider Note    Event Date/Time   First MD Initiated Contact with Patient 07/05/22 1336     (approximate)   History   Hypotension   HPI  Beth Carroll is a 52 y.o. female  here with hypotension. Pt sent in from clinic. She was at podiatry today when she began to feel lightheaded, weak. She had felt "off" earlier today but wanted to go to clinic for eval of her left foot wound, for which she is on ABX and being tx after recent amputation. She had a BP 50-60s systolic at podiatry and was near syncopal. EMS called and pt brought here. EMS BP 70-80s and pt given IVF, improved here. She now feels better. H/o similar episodes in past of unknown etiology. Denies known fevers. Her foot wound has been "doing okay."       Physical Exam   Triage Vital Signs: ED Triage Vitals  Enc Vitals Group     BP 07/05/22 1322 (!) 151/84     Pulse Rate 07/05/22 1322 82     Resp 07/05/22 1322 16     Temp 07/05/22 1322 97.7 F (36.5 C)     Temp Source 07/05/22 1322 Oral     SpO2 07/05/22 1314 98 %     Weight 07/05/22 1322 118 lb (53.5 kg)     Height 07/05/22 1322 5\' 6"  (1.676 m)     Head Circumference --      Peak Flow --      Pain Score 07/05/22 1322 0     Pain Loc --      Pain Edu? --      Excl. in GC? --     Most recent vital signs: Vitals:   07/05/22 1800 07/05/22 1850  BP: (!) 167/79 (!) 191/87  Pulse: 84 95  Resp: 16 15  Temp: 98 F (36.7 C) 98.7 F (37.1 C)  SpO2: 97% 98%     General: Awake, no distress.  CV:  Good peripheral perfusion. RRR. Resp:  Normal work of breathing. Lungs clear. Abd:  No distention. No tenderness. Other:  Right lower extremity with open wound to lateral foot at site of recent amputation, small amount of erythema/warmth noted along wound edges.   ED Results / Procedures / Treatments   Labs (all labs ordered are listed, but only abnormal results are displayed) Labs Reviewed  CBC WITH DIFFERENTIAL/PLATELET -  Abnormal; Notable for the following components:      Result Value   Hemoglobin 9.3 (*)    HCT 32.7 (*)    MCH 22.9 (*)    MCHC 28.4 (*)    All other components within normal limits  COMPREHENSIVE METABOLIC PANEL - Abnormal; Notable for the following components:   Sodium 134 (*)    Glucose, Bld 308 (*)    BUN 21 (*)    All other components within normal limits  SEDIMENTATION RATE - Abnormal; Notable for the following components:   Sed Rate 48 (*)    All other components within normal limits  CULTURE, BLOOD (ROUTINE X 2)  CULTURE, BLOOD (ROUTINE X 2)  AEROBIC/ANAEROBIC CULTURE W GRAM STAIN (SURGICAL/DEEP WOUND)  LACTIC ACID, PLASMA  LACTIC ACID, PLASMA  PROCALCITONIN  URINALYSIS, ROUTINE W REFLEX MICROSCOPIC  C-REACTIVE PROTEIN  CBC  COMPREHENSIVE METABOLIC PANEL  PREALBUMIN  TROPONIN I (HIGH SENSITIVITY)     EKG    RADIOLOGY DG Foot Right: Pending   I also independently reviewed and agree with radiologist  interpretations.   PROCEDURES:  Critical Care performed: No  Procedures    MEDICATIONS ORDERED IN ED: Medications  0.9 %  sodium chloride infusion (has no administration in time range)  ondansetron (ZOFRAN) tablet 4 mg (has no administration in time range)    Or  ondansetron (ZOFRAN) injection 4 mg (has no administration in time range)  apixaban (ELIQUIS) tablet 5 mg (has no administration in time range)  aspirin EC tablet 81 mg (has no administration in time range)  atorvastatin (LIPITOR) tablet 80 mg (has no administration in time range)  clopidogrel (PLAVIX) tablet 75 mg (has no administration in time range)  insulin glargine-yfgn (SEMGLEE) injection 20 Units (has no administration in time range)  multivitamin with minerals tablet 1 tablet (has no administration in time range)  pantoprazole (PROTONIX) EC tablet 40 mg (has no administration in time range)  tiotropium (SPIRIVA) inhalation capsule ([facility] use ONLY) 18 mcg (has no administration in time  range)  insulin aspart (novoLOG) injection 0-15 Units (has no administration in time range)  insulin aspart (novoLOG) injection 0-5 Units (has no administration in time range)  insulin aspart (novoLOG) injection 4 Units (has no administration in time range)  metroNIDAZOLE (FLAGYL) IVPB 500 mg (has no administration in time range)  ceFEPIme (MAXIPIME) 2 g in sodium chloride 0.9 % 100 mL IVPB (has no administration in time range)  vancomycin (VANCOREADY) IVPB 1250 mg/250 mL (has no administration in time range)  gadobutrol (GADAVIST) 1 MMOL/ML injection 5 mL (has no administration in time range)  sodium chloride 0.9 % bolus 1,000 mL (0 mLs Intravenous Stopped 07/05/22 1555)  vancomycin (VANCOCIN) IVPB 1000 mg/200 mL premix (1,000 mg Intravenous New Bag/Given 07/05/22 1722)  piperacillin-tazobactam (ZOSYN) IVPB 3.375 g (0 g Intravenous Stopped 07/05/22 1721)     IMPRESSION / MDM / ASSESSMENT AND PLAN / ED COURSE  I reviewed the triage vital signs and the nursing notes.                              Differential diagnosis includes, but is not limited to, sepsis from foot cellulitis, osteo, dehydration, anemia, arrhythmia  Patient's presentation is most consistent with acute presentation with potential threat to life or bodily function.  The patient is on the cardiac monitor to evaluate for evidence of arrhythmia and/or significant heart rate changes  52 yo F with h/o HTN, COPD, CHF, PAD, recent amputation R foot here with transient hypotension. On ABX for superficial wound infection. Initial concern for sepsis though pt has had similar episodes in the past per report and records.  CBC without leukocytosis. BMP shows elevated BUN/Cr ratio c/w dehydration. IVF given. LA normal. XR pending. Concern for possible osteo clinically and given recurrent episodes of sepsis physiology.   FINAL CLINICAL IMPRESSION(S) / ED DIAGNOSES   Final diagnoses:  Wound infection  Wound of right foot     Rx / DC  Orders   ED Discharge Orders     None        Note:  This document was prepared using Dragon voice recognition software and may include unintentional dictation errors.   Shaune Pollack, MD 07/05/22 [phone]

## 2022-07-05 NOTE — Progress Notes (Addendum)
[place], Beth Carroll ([id number]) [id number] Page 1 of 7 Visit Report for 07/05/2022 Chief Complaint Document Details Patient Name: Date of Service: Beth Carroll, Beth Carroll [facility] 07/05/2022 12:30 PM Medical Record Number: [id number] Patient Account Number: [id number] Date of Birth/Sex: Treating RN: 04-Sep-1970 (52 y.o. Freddy Finner Primary Care Provider: Marcelino Duster Other Clinician: Referring Provider: Treating Provider/Extender: Joylene Grapes in Treatment: 8 Information Obtained from: Patient Chief Complaint Bilateral LE Ulcers Electronic Signature(s) Signed: 07/05/2022 12:33:09 PM By: Allen Derry PA-C Entered By: Allen Derry on 07/05/2022 12:33:08 -------------------------------------------------------------------------------- HPI Details Patient Name: Date of Service: Beth Carroll, Beth Carroll [facility] 07/05/2022 12:30 PM Medical Record Number: [id number] Patient Account Number: [id number] Date of Birth/Sex: Treating RN: 10/13/1970 (52 y.o. Freddy Finner Primary Care Provider: Marcelino Duster Other Clinician: Referring Provider: Treating Provider/Extender: Joylene Grapes in Treatment: 8 History of Present Illness HPI Description: 05-07-2022 upon evaluation today patient appears to be doing somewhat poorly in regard to her right foot in general. She subsequently also has a wound on the sacral area but this is a very superficial stage II pressure ulcer. Nonetheless the foot in particular although I could not get in epic I gleaned is much as I could from her story of what exactly happened here. T backtrack it does appear that she was in the hospital for diabetic ketoacidosis and this o was back in March 2024. With that being said during the time that she was in the hospital she also apparently had an issue with having a thrombus in the right leg in the ankle/foot region which ended up with her having essentially complete loss of blood flow to her foot where  it was turning purple and discoloring rapidly. She ended up having a nurse evaluate this and ended up with a stat angiography to go in and find what was going on they were able to identify the clot clear this out and subsequently restore blood flow. However this was not without already having damage to the distal portion of her toes. With that being said the good news is she seems to have good blood flow at the moment I was able to recheck and that we were able to get a good pulse with Doppler. With that being said I think that a lot of the injury to her toes is more of a degloving type injury at this point which hopefully will heal the first and second toes are the main ones that are of concern in my opinion the others I think just have blisters which will come off easily and should heal quite readily. The patient tells me that her blood sugars are still elevated but not as bad as what they have been previous. Again she has had multiple instances of having fairly significant DKA based on what I am hearing. Patient does have a history of peripheral vascular disease of the right lower extremity, stage II pressure ulcer to the sacrum that occurred while she was in the hospital, diabetes mellitus type 2, congestive heart failure, and hypertension. Her most recent hemoglobin A1c was 9.8 that was on 04-23-2022 05-13-2022 upon evaluation today patient appears to be doing decently well with regard to her wounds in general. With that being said it is the great toe and the [place], Beth Carroll ([id number]) [id number] Page 2 of 7 second toe and the most concerned I am not certain that the tip of the great toe survive. Nonetheless I am good I have a look at what we can see today. 05-21-2022  upon evaluation today patient appears to be doing well currently in regard to her wounds all things considered. Fortunately I do not see any signs of active infection locally nor systemically which is great  news but at the same time I do believe that she is making progress but just doing so fairly slowly. She still seems to have good arterial flow at this point. 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 I think she could be a hyperbaric candidate considering that she does have diabetes and again were not seeing a lot of significant improvement here. I think this is something that we should at least contemplate and look into it may be something that she would be interested in but I do not know for sure having to discuss it with her next week whenever she comes in for evaluation. 05-31-2022 upon evaluation today patient appears to be doing well currently in regard to her heel and the remainder of her foot minus the first and second toe of her right foot. She did see Dr. Wyn Quaker there could be looking at amputation of the great toe as well as the distal portion of the second toe again and these did not survive following the thrombotic event. The good news is she seems to be doing much better and has excellent flow now to be able to heal this surgery. 06-14-23 upon evaluation today patient appears to be doing well currently in regard to her wound on the heel which is actually smaller although it was bleeding a lot she was wrapped with a bulky dressing from the surgery which was actually 2 Thursdays ago so about a week and a half she has had this dressing in place. Subsequently she had bleeding and it was very stuck that was a big part of the issue here. I do not see any signs of infection which is good news she does to me appear that she would benefit from using some Betadine and dry gauze over the surgical site in order to prevent this from continuing to bleed help dry it up and keep it from becoming infected. She voiced understanding. Otherwise she notes that she does have a follow-up that she thinks is 2 weeks away with vascular. She is back on her blood thinners at this point.  Unfortunately she does have a new heel ulcer on the left side which is of concern this happened while she was in the hospital. 06-22-2022 upon evaluation today patient's heels are actually showing some signs of good improvement which is great news. Fortunately I do not see any signs of infection in either heel location. With that being said a little bit more concerned about the amputation site. I am not sure this is doing quite as well as what we would like to see. I discussed with the patient today that we probably need to see about having her have a follow-up with vascular little bit sooner. She voiced understanding. I am going to give her a note today as well. Not sure if she really just needs another antibiotic if they want to do a culture or something just more empiric. I am also uncertain if they want to change anything from a dressing perspective. Right now it has been Betadine with dry dressing to cover that is been initiated I did send orders to home health for this since they were changing the heel and it was somewhat intertwined with the dressing on the end of the foot. But  again being that this is a postop wound where he can have coordinating with vascular on this. 06-28-2022 upon evaluation today patient appears to be doing well currently in regard to her heel ulcers in fact the left is healed the right looks better and a little hyper granulated, perform some silver nitrate at the site. With that being said the amputation site is still somewhat wet and again I am unsure whether this is going to seal up or if it is going to dehisce. She is seeing the vascular surgeons for this were really not doing anything for treatment at this point there is using Betadine and dry dressing. 07-05-2022 upon evaluation today patient comes in for reevaluation. Unfortunately she is not doing well her blood pressure is extremely low registering it initially at 59/41 subsequently 57/43 heart rate is right around  72-77 taken manually. Oxygen saturation 96% she has had recent cold sweats and dizziness she also appears to be having issues here with either severe dehydration which I think is less likely although the differential does include this I think it is very possible she may actually be septic. I think she needs to go to the ER for further evaluation. She has recently had surgery with Dr. Wyn Quaker. Electronic Signature(s) Signed: 07/05/2022 12:34:41 PM By: Allen Derry PA-C Entered By: Allen Derry on 07/05/2022 12:34:40 -------------------------------------------------------------------------------- Physical Exam Details Patient Name: Date of Service: Beth Carroll, Beth Carroll 07/05/2022 12:30 PM Medical Record Number: [id number] Patient Account Number: [id number] Date of Birth/Sex: Treating RN: 1970-02-28 (52 y.o. Freddy Finner Primary Care Provider: Marcelino Duster Other Clinician: Referring Provider: Treating Provider/Extender: Joylene Grapes in Treatment: 8 Constitutional Well-nourished and well-hydrated in no acute distress. Respiratory normal breathing without difficulty. Psychiatric this patient is able to make decisions and demonstrates good insight into disease process. Alert and Oriented x 3. pleasant and cooperative. Notes 1. Based on what I am seeing I do believe that the patient needs to go to the ER for further evaluation and treatment. I am concerned about her being septic based on the blood pressure reading at this point. This is extremely low and I discussed this with the patient and her daughter. I think she needs to be checked out thoroughly at this point. Electronic Signature(s) [place], Beth Carroll ([id number]) [id number] Page 3 of 7 Signed: 07/05/2022 12:35:01 PM By: Allen Derry PA-C Entered By: Allen Derry on 07/05/2022 12:35:01 -------------------------------------------------------------------------------- Physician Orders Details Patient  Name: Date of Service: MILI, PILTZ [facility] 07/05/2022 12:30 PM Medical Record Number: [id number] Patient Account Number: [id number] Date of Birth/Sex: Treating RN: Jun 05, 1970 (52 y.o. Cathlean Cower, Kim Primary Care Provider: Marcelino Duster Other Clinician: Referring Provider: Treating Provider/Extender: Joylene Grapes in Treatment: 8 Verbal / Phone Orders: No Diagnosis Coding ICD-10 Coding Code Description I73.89 Other specified peripheral vascular diseases L97.512 Non-pressure chronic ulcer of other part of right foot with fat layer exposed L97.522 Non-pressure chronic ulcer of other part of left foot with fat layer exposed L89.152 Pressure ulcer of sacral region, stage 2 L89.616 Pressure-induced deep tissue damage of right heel L89.626 Pressure-induced deep tissue damage of left heel E11.621 Type 2 diabetes mellitus with foot ulcer I50.42 Chronic combined systolic (congestive) and diastolic (congestive) heart failure I10 Essential (primary) hypertension Additional Orders / Instructions Go to Emergency Department of your choice for evaluation and treatment. - EMS transfer to [facility] ED for evaluation and treatment Wound Treatment Electronic Signature(s) Signed: 07/14/2022 8:30:26 AM By: Elliot Gurney, BSN, RN, CWS, Kim RN, BSN Signed: 07/19/2022  5:57:02 PM By: Allen Derry PA-C Entered By: Elliot Gurney BSN, RN, CWS, Kim on 07/14/2022 08:30:26 -------------------------------------------------------------------------------- Problem List Details Patient Name: Date of Service: Beth Carroll, Beth Carroll [facility] 07/05/2022 12:30 PM Medical Record Number: [id number] Patient Account Number: [id number] Date of Birth/Sex: Treating RN: April 03, 1970 (52 y.o. Freddy Finner Primary Care Provider: Marcelino Duster Other Clinician: Referring Provider: Treating Provider/Extender: Joylene Grapes in Treatment: 8 [place], [place] ([id number]) [id number] Page 4 of 7 Active  Problems ICD-10 Encounter Code Description Active Date MDM Diagnosis I73.89 Other specified peripheral vascular diseases 05/06/2022 No Yes L97.512 Non-pressure chronic ulcer of other part of right foot with fat layer exposed 05/06/2022 No Yes L97.522 Non-pressure chronic ulcer of other part of left foot with fat layer exposed 05/06/2022 No Yes L89.152 Pressure ulcer of sacral region, stage 2 05/06/2022 No Yes L89.616 Pressure-induced deep tissue damage of right heel 05/06/2022 No Yes L89.626 Pressure-induced deep tissue damage of left heel 05/06/2022 No Yes E11.621 Type 2 diabetes mellitus with foot ulcer 05/06/2022 No Yes I50.42 Chronic combined systolic (congestive) and diastolic (congestive) heart failure 05/06/2022 No Yes I10 Essential (primary) hypertension 05/06/2022 No Yes Inactive Problems Resolved Problems Electronic Signature(s) Signed: 07/05/2022 12:33:03 PM By: Allen Derry PA-C Entered By: Allen Derry on 07/05/2022 12:33:03 -------------------------------------------------------------------------------- Progress Note Details Patient Name: Date of Service: Beth Carroll, Beth Carroll 07/05/2022 12:30 PM Medical Record Number: [id number] Patient Account Number: [id number] Date of Birth/Sex: Treating RN: 1970-02-01 (52 y.o. Freddy Finner Primary Care Provider: Marcelino Duster Other Clinician: Referring Provider: Treating Provider/Extender: Joylene Grapes in Treatment: 8 [place], [place] ([id number]) [id number] Page 5 of 7 Subjective Chief Complaint Information obtained from Patient Bilateral LE Ulcers History of Present Illness (HPI) 05-07-2022 upon evaluation today patient appears to be doing somewhat poorly in regard to her right foot in general. She subsequently also has a wound on the sacral area but this is a very superficial stage II pressure ulcer. Nonetheless the foot in particular although I could not get in epic I gleaned is much as I  could from her story of what exactly happened here. T backtrack it does appear that she was in the hospital for diabetic ketoacidosis and this was back in March o 2024. 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 She ended up having a nurse evaluate this and ended up with a stat angiography to go in and find what was going on they were able to identify the clot clear this out and subsequently restore blood flow. However this was not without already having damage to the distal portion of her toes. With that being said the good news is she seems to have good blood flow at the moment I was able to recheck and that we were able to get a good pulse with Doppler. With that being said I think that a lot of the injury to her toes is more of a degloving type injury at this point which hopefully will heal the first and second toes are the main ones that are of concern in my opinion the others I think just have blisters which will come off easily and should heal quite readily. The patient tells me that her blood sugars are still elevated but not as  bad as what they have been previous. Again she has had multiple instances of having fairly significant DKA based on what I am hearing. Patient does have a history of peripheral vascular disease of the right lower extremity, stage II pressure ulcer to the sacrum that occurred while she was in the hospital, diabetes mellitus type 2, congestive heart failure, and hypertension. Her most recent hemoglobin A1c was 9.8 that was on 04-23-2022 05-13-2022 upon evaluation today patient appears to be doing decently well with regard to her wounds in general. With that being said it is the great toe and the second toe and the most  concerned I am not certain that the tip of the great toe survive. Nonetheless I am good I have a look at what we can see today. 05-21-2022 upon evaluation today patient appears to be doing well currently in regard to her wounds all things considered. Fortunately I do not see any signs of active infection locally nor systemically which is great news but at the same time I do believe that she is making progress but just doing so fairly slowly. She still seems to have good arterial flow at this point. 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 I think she could be a hyperbaric candidate considering that she does have diabetes and again were not seeing a lot of significant improvement here. I think this is something that we should at least contemplate and look into it may be something that she would be interested in but I do not know for sure having to discuss it with her next week whenever she comes in for evaluation. 05-31-2022 upon evaluation today patient appears to be doing well currently in regard to her heel and the remainder of her foot minus the first and second toe of her right foot. She did see Dr. Wyn Quaker there could be looking at amputation of the great toe as well as the distal portion of the second toe again and these did not survive following the thrombotic event. The good news is she seems to be doing much better and has excellent flow now to be able to heal this surgery. 06-14-23 upon evaluation today patient appears to be doing well currently in regard to her wound on the heel which is actually smaller although it was bleeding a lot she was wrapped with a bulky dressing from the surgery which was actually 2 Thursdays ago so about a week and a half she has had this dressing in place. Subsequently she had bleeding and it was very stuck that was a big part of the issue here. I do not see any signs of infection which is good news she does to me appear that she would benefit  from using some Betadine and dry gauze over the surgical site in order to prevent this from continuing to bleed help dry it up and keep it from becoming infected. She voiced understanding. Otherwise she notes that she does have a follow-up that she thinks is 2 weeks away with vascular. She is back on her blood thinners at this point. Unfortunately she does have a new heel ulcer on the left side which is of concern this happened while she was in the hospital. 06-22-2022 upon evaluation today patient's heels are actually showing some signs of good improvement which is great news. Fortunately I do not see any signs of infection in either heel location. With that being said a little bit more concerned about the amputation  site. I am not sure this is doing quite as well as what we would like to see. I discussed with the patient today that we probably need to see about having her have a follow-up with vascular little bit sooner. She voiced understanding. I am going to give her a note today as well. Not sure if she really just needs another antibiotic if they want to do a culture or something just more empiric. I am also uncertain if they want to change anything from a dressing perspective. Right now it has been Betadine with dry dressing to cover that is been initiated I did send orders to home health for this since they were changing the heel and it was somewhat intertwined with the dressing on the end of the foot. But again being that this is a postop wound where he can have coordinating with vascular on this. 06-28-2022 upon evaluation today patient appears to be doing well currently in regard to her heel ulcers in fact the left is healed the right looks better and a little hyper granulated, perform some silver nitrate at the site. With that being said the amputation site is still somewhat wet and again I am unsure whether this is going to seal up or if it is going to dehisce. She is seeing the vascular  surgeons for this were really not doing anything for treatment at this point there is using Betadine and dry dressing. 07-05-2022 upon evaluation today patient comes in for reevaluation. Unfortunately she is not doing well her blood pressure is extremely low registering it initially at 59/41 subsequently 57/43 heart rate is right around 72-77 taken manually. Oxygen saturation 96% she has had recent cold sweats and dizziness she also appears to be having issues here with either severe dehydration which I think is less likely although the differential does include this I think it is very possible she may actually be septic. I think she needs to go to the ER for further evaluation. She has recently had surgery with Dr. Wyn Quaker. Objective Constitutional Well-nourished and well-hydrated in no acute distress. Vitals Time T aken: 12:22 PM, Height: 66 in, Weight: 118 lbs, BMI: 19, Pulse: 77 bpm, Respiratory Rate: 18 breaths/min, Blood Pressure: 57/43 mmHg, Pulse Oximetry: 96 %. General Notes: per pt lightheaded and dizzy, pt states feels this way daily. Patient states BG this AM was high 260 range. PA Stone aware and in room. Patient sitting in wheelchair currently. 1224 re-check 59/41. HR regular. EMS to be called to take patient to ED due to status. Respiratory normal breathing without difficulty. Psychiatric this patient is able to make decisions and demonstrates good insight into disease process. Alert and Oriented x 3. pleasant and cooperative. [place], Beth Carroll ([id number]) [id number] Page 6 of 7 General Notes: 1. Based on what I am seeing I do believe that the patient needs to go to the ER for further evaluation and treatment. I am concerned about her being septic based on the blood pressure reading at this point. This is extremely low and I discussed this with the patient and her daughter. I think she needs to be checked out thoroughly at this point. Assessment Active  Problems ICD-10 Other specified peripheral vascular diseases Non-pressure chronic ulcer of other part of right foot with fat layer exposed Non-pressure chronic ulcer of other part of left foot with fat layer exposed Pressure ulcer of sacral region, stage 2 Pressure-induced deep tissue damage of right heel Pressure-induced deep tissue damage of left heel Type 2  diabetes mellitus with foot ulcer Chronic combined systolic (congestive) and diastolic (congestive) heart failure Essential (primary) hypertension Plan 1. I am good recommend that we have the patient go to the ER for further evaluation. I did call EMS and they are coming to pick her up to take her directly to the hospital. 2. I am also can recommend that we have the patient continue to be closely monitored until EMS does pick her up. 3. I am also going to continue to utilize the same wound care orders as far as I am concerned until we see her back following the hospital stay we will see how things go but again my biggest concern here is that the patient may be septic. We will see patient back for reevaluation in 1 week here in the clinic. If anything worsens or changes patient will contact our office for additional recommendations. Electronic Signature(s) Signed: 07/05/2022 12:35:37 PM By: Allen Derry PA-C Entered By: Allen Derry on 07/05/2022 12:35:37 -------------------------------------------------------------------------------- SuperBill Details Patient Name: Date of Service: Beth Carroll, Beth Carroll [facility] 07/05/2022 Medical Record Number: [id number] Patient Account Number: [id number] Date of Birth/Sex: Treating RN: [birth year] (52 y.o. Freddy Finner Primary Care Provider: Marcelino Duster Other Clinician: Referring Provider: Treating Provider/Extender: Joylene Grapes in Treatment: 8 Diagnosis Coding ICD-10 Codes Code Description I73.89 Other specified peripheral vascular diseases L97.512 Non-pressure chronic ulcer of  other part of right foot with fat layer exposed L97.522 Non-pressure chronic ulcer of other part of left foot with fat layer exposed L89.152 Pressure ulcer of sacral region, stage 2 L89.616 Pressure-induced deep tissue damage of right heel L89.626 Pressure-induced deep tissue damage of left heel [place], Beth Carroll ([id number]) [id number] Page 7 of 7 E11.621 Type 2 diabetes mellitus with foot ulcer I50.42 Chronic combined systolic (congestive) and diastolic (congestive) heart failure I10 Essential (primary) hypertension Facility Procedures : CPT4 Code: 16010932 Description: [phone] - WOUND CARE VISIT-LEV 2 EST PT Modifier: Quantity: 1 Physician Procedures : CPT4 Code Description Modifier [id number] - WC PHYS LEVEL 5 - EST PT ICD-10 Diagnosis Description I73.89 Other specified peripheral vascular diseases L97.512 Non-pressure chronic ulcer of other part of right foot with fat layer exposed L97.522  Non-pressure chronic ulcer of other part of left foot with fat layer exposed L89.152 Pressure ulcer of sacral region, stage 2 Quantity: 1 Electronic Signature(s) Signed: 07/14/2022 8:29:32 AM By: Elliot Gurney, BSN, RN, CWS, Kim RN, BSN Signed: 07/19/2022 5:57:02 PM By: Allen Derry PA-C Previous Signature: 07/05/2022 12:36:09 PM Version By: Allen Derry PA-C Entered By: Elliot Gurney, BSN, RN, CWS, Kim on 07/14/2022 08:29:31

## 2022-07-05 NOTE — Progress Notes (Signed)
Patient admitted to Room 135 from ED. A+Ox4. VS taken. Hypertensive otherwise vital signs stable. Admission completed. Assessment to be completed with Night Shift who will continue to assess per plan of care.

## 2022-07-05 NOTE — Assessment & Plan Note (Signed)
Blood sugars in 300s on presentation Major confounder to wound complications which I discussed with the patient Continue home long-acting insulin Sliding-scale

## 2022-07-05 NOTE — H&P (Addendum)
History and Physical    Patient: Beth Carroll ZOX:[id number] DOB: 1970/11/14 DOA: 07/05/2022 DOS: the patient was seen and examined on 07/05/2022 PCP: Gracelyn Nurse, MD  Patient coming from: Home  Chief Complaint:  Chief Complaint  Patient presents with   Hypotension   HPI: Beth Carroll is a 52 y.o. female with medical history significant of multiple medical issues including peripheral artery disease, hyperglycemia with type 2 diabetes, GERD, HFpEF, coronary artery disease presenting with right diabetic foot infection.  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  Patient reports fairly stable postoperative follow-up.  Was placed on course of doxycycline for prophylactic infectious coverage.  Was seen by the podiatrist today with noted lower limit of normal blood pressures.  And was redirected to the ER for further evaluation.  Patient denies any significant fevers or chills.  Has had some mild redness.  Minimal pain.  Minimal to mild purulent drainage.  No longer smoking.  Blood sugars have been into the 200s at home.  No chest pain or shortness of breath.  No nausea or vomiting.  Has been compliant with home regimen including aspirin, Plavix, Eliquis. Presented to the ER afebrile, hemodynamically stable.  White count 5.6, hemoglobin 9.3, creatinine 0.5, glucose 308, lactate 1.8.  Right foot plain films with concern for possible osteomyelitis.  Chest x-ray grossly stable. Review of Systems: As mentioned in the history of present illness. All other systems reviewed and are negative. Past Medical History:  Diagnosis Date   Acute deep vein thrombosis (DVT) of right lower extremity (HCC)    ADHD (attention deficit hyperactivity disorder)    AKI (acute kidney injury) (HCC) 04/30/2022   Aortic atherosclerosis (HCC)    COPD (chronic obstructive pulmonary disease) (HCC)    Coronary artery disease  12/15/2020   a.) LHC/PCI 12/15/2020: 99% mLAD (2.25 x 18 mm Onyx Frontier DES), 90% RI (2.75 x 22 mm Onyx Frontier DES), 40% m-dLAD   Diabetes mellitus without complication (HCC)    Diabetes mellitus, type 2 (HCC)    GERD (gastroesophageal reflux disease)    HFrEF (heart failure with reduced ejection fraction) (HCC)    a.) TTE 12/10/2020: EF 40-45%, apical/periapical HK, G2DD; b.) LHC 12/15/2020: EF 35-45%; c.) TTE 06/26/2021: EF 40-45%, LVH, G2DD; d.) TTE 03/21/2022: EF 55-60%, LV/apical sep/ant wall HK, Triv MR, AoV sclerosis   High anion gap metabolic acidosis 04/30/2022   Hyperlipemia    Hypertension    Hypophosphatemia 04/30/2022   Ischemic cardiomyopathy    a.) TTE 12/10/2020: EF 40-45%; b.) LHC 12/15/2020: EF 35-45%; c.) TTE 06/26/2021: EF 40-45%; d.) TTE 03/21/2022: EF 55-60%   Meningioma (HCC)    a.) CT head and MRI brain 08/21/2021: LEFT parietal meningioma   Myocardial infarction (HCC)    Nausea vomiting and diarrhea 04/30/2022   Orthostatic hypotension    PAD (peripheral artery disease) (HCC)    Sepsis secondary to UTI (HCC) 04/30/2022   Tobacco use    Past Surgical History:  Procedure Laterality Date   AMPUTATION Right 06/03/2022   Procedure: AMPUTATION RAY ( 1ST AND 2ND TOE);  Surgeon: Annice Needy, MD;  Location: [facility];  Service: Vascular;  Laterality: Right;   CHOLECYSTECTOMY     CORONARY STENT INTERVENTION N/A 12/15/2020   Procedure: CORONARY STENT INTERVENTION;  Surgeon: Iran Ouch, MD;  Location: [facility];  Service: Cardiovascular;  Laterality: N/A;   LEFT HEART CATH  AND CORONARY ANGIOGRAPHY N/A 12/15/2020   Procedure: LEFT HEART CATH AND CORONARY ANGIOGRAPHY;  Surgeon: Iran Ouch, MD;  Location: [facility];  Service: Cardiovascular;  Laterality: N/A;   LOWER EXTREMITY ANGIOGRAPHY Left 12/10/2020   Procedure: Lower Extremity Angiography;  Surgeon: Annice Needy, MD;  Location: [facility];  Service: Cardiovascular;   Laterality: Left;   LOWER EXTREMITY ANGIOGRAPHY Right 12/25/2020   Procedure: LOWER EXTREMITY ANGIOGRAPHY;  Surgeon: Annice Needy, MD;  Location: [facility];  Service: Cardiovascular;  Laterality: Right;   LOWER EXTREMITY ANGIOGRAPHY Right 08/19/2021   Procedure: Lower Extremity Angiography;  Surgeon: Annice Needy, MD;  Location: [facility];  Service: Cardiovascular;  Laterality: Right;   LOWER EXTREMITY ANGIOGRAPHY Right 08/19/2021   Procedure: Lower Extremity Angiography;  Surgeon: Annice Needy, MD;  Location: [facility];  Service: Cardiovascular;  Laterality: Right;   LOWER EXTREMITY ANGIOGRAPHY Right 04/24/2022   Procedure: Lower Extremity Angiography;  Surgeon: Learta Codding, MD;  Location: [facility];  Service: Cardiovascular;  Laterality: Right;   ORIF FEMUR FRACTURE Right 12/14/2021   Procedure: OPEN REDUCTION INTERNAL FIXATION RIGHT DISTAL FEMUR;  Surgeon: Roby Lofts, MD;  Location: [facility];  Service: Orthopedics;  Laterality: Right;   Social History:  reports that she quit smoking about 18 months ago. Her smoking use included cigarettes. She has never used smokeless tobacco. She reports that she does not drink alcohol. No history on file for drug use.  No Known Allergies  Family History  Problem Relation Age of Onset   Hypertension Father     Prior to Admission medications   Medication Sig Start Date End Date Taking? Authorizing Provider  acetaminophen (TYLENOL) 650 MG CR tablet Take 650 mg by mouth every 8 (eight) hours as needed for pain.   Yes [provider]  albuterol (VENTOLIN HFA) 108 (90 Base) MCG/ACT inhaler Inhale 2 puffs into the lungs every 6 (six) hours as needed for wheezing or shortness of breath.   Yes [provider]  apixaban (ELIQUIS) 5 MG TABS tablet Take 1 tablet (5 mg total) by mouth 2 (two) times daily. 05/19/22  Yes Georgiana Spinner, NP  aspirin EC 81 MG tablet Take 81 mg by mouth daily. Swallow  whole.   Yes [provider]  atorvastatin (LIPITOR) 80 MG tablet Take 1 tablet (80 mg total) by mouth daily. 04/27/22 07/26/22 Yes Darlin Priestly, MD  barrier cream (NON-SPECIFIED) CREA Apply 1 Application topically 2 (two) times daily as needed. 03/22/22  Yes Sreenath, Sudheer B, MD  calcium carbonate (TUMS - DOSED IN MG ELEMENTAL CALCIUM) 500 MG chewable tablet Chew 1 tablet (200 mg of elemental calcium total) by mouth 3 (three) times daily before meals. 04/28/22  Yes Esaw Grandchild A, DO  clopidogrel (PLAVIX) 75 MG tablet Take 1 tablet (75 mg total) by mouth daily. 04/28/22 07/27/22 Yes Darlin Priestly, MD  doxycycline (VIBRAMYCIN) 100 MG capsule Take 1 capsule (100 mg total) by mouth 2 (two) times daily. 06/25/22  Yes Georgiana Spinner, NP  insulin aspart (NOVOLOG) 100 UNIT/ML injection Inject 8 Units into the skin 3 (three) times daily before meals.   Yes [provider]  insulin glargine (LANTUS SOLOSTAR) 100 UNIT/ML Solostar Pen Inject 30 Units into the skin at bedtime.   Yes [provider]  Multiple Vitamins-Minerals (CENTRUM SILVER 50+WOMEN PO) Take 1 tablet by mouth daily.   Yes [provider]  pantoprazole (PROTONIX) 40  MG tablet Take 1 tablet (40 mg total) by mouth daily. 12/17/20  Yes Elgergawy, Leana Roe, MD  tiotropium (SPIRIVA) 18 MCG inhalation capsule Place 1 capsule into inhaler and inhale daily. 09/24/21  Yes [provider]  feeding supplement (ENSURE ENLIVE / ENSURE PLUS) LIQD Take 237 mLs by mouth 3 (three) times daily between meals. 12/18/21   Calvert Cantor, MD  furosemide (LASIX) 20 MG tablet Take 20 mg by mouth daily. Patient not taking: Reported on 07/05/2022 05/08/22   [provider]  nutrition supplement, JUVEN, (JUVEN) PACK Take 1 packet by mouth 2 (two) times daily between meals. 06/04/22   Pace, Peggye Ley, NP  oxyCODONE-acetaminophen (PERCOCET/ROXICET) 5-325 MG tablet Take 1 tablet by mouth every 6 (six) hours as needed for moderate  pain. Patient not taking: Reported on 07/05/2022 06/25/22   Georgiana Spinner, NP  Vitamin D, Ergocalciferol, (DRISDOL) 1.25 MG (50000 UNIT) CAPS capsule Take 50,000 Units by mouth once a week. 10/28/20   [provider]    Physical Exam: Vitals:   07/05/22 1322 07/05/22 1437 07/05/22 1500 07/05/22 1555  BP: (!) 151/84 (!) 153/73 (!) 165/90 (!) 172/77  Pulse: 82 79 79 86  Resp: 16 14 12 14   Temp: 97.7 F (36.5 C)     TempSrc: Oral     SpO2: 99% 100% 99% 100%  Weight: 53.5 kg     Height: 5\' 6"  (1.676 m)      Physical Exam Constitutional:      Appearance: She is normal weight.  HENT:     Head: Normocephalic and atraumatic.     Nose: Nose normal.     Mouth/Throat:     Mouth: Mucous membranes are moist.  Eyes:     Pupils: Pupils are equal, round, and reactive to light.  Cardiovascular:     Rate and Rhythm: Normal rate and regular rhythm.  Pulmonary:     Effort: Pulmonary effort is normal.  Abdominal:     General: Abdomen is flat. Bowel sounds are normal.  Musculoskeletal:        General: Normal range of motion.     Cervical back: Normal range of motion.  Skin:    Comments: See right foot picture  Neurological:     General: No focal deficit present.  Psychiatric:        Mood and Affect: Mood normal.   \   Data Reviewed:  There are no new results to review at this time.  DG Chest 2 View CLINICAL DATA:  Weakness and hypotension.  Possible sepsis.  EXAM: CHEST - 2 VIEW  COMPARISON:  Chest radiograph 06/01/2022  FINDINGS: The cardiac silhouette is upper limits of normal in size. Aortic atherosclerosis is noted. The lungs are hyperinflated with mild interstitial coarsening. No airspace consolidation, edema, pleural effusion, or pneumothorax is identified. Right upper quadrant abdominal surgical clips are noted. No acute osseous abnormality is seen.  IMPRESSION: No active cardiopulmonary disease.  Electronically Signed   By: Sebastian Ache M.D.   On:  07/05/2022 16:21 DG Foot Complete Right CLINICAL DATA:  Wound care.  Weakness and fatigue.  EXAM: RIGHT FOOT COMPLETE - 3+ VIEW  COMPARISON:  None Available.  FINDINGS: The bones are osteopenic. There has been prior amputations of the first and second metatarsal heads and phalanges. There is some questionable minimal cortical erosions along the region of the second metatarsal neck. There is overlying soft tissue swelling. There is no acute fracture or dislocation identified. Plantar calcaneal spur is present.  There is diffuse soft tissue swelling of the foot.  IMPRESSION: 1. Prior amputations of the first and second metatarsal heads and phalanges. 2. Questionable minimal cortical erosions along the region of the second metatarsal neck with overlying soft tissue swelling. Osteomyelitis cannot be excluded. 3. Diffuse soft tissue swelling.  Electronically Signed   By: Darliss Cheney M.D.   On: 07/05/2022 15:54  Lab Results  Component Value Date   WBC 5.6 07/05/2022   HGB 9.3 (L) 07/05/2022   HCT 32.7 (L) 07/05/2022   MCV 80.5 07/05/2022   PLT 281 07/05/2022   Last metabolic panel Lab Results  Component Value Date   GLUCOSE 308 (H) 07/05/2022   NA 134 (L) 07/05/2022   K 3.8 07/05/2022   CL 99 07/05/2022   CO2 27 07/05/2022   BUN 21 (H) 07/05/2022   CREATININE 0.54 07/05/2022   GFRNONAA >60 07/05/2022   CALCIUM 9.2 07/05/2022   PHOS 3.0 04/25/2022   PROT 7.4 07/05/2022   ALBUMIN 3.6 07/05/2022   BILITOT 0.7 07/05/2022   ALKPHOS 104 07/05/2022   AST 21 07/05/2022   ALT 16 07/05/2022   ANIONGAP 8 07/05/2022    Assessment and Plan: * Diabetic foot infection (HCC) Noted right foot redness and swelling with recent 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 Wyn Quaker Plain films concern for possible osteomyelitis Noted to be on oral doxycycline empirically Case discussed with Dr. Wyn Quaker Will place on IV cefepime, Flagyl and vancomycin for  infectious coverage Blood cultures drawn in ER Wound culture x 1 MRI of the foot to better assess Follow-up on formal vascular surgery recommendations in the morning Follow   Hyperglycemia due to type 2 diabetes mellitus (HCC) Blood sugars in 300s on presentation Major confounder to wound complications which I discussed with the patient Continue home long-acting insulin Sliding-scale  COPD (chronic obstructive pulmonary disease) (HCC) Stable from respiratory standpoint Continue home inhalers  Dyslipidemia Continue statin  GERD without esophagitis PPI  CAD S/P percutaneous coronary angioplasty No active chest pain Continue home regimen  PAD (peripheral artery disease) (HCC) On Eliquis, aspirin, Plavix indefinitely per vascular surgery Continue  Essential hypertension BP stable Titrate home regimen  Chronic systolic CHF (congestive heart failure) (HCC) 2D echo February 2024 with EF of 50 to 55% Appears euvolemic Monitor volume status closely      Advance Care Planning:   Code Status: Full Code   Consults: Dr. Wyn Quaker w/ vascular surgery   Family Communication: No family at the bedside   Severity of Illness: The appropriate patient status for this patient is INPATIENT. Inpatient status is judged to be reasonable and necessary in order to provide the required intensity of service to ensure the patient's safety. The patient's presenting symptoms, physical exam findings, and initial radiographic and laboratory data in the context of their chronic comorbidities is felt to place them at high risk for further clinical deterioration. Furthermore, it is not anticipated that the patient will be medically stable for discharge from the hospital within 2 midnights of admission.   * I certify that at the point of admission it is my clinical judgment that the patient will require inpatient hospital care spanning beyond 2 midnights from the point of admission due to high intensity of  service, high risk for further deterioration and high frequency of surveillance required.*   Greater than 50% was spent in counseling and coordination of care with patient Total encounter time 80 minutes or more  Author: Viviann Spare  Merlinda Frederick, MD 07/05/2022 5:38 PM  For on call review [email or web address].

## 2022-07-05 NOTE — Assessment & Plan Note (Signed)
Continue statin. 

## 2022-07-05 NOTE — Assessment & Plan Note (Signed)
PPI ?

## 2022-07-05 NOTE — Progress Notes (Addendum)
[place], Beth Carroll ([id number]) [id number] Page 1 of 5 Visit Report for 07/05/2022 Clinic Level of Care Assessment Details Patient Name: Date of Service: Beth Carroll, Beth Carroll Beth Carroll [facility] 07/05/2022 12:30 PM Medical Record Number: [id number] Patient Account Number: [id number] Date of Birth/Sex: Treating RN: 23-Jan-1971 (52 y.o. Beth Carroll, Beth Carroll Primary Care Beth Carroll: Beth Carroll Other Clinician: Referring Beth Carroll: Treating Beth Carroll/Extender: Beth Carroll in Treatment: 8 Clinic Level of Care Assessment Items TOOL 4 Quantity Score []  - 0 Use when only an EandM is performed on FOLLOW-UP visit ASSESSMENTS - Nursing Assessment / Reassessment X- 1 10 Reassessment of Co-morbidities (includes updates in patient status) []  - 0 Reassessment of Adherence to Treatment Plan ASSESSMENTS - Wound and Skin A ssessment / Reassessment []  - 0 Simple Wound Assessment / Reassessment - one wound []  - 0 Complex Wound Assessment / Reassessment - multiple wounds []  - 0 Dermatologic / Skin Assessment (not related to wound area) ASSESSMENTS - Focused Assessment []  - 0 Circumferential Edema Measurements - multi extremities []  - 0 Nutritional Assessment / Counseling / Intervention []  - 0 Lower Extremity Assessment (monofilament, tuning fork, pulses) []  - 0 Peripheral Arterial Disease Assessment (using hand held doppler) ASSESSMENTS - Ostomy and/or Continence Assessment and Care []  - 0 Incontinence Assessment and Management []  - 0 Ostomy Care Assessment and Management (repouching, etc.) PROCESS - Coordination of Care []  - 0 Simple Patient / Family Education for ongoing care []  - 0 Complex (extensive) Patient / Family Education for ongoing care X- 1 10 Staff obtains Chiropractor, Records, T Results / Process Orders est []  - 0 Staff telephones HHA, Nursing Homes / Clarify orders / etc []  - 0 Routine Transfer to another Facility (non-emergent condition) []  - 0 Routine Hospital  Admission (non-emergent condition) []  - 0 New Admissions / Manufacturing engineer / Ordering NPWT Apligraf, etc. , X- 1 20 Emergency Hospital Admission (emergent condition) []  - 0 Simple Discharge Coordination []  - 0 Complex (extensive) Discharge Coordination PROCESS - Special Needs []  - 0 Pediatric / Minor Patient Management []  - 0 Isolation Patient Management Beth Carroll, Beth Carroll ([id number]) [id number] Page 2 of 5 []  - 0 Hearing / Language / Visual special needs []  - 0 Assessment of Community assistance (transportation, D/C planning, etc.) []  - 0 Additional assistance / Altered mentation []  - 0 Support Surface(s) Assessment (bed, cushion, seat, etc.) INTERVENTIONS - Wound Cleansing / Measurement []  - 0 Simple Wound Cleansing - one wound []  - 0 Complex Wound Cleansing - multiple wounds []  - 0 Wound Imaging (photographs - any number of wounds) []  - 0 Wound Tracing (instead of photographs) []  - 0 Simple Wound Measurement - one wound []  - 0 Complex Wound Measurement - multiple wounds INTERVENTIONS - Wound Dressings []  - 0 Small Wound Dressing one or multiple wounds []  - 0 Medium Wound Dressing one or multiple wounds []  - 0 Large Wound Dressing one or multiple wounds []  - 0 Application of Medications - topical []  - 0 Application of Medications - injection INTERVENTIONS - Miscellaneous []  - 0 External ear exam []  - 0 Specimen Collection (cultures, biopsies, blood, body fluids, etc.) []  - 0 Specimen(s) / Culture(s) sent or taken to Lab for analysis X- 1 10 Patient Transfer (multiple staff / Nurse, adult / Similar devices) []  - 0 Simple Staple / Suture removal (25 or less) []  - 0 Complex Staple / Suture removal (26 or more) []  - 0 Hypo / Hyperglycemic Management (close monitor of Blood Glucose) []  - 0 Ankle / Brachial Index (ABI) -  do not check if billed separately X- 1 5 Vital Signs Has the patient been seen at the hospital within the  last three years: Yes Total Score: 55 Level Of Care: New/Established - Level 2 Notes Patient was sent by EMS to hospital for evaluation and treatment. Electronic Signature(s) Signed: 07/19/2022 9:09:50 AM By: Beth Carroll, BSN, RN, CWS, Kim RN, BSN Entered By: Beth Carroll, BSN, RN, CWS, Beth Carroll on 07/14/2022 16:10:96 -------------------------------------------------------------------------------- Encounter Discharge Information Details Patient Name: Date of Service: Beth Carroll, Beth Carroll [facility] 07/05/2022 12:30 PM Medical Record Number: [id number] Patient Account Number: [id number] [name], [name] ([id number]) [id number] Page 3 of 5 Date of Birth/Sex: Treating RN: 09/14/1970 (52 y.o. Skip Mayer Primary Care Shalicia Craghead: Other Clinician: Marcelino Carroll Referring Terin Cragle: Treating Stori Royse/Extender: Beth Carroll in Treatment: 8 Encounter Discharge Information Items Discharge Condition: Stable Ambulatory Status: Stretcher Discharge Destination: Hospital Telephoned: No Orders Sent: Yes Transportation: Ambulance Schedule Follow-up Appointment: Yes Clinical Summary of Care: Electronic Signature(s) Signed: 07/14/2022 8:31:08 AM By: Beth Carroll, BSN, RN, CWS, Kim RN, BSN Entered By: Beth Carroll, BSN, RN, CWS, Beth Carroll on 07/14/2022 08:31:08 -------------------------------------------------------------------------------- Wound Assessment Details Patient Name: Date of Service: Beth Carroll, Beth Carroll [facility] 07/05/2022 12:30 PM Medical Record Number: [id number] Patient Account Number: [id number] Date of Birth/Sex: Treating RN: [birth year] (52 y.o. Beth Carroll, Beth Carroll Primary Care Liyat Faulkenberry: Beth Carroll Other Clinician: Referring Seth Friedlander: Treating Zeplin Aleshire/Extender: Beth Carroll in Treatment: 8 Wound Status Wound Number: 5 Primary Etiology: Arterial Insufficiency Ulcer Wound Location: Right Calcaneus Wound Status: Healed - Epithelialized Wounding Event: Gradually Appeared Date  Acquired: 04/21/2022 Weeks Of Treatment: 8 Clustered Wound: No Wound Measurements Length: (cm) Width: (cm) Depth: (cm) Area: (cm) Volume: (cm) 0 % Reduction in Area: 0 % Reduction in Volume: 0 0 0 Wound Description Classification: Full Thickness Without Exposed Support Exudate Amount: Medium Exudate Type: Serosanguineous Exudate Color: red, brown Structures Electronic Signature(s) Signed: 08/27/2022 11:12:46 AM By: Beth Carroll, BSN, RN, CWS, Kim RN, BSN Entered By: Beth Carroll, BSN, RN, CWS, Beth Carroll on 08/11/2022 10:16:26 [name] ([id number]) [id number] Page 4 of 5 -------------------------------------------------------------------------------- Wound Assessment Details Patient Name: Date of Service: Beth Carroll, Beth Carroll [facility] 07/05/2022 12:30 PM Medical Record Number: [id number] Patient Account Number: [id number] Date of Birth/Sex: Treating RN: 07-12-1970 (52 y.o. Beth Carroll, Beth Carroll Primary Care Roxanna Mcever: Beth Carroll Other Clinician: Referring Shebra Muldrow: Treating Ely Ballen/Extender: Beth Carroll in Treatment: 8 Wound Status Wound Number: 9 Primary Etiology: Open Surgical Wound Wound Location: Right Amputation Site - Toe Wound Status: Healed - Epithelialized Wounding Event: Surgical Injury Date Acquired: 06/03/2022 Weeks Of Treatment: 3 Clustered Wound: No Wound Measurements Length: (cm) Width: (cm) Depth: (cm) Area: (cm) Volume: (cm) 0 % Reduction in Area: 0 % Reduction in Volume: 0 0 0 Wound Description Classification: Full Thickness Without Exposed Support Exudate Amount: Medium Exudate Type: Serosanguineous Exudate Color: red, brown Structures Electronic Signature(s) Signed: 08/27/2022 11:12:46 AM By: Beth Carroll, BSN, RN, CWS, Kim RN, BSN Entered By: Beth Carroll, BSN, RN, CWS, Beth Carroll on 08/11/2022 10:16:26 -------------------------------------------------------------------------------- Vitals Details Patient Name: Date of Service: Beth Carroll, Beth Carroll [facility]  07/05/2022 12:30 PM Medical Record Number: [id number] Patient Account Number: [id number] Date of Birth/Sex: Treating RN: Sep 24, 1970 (52 y.o. [name] Primary Care Innocence Schlotzhauer: Beth Carroll Other Clinician: Referring Naraly Fritcher: Treating Rhina Kramme/Extender: Beth Carroll in Treatment: 8 Vital Signs Time Taken: 12:22 Pulse (bpm): 77 Height (in): 66 Respiratory Rate (breaths/min): 18 Weight (lbs): 118 Blood Pressure (mmHg): 57/43 Beth Carroll, Beth Carroll ([id number]) [phone].pdf Page 5 of 5 Body Mass Index (BMI): 19 Reference Range: 80 - 120 mg /  dl Airway Pulse Oximetry (%): 96 Notes per pt lightheaded and dizzy, pt states feels this way daily. Patient states BG this AM was high 260 range. PA Stone aware and in room. Patient sitting in wheelchair currently. 1224 re-check 59/41. HR regular. EMS to be called to take patient to ED due to status. Electronic Signature(s) Signed: 07/05/2022 4:07:36 PM By: Angelina Pih Entered By: Angelina Pih on 07/05/2022 12:31:41

## 2022-07-05 NOTE — Consult Note (Signed)
Pharmacy Antibiotic Note  Beth Carroll is a 52 y.o. female admitted on 07/05/2022 with  hypotension and concern for diabetic foot infection . Patient recently had right big toe amputation and was placed on doxycycline for prophylactic infectious coverage. Was seen by the podiatrist today with noted lower limit of normal blood pressures. And was redirected to the ER for further evaluation. Pharmacy has been consulted for Vancomycin and Cefepime dosing.  Plan: 1) Vancomycin 1000 mg IV x1 given in the ED as the loading dose Will order maintenance dose of  1250 mg IV Q 24 hrs. Goal AUC 400-550. Expected AUC: 522.9, Expected Cmin: 10.4 SCr used: 0.8(actual 0.54), Vd used: 0.72, TBW<IBW  2) Cefepime 2g IV Q8 hours   Height: 5\' 6"  (167.6 cm) Weight: 53.5 kg (118 lb) IBW/kg (Calculated) : 59.3  Temp (24hrs), Avg:97.7 F (36.5 C), Min:97.7 F (36.5 C), Max:97.7 F (36.5 C)  Recent Labs  Lab 07/05/22 1338  WBC 5.6  CREATININE 0.54  LATICACIDVEN 1.8    Estimated Creatinine Clearance: 69.5 mL/min (by C-G formula based on SCr of 0.54 mg/dL).    No Known Allergies  Antimicrobials this admission: Vancomycin 6/10 >>  Cefepime 6/10 >>  Zosyn 6/10 x 1  Dose adjustments this admission: N/A  Microbiology results: 6/10 BCx: collected 6/10 Wound cx: pending  Thank you for allowing pharmacy to be a part of this patient's care.  Bettey Costa 07/05/2022 5:44 PM

## 2022-07-05 NOTE — Assessment & Plan Note (Signed)
No active chest pain Continue home regimen  

## 2022-07-05 NOTE — Assessment & Plan Note (Signed)
BP stable Titrate home regimen 

## 2022-07-05 NOTE — Assessment & Plan Note (Signed)
Noted right foot redness and swelling with recent 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 Wyn Quaker Plain films concern for possible osteomyelitis Noted to be on oral doxycycline empirically Case discussed with Dr. Wyn Quaker Will place on IV cefepime, Flagyl and vancomycin for infectious coverage Blood cultures drawn in ER Wound culture x 1 MRI of the foot to better assess Follow-up on formal vascular surgery recommendations in the morning Follow

## 2022-07-05 NOTE — Assessment & Plan Note (Signed)
On Eliquis, aspirin, Plavix indefinitely per vascular surgery Continue

## 2022-07-05 NOTE — Assessment & Plan Note (Signed)
Stable from respiratory standpoint Continue home inhalers 

## 2022-07-06 ENCOUNTER — Other Ambulatory Visit: Payer: Self-pay | Admitting: Medical

## 2022-07-06 DIAGNOSIS — L089 Local infection of the skin and subcutaneous tissue, unspecified: Secondary | ICD-10-CM | POA: Diagnosis not present

## 2022-07-06 DIAGNOSIS — E11628 Type 2 diabetes mellitus with other skin complications: Secondary | ICD-10-CM | POA: Diagnosis not present

## 2022-07-06 DIAGNOSIS — I4891 Unspecified atrial fibrillation: Secondary | ICD-10-CM | POA: Insufficient documentation

## 2022-07-06 LAB — CBC
HCT: 28.2 % — ABNORMAL LOW (ref 36.0–46.0)
Hemoglobin: 8.5 g/dL — ABNORMAL LOW (ref 12.0–15.0)
MCH: 23.4 pg — ABNORMAL LOW (ref 26.0–34.0)
MCHC: 30.1 g/dL (ref 30.0–36.0)
MCV: 77.7 fL — ABNORMAL LOW (ref 80.0–100.0)
Platelets: 250 10*3/uL (ref 150–400)
RBC: 3.63 MIL/uL — ABNORMAL LOW (ref 3.87–5.11)
RDW: 14.5 % (ref 11.5–15.5)
WBC: 5.5 10*3/uL (ref 4.0–10.5)
nRBC: 0 % (ref 0.0–0.2)

## 2022-07-06 LAB — GLUCOSE, CAPILLARY
Glucose-Capillary: 294 mg/dL — ABNORMAL HIGH (ref 70–99)
Glucose-Capillary: 226 mg/dL — ABNORMAL HIGH (ref 70–99)
Glucose-Capillary: 113 mg/dL — ABNORMAL HIGH (ref 70–99)
Glucose-Capillary: 371 mg/dL — ABNORMAL HIGH (ref 70–99)

## 2022-07-06 LAB — COMPREHENSIVE METABOLIC PANEL
ALT: 12 U/L (ref 0–44)
AST: 13 U/L — ABNORMAL LOW (ref 15–41)
Albumin: 3.1 g/dL — ABNORMAL LOW (ref 3.5–5.0)
Alkaline Phosphatase: 88 U/L (ref 38–126)
Anion gap: 6 (ref 5–15)
BUN: 16 mg/dL (ref 6–20)
CO2: 26 mmol/L (ref 22–32)
Calcium: 8.6 mg/dL — ABNORMAL LOW (ref 8.9–10.3)
Chloride: 104 mmol/L (ref 98–111)
Creatinine, Ser: 0.38 mg/dL — ABNORMAL LOW (ref 0.44–1.00)
GFR, Estimated: >60 mL/min (ref >60)
Glucose, Bld: 341 mg/dL — ABNORMAL HIGH (ref 70–99)
Potassium: 3.4 mmol/L — ABNORMAL LOW (ref 3.5–5.1)
Sodium: 136 mmol/L (ref 135–145)
Total Bilirubin: 0.6 mg/dL (ref 0.3–1.2)
Total Protein: 6.6 g/dL (ref 6.5–8.1)

## 2022-07-06 LAB — C-REACTIVE PROTEIN: CRP: <0.5 mg/dL (ref <1.0)

## 2022-07-06 LAB — PREALBUMIN: Prealbumin: 12 mg/dL — ABNORMAL LOW (ref 18–38)

## 2022-07-06 MED ORDER — ENSURE ENLIVE PO LIQD
237.0000 mL | Freq: Three times a day (TID) | ORAL | Status: DC
  Administered 2022-07-06: 237 mL via ORAL

## 2022-07-06 MED ORDER — VITAMIN C 500 MG PO TABS
500.0000 mg | ORAL_TABLET | Freq: Two times a day (BID) | ORAL | Status: DC
  Administered 2022-07-06 – 2022-07-14 (×17): 500 mg via ORAL
  Filled 2022-07-06 (×17): qty 1

## 2022-07-06 MED ORDER — FUROSEMIDE 20 MG PO TABS
20.0000 mg | ORAL_TABLET | Freq: Every day | ORAL | Status: DC
  Administered 2022-07-06 – 2022-07-14 (×9): 20 mg via ORAL
  Filled 2022-07-06 (×9): qty 1

## 2022-07-06 MED ORDER — ALBUTEROL SULFATE (2.5 MG/3ML) 0.083% IN NEBU: 3.0000 mL | INHALATION_SOLUTION | Freq: Four times a day (QID) | RESPIRATORY_TRACT | Status: DC | PRN

## 2022-07-06 MED ORDER — ZINC SULFATE 220 (50 ZN) MG PO CAPS
220.0000 mg | ORAL_CAPSULE | Freq: Every day | ORAL | Status: DC
  Administered 2022-07-06 – 2022-07-13 (×8): 220 mg via ORAL
  Filled 2022-07-06 (×8): qty 1

## 2022-07-06 MED ORDER — BOOST / RESOURCE BREEZE PO LIQD CUSTOM
1.0000 | Freq: Three times a day (TID) | ORAL | Status: DC
  Administered 2022-07-06 – 2022-07-13 (×10): 1 via ORAL

## 2022-07-06 MED ORDER — AMLODIPINE BESYLATE 10 MG PO TABS
10.0000 mg | ORAL_TABLET | Freq: Every day | ORAL | Status: DC
  Administered 2022-07-06 – 2022-07-14 (×9): 10 mg via ORAL
  Filled 2022-07-06 (×9): qty 1

## 2022-07-06 NOTE — Progress Notes (Signed)
Initial Nutrition Assessment  DOCUMENTATION CODES:   Non-severe (moderate) malnutrition in context of chronic illness  INTERVENTION:   -Liberalize diet to carb modified for wider variety of meal selections -MVI with minerals daily -500 mg vitamin C BID -220 mg zinc sulfate daily x 14 days -Double protein portions with meals  NUTRITION DIAGNOSIS:   Moderate Malnutrition related to chronic illness (PAD) as evidenced by mild fat depletion, moderate fat depletion, moderate muscle depletion, severe muscle depletion.  GOAL:   Patient will meet greater than or equal to 90% of their needs  MONITOR:   PO intake, Supplement acceptance  REASON FOR ASSESSMENT:   Consult Wound healing  ASSESSMENT:   Pt with medical history significant of multiple medical issues including peripheral artery disease, hyperglycemia with type 2 diabetes, GERD, HFpEF, coronary artery disease presenting with right diabetic foot infection  Pt admitted with rt DM foot infection with possible osteomyelitis.   5/9- s/p right first and second toe metatarsal head amputation   Reviewed I/O's: +50 ml x 24 hours  Spoke with pt at bedside, who was pleasant and in good spirits today. Pt reports feeling better and desires to go home. Pt reports good appetite, consuming 100% of breakfast (oatmeal). Pt reports eating well PTA (pt daughter lives with her and prepares meals); she consumes 3 meals per day (Breakfast: oatmeal and eggs; Lunch: sandwich; Dinner: meat, starch, and vegetable).    Pt reports taking multiple vitamins, including centrum MVI. Discussed vitamins that are conditional for wound healing' Pt amenable to both vitamins and supplements. Pt reports good CBGS control, but reports concern over high blood sugars in hospital. Discussed how DM is managed in the hospital as well as how acute illness affexts blood sugar secondary to stress response.   Pt awaiting vascular surgery and podiatry consults.   Reviewed  wt hx; wt has been stable over the past 2 months. Pt denies any weight loss.   Medications reviewed and include vitamin C, lasix, and flagyl.   Lab Results  Component Value Date   HGBA1C 9.8 (H) 04/23/2022   PTA DM medications are 30 units insulin glargine daily and 8 units insulin aspart TID.   Labs reviewed: K: 3.4, CBGS: 113-226 (inpatient orders for glycemic control are 0-15 units insulin aspart TID with meals, 0-5 units insulin aspart daily at bedtime, 4 units insulin aspart TID with meals, and 20 units insulin glargine-yfgn daily).    NUTRITION - FOCUSED PHYSICAL EXAM:  Flowsheet Row Most Recent Value  Orbital Region Mild depletion  Upper Arm Region Mild depletion  Thoracic and Lumbar Region No depletion  Buccal Region No depletion  Temple Region Moderate depletion  Clavicle Bone Region Severe depletion  Clavicle and Acromion Bone Region Severe depletion  Scapular Bone Region Severe depletion  Dorsal Hand Moderate depletion  Patellar Region Moderate depletion  Anterior Thigh Region Moderate depletion  Posterior Calf Region Moderate depletion  Edema (RD Assessment) Mild  Hair Reviewed  Eyes Reviewed  Mouth Reviewed  Skin Reviewed  Nails Reviewed       Diet Order:   Diet Order             Diet Carb Modified Fluid consistency: Thin  Diet effective now                   EDUCATION NEEDS:   Education needs have been addressed  Skin:  Skin Assessment: Reviewed RN Assessment  Last BM:  07/05/22 (type 1)  Height:   Ht Readings from  Last 1 Encounters:  07/05/22 5\' 6"  (1.676 m)    Weight:   Wt Readings from Last 1 Encounters:  07/05/22 53.5 kg    Ideal Body Weight:  59.1 kg  BMI:  Body mass index is 19.05 kg/m.  Estimated Nutritional Needs:   Kcal:  1700-1900  Protein:  90-105 grams  Fluid:  > 1.7 L    Levada Schilling, RD, LDN, CDCES Registered Dietitian II Certified Diabetes Care and Education Specialist Please refer to [facility] for RD and/or  RD on-call/weekend/after hours pager

## 2022-07-06 NOTE — Consult Note (Signed)
Hospital Consult    Reason for Consult:  Right Foot Post Amputation Infection Requesting Physician:  Dr Lurene Shadow MD  MRN #:  [id number]  History of Present Illness: This is a 52 y.o. female Beth Carroll is a 52 y.o. female with medical history significant of multiple medical issues including peripheral artery disease, hyperglycemia with type 2 diabetes, GERD, HFpEF, coronary artery disease presenting with right diabetic foot infection.  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  Patient reports fairly stable postoperative follow-up.  Was placed on course of doxycycline for prophylactic infectious coverage.  On 04/24/2022 patient underwent angiogram by Dr. Renne Crigler.  1 month later the patient's ABIs were normal.  Upon exam this morning patient is resting comfortably in bed.  Patient is noted to have second third toe amputation of the right foot.  Gauze was over the top of the incision line.  Sutures are still intact.  No hematoma seroma or infection draining from the incision line noted.  Bilateral feet were warm to touch this morning.  Unable to palpate posttibial or dorsalis pedis pulses due to +2 edema bilaterally.  Patient's only complaint is left foot drop in which she is requesting some sort of brace to help.  She states if her foot stays in this position for a long time it becomes painful.    Past Medical History:  Diagnosis Date   Acute deep vein thrombosis (DVT) of right lower extremity (HCC)    ADHD (attention deficit hyperactivity disorder)    AKI (acute kidney injury) (HCC) 04/30/2022   Aortic atherosclerosis (HCC)    COPD (chronic obstructive pulmonary disease) (HCC)    Coronary artery disease 12/15/2020   a.) LHC/PCI 12/15/2020: 99% mLAD (2.25 x 18 mm Onyx Frontier DES), 90% RI (2.75 x 22 mm Onyx Frontier DES), 40% m-dLAD   Diabetes mellitus without complication (HCC)    Diabetes  mellitus, type 2 (HCC)    GERD (gastroesophageal reflux disease)    HFrEF (heart failure with reduced ejection fraction) (HCC)    a.) TTE 12/10/2020: EF 40-45%, apical/periapical HK, G2DD; b.) LHC 12/15/2020: EF 35-45%; c.) TTE 06/26/2021: EF 40-45%, LVH, G2DD; d.) TTE 03/21/2022: EF 55-60%, LV/apical sep/ant wall HK, Triv MR, AoV sclerosis   High anion gap metabolic acidosis 04/30/2022   Hyperlipemia    Hypertension    Hypophosphatemia 04/30/2022   Ischemic cardiomyopathy    a.) TTE 12/10/2020: EF 40-45%; b.) LHC 12/15/2020: EF 35-45%; c.) TTE 06/26/2021: EF 40-45%; d.) TTE 03/21/2022: EF 55-60%   Meningioma (HCC)    a.) CT head and MRI brain 08/21/2021: LEFT parietal meningioma   Myocardial infarction (HCC)    Nausea vomiting and diarrhea 04/30/2022   Orthostatic hypotension    PAD (peripheral artery disease) (HCC)    Sepsis secondary to UTI (HCC) 04/30/2022   Tobacco use     Past Surgical History:  Procedure Laterality Date   AMPUTATION Right 06/03/2022   Procedure: AMPUTATION RAY ( 1ST AND 2ND TOE);  Surgeon: Annice Needy, MD;  Location: [facility];  Service: Vascular;  Laterality: Right;   CHOLECYSTECTOMY     CORONARY STENT INTERVENTION N/A 12/15/2020   Procedure: CORONARY STENT INTERVENTION;  Surgeon: Iran Ouch, MD;  Location: [facility];  Service: Cardiovascular;  Laterality: N/A;   LEFT HEART CATH AND CORONARY ANGIOGRAPHY N/A 12/15/2020   Procedure: LEFT HEART CATH AND CORONARY ANGIOGRAPHY;  Surgeon: Kirke Corin,  Chelsea Aus, MD;  Location: [facility];  Service: Cardiovascular;  Laterality: N/A;   LOWER EXTREMITY ANGIOGRAPHY Left 12/10/2020   Procedure: Lower Extremity Angiography;  Surgeon: Annice Needy, MD;  Location: [facility];  Service: Cardiovascular;  Laterality: Left;   LOWER EXTREMITY ANGIOGRAPHY Right 12/25/2020   Procedure: LOWER EXTREMITY ANGIOGRAPHY;  Surgeon: Annice Needy, MD;  Location: [facility];  Service: Cardiovascular;   Laterality: Right;   LOWER EXTREMITY ANGIOGRAPHY Right 08/19/2021   Procedure: Lower Extremity Angiography;  Surgeon: Annice Needy, MD;  Location: [facility];  Service: Cardiovascular;  Laterality: Right;   LOWER EXTREMITY ANGIOGRAPHY Right 08/19/2021   Procedure: Lower Extremity Angiography;  Surgeon: Annice Needy, MD;  Location: [facility];  Service: Cardiovascular;  Laterality: Right;   LOWER EXTREMITY ANGIOGRAPHY Right 04/24/2022   Procedure: Lower Extremity Angiography;  Surgeon: Learta Codding, MD;  Location: [facility];  Service: Cardiovascular;  Laterality: Right;   ORIF FEMUR FRACTURE Right 12/14/2021   Procedure: OPEN REDUCTION INTERNAL FIXATION RIGHT DISTAL FEMUR;  Surgeon: Roby Lofts, MD;  Location: [facility];  Service: Orthopedics;  Laterality: Right;    No Known Allergies  Prior to Admission medications   Medication Sig Start Date End Date Taking? Authorizing Provider  acetaminophen (TYLENOL) 650 MG CR tablet Take 650 mg by mouth every 8 (eight) hours as needed for pain.   Yes [provider]  albuterol (VENTOLIN HFA) 108 (90 Base) MCG/ACT inhaler Inhale 2 puffs into the lungs every 6 (six) hours as needed for wheezing or shortness of breath.   Yes [provider]  apixaban (ELIQUIS) 5 MG TABS tablet Take 1 tablet (5 mg total) by mouth 2 (two) times daily. 05/19/22  Yes Georgiana Spinner, NP  aspirin EC 81 MG tablet Take 81 mg by mouth daily. Swallow whole.   Yes [provider]  atorvastatin (LIPITOR) 80 MG tablet Take 1 tablet (80 mg total) by mouth daily. 04/27/22 07/26/22 Yes Darlin Priestly, MD  barrier cream (NON-SPECIFIED) CREA Apply 1 Application topically 2 (two) times daily as needed. 03/22/22  Yes Sreenath, Sudheer B, MD  calcium carbonate (TUMS - DOSED IN MG ELEMENTAL CALCIUM) 500 MG chewable tablet Chew 1 tablet (200 mg of elemental calcium total) by mouth 3 (three) times daily before meals. 04/28/22  Yes Esaw Grandchild A, DO   clopidogrel (PLAVIX) 75 MG tablet Take 1 tablet (75 mg total) by mouth daily. 04/28/22 07/27/22 Yes Darlin Priestly, MD  doxycycline (VIBRAMYCIN) 100 MG capsule Take 1 capsule (100 mg total) by mouth 2 (two) times daily. 06/25/22  Yes Georgiana Spinner, NP  insulin aspart (NOVOLOG) 100 UNIT/ML injection Inject 8 Units into the skin 3 (three) times daily before meals.   Yes [provider]  insulin glargine (LANTUS SOLOSTAR) 100 UNIT/ML Solostar Pen Inject 30 Units into the skin at bedtime.   Yes [provider]  Multiple Vitamins-Minerals (CENTRUM SILVER 50+WOMEN PO) Take 1 tablet by mouth daily.   Yes [provider]  pantoprazole (PROTONIX) 40 MG tablet Take 1 tablet (40 mg total) by mouth daily. 12/17/20  Yes Elgergawy, Leana Roe, MD  tiotropium (SPIRIVA) 18 MCG inhalation capsule Place 1 capsule into inhaler and inhale daily. 09/24/21  Yes [provider]  feeding supplement (ENSURE ENLIVE / ENSURE PLUS) LIQD Take 237 mLs by mouth 3 (three) times daily between meals. 12/18/21   Calvert Cantor, MD  furosemide (LASIX) 20 MG tablet  Take 20 mg by mouth daily. Patient not taking: Reported on 07/05/2022 05/08/22   [provider]  nutrition supplement, JUVEN, (JUVEN) PACK Take 1 packet by mouth 2 (two) times daily between meals. 06/04/22   Aribella Vavra, Peggye Ley, NP  oxyCODONE-acetaminophen (PERCOCET/ROXICET) 5-325 MG tablet Take 1 tablet by mouth every 6 (six) hours as needed for moderate pain. Patient not taking: Reported on 07/05/2022 06/25/22   Georgiana Spinner, NP  Vitamin D, Ergocalciferol, (DRISDOL) 1.25 MG (50000 UNIT) CAPS capsule Take 50,000 Units by mouth once a week. 10/28/20   [provider]    Social History   Socioeconomic History   Marital status: Single    Spouse name: Not on file   Number of children: Not on file   Years of education: Not on file   Highest education level: Not on file  Occupational History   Not on file  Tobacco Use   Smoking  status: Former    Types: Cigarettes    Quit date: 12/09/2020    Years since quitting: 1.5   Smokeless tobacco: Never  Vaping Use   Vaping Use: Never used  Substance and Sexual Activity   Alcohol use: Never   Drug use: Not on file   Sexual activity: Not on file  Other Topics Concern   Not on file  Social History Narrative   Not on file   Social Determinants of Health   Financial Resource Strain: Not on file  Food Insecurity: No Food Insecurity (07/05/2022)   Hunger Vital Sign    Worried About Running Out of Food in the Last Year: Never true    Ran Out of Food in the Last Year: Never true  Transportation Needs: No Transportation Needs (07/05/2022)   PRAPARE - Administrator, Civil Service (Medical): No    Lack of Transportation (Non-Medical): No  Physical Activity: Not on file  Stress: Not on file  Social Connections: Not on file  Intimate Partner Violence: Not At Risk (07/05/2022)   Humiliation, Afraid, Rape, and Kick questionnaire    Fear of Current or Ex-Partner: No    Emotionally Abused: No    Physically Abused: No    Sexually Abused: No     Family History  Problem Relation Age of Onset   Hypertension Father     ROS: Otherwise negative unless mentioned in HPI  Physical Examination  Vitals:   07/05/22 2320 07/06/22 0736  BP: (!) 144/73 (!) 166/78  Pulse: 87 80  Resp: 18 16  Temp: 98.6 F (37 C) 99.1 F (37.3 C)  SpO2: 96% 100%   Body mass index is 19.05 kg/m.  General:  WDWN in NAD Gait: Not observed HENT: WNL, normocephalic Pulmonary: normal non-labored breathing, without Rales, rhonchi,  wheezing Cardiac: regular, without  Murmurs, rubs or gallops; without carotid bruits Abdomen: Positive Bowel sounds, soft, NT/ND, no masses Skin: without rashes Vascular Exam/Pulses: Bilateral upper extremities +2 radial, Bilateral lower extremities unable to palpate due to +2 edema.  Extremities: with ischemic changes, without Gangrene , without  cellulitis; without open wounds;  Musculoskeletal: no muscle wasting or atrophy  Neurologic: A&O X 3;  No focal weakness or paresthesias are detected; speech is fluent/normal Psychiatric:  The pt has Normal affect. Lymph:  Unremarkable  CBC    Component Value Date/Time   WBC 5.5 07/06/2022 0346   RBC 3.63 (L) 07/06/2022 0346   HGB 8.5 (L) 07/06/2022 0346   HCT 28.2 (L) 07/06/2022 0346   PLT 250 07/06/2022  0346   MCV 77.7 (L) 07/06/2022 0346   MCH 23.4 (L) 07/06/2022 0346   MCHC 30.1 07/06/2022 0346   RDW 14.5 07/06/2022 0346   LYMPHSABS 1.0 07/05/2022 1338   MONOABS 0.3 07/05/2022 1338   EOSABS 0.1 07/05/2022 1338   BASOSABS 0.0 07/05/2022 1338    BMET    Component Value Date/Time   NA 136 07/06/2022 0346   NA 137 03/18/2021 1541   K 3.4 (L) 07/06/2022 0346   CL 104 07/06/2022 0346   CO2 26 07/06/2022 0346   GLUCOSE 341 (H) 07/06/2022 0346   BUN 16 07/06/2022 0346   BUN 15 03/18/2021 1541   CREATININE 0.38 (L) 07/06/2022 0346   CALCIUM 8.6 (L) 07/06/2022 0346   GFRNONAA >60 07/06/2022 0346    COAGS: Lab Results  Component Value Date   INR 1.5 (H) 08/19/2021   INR 1.2 12/09/2020     Non-Invasive Vascular Imaging:   EXAM:07/05/2022 RIGHT FOOT COMPLETE - 3+ VIEW   COMPARISON:  None Available.   FINDINGS: The bones are osteopenic. There has been prior amputations of the first and second metatarsal heads and phalanges. There is some questionable minimal cortical erosions along the region of the second metatarsal neck. There is overlying soft tissue swelling. There is no acute fracture or dislocation identified. Plantar calcaneal spur is present. There is diffuse soft tissue swelling of the foot.   IMPRESSION: 1. Prior amputations of the first and second metatarsal heads and phalanges. 2. Questionable minimal cortical erosions along the region of the second metatarsal neck with overlying soft tissue swelling. Osteomyelitis cannot be excluded. 3. Diffuse  soft tissue swelling.  Statin:  Yes.   Beta Blocker:  No. Aspirin:  Yes.   ACEI:  No. ARB:  No. CCB use:  No Other antiplatelets/anticoagulants:  Yes.   Eliquis 5 mg twice daily and Plavix 75 mg daily   ASSESSMENT/PLAN: This is a 52 y.o. female with a medical history significant for multiple medical issues including peripheral artery disease, hyperglycemia with type 2 diabetes, GERD, heart failure preserved, coronary artery disease who presents to [facility] emergency department with right diabetic foot infection.  Patient is now 1 month postop right first and second toe metatarsal amputation.  She is placed on doxycycline for an infection postop.  Returns to the hospital today from the wound care clinic due to hypotension.  Vascular surgery was consulted to address right foot infection.  PLAN: Dr. Festus Barren MD and I reviewed the patient's MRI of the right foot and x-ray of the right foot this morning.  It appears that she does have some osteomyelitis to the right lower extremity.  Being that she just previously, 2 months ago had an angiogram with postoperative ABIs that were normal indicates she has got adequate blood flow for healing but may be having some difficulty.  Dr. Wyn Quaker would like vascular surgery to consult podiatry for their input regarding care of this patient's right foot.  I placed a consult to Dr. Linus Galas MD. I will enforce Dr. Sunnie Nielsen hospitalist in care of Ms. Sawa of the plan via secure chat.  -I discussed the plan in detail with Dr Festus Barren MD and he agrees with the plan.    Marcie [facility] 07/06/2022 7:52 AM

## 2022-07-06 NOTE — Telephone Encounter (Signed)
Received refill request for Farxiga. Beth Carroll was discontinued by Darlin Priestly MD on 05/15/22 for frequent UTI's and DKA. Please advise regarding refill.

## 2022-07-06 NOTE — Telephone Encounter (Signed)
Please advise on refill rquest.  Last seen by [facility] on 05/29/21 with Dr. Mariah Milling as primary cardiologist.  Did not have 3 month f/u as advised  Marcelline Deist was dc during recent hosital admission in 04/2022

## 2022-07-06 NOTE — Progress Notes (Addendum)
PROGRESS NOTE    Beth Carroll   ZOX:[id number] DOB: 1970-06-02  DOA: 07/05/2022 Date of Service: 07/06/22 PCP: Gracelyn Nurse, MD     Brief Narrative / Hospital Course:  Beth Carroll is a 52 y.o. female with medical history significant of multiple medical issues including peripheral artery disease, hyperglycemia with type 2 diabetes, GERD, HFpEF, coronary artery disease presenting to ED 07/05/2022 with right diabetic foot infection. Recent 1st and 2nd R toe amputations 06/03/2022 w/ Dr Wyn Quaker. Seen in follow up 05/31 in vascular office, some drainage and bleeding / serous discharge, advised elevation of limb and also Rx abx. Seen at wound care 06/03 and 06/10. On 06/10, pt reporting weakness/fatigue and noted to have low BP in wound clinic. was sent to ED.  06/10: Received fluids w/ EMS, arrived to ED afebrile, hemodynamically stable.  White count 5.6, hemoglobin 9.3, creatinine 0.5, glucose 308, lactate 1.8.  Right foot plain films with concern for possible osteomyelitis.  Chest x-ray grossly stable. EKG w/ atrial fibrillation.  06/11: normotensive/hypertensive. Vascular recs for podiatry eval, podiatry plans for I&D but need to be off Plavix and Eliquis so plan for 06/14. BCx pending.   Consultants:  Vascular surgery  Podiatry   Procedures: None       ASSESSMENT & PLAN:   Principal Problem:   Diabetic foot infection (HCC) Active Problems:   Hyperglycemia due to type 2 diabetes mellitus (HCC)   Chronic systolic CHF (congestive heart failure) (HCC)   Essential hypertension   PAD (peripheral artery disease) (HCC)   CAD S/P percutaneous coronary angioplasty   GERD without esophagitis   Dyslipidemia   COPD (chronic obstructive pulmonary disease) (HCC)   Atrial fibrillation (HCC)   Diabetic foot infection (HCC), possible osteomyelitis  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 Wyn Quaker IV cefepime, Flagyl and vancomycin for infectious  coverage Blood cultures pending  Wound culture x 1 I&D following plavix/Eliquis washout likely 06/14 Vascular surgery and podiatry teams following   Chronic systolic CHF (congestive heart failure) (HCC) 2D echo February 2024 with EF of 50 to 55% Appears euvolemic Monitor volume status closely  CAD S/P percutaneous coronary angioplasty No active chest pain Continue home ASA, atorvastatin   COPD (chronic obstructive pulmonary disease) (HCC) Stable, no acute exacerbation  Continue home inhalers  Essential hypertension BP stable Titrate home regimen as needed  Added amlodipine for now, may consider ACE/ARB +/- diuretic on discharge   GERD without esophagitis PPI  Hyperglycemia due to type 2 diabetes mellitus (HCC) Blood sugars in 300s on presentation Major contributor to wound complications which was discussed with the patient Continue home long-acting insulin Sliding-scale novolog meal times   PAD (peripheral artery disease) (HCC) On Eliquis, aspirin, Plavix indefinitely per vascular surgery - holding these pending I&D  Dyslipidemia Continue statin  Abnormal EKG Noted atrial fibrillation on EKG but substantial artifact, difficult to assess P waves  Had stable ECHO 02/2022  Cardiology consult vs outpatient f/u as clinically indicated.  Telemetry for now appears sinus rhythm     DVT prophylaxis: Eliquis  Pertinent IV fluids/nutrition: d/c continuous IV fluids  Central lines / invasive devices: none  Code Status: FULL CODE ACP documentation reviewed: 07/06/22 none on file [name]   Current Admission Status: inpatient   TOC needs / Dispo plan: anticipate HH, possible SNF rehab, PT/OT to assess following procedure  Barriers to discharge / significant pending items: surgery tomorrow  Subjective / Brief ROS:  Patient reports no concerns other than L foot drop requesting brace for this  Denies CP/SOB.  Pain controlled.  Denies new weakness.   Tolerating diet.  Reports no concerns w/ urination/defecation.   Family Communication: support person on phone during rounds     Objective Findings:  Vitals:   07/05/22 1850 07/05/22 2320 07/06/22 0736 07/06/22 1530  BP: (!) 191/87 (!) 144/73 (!) 166/78 (!) 162/87  Pulse: 95 87 80 73  Resp: 15 18 16 17   Temp: 98.7 F (37.1 C) 98.6 F (37 C) 99.1 F (37.3 C) 98.2 F (36.8 C)  TempSrc: Oral     SpO2: 98% 96% 100% 100%  Weight:      Height:        Intake/Output Summary (Last 24 hours) at 07/06/2022 1536 Last data filed at 07/06/2022 1300 Gross per 24 hour  Intake 1820 ml  Output 1100 ml  Net 720 ml   Filed Weights   07/05/22 1322  Weight: 53.5 kg    Examination:  Physical Exam Constitutional:      General: She is not in acute distress. Cardiovascular:     Rate and Rhythm: Normal rate and regular rhythm.  Pulmonary:     Effort: Pulmonary effort is normal.     Breath sounds: Normal breath sounds.  Musculoskeletal:        General: No tenderness.     Right lower leg: Edema present.     Left lower leg: Edema present.  Skin:    General: Skin is warm and dry.  Neurological:     General: No focal deficit present.     Mental Status: She is alert and oriented to person, place, and time.  Psychiatric:        Mood and Affect: Mood normal.        Behavior: Behavior normal.          Scheduled Medications:   amLODipine  10 mg Oral Daily   vitamin C  500 mg Oral BID   aspirin EC  81 mg Oral Daily   atorvastatin  80 mg Oral Daily   feeding supplement  1 Container Oral TID BM   furosemide  20 mg Oral Daily   insulin aspart  0-15 Units Subcutaneous TID WC   insulin aspart  0-5 Units Subcutaneous QHS   insulin aspart  4 Units Subcutaneous TID WC   insulin glargine-yfgn  20 Units Subcutaneous Daily   multivitamin with minerals  1 tablet Oral Daily   pantoprazole  40 mg Oral Daily   tiotropium  1 capsule Inhalation Daily   zinc sulfate  220 mg Oral Daily     Continuous Infusions:  ceFEPime (MAXIPIME) IV 2 g (07/06/22 1353)   metronidazole Stopped (07/06/22 1303)   vancomycin Stopped (07/06/22 1000)    PRN Medications:  acetaminophen **OR** acetaminophen, albuterol, HYDROcodone-acetaminophen, morphine injection, ondansetron **OR** ondansetron (ZOFRAN) IV  Antimicrobials from admission:  Anti-infectives (From admission, onward)    Start     Dose/Rate Route Frequency Ordered Stop   07/06/22 1000  vancomycin (VANCOREADY) IVPB 1250 mg/250 mL        1,250 mg 166.7 mL/hr over 90 Minutes Intravenous Every 24 hours 07/05/22 1922     07/05/22 2200  ceFEPIme (MAXIPIME) 2 g in sodium chloride 0.9 % 100 mL IVPB        2 g 200 mL/hr over 30 Minutes Intravenous Every 8 hours 07/05/22 1922     07/05/22 1745  metroNIDAZOLE (  FLAGYL) IVPB 500 mg        500 mg 100 mL/hr over 60 Minutes Intravenous Every 12 hours 07/05/22 1731 07/12/22 1744   07/05/22 1615  vancomycin (VANCOCIN) IVPB 1000 mg/200 mL premix        1,000 mg 200 mL/hr over 60 Minutes Intravenous  Once 07/05/22 1609 07/05/22 1822   07/05/22 1615  piperacillin-tazobactam (ZOSYN) IVPB 3.375 g        3.375 g 100 mL/hr over 30 Minutes Intravenous  Once 07/05/22 1609 07/05/22 1721           Data Reviewed:  I have personally reviewed the following...  CBC: Recent Labs  Lab 07/05/22 1338 07/06/22 0346  WBC 5.6 5.5  NEUTROABS 4.2  --   HGB 9.3* 8.5*  HCT 32.7* 28.2*  MCV 80.5 77.7*  PLT 281 250   Basic Metabolic Panel: Recent Labs  Lab 07/05/22 1338 07/06/22 0346  NA 134* 136  K 3.8 3.4*  CL 99 104  CO2 27 26  GLUCOSE 308* 341*  BUN 21* 16  CREATININE 0.54 0.38*  CALCIUM 9.2 8.6*   GFR: Estimated Creatinine Clearance: 69.5 mL/min (A) (by C-G formula based on SCr of 0.38 mg/dL (L)). Liver Function Tests: Recent Labs  Lab 07/05/22 1338 07/06/22 0346  AST 21 13*  ALT 16 12  ALKPHOS 104 88  BILITOT 0.7 0.6  PROT 7.4 6.6  ALBUMIN 3.6 3.1*   No results for  input(s): "LIPASE", "AMYLASE" in the last 168 hours. No results for input(s): "AMMONIA" in the last 168 hours. Coagulation Profile: No results for input(s): "INR", "PROTIME" in the last 168 hours. Cardiac Enzymes: No results for input(s): "CKTOTAL", "CKMB", "CKMBINDEX", "TROPONINI" in the last 168 hours. BNP (last 3 results) No results for input(s): "PROBNP" in the last 8760 hours. HbA1C: No results for input(s): "HGBA1C" in the last 72 hours. CBG: Recent Labs  Lab 07/05/22 2106 07/06/22 0737 07/06/22 1149  GLUCAP 467* 294* 226*   Lipid Profile: No results for input(s): "CHOL", "HDL", "LDLCALC", "TRIG", "CHOLHDL", "LDLDIRECT" in the last 72 hours. Thyroid Function Tests: No results for input(s): "TSH", "T4TOTAL", "FREET4", "T3FREE", "THYROIDAB" in the last 72 hours. Anemia Panel: No results for input(s): "VITAMINB12", "FOLATE", "FERRITIN", "TIBC", "IRON", "RETICCTPCT" in the last 72 hours. Most Recent Urinalysis On File:     Component Value Date/Time   COLORURINE YELLOW (A) 06/01/2022 1800   APPEARANCEUR HAZY (A) 06/01/2022 1800   APPEARANCEUR Clear 08/30/2012 0047   LABSPEC 1.008 06/01/2022 1800   LABSPEC 1.014 08/30/2012 0047   PHURINE 7.0 06/01/2022 1800   GLUCOSEU >=500 (A) 06/01/2022 1800   GLUCOSEU >=500 08/30/2012 0047   HGBUR MODERATE (A) 06/01/2022 1800   BILIRUBINUR NEGATIVE 06/01/2022 1800   BILIRUBINUR Negative 08/30/2012 0047   KETONESUR NEGATIVE 06/01/2022 1800   PROTEINUR NEGATIVE 06/01/2022 1800   NITRITE NEGATIVE 06/01/2022 1800   LEUKOCYTESUR LARGE (A) 06/01/2022 1800   LEUKOCYTESUR 1+ 08/30/2012 0047   Sepsis Labs: @LABRCNTIP (procalcitonin:4,lacticidven:4) Microbiology: Recent Results (from the past 240 hour(s))  Blood culture (routine x 2)     Status: None (Preliminary result)   Collection Time: 07/05/22  2:04 PM   Specimen: BLOOD  Result Value Ref Range Status   Specimen Description BLOOD BLOOD RIGHT FOREARM  Final   Special Requests   Final     BOTTLES DRAWN AEROBIC AND ANAEROBIC Blood Culture adequate volume   Culture   Final    NO GROWTH < 24 HOURS Performed at [facility], [address] [place], [place] [address]    Report Status PENDING  Incomplete  Blood culture (routine x 2)     Status: None (Preliminary result)   Collection Time: 07/05/22  2:04 PM   Specimen: BLOOD  Result Value Ref Range Status   Specimen Description BLOOD BLOOD RIGHT ARM  Final   Special Requests   Final    BOTTLES DRAWN AEROBIC AND ANAEROBIC Blood Culture results may not be optimal due to an excessive volume of blood received in culture bottles   Culture   Final    NO GROWTH < 24 HOURS Performed at [facility], [address] [place], [place] [address]    Report Status PENDING  Incomplete      Radiology Studies last 3 days: MR FOOT RIGHT W WO CONTRAST  Result Date: 07/06/2022 CLINICAL DATA:  Soft tissue infection suspected, foot, xray done rule out osteomyelitis EXAM: MRI OF THE RIGHT FOREFOOT WITHOUT AND WITH CONTRAST TECHNIQUE: Multiplanar, multisequence MR imaging of the right forefoot was performed before and after the administration of intravenous contrast. CONTRAST:  5mL GADAVIST GADOBUTROL 1 MMOL/ML IV SOLN COMPARISON:  X-ray 07/05/2022 FINDINGS: Bones/Joint/Cartilage Prior amputations of the great toe and second toe. Intense bone marrow edema and confluent low T1 signal changes within the residual first and second metatarsal heads and necks compatible with acute osteomyelitis. There is also osteomyelitis within the hallux sesamoids, more pronounced within the tibial hallux sesamoid. Patchy bone marrow edema throughout the remaining bones of the forefoot may be due to a combination of demineralization, stress related changes, and reactive osteitis. More serpiginous marrow signal alterations within the anterior calcaneus, navicular, and first metatarsal base could represent areas of bone infarction or developing  nondisplaced fractures. Ligaments Intact Lisfranc ligament.  No acute collateral ligament injury. Muscles and Tendons Appearance of the foot musculature suggest a combination of denervation and myositis. Amputation changes of the flexor and extensor tendons of the first and second toes. No well-defined tenosynovial fluid collection. Soft tissues Marked diffuse soft tissue swelling of the foot. No organized or drainable fluid collections. Foci of susceptibility within the soft tissues at the distal amputation margins may be postsurgical or represent a small amount of soft tissue gas. IMPRESSION: 1. Prior amputations of the great toe and second toe. Acute osteomyelitis of the residual first and second metatarsal heads and necks. 2. Osteomyelitis of the hallux sesamoids, more pronounced within the tibial hallux sesamoid. 3. Patchy bone marrow edema throughout the remaining bones of the forefoot may be due to a combination of demineralization, stress-related changes, and reactive osteitis. More serpiginous marrow signal alterations within the anterior calcaneus, navicular, and first metatarsal base could represent areas of bone infarction or developing nondisplaced fractures. 4. Marked diffuse soft tissue swelling of the foot. No organized or drainable fluid collections. 5. Foci of susceptibility within the soft tissues at the distal amputation margins may be postsurgical or represent a small amount of soft tissue gas. Electronically Signed   By: Duanne Guess D.O.   On: 07/06/2022 10:08   DG Chest 2 View  Result Date: 07/05/2022 CLINICAL DATA:  Weakness and hypotension.  Possible sepsis. EXAM: CHEST - 2 VIEW COMPARISON:  Chest radiograph 06/01/2022 FINDINGS: The cardiac silhouette is upper limits of normal in size. Aortic atherosclerosis is noted. The lungs are hyperinflated with mild interstitial coarsening. No airspace consolidation, edema, pleural effusion, or pneumothorax is identified. Right upper quadrant  abdominal surgical clips are noted. No acute osseous abnormality is seen. IMPRESSION: No active cardiopulmonary  disease. Electronically Signed   By: Sebastian Ache M.D.   On: 07/05/2022 16:21   DG Foot Complete Right  Result Date: 07/05/2022 CLINICAL DATA:  Wound care.  Weakness and fatigue. EXAM: RIGHT FOOT COMPLETE - 3+ VIEW COMPARISON:  None Available. FINDINGS: The bones are osteopenic. There has been prior amputations of the first and second metatarsal heads and phalanges. There is some questionable minimal cortical erosions along the region of the second metatarsal neck. There is overlying soft tissue swelling. There is no acute fracture or dislocation identified. Plantar calcaneal spur is present. There is diffuse soft tissue swelling of the foot. IMPRESSION: 1. Prior amputations of the first and second metatarsal heads and phalanges. 2. Questionable minimal cortical erosions along the region of the second metatarsal neck with overlying soft tissue swelling. Osteomyelitis cannot be excluded. 3. Diffuse soft tissue swelling. Electronically Signed   By: Darliss Cheney M.D.   On: 07/05/2022 15:54             LOS: 1 day        Sunnie Nielsen, DO [facility] 07/06/2022, 3:36 PM    Dictation software may have been used to generate the above note. Typos may occur and escape review in typed/dictated notes. Please contact Dr Lyn Hollingshead directly for clarity if needed.  Staff may message me via secure chat in Epic  but this may not receive an immediate response,  please page me for urgent matters!  If 7PM-7AM, please contact night coverage [email or web address]

## 2022-07-06 NOTE — Consult Note (Signed)
Reason for Consult: Osteomyelitis right foot. Referring Physician: Dr. Heywood Iles Hofmann is an 52 y.o. female.  HPI: This is a 52 year old female diabetic with associated neuropathy and peripheral vascular disease who had recent amputation of her right first and second toes.  The wound did not heal and dehisced and now there is evidence for osteomyelitis in the remaining first and second metatarsals.  Patient admitted for IV antibiotics and surgical planning.  Past Medical History:  Diagnosis Date   Acute deep vein thrombosis (DVT) of right lower extremity (HCC)    ADHD (attention deficit hyperactivity disorder)    AKI (acute kidney injury) (HCC) 04/30/2022   Aortic atherosclerosis (HCC)    COPD (chronic obstructive pulmonary disease) (HCC)    Coronary artery disease 12/15/2020   a.) LHC/PCI 12/15/2020: 99% mLAD (2.25 x 18 mm Onyx Frontier DES), 90% RI (2.75 x 22 mm Onyx Frontier DES), 40% m-dLAD   Diabetes mellitus without complication (HCC)    Diabetes mellitus, type 2 (HCC)    GERD (gastroesophageal reflux disease)    HFrEF (heart failure with reduced ejection fraction) (HCC)    a.) TTE 12/10/2020: EF 40-45%, apical/periapical HK, G2DD; b.) LHC 12/15/2020: EF 35-45%; c.) TTE 06/26/2021: EF 40-45%, LVH, G2DD; d.) TTE 03/21/2022: EF 55-60%, LV/apical sep/ant wall HK, Triv MR, AoV sclerosis   High anion gap metabolic acidosis 04/30/2022   Hyperlipemia    Hypertension    Hypophosphatemia 04/30/2022   Ischemic cardiomyopathy    a.) TTE 12/10/2020: EF 40-45%; b.) LHC 12/15/2020: EF 35-45%; c.) TTE 06/26/2021: EF 40-45%; d.) TTE 03/21/2022: EF 55-60%   Meningioma (HCC)    a.) CT head and MRI brain 08/21/2021: LEFT parietal meningioma   Myocardial infarction (HCC)    Nausea vomiting and diarrhea 04/30/2022   Orthostatic hypotension    PAD (peripheral artery disease) (HCC)    Sepsis secondary to UTI (HCC) 04/30/2022   Tobacco use     Past Surgical History:  Procedure Laterality Date    AMPUTATION Right 06/03/2022   Procedure: AMPUTATION RAY ( 1ST AND 2ND TOE);  Surgeon: Annice Needy, MD;  Location: [facility];  Service: Vascular;  Laterality: Right;   CHOLECYSTECTOMY     CORONARY STENT INTERVENTION N/A 12/15/2020   Procedure: CORONARY STENT INTERVENTION;  Surgeon: Iran Ouch, MD;  Location: [facility];  Service: Cardiovascular;  Laterality: N/A;   LEFT HEART CATH AND CORONARY ANGIOGRAPHY N/A 12/15/2020   Procedure: LEFT HEART CATH AND CORONARY ANGIOGRAPHY;  Surgeon: Iran Ouch, MD;  Location: [facility];  Service: Cardiovascular;  Laterality: N/A;   LOWER EXTREMITY ANGIOGRAPHY Left 12/10/2020   Procedure: Lower Extremity Angiography;  Surgeon: Annice Needy, MD;  Location: [facility];  Service: Cardiovascular;  Laterality: Left;   LOWER EXTREMITY ANGIOGRAPHY Right 12/25/2020   Procedure: LOWER EXTREMITY ANGIOGRAPHY;  Surgeon: Annice Needy, MD;  Location: [facility];  Service: Cardiovascular;  Laterality: Right;   LOWER EXTREMITY ANGIOGRAPHY Right 08/19/2021   Procedure: Lower Extremity Angiography;  Surgeon: Annice Needy, MD;  Location: [facility];  Service: Cardiovascular;  Laterality: Right;   LOWER EXTREMITY ANGIOGRAPHY Right 08/19/2021   Procedure: Lower Extremity Angiography;  Surgeon: Annice Needy, MD;  Location: [facility];  Service: Cardiovascular;  Laterality: Right;   LOWER EXTREMITY ANGIOGRAPHY Right 04/24/2022   Procedure: Lower Extremity Angiography;  Surgeon: Learta Codding, MD;  Location: [facility];  Service: Cardiovascular;  Laterality: Right;  ORIF FEMUR FRACTURE Right 12/14/2021   Procedure: OPEN REDUCTION INTERNAL FIXATION RIGHT DISTAL FEMUR;  Surgeon: Roby Lofts, MD;  Location: [facility];  Service: Orthopedics;  Laterality: Right;    Family History  Problem Relation Age of Onset   Hypertension Father     Social History:  reports that she quit smoking about 18 months  ago. Her smoking use included cigarettes. She has never used smokeless tobacco. She reports that she does not drink alcohol. No history on file for drug use.  Allergies: No Known Allergies  Medications: Scheduled:  amLODipine  10 mg Oral Daily   vitamin C  500 mg Oral BID   aspirin EC  81 mg Oral Daily   atorvastatin  80 mg Oral Daily   feeding supplement  1 Container Oral TID BM   furosemide  20 mg Oral Daily   insulin aspart  0-15 Units Subcutaneous TID WC   insulin aspart  0-5 Units Subcutaneous QHS   insulin aspart  4 Units Subcutaneous TID WC   insulin glargine-yfgn  20 Units Subcutaneous Daily   multivitamin with minerals  1 tablet Oral Daily   pantoprazole  40 mg Oral Daily   tiotropium  1 capsule Inhalation Daily   zinc sulfate  220 mg Oral Daily    Results for orders placed or performed during the hospital encounter of 07/05/22 (from the past 48 hour(s))  CBC with Differential     Status: Abnormal   Collection Time: 07/05/22  1:38 PM  Result Value Ref Range   WBC 5.6 4.0 - 10.5 K/uL   RBC 4.06 3.87 - 5.11 MIL/uL   Hemoglobin 9.3 (L) 12.0 - 15.0 g/dL   HCT 16.1 (L) 09.6 - 04.5 %   MCV 80.5 80.0 - 100.0 fL   MCH 22.9 (L) 26.0 - 34.0 pg   MCHC 28.4 (L) 30.0 - 36.0 g/dL   RDW 40.9 81.1 - 91.4 %   Platelets 281 150 - 400 K/uL   nRBC 0.0 0.0 - 0.2 %   Neutrophils Relative % 74 %   Neutro Abs 4.2 1.7 - 7.7 K/uL   Lymphocytes Relative 18 %   Lymphs Abs 1.0 0.7 - 4.0 K/uL   Monocytes Relative 5 %   Monocytes Absolute 0.3 0.1 - 1.0 K/uL   Eosinophils Relative 2 %   Eosinophils Absolute 0.1 0.0 - 0.5 K/uL   Basophils Relative 1 %   Basophils Absolute 0.0 0.0 - 0.1 K/uL   Immature Granulocytes 0 %   Abs Immature Granulocytes 0.02 0.00 - 0.07 K/uL    Comment: Performed at [facility], [address] [place], [place] [address]  Comprehensive metabolic panel     Status: Abnormal   Collection Time: 07/05/22  1:38 PM  Result Value Ref Range   Sodium 134 (L) 135  - 145 mmol/L   Potassium 3.8 3.5 - 5.1 mmol/L   Chloride 99 98 - 111 mmol/L   CO2 27 22 - 32 mmol/L   Glucose, Bld 308 (H) 70 - 99 mg/dL    Comment: Glucose reference range applies only to samples taken after fasting for at least 8 hours.   BUN 21 (H) 6 - 20 mg/dL   Creatinine, Ser 6.21 0.44 - 1.00 mg/dL   Calcium 9.2 8.9 - 30.8 mg/dL   Total Protein 7.4 6.5 - 8.1 g/dL   Albumin 3.6 3.5 - 5.0 g/dL   AST 21 15 - 41 U/L   ALT 16 0 - 44  U/L   Alkaline Phosphatase 104 38 - 126 U/L   Total Bilirubin 0.7 0.3 - 1.2 mg/dL   GFR, Estimated >16 >10 mL/min    Comment: (NOTE) Calculated using the CKD-EPI Creatinine Equation (2021)    Anion gap 8 5 - 15    Comment: Performed at [facility], [address] [place], [place] [address]  Lactic acid, plasma     Status: None   Collection Time: 07/05/22  1:38 PM  Result Value Ref Range   Lactic Acid, Venous 1.8 0.5 - 1.9 mmol/L    Comment: Performed at [facility], [address] [place], [place] [address]  Procalcitonin     Status: None   Collection Time: 07/05/22  1:38 PM  Result Value Ref Range   Procalcitonin <0.10 ng/mL    Comment:        Interpretation: PCT (Procalcitonin) <= 0.5 ng/mL: Systemic infection (sepsis) is not likely. Local bacterial infection is possible. (NOTE)       Sepsis PCT Algorithm           Lower Respiratory Tract                                      Infection PCT Algorithm    ----------------------------     ----------------------------         PCT < 0.25 ng/mL                PCT < 0.10 ng/mL          Strongly encourage             Strongly discourage   discontinuation of antibiotics    initiation of antibiotics    ----------------------------     -----------------------------       PCT 0.25 - 0.50 ng/mL            PCT 0.10 - 0.25 ng/mL               OR       >80% decrease in PCT            Discourage initiation of                                            antibiotics      Encourage  discontinuation           of antibiotics    ----------------------------     -----------------------------         PCT >= 0.50 ng/mL              PCT 0.26 - 0.50 ng/mL               AND        <80% decrease in PCT             Encourage initiation of                                             antibiotics       Encourage continuation           of antibiotics    ----------------------------     -----------------------------  PCT >= 0.50 ng/mL                  PCT > 0.50 ng/mL               AND         increase in PCT                  Strongly encourage                                      initiation of antibiotics    Strongly encourage escalation           of antibiotics                                     -----------------------------                                           PCT <= 0.25 ng/mL                                                 OR                                        > 80% decrease in PCT                                      Discontinue / Do not initiate                                             antibiotics  Performed at [facility], [address] [place], [place] [address]   Blood culture (routine x 2)     Status: None (Preliminary result)   Collection Time: 07/05/22  2:04 PM   Specimen: BLOOD  Result Value Ref Range   Specimen Description BLOOD BLOOD RIGHT FOREARM    Special Requests      BOTTLES DRAWN AEROBIC AND ANAEROBIC Blood Culture adequate volume   Culture      NO GROWTH < 24 HOURS Performed at [facility], [address] [place], [place] [address]    Report Status PENDING   Blood culture (routine x 2)     Status: None (Preliminary result)   Collection Time: 07/05/22  2:04 PM   Specimen: BLOOD  Result Value Ref Range   Specimen Description BLOOD BLOOD RIGHT ARM    Special Requests      BOTTLES DRAWN AEROBIC AND ANAEROBIC Blood Culture results may not be optimal due to an excessive volume of blood received in culture  bottles   Culture      NO GROWTH < 24 HOURS Performed at [facility], [address], [place], [place] [address]    Report Status PENDING  Lactic acid, plasma     Status: None   Collection Time: 07/05/22  7:03 PM  Result Value Ref Range   Lactic Acid, Venous 1.4 0.5 - 1.9 mmol/L    Comment: Performed at [facility], [address] [place], [place] [address]  Troponin I (High Sensitivity)     Status: None   Collection Time: 07/05/22  7:03 PM  Result Value Ref Range   Troponin I (High Sensitivity) 8 <18 ng/L    Comment: (NOTE) Elevated high sensitivity troponin I (hsTnI) values and significant  changes across serial measurements may suggest ACS but many other  chronic and acute conditions are known to elevate hsTnI results.  Refer to the "Links" section for chest pain algorithms and additional  guidance. Performed at [facility], [address] [place], [place] [address]   Sedimentation rate     Status: Abnormal   Collection Time: 07/05/22  7:03 PM  Result Value Ref Range   Sed Rate 48 (H) 0 - 30 mm/hr    Comment: Performed at [facility], [address] [place], [place] [address]  C-reactive protein     Status: None   Collection Time: 07/05/22  7:03 PM  Result Value Ref Range   CRP <0.5 <1.0 mg/dL    Comment: Performed at [facility], [address] [place], [place] [address]  Prealbumin     Status: Abnormal   Collection Time: 07/05/22  7:03 PM  Result Value Ref Range   Prealbumin 12 (L) 18 - 38 mg/dL    Comment: Performed at [facility], [address] [place], [place] [address]  Glucose, capillary     Status: Abnormal   Collection Time: 07/05/22  9:06 PM  Result Value Ref Range   Glucose-Capillary 467 (H) 70 - 99 mg/dL    Comment: Glucose reference range applies only to samples taken after fasting for at least 8 hours.  CBC     Status: Abnormal   Collection Time: 07/06/22  3:46 AM  Result Value Ref Range    WBC 5.5 4.0 - 10.5 K/uL   RBC 3.63 (L) 3.87 - 5.11 MIL/uL   Hemoglobin 8.5 (L) 12.0 - 15.0 g/dL    Comment: Reticulocyte Hemoglobin testing may be clinically indicated, consider ordering this additional test [id number]    HCT 28.2 (L) 36.0 - 46.0 %   MCV 77.7 (L) 80.0 - 100.0 fL   MCH 23.4 (L) 26.0 - 34.0 pg   MCHC 30.1 30.0 - 36.0 g/dL   RDW 41.3 24.4 - 01.0 %   Platelets 250 150 - 400 K/uL   nRBC 0.0 0.0 - 0.2 %    Comment: Performed at [facility], [address] [place], [place] [address]  Comprehensive metabolic panel     Status: Abnormal   Collection Time: 07/06/22  3:46 AM  Result Value Ref Range   Sodium 136 135 - 145 mmol/L   Potassium 3.4 (L) 3.5 - 5.1 mmol/L   Chloride 104 98 - 111 mmol/L   CO2 26 22 - 32 mmol/L   Glucose, Bld 341 (H) 70 - 99 mg/dL    Comment: Glucose reference range applies only to samples taken after fasting for at least 8 hours.   BUN 16 6 - 20 mg/dL   Creatinine, Ser 6.64 (L) 0.44 - 1.00 mg/dL   Calcium 8.6 (L) 8.9 - 10.3 mg/dL   Total Protein 6.6 6.5 - 8.1 g/dL   Albumin 3.1 (  L) 3.5 - 5.0 g/dL   AST 13 (L) 15 - 41 U/L   ALT 12 0 - 44 U/L   Alkaline Phosphatase 88 38 - 126 U/L   Total Bilirubin 0.6 0.3 - 1.2 mg/dL   GFR, Estimated >16 >10 mL/min    Comment: (NOTE) Calculated using the CKD-EPI Creatinine Equation (2021)    Anion gap 6 5 - 15    Comment: Performed at [facility], [address] [place], [place] [address]  Glucose, capillary     Status: Abnormal   Collection Time: 07/06/22  7:37 AM  Result Value Ref Range   Glucose-Capillary 294 (H) 70 - 99 mg/dL    Comment: Glucose reference range applies only to samples taken after fasting for at least 8 hours.  Glucose, capillary     Status: Abnormal   Collection Time: 07/06/22 11:49 AM  Result Value Ref Range   Glucose-Capillary 226 (H) 70 - 99 mg/dL    Comment: Glucose reference range applies only to samples taken after fasting for at least 8 hours.    MR FOOT  RIGHT W WO CONTRAST  Result Date: 07/06/2022 CLINICAL DATA:  Soft tissue infection suspected, foot, xray done rule out osteomyelitis EXAM: MRI OF THE RIGHT FOREFOOT WITHOUT AND WITH CONTRAST TECHNIQUE: Multiplanar, multisequence MR imaging of the right forefoot was performed before and after the administration of intravenous contrast. CONTRAST:  5mL GADAVIST GADOBUTROL 1 MMOL/ML IV SOLN COMPARISON:  X-ray 07/05/2022 FINDINGS: Bones/Joint/Cartilage Prior amputations of the great toe and second toe. Intense bone marrow edema and confluent low T1 signal changes within the residual first and second metatarsal heads and necks compatible with acute osteomyelitis. There is also osteomyelitis within the hallux sesamoids, more pronounced within the tibial hallux sesamoid. Patchy bone marrow edema throughout the remaining bones of the forefoot may be due to a combination of demineralization, stress related changes, and reactive osteitis. More serpiginous marrow signal alterations within the anterior calcaneus, navicular, and first metatarsal base could represent areas of bone infarction or developing nondisplaced fractures. Ligaments Intact Lisfranc ligament.  No acute collateral ligament injury. Muscles and Tendons Appearance of the foot musculature suggest a combination of denervation and myositis. Amputation changes of the flexor and extensor tendons of the first and second toes. No well-defined tenosynovial fluid collection. Soft tissues Marked diffuse soft tissue swelling of the foot. No organized or drainable fluid collections. Foci of susceptibility within the soft tissues at the distal amputation margins may be postsurgical or represent a small amount of soft tissue gas. IMPRESSION: 1. Prior amputations of the great toe and second toe. Acute osteomyelitis of the residual first and second metatarsal heads and necks. 2. Osteomyelitis of the hallux sesamoids, more pronounced within the tibial hallux sesamoid. 3. Patchy  bone marrow edema throughout the remaining bones of the forefoot may be due to a combination of demineralization, stress-related changes, and reactive osteitis. More serpiginous marrow signal alterations within the anterior calcaneus, navicular, and first metatarsal base could represent areas of bone infarction or developing nondisplaced fractures. 4. Marked diffuse soft tissue swelling of the foot. No organized or drainable fluid collections. 5. Foci of susceptibility within the soft tissues at the distal amputation margins may be postsurgical or represent a small amount of soft tissue gas. Electronically Signed   By: Duanne Guess D.O.   On: 07/06/2022 10:08   DG Chest 2 View  Result Date: 07/05/2022 CLINICAL DATA:  Weakness and hypotension.  Possible sepsis. EXAM: CHEST - 2 VIEW COMPARISON:  Chest radiograph 06/01/2022 FINDINGS: The cardiac silhouette is upper limits of normal in size. Aortic atherosclerosis is noted. The lungs are hyperinflated with mild interstitial coarsening. No airspace consolidation, edema, pleural effusion, or pneumothorax is identified. Right upper quadrant abdominal surgical clips are noted. No acute osseous abnormality is seen. IMPRESSION: No active cardiopulmonary disease. Electronically Signed   By: Sebastian Ache M.D.   On: 07/05/2022 16:21   DG Foot Complete Right  Result Date: 07/05/2022 CLINICAL DATA:  Wound care.  Weakness and fatigue. EXAM: RIGHT FOOT COMPLETE - 3+ VIEW COMPARISON:  None Available. FINDINGS: The bones are osteopenic. There has been prior amputations of the first and second metatarsal heads and phalanges. There is some questionable minimal cortical erosions along the region of the second metatarsal neck. There is overlying soft tissue swelling. There is no acute fracture or dislocation identified. Plantar calcaneal spur is present. There is diffuse soft tissue swelling of the foot. IMPRESSION: 1. Prior amputations of the first and second metatarsal heads  and phalanges. 2. Questionable minimal cortical erosions along the region of the second metatarsal neck with overlying soft tissue swelling. Osteomyelitis cannot be excluded. 3. Diffuse soft tissue swelling. Electronically Signed   By: Darliss Cheney M.D.   On: 07/05/2022 15:54    Review of Systems  Constitutional:  Negative for chills and fever.  HENT:  Negative for sinus pain and sore throat.   Respiratory:  Negative for cough and shortness of breath.   Cardiovascular:  Negative for chest pain and palpitations.  Gastrointestinal:  Negative for nausea and vomiting.  Genitourinary:  Negative for frequency and urgency.  Musculoskeletal:        Recently had amputation of her right first and second toes.  Skin:        Relates some drainage from the incision area on her right foot from her recent surgery.  Neurological:        Does relate neuropathy associated with her diabetes.  Psychiatric/Behavioral:  Negative for confusion. The patient is not nervous/anxious.    Blood pressure (!) 166/78, pulse 80, temperature 99.1 F (37.3 C), resp. rate 16, height 5\' 6"  (1.676 m), weight 53.5 kg, last menstrual period 01/26/2015, SpO2 100 %. Physical Exam Cardiovascular:     Comments: DP and PT pulses are palpable. Musculoskeletal:     Comments: Adequate range of motion of the pedal joints.  Recent amputation of the right first and second toes.  Muscle testing deferred.  Skin:    Comments: The skin is warm dry and mildly atrophic.  Bilateral edema present.  Lateral aspect of the incision over the second metatarsal site has dehisced with an open wound with mild drainage.  Cannot really express much fluid.  No significant cellulitis.  Neurological:     Comments: There is loss of protective sensation in the entire right foot to the level of the ankle as well as the digits on the left foot but present in the midfoot area.     Assessment/Plan: Assessment: 1.  Osteomyelitis right first and second  metatarsals. 2.  Diabetes with associated neuropathy. 3.  Peripheral vascular disease with recent intervention.  Plan: Discussed with the patient that the x-ray and MRI does show concern for osteomyelitis in the right first and second metatarsals at her previous amputation sites.  Discussed options which would include excision of a larger portion of the first and second metatarsal versus transmetatarsal amputation.  At this point I think we will proceed with a more limited procedure with  excision of more of the first and second metatarsals and see if pathology shows clean margins.  If not we may revise to transmetatarsal amputation.  Discussed possible risks and complications of the procedure including but not limited to inability of the wound to heal due to her diabetes, vascular status, continued infection.  Discussed that she is at risk for higher amputation if this does not heal.  No guarantees could be given as to the outcome of surgery.  Questions invited and answered.  Discussed proceeding with surgery but at this point we will most likely have to put this off until Friday afternoon as she did have her blood thinners this morning.  At this point we will plan for surgery on Friday  Ricci Barker 07/06/2022, 3:28 PM

## 2022-07-06 NOTE — Plan of Care (Signed)

## 2022-07-06 NOTE — Assessment & Plan Note (Signed)
Noted atrial fibrillation on EKG- rate controlled   Unclear if this is a new diagnosis On Anticoagulation w/ eliquis  Had stable ECHO 02/2022  Cardiology consult as clinically indicated.

## 2022-07-06 NOTE — Hospital Course (Addendum)
Beth Carroll is a 52 y.o. female with medical history significant of multiple medical issues including peripheral artery disease, hyperglycemia with type 2 diabetes, GERD, HFpEF, coronary artery disease presenting to ED 07/05/2022 with right diabetic foot infection. Recent 1st and 2nd R toe amputations 06/03/2022 w/ Dr Wyn Quaker. Seen in follow up 05/31 in vascular office, some drainage and bleeding / serous discharge, advised elevation of limb and also Rx abx. Seen at wound care 06/03 and 06/10. On 06/10, pt reporting weakness/fatigue and noted to have low BP in wound clinic. was sent to ED.  06/10: Possible osteomyelitis. Admitted to hospitalist service.  06/11: Vascular recs for podiatry eval, podiatry plans for I&D but need to be off Plavix and Eliquis so plan for 06/14.  06/12: BCx NGx2d. Wound Cx (+)GPC 06/13: Wound Cx mod staph aureus and few pseudomonas, pend suscept 06/14: abx to cipro. I&D today.   Consultants:  Vascular surgery  Podiatry   Procedures: [facility] 07/09/2022 I&D LLE w/ Podiatry at 16:00]      ASSESSMENT & PLAN:   Principal Problem:   Diabetic foot infection (HCC) Active Problems:   Hyperglycemia due to type 2 diabetes mellitus (HCC)   Chronic systolic CHF (congestive heart failure) (HCC)   Essential hypertension   PAD (peripheral artery disease) (HCC)   CAD S/P percutaneous coronary angioplasty   GERD without esophagitis   Dyslipidemia   COPD (chronic obstructive pulmonary disease) (HCC)   Atrial fibrillation (HCC)   Malnutrition of moderate degree   Diabetic foot infection (HCC) Cx(+) mod staph aureus and few pseudomonas 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 Wyn Quaker Abx transitioned to po cipro Blood cultures NGx2d I&D w/ podiatry, following plavix/Eliquis washout - planned for 06/14 Vascular surgery and podiatry teams following   Chronic systolic CHF (congestive heart failure) (HCC) 2D echo February 2024 with EF of 50  to 55% Appears euvolemic Monitor volume status closely  Anemia Monitor CBC Transfuse as needed / Hgb <7 or Hgb <8 w/ complications - pt has verbally consented to transfusion if needed   CAD S/P percutaneous coronary angioplasty No active chest pain Continue home ASA, atorvastatin   COPD (chronic obstructive pulmonary disease) (HCC) Stable, no acute exacerbation  Continue home inhalers  Essential hypertension BP stable Titrate home regimen as needed  Added amlodipine for now, may consider ACE/ARB +/- diuretic on discharge   GERD without esophagitis PPI  Hyperglycemia due to type 2 diabetes mellitus (HCC) Blood sugars in 300s on presentation Major contributor to wound complications which was discussed with the patient Continue home long-acting insulin - increased dose to 35 units daily   Sliding-scale novolog meal times   PAD (peripheral artery disease) (HCC) On Eliquis, aspirin, Plavix indefinitely per vascular surgery - holding Eliquis and Plavix pending I&D  Dyslipidemia Continue statin  Abnormal EKG Noted atrial fibrillation on EKG but substantial artifact, difficult to assess P waves, suspect actually NSR  Had stable ECHO 02/2022  Cardiology consult here if decompensates, vs outpatient f/u as clinically indicated.  Telemetry for now appears sinus rhythm     DVT prophylaxis: Eliquis  Pertinent IV fluids/nutrition: d/c continuous IV fluids  Central lines / invasive devices: none  Code Status: FULL CODE ACP documentation reviewed: 07/06/22 none on file [name]   Current Admission Status: inpatient   TOC needs / Dispo plan: anticipate [facility], possible SNF rehab, PT/OT to assess following procedure  Barriers to discharge / significant pending items: surgery 06/14 following Plavix/Eliquis washout

## 2022-07-06 NOTE — H&P (View-Only) (Signed)
Reason for Consult: Osteomyelitis right foot. Referring Physician: Dr. Dew  Beth Carroll is an 52 y.o. female.  HPI: This is a 52-year-old female diabetic with associated neuropathy and peripheral vascular disease who had recent amputation of her right first and second toes.  The wound did not heal and dehisced and now there is evidence for osteomyelitis in the remaining first and second metatarsals.  Patient admitted for IV antibiotics and surgical planning.  Past Medical History:  Diagnosis Date   Acute deep vein thrombosis (DVT) of right lower extremity (HCC)    ADHD (attention deficit hyperactivity disorder)    AKI (acute kidney injury) (HCC) 04/30/2022   Aortic atherosclerosis (HCC)    COPD (chronic obstructive pulmonary disease) (HCC)    Coronary artery disease 12/15/2020   a.) LHC/PCI 12/15/2020: 99% mLAD (2.25 x 18 mm Onyx Frontier DES), 90% RI (2.75 x 22 mm Onyx Frontier DES), 40% m-dLAD   Diabetes mellitus without complication (HCC)    Diabetes mellitus, type 2 (HCC)    GERD (gastroesophageal reflux disease)    HFrEF (heart failure with reduced ejection fraction) (HCC)    a.) TTE 12/10/2020: EF 40-45%, apical/periapical HK, G2DD; b.) LHC 12/15/2020: EF 35-45%; c.) TTE 06/26/2021: EF 40-45%, LVH, G2DD; d.) TTE 03/21/2022: EF 55-60%, LV/apical sep/ant wall HK, Triv MR, AoV sclerosis   High anion gap metabolic acidosis 04/30/2022   Hyperlipemia    Hypertension    Hypophosphatemia 04/30/2022   Ischemic cardiomyopathy    a.) TTE 12/10/2020: EF 40-45%; b.) LHC 12/15/2020: EF 35-45%; c.) TTE 06/26/2021: EF 40-45%; d.) TTE 03/21/2022: EF 55-60%   Meningioma (HCC)    a.) CT head and MRI brain 08/21/2021: LEFT parietal meningioma   Myocardial infarction (HCC)    Nausea vomiting and diarrhea 04/30/2022   Orthostatic hypotension    PAD (peripheral artery disease) (HCC)    Sepsis secondary to UTI (HCC) 04/30/2022   Tobacco use     Past Surgical History:  Procedure Laterality Date    AMPUTATION Right 06/03/2022   Procedure: AMPUTATION RAY ( 1ST AND 2ND TOE);  Surgeon: Dew, Jason S, MD;  Location: [facility];  Service: Vascular;  Laterality: Right;   CHOLECYSTECTOMY     CORONARY STENT INTERVENTION N/A 12/15/2020   Procedure: CORONARY STENT INTERVENTION;  Surgeon: Arida, Muhammad A, MD;  Location: [facility];  Service: Cardiovascular;  Laterality: N/A;   LEFT HEART CATH AND CORONARY ANGIOGRAPHY N/A 12/15/2020   Procedure: LEFT HEART CATH AND CORONARY ANGIOGRAPHY;  Surgeon: Arida, Muhammad A, MD;  Location: [facility];  Service: Cardiovascular;  Laterality: N/A;   LOWER EXTREMITY ANGIOGRAPHY Left 12/10/2020   Procedure: Lower Extremity Angiography;  Surgeon: Dew, Jason S, MD;  Location: [facility];  Service: Cardiovascular;  Laterality: Left;   LOWER EXTREMITY ANGIOGRAPHY Right 12/25/2020   Procedure: LOWER EXTREMITY ANGIOGRAPHY;  Surgeon: Dew, Jason S, MD;  Location: [facility];  Service: Cardiovascular;  Laterality: Right;   LOWER EXTREMITY ANGIOGRAPHY Right 08/19/2021   Procedure: Lower Extremity Angiography;  Surgeon: Dew, Jason S, MD;  Location: [facility];  Service: Cardiovascular;  Laterality: Right;   LOWER EXTREMITY ANGIOGRAPHY Right 08/19/2021   Procedure: Lower Extremity Angiography;  Surgeon: Dew, Jason S, MD;  Location: [facility];  Service: Cardiovascular;  Laterality: Right;   LOWER EXTREMITY ANGIOGRAPHY Right 04/24/2022   Procedure: Lower Extremity Angiography;  Surgeon: Pharr, Tarkten, MD;  Location: [facility];  Service: Cardiovascular;  Laterality: Right;     ORIF FEMUR FRACTURE Right 12/14/2021   Procedure: OPEN REDUCTION INTERNAL FIXATION RIGHT DISTAL FEMUR;  Surgeon: Haddix, Kevin P, MD;  Location: [facility];  Service: Orthopedics;  Laterality: Right;    Family History  Problem Relation Age of Onset   Hypertension Father     Social History:  reports that she quit smoking about 18 months  ago. Her smoking use included cigarettes. She has never used smokeless tobacco. She reports that she does not drink alcohol. No history on file for drug use.  Allergies: No Known Allergies  Medications: Scheduled:  amLODipine  10 mg Oral Daily   vitamin C  500 mg Oral BID   aspirin EC  81 mg Oral Daily   atorvastatin  80 mg Oral Daily   feeding supplement  1 Container Oral TID BM   furosemide  20 mg Oral Daily   insulin aspart  0-15 Units Subcutaneous TID WC   insulin aspart  0-5 Units Subcutaneous QHS   insulin aspart  4 Units Subcutaneous TID WC   insulin glargine-yfgn  20 Units Subcutaneous Daily   multivitamin with minerals  1 tablet Oral Daily   pantoprazole  40 mg Oral Daily   tiotropium  1 capsule Inhalation Daily   zinc sulfate  220 mg Oral Daily    Results for orders placed or performed during the hospital encounter of 07/05/22 (from the past 48 hour(s))  CBC with Differential     Status: Abnormal   Collection Time: 07/05/22  1:38 PM  Result Value Ref Range   WBC 5.6 4.0 - 10.5 K/uL   RBC 4.06 3.87 - 5.11 MIL/uL   Hemoglobin 9.3 (L) 12.0 - 15.0 g/dL   HCT 32.7 (L) 36.0 - 46.0 %   MCV 80.5 80.0 - 100.0 fL   MCH 22.9 (L) 26.0 - 34.0 pg   MCHC 28.4 (L) 30.0 - 36.0 g/dL   RDW 14.4 11.5 - 15.5 %   Platelets 281 150 - 400 K/uL   nRBC 0.0 0.0 - 0.2 %   Neutrophils Relative % 74 %   Neutro Abs 4.2 1.7 - 7.7 K/uL   Lymphocytes Relative 18 %   Lymphs Abs 1.0 0.7 - 4.0 K/uL   Monocytes Relative 5 %   Monocytes Absolute 0.3 0.1 - 1.0 K/uL   Eosinophils Relative 2 %   Eosinophils Absolute 0.1 0.0 - 0.5 K/uL   Basophils Relative 1 %   Basophils Absolute 0.0 0.0 - 0.1 K/uL   Immature Granulocytes 0 %   Abs Immature Granulocytes 0.02 0.00 - 0.07 K/uL    Comment: Performed at [facility], [address] [place], [place] [address]  Comprehensive metabolic panel     Status: Abnormal   Collection Time: 07/05/22  1:38 PM  Result Value Ref Range   Sodium 134 (L) 135  - 145 mmol/L   Potassium 3.8 3.5 - 5.1 mmol/L   Chloride 99 98 - 111 mmol/L   CO2 27 22 - 32 mmol/L   Glucose, Bld 308 (H) 70 - 99 mg/dL    Comment: Glucose reference range applies only to samples taken after fasting for at least 8 hours.   BUN 21 (H) 6 - 20 mg/dL   Creatinine, Ser 0.54 0.44 - 1.00 mg/dL   Calcium 9.2 8.9 - 10.3 mg/dL   Total Protein 7.4 6.5 - 8.1 g/dL   Albumin 3.6 3.5 - 5.0 g/dL   AST 21 15 - 41 U/L   ALT 16 0 - 44   U/L   Alkaline Phosphatase 104 38 - 126 U/L   Total Bilirubin 0.7 0.3 - 1.2 mg/dL   GFR, Estimated >60 >60 mL/min    Comment: (NOTE) Calculated using the CKD-EPI Creatinine Equation (2021)    Anion gap 8 5 - 15    Comment: Performed at [facility], [address] [place], [place] [address]  Lactic acid, plasma     Status: None   Collection Time: 07/05/22  1:38 PM  Result Value Ref Range   Lactic Acid, Venous 1.8 0.5 - 1.9 mmol/L    Comment: Performed at [facility], [address] [place], [place] [address]  Procalcitonin     Status: None   Collection Time: 07/05/22  1:38 PM  Result Value Ref Range   Procalcitonin <0.10 ng/mL    Comment:        Interpretation: PCT (Procalcitonin) <= 0.5 ng/mL: Systemic infection (sepsis) is not likely. Local bacterial infection is possible. (NOTE)       Sepsis PCT Algorithm           Lower Respiratory Tract                                      Infection PCT Algorithm    ----------------------------     ----------------------------         PCT < 0.25 ng/mL                PCT < 0.10 ng/mL          Strongly encourage             Strongly discourage   discontinuation of antibiotics    initiation of antibiotics    ----------------------------     -----------------------------       PCT 0.25 - 0.50 ng/mL            PCT 0.10 - 0.25 ng/mL               OR       >80% decrease in PCT            Discourage initiation of                                            antibiotics      Encourage  discontinuation           of antibiotics    ----------------------------     -----------------------------         PCT >= 0.50 ng/mL              PCT 0.26 - 0.50 ng/mL               AND        <80% decrease in PCT             Encourage initiation of                                             antibiotics       Encourage continuation           of antibiotics    ----------------------------     -----------------------------          PCT >= 0.50 ng/mL                  PCT > 0.50 ng/mL               AND         increase in PCT                  Strongly encourage                                      initiation of antibiotics    Strongly encourage escalation           of antibiotics                                     -----------------------------                                           PCT <= 0.25 ng/mL                                                 OR                                        > 80% decrease in PCT                                      Discontinue / Do not initiate                                             antibiotics  Performed at [facility], [address] [place], [place] [address]   Blood culture (routine x 2)     Status: None (Preliminary result)   Collection Time: 07/05/22  2:04 PM   Specimen: BLOOD  Result Value Ref Range   Specimen Description BLOOD BLOOD RIGHT FOREARM    Special Requests      BOTTLES DRAWN AEROBIC AND ANAEROBIC Blood Culture adequate volume   Culture      NO GROWTH < 24 HOURS Performed at [facility], [address] [place], [place] [address]    Report Status PENDING   Blood culture (routine x 2)     Status: None (Preliminary result)   Collection Time: 07/05/22  2:04 PM   Specimen: BLOOD  Result Value Ref Range   Specimen Description BLOOD BLOOD RIGHT ARM    Special Requests      BOTTLES DRAWN AEROBIC AND ANAEROBIC Blood Culture results may not be optimal due to an excessive volume of blood received in culture  bottles   Culture      NO GROWTH < 24 HOURS Performed at [facility], [address] [place], [place] [address]    Report Status PENDING     Lactic acid, plasma     Status: None   Collection Time: 07/05/22  7:03 PM  Result Value Ref Range   Lactic Acid, Venous 1.4 0.5 - 1.9 mmol/L    Comment: Performed at [facility], [address] [place], [place] [address]  Troponin I (High Sensitivity)     Status: None   Collection Time: 07/05/22  7:03 PM  Result Value Ref Range   Troponin I (High Sensitivity) 8 <18 ng/L    Comment: (NOTE) Elevated high sensitivity troponin I (hsTnI) values and significant  changes across serial measurements may suggest ACS but many other  chronic and acute conditions are known to elevate hsTnI results.  Refer to the "Links" section for chest pain algorithms and additional  guidance. Performed at [facility], [address] [place], [place] [address]   Sedimentation rate     Status: Abnormal   Collection Time: 07/05/22  7:03 PM  Result Value Ref Range   Sed Rate 48 (H) 0 - 30 mm/hr    Comment: Performed at [facility], [address] [place], [place] [address]  C-reactive protein     Status: None   Collection Time: 07/05/22  7:03 PM  Result Value Ref Range   CRP <0.5 <1.0 mg/dL    Comment: Performed at [facility], [address] [place], [place] [address]  Prealbumin     Status: Abnormal   Collection Time: 07/05/22  7:03 PM  Result Value Ref Range   Prealbumin 12 (L) 18 - 38 mg/dL    Comment: Performed at [facility], [address] [place], [place] [address]  Glucose, capillary     Status: Abnormal   Collection Time: 07/05/22  9:06 PM  Result Value Ref Range   Glucose-Capillary 467 (H) 70 - 99 mg/dL    Comment: Glucose reference range applies only to samples taken after fasting for at least 8 hours.  CBC     Status: Abnormal   Collection Time: 07/06/22  3:46 AM  Result Value Ref Range    WBC 5.5 4.0 - 10.5 K/uL   RBC 3.63 (L) 3.87 - 5.11 MIL/uL   Hemoglobin 8.5 (L) 12.0 - 15.0 g/dL    Comment: Reticulocyte Hemoglobin testing may be clinically indicated, consider ordering this additional test [id number]    HCT 28.2 (L) 36.0 - 46.0 %   MCV 77.7 (L) 80.0 - 100.0 fL   MCH 23.4 (L) 26.0 - 34.0 pg   MCHC 30.1 30.0 - 36.0 g/dL   RDW 14.5 11.5 - 15.5 %   Platelets 250 150 - 400 K/uL   nRBC 0.0 0.0 - 0.2 %    Comment: Performed at [facility], [address] [place], [place] [address]  Comprehensive metabolic panel     Status: Abnormal   Collection Time: 07/06/22  3:46 AM  Result Value Ref Range   Sodium 136 135 - 145 mmol/L   Potassium 3.4 (L) 3.5 - 5.1 mmol/L   Chloride 104 98 - 111 mmol/L   CO2 26 22 - 32 mmol/L   Glucose, Bld 341 (H) 70 - 99 mg/dL    Comment: Glucose reference range applies only to samples taken after fasting for at least 8 hours.   BUN 16 6 - 20 mg/dL   Creatinine, Ser 0.38 (L) 0.44 - 1.00 mg/dL   Calcium 8.6 (L) 8.9 - 10.3 mg/dL   Total Protein 6.6 6.5 - 8.1 g/dL   Albumin 3.1 (  L) 3.5 - 5.0 g/dL   AST 13 (L) 15 - 41 U/L   ALT 12 0 - 44 U/L   Alkaline Phosphatase 88 38 - 126 U/L   Total Bilirubin 0.6 0.3 - 1.2 mg/dL   GFR, Estimated >60 >60 mL/min    Comment: (NOTE) Calculated using the CKD-EPI Creatinine Equation (2021)    Anion gap 6 5 - 15    Comment: Performed at [facility], [address] [place], [place] [address]  Glucose, capillary     Status: Abnormal   Collection Time: 07/06/22  7:37 AM  Result Value Ref Range   Glucose-Capillary 294 (H) 70 - 99 mg/dL    Comment: Glucose reference range applies only to samples taken after fasting for at least 8 hours.  Glucose, capillary     Status: Abnormal   Collection Time: 07/06/22 11:49 AM  Result Value Ref Range   Glucose-Capillary 226 (H) 70 - 99 mg/dL    Comment: Glucose reference range applies only to samples taken after fasting for at least 8 hours.    MR FOOT  RIGHT W WO CONTRAST  Result Date: 07/06/2022 CLINICAL DATA:  Soft tissue infection suspected, foot, xray done rule out osteomyelitis EXAM: MRI OF THE RIGHT FOREFOOT WITHOUT AND WITH CONTRAST TECHNIQUE: Multiplanar, multisequence MR imaging of the right forefoot was performed before and after the administration of intravenous contrast. CONTRAST:  5mL GADAVIST GADOBUTROL 1 MMOL/ML IV SOLN COMPARISON:  X-ray 07/05/2022 FINDINGS: Bones/Joint/Cartilage Prior amputations of the great toe and second toe. Intense bone marrow edema and confluent low T1 signal changes within the residual first and second metatarsal heads and necks compatible with acute osteomyelitis. There is also osteomyelitis within the hallux sesamoids, more pronounced within the tibial hallux sesamoid. Patchy bone marrow edema throughout the remaining bones of the forefoot may be due to a combination of demineralization, stress related changes, and reactive osteitis. More serpiginous marrow signal alterations within the anterior calcaneus, navicular, and first metatarsal base could represent areas of bone infarction or developing nondisplaced fractures. Ligaments Intact Lisfranc ligament.  No acute collateral ligament injury. Muscles and Tendons Appearance of the foot musculature suggest a combination of denervation and myositis. Amputation changes of the flexor and extensor tendons of the first and second toes. No well-defined tenosynovial fluid collection. Soft tissues Marked diffuse soft tissue swelling of the foot. No organized or drainable fluid collections. Foci of susceptibility within the soft tissues at the distal amputation margins may be postsurgical or represent a small amount of soft tissue gas. IMPRESSION: 1. Prior amputations of the great toe and second toe. Acute osteomyelitis of the residual first and second metatarsal heads and necks. 2. Osteomyelitis of the hallux sesamoids, more pronounced within the tibial hallux sesamoid. 3. Patchy  bone marrow edema throughout the remaining bones of the forefoot may be due to a combination of demineralization, stress-related changes, and reactive osteitis. More serpiginous marrow signal alterations within the anterior calcaneus, navicular, and first metatarsal base could represent areas of bone infarction or developing nondisplaced fractures. 4. Marked diffuse soft tissue swelling of the foot. No organized or drainable fluid collections. 5. Foci of susceptibility within the soft tissues at the distal amputation margins may be postsurgical or represent a small amount of soft tissue gas. Electronically Signed   By: Nicholas  Plundo D.O.   On: 07/06/2022 10:08   DG Chest 2 View  Result Date: 07/05/2022 CLINICAL DATA:  Weakness and hypotension.  Possible sepsis. EXAM: CHEST - 2 VIEW COMPARISON:    Chest radiograph 06/01/2022 FINDINGS: The cardiac silhouette is upper limits of normal in size. Aortic atherosclerosis is noted. The lungs are hyperinflated with mild interstitial coarsening. No airspace consolidation, edema, pleural effusion, or pneumothorax is identified. Right upper quadrant abdominal surgical clips are noted. No acute osseous abnormality is seen. IMPRESSION: No active cardiopulmonary disease. Electronically Signed   By: Allen  Grady M.D.   On: 07/05/2022 16:21   DG Foot Complete Right  Result Date: 07/05/2022 CLINICAL DATA:  Wound care.  Weakness and fatigue. EXAM: RIGHT FOOT COMPLETE - 3+ VIEW COMPARISON:  None Available. FINDINGS: The bones are osteopenic. There has been prior amputations of the first and second metatarsal heads and phalanges. There is some questionable minimal cortical erosions along the region of the second metatarsal neck. There is overlying soft tissue swelling. There is no acute fracture or dislocation identified. Plantar calcaneal spur is present. There is diffuse soft tissue swelling of the foot. IMPRESSION: 1. Prior amputations of the first and second metatarsal heads  and phalanges. 2. Questionable minimal cortical erosions along the region of the second metatarsal neck with overlying soft tissue swelling. Osteomyelitis cannot be excluded. 3. Diffuse soft tissue swelling. Electronically Signed   By: [name]  Guttmann M.D.   On: 07/05/2022 15:54    Review of Systems  Constitutional:  Negative for chills and fever.  HENT:  Negative for sinus pain and sore throat.   Respiratory:  Negative for cough and shortness of breath.   Cardiovascular:  Negative for chest pain and palpitations.  Gastrointestinal:  Negative for nausea and vomiting.  Genitourinary:  Negative for frequency and urgency.  Musculoskeletal:        Recently had amputation of her right first and second toes.  Skin:        Relates some drainage from the incision area on her right foot from her recent surgery.  Neurological:        Does relate neuropathy associated with her diabetes.  Psychiatric/Behavioral:  Negative for confusion. The patient is not nervous/anxious.    Blood pressure (!) 166/78, pulse 80, temperature 99.1 F (37.3 C), resp. rate 16, height 5' 6" (1.676 m), weight 53.5 kg, last menstrual period 01/26/2015, SpO2 100 %. Physical Exam Cardiovascular:     Comments: DP and PT pulses are palpable. Musculoskeletal:     Comments: Adequate range of motion of the pedal joints.  Recent amputation of the right first and second toes.  Muscle testing deferred.  Skin:    Comments: The skin is warm dry and mildly atrophic.  Bilateral edema present.  Lateral aspect of the incision over the second metatarsal site has dehisced with an open wound with mild drainage.  Cannot really express much fluid.  No significant cellulitis.  Neurological:     Comments: There is loss of protective sensation in the entire right foot to the level of the ankle as well as the digits on the left foot but present in the midfoot area.     Assessment/Plan: Assessment: 1.  Osteomyelitis right first and second  metatarsals. 2.  Diabetes with associated neuropathy. 3.  Peripheral vascular disease with recent intervention.  Plan: Discussed with the patient that the x-ray and MRI does show concern for osteomyelitis in the right first and second metatarsals at her previous amputation sites.  Discussed options which would include excision of a larger portion of the first and second metatarsal versus transmetatarsal amputation.  At this point I think we will proceed with a more limited procedure with   excision of more of the first and second metatarsals and see if pathology shows clean margins.  If not we may revise to transmetatarsal amputation.  Discussed possible risks and complications of the procedure including but not limited to inability of the wound to heal due to her diabetes, vascular status, continued infection.  Discussed that she is at risk for higher amputation if this does not heal.  No guarantees could be given as to the outcome of surgery.  Questions invited and answered.  Discussed proceeding with surgery but at this point we will most likely have to put this off until Friday afternoon as she did have her blood thinners this morning.  At this point we will plan for surgery on Friday  Ashauna Bertholf W Lizbet Cirrincione 07/06/2022, 3:28 PM      

## 2022-07-06 NOTE — Inpatient Diabetes Management (Signed)
Inpatient Diabetes Program Recommendations  AACE/ADA: New Consensus Statement on Inpatient Glycemic Control   Target Ranges:  Prepandial:   less than 140 mg/dL      Peak postprandial:   less than 180 mg/dL (1-2 hours)      Critically ill patients:  140 - 180 mg/dL    Latest Reference Range & Units 07/05/22 20:00 07/05/22 21:06 07/05/22 22:12 07/06/22 07:37  Glucose-Capillary 70 - 99 mg/dL      Semglee 20 units 409 (H)      Novolog 5 units  Semglee 10 units  294 (H)  Novolog 12 units  Semglee 20 units @8 :14    Latest Reference Range & Units 07/05/22 13:38 07/06/22 03:46  Glucose 70 - 99 mg/dL 811 (H) 914 (H)    Latest Reference Range & Units 04/23/22 07:31  Hemoglobin A1C 4.8 - 5.6 % 9.8 (H)   Review of Glycemic Control  Diabetes history: DM2 Outpatient Diabetes medications: Lantus 30 units QHS, Novolog 8 units TID with meals Current orders for Inpatient glycemic control: Semglee 20 units daily, Novolog 0-15 units TID with meals, Novolog 0-5 units QHS, Novolog 4 units TID with meals   NOTE: Patient admitted with foot infection, hyperglycemia, and a-fib. Patient was inpatient 3/28-04/28/22 and inpatient diabetes coordinator spoke with patient on 04/22/22; inpatient again 5/9-5/10/24. Noted consult per lower extremity wound order set. Will follow along while inpatient.  Thanks, Orlando Penner, RN, MSN, CDCES Diabetes Coordinator Inpatient Diabetes Program [phone] (Team Pager from 8am to 5pm)

## 2022-07-07 DIAGNOSIS — E440 Moderate protein-calorie malnutrition: Secondary | ICD-10-CM | POA: Insufficient documentation

## 2022-07-07 DIAGNOSIS — E11628 Type 2 diabetes mellitus with other skin complications: Secondary | ICD-10-CM | POA: Diagnosis not present

## 2022-07-07 DIAGNOSIS — L089 Local infection of the skin and subcutaneous tissue, unspecified: Secondary | ICD-10-CM | POA: Diagnosis not present

## 2022-07-07 LAB — BASIC METABOLIC PANEL
Anion gap: 4 — ABNORMAL LOW (ref 5–15)
BUN: 16 mg/dL (ref 6–20)
CO2: 27 mmol/L (ref 22–32)
Calcium: 8.1 mg/dL — ABNORMAL LOW (ref 8.9–10.3)
Chloride: 105 mmol/L (ref 98–111)
Creatinine, Ser: 0.47 mg/dL (ref 0.44–1.00)
GFR, Estimated: >60 mL/min (ref >60)
Glucose, Bld: 395 mg/dL — ABNORMAL HIGH (ref 70–99)
Potassium: 3.4 mmol/L — ABNORMAL LOW (ref 3.5–5.1)
Sodium: 136 mmol/L (ref 135–145)

## 2022-07-07 LAB — CBC
HCT: 26.2 % — ABNORMAL LOW (ref 36.0–46.0)
Hemoglobin: 7.6 g/dL — ABNORMAL LOW (ref 12.0–15.0)
MCH: 23.1 pg — ABNORMAL LOW (ref 26.0–34.0)
MCHC: 29 g/dL — ABNORMAL LOW (ref 30.0–36.0)
MCV: 79.6 fL — ABNORMAL LOW (ref 80.0–100.0)
Platelets: 222 10*3/uL (ref 150–400)
RBC: 3.29 MIL/uL — ABNORMAL LOW (ref 3.87–5.11)
RDW: 14.6 % (ref 11.5–15.5)
WBC: 4.5 10*3/uL (ref 4.0–10.5)
nRBC: 0 % (ref 0.0–0.2)

## 2022-07-07 LAB — GLUCOSE, CAPILLARY
Glucose-Capillary: 318 mg/dL — ABNORMAL HIGH (ref 70–99)
Glucose-Capillary: 255 mg/dL — ABNORMAL HIGH (ref 70–99)
Glucose-Capillary: 225 mg/dL — ABNORMAL HIGH (ref 70–99)
Glucose-Capillary: 197 mg/dL — ABNORMAL HIGH (ref 70–99)

## 2022-07-07 LAB — CULTURE, BLOOD (ROUTINE X 2): Culture: NO GROWTH

## 2022-07-07 MED ORDER — INSULIN GLARGINE-YFGN 100 UNIT/ML ~~LOC~~ SOLN
30.0000 [IU] | Freq: Every day | SUBCUTANEOUS | Status: DC
  Administered 2022-07-07 – 2022-07-08 (×2): 30 [IU] via SUBCUTANEOUS
  Filled 2022-07-07 (×3): qty 0.3

## 2022-07-07 MED ORDER — DIPHENHYDRAMINE HCL 25 MG PO CAPS
25.0000 mg | ORAL_CAPSULE | Freq: Four times a day (QID) | ORAL | Status: DC | PRN
  Administered 2022-07-07 – 2022-07-14 (×13): 25 mg via ORAL
  Filled 2022-07-07 (×13): qty 1

## 2022-07-07 NOTE — Progress Notes (Signed)
OT Cancellation Note  Patient Details Name: Viviane Semidey MRN: [id number] DOB: 09-24-1970   Cancelled Treatment:    Reason Eval/Treat Not Completed: Medical issues which prohibited therapy. Per MD, "PT/OT to assess following procedure," which is scheduled for Friday, 07/09/2022. Will hold evaluation until pt undergoes surgery on that date.  Latina Craver 07/07/2022, 8:40 AM

## 2022-07-07 NOTE — Plan of Care (Signed)

## 2022-07-07 NOTE — TOC Initial Note (Signed)
Transition of Care [facility]) - Initial/Assessment Note    Patient Details  Name: Beth Carroll MRN: [id number] Date of Birth: 05/25/1970  [facility]) CM/SW Contact:    Garret Reddish, RN Phone Number: 07/07/2022, 2:39 PM  Clinical Narrative:    Chart reviewed.  Noted that patient was admitted with diabetic foot infection.  Noted that patient had recent amputation of right first/second toes on 06/03/22.  Noted that patient has evidence of Osteomyelitis.  Patient is currently on IV Cefepime, IV Flagyl and IV Vancomycin.  Podiatry and Vascular Surgery consulted.  Noted that patient will need  Pavix/Eliquis washout.  I and D with Podiatry planned for 07/09/22.    I have spoken with patient.  She reports that prior to admission patient lived at home with her daughter.  Patient reports that she was able to bath and dress herself.  Patient reported that she used Medicaid Transportation to get to appointments.  Patient reports that her PCP is Dr. Laural Benes with the [facility].  Patient reports that medications are affordable and that [facility] [place].  Patient reports that she has a 2 wheeled rolling walker, BSC, and manual wheelchair.    TOC will continue to follow for discharge planning.                 Expected Discharge Plan:  (Pending Medical work up) Barriers to Discharge: No Barriers Identified   Patient Goals and CMS Choice            Expected Discharge Plan and Services   Discharge Planning Services: CM Consult                                          Prior Living Arrangements/Services   Lives with:: Adult Children   Do you feel safe going back to the place where you live?: Yes        Care giver support system in place?: Yes (comment) (Patient has a supportive daughter) Current home services:  (Patient has a 2 wheeled rolling walker at home, [facility], and a manual wheelchair.)    Activities of Daily Living Home Assistive Devices/Equipment:  Wheelchair ADL Screening (condition at time of admission) Patient's cognitive ability adequate to safely complete daily activities?: Yes Is the patient deaf or have difficulty hearing?: No Does the patient have difficulty seeing, even when wearing glasses/contacts?: No Does the patient have difficulty concentrating, remembering, or making decisions?: No Patient able to express need for assistance with ADLs?: Yes Does the patient have difficulty dressing or bathing?: No Independently performs ADLs?: No Communication: Independent Dressing (OT): Independent Grooming: Independent Feeding: Independent Bathing: Independent Toileting: Needs assistance Is this a change from baseline?: Pre-admission baseline In/Out Bed: Needs assistance Is this a change from baseline?: Pre-admission baseline Walks in Home: Dependent Is this a change from baseline?: Pre-admission baseline Does the patient have difficulty walking or climbing stairs?: Yes Weakness of Legs: Both Weakness of Arms/Hands: Both  Permission Sought/Granted   Permission granted to share information with : Yes, Verbal Permission Granted              Emotional Assessment       Orientation: : Oriented to Self, Oriented to Place, Oriented to  Time, Oriented to Situation      Admission diagnosis:  Wound infection [T14.8XXA, L08.9] Diabetic foot infection (HCC) [W09.811, L08.9] Patient Active Problem List  Diagnosis Date Noted   Malnutrition of moderate degree 07/07/2022   Atrial fibrillation (HCC) 07/06/2022   Diabetic foot infection (HCC) 07/05/2022   Gangrene of right foot (HCC) 06/03/2022   COPD (chronic obstructive pulmonary disease) (HCC) 04/30/2022   Chronic wound 04/30/2022   DKA (diabetic ketoacidosis) (HCC) 04/21/2022   Hypotension 03/19/2022   Acute lower UTI (urinary tract infection) 03/19/2022   GERD without esophagitis 03/19/2022   Dyslipidemia 03/19/2022   Dehydration 03/19/2022   Septic shock due to  Escherichia coli (HCC) 12/17/2021   Acute on chronic combined systolic and diastolic CHF (congestive heart failure) (HCC) 12/17/2021   CAD S/P percutaneous coronary angioplasty 12/17/2021   DKA, type 2 (HCC) 12/13/2021   Acute metabolic encephalopathy 12/13/2021   Protein-calorie malnutrition, severe 12/12/2021   Closed right hip fracture (HCC) 12/08/2021   Traumatic closed fracture of femoral condyle with minimal displacement, unspecified laterality, initial encounter (HCC) 12/08/2021   Atherosclerosis of artery of extremity with rest pain (HCC)    Ischemic leg 08/19/2021   Chronic systolic CHF (congestive heart failure) (HCC) 12/22/2020   PAD (peripheral artery disease) (HCC) 12/22/2020   Ischemic cardiomyopathy    Acute systolic heart failure (HCC)    Hypertension    Rest pain of both lower extremities due to atherosclerosis (HCC)    Postural dizziness with presyncope    Hyperglycemia due to type 2 diabetes mellitus (HCC)    Hypokalemia    Tobacco abuse 10/23/2020   Type 2 diabetes mellitus without complications (HCC) 10/23/2020   Essential hypertension 10/23/2020   Stress fracture of left tibia 07/10/2019   Contusion of left knee 06/06/2019   Pain in left knee 05/08/2019   PCP:  Gracelyn Nurse, MD Pharmacy:   [phone] [place] - [address] [place] [address] Phone: [phone] Fax: [phone]     Social Determinants of Health (SDOH) Social History: SDOH Screenings   Food Insecurity: No Food Insecurity (07/05/2022)  Housing: Low Risk  (07/05/2022)  Transportation Needs: No Transportation Needs (07/05/2022)  Utilities: Not At Risk (07/05/2022)  Depression (PHQ2-9): Low Risk  (04/13/2021)  Tobacco Use: Medium Risk (07/05/2022)   SDOH Interventions:     Readmission Risk Interventions     No data to display

## 2022-07-07 NOTE — Progress Notes (Signed)
PROGRESS NOTE    Beth Carroll   ZOX:[id number] DOB: [birth year]  DOA: 07/05/2022 Date of Service: 07/07/22 PCP: Gracelyn Nurse, MD     Brief Narrative / Hospital Course:  Beth Carroll is a 52 y.o. female with medical history significant of multiple medical issues including peripheral artery disease, hyperglycemia with type 2 diabetes, GERD, HFpEF, coronary artery disease presenting to ED 07/05/2022 with right diabetic foot infection. Recent 1st and 2nd R toe amputations 06/03/2022 w/ Dr Wyn Quaker. Seen in follow up 05/31 in vascular office, some drainage and bleeding / serous discharge, advised elevation of limb and also Rx abx. Seen at wound care 06/03 and 06/10. On 06/10, pt reporting weakness/fatigue and noted to have low BP in wound clinic. was sent to ED.  06/10: Received fluids w/ EMS, arrived to ED afebrile, hemodynamically stable.  White count 5.6, hemoglobin 9.3, creatinine 0.5, glucose 308, lactate 1.8.  Right foot plain films with concern for possible osteomyelitis.  Chest x-ray grossly stable. EKG w/ atrial fibrillation.  06/11: normotensive/hypertensive. Vascular recs for podiatry eval, podiatry plans for I&D but need to be off Plavix and Eliquis so plan for 06/14. BCx pending.  06/12: BCx NGx2d. Wound Cx (+)GPC  Consultants:  Vascular surgery  Podiatry   Procedures: [facility] 07/09/2022 I&D LLE w/ Podiatry at 16:00]      ASSESSMENT & PLAN:   Principal Problem:   Diabetic foot infection (HCC) Active Problems:   Hyperglycemia due to type 2 diabetes mellitus (HCC)   Chronic systolic CHF (congestive heart failure) (HCC)   Essential hypertension   PAD (peripheral artery disease) (HCC)   CAD S/P percutaneous coronary angioplasty   GERD without esophagitis   Dyslipidemia   COPD (chronic obstructive pulmonary disease) (HCC)   Atrial fibrillation (HCC)   Diabetic foot infection (HCC), possible osteomyelitis  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 Wyn Quaker IV cefepime, Flagyl and vancomycin for infectious coverage Blood cultures NGx2d Wound culture x 1 (+)GPC, maintain abx as above  I&D w/ podiatry, following plavix/Eliquis washout - planned for 06/14 Vascular surgery and podiatry teams following   Chronic systolic CHF (congestive heart failure) (HCC) 2D echo February 2024 with EF of 50 to 55% Appears euvolemic Monitor volume status closely  Anemia Monitor CBC Transfuse as needed / Hgb <7 or Hgb <8 w/ complications - pt has verbally consented to transfusion if needed   CAD S/P percutaneous coronary angioplasty No active chest pain Continue home ASA, atorvastatin   COPD (chronic obstructive pulmonary disease) (HCC) Stable, no acute exacerbation  Continue home inhalers  Essential hypertension BP stable Titrate home regimen as needed  Added amlodipine for now, may consider ACE/ARB +/- diuretic on discharge   GERD without esophagitis PPI  Hyperglycemia due to type 2 diabetes mellitus (HCC) Blood sugars in 300s on presentation Major contributor to wound complications which was discussed with the patient Continue home long-acting insulin - increased dose to 30 units daily   Sliding-scale novolog meal times   PAD (peripheral artery disease) (HCC) On Eliquis, aspirin, Plavix indefinitely per vascular surgery - holding Eliquis and Plavix pending I&D  Dyslipidemia Continue statin  Abnormal EKG Noted atrial fibrillation on EKG but substantial artifact, difficult to assess P waves, suspect actually NSR  Had stable ECHO 02/2022  Cardiology consult here if decompensates, vs outpatient f/u as clinically indicated.  Telemetry for now appears sinus rhythm     DVT prophylaxis: Eliquis  Pertinent IV fluids/nutrition: d/c continuous IV fluids  Central lines / invasive devices: none  Code Status: FULL CODE ACP documentation reviewed: 07/06/22 none on file [name]   Current Admission Status: inpatient   TOC needs /  Dispo plan: anticipate [facility], possible SNF rehab, PT/OT to assess following procedure  Barriers to discharge / significant pending items: surgery 06/14 following Plavix/Eliquis washout               Subjective / Brief ROS:  Patient reports no concerns today Denies dizziness/lightheadedness   Denies CP/SOB.  Pain controlled.  Denies new weakness.  Tolerating diet.  Reports no concerns w/ urination/defecation.   Family Communication: none at this time     Objective Findings:  Vitals:   07/06/22 0736 07/06/22 1530 07/06/22 2059 07/07/22 0817  BP: (!) 166/78 (!) 162/87 (!) 142/71 138/77  Pulse: 80 73 80 78  Resp: 16 17 18 17   Temp: 99.1 F (37.3 C) 98.2 F (36.8 C) 98.3 F (36.8 C) 97.8 F (36.6 C)  TempSrc:      SpO2: 100% 100% 100% 98%  Weight:      Height:        Intake/Output Summary (Last 24 hours) at 07/07/2022 1022 Last data filed at 07/07/2022 0511 Gross per 24 hour  Intake 1015.28 ml  Output 800 ml  Net 215.28 ml   Filed Weights   07/05/22 1322  Weight: 53.5 kg    Examination:  Physical Exam Constitutional:      General: She is not in acute distress. Cardiovascular:     Rate and Rhythm: Normal rate and regular rhythm.  Pulmonary:     Effort: Pulmonary effort is normal.     Breath sounds: Normal breath sounds.  Musculoskeletal:        General: No tenderness.     Right lower leg: Edema present.     Left lower leg: Edema present.  Skin:    General: Skin is warm and dry.  Neurological:     General: No focal deficit present.     Mental Status: She is alert and oriented to person, place, and time.  Psychiatric:        Mood and Affect: Mood normal.        Behavior: Behavior normal.          Scheduled Medications:   amLODipine  10 mg Oral Daily   vitamin C  500 mg Oral BID   aspirin EC  81 mg Oral Daily   atorvastatin  80 mg Oral Daily   feeding supplement  1 Container Oral TID BM   furosemide  20 mg Oral Daily   insulin aspart   0-15 Units Subcutaneous TID WC   insulin aspart  0-5 Units Subcutaneous QHS   insulin aspart  4 Units Subcutaneous TID WC   insulin glargine-yfgn  30 Units Subcutaneous Daily   multivitamin with minerals  1 tablet Oral Daily   pantoprazole  40 mg Oral Daily   tiotropium  1 capsule Inhalation Daily   zinc sulfate  220 mg Oral Daily    Continuous Infusions:  ceFEPime (MAXIPIME) IV 2 g (07/07/22 1610)   metronidazole 100 mL/hr at 07/07/22 0511   vancomycin 1,250 mg (07/07/22 0935)    PRN Medications:  acetaminophen **OR** acetaminophen, albuterol, HYDROcodone-acetaminophen, morphine injection, ondansetron **OR** ondansetron (ZOFRAN) IV  Antimicrobials from admission:  Anti-infectives (From admission, onward)    Start     Dose/Rate Route Frequency Ordered Stop   07/06/22 1000  vancomycin (VANCOREADY) IVPB 1250 mg/250 mL  1,250 mg 166.7 mL/hr over 90 Minutes Intravenous Every 24 hours 07/05/22 1922     07/05/22 2200  ceFEPIme (MAXIPIME) 2 g in sodium chloride 0.9 % 100 mL IVPB        2 g 200 mL/hr over 30 Minutes Intravenous Every 8 hours 07/05/22 1922     07/05/22 1745  metroNIDAZOLE (FLAGYL) IVPB 500 mg        500 mg 100 mL/hr over 60 Minutes Intravenous Every 12 hours 07/05/22 1731 07/12/22 1744   07/05/22 1615  vancomycin (VANCOCIN) IVPB 1000 mg/200 mL premix        1,000 mg 200 mL/hr over 60 Minutes Intravenous  Once 07/05/22 1609 07/05/22 1822   07/05/22 1615  piperacillin-tazobactam (ZOSYN) IVPB 3.375 g        3.375 g 100 mL/hr over 30 Minutes Intravenous  Once 07/05/22 1609 07/05/22 1721           Data Reviewed:  I have personally reviewed the following...  CBC: Recent Labs  Lab 07/05/22 1338 07/06/22 0346 07/07/22 0351  WBC 5.6 5.5 4.5  NEUTROABS 4.2  --   --   HGB 9.3* 8.5* 7.6*  HCT 32.7* 28.2* 26.2*  MCV 80.5 77.7* 79.6*  PLT 281 250 222   Basic Metabolic Panel: Recent Labs  Lab 07/05/22 1338 07/06/22 0346 07/07/22 0351  NA 134* 136 136   K 3.8 3.4* 3.4*  CL 99 104 105  CO2 27 26 27   GLUCOSE 308* 341* 395*  BUN 21* 16 16  CREATININE 0.54 0.38* 0.47  CALCIUM 9.2 8.6* 8.1*   GFR: Estimated Creatinine Clearance: 69.5 mL/min (by C-G formula based on SCr of 0.47 mg/dL). Liver Function Tests: Recent Labs  Lab 07/05/22 1338 07/06/22 0346  AST 21 13*  ALT 16 12  ALKPHOS 104 88  BILITOT 0.7 0.6  PROT 7.4 6.6  ALBUMIN 3.6 3.1*   No results for input(s): "LIPASE", "AMYLASE" in the last 168 hours. No results for input(s): "AMMONIA" in the last 168 hours. Coagulation Profile: No results for input(s): "INR", "PROTIME" in the last 168 hours. Cardiac Enzymes: No results for input(s): "CKTOTAL", "CKMB", "CKMBINDEX", "TROPONINI" in the last 168 hours. BNP (last 3 results) No results for input(s): "PROBNP" in the last 8760 hours. HbA1C: No results for input(s): "HGBA1C" in the last 72 hours. CBG: Recent Labs  Lab 07/06/22 0737 07/06/22 1149 07/06/22 1620 07/06/22 2101 07/07/22 0817  GLUCAP 294* 226* 113* 371* 318*   Lipid Profile: No results for input(s): "CHOL", "HDL", "LDLCALC", "TRIG", "CHOLHDL", "LDLDIRECT" in the last 72 hours. Thyroid Function Tests: No results for input(s): "TSH", "T4TOTAL", "FREET4", "T3FREE", "THYROIDAB" in the last 72 hours. Anemia Panel: No results for input(s): "VITAMINB12", "FOLATE", "FERRITIN", "TIBC", "IRON", "RETICCTPCT" in the last 72 hours. Most Recent Urinalysis On File:     Component Value Date/Time   COLORURINE YELLOW (A) 06/01/2022 1800   APPEARANCEUR HAZY (A) 06/01/2022 1800   APPEARANCEUR Clear 08/30/2012 0047   LABSPEC 1.008 06/01/2022 1800   LABSPEC 1.014 08/30/2012 0047   PHURINE 7.0 06/01/2022 1800   GLUCOSEU >=500 (A) 06/01/2022 1800   GLUCOSEU >=500 08/30/2012 0047   HGBUR MODERATE (A) 06/01/2022 1800   BILIRUBINUR NEGATIVE 06/01/2022 1800   BILIRUBINUR Negative 08/30/2012 0047   KETONESUR NEGATIVE 06/01/2022 1800   PROTEINUR NEGATIVE 06/01/2022 1800    NITRITE NEGATIVE 06/01/2022 1800   LEUKOCYTESUR LARGE (A) 06/01/2022 1800   LEUKOCYTESUR 1+ 08/30/2012 0047   Sepsis Labs: @LABRCNTIP (procalcitonin:4,lacticidven:4) Microbiology: Recent Results (from the past 240  hour(s))  Blood culture (routine x 2)     Status: None (Preliminary result)   Collection Time: 07/05/22  2:04 PM   Specimen: BLOOD  Result Value Ref Range Status   Specimen Description BLOOD BLOOD RIGHT FOREARM  Final   Special Requests   Final    BOTTLES DRAWN AEROBIC AND ANAEROBIC Blood Culture adequate volume   Culture   Final    NO GROWTH 2 DAYS Performed at [facility], [address], [place], [place] [address]    Report Status PENDING  Incomplete  Blood culture (routine x 2)     Status: None (Preliminary result)   Collection Time: 07/05/22  2:04 PM   Specimen: BLOOD  Result Value Ref Range Status   Specimen Description BLOOD BLOOD RIGHT ARM  Final   Special Requests   Final    BOTTLES DRAWN AEROBIC AND ANAEROBIC Blood Culture results may not be optimal due to an excessive volume of blood received in culture bottles   Culture   Final    NO GROWTH 2 DAYS Performed at [facility], [address], [place], [place] [address]    Report Status PENDING  Incomplete  Aerobic/Anaerobic Culture w Gram Stain (surgical/deep wound)     Status: None (Preliminary result)   Collection Time: 07/05/22  3:40 PM   Specimen: Foot  Result Value Ref Range Status   Specimen Description   Final    FOOT Performed at [facility], [address] [place], [place] [address]    Special Requests   Final    RF Performed at [facility], [address] [place], [place] [address]    Gram Stain   Final    RARE WBC PRESENT, PREDOMINANTLY MONONUCLEAR RARE GRAM POSITIVE COCCI IN SINGLES    Culture   Final    TOO YOUNG TO READ Performed at [facility], [address] [place], [place] [address]    Report Status PENDING  Incomplete       Radiology Studies last 3 days: MR FOOT RIGHT W WO CONTRAST  Result Date: 07/06/2022 CLINICAL DATA:  Soft tissue infection suspected, foot, xray done rule out osteomyelitis EXAM: MRI OF THE RIGHT FOREFOOT WITHOUT AND WITH CONTRAST TECHNIQUE: Multiplanar, multisequence MR imaging of the right forefoot was performed before and after the administration of intravenous contrast. CONTRAST:  5mL GADAVIST GADOBUTROL 1 MMOL/ML IV SOLN COMPARISON:  X-ray 07/05/2022 FINDINGS: Bones/Joint/Cartilage Prior amputations of the great toe and second toe. Intense bone marrow edema and confluent low T1 signal changes within the residual first and second metatarsal heads and necks compatible with acute osteomyelitis. There is also osteomyelitis within the hallux sesamoids, more pronounced within the tibial hallux sesamoid. Patchy bone marrow edema throughout the remaining bones of the forefoot may be due to a combination of demineralization, stress related changes, and reactive osteitis. More serpiginous marrow signal alterations within the anterior calcaneus, navicular, and first metatarsal base could represent areas of bone infarction or developing nondisplaced fractures. Ligaments Intact Lisfranc ligament.  No acute collateral ligament injury. Muscles and Tendons Appearance of the foot musculature suggest a combination of denervation and myositis. Amputation changes of the flexor and extensor tendons of the first and second toes. No well-defined tenosynovial fluid collection. Soft tissues Marked diffuse soft tissue swelling of the foot. No organized or drainable fluid collections. Foci of susceptibility within the soft tissues at the distal amputation margins may be postsurgical or represent a small amount of soft tissue gas.  IMPRESSION: 1. Prior amputations of the great toe and second toe. Acute osteomyelitis of the residual first and second metatarsal heads and necks. 2. Osteomyelitis of the hallux sesamoids, more  pronounced within the tibial hallux sesamoid. 3. Patchy bone marrow edema throughout the remaining bones of the forefoot may be due to a combination of demineralization, stress-related changes, and reactive osteitis. More serpiginous marrow signal alterations within the anterior calcaneus, navicular, and first metatarsal base could represent areas of bone infarction or developing nondisplaced fractures. 4. Marked diffuse soft tissue swelling of the foot. No organized or drainable fluid collections. 5. Foci of susceptibility within the soft tissues at the distal amputation margins may be postsurgical or represent a small amount of soft tissue gas. Electronically Signed   By: Duanne Guess D.O.   On: 07/06/2022 10:08   DG Chest 2 View  Result Date: 07/05/2022 CLINICAL DATA:  Weakness and hypotension.  Possible sepsis. EXAM: CHEST - 2 VIEW COMPARISON:  Chest radiograph 06/01/2022 FINDINGS: The cardiac silhouette is upper limits of normal in size. Aortic atherosclerosis is noted. The lungs are hyperinflated with mild interstitial coarsening. No airspace consolidation, edema, pleural effusion, or pneumothorax is identified. Right upper quadrant abdominal surgical clips are noted. No acute osseous abnormality is seen. IMPRESSION: No active cardiopulmonary disease. Electronically Signed   By: Sebastian Ache M.D.   On: 07/05/2022 16:21   DG Foot Complete Right  Result Date: 07/05/2022 CLINICAL DATA:  Wound care.  Weakness and fatigue. EXAM: RIGHT FOOT COMPLETE - 3+ VIEW COMPARISON:  None Available. FINDINGS: The bones are osteopenic. There has been prior amputations of the first and second metatarsal heads and phalanges. There is some questionable minimal cortical erosions along the region of the second metatarsal neck. There is overlying soft tissue swelling. There is no acute fracture or dislocation identified. Plantar calcaneal spur is present. There is diffuse soft tissue swelling of the foot. IMPRESSION: 1.  Prior amputations of the first and second metatarsal heads and phalanges. 2. Questionable minimal cortical erosions along the region of the second metatarsal neck with overlying soft tissue swelling. Osteomyelitis cannot be excluded. 3. Diffuse soft tissue swelling. Electronically Signed   By: Darliss Cheney M.D.   On: 07/05/2022 15:54             LOS: 2 days        Sunnie Nielsen, DO [facility] 07/07/2022, 10:22 AM    Dictation software may have been used to generate the above note. Typos may occur and escape review in typed/dictated notes. Please contact Dr Lyn Hollingshead directly for clarity if needed.  Staff may message me via secure chat in Epic  but this may not receive an immediate response,  please page me for urgent matters!  If 7PM-7AM, please contact night coverage [email or web address]

## 2022-07-07 NOTE — Inpatient Diabetes Management (Signed)
Inpatient Diabetes Program Recommendations  AACE/ADA: New Consensus Statement on Inpatient Glycemic Control   Target Ranges:  Prepandial:   less than 140 mg/dL      Peak postprandial:   less than 180 mg/dL (1-2 hours)      Critically ill patients:  140 - 180 mg/dL    Latest Reference Range & Units 07/07/22 03:51  Glucose 70 - 99 mg/dL 161 (H)    Latest Reference Range & Units 07/06/22 07:37 07/06/22 11:49 07/06/22 16:20 07/06/22 21:01  Glucose-Capillary 70 - 99 mg/dL 096 (H) 045 (H) 409 (H) 371 (H)    Review of Glycemic Control  Diabetes history: DM2 Outpatient Diabetes medications: Lantus 30 units QHS, Novolog 8 units TID with meals Current orders for Inpatient glycemic control: Semglee 20 units daily, Novolog 0-15 units TID with meals, Novolog 0-5 units QHS, Novolog 4 units TID with meals  Inpatient Diabetes Program Recommendations:    Insulin: Novolog meal coverage was NOT given with supper last night (per chart patient at 90% of supper and CBG 113 mg/dl at 81:19). As a result, CBG up to 371 mg/dl at 14:78. Lab glucose 395 mg/dl today.  Please consider increasing Semglee to 30 units daily (to start today).   Thanks, Orlando Penner, RN, MSN, CDCES Diabetes Coordinator Inpatient Diabetes Program [phone] (Team Pager from 8am to 5pm)

## 2022-07-08 DIAGNOSIS — E11628 Type 2 diabetes mellitus with other skin complications: Secondary | ICD-10-CM | POA: Diagnosis not present

## 2022-07-08 DIAGNOSIS — L089 Local infection of the skin and subcutaneous tissue, unspecified: Secondary | ICD-10-CM | POA: Diagnosis not present

## 2022-07-08 LAB — CBC
HCT: 26.4 % — ABNORMAL LOW (ref 36.0–46.0)
Hemoglobin: 7.6 g/dL — ABNORMAL LOW (ref 12.0–15.0)
MCH: 22.9 pg — ABNORMAL LOW (ref 26.0–34.0)
MCHC: 28.8 g/dL — ABNORMAL LOW (ref 30.0–36.0)
MCV: 79.5 fL — ABNORMAL LOW (ref 80.0–100.0)
Platelets: 214 10*3/uL (ref 150–400)
RBC: 3.32 MIL/uL — ABNORMAL LOW (ref 3.87–5.11)
RDW: 14.7 % (ref 11.5–15.5)
WBC: 4.5 10*3/uL (ref 4.0–10.5)
nRBC: 0 % (ref 0.0–0.2)

## 2022-07-08 LAB — BASIC METABOLIC PANEL
Anion gap: 5 (ref 5–15)
BUN: 24 mg/dL — ABNORMAL HIGH (ref 6–20)
CO2: 24 mmol/L (ref 22–32)
Calcium: 8.6 mg/dL — ABNORMAL LOW (ref 8.9–10.3)
Chloride: 107 mmol/L (ref 98–111)
Creatinine, Ser: 0.53 mg/dL (ref 0.44–1.00)
GFR, Estimated: >60 mL/min (ref >60)
Glucose, Bld: 321 mg/dL — ABNORMAL HIGH (ref 70–99)
Potassium: 3.5 mmol/L (ref 3.5–5.1)
Sodium: 136 mmol/L (ref 135–145)

## 2022-07-08 LAB — GLUCOSE, CAPILLARY
Glucose-Capillary: 273 mg/dL — ABNORMAL HIGH (ref 70–99)
Glucose-Capillary: 92 mg/dL (ref 70–99)
Glucose-Capillary: 262 mg/dL — ABNORMAL HIGH (ref 70–99)
Glucose-Capillary: 325 mg/dL — ABNORMAL HIGH (ref 70–99)

## 2022-07-08 LAB — VANCOMYCIN, PEAK: Vancomycin Pk: 22 ug/mL — ABNORMAL LOW (ref 30–40)

## 2022-07-08 NOTE — Plan of Care (Signed)

## 2022-07-08 NOTE — Consult Note (Signed)
Pharmacy Antibiotic Note  Beth Carroll is a 52 y.o. female admitted on 07/05/2022 with  hypotension and concern for diabetic foot infection . Patient recently had right big toe amputation and was placed on doxycycline for prophylactic infectious coverage. Was seen by the podiatrist today with noted lower limit of normal blood pressures. And was redirected to the ER for further evaluation. Pharmacy has been consulted for Vancomycin and Cefepime dosing.  Plan: Day 4 of Abx 1) Continue vancomycin 1250 mg IV every 24 hours  Estimated AUC 522.9, Cmin 10.4  Wt TBW<IBW, Scr round to 0.8, Vd coefficient 0.72  2) Continue cefepime 2g IV Q8 hours  3) Vancomycin Peak ordered for 6/13 @ 1300 and Vancomycin trough ordered for 6/14 @ 0900.  Pharmacy will follow renal function and levels for adjustments   Height: 5\' 6"  (167.6 cm) Weight: 53.5 kg (118 lb) IBW/kg (Calculated) : 59.3  Temp (24hrs), Avg:98.3 F (36.8 C), Min:97.7 F (36.5 C), Max:99.2 F (37.3 C)  Recent Labs  Lab 07/05/22 1338 07/05/22 1903 07/06/22 0346 07/07/22 0351 07/08/22 0357  WBC 5.6  --  5.5 4.5 4.5  CREATININE 0.54  --  0.38* 0.47 0.53  LATICACIDVEN 1.8 1.4  --   --   --      Estimated Creatinine Clearance: 69.5 mL/min (by C-G formula based on SCr of 0.53 mg/dL).    No Known Allergies  Antimicrobials this admission: Vancomycin 6/10 >>  Cefepime 6/10 >>  Flagyl 6/10 >> Zosyn 6/10 x 1  Dose adjustments this admission: N/A  Microbiology results: 6/10 BCx: ngtd 6/10 Wound cx: pending  Thank you for allowing pharmacy to be a part of this patient's care.  Barrie Folk, PharmD 07/08/2022 8:20 AM

## 2022-07-08 NOTE — Inpatient Diabetes Management (Signed)
Inpatient Diabetes Program Recommendations  AACE/ADA: New Consensus Statement on Inpatient Glycemic Control (2015)  Target Ranges:  Prepandial:   less than 140 mg/dL      Peak postprandial:   less than 180 mg/dL (1-2 hours)      Critically ill patients:  140 - 180 mg/dL    Latest Reference Range & Units 07/07/22 08:17 07/07/22 11:42 07/07/22 16:16 07/07/22 21:35  Glucose-Capillary 70 - 99 mg/dL 604 (H)  15 units Novolog @0940   30 units Semglee @0941  255 (H)  12 units Novolog @1312  225 (H)  9 units Novolog @1714  197 (H)  (H): Data is abnormally high  Latest Reference Range & Units 07/08/22 07:41  Glucose-Capillary 70 - 99 mg/dL 540 (H)  12 units Novolog   (H): Data is abnormally high     Home DM Meds: Lantus 30 units QHS        Novolog 8 units TID with meals   Current Orders: Semglee 30 units Daily     Novolog Moderate Correction Scale/ SSI (0-15 units) TID AC + HS     Novolog 4 units TID with meals    MD- Note CBG 273 this AM.    Please consider:  1. Increase Semglee to 35 units Daily (if 30 unit dose already given this AM, please also give an extra 5 units Semglee X 1 dose this AM as well)   2. Increase Novolog Meal Coverage to 8 units TID with meals (home dose)     --Will follow patient during hospitalization--  Ambrose Finland RN, MSN, CDCES Diabetes Coordinator Inpatient Glycemic Control Team Team Pager: [phone] (8a-5p)

## 2022-07-08 NOTE — Progress Notes (Signed)
PROGRESS NOTE    Beth Carroll   ZOX:[id number] DOB: 1971/01/23  DOA: 07/05/2022 Date of Service: 07/08/22 PCP: Gracelyn Nurse, MD     Brief Narrative / Hospital Course:  Beth Carroll is a 52 y.o. female with medical history significant of multiple medical issues including peripheral artery disease, hyperglycemia with type 2 diabetes, GERD, HFpEF, coronary artery disease presenting to ED 07/05/2022 with right diabetic foot infection. Recent 1st and 2nd R toe amputations 06/03/2022 w/ Dr Wyn Quaker. Seen in follow up 05/31 in vascular office, some drainage and bleeding / serous discharge, advised elevation of limb and also Rx abx. Seen at wound care 06/03 and 06/10. On 06/10, pt reporting weakness/fatigue and noted to have low BP in wound clinic. was sent to ED.  06/10: to ED. Right foot plain films with concern for possible osteomyelitis. Admitted to hospitalist service.  06/11: Vascular recs for podiatry eval, podiatry plans for I&D but need to be off Plavix and Eliquis so plan for 06/14.  06/12: BCx NGx2d. Wound Cx (+)GPC 06/13: Wound Cx mod staph aureus and few pseudomonas   Consultants:  Vascular surgery  Podiatry   Procedures: [facility] 07/09/2022 I&D LLE w/ Podiatry at 16:00]      ASSESSMENT & PLAN:   Principal Problem:   Diabetic foot infection (HCC) Active Problems:   Hyperglycemia due to type 2 diabetes mellitus (HCC)   Chronic systolic CHF (congestive heart failure) (HCC)   Essential hypertension   PAD (peripheral artery disease) (HCC)   CAD S/P percutaneous coronary angioplasty   GERD without esophagitis   Dyslipidemia   COPD (chronic obstructive pulmonary disease) (HCC)   Atrial fibrillation (HCC)   Diabetic foot infection (HCC), possible osteomyelitis  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 Wyn Quaker IV cefepime, Flagyl and vancomycin for infectious coverage Blood cultures NGx2d Wound culture as above pending susceptibilities   I&D w/ podiatry, following plavix/Eliquis washout - planned for 06/14 Vascular surgery and podiatry teams following   Chronic systolic CHF (congestive heart failure) (HCC) 2D echo February 2024 with EF of 50 to 55% Appears euvolemic Monitor volume status closely  Anemia Monitor CBC Transfuse as needed / Hgb <7 or Hgb <8 w/ complications - pt has verbally consented to transfusion if needed   CAD S/P percutaneous coronary angioplasty No active chest pain Continue home ASA, atorvastatin   COPD (chronic obstructive pulmonary disease) (HCC) Stable, no acute exacerbation  Continue home inhalers  Essential hypertension BP stable Titrate home regimen as needed  Added amlodipine for now, may consider ACE/ARB +/- diuretic on discharge   GERD without esophagitis PPI  Hyperglycemia due to type 2 diabetes mellitus (HCC) Blood sugars in 300s on presentation Major contributor to wound complications which was discussed with the patient Continue home long-acting insulin - increased dose to 30 units daily   Sliding-scale novolog meal times   PAD (peripheral artery disease) (HCC) On Eliquis, aspirin, Plavix indefinitely per vascular surgery - holding Eliquis and Plavix pending I&D  Dyslipidemia Continue statin  Abnormal EKG Noted atrial fibrillation on EKG but substantial artifact, difficult to assess P waves, suspect actually NSR  Had stable ECHO 02/2022  Cardiology consult here if decompensates, vs outpatient f/u as clinically indicated.  Telemetry for now appears sinus rhythm     DVT prophylaxis: Eliquis  Pertinent IV fluids/nutrition: d/c continuous IV fluids  Central lines / invasive devices: none  Code Status: FULL CODE ACP documentation reviewed: 07/06/22 none on file [name]  Current Admission Status: inpatient   TOC needs / Dispo plan: anticipate [facility], possible SNF rehab, PT/OT to assess following procedure  Barriers to discharge / significant pending items: surgery 06/14  following Plavix/Eliquis washout               Subjective / Brief ROS:  Patient reports no concerns today Asks about brace for her foot  Denies CP/SOB.  Pain controlled.  Denies new weakness.  Tolerating diet.    Family Communication: none at this time     Objective Findings:  Vitals:   07/07/22 0817 07/07/22 1525 07/08/22 0028 07/08/22 0740  BP: 138/77 119/61 (!) 142/72 123/70  Pulse: 78 75 77 72  Resp: 17 17 16 16   Temp: 97.8 F (36.6 C) 97.7 F (36.5 C) 97.9 F (36.6 C) 99.2 F (37.3 C)  TempSrc:  Oral    SpO2: 98% 100% 100% 100%  Weight:      Height:        Intake/Output Summary (Last 24 hours) at 07/08/2022 1346 Last data filed at 07/08/2022 1013 Gross per 24 hour  Intake 533.03 ml  Output 550 ml  Net -16.97 ml   Filed Weights   07/05/22 1322  Weight: 53.5 kg    Examination:  Physical Exam Constitutional:      General: She is not in acute distress. Cardiovascular:     Rate and Rhythm: Normal rate and regular rhythm.  Pulmonary:     Effort: Pulmonary effort is normal.     Breath sounds: Normal breath sounds.  Musculoskeletal:        General: No tenderness.     Right lower leg: Edema present.     Left lower leg: Edema present.  Skin:    General: Skin is warm and dry.  Neurological:     General: No focal deficit present.     Mental Status: She is alert and oriented to person, place, and time.  Psychiatric:        Mood and Affect: Mood normal.        Behavior: Behavior normal.          Scheduled Medications:   amLODipine  10 mg Oral Daily   vitamin C  500 mg Oral BID   aspirin EC  81 mg Oral Daily   atorvastatin  80 mg Oral Daily   feeding supplement  1 Container Oral TID BM   furosemide  20 mg Oral Daily   insulin aspart  0-15 Units Subcutaneous TID WC   insulin aspart  0-5 Units Subcutaneous QHS   insulin aspart  4 Units Subcutaneous TID WC   insulin glargine-yfgn  30 Units Subcutaneous Daily   multivitamin with minerals   1 tablet Oral Daily   pantoprazole  40 mg Oral Daily   tiotropium  1 capsule Inhalation Daily   zinc sulfate  220 mg Oral Daily    Continuous Infusions:  ceFEPime (MAXIPIME) IV 2 g (07/08/22 9562)   metronidazole 500 mg (07/08/22 0502)   vancomycin 1,250 mg (07/08/22 1008)    PRN Medications:  acetaminophen **OR** acetaminophen, albuterol, diphenhydrAMINE, HYDROcodone-acetaminophen, morphine injection, ondansetron **OR** ondansetron (ZOFRAN) IV  Antimicrobials from admission:  Anti-infectives (From admission, onward)    Start     Dose/Rate Route Frequency Ordered Stop   07/06/22 1000  vancomycin (VANCOREADY) IVPB 1250 mg/250 mL        1,250 mg 166.7 mL/hr over 90 Minutes Intravenous Every 24 hours 07/05/22 1922     07/05/22 2200  ceFEPIme (  MAXIPIME) 2 g in sodium chloride 0.9 % 100 mL IVPB        2 g 200 mL/hr over 30 Minutes Intravenous Every 8 hours 07/05/22 1922     07/05/22 1745  metroNIDAZOLE (FLAGYL) IVPB 500 mg        500 mg 100 mL/hr over 60 Minutes Intravenous Every 12 hours 07/05/22 1731 07/12/22 1744   07/05/22 1615  vancomycin (VANCOCIN) IVPB 1000 mg/200 mL premix        1,000 mg 200 mL/hr over 60 Minutes Intravenous  Once 07/05/22 1609 07/05/22 1822   07/05/22 1615  piperacillin-tazobactam (ZOSYN) IVPB 3.375 g        3.375 g 100 mL/hr over 30 Minutes Intravenous  Once 07/05/22 1609 07/05/22 1721           Data Reviewed:  I have personally reviewed the following...  CBC: Recent Labs  Lab 07/05/22 1338 07/06/22 0346 07/07/22 0351 07/08/22 0357  WBC 5.6 5.5 4.5 4.5  NEUTROABS 4.2  --   --   --   HGB 9.3* 8.5* 7.6* 7.6*  HCT 32.7* 28.2* 26.2* 26.4*  MCV 80.5 77.7* 79.6* 79.5*  PLT 281 250 222 214   Basic Metabolic Panel: Recent Labs  Lab 07/05/22 1338 07/06/22 0346 07/07/22 0351 07/08/22 0357  NA 134* 136 136 136  K 3.8 3.4* 3.4* 3.5  CL 99 104 105 107  CO2 27 26 27 24   GLUCOSE 308* 341* 395* 321*  BUN 21* 16 16 24*  CREATININE 0.54  0.38* 0.47 0.53  CALCIUM 9.2 8.6* 8.1* 8.6*   GFR: Estimated Creatinine Clearance: 69.5 mL/min (by C-G formula based on SCr of 0.53 mg/dL). Liver Function Tests: Recent Labs  Lab 07/05/22 1338 07/06/22 0346  AST 21 13*  ALT 16 12  ALKPHOS 104 88  BILITOT 0.7 0.6  PROT 7.4 6.6  ALBUMIN 3.6 3.1*   No results for input(s): "LIPASE", "AMYLASE" in the last 168 hours. No results for input(s): "AMMONIA" in the last 168 hours. Coagulation Profile: No results for input(s): "INR", "PROTIME" in the last 168 hours. Cardiac Enzymes: No results for input(s): "CKTOTAL", "CKMB", "CKMBINDEX", "TROPONINI" in the last 168 hours. BNP (last 3 results) No results for input(s): "PROBNP" in the last 8760 hours. HbA1C: No results for input(s): "HGBA1C" in the last 72 hours. CBG: Recent Labs  Lab 07/07/22 1142 07/07/22 1616 07/07/22 2135 07/08/22 0741 07/08/22 1130  GLUCAP 255* 225* 197* 273* 92   Lipid Profile: No results for input(s): "CHOL", "HDL", "LDLCALC", "TRIG", "CHOLHDL", "LDLDIRECT" in the last 72 hours. Thyroid Function Tests: No results for input(s): "TSH", "T4TOTAL", "FREET4", "T3FREE", "THYROIDAB" in the last 72 hours. Anemia Panel: No results for input(s): "VITAMINB12", "FOLATE", "FERRITIN", "TIBC", "IRON", "RETICCTPCT" in the last 72 hours. Most Recent Urinalysis On File:     Component Value Date/Time   COLORURINE YELLOW (A) 06/01/2022 1800   APPEARANCEUR HAZY (A) 06/01/2022 1800   APPEARANCEUR Clear 08/30/2012 0047   LABSPEC 1.008 06/01/2022 1800   LABSPEC 1.014 08/30/2012 0047   PHURINE 7.0 06/01/2022 1800   GLUCOSEU >=500 (A) 06/01/2022 1800   GLUCOSEU >=500 08/30/2012 0047   HGBUR MODERATE (A) 06/01/2022 1800   BILIRUBINUR NEGATIVE 06/01/2022 1800   BILIRUBINUR Negative 08/30/2012 0047   KETONESUR NEGATIVE 06/01/2022 1800   PROTEINUR NEGATIVE 06/01/2022 1800   NITRITE NEGATIVE 06/01/2022 1800   LEUKOCYTESUR LARGE (A) 06/01/2022 1800   LEUKOCYTESUR 1+ 08/30/2012  0047   Sepsis Labs: @LABRCNTIP (procalcitonin:4,lacticidven:4) Microbiology: Recent Results (from the past 240 hour(s))  Blood culture (routine x 2)     Status: None (Preliminary result)   Collection Time: 07/05/22  2:04 PM   Specimen: BLOOD  Result Value Ref Range Status   Specimen Description BLOOD BLOOD RIGHT FOREARM  Final   Special Requests   Final    BOTTLES DRAWN AEROBIC AND ANAEROBIC Blood Culture adequate volume   Culture   Final    NO GROWTH 2 DAYS Performed at [facility], [address] [place], [place] [address]    Report Status PENDING  Incomplete  Blood culture (routine x 2)     Status: None (Preliminary result)   Collection Time: 07/05/22  2:04 PM   Specimen: BLOOD  Result Value Ref Range Status   Specimen Description BLOOD BLOOD RIGHT ARM  Final   Special Requests   Final    BOTTLES DRAWN AEROBIC AND ANAEROBIC Blood Culture results may not be optimal due to an excessive volume of blood received in culture bottles   Culture   Final    NO GROWTH 2 DAYS Performed at [facility], [address] [place], [place] [address]    Report Status PENDING  Incomplete  Aerobic/Anaerobic Culture w Gram Stain (surgical/deep wound)     Status: None (Preliminary result)   Collection Time: 07/05/22  3:40 PM   Specimen: Foot  Result Value Ref Range Status   Specimen Description   Final    FOOT Performed at [facility], [address] [place], [place] [address]    Special Requests   Final    RF Performed at [facility], [address] [place], [place] [address]    Gram Stain   Final    RARE WBC PRESENT, PREDOMINANTLY MONONUCLEAR RARE GRAM POSITIVE COCCI IN SINGLES    Culture   Final    MODERATE STAPHYLOCOCCUS AUREUS FEW PSEUDOMONAS AERUGINOSA SUSCEPTIBILITIES TO FOLLOW Performed at [facility], [address], [place], [place] [address]    Report Status PENDING  Incomplete      Radiology Studies last 3 days: MR  FOOT RIGHT W WO CONTRAST  Result Date: 07/06/2022 CLINICAL DATA:  Soft tissue infection suspected, foot, xray done rule out osteomyelitis EXAM: MRI OF THE RIGHT FOREFOOT WITHOUT AND WITH CONTRAST TECHNIQUE: Multiplanar, multisequence MR imaging of the right forefoot was performed before and after the administration of intravenous contrast. CONTRAST:  5mL GADAVIST GADOBUTROL 1 MMOL/ML IV SOLN COMPARISON:  X-ray 07/05/2022 FINDINGS: Bones/Joint/Cartilage Prior amputations of the great toe and second toe. Intense bone marrow edema and confluent low T1 signal changes within the residual first and second metatarsal heads and necks compatible with acute osteomyelitis. There is also osteomyelitis within the hallux sesamoids, more pronounced within the tibial hallux sesamoid. Patchy bone marrow edema throughout the remaining bones of the forefoot may be due to a combination of demineralization, stress related changes, and reactive osteitis. More serpiginous marrow signal alterations within the anterior calcaneus, navicular, and first metatarsal base could represent areas of bone infarction or developing nondisplaced fractures. Ligaments Intact Lisfranc ligament.  No acute collateral ligament injury. Muscles and Tendons Appearance of the foot musculature suggest a combination of denervation and myositis. Amputation changes of the flexor and extensor tendons of the first and second toes. No well-defined tenosynovial fluid collection. Soft tissues Marked diffuse soft tissue swelling of the foot. No organized or drainable fluid collections. Foci of susceptibility within the soft tissues at the distal amputation margins may be postsurgical or represent a small amount of  soft tissue gas. IMPRESSION: 1. Prior amputations of the great toe and second toe. Acute osteomyelitis of the residual first and second metatarsal heads and necks. 2. Osteomyelitis of the hallux sesamoids, more pronounced within the tibial hallux sesamoid. 3.  Patchy bone marrow edema throughout the remaining bones of the forefoot may be due to a combination of demineralization, stress-related changes, and reactive osteitis. More serpiginous marrow signal alterations within the anterior calcaneus, navicular, and first metatarsal base could represent areas of bone infarction or developing nondisplaced fractures. 4. Marked diffuse soft tissue swelling of the foot. No organized or drainable fluid collections. 5. Foci of susceptibility within the soft tissues at the distal amputation margins may be postsurgical or represent a small amount of soft tissue gas. Electronically Signed   By: Duanne Guess D.O.   On: 07/06/2022 10:08   DG Chest 2 View  Result Date: 07/05/2022 CLINICAL DATA:  Weakness and hypotension.  Possible sepsis. EXAM: CHEST - 2 VIEW COMPARISON:  Chest radiograph 06/01/2022 FINDINGS: The cardiac silhouette is upper limits of normal in size. Aortic atherosclerosis is noted. The lungs are hyperinflated with mild interstitial coarsening. No airspace consolidation, edema, pleural effusion, or pneumothorax is identified. Right upper quadrant abdominal surgical clips are noted. No acute osseous abnormality is seen. IMPRESSION: No active cardiopulmonary disease. Electronically Signed   By: Sebastian Ache M.D.   On: 07/05/2022 16:21   DG Foot Complete Right  Result Date: 07/05/2022 CLINICAL DATA:  Wound care.  Weakness and fatigue. EXAM: RIGHT FOOT COMPLETE - 3+ VIEW COMPARISON:  None Available. FINDINGS: The bones are osteopenic. There has been prior amputations of the first and second metatarsal heads and phalanges. There is some questionable minimal cortical erosions along the region of the second metatarsal neck. There is overlying soft tissue swelling. There is no acute fracture or dislocation identified. Plantar calcaneal spur is present. There is diffuse soft tissue swelling of the foot. IMPRESSION: 1. Prior amputations of the first and second  metatarsal heads and phalanges. 2. Questionable minimal cortical erosions along the region of the second metatarsal neck with overlying soft tissue swelling. Osteomyelitis cannot be excluded. 3. Diffuse soft tissue swelling. Electronically Signed   By: Darliss Cheney M.D.   On: 07/05/2022 15:54             LOS: 3 days        Sunnie Nielsen, DO [facility] 07/08/2022, 1:46 PM    Dictation software may have been used to generate the above note. Typos may occur and escape review in typed/dictated notes. Please contact Dr Lyn Hollingshead directly for clarity if needed.  Staff may message me via secure chat in Epic  but this may not receive an immediate response,  please page me for urgent matters!  If 7PM-7AM, please contact night coverage [email or web address]

## 2022-07-09 ENCOUNTER — Encounter
Admission: EM | Disposition: A | Discharge status: Home Health | Payer: Self-pay | Source: Home / Self Care | Attending: Osteopathic Medicine

## 2022-07-09 ENCOUNTER — Ambulatory Visit (INDEPENDENT_AMBULATORY_CARE_PROVIDER_SITE_OTHER): Payer: Medicaid Other | Admitting: Nurse Practitioner

## 2022-07-09 ENCOUNTER — Inpatient Hospital Stay: Payer: Medicaid Other | Admitting: Registered Nurse

## 2022-07-09 ENCOUNTER — Encounter (INDEPENDENT_AMBULATORY_CARE_PROVIDER_SITE_OTHER): Payer: Self-pay

## 2022-07-09 ENCOUNTER — Encounter: Payer: Self-pay | Admitting: Family Medicine

## 2022-07-09 ENCOUNTER — Other Ambulatory Visit: Payer: Self-pay

## 2022-07-09 DIAGNOSIS — L089 Local infection of the skin and subcutaneous tissue, unspecified: Secondary | ICD-10-CM | POA: Diagnosis not present

## 2022-07-09 DIAGNOSIS — E11628 Type 2 diabetes mellitus with other skin complications: Secondary | ICD-10-CM | POA: Diagnosis not present

## 2022-07-09 HISTORY — PX: INCISION AND DRAINAGE OF WOUND: SHX1803

## 2022-07-09 LAB — VANCOMYCIN, TROUGH: Vancomycin Tr: 6 ug/mL — ABNORMAL LOW (ref 15–20)

## 2022-07-09 LAB — CREATININE, SERUM
Creatinine, Ser: 0.51 mg/dL (ref 0.44–1.00)
GFR, Estimated: >60 mL/min (ref >60)

## 2022-07-09 LAB — GLUCOSE, CAPILLARY
Glucose-Capillary: 342 mg/dL — ABNORMAL HIGH (ref 70–99)
Glucose-Capillary: 238 mg/dL — ABNORMAL HIGH (ref 70–99)
Glucose-Capillary: 209 mg/dL — ABNORMAL HIGH (ref 70–99)
Glucose-Capillary: 207 mg/dL — ABNORMAL HIGH (ref 70–99)
Glucose-Capillary: 69 mg/dL — ABNORMAL LOW (ref 70–99)
Glucose-Capillary: 173 mg/dL — ABNORMAL HIGH (ref 70–99)

## 2022-07-09 LAB — HEMOGLOBIN AND HEMATOCRIT, BLOOD
HCT: 28.1 % — ABNORMAL LOW (ref 36.0–46.0)
Hemoglobin: 8.2 g/dL — ABNORMAL LOW (ref 12.0–15.0)

## 2022-07-09 LAB — AEROBIC/ANAEROBIC CULTURE W GRAM STAIN (SURGICAL/DEEP WOUND)

## 2022-07-09 LAB — CULTURE, BLOOD (ROUTINE X 2)

## 2022-07-09 SURGERY — IRRIGATION AND DEBRIDEMENT WOUND
Anesthesia: General | Site: Foot | Laterality: Right

## 2022-07-09 MED ORDER — SODIUM CHLORIDE 0.9 % IV SOLN: INTRAVENOUS | Status: DC | PRN

## 2022-07-09 MED ORDER — VANCOMYCIN HCL IN DEXTROSE 1-5 GM/200ML-% IV SOLN
1000.0000 mg | Freq: Two times a day (BID) | INTRAVENOUS | Status: DC
  Administered 2022-07-09 – 2022-07-14 (×10): 1000 mg via INTRAVENOUS
  Filled 2022-07-09 (×11): qty 200

## 2022-07-09 MED ORDER — MIDAZOLAM HCL 2 MG/2ML IJ SOLN
INTRAMUSCULAR | Status: DC | PRN
  Administered 2022-07-09: 2 mg via INTRAVENOUS

## 2022-07-09 MED ORDER — BUPIVACAINE HCL (PF) 0.5 % IJ SOLN
INTRAMUSCULAR | Status: DC | PRN
  Administered 2022-07-09: 20 mL

## 2022-07-09 MED ORDER — ONDANSETRON HCL 4 MG/2ML IJ SOLN
INTRAMUSCULAR | Status: DC | PRN
  Administered 2022-07-09: 4 mg via INTRAVENOUS

## 2022-07-09 MED ORDER — PROPOFOL 10 MG/ML IV BOLUS
INTRAVENOUS | Status: AC
  Filled 2022-07-09: qty 20

## 2022-07-09 MED ORDER — BUPIVACAINE HCL (PF) 0.5 % IJ SOLN
INTRAMUSCULAR | Status: AC
  Filled 2022-07-09: qty 30

## 2022-07-09 MED ORDER — LIDOCAINE HCL (CARDIAC) PF 100 MG/5ML IV SOSY
PREFILLED_SYRINGE | INTRAVENOUS | Status: DC | PRN
  Administered 2022-07-09: 60 mg via INTRATRACHEAL

## 2022-07-09 MED ORDER — FENTANYL CITRATE (PF) 100 MCG/2ML IJ SOLN
INTRAMUSCULAR | Status: AC
  Filled 2022-07-09: qty 2

## 2022-07-09 MED ORDER — OXYCODONE HCL 5 MG PO TABS
5.0000 mg | ORAL_TABLET | Freq: Once | ORAL | Status: AC | PRN
  Administered 2022-07-09: 5 mg via ORAL

## 2022-07-09 MED ORDER — PROPOFOL 10 MG/ML IV BOLUS
INTRAVENOUS | Status: DC | PRN
  Administered 2022-07-09: 30 mg via INTRAVENOUS

## 2022-07-09 MED ORDER — PROPOFOL 500 MG/50ML IV EMUL
INTRAVENOUS | Status: DC | PRN
  Administered 2022-07-09: 40 ug/kg/min via INTRAVENOUS

## 2022-07-09 MED ORDER — MIDAZOLAM HCL 2 MG/2ML IJ SOLN
INTRAMUSCULAR | Status: AC
  Filled 2022-07-09: qty 2

## 2022-07-09 MED ORDER — 0.9 % SODIUM CHLORIDE (POUR BTL) OPTIME
TOPICAL | Status: DC | PRN
  Administered 2022-07-09: 500 mL

## 2022-07-09 MED ORDER — INSULIN ASPART 100 UNIT/ML IJ SOLN
8.0000 [IU] | Freq: Three times a day (TID) | INTRAMUSCULAR | Status: DC
  Administered 2022-07-09 – 2022-07-14 (×14): 8 [IU] via SUBCUTANEOUS
  Filled 2022-07-09 (×14): qty 1

## 2022-07-09 MED ORDER — OXYCODONE HCL 5 MG/5ML PO SOLN: 5.0000 mg | Freq: Once | ORAL | Status: AC | PRN

## 2022-07-09 MED ORDER — OXYCODONE HCL 5 MG PO TABS
ORAL_TABLET | ORAL | Status: AC
  Filled 2022-07-09: qty 1

## 2022-07-09 MED ORDER — CIPROFLOXACIN IN D5W 400 MG/200ML IV SOLN
400.0000 mg | Freq: Two times a day (BID) | INTRAVENOUS | Status: DC
  Administered 2022-07-09 – 2022-07-13 (×10): 400 mg via INTRAVENOUS
  Filled 2022-07-09 (×11): qty 200

## 2022-07-09 MED ORDER — FENTANYL CITRATE (PF) 100 MCG/2ML IJ SOLN: 25.0000 ug | INTRAMUSCULAR | Status: DC | PRN

## 2022-07-09 MED ORDER — INSULIN GLARGINE-YFGN 100 UNIT/ML ~~LOC~~ SOLN
35.0000 [IU] | Freq: Every day | SUBCUTANEOUS | Status: DC
  Administered 2022-07-09 – 2022-07-14 (×6): 35 [IU] via SUBCUTANEOUS
  Filled 2022-07-09 (×7): qty 0.35

## 2022-07-09 SURGICAL SUPPLY — 52 items
BLADE MED AGGRESSIVE (BLADE) IMPLANT
BLADE SURG 15 STRL LF DISP TIS (BLADE) ×1 IMPLANT
BLADE SURG 15 STRL SS (BLADE) ×1
BLADE SURG MINI STRL (BLADE) IMPLANT
BNDG CMPR 75X21 PLY HI ABS (MISCELLANEOUS) ×1
BNDG CMPR STD VLCR NS LF 5.8X4 (GAUZE/BANDAGES/DRESSINGS) ×1
BNDG ELASTIC 4X5.8 VLCR NS LF (GAUZE/BANDAGES/DRESSINGS) ×1 IMPLANT
BNDG ESMARCH 4 X 12 STRL LF (GAUZE/BANDAGES/DRESSINGS) ×1
BNDG ESMARCH 4X12 STRL LF (GAUZE/BANDAGES/DRESSINGS) ×1 IMPLANT
BNDG GAUZE DERMACEA FLUFF 4 (GAUZE/BANDAGES/DRESSINGS) ×1 IMPLANT
BNDG GZE DERMACEA 4 6PLY (GAUZE/BANDAGES/DRESSINGS) ×1
CUFF TOURN SGL QUICK 12 (TOURNIQUET CUFF) IMPLANT
CUFF TOURN SGL QUICK 18X4 (TOURNIQUET CUFF) IMPLANT
DRAPE FLUOR MINI C-ARM 54X84 (DRAPES) IMPLANT
DURAPREP 26ML APPLICATOR (WOUND CARE) ×1 IMPLANT
ELECT REM PT RETURN 9FT ADLT (ELECTROSURGICAL) ×1
ELECTRODE REM PT RTRN 9FT ADLT (ELECTROSURGICAL) ×1 IMPLANT
GAUZE SPONGE 4X4 12PLY STRL (GAUZE/BANDAGES/DRESSINGS) ×1 IMPLANT
GAUZE STRETCH 2X75IN STRL (MISCELLANEOUS) ×1 IMPLANT
GAUZE XEROFORM 1X8 LF (GAUZE/BANDAGES/DRESSINGS) ×1 IMPLANT
GLOVE BIO SURGEON STRL SZ7.5 (GLOVE) ×1 IMPLANT
GLOVE INDICATOR 8.0 STRL GRN (GLOVE) ×1 IMPLANT
GOWN STRL REUS W/ TWL LRG LVL3 (GOWN DISPOSABLE) ×2 IMPLANT
GOWN STRL REUS W/TWL LRG LVL3 (GOWN DISPOSABLE) ×2
HANDPIECE VERSAJET DEBRIDEMENT (MISCELLANEOUS) ×1 IMPLANT
KIT STIMULAN RAPID CURE 5CC (Orthopedic Implant) IMPLANT
KIT TURNOVER KIT A (KITS) ×1 IMPLANT
LABEL OR SOLS (LABEL) ×1 IMPLANT
MANIFOLD NEPTUNE II (INSTRUMENTS) ×1 IMPLANT
NDL FILTER BLUNT 18X1 1/2 (NEEDLE) ×1 IMPLANT
NDL HYPO 25X1 1.5 SAFETY (NEEDLE) ×3 IMPLANT
NEEDLE FILTER BLUNT 18X1 1/2 (NEEDLE) ×1 IMPLANT
NEEDLE HYPO 25X1 1.5 SAFETY (NEEDLE) ×3 IMPLANT
NS IRRIG 500ML POUR BTL (IV SOLUTION) ×1 IMPLANT
PACK EXTREMITY ARMC (MISCELLANEOUS) ×1 IMPLANT
PAD ABD DERMACEA PRESS 5X9 (GAUZE/BANDAGES/DRESSINGS) ×2 IMPLANT
RASP SM TEAR CROSS CUT (RASP) IMPLANT
SOL PREP PVP 2OZ (MISCELLANEOUS) ×1
SOLUTION PREP PVP 2OZ (MISCELLANEOUS) ×1 IMPLANT
STOCKINETTE STRL 6IN 960660 (GAUZE/BANDAGES/DRESSINGS) ×1 IMPLANT
SUT ETHILON 3-0 FS-10 30 BLK (SUTURE) ×1
SUT ETHILON 4-0 (SUTURE) ×1
SUT ETHILON 4-0 FS2 18XMFL BLK (SUTURE) ×1
SUT VIC AB 3-0 SH 27 (SUTURE) ×1
SUT VIC AB 3-0 SH 27X BRD (SUTURE) ×1 IMPLANT
SUT VIC AB 4-0 FS2 27 (SUTURE) ×1 IMPLANT
SUTURE EHLN 3-0 FS-10 30 BLK (SUTURE) ×1 IMPLANT
SUTURE ETHLN 4-0 FS2 18XMF BLK (SUTURE) ×1 IMPLANT
SYR 10ML LL (SYRINGE) ×2 IMPLANT
SYR 3ML LL SCALE MARK (SYRINGE) ×1 IMPLANT
TRAP FLUID SMOKE EVACUATOR (MISCELLANEOUS) ×1 IMPLANT
WATER STERILE IRR 500ML POUR (IV SOLUTION) ×1 IMPLANT

## 2022-07-09 NOTE — Progress Notes (Signed)
PROGRESS NOTE    Beth Carroll   ZOX:[id number] DOB: 06-03-1970  DOA: 07/05/2022 Date of Service: 07/09/22 PCP: Gracelyn Nurse, MD     Brief Narrative / Hospital Course:  Beth Carroll is a 52 y.o. female with medical history significant of multiple medical issues including peripheral artery disease, hyperglycemia with type 2 diabetes, GERD, HFpEF, coronary artery disease presenting to ED 07/05/2022 with right diabetic foot infection. Recent 1st and 2nd R toe amputations 06/03/2022 w/ Dr Wyn Quaker. Seen in follow up 05/31 in vascular office, some drainage and bleeding / serous discharge, advised elevation of limb and also Rx abx. Seen at wound care 06/03 and 06/10. On 06/10, pt reporting weakness/fatigue and noted to have low BP in wound clinic. was sent to ED.  06/10: Possible osteomyelitis. Admitted to hospitalist service.  06/11: Vascular recs for podiatry eval, podiatry plans for I&D but need to be off Plavix and Eliquis so plan for 06/14.  06/12: BCx NGx2d. Wound Cx (+)GPC 06/13: Wound Cx mod staph aureus and few pseudomonas, pend suscept 06/14: abx to cipro. I&D today.   Consultants:  Vascular surgery  Podiatry   Procedures: [facility] 07/09/2022 I&D LLE w/ Podiatry at 16:00]      ASSESSMENT & PLAN:   Principal Problem:   Diabetic foot infection (HCC) Active Problems:   Hyperglycemia due to type 2 diabetes mellitus (HCC)   Chronic systolic CHF (congestive heart failure) (HCC)   Essential hypertension   PAD (peripheral artery disease) (HCC)   CAD S/P percutaneous coronary angioplasty   GERD without esophagitis   Dyslipidemia   COPD (chronic obstructive pulmonary disease) (HCC)   Atrial fibrillation (HCC)   Malnutrition of moderate degree   Diabetic foot infection (HCC) Cx(+) mod staph aureus and few pseudomonas 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 Wyn Quaker Abx transitioned to po cipro Blood cultures NGx2d I&D w/ podiatry, following  plavix/Eliquis washout - planned for 06/14 Vascular surgery and podiatry teams following   Chronic systolic CHF (congestive heart failure) (HCC) 2D echo February 2024 with EF of 50 to 55% Appears euvolemic Monitor volume status closely  Anemia Monitor CBC Transfuse as needed / Hgb <7 or Hgb <8 w/ complications - pt has verbally consented to transfusion if needed   CAD S/P percutaneous coronary angioplasty No active chest pain Continue home ASA, atorvastatin   COPD (chronic obstructive pulmonary disease) (HCC) Stable, no acute exacerbation  Continue home inhalers  Essential hypertension BP stable Titrate home regimen as needed  Added amlodipine for now, may consider ACE/ARB +/- diuretic on discharge   GERD without esophagitis PPI  Hyperglycemia due to type 2 diabetes mellitus (HCC) Blood sugars in 300s on presentation Major contributor to wound complications which was discussed with the patient Continue home long-acting insulin - increased dose to 35 units daily   Sliding-scale novolog meal times   PAD (peripheral artery disease) (HCC) On Eliquis, aspirin, Plavix indefinitely per vascular surgery - holding Eliquis and Plavix pending I&D  Dyslipidemia Continue statin  Abnormal EKG Noted atrial fibrillation on EKG but substantial artifact, difficult to assess P waves, suspect actually NSR  Had stable ECHO 02/2022  Cardiology consult here if decompensates, vs outpatient f/u as clinically indicated.  Telemetry for now appears sinus rhythm     DVT prophylaxis: Eliquis  Pertinent IV fluids/nutrition: d/c continuous IV fluids  Central lines / invasive devices: none  Code Status: FULL CODE ACP documentation reviewed: 07/06/22 none on file [name]   Current  Admission Status: inpatient   TOC needs / Dispo plan: anticipate [facility], possible SNF rehab, PT/OT to assess following procedure  Barriers to discharge / significant pending items: surgery 06/14 following Plavix/Eliquis  washout               Subjective / Brief ROS:  Patient resting comfortably, awaiting surgery today   Family Communication: none at this time     Objective Findings:  Vitals:   07/08/22 1442 07/08/22 1447 07/09/22 0016 07/09/22 0920  BP: 131/66 96/72 (!) 144/74 137/69  Pulse: 79 74 85 78  Resp: 17 16 18 18   Temp: 98.2 F (36.8 C) 98.4 F (36.9 C) 98.4 F (36.9 C) 98.3 F (36.8 C)  TempSrc:      SpO2: 100% 98% 100% 99%  Weight:      Height:        Intake/Output Summary (Last 24 hours) at 07/09/2022 1248 Last data filed at 07/09/2022 1220 Gross per 24 hour  Intake --  Output 3400 ml  Net -3400 ml    Filed Weights   07/05/22 1322  Weight: 53.5 kg    Examination:  Physical Exam Constitutional:      General: She is not in acute distress. Cardiovascular:     Rate and Rhythm: Normal rate and regular rhythm.  Pulmonary:     Effort: Pulmonary effort is normal.     Breath sounds: Normal breath sounds.  Musculoskeletal:        General: Tenderness present.          Scheduled Medications:   amLODipine  10 mg Oral Daily   vitamin C  500 mg Oral BID   aspirin EC  81 mg Oral Daily   atorvastatin  80 mg Oral Daily   feeding supplement  1 Container Oral TID BM   furosemide  20 mg Oral Daily   insulin aspart  0-15 Units Subcutaneous TID WC   insulin aspart  0-5 Units Subcutaneous QHS   insulin aspart  8 Units Subcutaneous TID WC   insulin glargine-yfgn  35 Units Subcutaneous Daily   multivitamin with minerals  1 tablet Oral Daily   pantoprazole  40 mg Oral Daily   tiotropium  1 capsule Inhalation Daily   zinc sulfate  220 mg Oral Daily    Continuous Infusions:  ciprofloxacin 400 mg (07/09/22 1140)   metronidazole 500 mg (07/09/22 0459)   vancomycin      PRN Medications:  acetaminophen **OR** acetaminophen, albuterol, diphenhydrAMINE, HYDROcodone-acetaminophen, morphine injection, ondansetron **OR** ondansetron (ZOFRAN) IV  Antimicrobials from  admission:  Anti-infectives (From admission, onward)    Start     Dose/Rate Route Frequency Ordered Stop   07/09/22 1200  vancomycin (VANCOCIN) IVPB 1000 mg/200 mL premix        1,000 mg 200 mL/hr over 60 Minutes Intravenous Every 12 hours 07/09/22 1049     07/09/22 1000  ciprofloxacin (CIPRO) IVPB 400 mg        400 mg 200 mL/hr over 60 Minutes Intravenous Every 12 hours 07/09/22 0903     07/06/22 1000  vancomycin (VANCOREADY) IVPB 1250 mg/250 mL  Status:  Discontinued        1,250 mg 166.7 mL/hr over 90 Minutes Intravenous Every 24 hours 07/05/22 1922 07/09/22 1048   07/05/22 2200  ceFEPIme (MAXIPIME) 2 g in sodium chloride 0.9 % 100 mL IVPB  Status:  Discontinued        2 g 200 mL/hr over 30 Minutes Intravenous Every 8 hours 07/05/22  1922 07/09/22 0902   07/05/22 1745  metroNIDAZOLE (FLAGYL) IVPB 500 mg        500 mg 100 mL/hr over 60 Minutes Intravenous Every 12 hours 07/05/22 1731 07/12/22 1744   07/05/22 1615  vancomycin (VANCOCIN) IVPB 1000 mg/200 mL premix        1,000 mg 200 mL/hr over 60 Minutes Intravenous  Once 07/05/22 1609 07/05/22 1822   07/05/22 1615  piperacillin-tazobactam (ZOSYN) IVPB 3.375 g        3.375 g 100 mL/hr over 30 Minutes Intravenous  Once 07/05/22 1609 07/05/22 1721           Data Reviewed:  I have personally reviewed the following...  CBC: Recent Labs  Lab 07/05/22 1338 07/06/22 0346 07/07/22 0351 07/08/22 0357 07/09/22 0317  WBC 5.6 5.5 4.5 4.5  --   NEUTROABS 4.2  --   --   --   --   HGB 9.3* 8.5* 7.6* 7.6* 8.2*  HCT 32.7* 28.2* 26.2* 26.4* 28.1*  MCV 80.5 77.7* 79.6* 79.5*  --   PLT 281 250 222 214  --     Basic Metabolic Panel: Recent Labs  Lab 07/05/22 1338 07/06/22 0346 07/07/22 0351 07/08/22 0357 07/09/22 0317  NA 134* 136 136 136  --   K 3.8 3.4* 3.4* 3.5  --   CL 99 104 105 107  --   CO2 27 26 27 24   --   GLUCOSE 308* 341* 395* 321*  --   BUN 21* 16 16 24*  --   CREATININE 0.54 0.38* 0.47 0.53 0.51  CALCIUM 9.2  8.6* 8.1* 8.6*  --     GFR: Estimated Creatinine Clearance: 69.5 mL/min (by C-G formula based on SCr of 0.51 mg/dL). Liver Function Tests: Recent Labs  Lab 07/05/22 1338 07/06/22 0346  AST 21 13*  ALT 16 12  ALKPHOS 104 88  BILITOT 0.7 0.6  PROT 7.4 6.6  ALBUMIN 3.6 3.1*    No results for input(s): "LIPASE", "AMYLASE" in the last 168 hours. No results for input(s): "AMMONIA" in the last 168 hours. Coagulation Profile: No results for input(s): "INR", "PROTIME" in the last 168 hours. Cardiac Enzymes: No results for input(s): "CKTOTAL", "CKMB", "CKMBINDEX", "TROPONINI" in the last 168 hours. BNP (last 3 results) No results for input(s): "PROBNP" in the last 8760 hours. HbA1C: No results for input(s): "HGBA1C" in the last 72 hours. CBG: Recent Labs  Lab 07/08/22 1130 07/08/22 1638 07/08/22 2203 07/09/22 0756 07/09/22 1142  GLUCAP 92 262* 325* 342* 238*    Lipid Profile: No results for input(s): "CHOL", "HDL", "LDLCALC", "TRIG", "CHOLHDL", "LDLDIRECT" in the last 72 hours. Thyroid Function Tests: No results for input(s): "TSH", "T4TOTAL", "FREET4", "T3FREE", "THYROIDAB" in the last 72 hours. Anemia Panel: No results for input(s): "VITAMINB12", "FOLATE", "FERRITIN", "TIBC", "IRON", "RETICCTPCT" in the last 72 hours. Most Recent Urinalysis On File:     Component Value Date/Time   COLORURINE YELLOW (A) 06/01/2022 1800   APPEARANCEUR HAZY (A) 06/01/2022 1800   APPEARANCEUR Clear 08/30/2012 0047   LABSPEC 1.008 06/01/2022 1800   LABSPEC 1.014 08/30/2012 0047   PHURINE 7.0 06/01/2022 1800   GLUCOSEU >=500 (A) 06/01/2022 1800   GLUCOSEU >=500 08/30/2012 0047   HGBUR MODERATE (A) 06/01/2022 1800   BILIRUBINUR NEGATIVE 06/01/2022 1800   BILIRUBINUR Negative 08/30/2012 0047   KETONESUR NEGATIVE 06/01/2022 1800   PROTEINUR NEGATIVE 06/01/2022 1800   NITRITE NEGATIVE 06/01/2022 1800   LEUKOCYTESUR LARGE (A) 06/01/2022 1800   LEUKOCYTESUR 1+ 08/30/2012 0047  Sepsis  Labs: @LABRCNTIP (procalcitonin:4,lacticidven:4) Microbiology: Recent Results (from the past 240 hour(s))  Blood culture (routine x 2)     Status: None (Preliminary result)   Collection Time: 07/05/22  2:04 PM   Specimen: BLOOD  Result Value Ref Range Status   Specimen Description BLOOD BLOOD RIGHT FOREARM  Final   Special Requests   Final    BOTTLES DRAWN AEROBIC AND ANAEROBIC Blood Culture adequate volume   Culture   Final    NO GROWTH 4 DAYS Performed at [facility], [address] [place], [place] [address]    Report Status PENDING  Incomplete  Blood culture (routine x 2)     Status: None (Preliminary result)   Collection Time: 07/05/22  2:04 PM   Specimen: BLOOD  Result Value Ref Range Status   Specimen Description BLOOD BLOOD RIGHT ARM  Final   Special Requests   Final    BOTTLES DRAWN AEROBIC AND ANAEROBIC Blood Culture results may not be optimal due to an excessive volume of blood received in culture bottles   Culture   Final    NO GROWTH 4 DAYS Performed at [facility], [address] [place], [place] [address]    Report Status PENDING  Incomplete  Aerobic/Anaerobic Culture w Gram Stain (surgical/deep wound)     Status: None (Preliminary result)   Collection Time: 07/05/22  3:40 PM   Specimen: Foot  Result Value Ref Range Status   Specimen Description   Final    FOOT Performed at [facility], [address], [place], [place] [address]    Special Requests   Final    RF Performed at [facility], [address] [place], [place] [address]    Gram Stain   Final    RARE WBC PRESENT, PREDOMINANTLY MONONUCLEAR RARE GRAM POSITIVE COCCI IN SINGLES Performed at [facility], [address], [place], [place] [address]    Culture   Final    MODERATE METHICILLIN RESISTANT STAPHYLOCOCCUS AUREUS FEW PSEUDOMONAS AERUGINOSA    Report Status PENDING  Incomplete   Organism ID, Bacteria METHICILLIN RESISTANT STAPHYLOCOCCUS  AUREUS  Final   Organism ID, Bacteria PSEUDOMONAS AERUGINOSA  Final      Susceptibility   Methicillin resistant staphylococcus aureus - MIC*    CIPROFLOXACIN >=8 RESISTANT Resistant     ERYTHROMYCIN >=8 RESISTANT Resistant     GENTAMICIN <=0.5 SENSITIVE Sensitive     OXACILLIN >=4 RESISTANT Resistant     TETRACYCLINE <=1 SENSITIVE Sensitive     VANCOMYCIN <=0.5 SENSITIVE Sensitive     TRIMETH/SULFA >=320 RESISTANT Resistant     CLINDAMYCIN <=0.25 SENSITIVE Sensitive     RIFAMPIN <=0.5 SENSITIVE Sensitive     Inducible Clindamycin NEGATIVE Sensitive     LINEZOLID 2 SENSITIVE Sensitive     * MODERATE METHICILLIN RESISTANT STAPHYLOCOCCUS AUREUS   Pseudomonas aeruginosa - MIC*    CEFTAZIDIME >=64 RESISTANT Resistant     CIPROFLOXACIN <=0.25 SENSITIVE Sensitive     GENTAMICIN <=1 SENSITIVE Sensitive     IMIPENEM 1 SENSITIVE Sensitive     PIP/TAZO >=128 RESISTANT Resistant     * FEW PSEUDOMONAS AERUGINOSA      Radiology Studies last 3 days: MR FOOT RIGHT W WO CONTRAST  Result Date: 07/06/2022 CLINICAL DATA:  Soft tissue infection suspected, foot, xray done rule out osteomyelitis EXAM: MRI OF THE RIGHT FOREFOOT WITHOUT AND WITH CONTRAST TECHNIQUE: Multiplanar, multisequence MR imaging of the right forefoot was performed before and after the administration  of intravenous contrast. CONTRAST:  5mL GADAVIST GADOBUTROL 1 MMOL/ML IV SOLN COMPARISON:  X-ray 07/05/2022 FINDINGS: Bones/Joint/Cartilage Prior amputations of the great toe and second toe. Intense bone marrow edema and confluent low T1 signal changes within the residual first and second metatarsal heads and necks compatible with acute osteomyelitis. There is also osteomyelitis within the hallux sesamoids, more pronounced within the tibial hallux sesamoid. Patchy bone marrow edema throughout the remaining bones of the forefoot may be due to a combination of demineralization, stress related changes, and reactive osteitis. More serpiginous  marrow signal alterations within the anterior calcaneus, navicular, and first metatarsal base could represent areas of bone infarction or developing nondisplaced fractures. Ligaments Intact Lisfranc ligament.  No acute collateral ligament injury. Muscles and Tendons Appearance of the foot musculature suggest a combination of denervation and myositis. Amputation changes of the flexor and extensor tendons of the first and second toes. No well-defined tenosynovial fluid collection. Soft tissues Marked diffuse soft tissue swelling of the foot. No organized or drainable fluid collections. Foci of susceptibility within the soft tissues at the distal amputation margins may be postsurgical or represent a small amount of soft tissue gas. IMPRESSION: 1. Prior amputations of the great toe and second toe. Acute osteomyelitis of the residual first and second metatarsal heads and necks. 2. Osteomyelitis of the hallux sesamoids, more pronounced within the tibial hallux sesamoid. 3. Patchy bone marrow edema throughout the remaining bones of the forefoot may be due to a combination of demineralization, stress-related changes, and reactive osteitis. More serpiginous marrow signal alterations within the anterior calcaneus, navicular, and first metatarsal base could represent areas of bone infarction or developing nondisplaced fractures. 4. Marked diffuse soft tissue swelling of the foot. No organized or drainable fluid collections. 5. Foci of susceptibility within the soft tissues at the distal amputation margins may be postsurgical or represent a small amount of soft tissue gas. Electronically Signed   By: Duanne Guess D.O.   On: 07/06/2022 10:08   DG Chest 2 View  Result Date: 07/05/2022 CLINICAL DATA:  Weakness and hypotension.  Possible sepsis. EXAM: CHEST - 2 VIEW COMPARISON:  Chest radiograph 06/01/2022 FINDINGS: The cardiac silhouette is upper limits of normal in size. Aortic atherosclerosis is noted. The lungs are  hyperinflated with mild interstitial coarsening. No airspace consolidation, edema, pleural effusion, or pneumothorax is identified. Right upper quadrant abdominal surgical clips are noted. No acute osseous abnormality is seen. IMPRESSION: No active cardiopulmonary disease. Electronically Signed   By: Sebastian Ache M.D.   On: 07/05/2022 16:21   DG Foot Complete Right  Result Date: 07/05/2022 CLINICAL DATA:  Wound care.  Weakness and fatigue. EXAM: RIGHT FOOT COMPLETE - 3+ VIEW COMPARISON:  None Available. FINDINGS: The bones are osteopenic. There has been prior amputations of the first and second metatarsal heads and phalanges. There is some questionable minimal cortical erosions along the region of the second metatarsal neck. There is overlying soft tissue swelling. There is no acute fracture or dislocation identified. Plantar calcaneal spur is present. There is diffuse soft tissue swelling of the foot. IMPRESSION: 1. Prior amputations of the first and second metatarsal heads and phalanges. 2. Questionable minimal cortical erosions along the region of the second metatarsal neck with overlying soft tissue swelling. Osteomyelitis cannot be excluded. 3. Diffuse soft tissue swelling. Electronically Signed   By: Darliss Cheney M.D.   On: 07/05/2022 15:54             LOS: 4 days  Sunnie Nielsen, DO [facility] 07/09/2022, 12:48 PM    Dictation software may have been used to generate the above note. Typos may occur and escape review in typed/dictated notes. Please contact Dr Lyn Hollingshead directly for clarity if needed.  Staff may message me via secure chat in Epic  but this may not receive an immediate response,  please page me for urgent matters!  If 7PM-7AM, please contact night coverage [email or web address]

## 2022-07-09 NOTE — Interval H&P Note (Signed)
History and Physical Interval Note:  07/09/2022 3:51 PM  Beth Carroll  has presented today for surgery, with the diagnosis of Right foot infection.  The various methods of treatment have been discussed with the patient and family. After consideration of risks, benefits and other options for treatment, the patient has consented to  Procedure(s): IRRIGATION AND DEBRIDEMENT WOUND (Right) as a surgical intervention.  The patient's history has been reviewed, patient examined, no change in status, stable for surgery.  I have reviewed the patient's chart and labs.  Questions were answered to the patient's satisfaction.     Ricci Barker

## 2022-07-09 NOTE — Op Note (Signed)
Date of operation: 07/09/2022.  Surgeon: Ricci Barker D.P.M.  Preoperative diagnosis: Osteomyelitis right foot.  Postoperative diagnosis: Same.  Procedure: I&D infected soft tissue and bone right forefoot.  Anesthesia: Local MAC.  Hemostasis: Pneumatic tourniquet right ankle 250 mmHg.  Estimated blood loss: Less than 5 cc.  Cultures: Bone culture right first and second metatarsal head.  Pathology: 1.  Right first metatarsal and sesamoids. 2.  Right second metatarsal.  Implants: Stimulan rapid cure antibiotic beads impregnated with vancomycin.  Complications: None apparent.  Operative indications: This is a 52 year old female with history of peripheral vascular disease with recent intervention.  Recently had amputation of the first and second toes last month.  Wound dehisced with some nonhealing and continued infection.  Presented to the emergency department and diagnosed with osteomyelitis and admitted for IV antibiotics and debridement.  Operative procedure: Patient was taken to the operating room and placed on the table in the supine position.  Following satisfactory sedation the right forefoot was anesthetized with 20 cc of 0.5% Marcaine plain around the first and second metatarsals and forefoot.  Pneumatic tourniquet applied to the level of the right ankle and the foot was prepped and draped in the usual sterile fashion.  The foot was exsanguinated with an Esmarch and the tourniquet inflated to 250 mmHg.  Attention was directed to the previous incision area on the right foot where the sutures were removed and the original incision was reopened using a 15 blade.  Previous incision was then extended with a distal curvilinear portion and then along the medial first metatarsal.  Incision carried sharply down to the level of bone.  Soft tissues reflected off of the first and second metatarsals which were then transected in the midshaft area using a sagittal saw and the bone portions removed  in toto.  Sesamoids were also freed up from the surrounding tissue and resected from the first metatarsal phalangeal joint area.  Portion of bone from the first and second metatarsals was then taken for culture and the metatarsals and sesamoids were passed off with the proximal margin inked purple.  Some devitalized tissue was noted within the wound and this was thoroughly debrided using a Versajet debrider on a setting of 4.  The wound was then flushed with copious amounts of sterile saline and Stimulan rapid cure antibiotic beads then placed into the first and second metatarsal shafts and into the wound.  The incision was then closed using figure-of-eight sutures along the lateral aspect followed by simple interrupted sutures for the remainder of the incision.  Xeroform 4 x 4's and ABDs applied to the right foot followed by Kerlix wrap.  Tourniquet was released and a second Kerlix and Ace wrap then applied for compression.  Patient was awakened and transported to the PACU having tolerated the anesthesia and procedure well.

## 2022-07-09 NOTE — Plan of Care (Signed)
  Problem: Education: Goal: Knowledge of General Education information will improve Description: Including pain rating scale, medication(s)/side effects and non-pharmacologic comfort measures Outcome: Progressing   Problem: Health Behavior/Discharge Planning: Goal: Ability to manage health-related needs will improve Outcome: Progressing   Problem: Clinical Measurements: Goal: Diagnostic test results will improve Outcome: Progressing   Problem: Nutrition: Goal: Adequate nutrition will be maintained Outcome: Progressing   Problem: Elimination: Goal: Will not experience complications related to bowel motility Outcome: Progressing   Problem: Pain Managment: Goal: General experience of comfort will improve Outcome: Progressing

## 2022-07-09 NOTE — Progress Notes (Signed)
OT Cancellation Note  Patient Details Name: Beth Carroll MRN: [id number] DOB: March 05, 1970   Cancelled Treatment:    Reason Eval/Treat Not Completed: Other (comment). Chart reviewed. Pt pending surgery this date, will hold full OOB assessment until procedure completed and Wbing orders clarified.   Kathie Dike, M.S. OTR/L  07/09/22, 10:06 AM  ascom [phone]

## 2022-07-09 NOTE — Anesthesia Postprocedure Evaluation (Signed)
Anesthesia Post Note  Patient: Beth Carroll  Procedure(s) Performed: IRRIGATION AND DEBRIDEMENT WOUND (Right: Foot)  Patient location during evaluation: PACU Anesthesia Type: General Level of consciousness: awake and alert Pain management: pain level controlled Vital Signs Assessment: post-procedure vital signs reviewed and stable Respiratory status: spontaneous breathing, nonlabored ventilation, respiratory function stable and patient connected to nasal cannula oxygen Cardiovascular status: blood pressure returned to baseline and stable Postop Assessment: no apparent nausea or vomiting Anesthetic complications: no   No notable events documented.   Last Vitals:  Vitals:   07/09/22 1740 07/09/22 1811  BP: 107/61 126/68  Pulse: 63 72  Resp: 12 18  Temp: (!) 36.2 C 36.6 C  SpO2: 100% 100%    Last Pain:  Vitals:   07/09/22 1740  TempSrc:   PainSc: 0-No pain                 Louie Boston

## 2022-07-09 NOTE — Transfer of Care (Signed)
Immediate Anesthesia Transfer of Care Note  Patient: Beth Carroll  Procedure(s) Performed: IRRIGATION AND DEBRIDEMENT WOUND (Right)  Patient Location: PACU  Anesthesia Type:General  Level of Consciousness: awake, alert , and oriented  Airway & Oxygen Therapy: Patient Spontanous Breathing  Post-op Assessment: Report given to RN and Post -op Vital signs reviewed and stable  Post vital signs: Reviewed and stable  Last Vitals:  Vitals Value Taken Time  BP 113/74 07/09/22 1712  Temp    Pulse 73 07/09/22 1714  Resp 15 07/09/22 1714  SpO2 98 % 07/09/22 1714  Vitals shown include unvalidated device data.  Last Pain:  Vitals:   07/09/22 1533  TempSrc: Temporal  PainSc: 0-No pain      Patients Stated Pain Goal: 0 (07/09/22 1533)  Complications: No notable events documented.

## 2022-07-09 NOTE — Progress Notes (Signed)
PT Cancellation Note  Patient Details Name: Jaylyn Byrnes MRN: [id number] DOB: [birth year]   Cancelled Treatment:    Reason Eval/Treat Not Completed: Patient not medically ready.  PT consult received.  Chart reviewed.  Pt pending surgery today; will hold full OOB assessment/evaluation until after procedure completed and WB'ing orders are clarified.  Hendricks Limes, PT 07/09/22, 1:12 PM

## 2022-07-09 NOTE — Anesthesia Procedure Notes (Signed)
Date/Time: 07/09/2022 4:20 PM  Performed by: Stormy Fabian, CRNAPre-anesthesia Checklist: Patient identified, Emergency Drugs available, Suction available and Patient being monitored Patient Re-evaluated:Patient Re-evaluated prior to induction Oxygen Delivery Method: Simple face mask Induction Type: IV induction Dental Injury: Teeth and Oropharynx as per pre-operative assessment

## 2022-07-09 NOTE — Anesthesia Preprocedure Evaluation (Addendum)
Anesthesia Evaluation  Patient identified by MRN, date of birth, ID band Patient awake    Reviewed: Allergy & Precautions, NPO status , Patient's Chart, lab work & pertinent test results  History of Anesthesia Complications Negative for: history of anesthetic complications  Airway Mallampati: I  TM Distance: <3 FB Neck ROM: Full    Dental  (+) Upper Dentures, Lower Dentures   Pulmonary COPD, former smoker   Pulmonary exam normal        Cardiovascular hypertension, + CAD (s/p stent on Eliquis), + Past MI, + Peripheral Vascular Disease and +CHF (ICM, EF 40-45%)  Normal cardiovascular exam     Neuro/Psych  PSYCHIATRIC DISORDERS      negative neurological ROS     GI/Hepatic ,GERD  ,,  Endo/Other  diabetes, Type 2, Insulin Dependent    Renal/GU Renal disease     Musculoskeletal   Abdominal   Peds  Hematology negative hematology ROS (+)   Anesthesia Other Findings Past Medical History: No date: Acute deep vein thrombosis (DVT) of right lower extremity  (HCC) No date: ADHD (attention deficit hyperactivity disorder) 04/30/2022: AKI (acute kidney injury) (HCC) No date: Aortic atherosclerosis (HCC) No date: COPD (chronic obstructive pulmonary disease) (HCC) 12/15/2020: Coronary artery disease     Comment:  a.) LHC/PCI 12/15/2020: 99% mLAD (2.25 x 18 mm Onyx               Frontier DES), 90% RI (2.75 x 22 mm Onyx Frontier DES),               40% m-dLAD No date: Diabetes mellitus without complication (HCC) No date: Diabetes mellitus, type 2 (HCC) No date: GERD (gastroesophageal reflux disease) No date: HFrEF (heart failure with reduced ejection fraction) (HCC)     Comment:  a.) TTE 12/10/2020: EF 40-45%, apical/periapical HK,               G2DD; b.) LHC 12/15/2020: EF 35-45%; c.) TTE 06/26/2021:               EF 40-45%, LVH, G2DD; d.) TTE 03/21/2022: EF 55-60%,               LV/apical sep/ant wall HK, Triv MR, AoV  sclerosis 04/30/2022: High anion gap metabolic acidosis No date: Hyperlipemia No date: Hypertension 04/30/2022: Hypophosphatemia No date: Ischemic cardiomyopathy     Comment:  a.) TTE 12/10/2020: EF 40-45%; b.) LHC 12/15/2020: EF               35-45%; c.) TTE 06/26/2021: EF 40-45%; d.) TTE               03/21/2022: EF 55-60% No date: Meningioma (HCC)     Comment:  a.) CT head and MRI brain 08/21/2021: LEFT parietal               meningioma No date: Myocardial infarction (HCC) 04/30/2022: Nausea vomiting and diarrhea No date: Orthostatic hypotension No date: PAD (peripheral artery disease) (HCC) 04/30/2022: Sepsis secondary to UTI (HCC) No date: Tobacco use   Reproductive/Obstetrics                             Anesthesia Physical Anesthesia Plan  ASA: 3  Anesthesia Plan: General   Post-op Pain Management:    Induction: Intravenous  PONV Risk Score and Plan: 3 and Ondansetron, Dexamethasone, Treatment may vary due to age or medical condition, Propofol infusion and TIVA  Airway Management Planned: Natural Airway  Additional Equipment:   Intra-op Plan:   Post-operative Plan:   Informed Consent: I have reviewed the patients History and Physical, chart, labs and discussed the procedure including the risks, benefits and alternatives for the proposed anesthesia with the patient or authorized representative who has indicated his/her understanding and acceptance.       Plan Discussed with: CRNA  Anesthesia Plan Comments: (LMA/GETA backup discussed.  Patient consented for risks of anesthesia including but not limited to:  - adverse reactions to medications - damage to eyes, teeth, lips or other oral mucosa - nerve damage due to positioning  - sore throat or hoarseness - damage to heart, brain, nerves, lungs, other parts of body or loss of life  Informed patient about role of CRNA in peri- and intra-operative care.  Patient voiced understanding.)        Anesthesia Quick Evaluation

## 2022-07-09 NOTE — Plan of Care (Signed)

## 2022-07-09 NOTE — Consult Note (Signed)
Pharmacy Antibiotic Note  Beth Carroll is a 52 y.o. female admitted on 07/05/2022 with  hypotension and concern for diabetic foot infection . Patient recently had right big toe amputation and was placed on doxycycline for prophylactic infectious coverage. Was seen by the podiatrist today with noted lower limit of normal blood pressures. And was redirected to the ER for further evaluation. Pharmacy has been consulted for Vancomycin and Cefepime dosing.  Plan: Change to vancomycin 1 g IV every 12 hours Vancomycin peak 6/13 @ 1242 = 22 ug/ml Vancomycin trough 6/14 @ 0926 =  6 ug/ml AUC = 305, half-life = 11.1 hours on vancomycin 1250 mg IV every 24 hours Estimated AUC with new dose: 489.2, Cmin 14.1  Ciprofloxacin 400 mg IV every 12 hours  Pharmacy will follow renal function and levels for adjustments   Height: 5\' 6"  (167.6 cm) Weight: 53.5 kg (118 lb) IBW/kg (Calculated) : 59.3  Temp (24hrs), Avg:98.3 F (36.8 C), Min:98.2 F (36.8 C), Max:98.4 F (36.9 C)  Recent Labs  Lab 07/05/22 1338 07/05/22 1903 07/06/22 0346 07/07/22 0351 07/08/22 0357 07/08/22 1242 07/09/22 0317 07/09/22 0926  WBC 5.6  --  5.5 4.5 4.5  --   --   --   CREATININE 0.54  --  0.38* 0.47 0.53  --  0.51  --   LATICACIDVEN 1.8 1.4  --   --   --   --   --   --   VANCOTROUGH  --   --   --   --   --   --   --  6*  VANCOPEAK  --   --   --   --   --  22*  --   --      Estimated Creatinine Clearance: 69.5 mL/min (by C-G formula based on SCr of 0.51 mg/dL).    No Known Allergies  Antimicrobials this admission: Vancomycin 6/10 >>  Cefepime 6/10 >> 6/14 Cipro 6/14>> Flagyl 6/10 >> Zosyn 6/10 x 1  Dose adjustments this admission: N/A  Microbiology results: 6/10 BCx: ngtd 6/10 Wound cx: MRSA (S = Doxy, Vanco, Clinda, linezolid) and Pseudomonas aeruginosa (S = cipro, gent, meropenem)  Thank you for allowing pharmacy to be a part of this patient's care.  Barrie Folk, PharmD 07/09/2022 10:50  AM

## 2022-07-10 DIAGNOSIS — E11628 Type 2 diabetes mellitus with other skin complications: Secondary | ICD-10-CM | POA: Diagnosis not present

## 2022-07-10 DIAGNOSIS — L089 Local infection of the skin and subcutaneous tissue, unspecified: Secondary | ICD-10-CM | POA: Diagnosis not present

## 2022-07-10 LAB — GLUCOSE, CAPILLARY
Glucose-Capillary: 329 mg/dL — ABNORMAL HIGH (ref 70–99)
Glucose-Capillary: 142 mg/dL — ABNORMAL HIGH (ref 70–99)
Glucose-Capillary: 160 mg/dL — ABNORMAL HIGH (ref 70–99)
Glucose-Capillary: 193 mg/dL — ABNORMAL HIGH (ref 70–99)

## 2022-07-10 LAB — CBC
HCT: 26.5 % — ABNORMAL LOW (ref 36.0–46.0)
Hemoglobin: 7.7 g/dL — ABNORMAL LOW (ref 12.0–15.0)
MCH: 23.3 pg — ABNORMAL LOW (ref 26.0–34.0)
MCHC: 29.1 g/dL — ABNORMAL LOW (ref 30.0–36.0)
MCV: 80.3 fL (ref 80.0–100.0)
Platelets: 224 10*3/uL (ref 150–400)
RBC: 3.3 MIL/uL — ABNORMAL LOW (ref 3.87–5.11)
RDW: 15.3 % (ref 11.5–15.5)
WBC: 6.3 10*3/uL (ref 4.0–10.5)
nRBC: 0 % (ref 0.0–0.2)

## 2022-07-10 LAB — CULTURE, BLOOD (ROUTINE X 2)
Culture: NO GROWTH
Special Requests: ADEQUATE

## 2022-07-10 LAB — BASIC METABOLIC PANEL
Anion gap: 7 (ref 5–15)
BUN: 19 mg/dL (ref 6–20)
CO2: 24 mmol/L (ref 22–32)
Calcium: 8.3 mg/dL — ABNORMAL LOW (ref 8.9–10.3)
Chloride: 104 mmol/L (ref 98–111)
Creatinine, Ser: 0.59 mg/dL (ref 0.44–1.00)
GFR, Estimated: >60 mL/min (ref >60)
Glucose, Bld: 486 mg/dL — ABNORMAL HIGH (ref 70–99)
Potassium: 3.5 mmol/L (ref 3.5–5.1)
Sodium: 135 mmol/L (ref 135–145)

## 2022-07-10 MED ORDER — CLOPIDOGREL BISULFATE 75 MG PO TABS
75.0000 mg | ORAL_TABLET | Freq: Every day | ORAL | Status: DC
  Administered 2022-07-10 – 2022-07-14 (×5): 75 mg via ORAL
  Filled 2022-07-10 (×5): qty 1

## 2022-07-10 MED ORDER — APIXABAN 5 MG PO TABS
5.0000 mg | ORAL_TABLET | Freq: Two times a day (BID) | ORAL | Status: DC
  Administered 2022-07-10 – 2022-07-14 (×9): 5 mg via ORAL
  Filled 2022-07-10 (×9): qty 1

## 2022-07-10 NOTE — TOC Progression Note (Signed)
Transition of Care [facility]) - Progression Note    Patient Details  Name: Beth Carroll MRN: [id number] Date of Birth: 1970-02-21  [facility] CM/SW Contact  Allena Katz, LCSW Phone Number: 07/10/2022, 2:45 PM  Clinical Narrative:    PT/OT recs pending. Surgical path pending. TOC following.     Expected Discharge Plan:  (Pending Medical work up) Barriers to Discharge: No Barriers Identified  Expected Discharge Plan and Services   Discharge Planning Services: CM Consult                                           Social Determinants of Health (SDOH) Interventions SDOH Screenings   Food Insecurity: No Food Insecurity (07/05/2022)  Housing: Low Risk  (07/05/2022)  Transportation Needs: No Transportation Needs (07/05/2022)  Utilities: Not At Risk (07/05/2022)  Depression (PHQ2-9): Low Risk  (04/13/2021)  Tobacco Use: Medium Risk (07/09/2022)    Readmission Risk Interventions     No data to display

## 2022-07-10 NOTE — Plan of Care (Signed)
  Problem: Education: Goal: Knowledge of General Education information will improve Description Including pain rating scale, medication(s)/side effects and non-pharmacologic comfort measures Outcome: Progressing   Problem: Health Behavior/Discharge Planning: Goal: Ability to manage health-related needs will improve Outcome: Progressing   Problem: Clinical Measurements: Goal: Ability to maintain clinical measurements within normal limits will improve Outcome: Progressing Goal: Diagnostic test results will improve Outcome: Progressing   Problem: Activity: Goal: Risk for activity intolerance will decrease Outcome: Progressing   Problem: Nutrition: Goal: Adequate nutrition will be maintained Outcome: Progressing   Problem: Pain Managment: Goal: General experience of comfort will improve Outcome: Progressing   

## 2022-07-10 NOTE — Progress Notes (Signed)
PROGRESS NOTE    Beth Carroll   ZOX:[id number] DOB: Oct 29, 1970  DOA: 07/05/2022 Date of Service: 07/10/22 PCP: Gracelyn Nurse, MD     Brief Narrative / Hospital Course:  Beth Carroll is a 52 y.o. female with medical history significant of multiple medical issues including peripheral artery disease, hyperglycemia with type 2 diabetes, GERD, HFpEF, coronary artery disease presenting to ED 07/05/2022 with right diabetic foot infection. Recent 1st and 2nd R toe amputations 06/03/2022 w/ Dr Wyn Quaker. Seen in follow up 05/31 in vascular office, some drainage and bleeding / serous discharge, advised elevation of limb and also Rx abx. Seen at wound care 06/03 and 06/10. On 06/10, pt reporting weakness/fatigue and noted to have low BP in wound clinic. was sent to ED.  06/10: Possible osteomyelitis. Admitted to hospitalist service.  06/11: Vascular recs for podiatry eval, podiatry plans for I&D but need to be off Plavix and Eliquis so plan for 06/14.  06/12: BCx NGx2d. Wound Cx (+)GPC 06/13: Wound Cx mod staph aureus and few pseudomonas, pend suscept 06/14: abx to cipro. I&D infected soft tissue and bone right forefoot.  today.  06/15: PT/OT recs pending. Surgical path pending. Restart Eliquis + Plavix  Consultants:  Vascular surgery  Podiatry   Procedures: 07/09/2022 I&D infected soft tissue and bone right forefoot       ASSESSMENT & PLAN:   Principal Problem:   Diabetic foot infection (HCC) Active Problems:   Hyperglycemia due to type 2 diabetes mellitus (HCC)   Chronic systolic CHF (congestive heart failure) (HCC)   Essential hypertension   PAD (peripheral artery disease) (HCC)   CAD S/P percutaneous coronary angioplasty   GERD without esophagitis   Dyslipidemia   COPD (chronic obstructive pulmonary disease) (HCC)   Atrial fibrillation (HCC)   Malnutrition of moderate degree   Diabetic foot infection (HCC) Cx(+) mod staph aureus and few pseudomonas S/p 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 Wyn Quaker S/p I&D infected soft tissue and bone right forefoot 07/09/2022 Abx transitioned to po cipro Blood cultures NGx2d Vascular surgery and podiatry teams following  Surgical path pending, if clear margins may be able to send home on po abx vs IV abx / ID consult   Chronic systolic CHF (congestive heart failure) (HCC) 2D echo February 2024 with EF of 50 to 55% Appears euvolemic Monitor volume status closely  Anemia Monitor CBC Transfuse as needed / Hgb <7 or Hgb <8 w/ complications - pt has verbally consented to transfusion if needed   CAD S/P percutaneous coronary angioplasty No active chest pain Continue home ASA, atorvastatin   COPD (chronic obstructive pulmonary disease) (HCC) Stable, no acute exacerbation  Continue home inhalers  Essential hypertension BP stable Titrate home regimen as needed  Added amlodipine for now, may consider ACE/ARB +/- diuretic on discharge   GERD without esophagitis PPI  Hyperglycemia due to type 2 diabetes mellitus (HCC) Blood sugars in 300s on presentation Major contributor to wound complications which was discussed with the patient Continue home long-acting insulin - increased dose to 35 units daily   Sliding-scale novolog meal times   PAD (peripheral artery disease) (HCC) Restart ASA/Plavix   Dyslipidemia Continue statin  Abnormal EKG Noted atrial fibrillation on EKG but substantial artifact, difficult to assess P waves, suspect actually NSR  Had stable ECHO 02/2022  Cardiology consult here if decompensates, vs outpatient f/u as clinically indicated.  Telemetry for now appears sinus rhythm     DVT prophylaxis: Eliquis  Pertinent IV fluids/nutrition: d/c continuous IV fluids  Central lines / invasive devices: none  Code Status: FULL CODE ACP documentation reviewed: 07/06/22 none on file [name]   Current Admission Status: inpatient   TOC needs / Dispo plan: anticipate HH,  possible SNF rehab, PT/OT to assess following procedure  Barriers to discharge / significant pending items: pending path results from surgery to determine abx plan              Subjective / Brief ROS:  Pt doing well post op Pain controlled Working w/ PT/OT   Family Communication: none at this time     Objective Findings:  Vitals:   07/09/22 1811 07/10/22 0038 07/10/22 0447 07/10/22 0802  BP: 126/68 121/84 129/78 (!) 151/84  Pulse: 72 84 78 87  Resp: 18 18 18 17   Temp: 97.8 F (36.6 C) 98.1 F (36.7 C) 98.1 F (36.7 C) 98.5 F (36.9 C)  TempSrc:      SpO2: 100% 100% 100% 100%  Weight:      Height:        Intake/Output Summary (Last 24 hours) at 07/10/2022 1230 Last data filed at 07/10/2022 0450 Gross per 24 hour  Intake 1093.38 ml  Output 1050 ml  Net 43.38 ml   Filed Weights   07/05/22 1322 07/09/22 1533  Weight: 53.5 kg 53.5 kg    Examination:  Physical Exam Constitutional:      General: She is not in acute distress. Cardiovascular:     Rate and Rhythm: Normal rate and regular rhythm.  Pulmonary:     Effort: Pulmonary effort is normal.     Breath sounds: Normal breath sounds.  Neurological:     Mental Status: She is alert. Mental status is at baseline.  Psychiatric:        Mood and Affect: Mood normal.        Behavior: Behavior normal.          Scheduled Medications:   amLODipine  10 mg Oral Daily   apixaban  5 mg Oral BID   vitamin C  500 mg Oral BID   aspirin EC  81 mg Oral Daily   atorvastatin  80 mg Oral Daily   clopidogrel  75 mg Oral Daily   feeding supplement  1 Container Oral TID BM   furosemide  20 mg Oral Daily   insulin aspart  0-15 Units Subcutaneous TID WC   insulin aspart  0-5 Units Subcutaneous QHS   insulin aspart  8 Units Subcutaneous TID WC   insulin glargine-yfgn  35 Units Subcutaneous Daily   multivitamin with minerals  1 tablet Oral Daily   pantoprazole  40 mg Oral Daily   tiotropium  1 capsule Inhalation  Daily   zinc sulfate  220 mg Oral Daily    Continuous Infusions:  ciprofloxacin 400 mg (07/10/22 1001)   metronidazole 500 mg (07/10/22 0522)   vancomycin 1,000 mg (07/10/22 0041)    PRN Medications:  acetaminophen **OR** acetaminophen, albuterol, diphenhydrAMINE, HYDROcodone-acetaminophen, morphine injection, ondansetron **OR** ondansetron (ZOFRAN) IV  Antimicrobials from admission:  Anti-infectives (From admission, onward)    Start     Dose/Rate Route Frequency Ordered Stop   07/09/22 1200  vancomycin (VANCOCIN) IVPB 1000 mg/200 mL premix        1,000 mg 200 mL/hr over 60 Minutes Intravenous Every 12 hours 07/09/22 1049     07/09/22 1000  ciprofloxacin (CIPRO) IVPB 400 mg        400 mg 200 mL/hr over  60 Minutes Intravenous Every 12 hours 07/09/22 0903     07/06/22 1000  vancomycin (VANCOREADY) IVPB 1250 mg/250 mL  Status:  Discontinued        1,250 mg 166.7 mL/hr over 90 Minutes Intravenous Every 24 hours 07/05/22 1922 07/09/22 1048   07/05/22 2200  ceFEPIme (MAXIPIME) 2 g in sodium chloride 0.9 % 100 mL IVPB  Status:  Discontinued        2 g 200 mL/hr over 30 Minutes Intravenous Every 8 hours 07/05/22 1922 07/09/22 0902   07/05/22 1745  metroNIDAZOLE (FLAGYL) IVPB 500 mg        500 mg 100 mL/hr over 60 Minutes Intravenous Every 12 hours 07/05/22 1731 07/12/22 1744   07/05/22 1615  vancomycin (VANCOCIN) IVPB 1000 mg/200 mL premix        1,000 mg 200 mL/hr over 60 Minutes Intravenous  Once 07/05/22 1609 07/05/22 1822   07/05/22 1615  piperacillin-tazobactam (ZOSYN) IVPB 3.375 g        3.375 g 100 mL/hr over 30 Minutes Intravenous  Once 07/05/22 1609 07/05/22 1721           Data Reviewed:  I have personally reviewed the following...  CBC: Recent Labs  Lab 07/05/22 1338 07/06/22 0346 07/07/22 0351 07/08/22 0357 07/09/22 0317 07/10/22 0410  WBC 5.6 5.5 4.5 4.5  --  6.3  NEUTROABS 4.2  --   --   --   --   --   HGB 9.3* 8.5* 7.6* 7.6* 8.2* 7.7*  HCT 32.7*  28.2* 26.2* 26.4* 28.1* 26.5*  MCV 80.5 77.7* 79.6* 79.5*  --  80.3  PLT 281 250 222 214  --  224   Basic Metabolic Panel: Recent Labs  Lab 07/05/22 1338 07/06/22 0346 07/07/22 0351 07/08/22 0357 07/09/22 0317 07/10/22 0410  NA 134* 136 136 136  --  135  K 3.8 3.4* 3.4* 3.5  --  3.5  CL 99 104 105 107  --  104  CO2 27 26 27 24   --  24  GLUCOSE 308* 341* 395* 321*  --  486*  BUN 21* 16 16 24*  --  19  CREATININE 0.54 0.38* 0.47 0.53 0.51 0.59  CALCIUM 9.2 8.6* 8.1* 8.6*  --  8.3*   GFR: Estimated Creatinine Clearance: 69.5 mL/min (by C-G formula based on SCr of 0.59 mg/dL). Liver Function Tests: Recent Labs  Lab 07/05/22 1338 07/06/22 0346  AST 21 13*  ALT 16 12  ALKPHOS 104 88  BILITOT 0.7 0.6  PROT 7.4 6.6  ALBUMIN 3.6 3.1*   No results for input(s): "LIPASE", "AMYLASE" in the last 168 hours. No results for input(s): "AMMONIA" in the last 168 hours. Coagulation Profile: No results for input(s): "INR", "PROTIME" in the last 168 hours. Cardiac Enzymes: No results for input(s): "CKTOTAL", "CKMB", "CKMBINDEX", "TROPONINI" in the last 168 hours. BNP (last 3 results) No results for input(s): "PROBNP" in the last 8760 hours. HbA1C: No results for input(s): "HGBA1C" in the last 72 hours. CBG: Recent Labs  Lab 07/09/22 1515 07/09/22 1714 07/09/22 2122 07/10/22 0802 07/10/22 1229  GLUCAP 207* 69* 173* 329* 142*   Lipid Profile: No results for input(s): "CHOL", "HDL", "LDLCALC", "TRIG", "CHOLHDL", "LDLDIRECT" in the last 72 hours. Thyroid Function Tests: No results for input(s): "TSH", "T4TOTAL", "FREET4", "T3FREE", "THYROIDAB" in the last 72 hours. Anemia Panel: No results for input(s): "VITAMINB12", "FOLATE", "FERRITIN", "TIBC", "IRON", "RETICCTPCT" in the last 72 hours. Most Recent Urinalysis On File:     Component  Value Date/Time   COLORURINE YELLOW (A) 06/01/2022 1800   APPEARANCEUR HAZY (A) 06/01/2022 1800   APPEARANCEUR Clear 08/30/2012 0047   LABSPEC  1.008 06/01/2022 1800   LABSPEC 1.014 08/30/2012 0047   PHURINE 7.0 06/01/2022 1800   GLUCOSEU >=500 (A) 06/01/2022 1800   GLUCOSEU >=500 08/30/2012 0047   HGBUR MODERATE (A) 06/01/2022 1800   BILIRUBINUR NEGATIVE 06/01/2022 1800   BILIRUBINUR Negative 08/30/2012 0047   KETONESUR NEGATIVE 06/01/2022 1800   PROTEINUR NEGATIVE 06/01/2022 1800   NITRITE NEGATIVE 06/01/2022 1800   LEUKOCYTESUR LARGE (A) 06/01/2022 1800   LEUKOCYTESUR 1+ 08/30/2012 0047   Sepsis Labs: @LABRCNTIP (procalcitonin:4,lacticidven:4) Microbiology: Recent Results (from the past 240 hour(s))  Blood culture (routine x 2)     Status: None   Collection Time: 07/05/22  2:04 PM   Specimen: BLOOD  Result Value Ref Range Status   Specimen Description BLOOD BLOOD RIGHT FOREARM  Final   Special Requests   Final    BOTTLES DRAWN AEROBIC AND ANAEROBIC Blood Culture adequate volume   Culture   Final    NO GROWTH 5 DAYS Performed at [facility], [address] [place], [place] [address]    Report Status 07/10/2022 FINAL  Final  Blood culture (routine x 2)     Status: None   Collection Time: 07/05/22  2:04 PM   Specimen: BLOOD  Result Value Ref Range Status   Specimen Description BLOOD BLOOD RIGHT ARM  Final   Special Requests   Final    BOTTLES DRAWN AEROBIC AND ANAEROBIC Blood Culture results may not be optimal due to an excessive volume of blood received in culture bottles   Culture   Final    NO GROWTH 5 DAYS Performed at [facility], [address], [place], [place] [address]    Report Status 07/10/2022 FINAL  Final  Aerobic/Anaerobic Culture w Gram Stain (surgical/deep wound)     Status: None (Preliminary result)   Collection Time: 07/05/22  3:40 PM   Specimen: Foot  Result Value Ref Range Status   Specimen Description   Final    FOOT Performed at [facility], [address], [place], [place] [address]    Special Requests   Final    RF Performed at [facility],  [address] [place], [place] [address]    Gram Stain   Final    RARE WBC PRESENT, PREDOMINANTLY MONONUCLEAR RARE GRAM POSITIVE COCCI IN SINGLES Performed at [facility], [address] [place], [place] [address]    Culture   Final    MODERATE METHICILLIN RESISTANT STAPHYLOCOCCUS AUREUS FEW PSEUDOMONAS AERUGINOSA NO ANAEROBES ISOLATED; CULTURE IN PROGRESS FOR 5 DAYS    Report Status PENDING  Incomplete   Organism ID, Bacteria METHICILLIN RESISTANT STAPHYLOCOCCUS AUREUS  Final   Organism ID, Bacteria PSEUDOMONAS AERUGINOSA  Final      Susceptibility   Methicillin resistant staphylococcus aureus - MIC*    CIPROFLOXACIN >=8 RESISTANT Resistant     ERYTHROMYCIN >=8 RESISTANT Resistant     GENTAMICIN <=0.5 SENSITIVE Sensitive     OXACILLIN >=4 RESISTANT Resistant     TETRACYCLINE <=1 SENSITIVE Sensitive     VANCOMYCIN <=0.5 SENSITIVE Sensitive     TRIMETH/SULFA >=320 RESISTANT Resistant     CLINDAMYCIN <=0.25 SENSITIVE Sensitive     RIFAMPIN <=0.5 SENSITIVE Sensitive     Inducible Clindamycin NEGATIVE Sensitive     LINEZOLID 2 SENSITIVE Sensitive     * MODERATE METHICILLIN RESISTANT STAPHYLOCOCCUS AUREUS  Pseudomonas aeruginosa - MIC*    CEFTAZIDIME >=64 RESISTANT Resistant     CIPROFLOXACIN <=0.25 SENSITIVE Sensitive     GENTAMICIN <=1 SENSITIVE Sensitive     IMIPENEM 1 SENSITIVE Sensitive     PIP/TAZO >=128 RESISTANT Resistant     * FEW PSEUDOMONAS AERUGINOSA  Aerobic/Anaerobic Culture w Gram Stain (surgical/deep wound)     Status: None (Preliminary result)   Collection Time: 07/09/22  4:45 PM   Specimen: Foot, Left; Tissue  Result Value Ref Range Status   Specimen Description   Final    WOUND Performed at [facility], [address] [place], [place] [address]    Special Requests LEFT FOOT  Final   Gram Stain NO WBC SEEN NO ORGANISMS SEEN   Final   Culture   Final    CULTURE REINCUBATED FOR BETTER GROWTH Performed at [facility],  [address], [place], [place] [address]    Report Status PENDING  Incomplete      Radiology Studies last 3 days: No results found.           LOS: 5 days        Sunnie Nielsen, DO [facility] 07/10/2022, 12:30 PM    Dictation software may have been used to generate the above note. Typos may occur and escape review in typed/dictated notes. Please contact Dr Lyn Hollingshead directly for clarity if needed.  Staff may message me via secure chat in Epic  but this may not receive an immediate response,  please page me for urgent matters!  If 7PM-7AM, please contact night coverage [email or web address]

## 2022-07-10 NOTE — Progress Notes (Signed)
1 Day Post-Op   Subjective/Chief Complaint: Patient seen.  Some pain in the right foot overnight.   Objective: Vital signs in last 24 hours: Temp:  [97.1 F (36.2 C)-98.5 F (36.9 C)] 98.5 F (36.9 C) (06/15 0802) Pulse Rate:  [63-87] 87 (06/15 0802) Resp:  [11-18] 17 (06/15 0802) BP: (107-151)/(61-84) 151/84 (06/15 0802) SpO2:  [98 %-100 %] 100 % (06/15 0802) Weight:  [53.5 kg] 53.5 kg (06/14 1533) Last BM Date : 07/09/22  Intake/Output from previous day: 06/14 0701 - 06/15 0700 In: 1093.4 [I.V.:500; IV Piggyback:593.4] Out: 1600 [Urine:1600] Intake/Output this shift: No intake/output data recorded.  Bandage on the right foot is dry and intact.  Moderately heavy bleeding is noted on the bandaging.  Upon removal the incision appears well coapted with no significant cellulitis.  Some residual edema.  No purulence noted.  Skin edges viable.  Lab Results:  Recent Labs    07/08/22 0357 07/09/22 0317 07/10/22 0410  WBC 4.5  --  6.3  HGB 7.6* 8.2* 7.7*  HCT 26.4* 28.1* 26.5*  PLT 214  --  224   BMET Recent Labs    07/08/22 0357 07/09/22 0317 07/10/22 0410  NA 136  --  135  K 3.5  --  3.5  CL 107  --  104  CO2 24  --  24  GLUCOSE 321*  --  486*  BUN 24*  --  19  CREATININE 0.53 0.51 0.59  CALCIUM 8.6*  --  8.3*   PT/INR No results for input(s): "LABPROT", "INR" in the last 72 hours. ABG No results for input(s): "PHART", "HCO3" in the last 72 hours.  Invalid input(s): "PCO2", "PO2"  Studies/Results: No results found.  Anti-infectives: Anti-infectives (From admission, onward)    Start     Dose/Rate Route Frequency Ordered Stop   07/09/22 1200  vancomycin (VANCOCIN) IVPB 1000 mg/200 mL premix        1,000 mg 200 mL/hr over 60 Minutes Intravenous Every 12 hours 07/09/22 1049     07/09/22 1000  ciprofloxacin (CIPRO) IVPB 400 mg        400 mg 200 mL/hr over 60 Minutes Intravenous Every 12 hours 07/09/22 0903     07/06/22 1000  vancomycin (VANCOREADY) IVPB  1250 mg/250 mL  Status:  Discontinued        1,250 mg 166.7 mL/hr over 90 Minutes Intravenous Every 24 hours 07/05/22 1922 07/09/22 1048   07/05/22 2200  ceFEPIme (MAXIPIME) 2 g in sodium chloride 0.9 % 100 mL IVPB  Status:  Discontinued        2 g 200 mL/hr over 30 Minutes Intravenous Every 8 hours 07/05/22 1922 07/09/22 0902   07/05/22 1745  metroNIDAZOLE (FLAGYL) IVPB 500 mg        500 mg 100 mL/hr over 60 Minutes Intravenous Every 12 hours 07/05/22 1731 07/12/22 1744   07/05/22 1615  vancomycin (VANCOCIN) IVPB 1000 mg/200 mL premix        1,000 mg 200 mL/hr over 60 Minutes Intravenous  Once 07/05/22 1609 07/05/22 1822   07/05/22 1615  piperacillin-tazobactam (ZOSYN) IVPB 3.375 g        3.375 g 100 mL/hr over 30 Minutes Intravenous  Once 07/05/22 1609 07/05/22 1721       Assessment/Plan: s/p Procedure(s): IRRIGATION AND DEBRIDEMENT WOUND (Right) Assessment: Stable status post debridement right foot.  Plan: Betadine gauze applied over the incision followed by a bulky gauze bandage with Kerlix and Ace wrap.  Order Prevalon boots for protection  of her heel ulcers.  Physical therapy may begin trying to work with the patient to see if she can perform weightbearing with pressure on the right foot only on the heel.  Superficial culture taken a few days ago is showing MRSA and Pseudomonas.  Will most likely need infectious disease consult potentially for IV antibiotics on discharge.  If pathology returns with clean margins could potentially consider orals.  Plan for redressing the foot on Monday.  LOS: 5 days    Ricci Barker 07/10/2022

## 2022-07-10 NOTE — Evaluation (Signed)
Physical Therapy Evaluation Patient Details Name: Beth Carroll MRN: [id number] DOB: 1970/07/07 Today's Date: 07/10/2022  History of Present Illness  Pt is a 52 y.o. female presenting to hospital from wound care clinic with hypotension.  Pt admitted with diabetic foot infection, hyperglycemia, and atrial fibrillation.  S/p I&D infected soft tissue and bone R forefoot d/t osteomyelitis R foot.  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  Clinical Impression  Prior to hospital admission, pt was modified independent with w/c level transfers using slideboard; lives in 1 level home with ramp to enter; pt's daughter currently staying with pt to assist as needed.  Currently pt is min assist for R LE (to maintain NWB'ing status) for slide-board transfer recliner to bed (incline for transfer).  Limited activity d/t pt reporting feeling lightheaded (pt's BP 86/60 in sitting--NT took pt's vitals and notified pt's nurse).  Pt with questions regarding brace for L foot (pt showed therapist picture of multi-podus boot her HHPT was looking at possibly obtaining for pt); discussed with Dr. Alberteen Spindle; Dr. Alberteen Spindle placed orders for B prevalon boots to offload heel ulcers.  Pt would currently benefit from skilled PT to address noted impairments and functional limitations (see below for any additional details).  Upon hospital discharge, pt would benefit from ongoing therapy.    Recommendations for follow up therapy are one component of a multi-disciplinary discharge planning process, led by the attending physician.  Recommendations may be updated based on patient status, additional functional criteria and insurance authorization.        Assistance Recommended at Discharge Frequent or constant Supervision/Assistance  Patient can return  home with the following  A little help with walking and/or transfers;A little help with bathing/dressing/bathroom;Assistance with cooking/housework;Assist for transportation;Help with stairs or ramp for entrance    Equipment Recommendations Other (comment) (pt has needed DME at home already)  Recommendations for Other Services       Functional Status Assessment Patient has had a recent decline in their functional status and demonstrates the ability to make significant improvements in function in a reasonable and predictable amount of time.     Precautions / Restrictions Precautions Precautions: Fall Precaution Comments: Pt reports using sliding board at baseline over the last several months d/t bilat heel wounds, though she reports wound center gave her the ok to start bearing some weight into L foot Restrictions Weight Bearing Restrictions: Yes RLE Weight Bearing: Non weight bearing      Mobility  Bed Mobility Overal bed mobility: Needs Assistance Bed Mobility: Sit to Supine       Sit to supine: Supervision   General bed mobility comments: increased effort to perform on own    Transfers Overall transfer level: Needs assistance (min assist) Equipment used: Sliding board Transfers: Bed to chair/wheelchair/BSC             General transfer comment: slideboard transfer (incline recliner to bed) with min assist to maintain NWB R LE; assist for slideboard set-up but no physical assist required for transfer otherwise    Ambulation/Gait               General Gait Details: Deferred--pt currently non-ambulatory.  Stairs            Wheelchair Mobility    Modified Rankin (Stroke Patients Only)       Balance Overall  balance assessment: Needs assistance Sitting-balance support: No upper extremity supported, Feet unsupported Sitting balance-Leahy Scale: Good Sitting balance - Comments: steady reaching within BOS                                      Pertinent Vitals/Pain Pain Assessment Pain Assessment: 0-10 Pain Score: 8  Pain Location: R foot Pain Descriptors / Indicators: Aching, Throbbing Pain Intervention(s): Limited activity within patient's tolerance, Monitored during session, Premedicated before session, Repositioned HR and O2 sats on room air WFL during sessions activities.    Home Living Family/patient expects to be discharged to:: Private residence Living Arrangements: Children (pt's daughter currently staying with pt to assist as needed) Available Help at Discharge: Family;Available 24 hours/day Type of Home: Mobile home Home Access: Ramped entrance       Home Layout: One level Home Equipment: Cane - Programmer, applications (2 wheels);BSC/3in1;Shower seat;Wheelchair - manual;Hospital bed;Other (comment) (slideboard)      Prior Function Prior Level of Function : Needs assist             Mobility Comments: Pt reports being non-ambulatory over the last several months d/t LE wounds (using sliding board and manual w/c). ADLs Comments: Per OT eval "Pt reports she had a bath aid from home health prior to admission; family assisted with IADLs".  Pt reports using bedpan for toileting.     Hand Dominance   Dominant Hand: Right    Extremity/Trunk Assessment   Upper Extremity Assessment Upper Extremity Assessment: Generalized weakness    Lower Extremity Assessment Lower Extremity Assessment: Generalized weakness;RLE deficits/detail;LLE deficits/detail RLE Deficits / Details: at least 3/5 AROM hip flexion and knee flexion/extension; R ankle/foot deferred d/t pain s/p surgery RLE: Unable to fully assess due to pain LLE Deficits / Details: at least 3/5 AROM hip flexion and knee flexion/extension; DF 2-/5; L DF grossly 20 degrees short of neutral    Cervical / Trunk Assessment Cervical / Trunk Assessment: Normal  Communication   Communication: No difficulties  Cognition Arousal/Alertness:  Awake/alert Behavior During Therapy: WFL for tasks assessed/performed Overall Cognitive Status: Within Functional Limits for tasks assessed                                          General Comments  Pt agreeable to therapy session.    Exercises     Assessment/Plan    PT Assessment Patient needs continued PT services  PT Problem List Decreased strength;Decreased activity tolerance;Decreased balance;Decreased mobility;Decreased knowledge of precautions;Decreased skin integrity;Pain       PT Treatment Interventions DME instruction;Functional mobility training;Therapeutic activities;Therapeutic exercise;Balance training;Patient/family education    PT Goals (Current goals can be found in the Care Plan section)  Acute Rehab PT Goals Patient Stated Goal: to improve strength and mobility PT Goal Formulation: With patient Time For Goal Achievement: 07/24/22 Potential to Achieve Goals: Good    Frequency 7X/week     Co-evaluation               AM-PAC PT "6 Clicks" Mobility  Outcome Measure Help needed turning from your back to your side while in a flat bed without using bedrails?: None Help needed moving from lying on your back to sitting on the side of a flat bed without using bedrails?: A Little Help needed moving to and from a  bed to a chair (including a wheelchair)?: A Little Help needed standing up from a chair using your arms (e.g., wheelchair or bedside chair)?: A Little Help needed to walk in hospital room?: Total Help needed climbing 3-5 steps with a railing? : Total 6 Click Score: 15    End of Session Equipment Utilized During Treatment: Gait belt Activity Tolerance: Patient tolerated treatment well Patient left: in bed;with call bell/phone within reach;with bed alarm set (Dr. Alberteen Spindle present addressing R foot) Nurse Communication: Mobility status;Precautions;Other (comment);Weight bearing status (pt's low BP) PT Visit Diagnosis: Other  abnormalities of gait and mobility (R26.89);Muscle weakness (generalized) (M62.81);Pain Pain - Right/Left: Right Pain - part of body: Ankle and joints of foot    Time: 1020-1041 PT Time Calculation (min) (ACUTE ONLY): 21 min   Charges:   PT Evaluation $PT Eval Low Complexity: 1 Low PT Treatments $Therapeutic Activity: 8-22 mins       Hendricks Limes, PT 07/10/22, 3:51 PM

## 2022-07-10 NOTE — Evaluation (Signed)
Occupational Therapy Evaluation Patient Details Name: Tian Stelmack MRN: [id number] DOB: 21-May-1970 Today's Date: 07/10/2022   History of Present Illness Khala Arave is a 52 y.o. female with medical history significant of multiple medical issues including peripheral artery disease, hyperglycemia with type 2 diabetes, GERD, HFpEF, coronary artery disease presenting with right diabetic foot infection.   Clinical Impression   Pt seen for OT evaluation this date.  S/p I&D R foot with recent R foot 1st and 2nd toe amputations.  Pt is NWB to RLE and reports she's been unable to bear much weight into the L foot until recently d/t heel wounds, so she's been using a sliding board with set up assist at home, and using wc around the house.  Pt presents with generalized weakness and recent decline in ADLs and mobility.  Pt was receiving HH services, including PT/OT/HHA prior to admission and would like to restart this upon d/c.  Pt plans to have daughter stay with her for caregiving assist as pt's boyfriend, who also lives in the home, just had a CVA.  Pt would benefit from continued skilled OT in the acute setting for BUE strengthening, ADL training, and functional transfer training.  Red theraband issued at eval per pt's request as she was using this at home pta.  Will plan to review HEP in a future session.  Recommend HH at d/c.      Recommendations for follow up therapy are one component of a multi-disciplinary discharge planning process, led by the attending physician.  Recommendations may be updated based on patient status, additional functional criteria and insurance authorization.   Assistance Recommended at Discharge Frequent or constant Supervision/Assistance  Patient can return home with the following A little help with walking and/or transfers;A lot of help with bathing/dressing/bathroom;Assistance with cooking/housework;Assist for transportation;Help with stairs or ramp for entrance     Functional Status Assessment  Patient has had a recent decline in their functional status and demonstrates the ability to make significant improvements in function in a reasonable and predictable amount of time.  Equipment Recommendations  None recommended by OT (defer to [facility] d/t unique tub set up)    Recommendations for Other Services       Precautions / Restrictions Precautions Precautions: Fall Precaution Comments: Pt reports using sliding board at baseline over the last several months d/t bilat heel wounds, though she reports wound center gave her the ok to start bearing some weight into L foot, but pt felt too weak to attempt a pivot transfer. Restrictions Weight Bearing Restrictions: Yes RLE Weight Bearing: Non weight bearing Other Position/Activity Restrictions: Pt has post op shoe for RLE      Mobility Bed Mobility Overal bed mobility: Needs Assistance Bed Mobility: Supine to Sit     Supine to sit: Supervision     General bed mobility comments: Pt has hospital bed at home Patient Response: Cooperative  Transfers   Equipment used: Sliding board               General transfer comment: sliding board bed to recliner with min A to maintain NWB to RLE; required set up of board beneath buttocks, but no sliding assist needed      Balance Overall balance assessment: Needs assistance Sitting-balance support: Single extremity supported Sitting balance-Leahy Scale: Good Sitting balance - Comments: able to don LLE sock sitting EOB crossing L leg over R knee with supv and no LOB       Standing balance comment: NT  ADL either performed or assessed with clinical judgement   ADL Overall ADL's : Needs assistance/impaired Eating/Feeding: Independent   Grooming: Set up;Sitting               Lower Body Dressing: Moderate assistance Lower Body Dressing Details (indicate cue type and reason): able to don L sock with set up  sitting EOB with close supv; entire LB dressing would require mod A at bed or chair level Toilet Transfer: Maximal assistance Toilet Transfer Details (indicate cue type and reason): currently using bed pan and purewick; pt used BSC at baseline in the home           General ADL Comments: bed or chair level for all ADLs at present     Vision Baseline Vision/History: 1 Wears glasses Patient Visual Report: No change from baseline       Perception     Praxis      Pertinent Vitals/Pain Pain Assessment Pain Assessment: 0-10 Pain Score: 7  Pain Location: R foot Pain Descriptors / Indicators: Aching, Throbbing Pain Intervention(s): Premedicated before session, Repositioned, Limited activity within patient's tolerance, Monitored during session     Hand Dominance Right   Extremity/Trunk Assessment Upper Extremity Assessment Upper Extremity Assessment: Generalized weakness   Lower Extremity Assessment Lower Extremity Assessment: Generalized weakness;Defer to PT evaluation       Communication Communication Communication: No difficulties   Cognition Arousal/Alertness: Awake/alert Behavior During Therapy: WFL for tasks assessed/performed Overall Cognitive Status: Within Functional Limits for tasks assessed                                       General Comments  R buttock itchy, widespread redness both R/L buttock, L heel covered with bandage; R foot wrapped post surgical    Exercises Other Exercises Other Exercises: glute squeezes x10, ankle pumps x10; encouraged completion hourly for circulation and pressure relief   Shoulder Instructions      Home Living Family/patient expects to be discharged to:: Private residence Living Arrangements: Spouse/significant other Available Help at Discharge: Family;Available 24 hours/day Type of Home: Mobile home Home Access: Ramped entrance     Home Layout: One level     Bathroom Shower/Tub: Tub/shower unit (pt  describes tub/shower as garden tub/shower combo with 1 step up)   Bathroom Toilet: Standard     Home Equipment: Cane - Programmer, applications (2 wheels);BSC/3in1;Shower seat;Wheelchair - manual;Hospital bed          Prior Functioning/Environment Prior Level of Function : Needs assist       Physical Assist : Mobility (physical);ADLs (physical) Mobility (physical): Transfers ADLs (physical): Bathing;IADLs Mobility Comments: Pt reports she's been non-ambulatory using a wc over the last several months d/t LE wounds.  Using sliding board at baseline. ADLs Comments: Pt reports she had a bath aid from home health prior to admission; family assisted with IADLs        OT Problem List: Decreased strength;Decreased knowledge of use of DME or AE;Decreased knowledge of precautions;Decreased activity tolerance;Impaired balance (sitting and/or standing);Pain      OT Treatment/Interventions: Self-care/ADL training;Therapeutic exercise;Patient/family education;Balance training;Therapeutic activities;DME and/or AE instruction    OT Goals(Current goals can be found in the care plan section) Acute Rehab OT Goals Patient Stated Goal: To go home with family support and restart HH services OT Goal Formulation: With patient Time For Goal Achievement: 07/24/22 Potential to Achieve Goals: Good ADL Goals Pt Will  Perform Lower Body Dressing: with min assist;bed level Pt Will Transfer to Toilet: with set-up;with supervision;bedside commode Pt Will Perform Toileting - Clothing Manipulation and hygiene: with min assist;sitting/lateral leans  OT Frequency: Min 1X/week    Co-evaluation              AM-PAC OT "6 Clicks" Daily Activity     Outcome Measure Help from another person eating meals?: None Help from another person taking care of personal grooming?: A Little Help from another person toileting, which includes using toliet, bedpan, or urinal?: A Lot Help from another person bathing (including  washing, rinsing, drying)?: A Little Help from another person to put on and taking off regular upper body clothing?: None Help from another person to put on and taking off regular lower body clothing?: A Lot 6 Click Score: 18   End of Session Equipment Utilized During Treatment: Gait belt  Activity Tolerance: Patient tolerated treatment well Patient left: in chair;with call bell/phone within reach  OT Visit Diagnosis: Muscle weakness (generalized) (M62.81);Pain;Other abnormalities of gait and mobility (R26.89) Pain - Right/Left: Right Pain - part of body: Leg                Time: 7846-9629 OT Time Calculation (min): 39 min Charges:  OT General Charges $OT Visit: 1 Visit OT Evaluation $OT Eval Moderate Complexity: 1 Mod Danelle Earthly, MS, OTR/L   Otis Dials 07/10/2022, 10:07 AM

## 2022-07-11 DIAGNOSIS — L089 Local infection of the skin and subcutaneous tissue, unspecified: Secondary | ICD-10-CM | POA: Diagnosis not present

## 2022-07-11 DIAGNOSIS — E11628 Type 2 diabetes mellitus with other skin complications: Secondary | ICD-10-CM | POA: Diagnosis not present

## 2022-07-11 LAB — GLUCOSE, CAPILLARY
Glucose-Capillary: 214 mg/dL — ABNORMAL HIGH (ref 70–99)
Glucose-Capillary: 169 mg/dL — ABNORMAL HIGH (ref 70–99)
Glucose-Capillary: 57 mg/dL — ABNORMAL LOW (ref 70–99)
Glucose-Capillary: 61 mg/dL — ABNORMAL LOW (ref 70–99)
Glucose-Capillary: 64 mg/dL — ABNORMAL LOW (ref 70–99)
Glucose-Capillary: 88 mg/dL (ref 70–99)
Glucose-Capillary: 217 mg/dL — ABNORMAL HIGH (ref 70–99)

## 2022-07-11 LAB — AEROBIC/ANAEROBIC CULTURE W GRAM STAIN (SURGICAL/DEEP WOUND): Gram Stain: NONE SEEN

## 2022-07-11 LAB — HEMOGLOBIN AND HEMATOCRIT, BLOOD
HCT: 26.4 % — ABNORMAL LOW (ref 36.0–46.0)
Hemoglobin: 7.5 g/dL — ABNORMAL LOW (ref 12.0–15.0)

## 2022-07-11 MED ORDER — DEXTROSE 50 % IV SOLN
1.0000 | INTRAVENOUS | Status: DC | PRN
  Filled 2022-07-11: qty 50

## 2022-07-11 NOTE — TOC Progression Note (Signed)
Transition of Care [facility]) - Progression Note    Patient Details  Name: Beth Carroll MRN: [id number] Date of Birth: 03-23-1970  [facility] CM/SW Contact  Colette Ribas, [place] Phone Number: 07/11/2022, 9:11 AM  Clinical Narrative:     CSW spoke with patient, she advised she uses HH in  for PT needs and wants to continue. CSW relayed info to MD and requested orders in chart for agency. MD updated orders.   Expected Discharge Plan:  (Pending Medical work up) Barriers to Discharge: No Barriers Identified  Expected Discharge Plan and Services   Discharge Planning Services: CM Consult                                           Social Determinants of Health (SDOH) Interventions SDOH Screenings   Food Insecurity: No Food Insecurity (07/05/2022)  Housing: Low Risk  (07/05/2022)  Transportation Needs: No Transportation Needs (07/05/2022)  Utilities: Not At Risk (07/05/2022)  Depression (PHQ2-9): Low Risk  (04/13/2021)  Tobacco Use: Medium Risk (07/09/2022)    Readmission Risk Interventions     No data to display

## 2022-07-11 NOTE — Plan of Care (Signed)
  Problem: Education: Goal: Knowledge of General Education information will improve Description: Including pain rating scale, medication(s)/side effects and non-pharmacologic comfort measures Outcome: Progressing   Problem: Health Behavior/Discharge Planning: Goal: Ability to manage health-related needs will improve Outcome: Progressing   Problem: Clinical Measurements: Goal: Ability to maintain clinical measurements within normal limits will improve Outcome: Progressing Goal: Diagnostic test results will improve Outcome: Progressing Goal: Respiratory complications will improve Outcome: Progressing Goal: Cardiovascular complication will be avoided Outcome: Progressing   Problem: Activity: Goal: Risk for activity intolerance will decrease Outcome: Progressing   Problem: Nutrition: Goal: Adequate nutrition will be maintained Outcome: Progressing   Problem: Elimination: Goal: Will not experience complications related to bowel motility Outcome: Progressing Goal: Will not experience complications related to urinary retention Outcome: Progressing   Problem: Pain Managment: Goal: General experience of comfort will improve Outcome: Progressing   

## 2022-07-11 NOTE — Evaluation (Addendum)
Physical Therapy Re-Evaluation Patient Details Name: Beth Carroll MRN: [id number] DOB: 27-Dec-1970 Today's Date: 07/11/2022  History of Present Illness  Pt is a 52 y.o. female presenting to hospital from wound care clinic with hypotension.  Pt admitted with diabetic foot infection, hyperglycemia, and atrial fibrillation.  S/p I&D infected soft tissue and bone R forefoot d/t osteomyelitis R foot.  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  Clinical Impression  Pt is upright in bed, denies any pain in RLE, but reported itchiness at IV site, nursing informed. PTA pt required assistance for IADL's and bathing and was nonambulatory due to BLE foot wounds, uses w/c and slideboard. Pt was able to perform bed mobility with supervision and transfers c/ slideboard and CGA. PT informed pt of new WB precautions. Pt left upright in chair, alarm set, and needs within reach. Pt would benefit from continued therapy services to improve mobility, strength, and functional activity tolerance.      Recommendations for follow up therapy are one component of a multi-disciplinary discharge planning process, led by the attending physician.  Recommendations may be updated based on patient status, additional functional criteria and insurance authorization.  Follow Up Recommendations       Assistance Recommended at Discharge Frequent or constant Supervision/Assistance  Patient can return home with the following  A little help with walking and/or transfers;A little help with bathing/dressing/bathroom;Assistance with cooking/housework;Assist for transportation;Help with stairs or ramp for entrance    Equipment Recommendations None recommended by PT  Recommendations for Other Services       Functional Status Assessment Patient has  had a recent decline in their functional status and demonstrates the ability to make significant improvements in function in a reasonable and predictable amount of time.     Precautions / Restrictions Precautions Precautions: Fall Restrictions Weight Bearing Restrictions: Yes RLE Weight Bearing: Partial weight bearing Other Position/Activity Restrictions: Pt has to wear post-op shoe, can only PWB through heel      Mobility  Bed Mobility Overal bed mobility: Needs Assistance Bed Mobility: Supine to Sit     Supine to sit: Supervision, HOB elevated     General bed mobility comments: increased effort to perform on own    Transfers Overall transfer level: Needs assistance Equipment used: Sliding board Transfers: Bed to chair/wheelchair/BSC             General transfer comment: slideboard transfer from bed to recliner, level height, CGA and cueing to maintain PWB on heel    Ambulation/Gait               General Gait Details: Deferred--pt currently non-ambulatory.  Stairs            Wheelchair Mobility    Modified Rankin (Stroke Patients Only)       Balance Overall balance assessment: Needs assistance Sitting-balance support: No upper extremity supported, Feet unsupported Sitting balance-Leahy Scale: Good                                       Pertinent Vitals/Pain Pain Assessment Pain Assessment: No/denies pain    Home Living Family/patient expects to be discharged to:: Private residence Living Arrangements: Children Available Help at Discharge: Family;Available 24 hours/day Type of Home: Mobile home Home Access:  Ramped entrance       Home Layout: One level Home Equipment: Cane - Programmer, applications (2 wheels);BSC/3in1;Shower seat;Wheelchair - manual;Hospital bed;Other (comment)      Prior Function Prior Level of Function : Needs assist       Physical Assist : Mobility (physical);ADLs (physical) Mobility (physical):  Transfers ADLs (physical): Bathing;IADLs Mobility Comments: Pt reports being non-ambulatory over the last several months d/t LE wounds (using sliding board and manual w/c). ADLs Comments: Per OT eval "Pt reports she had a bath aid from home health prior to admission; family assisted with IADLs".  Pt reports using bedpan for toileting.     Hand Dominance   Dominant Hand: Right    Extremity/Trunk Assessment   Upper Extremity Assessment Upper Extremity Assessment: Generalized weakness    Lower Extremity Assessment Lower Extremity Assessment: Generalized weakness    Cervical / Trunk Assessment Cervical / Trunk Assessment: Normal  Communication   Communication: No difficulties  Cognition Arousal/Alertness: Awake/alert Behavior During Therapy: WFL for tasks assessed/performed Overall Cognitive Status: Within Functional Limits for tasks assessed                                          General Comments General comments (skin integrity, edema, etc.): itchy IV site, nursing informed. R foot in post surgical wrap    Exercises     Assessment/Plan    PT Assessment Patient needs continued PT services  PT Problem List Decreased strength;Decreased activity tolerance;Decreased balance;Decreased mobility;Decreased knowledge of precautions;Decreased skin integrity;Impaired sensation;Decreased coordination;Decreased range of motion;Decreased knowledge of use of DME       PT Treatment Interventions DME instruction;Neuromuscular re-education;Functional mobility training;Patient/family education;Therapeutic activities;Therapeutic exercise    PT Goals (Current goals can be found in the Care Plan section)  Acute Rehab PT Goals Patient Stated Goal: to improve strength and mobility PT Goal Formulation: With patient Time For Goal Achievement: 07/25/22 Potential to Achieve Goals: Good    Frequency Min 2X/week     Co-evaluation               AM-PAC PT "6 Clicks"  Mobility  Outcome Measure Help needed turning from your back to your side while in a flat bed without using bedrails?: None Help needed moving from lying on your back to sitting on the side of a flat bed without using bedrails?: None Help needed moving to and from a bed to a chair (including a wheelchair)?: A Little Help needed standing up from a chair using your arms (e.g., wheelchair or bedside chair)?: A Little Help needed to walk in hospital room?: Total Help needed climbing 3-5 steps with a railing? : Total 6 Click Score: 16    End of Session   Activity Tolerance: Patient tolerated treatment well Patient left: in chair;with chair alarm set;with call bell/phone within reach Nurse Communication: Mobility status;Weight bearing status PT Visit Diagnosis: Other abnormalities of gait and mobility (R26.89);Muscle weakness (generalized) (M62.81)    Time: 1610-9604 PT Time Calculation (min) (ACUTE ONLY): 26 min   Charges:   PT Evaluation $PT Re-evaluation: 1 Re-eval PT Treatments $Therapeutic Activity: 8-22 mins       Lala Lund, PT, SPT  12:12 PM,07/11/22

## 2022-07-11 NOTE — Progress Notes (Signed)
PROGRESS NOTE    Beth Carroll   ZOX:[id number] DOB: [birth year]  DOA: 07/05/2022 Date of Service: 07/11/22 PCP: Gracelyn Nurse, MD     Brief Narrative / Hospital Course:  Beth Carroll is a 52 y.o. female with medical history significant of multiple medical issues including peripheral artery disease, hyperglycemia with type 2 diabetes, GERD, HFpEF, coronary artery disease presenting to ED 07/05/2022 with right diabetic foot infection. Recent 1st and 2nd R toe amputations 06/03/2022 w/ Dr Wyn Quaker. Seen in follow up 05/31 in vascular office, some drainage and bleeding / serous discharge, advised elevation of limb and also Rx abx. Seen at wound care 06/03 and 06/10. On 06/10, pt reporting weakness/fatigue and noted to have low BP in wound clinic. was sent to ED.  06/10: Possible osteomyelitis. Admitted to hospitalist service.  06/11: Vascular recs for podiatry eval, podiatry plans for I&D but need to be off Plavix and Eliquis so plan for 06/14.  06/12: BCx NGx2d. Wound Cx (+)GPC 06/13: Wound Cx mod staph aureus and few pseudomonas, pend suscept 06/14: abx to cipro. I&D infected soft tissue and bone right forefoot.  today.  06/15: PT/OT recs pending. Surgical path pending. Restart Eliquis + Plavix 06/16: PT recs for HH. Surgical path still pending. Dressing change tomorrow.   Consultants:  Vascular surgery  Podiatry   Procedures: 07/09/2022 I&D infected soft tissue and bone right forefoot       ASSESSMENT & PLAN:   Principal Problem:   Diabetic foot infection (HCC) Active Problems:   Hyperglycemia due to type 2 diabetes mellitus (HCC)   Chronic systolic CHF (congestive heart failure) (HCC)   Essential hypertension   PAD (peripheral artery disease) (HCC)   CAD S/P percutaneous coronary angioplasty   GERD without esophagitis   Dyslipidemia   COPD (chronic obstructive pulmonary disease) (HCC)   Atrial fibrillation (HCC)   Malnutrition of moderate degree   Diabetic foot  infection (HCC) Cx(+) mod staph aureus and few pseudomonas 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 Wyn Quaker S/p I&D infected soft tissue and bone right forefoot 07/09/2022 Abx transitioned to po cipro but if pathology doesn't show clear margins may need further surgery / IV abx.  Blood cultures NGx2d Vascular surgery and podiatry teams following  Surgical path pending  Chronic systolic CHF (congestive heart failure) (HCC) 2D echo February 2024 with EF of 50 to 55% Appears euvolemic Monitor volume status closely  Anemia Monitor CBC Transfuse as needed / Hgb <7 or Hgb <8 w/ complications - pt has verbally consented to transfusion if needed   CAD S/P percutaneous coronary angioplasty No active chest pain Continue home ASA, atorvastatin   COPD (chronic obstructive pulmonary disease) (HCC) Stable, no acute exacerbation  Continue home inhalers  Essential hypertension BP stable Titrate home regimen as needed  Added amlodipine for now, may consider ACE/ARB +/- diuretic on discharge   GERD without esophagitis PPI  Hyperglycemia due to type 2 diabetes mellitus (HCC) Blood sugars in 300s on presentation Major contributor to wound complications which was discussed with the patient Continue home long-acting insulin - increased dose to 35 units daily   Sliding-scale novolog meal times   PAD (peripheral artery disease) (HCC) Restart ASA/Plavix   Dyslipidemia Continue statin  Abnormal EKG Noted atrial fibrillation on EKG but substantial artifact, difficult to assess P waves, suspect actually NSR  Had stable ECHO 02/2022  Cardiology consult here if decompensates, vs outpatient f/u as clinically indicated.  Telemetry for now appears  sinus rhythm     DVT prophylaxis: Eliquis  Pertinent IV fluids/nutrition: d/c continuous IV fluids  Central lines / invasive devices: none  Code Status: FULL CODE ACP documentation reviewed: 07/06/22 none on file  [name]   Current Admission Status: inpatient   TOC needs / Dispo plan: HH Barriers to discharge / significant pending items: pending path results from surgery to determine abx plan              Subjective / Brief ROS:  Pt doing well post op Pain controlled Working w/ PT/OT   Family Communication: support persons at bedside on rounds     Objective Findings:  Vitals:   07/10/22 0802 07/10/22 1546 07/11/22 0051 07/11/22 0802  BP: (!) 151/84 (!) 100/58 (!) 140/71 131/70  Pulse: 87 74 82 79  Resp: 17 17 18 17   Temp: 98.5 F (36.9 C) 98.1 F (36.7 C) 98.2 F (36.8 C) 98 F (36.7 C)  TempSrc:    Oral  SpO2: 100% 100% 100% 100%  Weight:      Height:        Intake/Output Summary (Last 24 hours) at 07/11/2022 1443 Last data filed at 07/11/2022 1407 Gross per 24 hour  Intake 240 ml  Output 1350 ml  Net -1110 ml   Filed Weights   07/05/22 1322 07/09/22 1533  Weight: 53.5 kg 53.5 kg    Examination:  Physical Exam Constitutional:      General: She is not in acute distress. Pulmonary:     Effort: Pulmonary effort is normal.     Breath sounds: Normal breath sounds.  Neurological:     Mental Status: She is alert. Mental status is at baseline.  Psychiatric:        Mood and Affect: Mood normal.        Behavior: Behavior normal.          Scheduled Medications:   amLODipine  10 mg Oral Daily   apixaban  5 mg Oral BID   vitamin C  500 mg Oral BID   aspirin EC  81 mg Oral Daily   atorvastatin  80 mg Oral Daily   clopidogrel  75 mg Oral Daily   feeding supplement  1 Container Oral TID BM   furosemide  20 mg Oral Daily   insulin aspart  0-15 Units Subcutaneous TID WC   insulin aspart  0-5 Units Subcutaneous QHS   insulin aspart  8 Units Subcutaneous TID WC   insulin glargine-yfgn  35 Units Subcutaneous Daily   multivitamin with minerals  1 tablet Oral Daily   pantoprazole  40 mg Oral Daily   tiotropium  1 capsule Inhalation Daily   zinc sulfate  220 mg  Oral Daily    Continuous Infusions:  ciprofloxacin 400 mg (07/11/22 0914)   metronidazole 500 mg (07/11/22 0510)   vancomycin 1,000 mg (07/11/22 1313)    PRN Medications:  acetaminophen **OR** acetaminophen, albuterol, diphenhydrAMINE, HYDROcodone-acetaminophen, morphine injection, ondansetron **OR** ondansetron (ZOFRAN) IV  Antimicrobials from admission:  Anti-infectives (From admission, onward)    Start     Dose/Rate Route Frequency Ordered Stop   07/09/22 1200  vancomycin (VANCOCIN) IVPB 1000 mg/200 mL premix        1,000 mg 200 mL/hr over 60 Minutes Intravenous Every 12 hours 07/09/22 1049     07/09/22 1000  ciprofloxacin (CIPRO) IVPB 400 mg        400 mg 200 mL/hr over 60 Minutes Intravenous Every 12 hours 07/09/22 0903  07/06/22 1000  vancomycin (VANCOREADY) IVPB 1250 mg/250 mL  Status:  Discontinued        1,250 mg 166.7 mL/hr over 90 Minutes Intravenous Every 24 hours 07/05/22 1922 07/09/22 1048   07/05/22 2200  ceFEPIme (MAXIPIME) 2 g in sodium chloride 0.9 % 100 mL IVPB  Status:  Discontinued        2 g 200 mL/hr over 30 Minutes Intravenous Every 8 hours 07/05/22 1922 07/09/22 0902   07/05/22 1745  metroNIDAZOLE (FLAGYL) IVPB 500 mg        500 mg 100 mL/hr over 60 Minutes Intravenous Every 12 hours 07/05/22 1731 07/12/22 1744   07/05/22 1615  vancomycin (VANCOCIN) IVPB 1000 mg/200 mL premix        1,000 mg 200 mL/hr over 60 Minutes Intravenous  Once 07/05/22 1609 07/05/22 1822   07/05/22 1615  piperacillin-tazobactam (ZOSYN) IVPB 3.375 g        3.375 g 100 mL/hr over 30 Minutes Intravenous  Once 07/05/22 1609 07/05/22 1721           Data Reviewed:  I have personally reviewed the following...  CBC: Recent Labs  Lab 07/05/22 1338 07/06/22 0346 07/07/22 0351 07/08/22 0357 07/09/22 0317 07/10/22 0410 07/11/22 0520  WBC 5.6 5.5 4.5 4.5  --  6.3  --   NEUTROABS 4.2  --   --   --   --   --   --   HGB 9.3* 8.5* 7.6* 7.6* 8.2* 7.7* 7.5*  HCT 32.7*  28.2* 26.2* 26.4* 28.1* 26.5* 26.4*  MCV 80.5 77.7* 79.6* 79.5*  --  80.3  --   PLT 281 250 222 214  --  224  --    Basic Metabolic Panel: Recent Labs  Lab 07/05/22 1338 07/06/22 0346 07/07/22 0351 07/08/22 0357 07/09/22 0317 07/10/22 0410  NA 134* 136 136 136  --  135  K 3.8 3.4* 3.4* 3.5  --  3.5  CL 99 104 105 107  --  104  CO2 27 26 27 24   --  24  GLUCOSE 308* 341* 395* 321*  --  486*  BUN 21* 16 16 24*  --  19  CREATININE 0.54 0.38* 0.47 0.53 0.51 0.59  CALCIUM 9.2 8.6* 8.1* 8.6*  --  8.3*   GFR: Estimated Creatinine Clearance: 69.5 mL/min (by C-G formula based on SCr of 0.59 mg/dL). Liver Function Tests: Recent Labs  Lab 07/05/22 1338 07/06/22 0346  AST 21 13*  ALT 16 12  ALKPHOS 104 88  BILITOT 0.7 0.6  PROT 7.4 6.6  ALBUMIN 3.6 3.1*   No results for input(s): "LIPASE", "AMYLASE" in the last 168 hours. No results for input(s): "AMMONIA" in the last 168 hours. Coagulation Profile: No results for input(s): "INR", "PROTIME" in the last 168 hours. Cardiac Enzymes: No results for input(s): "CKTOTAL", "CKMB", "CKMBINDEX", "TROPONINI" in the last 168 hours. BNP (last 3 results) No results for input(s): "PROBNP" in the last 8760 hours. HbA1C: No results for input(s): "HGBA1C" in the last 72 hours. CBG: Recent Labs  Lab 07/10/22 1229 07/10/22 1630 07/10/22 2213 07/11/22 0853 07/11/22 1125  GLUCAP 142* 160* 193* 214* 169*   Lipid Profile: No results for input(s): "CHOL", "HDL", "LDLCALC", "TRIG", "CHOLHDL", "LDLDIRECT" in the last 72 hours. Thyroid Function Tests: No results for input(s): "TSH", "T4TOTAL", "FREET4", "T3FREE", "THYROIDAB" in the last 72 hours. Anemia Panel: No results for input(s): "VITAMINB12", "FOLATE", "FERRITIN", "TIBC", "IRON", "RETICCTPCT" in the last 72 hours. Most Recent Urinalysis On File:  Component Value Date/Time   COLORURINE YELLOW (A) 06/01/2022 1800   APPEARANCEUR HAZY (A) 06/01/2022 1800   APPEARANCEUR Clear 08/30/2012  0047   LABSPEC 1.008 06/01/2022 1800   LABSPEC 1.014 08/30/2012 0047   PHURINE 7.0 06/01/2022 1800   GLUCOSEU >=500 (A) 06/01/2022 1800   GLUCOSEU >=500 08/30/2012 0047   HGBUR MODERATE (A) 06/01/2022 1800   BILIRUBINUR NEGATIVE 06/01/2022 1800   BILIRUBINUR Negative 08/30/2012 0047   KETONESUR NEGATIVE 06/01/2022 1800   PROTEINUR NEGATIVE 06/01/2022 1800   NITRITE NEGATIVE 06/01/2022 1800   LEUKOCYTESUR LARGE (A) 06/01/2022 1800   LEUKOCYTESUR 1+ 08/30/2012 0047   Sepsis Labs: @LABRCNTIP (procalcitonin:4,lacticidven:4) Microbiology: Recent Results (from the past 240 hour(s))  Blood culture (routine x 2)     Status: None   Collection Time: 07/05/22  2:04 PM   Specimen: BLOOD  Result Value Ref Range Status   Specimen Description BLOOD BLOOD RIGHT FOREARM  Final   Special Requests   Final    BOTTLES DRAWN AEROBIC AND ANAEROBIC Blood Culture adequate volume   Culture   Final    NO GROWTH 5 DAYS Performed at [facility], [address] [place], [place] [address]    Report Status 07/10/2022 FINAL  Final  Blood culture (routine x 2)     Status: None   Collection Time: 07/05/22  2:04 PM   Specimen: BLOOD  Result Value Ref Range Status   Specimen Description BLOOD BLOOD RIGHT ARM  Final   Special Requests   Final    BOTTLES DRAWN AEROBIC AND ANAEROBIC Blood Culture results may not be optimal due to an excessive volume of blood received in culture bottles   Culture   Final    NO GROWTH 5 DAYS Performed at [facility], [address], [place], [place] [address]    Report Status 07/10/2022 FINAL  Final  Aerobic/Anaerobic Culture w Gram Stain (surgical/deep wound)     Status: None (Preliminary result)   Collection Time: 07/05/22  3:40 PM   Specimen: Foot  Result Value Ref Range Status   Specimen Description   Final    FOOT Performed at [facility], [address], [place], [place] [address]    Special Requests   Final    RF Performed at  [facility], [address] [place], [place] [address]    Gram Stain   Final    RARE WBC PRESENT, PREDOMINANTLY MONONUCLEAR RARE GRAM POSITIVE COCCI IN SINGLES Performed at [facility], [address] [place], [place] [address]    Culture   Final    MODERATE METHICILLIN RESISTANT STAPHYLOCOCCUS AUREUS FEW PSEUDOMONAS AERUGINOSA NO ANAEROBES ISOLATED; CULTURE IN PROGRESS FOR 5 DAYS    Report Status PENDING  Incomplete   Organism ID, Bacteria METHICILLIN RESISTANT STAPHYLOCOCCUS AUREUS  Final   Organism ID, Bacteria PSEUDOMONAS AERUGINOSA  Final      Susceptibility   Methicillin resistant staphylococcus aureus - MIC*    CIPROFLOXACIN >=8 RESISTANT Resistant     ERYTHROMYCIN >=8 RESISTANT Resistant     GENTAMICIN <=0.5 SENSITIVE Sensitive     OXACILLIN >=4 RESISTANT Resistant     TETRACYCLINE <=1 SENSITIVE Sensitive     VANCOMYCIN <=0.5 SENSITIVE Sensitive     TRIMETH/SULFA >=320 RESISTANT Resistant     CLINDAMYCIN <=0.25 SENSITIVE Sensitive     RIFAMPIN <=0.5 SENSITIVE Sensitive     Inducible Clindamycin NEGATIVE Sensitive     LINEZOLID 2 SENSITIVE Sensitive     * MODERATE METHICILLIN RESISTANT STAPHYLOCOCCUS  AUREUS   Pseudomonas aeruginosa - MIC*    CEFTAZIDIME >=64 RESISTANT Resistant     CIPROFLOXACIN <=0.25 SENSITIVE Sensitive     GENTAMICIN <=1 SENSITIVE Sensitive     IMIPENEM 1 SENSITIVE Sensitive     PIP/TAZO >=128 RESISTANT Resistant     * FEW PSEUDOMONAS AERUGINOSA  Aerobic/Anaerobic Culture w Gram Stain (surgical/deep wound)     Status: None (Preliminary result)   Collection Time: 07/09/22  4:45 PM   Specimen: Foot, Left; Tissue  Result Value Ref Range Status   Specimen Description   Final    WOUND Performed at [facility], [address] [place], [place] [address]    Special Requests LEFT FOOT  Final   Gram Stain   Final    NO WBC SEEN NO ORGANISMS SEEN Performed at [facility], [address], [place], [place]  [address]    Culture   Final    FEW METHICILLIN RESISTANT STAPHYLOCOCCUS AUREUS NO ANAEROBES ISOLATED; CULTURE IN PROGRESS FOR 5 DAYS    Report Status PENDING  Incomplete   Organism ID, Bacteria METHICILLIN RESISTANT STAPHYLOCOCCUS AUREUS  Final      Susceptibility   Methicillin resistant staphylococcus aureus - MIC*    CIPROFLOXACIN >=8 RESISTANT Resistant     ERYTHROMYCIN >=8 RESISTANT Resistant     GENTAMICIN <=0.5 SENSITIVE Sensitive     OXACILLIN >=4 RESISTANT Resistant     TETRACYCLINE <=1 SENSITIVE Sensitive     VANCOMYCIN 1 SENSITIVE Sensitive     TRIMETH/SULFA >=320 RESISTANT Resistant     CLINDAMYCIN <=0.25 SENSITIVE Sensitive     RIFAMPIN <=0.5 SENSITIVE Sensitive     Inducible Clindamycin NEGATIVE Sensitive     LINEZOLID 2 SENSITIVE Sensitive     * FEW METHICILLIN RESISTANT STAPHYLOCOCCUS AUREUS      Radiology Studies last 3 days: No results found.           LOS: 6 days        Sunnie Nielsen, DO [facility] 07/11/2022, 2:43 PM    Dictation software may have been used to generate the above note. Typos may occur and escape review in typed/dictated notes. Please contact Dr Lyn Hollingshead directly for clarity if needed.  Staff may message me via secure chat in Epic  but this may not receive an immediate response,  please page me for urgent matters!  If 7PM-7AM, please contact night coverage [email or web address]

## 2022-07-12 ENCOUNTER — Encounter: Payer: Self-pay | Admitting: Podiatry

## 2022-07-12 DIAGNOSIS — E1151 Type 2 diabetes mellitus with diabetic peripheral angiopathy without gangrene: Secondary | ICD-10-CM | POA: Diagnosis not present

## 2022-07-12 DIAGNOSIS — E11628 Type 2 diabetes mellitus with other skin complications: Secondary | ICD-10-CM | POA: Diagnosis not present

## 2022-07-12 DIAGNOSIS — L089 Local infection of the skin and subcutaneous tissue, unspecified: Secondary | ICD-10-CM | POA: Diagnosis not present

## 2022-07-12 DIAGNOSIS — D649 Anemia, unspecified: Secondary | ICD-10-CM | POA: Diagnosis not present

## 2022-07-12 DIAGNOSIS — E1165 Type 2 diabetes mellitus with hyperglycemia: Secondary | ICD-10-CM | POA: Diagnosis not present

## 2022-07-12 DIAGNOSIS — I959 Hypotension, unspecified: Secondary | ICD-10-CM

## 2022-07-12 LAB — IRON AND TIBC
Iron: 18 ug/dL — ABNORMAL LOW (ref 28–170)
Saturation Ratios: 6 % — ABNORMAL LOW (ref 10.4–31.8)
TIBC: 316 ug/dL (ref 250–450)
UIBC: 298 ug/dL

## 2022-07-12 LAB — CREATININE, SERUM
Creatinine, Ser: 0.39 mg/dL — ABNORMAL LOW (ref 0.44–1.00)
GFR, Estimated: >60 mL/min (ref >60)

## 2022-07-12 LAB — GLUCOSE, CAPILLARY
Glucose-Capillary: 189 mg/dL — ABNORMAL HIGH (ref 70–99)
Glucose-Capillary: 123 mg/dL — ABNORMAL HIGH (ref 70–99)
Glucose-Capillary: 136 mg/dL — ABNORMAL HIGH (ref 70–99)
Glucose-Capillary: 121 mg/dL — ABNORMAL HIGH (ref 70–99)

## 2022-07-12 NOTE — Consult Note (Signed)
Pharmacy Antibiotic Note  Beth Carroll is a 52 y.o. female admitted on 07/05/2022 with  hypotension and concern for diabetic foot infection . Patient recently had right big toe amputation and was placed on doxycycline for prophylactic infectious coverage. Was seen by the podiatrist today with noted lower limit of normal blood pressures. And was redirected to the ER for further evaluation. Pharmacy has been consulted for Vancomycin and Cefepime dosing.  Plan: Continue vancomycin 1 g IV every 12 hours Vancomycin peak 6/13 @ 1242 = 22 ug/ml Vancomycin trough 6/14 @ 0926 =  6 ug/ml AUC = 305, half-life = 11.1 hours on vancomycin 1250 mg IV every 24 hours Estimated AUC with new dose: 489.2, Cmin 14.1  Ciprofloxacin 400 mg IV every 12 hours  Pharmacy will follow renal function and levels for adjustments   Height: 5\' 6"  (167.6 cm) Weight: 53.5 kg (118 lb) IBW/kg (Calculated) : 59.3  Temp (24hrs), Avg:98.7 F (37.1 C), Min:98.2 F (36.8 C), Max:99 F (37.2 C)  Recent Labs  Lab 07/05/22 1903 07/06/22 0346 07/06/22 0346 07/07/22 0351 07/08/22 0357 07/08/22 1242 07/09/22 0317 07/09/22 0926 07/10/22 0410 07/12/22 0425  WBC  --  5.5  --  4.5 4.5  --   --   --  6.3  --   CREATININE  --  0.38*   < > 0.47 0.53  --  0.51  --  0.59 0.39*  LATICACIDVEN 1.4  --   --   --   --   --   --   --   --   --   VANCOTROUGH  --   --   --   --   --   --   --  6*  --   --   VANCOPEAK  --   --   --   --   --  22*  --   --   --   --    < > = values in this interval not displayed.     Estimated Creatinine Clearance: 69.5 mL/min (A) (by C-G formula based on SCr of 0.39 mg/dL (L)).    No Known Allergies  Antimicrobials this admission: Vancomycin 6/10 >>  Cefepime 6/10 >> 6/14 Cipro 6/14>> Flagyl 6/10 >> 6/17 Zosyn 6/10 x 1  Dose adjustments this admission: N/A  Microbiology results: 6/10 BCx: ngtd 6/10 Wound cx: MRSA (S = Doxy, Vanco, Clinda, linezolid) and Pseudomonas aeruginosa (S = cipro,  gent, meropenem) 6/14 Surgical culture: MRSA (S = Doxy, Vanco, Clinda, linezolid)  Thank you for allowing pharmacy to be a part of this patient's care.  Barrie Folk, PharmD 07/12/2022 1:43 PM

## 2022-07-12 NOTE — Progress Notes (Signed)
Occupational Therapy Treatment Patient Details Name: Beth Carroll MRN: [id number] DOB: [birth year] Today's Date: 07/12/2022   History of present illness Pt is a 52 y.o. female presenting to hospital from wound care clinic with hypotension.  Pt admitted with diabetic foot infection, hyperglycemia, and atrial fibrillation.  S/p I&D infected soft tissue and bone R forefoot d/t osteomyelitis R foot.  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   OT comments  Pt seen for OT tx. Pt instructed in new offloading shoes, still requiring MAX A to don as she is unable to position foot well enough to gain access and adjust/set the velcro straps on the shoe. Pt verbalized understanding and endorsed she would feel confident in instructing her family in [place]. Pt seated EOB and instructed in BLE hip abduction ex x10, knee flexion/ext x10 each, SLR with hip abd/add while in bed. Pt also educated in pressure relief positioning for sacrum. Pt endorsed her sacrum/buttocks felt itchy and sore "like a sunburn". OT noted significant redness of skin and areas of irritation and mild bleeding. RN notified promptly. Pt returned to bed and remained sidelying to help provide pressure relief to her back/buttocks while waiting on the RN. Pt progressing towards goals, continues to benefit from skilled OT.    Recommendations for follow up therapy are one component of a multi-disciplinary discharge planning process, led by the attending physician.  Recommendations may be updated based on patient status, additional functional criteria and insurance authorization.    Assistance Recommended at Discharge Frequent or constant Supervision/Assistance  Patient can return home with the following  A little help with walking and/or transfers;A lot of help  with bathing/dressing/bathroom;Assistance with cooking/housework;Assist for transportation;Help with stairs or ramp for entrance   Equipment Recommendations  Other (comment) (defer to [facility] d/t unique tub set up)    Recommendations for Other Services      Precautions / Restrictions Precautions Precautions: Fall Precaution Comments: Pt reports using sliding board at baseline over the last several months d/t bilat heel wounds, though she reports wound center gave her the ok to start bearing some weight into L foot Restrictions Weight Bearing Restrictions: Yes RLE Weight Bearing: Non weight bearing Other Position/Activity Restrictions: Pt has to wear post-op shoe, can only PWB through heel       Mobility Bed Mobility Overal bed mobility: Needs Assistance Bed Mobility: Supine to Sit, Sit to Supine     Supine to sit: Supervision, HOB elevated Sit to supine: Supervision   General bed mobility comments: increased time/effort    Transfers                   General transfer comment: deferred 2/2 sacral skin integrity issues     Balance Overall balance assessment: Needs assistance Sitting-balance support: No upper extremity supported, Feet unsupported Sitting balance-Leahy Scale: Good                                     ADL either performed or assessed with clinical judgement   ADL Overall ADL's : Needs assistance/impaired  Extremity/Trunk Assessment              Vision       Perception     Praxis      Cognition Arousal/Alertness: Awake/alert Behavior During Therapy: WFL for tasks assessed/performed Overall Cognitive Status: Within Functional Limits for tasks assessed                                          Exercises Other Exercises Other Exercises: hip abduction x10, knee flexion/ext x10 each, instructed in SLR with hip abd/add while in bed; educated in  pressure relief positioning for sacrum Other Exercises: pt instructed in offloading post op shoe, still requiring MAX A to don    Shoulder Instructions       General Comments low back/sacrum/buttocks red and skin appears very irritated. Pt notes it's itchy and sore, "like a sunburn". Sacral foam pad loose. RN notified promptly to assess.    Pertinent Vitals/ Pain       Pain Assessment Pain Assessment: 0-10 Pain Score: 6  Pain Location: R foot Pain Descriptors / Indicators: Aching Pain Intervention(s): Limited activity within patient's tolerance, Monitored during session, Premedicated before session, Repositioned  Home Living                                          Prior Functioning/Environment              Frequency  Min 1X/week        Progress Toward Goals  OT Goals(current goals can now be found in the care plan section)  Progress towards OT goals: Progressing toward goals  Acute Rehab OT Goals Patient Stated Goal: go home with family support and restart HH services OT Goal Formulation: With patient Time For Goal Achievement: 07/24/22 Potential to Achieve Goals: Good  Plan Discharge plan remains appropriate;Frequency remains appropriate    Co-evaluation                 AM-PAC OT "6 Clicks" Daily Activity     Outcome Measure   Help from another person eating meals?: None Help from another person taking care of personal grooming?: A Little Help from another person toileting, which includes using toliet, bedpan, or urinal?: A Lot Help from another person bathing (including washing, rinsing, drying)?: A Little Help from another person to put on and taking off regular upper body clothing?: None Help from another person to put on and taking off regular lower body clothing?: A Lot 6 Click Score: 18    End of Session    OT Visit Diagnosis: Muscle weakness (generalized) (M62.81);Pain;Other abnormalities of gait and mobility  (R26.89) Pain - Right/Left: Right Pain - part of body: Leg   Activity Tolerance Patient tolerated treatment well   Patient Left in bed;with call bell/phone within reach;with bed alarm set   Nurse Communication Mobility status;Other (comment) (sacral/buttocks area red)        Time: 2841-3244 OT Time Calculation (min): 32 min  Charges: OT General Charges $OT Visit: 1 Visit OT Treatments $Self Care/Home Management : 8-22 mins $Therapeutic Exercise: 8-22 mins  Arman Filter., MPH, MS, OTR/L ascom [phone] 07/12/22, 4:44 PM

## 2022-07-12 NOTE — Progress Notes (Signed)
PROGRESS NOTE    Beth Carroll   AVW:[id number] DOB: Jul 13, 1970  DOA: 07/05/2022 Date of Service: 07/12/22 PCP: Gracelyn Nurse, MD     Brief Narrative / Hospital Course:  Beth Carroll is a 52 y.o. female with medical history significant of multiple medical issues including peripheral artery disease, hyperglycemia with type 2 diabetes, GERD, HFpEF, coronary artery disease presenting to ED 07/05/2022 with right diabetic foot infection. Recent 1st and 2nd R toe amputations 06/03/2022 w/ Dr Wyn Quaker. Seen in follow up 05/31 in vascular office, some drainage and bleeding / serous discharge, advised elevation of limb and also Rx abx. Seen at wound care 06/03 and 06/10. On 06/10, pt reporting weakness/fatigue and noted to have low BP in wound clinic. was sent to ED.  06/10: Possible osteomyelitis. Admitted to hospitalist service.  06/11: Vascular recs for podiatry eval, podiatry plans for I&D but need to be off Plavix and Eliquis so plan for 06/14.  06/12: BCx NGx2d. Wound Cx (+)GPC 06/13: Wound Cx mod staph aureus and few pseudomonas, pend suscept 06/14: abx to cipro. I&D infected soft tissue and bone right forefoot.  today.  06/15: PT/OT recs pending. Surgical path pending. Restart Eliquis + Plavix 06/16: PT recs for HH. Dressing change tomorrow.  06/17: Surgical path still pending. Dr Alberteen Spindle notes good healing and recs for ID consult.   Consultants:  Vascular surgery  Podiatry   Procedures: 07/09/2022 I&D infected soft tissue and bone right forefoot       ASSESSMENT & PLAN:   Principal Problem:   Diabetic foot infection (HCC) Active Problems:   Hyperglycemia due to type 2 diabetes mellitus (HCC)   Chronic systolic CHF (congestive heart failure) (HCC)   Essential hypertension   PAD (peripheral artery disease) (HCC)   CAD S/P percutaneous coronary angioplasty   GERD without esophagitis   Dyslipidemia   COPD (chronic obstructive pulmonary disease) (HCC)   Atrial fibrillation  (HCC)   Malnutrition of moderate degree   Diabetic foot infection (HCC) Cx(+) mod staph aureus and few pseudomonas 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 Wyn Quaker S/p I&D infected soft tissue and bone right forefoot 07/09/2022 if pathology doesn't show clear margins may need further surgery / IV abx.  Blood cultures NGx2d+ Vascular surgery and podiatry teams following  Surgical path pending ID consult today   Chronic systolic CHF (congestive heart failure) (HCC) 2D echo February 2024 with EF of 50 to 55% Appears euvolemic Monitor volume status closely  Anemia Monitor CBC Transfuse as needed / Hgb <7 or Hgb <8 w/ complications - pt has verbally consented to transfusion if needed   CAD S/P percutaneous coronary angioplasty No active chest pain Continue home ASA, atorvastatin   COPD (chronic obstructive pulmonary disease) (HCC) Stable, no acute exacerbation  Continue home inhalers  Essential hypertension BP stable Titrate home regimen as needed  Added amlodipine for now, may consider ACE/ARB +/- diuretic on discharge   GERD without esophagitis PPI  Hyperglycemia due to type 2 diabetes mellitus (HCC) Blood sugars in 300s on presentation Major contributor to wound complications which was discussed with the patient Continue home long-acting insulin - increased dose to 35 units daily   Sliding-scale novolog meal times   PAD (peripheral artery disease) (HCC) Restart ASA/Plavix   Dyslipidemia Continue statin  Abnormal EKG Noted atrial fibrillation on EKG but substantial artifact, difficult to assess P waves, suspect actually NSR  Had stable ECHO 02/2022  Cardiology consult here if decompensates, vs  outpatient f/u as clinically indicated.  Telemetry for now appears sinus rhythm     DVT prophylaxis: Eliquis  Pertinent IV fluids/nutrition: d/c continuous IV fluids  Central lines / invasive devices: none  Code Status: FULL CODE ACP  documentation reviewed: 07/06/22 none on file [name]   Current Admission Status: inpatient   TOC needs / Dispo plan: HH Barriers to discharge / significant pending items: pending path results from surgery to determine abx plan              Subjective / Brief ROS:  Pt doing well post op Pain controlled Working w/ PT/OT No fever/chills    Family Communication: none at this time     Objective Findings:  Vitals:   07/11/22 1601 07/12/22 0013 07/12/22 0723 07/12/22 0859  BP: 135/76 112/65 118/70 119/68  Pulse: 84 85 82 80  Resp: 17 16 16 19   Temp: 98.2 F (36.8 C) 99 F (37.2 C) 98.7 F (37.1 C) 98.9 F (37.2 C)  TempSrc:  Oral Axillary   SpO2: 100% 97% 98% 100%  Weight:      Height:        Intake/Output Summary (Last 24 hours) at 07/12/2022 1323 Last data filed at 07/12/2022 0900 Gross per 24 hour  Intake 2946.84 ml  Output 2400 ml  Net 546.84 ml   Filed Weights   07/05/22 1322 07/09/22 1533  Weight: 53.5 kg 53.5 kg    Examination:  Physical Exam Constitutional:      General: She is not in acute distress. Pulmonary:     Effort: Pulmonary effort is normal.     Breath sounds: Normal breath sounds.  Neurological:     Mental Status: She is alert. Mental status is at baseline.  Psychiatric:        Mood and Affect: Mood normal.        Behavior: Behavior normal.          Scheduled Medications:   amLODipine  10 mg Oral Daily   apixaban  5 mg Oral BID   vitamin C  500 mg Oral BID   aspirin EC  81 mg Oral Daily   atorvastatin  80 mg Oral Daily   clopidogrel  75 mg Oral Daily   feeding supplement  1 Container Oral TID BM   furosemide  20 mg Oral Daily   insulin aspart  0-15 Units Subcutaneous TID WC   insulin aspart  0-5 Units Subcutaneous QHS   insulin aspart  8 Units Subcutaneous TID WC   insulin glargine-yfgn  35 Units Subcutaneous Daily   multivitamin with minerals  1 tablet Oral Daily   pantoprazole  40 mg Oral Daily   tiotropium  1  capsule Inhalation Daily   zinc sulfate  220 mg Oral Daily    Continuous Infusions:  ciprofloxacin 400 mg (07/12/22 0942)   metronidazole 500 mg (07/12/22 1610)   vancomycin 1,000 mg (07/12/22 0015)    PRN Medications:  acetaminophen **OR** acetaminophen, albuterol, dextrose, diphenhydrAMINE, HYDROcodone-acetaminophen, morphine injection, ondansetron **OR** ondansetron (ZOFRAN) IV  Antimicrobials from admission:  Anti-infectives (From admission, onward)    Start     Dose/Rate Route Frequency Ordered Stop   07/09/22 1200  vancomycin (VANCOCIN) IVPB 1000 mg/200 mL premix        1,000 mg 200 mL/hr over 60 Minutes Intravenous Every 12 hours 07/09/22 1049     07/09/22 1000  ciprofloxacin (CIPRO) IVPB 400 mg        400 mg 200 mL/hr over 60  Minutes Intravenous Every 12 hours 07/09/22 0903     07/06/22 1000  vancomycin (VANCOREADY) IVPB 1250 mg/250 mL  Status:  Discontinued        1,250 mg 166.7 mL/hr over 90 Minutes Intravenous Every 24 hours 07/05/22 1922 07/09/22 1048   07/05/22 2200  ceFEPIme (MAXIPIME) 2 g in sodium chloride 0.9 % 100 mL IVPB  Status:  Discontinued        2 g 200 mL/hr over 30 Minutes Intravenous Every 8 hours 07/05/22 1922 07/09/22 0902   07/05/22 1745  metroNIDAZOLE (FLAGYL) IVPB 500 mg        500 mg 100 mL/hr over 60 Minutes Intravenous Every 12 hours 07/05/22 1731 07/12/22 1744   07/05/22 1615  vancomycin (VANCOCIN) IVPB 1000 mg/200 mL premix        1,000 mg 200 mL/hr over 60 Minutes Intravenous  Once 07/05/22 1609 07/05/22 1822   07/05/22 1615  piperacillin-tazobactam (ZOSYN) IVPB 3.375 g        3.375 g 100 mL/hr over 30 Minutes Intravenous  Once 07/05/22 1609 07/05/22 1721           Data Reviewed:  I have personally reviewed the following...  CBC: Recent Labs  Lab 07/05/22 1338 07/06/22 0346 07/07/22 0351 07/08/22 0357 07/09/22 0317 07/10/22 0410 07/11/22 0520  WBC 5.6 5.5 4.5 4.5  --  6.3  --   NEUTROABS 4.2  --   --   --   --   --   --    HGB 9.3* 8.5* 7.6* 7.6* 8.2* 7.7* 7.5*  HCT 32.7* 28.2* 26.2* 26.4* 28.1* 26.5* 26.4*  MCV 80.5 77.7* 79.6* 79.5*  --  80.3  --   PLT 281 250 222 214  --  224  --    Basic Metabolic Panel: Recent Labs  Lab 07/05/22 1338 07/06/22 0346 07/07/22 0351 07/08/22 0357 07/09/22 0317 07/10/22 0410 07/12/22 0425  NA 134* 136 136 136  --  135  --   K 3.8 3.4* 3.4* 3.5  --  3.5  --   CL 99 104 105 107  --  104  --   CO2 27 26 27 24   --  24  --   GLUCOSE 308* 341* 395* 321*  --  486*  --   BUN 21* 16 16 24*  --  19  --   CREATININE 0.54 0.38* 0.47 0.53 0.51 0.59 0.39*  CALCIUM 9.2 8.6* 8.1* 8.6*  --  8.3*  --    GFR: Estimated Creatinine Clearance: 69.5 mL/min (A) (by C-G formula based on SCr of 0.39 mg/dL (L)). Liver Function Tests: Recent Labs  Lab 07/05/22 1338 07/06/22 0346  AST 21 13*  ALT 16 12  ALKPHOS 104 88  BILITOT 0.7 0.6  PROT 7.4 6.6  ALBUMIN 3.6 3.1*   No results for input(s): "LIPASE", "AMYLASE" in the last 168 hours. No results for input(s): "AMMONIA" in the last 168 hours. Coagulation Profile: No results for input(s): "INR", "PROTIME" in the last 168 hours. Cardiac Enzymes: No results for input(s): "CKTOTAL", "CKMB", "CKMBINDEX", "TROPONINI" in the last 168 hours. BNP (last 3 results) No results for input(s): "PROBNP" in the last 8760 hours. HbA1C: No results for input(s): "HGBA1C" in the last 72 hours. CBG: Recent Labs  Lab 07/11/22 1640 07/11/22 1657 07/11/22 2146 07/12/22 0808 07/12/22 1213  GLUCAP 64* 88 217* 189* 123*   Lipid Profile: No results for input(s): "CHOL", "HDL", "LDLCALC", "TRIG", "CHOLHDL", "LDLDIRECT" in the last 72 hours. Thyroid Function Tests:  No results for input(s): "TSH", "T4TOTAL", "FREET4", "T3FREE", "THYROIDAB" in the last 72 hours. Anemia Panel: No results for input(s): "VITAMINB12", "FOLATE", "FERRITIN", "TIBC", "IRON", "RETICCTPCT" in the last 72 hours. Most Recent Urinalysis On File:     Component Value Date/Time    COLORURINE YELLOW (A) 06/01/2022 1800   APPEARANCEUR HAZY (A) 06/01/2022 1800   APPEARANCEUR Clear 08/30/2012 0047   LABSPEC 1.008 06/01/2022 1800   LABSPEC 1.014 08/30/2012 0047   PHURINE 7.0 06/01/2022 1800   GLUCOSEU >=500 (A) 06/01/2022 1800   GLUCOSEU >=500 08/30/2012 0047   HGBUR MODERATE (A) 06/01/2022 1800   BILIRUBINUR NEGATIVE 06/01/2022 1800   BILIRUBINUR Negative 08/30/2012 0047   KETONESUR NEGATIVE 06/01/2022 1800   PROTEINUR NEGATIVE 06/01/2022 1800   NITRITE NEGATIVE 06/01/2022 1800   LEUKOCYTESUR LARGE (A) 06/01/2022 1800   LEUKOCYTESUR 1+ 08/30/2012 0047   Sepsis Labs: @LABRCNTIP (procalcitonin:4,lacticidven:4) Microbiology: Recent Results (from the past 240 hour(s))  Blood culture (routine x 2)     Status: None   Collection Time: 07/05/22  2:04 PM   Specimen: BLOOD  Result Value Ref Range Status   Specimen Description BLOOD BLOOD RIGHT FOREARM  Final   Special Requests   Final    BOTTLES DRAWN AEROBIC AND ANAEROBIC Blood Culture adequate volume   Culture   Final    NO GROWTH 5 DAYS Performed at [facility], [address] [place], [place] [address]    Report Status 07/10/2022 FINAL  Final  Blood culture (routine x 2)     Status: None   Collection Time: 07/05/22  2:04 PM   Specimen: BLOOD  Result Value Ref Range Status   Specimen Description BLOOD BLOOD RIGHT ARM  Final   Special Requests   Final    BOTTLES DRAWN AEROBIC AND ANAEROBIC Blood Culture results may not be optimal due to an excessive volume of blood received in culture bottles   Culture   Final    NO GROWTH 5 DAYS Performed at [facility], [address] [place], [place] [address]    Report Status 07/10/2022 FINAL  Final  Aerobic/Anaerobic Culture w Gram Stain (surgical/deep wound)     Status: None   Collection Time: 07/05/22  3:40 PM   Specimen: Foot  Result Value Ref Range Status   Specimen Description   Final    FOOT Performed at [facility], [address] [place], [place] [address]    Special Requests   Final    RF Performed at [facility], [address] [place], [place] [address]    Gram Stain   Final    RARE WBC PRESENT, PREDOMINANTLY MONONUCLEAR RARE GRAM POSITIVE COCCI IN SINGLES    Culture   Final    MODERATE METHICILLIN RESISTANT STAPHYLOCOCCUS AUREUS FEW PSEUDOMONAS AERUGINOSA NO ANAEROBES ISOLATED Performed at [facility], [address] [place], [place] [address]    Report Status 07/11/2022 FINAL  Final   Organism ID, Bacteria METHICILLIN RESISTANT STAPHYLOCOCCUS AUREUS  Final   Organism ID, Bacteria PSEUDOMONAS AERUGINOSA  Final      Susceptibility   Methicillin resistant staphylococcus aureus - MIC*    CIPROFLOXACIN >=8 RESISTANT Resistant     ERYTHROMYCIN >=8 RESISTANT Resistant     GENTAMICIN <=0.5 SENSITIVE Sensitive     OXACILLIN >=4 RESISTANT Resistant     TETRACYCLINE <=1 SENSITIVE Sensitive     VANCOMYCIN <=0.5 SENSITIVE Sensitive     TRIMETH/SULFA >=320 RESISTANT Resistant     CLINDAMYCIN <=0.25 SENSITIVE Sensitive  RIFAMPIN <=0.5 SENSITIVE Sensitive     Inducible Clindamycin NEGATIVE Sensitive     LINEZOLID 2 SENSITIVE Sensitive     * MODERATE METHICILLIN RESISTANT STAPHYLOCOCCUS AUREUS   Pseudomonas aeruginosa - MIC*    CEFTAZIDIME >=64 RESISTANT Resistant     CIPROFLOXACIN <=0.25 SENSITIVE Sensitive     GENTAMICIN <=1 SENSITIVE Sensitive     IMIPENEM 1 SENSITIVE Sensitive     PIP/TAZO >=128 RESISTANT Resistant     * FEW PSEUDOMONAS AERUGINOSA  Aerobic/Anaerobic Culture w Gram Stain (surgical/deep wound)     Status: None (Preliminary result)   Collection Time: 07/09/22  4:45 PM   Specimen: Foot, Left; Tissue  Result Value Ref Range Status   Specimen Description   Final    WOUND Performed at [facility], [address] [place], [place] [address]    Special Requests LEFT FOOT  Final   Gram Stain   Final    NO WBC SEEN NO ORGANISMS SEEN Performed at  [facility], [address], [place], [place] [address]    Culture   Final    FEW METHICILLIN RESISTANT STAPHYLOCOCCUS AUREUS NO ANAEROBES ISOLATED; CULTURE IN PROGRESS FOR 5 DAYS    Report Status PENDING  Incomplete   Organism ID, Bacteria METHICILLIN RESISTANT STAPHYLOCOCCUS AUREUS  Final      Susceptibility   Methicillin resistant staphylococcus aureus - MIC*    CIPROFLOXACIN >=8 RESISTANT Resistant     ERYTHROMYCIN >=8 RESISTANT Resistant     GENTAMICIN <=0.5 SENSITIVE Sensitive     OXACILLIN >=4 RESISTANT Resistant     TETRACYCLINE <=1 SENSITIVE Sensitive     VANCOMYCIN 1 SENSITIVE Sensitive     TRIMETH/SULFA >=320 RESISTANT Resistant     CLINDAMYCIN <=0.25 SENSITIVE Sensitive     RIFAMPIN <=0.5 SENSITIVE Sensitive     Inducible Clindamycin NEGATIVE Sensitive     LINEZOLID 2 SENSITIVE Sensitive     * FEW METHICILLIN RESISTANT STAPHYLOCOCCUS AUREUS      Radiology Studies last 3 days: No results found.           LOS: 7 days        Sunnie Nielsen, DO [facility] 07/12/2022, 1:23 PM    Dictation software may have been used to generate the above note. Typos may occur and escape review in typed/dictated notes. Please contact Dr Lyn Hollingshead directly for clarity if needed.  Staff may message me via secure chat in Epic  but this may not receive an immediate response,  please page me for urgent matters!  If 7PM-7AM, please contact night coverage [email or web address]

## 2022-07-12 NOTE — TOC Progression Note (Signed)
Transition of Care [facility]) - Progression Note    Patient Details  Name: Bibiana Finan MRN: [id number] Date of Birth: 1971/01/10  [facility] CM/SW Contact  Garret Reddish, RN Phone Number: 07/12/2022, 3:15 PM  Clinical Narrative:   I have spoken with Mrs. Schoenherr.  She informs me that she was active with [facility] prior to admission. She would like to continue to use [facility] post discharge.    I have verified with [place] with [facility] that patient is an active patient.  [place] reports that [facility] provided patient with [facility] Services: Nursing, PT/OT and HHA.    Noted Surgical path continues to be pending.  TOC will continue to follow for discharge planning.        Expected Discharge Plan:  (Pending Medical work up) Barriers to Discharge: No Barriers Identified  Expected Discharge Plan and Services   Discharge Planning Services: CM Consult                                           Social Determinants of Health (SDOH) Interventions SDOH Screenings   Food Insecurity: No Food Insecurity (07/05/2022)  Housing: Low Risk  (07/05/2022)  Transportation Needs: No Transportation Needs (07/05/2022)  Utilities: Not At Risk (07/05/2022)  Depression (PHQ2-9): Low Risk  (04/13/2021)  Tobacco Use: Medium Risk (07/12/2022)    Readmission Risk Interventions     No data to display

## 2022-07-12 NOTE — Consult Note (Addendum)
NAME: Beth Carroll  DOB: 23-Jan-1971  MRN: [id number]  Date/Time: 07/12/2022 7:25 PM  REQUESTING PROVIDER: Dr.Alexander Subjective:  REASON FOR CONSULT: diabetic foot infection ? Beth Carroll is a 52 y.o. female with a history of DM, CAD, PAD, HFpEFamputation rt first and 2nd toe for gangrene May 2024, ORIF rt distal femur Nov 2023 Presented to the ED from wound clinic on 07/05/22 because of weakness, low BP of 50/40  Pt says she has been feeling weak and dizzy for a few days No fever or chills No pain rt foot No nausea or vomiting or diarrhea Says Bp meds were stopped long time ago In the Ed vitals  07/05/22 13:22  BP 151/84 !  Temp 97.7 F (36.5 C)  Pulse Rate 82  Resp 16  SpO2 99 %   Labs  Latest Reference Range & Units 07/05/22 13:38  WBC 4.0 - 10.5 K/uL 5.6  Hemoglobin 12.0 - 15.0 g/dL 9.3 (L)  HCT 40.9 - 81.1 % 32.7 (L)  Platelets 150 - 400 K/uL 281  Creatinine 0.44 - 1.00 mg/dL 9.14  Lactate 1.8 Procal < 0.10 MRI of the rt foot showed osteomyelitis of the hallux sesamoids Patchy marrow edema of the remaining bones of forefoot ( questioning demineralization, stress, reactive osteitis, possible bone infarction) Sed rate 48 CRP < 0.5 Pt on 04/24/22 underwent rt Percutaneous angioplast and thrombectomy of the rt PTA  Was set home of plavix, eliquis and ASA On 5/9 underwent amputation of 1st and 2nd toe for gangrene  Was followed at the wound clinic and was sent to ED on 6/10 for hypotnesion Was found to have some dehiscence of the surgical site  Culture sent from the wound was MRSA, pseudomonas and Seen by podiatrist And taken for amputation of 3/4/toes Pathology pending Surgical culture was MRSA ( there were no wbc or organisms seen on the gram stain) Pt is currently on cipro and vanco I am asked to see patient for antibiotic management  Past Medical History:  Diagnosis Date   Acute deep vein thrombosis (DVT) of right lower extremity (HCC)    ADHD (attention  deficit hyperactivity disorder)    AKI (acute kidney injury) (HCC) 04/30/2022   Aortic atherosclerosis (HCC)    COPD (chronic obstructive pulmonary disease) (HCC)    Coronary artery disease 12/15/2020   a.) LHC/PCI 12/15/2020: 99% mLAD (2.25 x 18 mm Onyx Frontier DES), 90% RI (2.75 x 22 mm Onyx Frontier DES), 40% m-dLAD   Diabetes mellitus without complication (HCC)    Diabetes mellitus, type 2 (HCC)    GERD (gastroesophageal reflux disease)    HFrEF (heart failure with reduced ejection fraction) (HCC)    a.) TTE 12/10/2020: EF 40-45%, apical/periapical HK, G2DD; b.) LHC 12/15/2020: EF 35-45%; c.) TTE 06/26/2021: EF 40-45%, LVH, G2DD; d.) TTE 03/21/2022: EF 55-60%, LV/apical sep/ant wall HK, Triv MR, AoV sclerosis   High anion gap metabolic acidosis 04/30/2022   Hyperlipemia    Hypertension    Hypophosphatemia 04/30/2022   Ischemic cardiomyopathy    a.) TTE 12/10/2020: EF 40-45%; b.) LHC 12/15/2020: EF 35-45%; c.) TTE 06/26/2021: EF 40-45%; d.) TTE 03/21/2022: EF 55-60%   Meningioma (HCC)    a.) CT head and MRI brain 08/21/2021: LEFT parietal meningioma   Myocardial infarction (HCC)    Nausea vomiting and diarrhea 04/30/2022   Orthostatic hypotension    PAD (peripheral artery disease) (HCC)    Sepsis secondary to UTI (HCC) 04/30/2022   Tobacco use     Past Surgical History:  Procedure Laterality Date   AMPUTATION Right 06/03/2022   Procedure: AMPUTATION RAY ( 1ST AND 2ND TOE);  Surgeon: Annice Needy, MD;  Location: [facility];  Service: Vascular;  Laterality: Right;   CHOLECYSTECTOMY     CORONARY STENT INTERVENTION N/A 12/15/2020   Procedure: CORONARY STENT INTERVENTION;  Surgeon: Iran Ouch, MD;  Location: [facility];  Service: Cardiovascular;  Laterality: N/A;   INCISION AND DRAINAGE OF WOUND Right 07/09/2022   Procedure: IRRIGATION AND DEBRIDEMENT WOUND;  Surgeon: Linus Galas, DPM;  Location: [facility];  Service: Podiatry;  Laterality: Right;   LEFT HEART CATH AND  CORONARY ANGIOGRAPHY N/A 12/15/2020   Procedure: LEFT HEART CATH AND CORONARY ANGIOGRAPHY;  Surgeon: Iran Ouch, MD;  Location: [facility];  Service: Cardiovascular;  Laterality: N/A;   LOWER EXTREMITY ANGIOGRAPHY Left 12/10/2020   Procedure: Lower Extremity Angiography;  Surgeon: Annice Needy, MD;  Location: [facility];  Service: Cardiovascular;  Laterality: Left;   LOWER EXTREMITY ANGIOGRAPHY Right 12/25/2020   Procedure: LOWER EXTREMITY ANGIOGRAPHY;  Surgeon: Annice Needy, MD;  Location: [facility];  Service: Cardiovascular;  Laterality: Right;   LOWER EXTREMITY ANGIOGRAPHY Right 08/19/2021   Procedure: Lower Extremity Angiography;  Surgeon: Annice Needy, MD;  Location: [facility];  Service: Cardiovascular;  Laterality: Right;   LOWER EXTREMITY ANGIOGRAPHY Right 08/19/2021   Procedure: Lower Extremity Angiography;  Surgeon: Annice Needy, MD;  Location: [facility];  Service: Cardiovascular;  Laterality: Right;   LOWER EXTREMITY ANGIOGRAPHY Right 04/24/2022   Procedure: Lower Extremity Angiography;  Surgeon: Learta Codding, MD;  Location: [facility];  Service: Cardiovascular;  Laterality: Right;   ORIF FEMUR FRACTURE Right 12/14/2021   Procedure: OPEN REDUCTION INTERNAL FIXATION RIGHT DISTAL FEMUR;  Surgeon: Roby Lofts, MD;  Location: [facility];  Service: Orthopedics;  Laterality: Right;    Social History   Socioeconomic History   Marital status: Single    Spouse name: Not on file   Number of children: Not on file   Years of education: Not on file   Highest education level: Not on file  Occupational History   Not on file  Tobacco Use   Smoking status: Former    Types: Cigarettes    Quit date: 12/09/2020    Years since quitting: 1.5   Smokeless tobacco: Never  Vaping Use   Vaping Use: Never used  Substance and Sexual Activity   Alcohol use: Never   Drug use: Not on file   Sexual activity: Not on file  Other  Topics Concern   Not on file  Social History Narrative   Not on file   Social Determinants of Health   Financial Resource Strain: Not on file  Food Insecurity: No Food Insecurity (07/05/2022)   Hunger Vital Sign    Worried About Running Out of Food in the Last Year: Never true    Ran Out of Food in the Last Year: Never true  Transportation Needs: No Transportation Needs (07/05/2022)   PRAPARE - Administrator, Civil Service (Medical): No    Lack of Transportation (Non-Medical): No  Physical Activity: Not on file  Stress: Not on file  Social Connections: Not on file  Intimate Partner Violence: Not At Risk (07/05/2022)   Humiliation, Afraid, Rape, and Kick questionnaire    Fear of Current or Ex-Partner: No    Emotionally Abused: No    Physically Abused: No  Sexually Abused: No    Family History  Problem Relation Age of Onset   Hypertension Father    No Known Allergies I? Current Facility-Administered Medications  Medication Dose Route Frequency Provider Last Rate Last Admin   acetaminophen (TYLENOL) tablet 650 mg  650 mg Oral Q6H PRN Linus Galas, DPM       Or   acetaminophen (TYLENOL) suppository 650 mg  650 mg Rectal Q6H PRN Linus Galas, DPM       albuterol (PROVENTIL) (2.5 MG/3ML) 0.083% nebulizer solution 3 mL  3 mL Inhalation Q6H PRN Linus Galas, DPM       amLODipine (NORVASC) tablet 10 mg  10 mg Oral Daily Linus Galas, DPM   10 mg at 07/12/22 1610   apixaban (ELIQUIS) tablet 5 mg  5 mg Oral BID Sunnie Nielsen, DO   5 mg at 07/12/22 9604   ascorbic acid (VITAMIN C) tablet 500 mg  500 mg Oral BID Linus Galas, DPM   500 mg at 07/12/22 5409   aspirin EC tablet 81 mg  81 mg Oral Daily Linus Galas, DPM   81 mg at 07/12/22 0943   atorvastatin (LIPITOR) tablet 80 mg  80 mg Oral Daily Linus Galas, DPM   80 mg at 07/12/22 0943   ciprofloxacin (CIPRO) IVPB 400 mg  400 mg Intravenous Q12H Linus Galas, DPM 200 mL/hr at 07/12/22 0942 400 mg at 07/12/22 0942    clopidogrel (PLAVIX) tablet 75 mg  75 mg Oral Daily Sunnie Nielsen, DO   75 mg at 07/12/22 0943   dextrose 50 % solution 50 mL  1 ampule Intravenous PRN Sunnie Nielsen, DO       diphenhydrAMINE (BENADRYL) capsule 25 mg  25 mg Oral Q6H PRN Linus Galas, DPM   25 mg at 07/12/22 8119   feeding supplement (BOOST / RESOURCE BREEZE) liquid 1 Container  1 Container Oral TID BM Linus Galas, DPM   1 Container at 07/09/22 2042   furosemide (LASIX) tablet 20 mg  20 mg Oral Daily Linus Galas, DPM   20 mg at 07/12/22 0943   HYDROcodone-acetaminophen (NORCO/VICODIN) 5-325 MG per tablet 1-2 tablet  1-2 tablet Oral Q4H PRN Linus Galas, DPM   2 tablet at 07/12/22 1410   insulin aspart (novoLOG) injection 0-15 Units  0-15 Units Subcutaneous TID WC Linus Galas, DPM   2 Units at 07/12/22 1258   insulin aspart (novoLOG) injection 0-5 Units  0-5 Units Subcutaneous QHS Linus Galas, DPM   2 Units at 07/11/22 2147   insulin aspart (novoLOG) injection 8 Units  8 Units Subcutaneous TID WC Linus Galas, DPM   8 Units at 07/12/22 1632   insulin glargine-yfgn (SEMGLEE) injection 35 Units  35 Units Subcutaneous Daily Linus Galas, DPM   35 Units at 07/12/22 0940   morphine (PF) 2 MG/ML injection 2 mg  2 mg Intravenous Q2H PRN Linus Galas, DPM       multivitamin with minerals tablet 1 tablet  1 tablet Oral Daily Linus Galas, DPM   1 tablet at 07/12/22 0942   ondansetron (ZOFRAN) tablet 4 mg  4 mg Oral Q6H PRN Linus Galas, DPM       Or   ondansetron [facility]) injection 4 mg  4 mg Intravenous Q6H PRN Linus Galas, DPM       pantoprazole (PROTONIX) EC tablet 40 mg  40 mg Oral Daily Linus Galas, DPM   40 mg at 07/12/22 0943   tiotropium [facility]) inhalation capsule ([facility] use ONLY) 18 mcg  1 capsule Inhalation Daily Linus Galas, DPM   18 mcg at 07/12/22 0944   vancomycin (VANCOCIN) IVPB 1000 mg/200 mL premix  1,000 mg Intravenous Q12H Linus Galas, DPM 200 mL/hr at 07/12/22 1300 1,000 mg at 07/12/22 1300   zinc sulfate capsule 220  mg  220 mg Oral Daily Linus Galas, DPM   220 mg at 07/12/22 0454     Abtx:  Anti-infectives (From admission, onward)    Start     Dose/Rate Route Frequency Ordered Stop   07/09/22 1200  vancomycin (VANCOCIN) IVPB 1000 mg/200 mL premix        1,000 mg 200 mL/hr over 60 Minutes Intravenous Every 12 hours 07/09/22 1049     07/09/22 1000  ciprofloxacin (CIPRO) IVPB 400 mg        400 mg 200 mL/hr over 60 Minutes Intravenous Every 12 hours 07/09/22 0903     07/06/22 1000  vancomycin (VANCOREADY) IVPB 1250 mg/250 mL  Status:  Discontinued        1,250 mg 166.7 mL/hr over 90 Minutes Intravenous Every 24 hours 07/05/22 1922 07/09/22 1048   07/05/22 2200  ceFEPIme (MAXIPIME) 2 g in sodium chloride 0.9 % 100 mL IVPB  Status:  Discontinued        2 g 200 mL/hr over 30 Minutes Intravenous Every 8 hours 07/05/22 1922 07/09/22 0902   07/05/22 1745  metroNIDAZOLE (FLAGYL) IVPB 500 mg        500 mg 100 mL/hr over 60 Minutes Intravenous Every 12 hours 07/05/22 1731 07/12/22 1744   07/05/22 1615  vancomycin (VANCOCIN) IVPB 1000 mg/200 mL premix        1,000 mg 200 mL/hr over 60 Minutes Intravenous  Once 07/05/22 1609 07/05/22 1822   07/05/22 1615  piperacillin-tazobactam (ZOSYN) IVPB 3.375 g        3.375 g 100 mL/hr over 30 Minutes Intravenous  Once 07/05/22 1609 07/05/22 1721       REVIEW OF SYSTEMS:  Const: negative fever, negative chills, negative weight loss Eyes: negative diplopia or visual changes, negative eye pain ENT: negative coryza, negative sore throat Resp: negative cough, hemoptysis, dyspnea Cards: negative for chest pain, palpitations, lower extremity edema GU: negative for frequency, dysuria and hematuria GI: Negative for abdominal pain, diarrhea, bleeding, constipation Skin: negative for rash and pruritus Heme: negative for easy bruising and gum/nose bleeding MS: negative for myalgias, arthralgias, back pain and muscle weakness Neurolo:negative for headaches, dizziness,  vertigo, memory problems  Psych: negative for feelings of anxiety, depression  Endocrine, diabetes Allergy/Immunology- negative for any medication or food allergies ?  Objective:  VITALS:  BP 119/68 (BP Location: Left Arm)   Pulse 80   Temp 98.9 F (37.2 C)   Resp 19   Ht 5\' 6"  (1.676 m)   Wt 53.5 kg   LMP 01/26/2015   SpO2 100%   BMI 19.05 kg/m  LDA Foley Central line Other drainage tubes PHYSICAL EXAM:  General: Alert, cooperative, no distress, appears stated age. pale Head: Normocephalic, without obvious abnormality, atraumatic. Eyes: Conjunctivae clear, anicteric sclerae. Pupils are equal ENT Nares normal. No drainage or sinus tenderness. Lips, mucosa, and tongue normal. No Thrush Neck: Supple, symmetrical, no adenopathy, thyroid: non tender no carotid bruit and no JVD. Back: No CVA tenderness. Lungs: Clear to auscultation bilaterally. No Wheezing or Rhonchi. No rales. Heart: Regular rate and rhythm, no murmur, rub or gallop. Abdomen: Soft, non-tender,not distended. Bowel sounds normal. No masses Extremities:  Left foot- varus deformity atraumatic, no cyanosis. No edema. No clubbing Skin: No rashes or lesions. Or bruising Lymph: Cervical, supraclavicular normal. Neurologic: Grossly non-focal Pertinent Labs Lab Results CBC    Component Value Date/Time   WBC 6.3 07/10/2022 0410   RBC 3.30 (L) 07/10/2022 0410   HGB 7.5 (L) 07/11/2022 0520   HCT 26.4 (L) 07/11/2022 0520   PLT 224 07/10/2022 0410   MCV 80.3 07/10/2022 0410   MCH 23.3 (L) 07/10/2022 0410   MCHC 29.1 (L) 07/10/2022 0410   RDW 15.3 07/10/2022 0410   LYMPHSABS 1.0 07/05/2022 1338   MONOABS 0.3 07/05/2022 1338   EOSABS 0.1 07/05/2022 1338   BASOSABS 0.0 07/05/2022 1338       Latest Ref Rng & Units 07/12/2022    4:25 AM 07/10/2022    4:10 AM 07/09/2022    3:17 AM  CMP  Glucose 70 - 99 mg/dL  657    BUN 6 - 20 mg/dL  19    Creatinine 8.46 - 1.00 mg/dL 9.62  9.52  8.41    Sodium 135 - 145 mmol/L  135    Potassium 3.5 - 5.1 mmol/L  3.5    Chloride 98 - 111 mmol/L  104    CO2 22 - 32 mmol/L  24    Calcium 8.9 - 10.3 mg/dL  8.3        Microbiology: Recent Results (from the past 240 hour(s))  Blood culture (routine x 2)     Status: None   Collection Time: 07/05/22  2:04 PM   Specimen: BLOOD  Result Value Ref Range Status   Specimen Description BLOOD BLOOD RIGHT FOREARM  Final   Special Requests   Final    BOTTLES DRAWN AEROBIC AND ANAEROBIC Blood Culture adequate volume   Culture   Final    NO GROWTH 5 DAYS Performed at [facility], [address] [place], [place] [address]    Report Status 07/10/2022 FINAL  Final  Blood culture (routine x 2)     Status: None   Collection Time: 07/05/22  2:04 PM   Specimen: BLOOD  Result Value Ref Range Status   Specimen Description BLOOD BLOOD RIGHT ARM  Final   Special Requests   Final    BOTTLES DRAWN AEROBIC AND ANAEROBIC Blood Culture results may not be optimal due to an excessive volume of blood received in culture bottles   Culture   Final    NO GROWTH 5 DAYS Performed at [facility], [address], [place], [place] [address]    Report Status 07/10/2022 FINAL  Final  Aerobic/Anaerobic Culture w Gram Stain (surgical/deep wound)     Status: None   Collection Time: 07/05/22  3:40 PM   Specimen: Foot  Result Value Ref Range Status   Specimen Description   Final    FOOT Performed at [facility], [address], [place], [place] [address]    Special Requests   Final    RF Performed at [facility], [address] [place], [place] [address]    Gram Stain   Final    RARE WBC PRESENT, PREDOMINANTLY MONONUCLEAR RARE GRAM POSITIVE COCCI IN SINGLES    Culture   Final    MODERATE METHICILLIN RESISTANT STAPHYLOCOCCUS AUREUS FEW PSEUDOMONAS AERUGINOSA NO ANAEROBES ISOLATED Performed at [facility], [address] [place], [place] [address]    Report  Status 07/11/2022 FINAL  Final   Organism ID, Bacteria METHICILLIN RESISTANT STAPHYLOCOCCUS AUREUS  Final   Organism ID, Bacteria PSEUDOMONAS AERUGINOSA  Final      Susceptibility   Methicillin resistant staphylococcus aureus - MIC*    CIPROFLOXACIN >=8 RESISTANT Resistant     ERYTHROMYCIN >=8 RESISTANT Resistant     GENTAMICIN <=0.5 SENSITIVE Sensitive     OXACILLIN >=4 RESISTANT Resistant     TETRACYCLINE <=1 SENSITIVE Sensitive     VANCOMYCIN <=0.5 SENSITIVE Sensitive     TRIMETH/SULFA >=320 RESISTANT Resistant     CLINDAMYCIN <=0.25 SENSITIVE Sensitive     RIFAMPIN <=0.5 SENSITIVE Sensitive     Inducible Clindamycin NEGATIVE Sensitive     LINEZOLID 2 SENSITIVE Sensitive     * MODERATE METHICILLIN RESISTANT STAPHYLOCOCCUS AUREUS   Pseudomonas aeruginosa - MIC*    CEFTAZIDIME >=64 RESISTANT Resistant     CIPROFLOXACIN <=0.25 SENSITIVE Sensitive     GENTAMICIN <=1 SENSITIVE Sensitive     IMIPENEM 1 SENSITIVE Sensitive     PIP/TAZO >=128 RESISTANT Resistant     * FEW PSEUDOMONAS AERUGINOSA  Aerobic/Anaerobic Culture w Gram Stain (surgical/deep wound)     Status: None (Preliminary result)   Collection Time: 07/09/22  4:45 PM   Specimen: Foot, Left; Tissue  Result Value Ref Range Status   Specimen Description   Final    WOUND Performed at [facility], [address] [place], [place] [address]    Special Requests LEFT FOOT  Final   Gram Stain   Final    NO WBC SEEN NO ORGANISMS SEEN Performed at [facility], [address], [place], [place] [address]    Culture   Final    FEW METHICILLIN RESISTANT STAPHYLOCOCCUS AUREUS NO ANAEROBES ISOLATED; CULTURE IN PROGRESS FOR 5 DAYS    Report Status PENDING  Incomplete   Organism ID, Bacteria METHICILLIN RESISTANT STAPHYLOCOCCUS AUREUS  Final      Susceptibility   Methicillin resistant staphylococcus aureus - MIC*    CIPROFLOXACIN >=8 RESISTANT Resistant     ERYTHROMYCIN >=8 RESISTANT Resistant     GENTAMICIN  <=0.5 SENSITIVE Sensitive     OXACILLIN >=4 RESISTANT Resistant     TETRACYCLINE <=1 SENSITIVE Sensitive     VANCOMYCIN 1 SENSITIVE Sensitive     TRIMETH/SULFA >=320 RESISTANT Resistant     CLINDAMYCIN <=0.25 SENSITIVE Sensitive     RIFAMPIN <=0.5 SENSITIVE Sensitive     Inducible Clindamycin NEGATIVE Sensitive     LINEZOLID 2 SENSITIVE Sensitive     * FEW METHICILLIN RESISTANT STAPHYLOCOCCUS AUREUS    IMAGING RESULTS: MRI foot reviewed Marrow edema of the fore foot bones I have personally reviewed the films ? Impression/Recommendation ? PAD / DM with rt foot infection S/p amputation of 1st and 2nd toes in [name] 2024 Now presenting with weakness/hypotension No fever or leucocytosis No procal or lactate ESR 48 , CRP < 1 MRI showed edema of the bones of the fore foot And osteo of hallux sesamoids Underwent I/D of infected soft tissue an dbone Removal of sesamoids Portion of bone from 1sta nd 2nd met sent for tests Await pathology Culture of wound MRSA If bone margin free of osteo- we can do a week of oral antibiotics cipro+ bactrim If there is active osteo ,we may have to discuss the benefits and risk of long term PO cipro/bactrim VS IV antibiotics   Hypotension - doubt it was due to an infection The foot actually looked okay  Check cortisol  Diabetes mellitus with hyperglycemia- management as per primary team  PAD h/o rt PTA  thrombectomy . angiopalsty  Anemia- check B12, folate, iron  CHf ___________________________________________________ Discussed with patient in detail Note:  This document was prepared using Dragon voice recognition software and may include unintentional dictation errors.

## 2022-07-12 NOTE — Progress Notes (Signed)
3 Days Post-Op   Subjective/Chief Complaint: Patient seen.  Not complaining of any pain.   Objective: Vital signs in last 24 hours: Temp:  [98.2 F (36.8 C)-99 F (37.2 C)] 98.9 F (37.2 C) (06/17 0859) Pulse Rate:  [80-85] 80 (06/17 0859) Resp:  [16-19] 19 (06/17 0859) BP: (112-135)/(65-76) 119/68 (06/17 0859) SpO2:  [97 %-100 %] 100 % (06/17 0859) Last BM Date : 07/10/22  Intake/Output from previous day: 06/16 0701 - 06/17 0700 In: 2946.8 [P.O.:240; IV Piggyback:2706.8] Out: 2600 [Urine:2600] Intake/Output this shift: Total I/O In: -  Out: 400 [Urine:400]  Bandage on the right foot is dry and intact.  Upon removal there is only mild bleeding with some mild drainage.  Incision is well coapted.  No erythema.  Some bleeding from the heel ulcer on her right.  Lab Results:  Recent Labs    07/10/22 0410 07/11/22 0520  WBC 6.3  --   HGB 7.7* 7.5*  HCT 26.5* 26.4*  PLT 224  --    BMET Recent Labs    07/10/22 0410 07/12/22 0425  NA 135  --   K 3.5  --   CL 104  --   CO2 24  --   GLUCOSE 486*  --   BUN 19  --   CREATININE 0.59 0.39*  CALCIUM 8.3*  --    PT/INR No results for input(s): "LABPROT", "INR" in the last 72 hours. ABG No results for input(s): "PHART", "HCO3" in the last 72 hours.  Invalid input(s): "PCO2", "PO2"  Studies/Results: No results found.  Anti-infectives: Anti-infectives (From admission, onward)    Start     Dose/Rate Route Frequency Ordered Stop   07/09/22 1200  vancomycin (VANCOCIN) IVPB 1000 mg/200 mL premix        1,000 mg 200 mL/hr over 60 Minutes Intravenous Every 12 hours 07/09/22 1049     07/09/22 1000  ciprofloxacin (CIPRO) IVPB 400 mg        400 mg 200 mL/hr over 60 Minutes Intravenous Every 12 hours 07/09/22 0903     07/06/22 1000  vancomycin (VANCOREADY) IVPB 1250 mg/250 mL  Status:  Discontinued        1,250 mg 166.7 mL/hr over 90 Minutes Intravenous Every 24 hours 07/05/22 1922 07/09/22 1048   07/05/22 2200  ceFEPIme  (MAXIPIME) 2 g in sodium chloride 0.9 % 100 mL IVPB  Status:  Discontinued        2 g 200 mL/hr over 30 Minutes Intravenous Every 8 hours 07/05/22 1922 07/09/22 0902   07/05/22 1745  metroNIDAZOLE (FLAGYL) IVPB 500 mg        500 mg 100 mL/hr over 60 Minutes Intravenous Every 12 hours 07/05/22 1731 07/12/22 1744   07/05/22 1615  vancomycin (VANCOCIN) IVPB 1000 mg/200 mL premix        1,000 mg 200 mL/hr over 60 Minutes Intravenous  Once 07/05/22 1609 07/05/22 1822   07/05/22 1615  piperacillin-tazobactam (ZOSYN) IVPB 3.375 g        3.375 g 100 mL/hr over 30 Minutes Intravenous  Once 07/05/22 1609 07/05/22 1721       Assessment/Plan: s/p Procedure(s): IRRIGATION AND DEBRIDEMENT WOUND (Right) Assessment: Stable status post debridement right foot.  Plan: Betadine gauze applied to the incision followed by a sterile bandage.  Betadine gauze and ABD applied to the heel ulceration as well.  Discussed with the patient that we are still waiting for the pathology results.  Spoke with the nurse who is going to get her  Prevalon boots to offload the heel ulcers.  Podiatry will continue to follow.  LOS: 7 days    Ricci Barker 07/12/2022

## 2022-07-13 ENCOUNTER — Other Ambulatory Visit (HOSPITAL_COMMUNITY): Payer: Self-pay

## 2022-07-13 DIAGNOSIS — Z794 Long term (current) use of insulin: Secondary | ICD-10-CM | POA: Diagnosis not present

## 2022-07-13 DIAGNOSIS — E11628 Type 2 diabetes mellitus with other skin complications: Secondary | ICD-10-CM | POA: Diagnosis not present

## 2022-07-13 DIAGNOSIS — L089 Local infection of the skin and subcutaneous tissue, unspecified: Secondary | ICD-10-CM | POA: Diagnosis not present

## 2022-07-13 LAB — BASIC METABOLIC PANEL
Anion gap: 6 (ref 5–15)
BUN: 19 mg/dL (ref 6–20)
CO2: 28 mmol/L (ref 22–32)
Calcium: 8.3 mg/dL — ABNORMAL LOW (ref 8.9–10.3)
Chloride: 100 mmol/L (ref 98–111)
Creatinine, Ser: 0.39 mg/dL — ABNORMAL LOW (ref 0.44–1.00)
GFR, Estimated: >60 mL/min (ref >60)
Glucose, Bld: 172 mg/dL — ABNORMAL HIGH (ref 70–99)
Potassium: 3.3 mmol/L — ABNORMAL LOW (ref 3.5–5.1)
Sodium: 134 mmol/L — ABNORMAL LOW (ref 135–145)

## 2022-07-13 LAB — GLUCOSE, CAPILLARY
Glucose-Capillary: 158 mg/dL — ABNORMAL HIGH (ref 70–99)
Glucose-Capillary: 119 mg/dL — ABNORMAL HIGH (ref 70–99)
Glucose-Capillary: 64 mg/dL — ABNORMAL LOW (ref 70–99)
Glucose-Capillary: 344 mg/dL — ABNORMAL HIGH (ref 70–99)

## 2022-07-13 LAB — CBC
HCT: 26.6 % — ABNORMAL LOW (ref 36.0–46.0)
Hemoglobin: 7.8 g/dL — ABNORMAL LOW (ref 12.0–15.0)
MCH: 23.3 pg — ABNORMAL LOW (ref 26.0–34.0)
MCHC: 29.3 g/dL — ABNORMAL LOW (ref 30.0–36.0)
MCV: 79.4 fL — ABNORMAL LOW (ref 80.0–100.0)
Platelets: 238 10*3/uL (ref 150–400)
RBC: 3.35 MIL/uL — ABNORMAL LOW (ref 3.87–5.11)
RDW: 16 % — ABNORMAL HIGH (ref 11.5–15.5)
WBC: 5.1 10*3/uL (ref 4.0–10.5)
nRBC: 0 % (ref 0.0–0.2)

## 2022-07-13 LAB — FOLATE: Folate: 12.8 ng/mL (ref >5.9)

## 2022-07-13 LAB — FERRITIN: Ferritin: 8 ng/mL — ABNORMAL LOW (ref 11–307)

## 2022-07-13 LAB — VITAMIN B12: Vitamin B-12: 374 pg/mL (ref 180–914)

## 2022-07-13 LAB — SURGICAL PATHOLOGY

## 2022-07-13 LAB — CORTISOL-AM, BLOOD: Cortisol - AM: 4 ug/dL — ABNORMAL LOW (ref 6.7–22.6)

## 2022-07-13 MED ORDER — COSYNTROPIN 0.25 MG IJ SOLR
0.2500 mg | Freq: Once | INTRAMUSCULAR | Status: AC
  Administered 2022-07-14: 0.25 mg via INTRAVENOUS
  Filled 2022-07-13 (×2): qty 0.25

## 2022-07-13 NOTE — Progress Notes (Signed)
Date of Admission:  07/05/2022      ID: Albertina Piper is a 52 y.o. female with   Principal Problem:   Diabetic foot infection (HCC) Active Problems:   Hyperglycemia due to type 2 diabetes mellitus (HCC)   Chronic systolic CHF (congestive heart failure) (HCC)   Essential hypertension   PAD (peripheral artery disease) (HCC)   CAD S/P percutaneous coronary angioplasty   GERD without esophagitis   Dyslipidemia   COPD (chronic obstructive pulmonary disease) (HCC)   Atrial fibrillation (HCC)   Malnutrition of moderate degree    Subjective: Doing better  Medications:   amLODipine  10 mg Oral Daily   apixaban  5 mg Oral BID   vitamin C  500 mg Oral BID   aspirin EC  81 mg Oral Daily   atorvastatin  80 mg Oral Daily   clopidogrel  75 mg Oral Daily   feeding supplement  1 Container Oral TID BM   furosemide  20 mg Oral Daily   insulin aspart  0-15 Units Subcutaneous TID WC   insulin aspart  0-5 Units Subcutaneous QHS   insulin aspart  8 Units Subcutaneous TID WC   insulin glargine-yfgn  35 Units Subcutaneous Daily   multivitamin with minerals  1 tablet Oral Daily   pantoprazole  40 mg Oral Daily   tiotropium  1 capsule Inhalation Daily   zinc sulfate  220 mg Oral Daily    Objective: Vital signs in last 24 hours: Patient Vitals for the past 24 hrs:  BP Temp Pulse Resp SpO2  07/13/22 0733 117/66 98.2 F (36.8 C) 81 16 98 %  07/12/22 2320 100/61 98.1 F (36.7 C) 81 18 98 %     PHYSICAL EXAM:  General: Alert, cooperative, no distress, very pale Head: Normocephalic, without obvious abnormality, atraumatic. Eyes: Conjunctivae clear, anicteric sclerae. Pupils are equal ENT Nares normal. No drainage or sinus tenderness. Lips, mucosa, and tongue normal. No Thrush Neck: Supple, symmetrical, no adenopathy, thyroid: non tender no carotid bruit and no JVD. Back: No CVA tenderness. Lungs: Clear to auscultation bilaterally. No Wheezing or Rhonchi. No rales. Heart: Regular rate  and rhythm, no murmur, rub or gallop. Abdomen: Soft, non-tender,not distended. Bowel sounds normal. No masses Extremities: pictures reviewed  Lymph: Cervical, supraclavicular normal. Neurologic: Grossly non-focal  Lab Results    Latest Ref Rng & Units 07/13/2022    4:26 AM 07/11/2022    5:20 AM 07/10/2022    4:10 AM  CBC  WBC 4.0 - 10.5 K/uL 5.1   6.3   Hemoglobin 12.0 - 15.0 g/dL 7.8  7.5  7.7   Hematocrit 36.0 - 46.0 % 26.6  26.4  26.5   Platelets 150 - 400 K/uL 238   224        Latest Ref Rng & Units 07/13/2022    4:26 AM 07/12/2022    4:25 AM 07/10/2022    4:10 AM  CMP  Glucose 70 - 99 mg/dL 161   096   BUN 6 - 20 mg/dL 19   19   Creatinine 0.45 - 1.00 mg/dL 4.09  8.11  9.14   Sodium 135 - 145 mmol/L 134   135   Potassium 3.5 - 5.1 mmol/L 3.3   3.5   Chloride 98 - 111 mmol/L 100   104   CO2 22 - 32 mmol/L 28   24   Calcium 8.9 - 10.3 mg/dL 8.3   8.3       Microbiology: MRSA in  wound culture Pseudomonas  Studies/Results: No results found.   Assessment/Plan: PAD / DM with rt foot infection S/p amputation of 1st and 2nd toes in [name] 2024 Now presenting with weakness/hypotension No fever or leucocytosis No procal or lactate ESR 48 , CRP < 1 MRI showed edema of the bones of the fore foot And osteo of hallux sesamoids Underwent I/D of infected soft tissue an dbone Removal of sesamoids Portion of bone from 1sta nd 2nd met sent for tests Chronic focal osteomyelitis Culture of wound MRSA Ias wound looks good and well approximated will treat her with Po Doxy 100mg  BID x 3 weeks + Cipro 500mg  Po BID X 3 weeks + 2 weekly doses of 1500mg  Dalbavancin She will get one tomorrow and 2nd dose after a week Follow up as OP    Hypotension - doubt it was due to an infection The foot actually looked okay  Cortisol low Will need ACTH and cosyntropin test   Diabetes mellitus with hyperglycemia- management as per primary team   PAD h/o rt PTA thrombectomy . angiopalsty    Anemia- very low iron    CHf  Discussed the management with patient and hospitalist

## 2022-07-13 NOTE — Progress Notes (Signed)
Nutrition Follow-up  DOCUMENTATION CODES:   Non-severe (moderate) malnutrition in context of chronic illness  INTERVENTION:   -Continue MVI with minerals daily -Continue 500 mg vitamin C BID -Continue 220 mg zinc sulfate daily x 14 days -Continue double protein portions with meals  NUTRITION DIAGNOSIS:   Moderate Malnutrition related to chronic illness (PAD) as evidenced by mild fat depletion, moderate fat depletion, moderate muscle depletion, severe muscle depletion.  Ongoing  GOAL:   Patient will meet greater than or equal to 90% of their needs  Progressing   MONITOR:   PO intake, Supplement acceptance  REASON FOR ASSESSMENT:   Consult Wound healing  ASSESSMENT:   Pt with medical history significant of multiple medical issues including peripheral artery disease, hyperglycemia with type 2 diabetes, GERD, HFpEF, coronary artery disease presenting with right diabetic foot infection  5/9- s/p right first and second toe metatarsal head amputation  6/14- s/p I&D infected soft tissue and bone right forefoot   Reviewed I/O's: -460 ml x 24 hours and -3.6 L since admission  UOP: 900 ml x 24 hours   Surgical pathology pending. ID has been consulted for antibiotic recommendations.   Pt in with MD at time of visit.   Pt remains on a carb modified diet. She remains with a good appetite. Noted meal completions 100%.   Per TOC notes, plan home with home health services.   Medications reviewed and include lasix and cipro.   Labs reviewed: Na: 134, K: 3.3, CBGS: 119-158 (inpatient orders for glycemic control are 0-15 units insulin aspart TID with meals, 0-5 units insulin aspart daily at bedtime, 8 units insulin aspart TID with meals, and 35 units inuslin glargine-yfgn daily).    Diet Order:   Diet Order             Diet Carb Modified Fluid consistency: Thin; Room service appropriate? Yes  Diet effective now                   EDUCATION NEEDS:   Education needs  have been addressed  Skin:  Skin Assessment: Skin Integrity Issues: Skin Integrity Issues:: Incisions Incisions: closed lt foot  Last BM:  07/13/22 (type 2)  Height:   Ht Readings from Last 1 Encounters:  07/09/22 5\' 6"  (1.676 m)    Weight:   Wt Readings from Last 1 Encounters:  07/09/22 53.5 kg    Ideal Body Weight:  59.1 kg  BMI:  Body mass index is 19.05 kg/m.  Estimated Nutritional Needs:   Kcal:  1700-1900  Protein:  90-105 grams  Fluid:  > 1.7 L    Levada Schilling, RD, LDN, CDCES Registered Dietitian II Certified Diabetes Care and Education Specialist Please refer to [facility] for RD and/or RD on-call/weekend/after hours pager

## 2022-07-13 NOTE — Progress Notes (Signed)
Orthopedic Tech Progress Note Patient Details:  Beth Carroll 1970/04/01 [id number]  Called in order to [employer] for a PRAFO BOOT  Patient ID: Beth Carroll, female   DOB: 1970-09-15, 52 y.o.   MRN: [id number]  [name] 07/13/2022, 3:13 PM

## 2022-07-13 NOTE — Progress Notes (Addendum)
PROGRESS NOTE    Beth Carroll   ZOX:[id number] DOB: 06-May-1970  DOA: 07/05/2022 Date of Service: 07/13/22 PCP: Gracelyn Nurse, MD     Brief Narrative / Hospital Course:  Beth Carroll is a 52 y.o. female with medical history significant of multiple medical issues including peripheral artery disease, hyperglycemia with type 2 diabetes, GERD, HFpEF, coronary artery disease presenting to ED 07/05/2022 with right diabetic foot infection. Recent 1st and 2nd R toe amputations 06/03/2022 w/ Dr Wyn Quaker. Seen in follow up 05/31 in vascular office, some drainage and bleeding / serous discharge, advised elevation of limb and also Rx abx. Seen at wound care 06/03 and 06/10. On 06/10, Beth Carroll reporting weakness/fatigue and noted to have low BP in wound clinic. was sent to ED.  06/10: Possible osteomyelitis. Admitted to hospitalist service.  06/11: Vascular recs for podiatry eval, podiatry plans for I&D but need to be off Plavix and Eliquis so plan for 06/14.  06/12: BCx NGx2d. Wound Cx (+)GPC 06/13: Wound Cx mod staph aureus and few pseudomonas, pend suscept 06/14: abx to cipro. I&D infected soft tissue and bone right forefoot.  today.  06/15: Beth Carroll/OT recs pending. Surgical path pending. Restart Eliquis + Plavix 06/16: Beth Carroll recs for HH. Dressing change tomorrow.  06/17: Surgical path still pending. Dr Alberteen Spindle notes good healing and recs for ID consult. 06/18: (+)margins on bone. ID following. Will also f/u on low cortisol levels.   Consultants:  Vascular surgery  Podiatry   Procedures: 07/09/2022 I&D infected soft tissue and bone right forefoot     ASSESSMENT & PLAN:   Principal Problem:   Diabetic foot infection (HCC) Active Problems:   Hyperglycemia due to type 2 diabetes mellitus (HCC)   Chronic systolic CHF (congestive heart failure) (HCC)   Essential hypertension   PAD (peripheral artery disease) (HCC)   CAD S/P percutaneous coronary angioplasty   GERD without esophagitis   Dyslipidemia    COPD (chronic obstructive pulmonary disease) (HCC)   Atrial fibrillation (HCC)   Malnutrition of moderate degree   Diabetic foot infection (HCC) Cx(+) mod staph aureus and few pseudomonas 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 Wyn Quaker S/p I&D infected soft tissue and bone right forefoot 07/09/2022 if pathology doesn't show clear margins may need further surgery / IV abx.  Blood cultures NGx2d+ Vascular surgery and podiatry teams following  Surgical path pending ID consult - final abx plan pending  Low cortisol ACTH stimulation test tomorrow    Chronic systolic CHF (congestive heart failure) (HCC) 2D echo February 2024 with EF of 50 to 55% Appears euvolemic Monitor volume status closely  Anemia Monitor CBC Transfuse as needed / Hgb <7 or Hgb <8 w/ complications - Beth Carroll has verbally consented to transfusion if needed   CAD S/P percutaneous coronary angioplasty No active chest pain Continue home ASA, atorvastatin   COPD (chronic obstructive pulmonary disease) (HCC) Stable, no acute exacerbation  Continue home inhalers  Essential hypertension BP stable Titrate home regimen as needed  Added amlodipine for now, may consider ACE/ARB +/- diuretic on discharge   GERD without esophagitis PPI  Hyperglycemia due to type 2 diabetes mellitus (HCC) Blood sugars in 300s on presentation Major contributor to wound complications which was discussed with the patient Continue home long-acting insulin - increased dose to 35 units daily   Sliding-scale novolog meal times   PAD (peripheral artery disease) (HCC) Restart ASA/Plavix   Dyslipidemia Continue statin  Abnormal EKG Noted atrial fibrillation on EKG  but substantial artifact, difficult to assess P waves, suspect actually NSR  Had stable ECHO 02/2022  Cardiology consult here if decompensates, vs outpatient f/u as clinically indicated.  Telemetry for now appears sinus rhythm     DVT prophylaxis:  Eliquis  Pertinent IV fluids/nutrition: d/c continuous IV fluids  Central lines / invasive devices: none  Code Status: FULL CODE ACP documentation reviewed: 07/06/22 none on file [name]   Current Admission Status: inpatient   TOC needs / Dispo plan: HH Barriers to discharge / significant pending items: pending path results from surgery to determine abx plan              Subjective / Brief ROS:  Beth Carroll doing well  Pain controlled Working w/ Beth Carroll/OT No fever/chills    Family Communication: none at this time     Objective Findings:  Vitals:   07/12/22 0859 07/12/22 2320 07/13/22 0733 07/13/22 1512  BP: 119/68 100/61 117/66 97/66  Pulse: 80 81 81 88  Resp: 19 18 16 16   Temp: 98.9 F (37.2 C) 98.1 F (36.7 C) 98.2 F (36.8 C) 98.3 F (36.8 C)  TempSrc:      SpO2: 100% 98% 98% 100%  Weight:      Height:        Intake/Output Summary (Last 24 hours) at 07/13/2022 1712 Last data filed at 07/13/2022 0300 Gross per 24 hour  Intake 440 ml  Output 500 ml  Net -60 ml    Filed Weights   07/05/22 1322 07/09/22 1533  Weight: 53.5 kg 53.5 kg    Examination:  Physical Exam Constitutional:      General: She is not in acute distress. Pulmonary:     Effort: Pulmonary effort is normal.     Breath sounds: Normal breath sounds.  Neurological:     Mental Status: She is alert. Mental status is at baseline.  Psychiatric:        Mood and Affect: Mood normal.        Behavior: Behavior normal.          Scheduled Medications:   amLODipine  10 mg Oral Daily   apixaban  5 mg Oral BID   vitamin C  500 mg Oral BID   aspirin EC  81 mg Oral Daily   atorvastatin  80 mg Oral Daily   clopidogrel  75 mg Oral Daily   [START ON 07/14/2022] cosyntropin  0.25 mg Intravenous Once   feeding supplement  1 Container Oral TID BM   furosemide  20 mg Oral Daily   insulin aspart  0-15 Units Subcutaneous TID WC   insulin aspart  0-5 Units Subcutaneous QHS   insulin aspart  8 Units  Subcutaneous TID WC   insulin glargine-yfgn  35 Units Subcutaneous Daily   multivitamin with minerals  1 tablet Oral Daily   pantoprazole  40 mg Oral Daily   tiotropium  1 capsule Inhalation Daily   zinc sulfate  220 mg Oral Daily    Continuous Infusions:  ciprofloxacin 400 mg (07/13/22 0942)   vancomycin 1,000 mg (07/13/22 1259)    PRN Medications:  acetaminophen **OR** acetaminophen, albuterol, dextrose, diphenhydrAMINE, HYDROcodone-acetaminophen, morphine injection, ondansetron **OR** ondansetron (ZOFRAN) IV  Antimicrobials from admission:  Anti-infectives (From admission, onward)    Start     Dose/Rate Route Frequency Ordered Stop   07/09/22 1200  vancomycin (VANCOCIN) IVPB 1000 mg/200 mL premix        1,000 mg 200 mL/hr over 60 Minutes Intravenous Every 12 hours  07/09/22 1049     07/09/22 1000  ciprofloxacin (CIPRO) IVPB 400 mg        400 mg 200 mL/hr over 60 Minutes Intravenous Every 12 hours 07/09/22 0903     07/06/22 1000  vancomycin (VANCOREADY) IVPB 1250 mg/250 mL  Status:  Discontinued        1,250 mg 166.7 mL/hr over 90 Minutes Intravenous Every 24 hours 07/05/22 1922 07/09/22 1048   07/05/22 2200  ceFEPIme (MAXIPIME) 2 g in sodium chloride 0.9 % 100 mL IVPB  Status:  Discontinued        2 g 200 mL/hr over 30 Minutes Intravenous Every 8 hours 07/05/22 1922 07/09/22 0902   07/05/22 1745  metroNIDAZOLE (FLAGYL) IVPB 500 mg        500 mg 100 mL/hr over 60 Minutes Intravenous Every 12 hours 07/05/22 1731 07/12/22 1744   07/05/22 1615  vancomycin (VANCOCIN) IVPB 1000 mg/200 mL premix        1,000 mg 200 mL/hr over 60 Minutes Intravenous  Once 07/05/22 1609 07/05/22 1822   07/05/22 1615  piperacillin-tazobactam (ZOSYN) IVPB 3.375 g        3.375 g 100 mL/hr over 30 Minutes Intravenous  Once 07/05/22 1609 07/05/22 1721           Data Reviewed:  I have personally reviewed the following...  CBC: Recent Labs  Lab 07/07/22 0351 07/08/22 0357 07/09/22 0317  07/10/22 0410 07/11/22 0520 07/13/22 0426  WBC 4.5 4.5  --  6.3  --  5.1  HGB 7.6* 7.6* 8.2* 7.7* 7.5* 7.8*  HCT 26.2* 26.4* 28.1* 26.5* 26.4* 26.6*  MCV 79.6* 79.5*  --  80.3  --  79.4*  PLT 222 214  --  224  --  238    Basic Metabolic Panel: Recent Labs  Lab 07/07/22 0351 07/08/22 0357 07/09/22 0317 07/10/22 0410 07/12/22 0425 07/13/22 0426  NA 136 136  --  135  --  134*  K 3.4* 3.5  --  3.5  --  3.3*  CL 105 107  --  104  --  100  CO2 27 24  --  24  --  28  GLUCOSE 395* 321*  --  486*  --  172*  BUN 16 24*  --  19  --  19  CREATININE 0.47 0.53 0.51 0.59 0.39* 0.39*  CALCIUM 8.1* 8.6*  --  8.3*  --  8.3*    GFR: Estimated Creatinine Clearance: 69.5 mL/min (A) (by C-G formula based on SCr of 0.39 mg/dL (L)). Liver Function Tests: No results for input(s): "AST", "ALT", "ALKPHOS", "BILITOT", "PROT", "ALBUMIN" in the last 168 hours.  No results for input(s): "LIPASE", "AMYLASE" in the last 168 hours. No results for input(s): "AMMONIA" in the last 168 hours. Coagulation Profile: No results for input(s): "INR", "PROTIME" in the last 168 hours. Cardiac Enzymes: No results for input(s): "CKTOTAL", "CKMB", "CKMBINDEX", "TROPONINI" in the last 168 hours. BNP (last 3 results) No results for input(s): "PROBNP" in the last 8760 hours. HbA1C: No results for input(s): "HGBA1C" in the last 72 hours. CBG: Recent Labs  Lab 07/12/22 1623 07/12/22 2124 07/13/22 0732 07/13/22 1138 07/13/22 1636  GLUCAP 136* 121* 158* 119* 64*    Lipid Profile: No results for input(s): "CHOL", "HDL", "LDLCALC", "TRIG", "CHOLHDL", "LDLDIRECT" in the last 72 hours. Thyroid Function Tests: No results for input(s): "TSH", "T4TOTAL", "FREET4", "T3FREE", "THYROIDAB" in the last 72 hours. Anemia Panel: Recent Labs    07/12/22 2218 07/13/22 0426  VITAMINB12  --  374  FOLATE  --  12.8  FERRITIN  --  8*  TIBC 316  --   IRON 18*  --    Most Recent Urinalysis On File:     Component Value  Date/Time   COLORURINE YELLOW (A) 06/01/2022 1800   APPEARANCEUR HAZY (A) 06/01/2022 1800   APPEARANCEUR Clear 08/30/2012 0047   LABSPEC 1.008 06/01/2022 1800   LABSPEC 1.014 08/30/2012 0047   PHURINE 7.0 06/01/2022 1800   GLUCOSEU >=500 (A) 06/01/2022 1800   GLUCOSEU >=500 08/30/2012 0047   HGBUR MODERATE (A) 06/01/2022 1800   BILIRUBINUR NEGATIVE 06/01/2022 1800   BILIRUBINUR Negative 08/30/2012 0047   KETONESUR NEGATIVE 06/01/2022 1800   PROTEINUR NEGATIVE 06/01/2022 1800   NITRITE NEGATIVE 06/01/2022 1800   LEUKOCYTESUR LARGE (A) 06/01/2022 1800   LEUKOCYTESUR 1+ 08/30/2012 0047   Sepsis Labs: @LABRCNTIP (procalcitonin:4,lacticidven:4) Microbiology: Recent Results (from the past 240 hour(s))  Blood culture (routine x 2)     Status: None   Collection Time: 07/05/22  2:04 PM   Specimen: BLOOD  Result Value Ref Range Status   Specimen Description BLOOD BLOOD RIGHT FOREARM  Final   Special Requests   Final    BOTTLES DRAWN AEROBIC AND ANAEROBIC Blood Culture adequate volume   Culture   Final    NO GROWTH 5 DAYS Performed at [facility], [address] [place], [place] [address]    Report Status 07/10/2022 FINAL  Final  Blood culture (routine x 2)     Status: None   Collection Time: 07/05/22  2:04 PM   Specimen: BLOOD  Result Value Ref Range Status   Specimen Description BLOOD BLOOD RIGHT ARM  Final   Special Requests   Final    BOTTLES DRAWN AEROBIC AND ANAEROBIC Blood Culture results may not be optimal due to an excessive volume of blood received in culture bottles   Culture   Final    NO GROWTH 5 DAYS Performed at [facility], [address] [place], [place] [address]    Report Status 07/10/2022 FINAL  Final  Aerobic/Anaerobic Culture w Gram Stain (surgical/deep wound)     Status: None   Collection Time: 07/05/22  3:40 PM   Specimen: Foot  Result Value Ref Range Status   Specimen Description   Final    FOOT Performed at [facility], [address] [place], [place] [address]    Special Requests   Final    RF Performed at [facility], [address] [place], [place] [address]    Gram Stain   Final    RARE WBC PRESENT, PREDOMINANTLY MONONUCLEAR RARE GRAM POSITIVE COCCI IN SINGLES    Culture   Final    MODERATE METHICILLIN RESISTANT STAPHYLOCOCCUS AUREUS FEW PSEUDOMONAS AERUGINOSA NO ANAEROBES ISOLATED Performed at [facility], [address] [place], [place] [address]    Report Status 07/11/2022 FINAL  Final   Organism ID, Bacteria METHICILLIN RESISTANT STAPHYLOCOCCUS AUREUS  Final   Organism ID, Bacteria PSEUDOMONAS AERUGINOSA  Final      Susceptibility   Methicillin resistant staphylococcus aureus - MIC*    CIPROFLOXACIN >=8 RESISTANT Resistant     ERYTHROMYCIN >=8 RESISTANT Resistant     GENTAMICIN <=0.5 SENSITIVE Sensitive     OXACILLIN >=4 RESISTANT Resistant     TETRACYCLINE <=1 SENSITIVE Sensitive     VANCOMYCIN <=0.5 SENSITIVE Sensitive     TRIMETH/SULFA >=320 RESISTANT Resistant     CLINDAMYCIN <=0.25 SENSITIVE Sensitive  RIFAMPIN <=0.5 SENSITIVE Sensitive     Inducible Clindamycin NEGATIVE Sensitive     LINEZOLID 2 SENSITIVE Sensitive     * MODERATE METHICILLIN RESISTANT STAPHYLOCOCCUS AUREUS   Pseudomonas aeruginosa - MIC*    CEFTAZIDIME >=64 RESISTANT Resistant     CIPROFLOXACIN <=0.25 SENSITIVE Sensitive     GENTAMICIN <=1 SENSITIVE Sensitive     IMIPENEM 1 SENSITIVE Sensitive     PIP/TAZO >=128 RESISTANT Resistant     * FEW PSEUDOMONAS AERUGINOSA  Aerobic/Anaerobic Culture w Gram Stain (surgical/deep wound)     Status: None (Preliminary result)   Collection Time: 07/09/22  4:45 PM   Specimen: Foot, Left; Tissue  Result Value Ref Range Status   Specimen Description   Final    WOUND Performed at [facility], [address] [place], [place] [address]    Special Requests LEFT FOOT  Final   Gram Stain   Final    NO WBC SEEN NO ORGANISMS  SEEN Performed at [facility], [address] [place], [place] [address]    Culture   Final    FEW METHICILLIN RESISTANT STAPHYLOCOCCUS AUREUS NO ANAEROBES ISOLATED; CULTURE IN PROGRESS FOR 5 DAYS    Report Status PENDING  Incomplete   Organism ID, Bacteria METHICILLIN RESISTANT STAPHYLOCOCCUS AUREUS  Final      Susceptibility   Methicillin resistant staphylococcus aureus - MIC*    CIPROFLOXACIN >=8 RESISTANT Resistant     ERYTHROMYCIN >=8 RESISTANT Resistant     GENTAMICIN <=0.5 SENSITIVE Sensitive     OXACILLIN >=4 RESISTANT Resistant     TETRACYCLINE <=1 SENSITIVE Sensitive     VANCOMYCIN 1 SENSITIVE Sensitive     TRIMETH/SULFA >=320 RESISTANT Resistant     CLINDAMYCIN <=0.25 SENSITIVE Sensitive     RIFAMPIN <=0.5 SENSITIVE Sensitive     Inducible Clindamycin NEGATIVE Sensitive     LINEZOLID 2 SENSITIVE Sensitive     * FEW METHICILLIN RESISTANT STAPHYLOCOCCUS AUREUS      Radiology Studies last 3 days: No results found.           LOS: 8 days        Sunnie Nielsen, DO [facility] 07/13/2022, 5:12 PM    Dictation software may have been used to generate the above note. Typos may occur and escape review in typed/dictated notes. Please contact Dr Lyn Hollingshead directly for clarity if needed.  Staff may message me via secure chat in Epic  but this may not receive an immediate response,  please page me for urgent matters!  If 7PM-7AM, please contact night coverage [email or web address]

## 2022-07-13 NOTE — Plan of Care (Signed)

## 2022-07-13 NOTE — Progress Notes (Signed)
Physical Therapy Treatment Patient Details Name: Beth Carroll MRN: [id number] DOB: [birth year] Today's Date: 07/13/2022   History of Present Illness Pt is a 52 y.o. female presenting to hospital from wound care clinic with hypotension.  Pt admitted with diabetic foot infection, hyperglycemia, and atrial fibrillation.  S/p I&D infected soft tissue and bone R forefoot d/t osteomyelitis R foot.  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    PT Comments    Pt pleasant and motivated for therapy, lying in bed upon arrival. Supine therex completed at start of session. 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 Pt was able to perform ankle pumps in a limited ROM on her LLE as a result of these contractures but was unable to actively move her RLE through any appreciable ROM; she demonstrated some limited passive ROM. Pt able to transfer using sliding board to get from bed to recliner with min guard from therapist for safety; no verbal cues required. Pt will benefit from continued PT services upon discharge to safely address deficits listed in patient problem list for decreased caregiver assistance and eventual return to PLOF.   Recommendations for follow up therapy are one component of a multi-disciplinary discharge planning process, led by the attending physician.  Recommendations may be updated based on patient status, additional functional criteria and insurance authorization.  Follow Up Recommendations       Assistance Recommended at Discharge Frequent or constant Supervision/Assistance  Patient can return home with the following A little help with walking and/or transfers;A little help with bathing/dressing/bathroom;Assistance with cooking/housework;Assist for transportation;Help with stairs  or ramp for entrance   Equipment Recommendations  None recommended by PT    Recommendations for Other Services       Precautions / Restrictions Precautions Precautions: Fall Restrictions Weight Bearing Restrictions: Yes RLE Weight Bearing: Partial weight bearing RLE Partial Weight Bearing Percentage or Pounds: Pt can PWB through heel when wearing post-op shoe if she is able to achieve a right angle at the ankle Other Position/Activity Restrictions: Elevate operative extremity on 2 pillows at all times     Mobility  Bed Mobility Overal bed mobility: Needs Assistance Bed Mobility: Supine to Sit     Supine to sit: Supervision     General bed mobility comments: increased time/effort    Transfers Overall transfer level: Needs assistance Equipment used: Sliding board Transfers: Bed to chair/wheelchair/BSC             General transfer comment: Set-up assistance needed, but afterwards min guard for safety. No cueing required.    Ambulation/Gait               General Gait Details: Deferred--pt currently non-ambulatory.   Stairs             Wheelchair Mobility    Modified Rankin (Stroke Patients Only)       Balance Overall balance assessment: Needs assistance Sitting-balance support: No upper extremity supported, Feet unsupported Sitting balance-Leahy Scale: Good                                      Cognition Arousal/Alertness: Awake/alert Behavior During Therapy: WFL for tasks assessed/performed Overall Cognitive Status: Within Functional Limits for tasks assessed  Exercises Total Joint Exercises Ankle Circles/Pumps: AROM, Both, 10 reps, Supine Straight Leg Raises: Strengthening, Both, 10 reps, Supine    General Comments        Pertinent Vitals/Pain Pain Assessment Pain Assessment: No/denies pain    Home Living                          Prior  Function            PT Goals (current goals can now be found in the care plan section) Progress towards PT goals: Progressing toward goals    Frequency    Min 2X/week      PT Plan Current plan remains appropriate    Co-evaluation              AM-PAC PT "6 Clicks" Mobility   Outcome Measure  Help needed turning from your back to your side while in a flat bed without using bedrails?: None Help needed moving from lying on your back to sitting on the side of a flat bed without using bedrails?: None Help needed moving to and from a bed to a chair (including a wheelchair)?: A Little Help needed standing up from a chair using your arms (e.g., wheelchair or bedside chair)?: A Little Help needed to walk in hospital room?: Total Help needed climbing 3-5 steps with a railing? : Total 6 Click Score: 16    End of Session Equipment Utilized During Treatment: Gait belt Activity Tolerance: Patient tolerated treatment well Patient left: in chair;with chair alarm set;with call bell/phone within reach Nurse Communication: Mobility status;Weight bearing status PT Visit Diagnosis: Other abnormalities of gait and mobility (R26.89);Muscle weakness (generalized) (M62.81) Pain - Right/Left: Right Pain - part of body: Ankle and joints of foot     Time: 1610-9604 PT Time Calculation (min) (ACUTE ONLY): 34 min  Charges:                        Cena Benton, SPT 07/13/22, 1:57 PM

## 2022-07-14 DIAGNOSIS — L089 Local infection of the skin and subcutaneous tissue, unspecified: Secondary | ICD-10-CM | POA: Diagnosis not present

## 2022-07-14 DIAGNOSIS — E1169 Type 2 diabetes mellitus with other specified complication: Secondary | ICD-10-CM | POA: Diagnosis not present

## 2022-07-14 DIAGNOSIS — M86671 Other chronic osteomyelitis, right ankle and foot: Secondary | ICD-10-CM | POA: Diagnosis not present

## 2022-07-14 DIAGNOSIS — E11628 Type 2 diabetes mellitus with other skin complications: Secondary | ICD-10-CM | POA: Diagnosis not present

## 2022-07-14 LAB — CORTISOL: Cortisol, Plasma: 4.7 ug/dL

## 2022-07-14 LAB — AEROBIC/ANAEROBIC CULTURE W GRAM STAIN (SURGICAL/DEEP WOUND)

## 2022-07-14 LAB — CREATININE, SERUM
Creatinine, Ser: 0.49 mg/dL (ref 0.44–1.00)
GFR, Estimated: >60 mL/min (ref >60)

## 2022-07-14 LAB — GLUCOSE, CAPILLARY
Glucose-Capillary: 295 mg/dL — ABNORMAL HIGH (ref 70–99)
Glucose-Capillary: 226 mg/dL — ABNORMAL HIGH (ref 70–99)
Glucose-Capillary: 284 mg/dL — ABNORMAL HIGH (ref 70–99)

## 2022-07-14 LAB — ACTH STIMULATION, 3 TIME POINTS
Cortisol, 30 Min: 12.9 ug/dL
Cortisol, 60 Min: 16.3 ug/dL
Cortisol, Base: 5 ug/dL

## 2022-07-14 MED ORDER — DOXYCYCLINE HYCLATE 100 MG PO TABS
100.0000 mg | ORAL_TABLET | Freq: Two times a day (BID) | ORAL | Status: DC
  Administered 2022-07-14: 100 mg via ORAL
  Filled 2022-07-14: qty 1

## 2022-07-14 MED ORDER — DEXTROSE 5 % IV SOLN
1500.0000 mg | Freq: Once | INTRAVENOUS | Status: AC
  Administered 2022-07-14: 1500 mg via INTRAVENOUS
  Filled 2022-07-14: qty 75

## 2022-07-14 MED ORDER — INSULIN ASPART 100 UNIT/ML IJ SOLN
0.0000 [IU] | Freq: Three times a day (TID) | INTRAMUSCULAR | Status: DC
  Administered 2022-07-14: 3 [IU] via SUBCUTANEOUS
  Filled 2022-07-14: qty 1

## 2022-07-14 MED ORDER — ASCORBIC ACID 500 MG PO TABS: 500.0000 mg | ORAL_TABLET | Freq: Two times a day (BID) | ORAL | 0 refills | Status: AC

## 2022-07-14 MED ORDER — LANTUS SOLOSTAR 100 UNIT/ML ~~LOC~~ SOPN: 35.0000 [IU] | PEN_INJECTOR | Freq: Every day | SUBCUTANEOUS | 11 refills | Status: DC

## 2022-07-14 MED ORDER — HYDROCODONE-ACETAMINOPHEN 5-325 MG PO TABS: 1.0000 | ORAL_TABLET | Freq: Four times a day (QID) | ORAL | 0 refills | Status: DC | PRN

## 2022-07-14 MED ORDER — CIPROFLOXACIN HCL 500 MG PO TABS
500.0000 mg | ORAL_TABLET | Freq: Two times a day (BID) | ORAL | Status: DC
  Administered 2022-07-14: 500 mg via ORAL
  Filled 2022-07-14: qty 1

## 2022-07-14 MED ORDER — DOXYCYCLINE HYCLATE 100 MG PO TABS: 100.0000 mg | ORAL_TABLET | Freq: Two times a day (BID) | ORAL | 0 refills | Status: AC

## 2022-07-14 MED ORDER — ZINC SULFATE 220 (50 ZN) MG PO CAPS: 220.0000 mg | ORAL_CAPSULE | Freq: Every day | ORAL | 0 refills | Status: AC

## 2022-07-14 MED ORDER — CIPROFLOXACIN HCL 500 MG PO TABS: 500.0000 mg | ORAL_TABLET | Freq: Two times a day (BID) | ORAL | 0 refills | Status: AC

## 2022-07-14 NOTE — Treatment Plan (Signed)
Diagnosis: Chronic osteo of rt foot- MRSA/pseudomonas Baseline Creatinine <1    No Known Allergies  OPAT Orders Discharge antibiotics: Dalbavancin one dose next week - 1500mg  IVPB on 07/21/22 at 1 pm  Doxy 100mg  Po BID X 3 weeks Ciprofloxacin 500mg  PO BID X 3 weeks  Labs on 07/21/22: _X_ CBC with differential __ BMP _X_ CMP _X_ CRP _X_ ESR    Fax weekly lab results  promptly to [phone]  Clinic Follow Up Appt: 07/27/22 at 10.45AM   Call [phone] with any questions

## 2022-07-14 NOTE — Discharge Instructions (Signed)
If need to contact [facility] Day Surgery regarding appointment for IV antibiotic on 07/21/2022, please call [phone].

## 2022-07-14 NOTE — Progress Notes (Signed)
Pharmacy - Antimicrobial Stewardship - Setting up IV antibiotic for [facility] - Oral antibiotics at d/c   Indication: Foot infection/osteomyelitis with MRSA and Pseudomonas   ID plan dalbavancin 1500mg  IV x 1 today and repeat 6/26 at outpatient (infusion to be performed at [facility] Day [facility] at [facility]).  I  contacted Same Day surgery and patient given appointment for 1pm on 07/21/2022 (patient aware and appointment will be on discharge paperwork).  If need to contact Same Day Surgery regarding appointment for IV antibiotic contact number is [phone].   As discussed with Dr Rivka Safer, Dalbavancin 1500mg  IV x 1, CBC, CMP, ESR, CRP orders signed and held for the 6/26 encounter at Same Day Surgery  Discussed doxycycline and ciprofloxacin antibiotics at discharge.  Explain duration of therapy, how to take, common as well as serious side effects to monitor and when to seek medical attention.  Her preferred pharmacy is [facility] in [place] which is listed in [facility].   Juliette Alcide, PharmD, BCPS, BCIDP Work Cell: [phone] 07/14/2022 2:30 PM

## 2022-07-14 NOTE — Progress Notes (Signed)
Date of Admission:  07/05/2022      ID: Beth Carroll is a 52 y.o. female with   Principal Problem:   Diabetic foot infection (HCC) Active Problems:   Hyperglycemia due to type 2 diabetes mellitus (HCC)   Chronic systolic CHF (congestive heart failure) (HCC)   Essential hypertension   PAD (peripheral artery disease) (HCC)   CAD S/P percutaneous coronary angioplasty   GERD without esophagitis   Dyslipidemia   COPD (chronic obstructive pulmonary disease) (HCC)   Atrial fibrillation (HCC)   Malnutrition of moderate degree    Subjective: Feeling better No pain more energy  Medications:   amLODipine  10 mg Oral Daily   apixaban  5 mg Oral BID   vitamin C  500 mg Oral BID   aspirin EC  81 mg Oral Daily   atorvastatin  80 mg Oral Daily   ciprofloxacin  500 mg Oral BID   clopidogrel  75 mg Oral Daily   doxycycline  100 mg Oral Q12H   feeding supplement  1 Container Oral TID BM   furosemide  20 mg Oral Daily   insulin aspart  0-5 Units Subcutaneous QHS   insulin aspart  0-9 Units Subcutaneous TID WC   insulin aspart  8 Units Subcutaneous TID WC   insulin glargine-yfgn  35 Units Subcutaneous Daily   multivitamin with minerals  1 tablet Oral Daily   pantoprazole  40 mg Oral Daily   tiotropium  1 capsule Inhalation Daily   zinc sulfate  220 mg Oral Daily    Objective: Vital signs in last 24 hours: Patient Vitals for the past 24 hrs:  BP Temp Pulse Resp SpO2  07/14/22 0848 118/65 99 F (37.2 C) 85 18 97 %  07/13/22 2249 137/68 98.4 F (36.9 C) 90 16 97 %  07/13/22 1512 97/66 98.3 F (36.8 C) 88 16 100 %     PHYSICAL EXAM:  General: Alert, cooperative, no distress, very pale Lungs: Clear to auscultation bilaterally. No Wheezing or Rhonchi. No rales. Heart: Regular rate and rhythm, no murmur, rub or gallop. Abdomen: Soft, non-tender,not distended. Bowel sounds normal. No masses Extremities: pictures reviewed  Lymph: Cervical, supraclavicular normal. Neurologic:  Grossly non-focal  Lab Results    Latest Ref Rng & Units 07/13/2022    4:26 AM 07/11/2022    5:20 AM 07/10/2022    4:10 AM  CBC  WBC 4.0 - 10.5 K/uL 5.1   6.3   Hemoglobin 12.0 - 15.0 g/dL 7.8  7.5  7.7   Hematocrit 36.0 - 46.0 % 26.6  26.4  26.5   Platelets 150 - 400 K/uL 238   224        Latest Ref Rng & Units 07/14/2022    4:14 AM 07/13/2022    4:26 AM 07/12/2022    4:25 AM  CMP  Glucose 70 - 99 mg/dL  528    BUN 6 - 20 mg/dL  19    Creatinine 4.13 - 1.00 mg/dL 2.44  0.10  2.72   Sodium 135 - 145 mmol/L  134    Potassium 3.5 - 5.1 mmol/L  3.3    Chloride 98 - 111 mmol/L  100    CO2 22 - 32 mmol/L  28    Calcium 8.9 - 10.3 mg/dL  8.3        Microbiology: MRSA in wound culture Pseudomonas  Studies/Results: No results found.   Assessment/Plan: PAD / DM with rt foot infection S/p amputation of 1st  and 2nd toes in [name] 2024 Now presenting with weakness/hypotension No fever or leucocytosis No procal or lactate ESR 48 , CRP < 1 MRI showed edema of the bones of the fore foot And osteo of hallux sesamoids Underwent I/D of infected soft tissue an dbone Removal of sesamoids Portion of bone from 1sta nd 2nd met sent for tests Chronic focal osteomyelitis Culture of wound MRSA Ias wound looks good and well approximated will treat her with Po Doxy 100mg  BID x 3 weeks + Cipro 500mg  Po BID X 3 weeks + 2 weekly doses of 1500mg  Dalbavancin She is getting first dose of Dalba today and next dose in day surgery next Wednesday    Hypotension - doubt it was due to an infection The foot actually looked okay  Cortisol low Cosyntropin test okay   Diabetes mellitus with hyperglycemia- management as per primary team   PAD h/o rt PTA thrombectomy . angiopalsty   Anemia- very low iron    [facility]  Discussed the management with patient /daughterand hospitalist Will follow her as OP on 07/27/22

## 2022-07-14 NOTE — Inpatient Diabetes Management (Signed)
Inpatient Diabetes Program Recommendations  AACE/ADA: New Consensus Statement on Inpatient Glycemic Control (2015)  Target Ranges:  Prepandial:   less than 140 mg/dL      Peak postprandial:   less than 180 mg/dL (1-2 hours)      Critically ill patients:  140 - 180 mg/dL   Lab Results  Component Value Date   GLUCAP 295 (H) 07/14/2022   HGBA1C 9.8 (H) 04/23/2022    Latest Reference Range & Units 07/13/22 07:32 07/13/22 11:38 07/13/22 16:36 07/13/22 22:14 07/14/22 08:46  Glucose-Capillary 70 - 99 mg/dL 098 (H) Novolog 11 units 119 (H) Novolog 8 units 64 (L) Hypoglycemia protocol 344 (H) Novolog 5 units 295 (H) Novolog 8 units  (H): Data is abnormally high (L): Data is abnormally low   Diabetes history: DM2 Outpatient Diabetes medications: Lantus 30 units QHS, Novolog 8 units TID with meals Current orders for Inpatient glycemic control: Semglee 35 units daily, Novolog 0-15 units TID with meals, Novolog 0-5 units QHS, Novolog 8 units TID with meals  Inpatient Diabetes Program Recommendations:   Patient had hypoglycemia post meal coverage + Novolog correction. Please consider: -Decrease Novolog correction to 0-9 units tid, 0-5 units -Decrease Novolog meal coverage to 6 units if additional hypoglycemia occurs.  Thank you, Beth Carroll. Beth Bresnan, RN, MSN, CDE  Diabetes Coordinator Inpatient Glycemic Control Team Team Pager [phone] (8am-5pm) 07/14/2022 9:39 AM

## 2022-07-14 NOTE — Discharge Summary (Signed)
Physician Discharge Summary   Patient: Beth Carroll MRN: [id number]  DOB: [birth year]   Admit:     Date of Admission: 07/05/2022 Admitted from: home   Discharge: Date of discharge: 07/14/22 Disposition: Home health Condition at discharge: good  CODE STATUS: FULL CODE      Discharge Physician: Sunnie Nielsen, DO [facility]     PCP: Gracelyn Nurse, MD  Recommendations for Outpatient Follow-up:  Follow up with PCP Gracelyn Nurse, MD in 1-2 weeks Please obtain labs/tests: CBC, BMP in 1-2 weeks Please follow up on the following pending results: ACTH stimulation testing done 07/14/22     Discharge Instructions     (HEART FAILURE PATIENTS) Call MD:  Anytime you have any of the following symptoms: 1) 3 pound weight gain in 24 hours or 5 pounds in 1 week 2) shortness of breath, with or without a dry hacking cough 3) swelling in the hands, feet or stomach 4) if you have to sleep on extra pillows at night in order to breathe.   Complete by: As directed    Call MD for:  redness, tenderness, or signs of infection (pain, swelling, redness, odor or green/yellow discharge around incision site)   Complete by: As directed    Diet - low sodium heart healthy   Complete by: As directed    Diet Carb Modified   Complete by: As directed    Increase activity slowly   Complete by: As directed          Discharge Diagnoses: Principal Problem: Diabetic foot infection (HCC) - osteomyelitis  Abnormal cortisol level  Active Problems:   Hyperglycemia due to type 2 diabetes mellitus (HCC)   Chronic systolic CHF (congestive heart failure) (HCC)   Essential hypertension   PAD (peripheral artery disease) (HCC)   CAD S/P percutaneous coronary angioplasty   GERD without esophagitis   Dyslipidemia   COPD (chronic obstructive pulmonary disease) (HCC)   Atrial fibrillation (HCC)   Malnutrition of moderate degree       Hospital Course: Beth Carroll is a 52 y.o. female  with medical history significant of multiple medical issues including peripheral artery disease, hyperglycemia with type 2 diabetes, GERD, HFpEF, coronary artery disease presenting to ED 07/05/2022 with right diabetic foot infection. Recent 1st and 2nd R toe amputations 06/03/2022 w/ Dr Wyn Quaker. Seen in follow up 05/31 in vascular office, some drainage and bleeding / serous discharge, advised elevation of limb and also Rx abx. Seen at wound care 06/03 and 06/10. On 06/10, pt reporting weakness/fatigue and noted to have low BP in wound clinic. was sent to ED.  06/10: Possible osteomyelitis. Admitted to hospitalist service.  06/11: Vascular recs for podiatry eval, podiatry plans for I&D but need to be off Plavix and Eliquis so plan for 06/14.  06/12: BCx NGx2d. Wound Cx (+)GPC 06/13: Wound Cx mod staph aureus and few pseudomonas, pend suscept 06/14: abx to cipro. I&D infected soft tissue and bone right forefoot.  today.  06/15: PT/OT recs pending. Surgical path pending. Restart Eliquis + Plavix 06/16: PT recs for HH. Dressing change tomorrow.  06/17: Surgical path still pending. Dr Alberteen Spindle notes good healing and recs for ID consult. 06/18: (+)margins on bone. ID following. Will also f/u on low cortisol levels. 06/19: blood drawn for ACTCH stim test, to follow w/ PCP re: results. Received ID recs re: abx and arrangements made for follow-up.    Consultants:  Vascular surgery  Podiatry   Procedures: 07/09/2022 I&D infected  soft tissue and bone right forefoot     ASSESSMENT & PLAN:   Diabetic foot infection (HCC) Cx(+) mod staph aureus and few pseudomonas 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 Wyn Quaker S/p I&D infected soft tissue and bone right forefoot 07/09/2022 ID consult - final abx plan per med rec   Low cortisol ACTH stimulation test done, pending results - follow w/ PCP     Chronic systolic CHF (congestive heart failure) (HCC) 2D echo February 2024 with EF  of 50 to 55% Appears euvolemic Monitor volume status closely  Anemia Monitor CBC  CAD S/P percutaneous coronary angioplasty No active chest pain Continue home ASA, atorvastatin   COPD (chronic obstructive pulmonary disease) (HCC) Stable, no acute exacerbation  Continue home inhalers  Essential hypertension BP stable to low, holding antihypertensives  Titrate home regimen as needed   GERD without esophagitis PPI  Hyperglycemia due to type 2 diabetes mellitus (HCC) Blood sugars in 300s on presentation Major contributor to wound complications which was discussed with the patient Continue home long-acting insulin - increased dose to 35 units daily   8 units Novolog meal times   PAD (peripheral artery disease) (HCC) Restart ASA/Plavix   Dyslipidemia Continue statin  Abnormal EKG Noted atrial fibrillation on EKG but substantial artifact, difficult to assess P waves, suspect actually NSR  Had stable ECHO 02/2022  Cardiology consult here if decompensates, vs outpatient f/u as clinically indicated.  Telemetry for now appears sinus rhythm               Discharge Instructions  Allergies as of 07/14/2022   No Known Allergies      Medication List     STOP taking these medications    doxycycline 100 MG capsule Commonly known as: VIBRAMYCIN Replaced by: doxycycline 100 MG tablet   oxyCODONE-acetaminophen 5-325 MG tablet Commonly known as: PERCOCET/ROXICET       TAKE these medications    acetaminophen 650 MG CR tablet Commonly known as: TYLENOL Take 650 mg by mouth every 8 (eight) hours as needed for pain.   albuterol 108 (90 Base) MCG/ACT inhaler Commonly known as: VENTOLIN HFA Inhale 2 puffs into the lungs every 6 (six) hours as needed for wheezing or shortness of breath.   apixaban 5 MG Tabs tablet Commonly known as: Eliquis Take 1 tablet (5 mg total) by mouth 2 (two) times daily.   ascorbic acid 500 MG tablet Commonly known as: VITAMIN  C Take 1 tablet (500 mg total) by mouth 2 (two) times daily.   aspirin EC 81 MG tablet Take 81 mg by mouth daily. Swallow whole.   atorvastatin 80 MG tablet Commonly known as: LIPITOR Take 1 tablet (80 mg total) by mouth daily.   barrier cream Crea Commonly known as: non-specified Apply 1 Application topically 2 (two) times daily as needed.   calcium carbonate 500 MG chewable tablet Commonly known as: TUMS - dosed in mg elemental calcium Chew 1 tablet (200 mg of elemental calcium total) by mouth 3 (three) times daily before meals.   CENTRUM SILVER 50+WOMEN PO Take 1 tablet by mouth daily.   ciprofloxacin 500 MG tablet Commonly known as: CIPRO Take 1 tablet (500 mg total) by mouth 2 (two) times daily for 21 days.   clopidogrel 75 MG tablet Commonly known as: PLAVIX Take 1 tablet (75 mg total) by mouth daily.   doxycycline 100 MG tablet Commonly known as: VIBRA-TABS Take 1 tablet (100 mg total) by  mouth every 12 (twelve) hours for 21 days. Replaces: doxycycline 100 MG capsule   feeding supplement Liqd Take 237 mLs by mouth 3 (three) times daily between meals.   nutrition supplement (JUVEN) Pack Take 1 packet by mouth 2 (two) times daily between meals.   furosemide 20 MG tablet Commonly known as: LASIX Take 20 mg by mouth daily.   HYDROcodone-acetaminophen 5-325 MG tablet Commonly known as: NORCO/VICODIN Take 1-2 tablets by mouth every 6 (six) hours as needed for moderate pain or severe pain.   insulin aspart 100 UNIT/ML injection Commonly known as: novoLOG Inject 8 Units into the skin 3 (three) times daily before meals.   Lantus SoloStar 100 UNIT/ML Solostar Pen Generic drug: insulin glargine Inject 35 Units into the skin at bedtime. What changed: how much to take   pantoprazole 40 MG tablet Commonly known as: PROTONIX Take 1 tablet (40 mg total) by mouth daily.   tiotropium 18 MCG inhalation capsule Commonly known as: SPIRIVA Place 1 capsule into inhaler  and inhale daily.   Vitamin D (Ergocalciferol) 1.25 MG (50000 UNIT) Caps capsule Commonly known as: DRISDOL Take 50,000 Units by mouth once a week.   zinc sulfate 220 (50 Zn) MG capsule Take 1 capsule (220 mg total) by mouth daily.               Durable Medical Equipment  (From admission, onward)           Start     Ordered   07/13/22 1519  For home use only DME Other see comment  Once       Comments: PODUS boot, Dx L foot drop  Question:  Length of Need  Answer:  12 Months   07/13/22 1519             Follow-up Information     Gracelyn Nurse, MD. Schedule an appointment as soon as possible for a visit.   Specialty: Internal Medicine Why: hospital follow-up in 1-2 weeks, review results of ACTH stimulation test done on 07/14/2022, follow up on osteomyelitis Contact information: [address] [place] [address] [phone]         Linus Galas, DPM. Schedule an appointment as soon as possible for a visit.   Specialty: Podiatry Why: follow up next week in office Contact information: [address] [place] [address] [phone]                 No Known Allergies   Subjective: pt feeling well this morning, no CP/SOB, no HA/VC, no N/V, pain is controlled    Discharge Exam: BP 118/65 (BP Location: Right Arm)   Pulse 85   Temp 99 F (37.2 C)   Resp 18   Ht 5\' 6"  (1.676 m)   Wt 53.5 kg   LMP 01/26/2015   SpO2 97%   BMI 19.05 kg/m  General: Pt is alert, awake, not in acute distress Cardiovascular: RRR, S1/S2 +, no rubs, no gallops Respiratory: CTA bilaterally, no wheezing, no rhonchi Abdominal: Soft, NT, ND Extremities: +1 edema bilateral, has been stable. R bandage was not distubed following re-dressing this morning w/ podiatry - see photo below        The results of significant diagnostics from this hospitalization (including imaging, microbiology, ancillary and laboratory) are listed below for reference.      Microbiology: Recent Results (from the past 240 hour(s))  Blood culture (routine x 2)     Status: None   Collection Time: 07/05/22  2:04 PM   Specimen: BLOOD  Result Value Ref Range Status   Specimen Description BLOOD BLOOD RIGHT FOREARM  Final   Special Requests   Final    BOTTLES DRAWN AEROBIC AND ANAEROBIC Blood Culture adequate volume   Culture   Final    NO GROWTH 5 DAYS Performed at [facility], [address], [place], [place] [address]    Report Status 07/10/2022 FINAL  Final  Blood culture (routine x 2)     Status: None   Collection Time: 07/05/22  2:04 PM   Specimen: BLOOD  Result Value Ref Range Status   Specimen Description BLOOD BLOOD RIGHT ARM  Final   Special Requests   Final    BOTTLES DRAWN AEROBIC AND ANAEROBIC Blood Culture results may not be optimal due to an excessive volume of blood received in culture bottles   Culture   Final    NO GROWTH 5 DAYS Performed at [facility], [address] [place], [place] [address]    Report Status 07/10/2022 FINAL  Final  Aerobic/Anaerobic Culture w Gram Stain (surgical/deep wound)     Status: None   Collection Time: 07/05/22  3:40 PM   Specimen: Foot  Result Value Ref Range Status   Specimen Description   Final    FOOT Performed at [facility], [address] [place], [place] [address]    Special Requests   Final    RF Performed at [facility], [address] [place], [place] [address]    Gram Stain   Final    RARE WBC PRESENT, PREDOMINANTLY MONONUCLEAR RARE GRAM POSITIVE COCCI IN SINGLES    Culture   Final    MODERATE METHICILLIN RESISTANT STAPHYLOCOCCUS AUREUS FEW PSEUDOMONAS AERUGINOSA NO ANAEROBES ISOLATED Performed at [facility], [address] [place], [place] [address]    Report Status 07/11/2022 FINAL  Final   Organism ID, Bacteria METHICILLIN RESISTANT STAPHYLOCOCCUS AUREUS  Final   Organism ID, Bacteria PSEUDOMONAS AERUGINOSA  Final       Susceptibility   Methicillin resistant staphylococcus aureus - MIC*    CIPROFLOXACIN >=8 RESISTANT Resistant     ERYTHROMYCIN >=8 RESISTANT Resistant     GENTAMICIN <=0.5 SENSITIVE Sensitive     OXACILLIN >=4 RESISTANT Resistant     TETRACYCLINE <=1 SENSITIVE Sensitive     VANCOMYCIN <=0.5 SENSITIVE Sensitive     TRIMETH/SULFA >=320 RESISTANT Resistant     CLINDAMYCIN <=0.25 SENSITIVE Sensitive     RIFAMPIN <=0.5 SENSITIVE Sensitive     Inducible Clindamycin NEGATIVE Sensitive     LINEZOLID 2 SENSITIVE Sensitive     * MODERATE METHICILLIN RESISTANT STAPHYLOCOCCUS AUREUS   Pseudomonas aeruginosa - MIC*    CEFTAZIDIME >=64 RESISTANT Resistant     CIPROFLOXACIN <=0.25 SENSITIVE Sensitive     GENTAMICIN <=1 SENSITIVE Sensitive     IMIPENEM 1 SENSITIVE Sensitive     PIP/TAZO >=128 RESISTANT Resistant     * FEW PSEUDOMONAS AERUGINOSA  Aerobic/Anaerobic Culture w Gram Stain (surgical/deep wound)     Status: None   Collection Time: 07/09/22  4:45 PM   Specimen: Foot, Left; Tissue  Result Value Ref Range Status   Specimen Description   Final    WOUND Performed at [facility], [address] [place], [place] [address]    Special Requests LEFT FOOT  Final   Gram Stain NO WBC SEEN NO ORGANISMS SEEN   Final   Culture   Final    FEW  METHICILLIN RESISTANT STAPHYLOCOCCUS AUREUS NO ANAEROBES ISOLATED Performed at [facility], [address] [place], [place] [address]    Report Status 07/14/2022 FINAL  Final   Organism ID, Bacteria METHICILLIN RESISTANT STAPHYLOCOCCUS AUREUS  Final      Susceptibility   Methicillin resistant staphylococcus aureus - MIC*    CIPROFLOXACIN >=8 RESISTANT Resistant     ERYTHROMYCIN >=8 RESISTANT Resistant     GENTAMICIN <=0.5 SENSITIVE Sensitive     OXACILLIN >=4 RESISTANT Resistant     TETRACYCLINE <=1 SENSITIVE Sensitive     VANCOMYCIN 1 SENSITIVE Sensitive     TRIMETH/SULFA >=320 RESISTANT Resistant     CLINDAMYCIN <=0.25 SENSITIVE  Sensitive     RIFAMPIN <=0.5 SENSITIVE Sensitive     Inducible Clindamycin NEGATIVE Sensitive     LINEZOLID 2 SENSITIVE Sensitive     * FEW METHICILLIN RESISTANT STAPHYLOCOCCUS AUREUS     Labs: BNP (last 3 results) Recent Labs    12/10/21 0158  BNP 311.6*   Basic Metabolic Panel: Recent Labs  Lab 07/08/22 0357 07/09/22 0317 07/10/22 0410 07/12/22 0425 07/13/22 0426 07/14/22 0414  NA 136  --  135  --  134*  --   K 3.5  --  3.5  --  3.3*  --   CL 107  --  104  --  100  --   CO2 24  --  24  --  28  --   GLUCOSE 321*  --  486*  --  172*  --   BUN 24*  --  19  --  19  --   CREATININE 0.53 0.51 0.59 0.39* 0.39* 0.49  CALCIUM 8.6*  --  8.3*  --  8.3*  --    Liver Function Tests: No results for input(s): "AST", "ALT", "ALKPHOS", "BILITOT", "PROT", "ALBUMIN" in the last 168 hours. No results for input(s): "LIPASE", "AMYLASE" in the last 168 hours. No results for input(s): "AMMONIA" in the last 168 hours. CBC: Recent Labs  Lab 07/08/22 0357 07/09/22 0317 07/10/22 0410 07/11/22 0520 07/13/22 0426  WBC 4.5  --  6.3  --  5.1  HGB 7.6* 8.2* 7.7* 7.5* 7.8*  HCT 26.4* 28.1* 26.5* 26.4* 26.6*  MCV 79.5*  --  80.3  --  79.4*  PLT 214  --  224  --  238   Cardiac Enzymes: No results for input(s): "CKTOTAL", "CKMB", "CKMBINDEX", "TROPONINI" in the last 168 hours. BNP: Invalid input(s): "POCBNP" CBG: Recent Labs  Lab 07/13/22 1138 07/13/22 1636 07/13/22 2214 07/14/22 0846 07/14/22 1212  GLUCAP 119* 64* 344* 295* 226*   D-Dimer No results for input(s): "DDIMER" in the last 72 hours. Hgb A1c No results for input(s): "HGBA1C" in the last 72 hours. Lipid Profile No results for input(s): "CHOL", "HDL", "LDLCALC", "TRIG", "CHOLHDL", "LDLDIRECT" in the last 72 hours. Thyroid function studies No results for input(s): "TSH", "T4TOTAL", "T3FREE", "THYROIDAB" in the last 72 hours.  Invalid input(s): "FREET3" Anemia work up Recent Labs    07/12/22 2218 07/13/22 0426   VITAMINB12  --  374  FOLATE  --  12.8  FERRITIN  --  8*  TIBC 316  --   IRON 18*  --    Urinalysis    Component Value Date/Time   COLORURINE YELLOW (A) 06/01/2022 1800   APPEARANCEUR HAZY (A) 06/01/2022 1800   APPEARANCEUR Clear 08/30/2012 0047   LABSPEC 1.008 06/01/2022 1800   LABSPEC 1.014 08/30/2012 0047   PHURINE 7.0 06/01/2022 1800   GLUCOSEU >=500 (  A) 06/01/2022 1800   GLUCOSEU >=500 08/30/2012 0047   HGBUR MODERATE (A) 06/01/2022 1800   BILIRUBINUR NEGATIVE 06/01/2022 1800   BILIRUBINUR Negative 08/30/2012 0047   KETONESUR NEGATIVE 06/01/2022 1800   PROTEINUR NEGATIVE 06/01/2022 1800   NITRITE NEGATIVE 06/01/2022 1800   LEUKOCYTESUR LARGE (A) 06/01/2022 1800   LEUKOCYTESUR 1+ 08/30/2012 0047   Sepsis Labs Recent Labs  Lab 07/08/22 0357 07/10/22 0410 07/13/22 0426  WBC 4.5 6.3 5.1   Microbiology Recent Results (from the past 240 hour(s))  Blood culture (routine x 2)     Status: None   Collection Time: 07/05/22  2:04 PM   Specimen: BLOOD  Result Value Ref Range Status   Specimen Description BLOOD BLOOD RIGHT FOREARM  Final   Special Requests   Final    BOTTLES DRAWN AEROBIC AND ANAEROBIC Blood Culture adequate volume   Culture   Final    NO GROWTH 5 DAYS Performed at [facility], [address] [place], [place] [address]    Report Status 07/10/2022 FINAL  Final  Blood culture (routine x 2)     Status: None   Collection Time: 07/05/22  2:04 PM   Specimen: BLOOD  Result Value Ref Range Status   Specimen Description BLOOD BLOOD RIGHT ARM  Final   Special Requests   Final    BOTTLES DRAWN AEROBIC AND ANAEROBIC Blood Culture results may not be optimal due to an excessive volume of blood received in culture bottles   Culture   Final    NO GROWTH 5 DAYS Performed at [facility], [address], [place], [place] [address]    Report Status 07/10/2022 FINAL  Final  Aerobic/Anaerobic Culture w Gram Stain (surgical/deep wound)     Status:  None   Collection Time: 07/05/22  3:40 PM   Specimen: Foot  Result Value Ref Range Status   Specimen Description   Final    FOOT Performed at [facility], [address] [place], [place] [address]    Special Requests   Final    RF Performed at [facility], [address] [place], [place] [address]    Gram Stain   Final    RARE WBC PRESENT, PREDOMINANTLY MONONUCLEAR RARE GRAM POSITIVE COCCI IN SINGLES    Culture   Final    MODERATE METHICILLIN RESISTANT STAPHYLOCOCCUS AUREUS FEW PSEUDOMONAS AERUGINOSA NO ANAEROBES ISOLATED Performed at [facility], [address] [place], [place] [address]    Report Status 07/11/2022 FINAL  Final   Organism ID, Bacteria METHICILLIN RESISTANT STAPHYLOCOCCUS AUREUS  Final   Organism ID, Bacteria PSEUDOMONAS AERUGINOSA  Final      Susceptibility   Methicillin resistant staphylococcus aureus - MIC*    CIPROFLOXACIN >=8 RESISTANT Resistant     ERYTHROMYCIN >=8 RESISTANT Resistant     GENTAMICIN <=0.5 SENSITIVE Sensitive     OXACILLIN >=4 RESISTANT Resistant     TETRACYCLINE <=1 SENSITIVE Sensitive     VANCOMYCIN <=0.5 SENSITIVE Sensitive     TRIMETH/SULFA >=320 RESISTANT Resistant     CLINDAMYCIN <=0.25 SENSITIVE Sensitive     RIFAMPIN <=0.5 SENSITIVE Sensitive     Inducible Clindamycin NEGATIVE Sensitive     LINEZOLID 2 SENSITIVE Sensitive     * MODERATE METHICILLIN RESISTANT STAPHYLOCOCCUS AUREUS   Pseudomonas aeruginosa - MIC*    CEFTAZIDIME >=64 RESISTANT Resistant     CIPROFLOXACIN <=0.25 SENSITIVE Sensitive     GENTAMICIN <=1 SENSITIVE Sensitive     IMIPENEM 1 SENSITIVE  Sensitive     PIP/TAZO >=128 RESISTANT Resistant     * FEW PSEUDOMONAS AERUGINOSA  Aerobic/Anaerobic Culture w Gram Stain (surgical/deep wound)     Status: None   Collection Time: 07/09/22  4:45 PM   Specimen: Foot, Left; Tissue  Result Value Ref Range Status   Specimen Description   Final    WOUND Performed at [facility],  [address] [place], [place] [address]    Special Requests LEFT FOOT  Final   Gram Stain NO WBC SEEN NO ORGANISMS SEEN   Final   Culture   Final    FEW METHICILLIN RESISTANT STAPHYLOCOCCUS AUREUS NO ANAEROBES ISOLATED Performed at [facility], [address] [place], [place] [address]    Report Status 07/14/2022 FINAL  Final   Organism ID, Bacteria METHICILLIN RESISTANT STAPHYLOCOCCUS AUREUS  Final      Susceptibility   Methicillin resistant staphylococcus aureus - MIC*    CIPROFLOXACIN >=8 RESISTANT Resistant     ERYTHROMYCIN >=8 RESISTANT Resistant     GENTAMICIN <=0.5 SENSITIVE Sensitive     OXACILLIN >=4 RESISTANT Resistant     TETRACYCLINE <=1 SENSITIVE Sensitive     VANCOMYCIN 1 SENSITIVE Sensitive     TRIMETH/SULFA >=320 RESISTANT Resistant     CLINDAMYCIN <=0.25 SENSITIVE Sensitive     RIFAMPIN <=0.5 SENSITIVE Sensitive     Inducible Clindamycin NEGATIVE Sensitive     LINEZOLID 2 SENSITIVE Sensitive     * FEW METHICILLIN RESISTANT STAPHYLOCOCCUS AUREUS   Imaging MR FOOT RIGHT W WO CONTRAST  Result Date: 07/06/2022 CLINICAL DATA:  Soft tissue infection suspected, foot, xray done rule out osteomyelitis EXAM: MRI OF THE RIGHT FOREFOOT WITHOUT AND WITH CONTRAST TECHNIQUE: Multiplanar, multisequence MR imaging of the right forefoot was performed before and after the administration of intravenous contrast. CONTRAST:  5mL GADAVIST GADOBUTROL 1 MMOL/ML IV SOLN COMPARISON:  X-ray 07/05/2022 FINDINGS: Bones/Joint/Cartilage Prior amputations of the great toe and second toe. Intense bone marrow edema and confluent low T1 signal changes within the residual first and second metatarsal heads and necks compatible with acute osteomyelitis. There is also osteomyelitis within the hallux sesamoids, more pronounced within the tibial hallux sesamoid. Patchy bone marrow edema throughout the remaining bones of the forefoot may be due to a combination of demineralization, stress related  changes, and reactive osteitis. More serpiginous marrow signal alterations within the anterior calcaneus, navicular, and first metatarsal base could represent areas of bone infarction or developing nondisplaced fractures. Ligaments Intact Lisfranc ligament.  No acute collateral ligament injury. Muscles and Tendons Appearance of the foot musculature suggest a combination of denervation and myositis. Amputation changes of the flexor and extensor tendons of the first and second toes. No well-defined tenosynovial fluid collection. Soft tissues Marked diffuse soft tissue swelling of the foot. No organized or drainable fluid collections. Foci of susceptibility within the soft tissues at the distal amputation margins may be postsurgical or represent a small amount of soft tissue gas. IMPRESSION: 1. Prior amputations of the great toe and second toe. Acute osteomyelitis of the residual first and second metatarsal heads and necks. 2. Osteomyelitis of the hallux sesamoids, more pronounced within the tibial hallux sesamoid. 3. Patchy bone marrow edema throughout the remaining bones of the forefoot may be due to a combination of demineralization, stress-related changes, and reactive osteitis. More serpiginous marrow signal alterations within the anterior calcaneus, navicular, and first metatarsal base could represent areas of bone infarction or developing nondisplaced fractures. 4. Marked diffuse soft tissue  swelling of the foot. No organized or drainable fluid collections. 5. Foci of susceptibility within the soft tissues at the distal amputation margins may be postsurgical or represent a small amount of soft tissue gas. Electronically Signed   By: Duanne Guess D.O.   On: 07/06/2022 10:08   DG Chest 2 View  Result Date: 07/05/2022 CLINICAL DATA:  Weakness and hypotension.  Possible sepsis. EXAM: CHEST - 2 VIEW COMPARISON:  Chest radiograph 06/01/2022 FINDINGS: The cardiac silhouette is upper limits of normal in size.  Aortic atherosclerosis is noted. The lungs are hyperinflated with mild interstitial coarsening. No airspace consolidation, edema, pleural effusion, or pneumothorax is identified. Right upper quadrant abdominal surgical clips are noted. No acute osseous abnormality is seen. IMPRESSION: No active cardiopulmonary disease. Electronically Signed   By: Sebastian Ache M.D.   On: 07/05/2022 16:21   DG Foot Complete Right  Result Date: 07/05/2022 CLINICAL DATA:  Wound care.  Weakness and fatigue. EXAM: RIGHT FOOT COMPLETE - 3+ VIEW COMPARISON:  None Available. FINDINGS: The bones are osteopenic. There has been prior amputations of the first and second metatarsal heads and phalanges. There is some questionable minimal cortical erosions along the region of the second metatarsal neck. There is overlying soft tissue swelling. There is no acute fracture or dislocation identified. Plantar calcaneal spur is present. There is diffuse soft tissue swelling of the foot. IMPRESSION: 1. Prior amputations of the first and second metatarsal heads and phalanges. 2. Questionable minimal cortical erosions along the region of the second metatarsal neck with overlying soft tissue swelling. Osteomyelitis cannot be excluded. 3. Diffuse soft tissue swelling. Electronically Signed   By: Darliss Cheney M.D.   On: 07/05/2022 15:54      Time coordinating discharge: over 30 minutes  SIGNED:  Sunnie Nielsen DO [facility]

## 2022-07-14 NOTE — Progress Notes (Signed)
5 Days Post-Op   Subjective/Chief Complaint: Patient seen.  No complaints.   Objective: Vital signs in last 24 hours: Temp:  [98.3 F (36.8 C)-98.4 F (36.9 C)] 98.4 F (36.9 C) (06/18 2249) Pulse Rate:  [88-90] 90 (06/18 2249) Resp:  [16] 16 (06/18 2249) BP: (97-137)/(66-68) 137/68 (06/18 2249) SpO2:  [97 %-100 %] 97 % (06/18 2249) Last BM Date : 07/13/22  Intake/Output from previous day: 06/18 0701 - 06/19 0700 In: -  Out: 1400 [Urine:1400] Intake/Output this shift: No intake/output data recorded.  Bandage on the right foot is dry and intact.  Upon removal there is only minimal bleeding and drainage.  Incision is well coapted with no erythema.  Lab Results:  Recent Labs    07/13/22 0426  WBC 5.1  HGB 7.8*  HCT 26.6*  PLT 238   BMET Recent Labs    07/13/22 0426 07/14/22 0414  NA 134*  --   K 3.3*  --   CL 100  --   CO2 28  --   GLUCOSE 172*  --   BUN 19  --   CREATININE 0.39* 0.49  CALCIUM 8.3*  --    PT/INR No results for input(s): "LABPROT", "INR" in the last 72 hours. ABG No results for input(s): "PHART", "HCO3" in the last 72 hours.  Invalid input(s): "PCO2", "PO2"  Studies/Results: No results found.  Anti-infectives: Anti-infectives (From admission, onward)    Start     Dose/Rate Route Frequency Ordered Stop   07/09/22 1200  vancomycin (VANCOCIN) IVPB 1000 mg/200 mL premix        1,000 mg 200 mL/hr over 60 Minutes Intravenous Every 12 hours 07/09/22 1049     07/09/22 1000  ciprofloxacin (CIPRO) IVPB 400 mg        400 mg 200 mL/hr over 60 Minutes Intravenous Every 12 hours 07/09/22 0903     07/06/22 1000  vancomycin (VANCOREADY) IVPB 1250 mg/250 mL  Status:  Discontinued        1,250 mg 166.7 mL/hr over 90 Minutes Intravenous Every 24 hours 07/05/22 1922 07/09/22 1048   07/05/22 2200  ceFEPIme (MAXIPIME) 2 g in sodium chloride 0.9 % 100 mL IVPB  Status:  Discontinued        2 g 200 mL/hr over 30 Minutes Intravenous Every 8 hours 07/05/22  1922 07/09/22 0902   07/05/22 1745  metroNIDAZOLE (FLAGYL) IVPB 500 mg        500 mg 100 mL/hr over 60 Minutes Intravenous Every 12 hours 07/05/22 1731 07/12/22 1744   07/05/22 1615  vancomycin (VANCOCIN) IVPB 1000 mg/200 mL premix        1,000 mg 200 mL/hr over 60 Minutes Intravenous  Once 07/05/22 1609 07/05/22 1822   07/05/22 1615  piperacillin-tazobactam (ZOSYN) IVPB 3.375 g        3.375 g 100 mL/hr over 30 Minutes Intravenous  Once 07/05/22 1609 07/05/22 1721       Assessment/Plan: s/p Procedure(s): IRRIGATION AND DEBRIDEMENT WOUND (Right) Assessment: Stable status post debridement right foot.  Plan: Betadine gauze applied to the incision followed by a sterile dressing.  Patient should be stable for discharge from podiatry standpoint.  Follow-up next week outpatient.  Podiatry will sign off for now.  LOS: 9 days    Ricci Barker 07/14/2022

## 2022-07-14 NOTE — TOC Transition Note (Signed)
Transition of Care [facility]) - CM/SW Discharge Note   Patient Details  Name: Beth Carroll MRN: [id number] Date of Birth: 1970/10/05  [facility] CM/SW Contact:  Garret Reddish, RN Phone Number: 07/14/2022, 1:53 PM   Clinical Narrative:   Chart reviewed.  Patient will be a discharge for today.    I have verified that patient will be going home on po antibiotics for treatment of chronic osteo of rt foot- MRSA/pseudomonas.  Patient will also be receiving IV Dalbavancin as an outpatient.  She has and appointment on June 26th 2024 at  1 pm at [facility] to receive a does of the Dalbavancin.  Same Day surgery contact number is [phone].    I have informed Beth Carroll with [facility] that patient will be a discharge home for today.  [facility] will provide home health PT, RN, OT and in home aid.    Beth Carroll has requested ambulance transport home. I have arranged ambulance transport home for today via [facility].    Staff nurse has informed me that patient's brace/Podus Boot has been delivered to the bedside.    I have informed staff nurse that [facility] will transport patient home today.     Final next level of care: Home w Home Health Services Barriers to Discharge: No Barriers Identified   Patient Goals and CMS Choice CMS Medicare.gov Compare Post Acute Care list provided to:: Patient (Patient active with [facility] and would like to continue to use their services.) Choice offered to / list presented to : Patient  Discharge Placement                      Patient and family notified of of transfer: 07/14/22  Discharge Plan and Services Additional resources added to the After Visit Summary for     Discharge Planning Services: CM Consult                      HH Arranged: RN, PT, OT, Nurse's Aide HH Agency: [facility] Date [facility] Agency Contacted: 07/14/22 Time HH Agency Contacted: 1000 Representative spoke  with at [facility] Agency: Beth Carroll  Social Determinants of Health (SDOH) Interventions SDOH Screenings   Food Insecurity: No Food Insecurity (07/05/2022)  Housing: Low Risk  (07/05/2022)  Transportation Needs: No Transportation Needs (07/05/2022)  Utilities: Not At Risk (07/05/2022)  Depression (PHQ2-9): Low Risk  (04/13/2021)  Tobacco Use: Medium Risk (07/12/2022)     Readmission Risk Interventions     No data to display

## 2022-07-15 LAB — ACTH: C206 ACTH: 13.4 pg/mL (ref 7.2–63.3)

## 2022-07-21 ENCOUNTER — Ambulatory Visit: Payer: Medicaid Other

## 2022-07-21 ENCOUNTER — Ambulatory Visit: Admit: 2022-07-21 | Discharge: 2022-07-21 | Disposition: A | Discharge status: Home / Self Care | Payer: Medicaid Other

## 2022-07-21 VITALS — BP 136/69 | HR 74 | Temp 98.0°F | Resp 15

## 2022-07-21 DIAGNOSIS — L089 Local infection of the skin and subcutaneous tissue, unspecified: Secondary | ICD-10-CM

## 2022-07-21 LAB — CBC
HCT: 27.9 % — ABNORMAL LOW (ref 36.0–46.0)
Hemoglobin: 8.1 g/dL — ABNORMAL LOW (ref 12.0–15.0)
MCH: 23.1 pg — ABNORMAL LOW (ref 26.0–34.0)
MCHC: 29 g/dL — ABNORMAL LOW (ref 30.0–36.0)
MCV: 79.5 fL — ABNORMAL LOW (ref 80.0–100.0)
Platelets: 336 10*3/uL (ref 150–400)
RBC: 3.51 MIL/uL — ABNORMAL LOW (ref 3.87–5.11)
RDW: 16.2 % — ABNORMAL HIGH (ref 11.5–15.5)
WBC: 4.8 10*3/uL (ref 4.0–10.5)
nRBC: 0 % (ref 0.0–0.2)

## 2022-07-21 LAB — COMPREHENSIVE METABOLIC PANEL
ALT: 23 U/L (ref 0–44)
AST: 17 U/L (ref 15–41)
Albumin: 2.8 g/dL — ABNORMAL LOW (ref 3.5–5.0)
Alkaline Phosphatase: 118 U/L (ref 38–126)
Anion gap: 7 (ref 5–15)
BUN: 12 mg/dL (ref 6–20)
CO2: 27 mmol/L (ref 22–32)
Calcium: 8.3 mg/dL — ABNORMAL LOW (ref 8.9–10.3)
Chloride: 102 mmol/L (ref 98–111)
Creatinine, Ser: 0.42 mg/dL — ABNORMAL LOW (ref 0.44–1.00)
GFR, Estimated: >60 mL/min (ref >60)
Glucose, Bld: 150 mg/dL — ABNORMAL HIGH (ref 70–99)
Potassium: 3.2 mmol/L — ABNORMAL LOW (ref 3.5–5.1)
Sodium: 136 mmol/L (ref 135–145)
Total Bilirubin: 0.4 mg/dL (ref 0.3–1.2)
Total Protein: 6.5 g/dL (ref 6.5–8.1)

## 2022-07-21 LAB — C-REACTIVE PROTEIN: CRP: 0.6 mg/dL (ref <1.0)

## 2022-07-21 LAB — SEDIMENTATION RATE: Sed Rate: 63 mm/hr — ABNORMAL HIGH (ref 0–30)

## 2022-07-21 MED ORDER — DEXTROSE 5 % IV SOLN
1500.0000 mg | Freq: Once | INTRAVENOUS | Status: AC
  Administered 2022-07-21: 1500 mg via INTRAVENOUS
  Filled 2022-07-21: qty 75

## 2022-07-22 ENCOUNTER — Inpatient Hospital Stay: Payer: Medicaid Other | Admitting: Infectious Diseases

## 2022-07-27 ENCOUNTER — Encounter: Payer: Self-pay | Admitting: Infectious Diseases

## 2022-07-27 ENCOUNTER — Ambulatory Visit: Payer: Medicaid Other | Attending: Infectious Diseases | Admitting: Infectious Diseases

## 2022-07-27 VITALS — BP 88/60 | HR 76 | Temp 96.5°F

## 2022-07-27 DIAGNOSIS — Z794 Long term (current) use of insulin: Secondary | ICD-10-CM

## 2022-07-27 DIAGNOSIS — E11628 Type 2 diabetes mellitus with other skin complications: Secondary | ICD-10-CM | POA: Diagnosis not present

## 2022-07-27 DIAGNOSIS — Z89421 Acquired absence of other right toe(s): Secondary | ICD-10-CM | POA: Insufficient documentation

## 2022-07-27 DIAGNOSIS — E1152 Type 2 diabetes mellitus with diabetic peripheral angiopathy with gangrene: Secondary | ICD-10-CM

## 2022-07-27 DIAGNOSIS — L089 Local infection of the skin and subcutaneous tissue, unspecified: Secondary | ICD-10-CM

## 2022-07-27 DIAGNOSIS — I5022 Chronic systolic (congestive) heart failure: Secondary | ICD-10-CM | POA: Diagnosis not present

## 2022-07-27 DIAGNOSIS — I110 Hypertensive heart disease with heart failure: Secondary | ICD-10-CM | POA: Insufficient documentation

## 2022-07-27 DIAGNOSIS — D649 Anemia, unspecified: Secondary | ICD-10-CM | POA: Diagnosis not present

## 2022-07-27 DIAGNOSIS — E1151 Type 2 diabetes mellitus with diabetic peripheral angiopathy without gangrene: Secondary | ICD-10-CM | POA: Insufficient documentation

## 2022-07-27 NOTE — Progress Notes (Signed)
NAME: Beth Carroll  DOB: 09-Mar-1970  MRN: [id number]  Date/Time: 07/27/2022 11:13 AM  Patient is here with her daughter.  Here for follow-up after recent hospitalization at [facility] between 07/05/2022 until 07/14/2022 for r right foot infection for which she earlier had undergone amputation of the first and second toes in May 2024 and during this hospitalization underwent I&D of the infected soft tissue and removal of the sesamoids.  The culture was positive for MRSA as well as Pseudomonas.  And the pathology showed chronic focal osteomyelitis.  Patient was given IV antibiotics while in the hospital for nearly 10 days and then got 2 weekly doses of dalbavancin the last 1 on 07/21/2022.  She is also taking p.o. doxycycline 100 mg twice daily and ciprofloxacin 500 mg twice daily for 3 weeks and will complete on 08/04/2022.  She is doing much better She has not seen Dr. Alberteen Spindle the podiatrist since her discharge  Beth Carroll is a 52 y.o. 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  On 04/24/2022 she underwent right percutaneous angioplasty and thrombectomy of the right PTA.  She was discharged on Plavix Eliquis and ASA.  On 06/03/2022 she underwent amputation of the first and second toe for gangreneOf the toes.  Past Medical History:  Diagnosis Date   Acute deep vein thrombosis (DVT) of right lower extremity (HCC)    ADHD (attention deficit hyperactivity disorder)    AKI (acute kidney injury) (HCC) 04/30/2022   Aortic atherosclerosis (HCC)    COPD (chronic obstructive pulmonary disease) (HCC)    Coronary artery disease 12/15/2020   a.) LHC/PCI 12/15/2020: 99% mLAD (2.25 x 18 mm Onyx Frontier DES), 90% RI (2.75 x 22 mm Onyx Frontier DES), 40% m-dLAD   Diabetes mellitus without complication (HCC)    Diabetes mellitus, type 2 (HCC)    GERD (gastroesophageal  reflux disease)    HFrEF (heart failure with reduced ejection fraction) (HCC)    a.) TTE 12/10/2020: EF 40-45%, apical/periapical HK, G2DD; b.) LHC 12/15/2020: EF 35-45%; c.) TTE 06/26/2021: EF 40-45%, LVH, G2DD; d.) TTE 03/21/2022: EF 55-60%, LV/apical sep/ant wall HK, Triv MR, AoV sclerosis   High anion gap metabolic acidosis 04/30/2022   Hyperlipemia    Hypertension    Hypophosphatemia 04/30/2022   Ischemic cardiomyopathy    a.) TTE 12/10/2020: EF 40-45%; b.) LHC 12/15/2020: EF 35-45%; c.) TTE 06/26/2021: EF 40-45%; d.) TTE 03/21/2022: EF 55-60%   Meningioma (HCC)    a.) CT head and MRI brain 08/21/2021: LEFT parietal meningioma   Myocardial infarction (HCC)    Nausea vomiting and diarrhea 04/30/2022   Orthostatic hypotension    PAD (peripheral artery disease) (HCC)    Sepsis secondary to UTI (HCC) 04/30/2022   Tobacco use     Past Surgical History:  Procedure Laterality Date   AMPUTATION Right 06/03/2022   Procedure: AMPUTATION RAY ( 1ST AND 2ND TOE);  Surgeon: Annice Needy, MD;  Location: [facility] ORS;  Service: Vascular;  Laterality: Right;   CHOLECYSTECTOMY     CORONARY STENT INTERVENTION N/A 12/15/2020   Procedure: CORONARY STENT INTERVENTION;  Surgeon: Iran Ouch, MD;  Location: [facility] INVASIVE CV LAB;  Service: Cardiovascular;  Laterality: N/A;   INCISION AND DRAINAGE OF WOUND Right 07/09/2022   Procedure: IRRIGATION AND DEBRIDEMENT WOUND;  Surgeon: Linus Galas, DPM;  Location: [facility] ORS;  Service: Podiatry;  Laterality:  Right;   LEFT HEART CATH AND CORONARY ANGIOGRAPHY N/A 12/15/2020   Procedure: LEFT HEART CATH AND CORONARY ANGIOGRAPHY;  Surgeon: Iran Ouch, MD;  Location: [facility] INVASIVE CV LAB;  Service: Cardiovascular;  Laterality: N/A;   LOWER EXTREMITY ANGIOGRAPHY Left 12/10/2020   Procedure: Lower Extremity Angiography;  Surgeon: Annice Needy, MD;  Location: [facility] INVASIVE CV LAB;  Service: Cardiovascular;  Laterality: Left;   LOWER EXTREMITY ANGIOGRAPHY Right  12/25/2020   Procedure: LOWER EXTREMITY ANGIOGRAPHY;  Surgeon: Annice Needy, MD;  Location: [facility] INVASIVE CV LAB;  Service: Cardiovascular;  Laterality: Right;   LOWER EXTREMITY ANGIOGRAPHY Right 08/19/2021   Procedure: Lower Extremity Angiography;  Surgeon: Annice Needy, MD;  Location: [facility] INVASIVE CV LAB;  Service: Cardiovascular;  Laterality: Right;   LOWER EXTREMITY ANGIOGRAPHY Right 08/19/2021   Procedure: Lower Extremity Angiography;  Surgeon: Annice Needy, MD;  Location: [facility] INVASIVE CV LAB;  Service: Cardiovascular;  Laterality: Right;   LOWER EXTREMITY ANGIOGRAPHY Right 04/24/2022   Procedure: Lower Extremity Angiography;  Surgeon: Learta Codding, MD;  Location: [facility] INVASIVE CV LAB;  Service: Cardiovascular;  Laterality: Right;   ORIF FEMUR FRACTURE Right 12/14/2021   Procedure: OPEN REDUCTION INTERNAL FIXATION RIGHT DISTAL FEMUR;  Surgeon: Roby Lofts, MD;  Location: [facility] OR;  Service: Orthopedics;  Laterality: Right;    Social History   Socioeconomic History   Marital status: Single    Spouse name: Not on file   Number of children: Not on file   Years of education: Not on file   Highest education level: Not on file  Occupational History   Not on file  Tobacco Use   Smoking status: Former    Types: Cigarettes    Quit date: 12/09/2020    Years since quitting: 1.6   Smokeless tobacco: Never  Vaping Use   Vaping Use: Never used  Substance and Sexual Activity   Alcohol use: Never   Drug use: Not on file   Sexual activity: Not on file  Other Topics Concern   Not on file  Social History Narrative   Not on file   Social Determinants of Health   Financial Resource Strain: Not on file  Food Insecurity: No Food Insecurity (07/05/2022)   Hunger Vital Sign    Worried About Running Out of Food in the Last Year: Never true    Ran Out of Food in the Last Year: Never true  Transportation Needs: No Transportation Needs (07/05/2022)   PRAPARE - Scientist, research (physical sciences) (Medical): No    Lack of Transportation (Non-Medical): No  Physical Activity: Not on file  Stress: Not on file  Social Connections: Not on file  Intimate Partner Violence: Not At Risk (07/05/2022)   Humiliation, Afraid, Rape, and Kick questionnaire    Fear of Current or Ex-Partner: No    Emotionally Abused: No    Physically Abused: No    Sexually Abused: No    Family History  Problem Relation Age of Onset   Hypertension Father    No Known Allergies I? Current Outpatient Medications  Medication Sig Dispense Refill   acetaminophen (TYLENOL) 650 MG CR tablet Take 650 mg by mouth every 8 (eight) hours as needed for pain.     albuterol (VENTOLIN HFA) 108 (90 Base) MCG/ACT inhaler Inhale 2 puffs into the lungs every 6 (six) hours as needed for wheezing or shortness of breath.     apixaban (ELIQUIS) 5 MG TABS tablet Take 1 tablet (5  mg total) by mouth 2 (two) times daily. 60 tablet 3   ascorbic acid (VITAMIN C) 500 MG tablet Take 1 tablet (500 mg total) by mouth 2 (two) times daily. 60 tablet 0   aspirin EC 81 MG tablet Take 81 mg by mouth daily. Swallow whole.     B-D UF III MINI PEN NEEDLES 31G X 5 MM MISC See admin instructions.     barrier cream (NON-SPECIFIED) CREA Apply 1 Application topically 2 (two) times daily as needed.     calcium carbonate (TUMS - DOSED IN MG ELEMENTAL CALCIUM) 500 MG chewable tablet Chew 1 tablet (200 mg of elemental calcium total) by mouth 3 (three) times daily before meals.     carvedilol (COREG) 3.125 MG tablet Take 3.125 mg by mouth 2 (two) times daily with a meal.     ciprofloxacin (CIPRO) 500 MG tablet Take 1 tablet (500 mg total) by mouth 2 (two) times daily for 21 days. 42 tablet 0   clopidogrel (PLAVIX) 75 MG tablet Take 1 tablet (75 mg total) by mouth daily. 30 tablet 2   Continuous Glucose Sensor (FREESTYLE LIBRE 3 SENSOR) MISC USE 1 DEVICE EVERY 14 DAYS.     doxycycline (VIBRA-TABS) 100 MG tablet Take 1 tablet (100 mg total) by mouth  every 12 (twelve) hours for 21 days. 42 tablet 0   feeding supplement (ENSURE ENLIVE / ENSURE PLUS) LIQD Take 237 mLs by mouth 3 (three) times daily between meals. 237 mL 12   furosemide (LASIX) 20 MG tablet Take 20 mg by mouth daily.     hydrochlorothiazide (HYDRODIURIL) 25 MG tablet Take 25 mg by mouth daily.     insulin aspart (NOVOLOG) 100 UNIT/ML injection Inject 8 Units into the skin 3 (three) times daily before meals.     insulin glargine (LANTUS SOLOSTAR) 100 UNIT/ML Solostar Pen Inject 35 Units into the skin at bedtime. 15 mL 11   Multiple Vitamins-Minerals (CENTRUM SILVER 50+WOMEN PO) Take 1 tablet by mouth daily.     nutrition supplement, JUVEN, (JUVEN) PACK Take 1 packet by mouth 2 (two) times daily between meals. 60 each 11   nystatin cream (MYCOSTATIN) Apply 1 Application topically 2 (two) times daily.     pantoprazole (PROTONIX) 40 MG tablet Take 1 tablet (40 mg total) by mouth daily. 30 tablet 0   tiotropium (SPIRIVA) 18 MCG inhalation capsule Place 1 capsule into inhaler and inhale daily.     traMADol (ULTRAM) 50 MG tablet Take 50 mg by mouth every 6 (six) hours as needed.     Vitamin D, Ergocalciferol, (DRISDOL) 1.25 MG (50000 UNIT) CAPS capsule Take 50,000 Units by mouth once a week.     zinc sulfate 220 (50 Zn) MG capsule Take 1 capsule (220 mg total) by mouth daily. 30 capsule 0   atorvastatin (LIPITOR) 80 MG tablet Take 1 tablet (80 mg total) by mouth daily. 30 tablet 2   No current facility-administered medications for this visit.     Abtx:  Anti-infectives (From admission, onward)    None       REVIEW OF SYSTEMS:  Const: negative fever, negative chills, negative weight loss Eyes: negative diplopia or visual changes, negative eye pain ENT: negative coryza, negative sore throat Resp: negative cough, hemoptysis, dyspnea Cards: negative for chest pain, palpitations, lower extremity edema GU: negative for frequency, dysuria and hematuria GI: Negative for abdominal  pain, diarrhea, bleeding, constipation Skin: negative for rash and pruritus Heme: negative for easy bruising and gum/nose bleeding MS:  negative for myalgias, arthralgias, back pain and muscle weakness Neurolo:negative for headaches, dizziness, vertigo, memory problems  Psych: negative for feelings of anxiety, depression  Endocrine:, diabetes Allergy/Immunology- negative for any medication or food allergies  Objective:  VITALS:  BP (!) 88/60   Pulse 76   Temp (!) 96.5 F (35.8 C) (Temporal)   LMP 01/26/2015   PHYSICAL EXAM:  General: Alert, cooperative, no distress, appears stated age.  Patient always appeared pale Head: Normocephalic, without obvious abnormality, atraumatic. Eyes: Conjunctivae clear, anicteric sclerae. Pupils are equal ENT Nares normal. No drainage or sinus tenderness. Lips, mucosa, and tongue normal. No Thrush Neck: Supple, symmetrical, no adenopathy, thyroid: non tender no carotid bruit and no JVD. Back: No CVA tenderness. Lungs: Clear to auscultation bilaterally. No Wheezing or Rhonchi. No rales. Heart: Regular rate and rhythm, no murmur, rub or gallop. Abdomen: Soft, non-tender,not distended. Bowel sounds normal. No masses Extremities: Right foot  Surgical site looks very clean Well-approximated Sutures are still present Skin: No rashes or lesions. Or bruising Lymph: Cervical, supraclavicular normal. Neurologic: Grossly non-focal Pertinent Labs Lab Results CBC    Component Value Date/Time   WBC 4.8 07/21/2022 1303   RBC 3.51 (L) 07/21/2022 1303   HGB 8.1 (L) 07/21/2022 1303   HCT 27.9 (L) 07/21/2022 1303   PLT 336 07/21/2022 1303   MCV 79.5 (L) 07/21/2022 1303   MCH 23.1 (L) 07/21/2022 1303   MCHC 29.0 (L) 07/21/2022 1303   RDW 16.2 (H) 07/21/2022 1303   LYMPHSABS 1.0 07/05/2022 1338   MONOABS 0.3 07/05/2022 1338   EOSABS 0.1 07/05/2022 1338   BASOSABS 0.0 07/05/2022 1338       Latest Ref Rng & Units 07/21/2022    1:03 PM 07/14/2022     4:14 AM 07/13/2022    4:26 AM  CMP  Glucose 70 - 99 mg/dL 161   096   BUN 6 - 20 mg/dL 12   19   Creatinine 0.45 - 1.00 mg/dL 4.09  8.11  9.14   Sodium 135 - 145 mmol/L 136   134   Potassium 3.5 - 5.1 mmol/L 3.2   3.3   Chloride 98 - 111 mmol/L 102   100   CO2 22 - 32 mmol/L 27   28   Calcium 8.9 - 10.3 mg/dL 8.3   8.3   Total Protein 6.5 - 8.1 g/dL 6.5     Total Bilirubin 0.3 - 1.2 mg/dL 0.4     Alkaline Phos 38 - 126 U/L 118     AST 15 - 41 U/L 17     ALT 0 - 44 U/L 23     Sed rate was 63 on 07/21/2022 and CRP was 0.6 ? Impression/Recommendation PAD/diabetes mellitus with right foot infection.  Status post amputation of first and second toes in May 2024 Followed by removal of sesamoids Culture was MRSA and Pseudomonas Pathology was chronic focal osteomyelitis Patient is now on p.o. ciprofloxacin and p.o. doxycycline until 08/04/2022 She has received 2 weekly doses of dalbavancin 1500 mg each.  And prior to that in the hospital she was on IV antibiotics for MRSA and Pseudomonas.  She will complete 30 days of antibiotics on 08/04/2022.  The foot is very healthy The surgical site is well-approximated and is healing nicely Sutures will have to been removed She will follow-up with Dr. Alberteen Spindle She will not need any further antibiotics after 08/04/2022.  Soft BP While in the hospital she had a cortisol level of 4 and then  it was 5 at 5 AM and the cosyntropin test was 12.9 and 16.3 Plasma ACTH was 13.4 Patient will have to follow-up with her PCP for possibly repeating the test or referring to endocrine as needed. ? Anemia  Diabetes mellitus  PAD Communicated with Dr. Alberteen Spindle regarding an appointment with him earlier than 08/12/2022  Discussed the management in detail with the patient and her daughter.  Follow-up as needed with me Note:  This document was prepared using Dragon voice recognition software and may include unintentional dictation errors.

## 2022-07-27 NOTE — Patient Instructions (Addendum)
You are ehre for follow up of the rt foot infection- the surgical site looks well and healed- you are on doxy and cipro and you will complete 3 weeks on 08/04/22 and you dont need it anymore Please follow up with Dr.Cline. when you see him you can get some labs ESR/CRP/CMP/CBC Follow up with me as needed

## 2022-08-02 ENCOUNTER — Ambulatory Visit: Payer: Medicaid Other | Admitting: Physician Assistant

## 2022-08-03 ENCOUNTER — Encounter (INDEPENDENT_AMBULATORY_CARE_PROVIDER_SITE_OTHER): Payer: Medicaid Other

## 2022-08-03 ENCOUNTER — Ambulatory Visit (INDEPENDENT_AMBULATORY_CARE_PROVIDER_SITE_OTHER): Payer: Medicaid Other | Admitting: Vascular Surgery

## 2022-08-09 ENCOUNTER — Ambulatory Visit: Payer: Medicaid Other | Admitting: Physician Assistant

## 2022-09-15 ENCOUNTER — Other Ambulatory Visit (INDEPENDENT_AMBULATORY_CARE_PROVIDER_SITE_OTHER): Payer: Self-pay | Admitting: Nurse Practitioner

## 2022-09-15 DIAGNOSIS — Z9889 Other specified postprocedural states: Secondary | ICD-10-CM

## 2022-09-17 ENCOUNTER — Ambulatory Visit (INDEPENDENT_AMBULATORY_CARE_PROVIDER_SITE_OTHER): Payer: Medicaid Other | Admitting: Vascular Surgery

## 2022-09-17 ENCOUNTER — Encounter (INDEPENDENT_AMBULATORY_CARE_PROVIDER_SITE_OTHER): Payer: Medicaid Other

## 2022-09-29 ENCOUNTER — Other Ambulatory Visit (INDEPENDENT_AMBULATORY_CARE_PROVIDER_SITE_OTHER): Payer: Self-pay | Admitting: Nurse Practitioner

## 2022-09-30 NOTE — Telephone Encounter (Signed)
Enough until next appt.

## 2022-10-01 ENCOUNTER — Encounter: Payer: Medicaid Other | Attending: Physician Assistant | Admitting: Physician Assistant

## 2022-10-01 DIAGNOSIS — E1151 Type 2 diabetes mellitus with diabetic peripheral angiopathy without gangrene: Secondary | ICD-10-CM | POA: Insufficient documentation

## 2022-10-01 DIAGNOSIS — I110 Hypertensive heart disease with heart failure: Secondary | ICD-10-CM | POA: Diagnosis not present

## 2022-10-01 DIAGNOSIS — L89616 Pressure-induced deep tissue damage of right heel: Secondary | ICD-10-CM | POA: Insufficient documentation

## 2022-10-01 DIAGNOSIS — I5042 Chronic combined systolic (congestive) and diastolic (congestive) heart failure: Secondary | ICD-10-CM | POA: Insufficient documentation

## 2022-10-01 NOTE — Progress Notes (Signed)
Loomis, Beth Carroll ([id number]) [id number] Page 1 of 8 Visit Report for 10/01/2022 Chief Complaint Document Details Patient Name: Date of Service: Beth Carroll, Beth Carroll [facility] 10/01/2022 11:30 A M Medical Record Number: [id number] Patient Account Number: [id number] Date of Birth/Sex: Treating RN: January 19, 1971 (52 y.o. [name] Primary Care Provider: Marcelino Duster Other Clinician: Referring Provider: Treating Provider/Extender: Allen Derry Self, Referral Weeks in Treatment: 0 Information Obtained from: Patient Chief Complaint Right heel deep tissue injury Electronic Signature(s) Signed: 10/01/2022 12:52:16 PM By: Allen Derry PA-C Previous Signature: 10/01/2022 12:34:47 PM Version By: Allen Derry PA-C Entered By: Allen Derry on 10/01/2022 09:52:16 -------------------------------------------------------------------------------- HPI Details Patient Name: Date of Service: Beth Carroll, Beth Carroll 10/01/2022 11:30 A M Medical Record Number: [id number] Patient Account Number: [id number] Date of Birth/Sex: Treating RN: 1971-01-02 (52 y.o. [name] Primary Care Provider: Marcelino Duster Other Clinician: Referring Provider: Treating Provider/Extender: Allen Derry Self, Referral Weeks in Treatment: 0 History of Present Illness HPI Description: 05-07-2022 upon evaluation today patient appears to be doing somewhat poorly in regard to her right foot in general. She subsequently also has a wound on the sacral area but this is a very superficial stage II pressure ulcer. Nonetheless the foot in particular although I could not get in epic I gleaned is much as I could from her story of what exactly happened here. T backtrack it does appear that she was in the hospital for diabetic ketoacidosis and this o was back in March 2024. With that being said during the time that she was in the hospital she also apparently had an issue with having a thrombus in the right leg in the ankle/foot region which  ended up with her having essentially complete loss of blood flow to her foot where it was turning purple and discoloring rapidly. She ended up having a nurse evaluate this and ended up with a stat angiography to go in and find what was going on they were able to identify the clot clear this out and subsequently restore blood flow. However this was not without already having damage to the distal portion of her toes. With that being said the good news is she seems to have good blood flow at the moment I was able to recheck and that we were able to get a good pulse with Doppler. With that being said I think that a lot of the injury to her toes is more of a degloving type injury at this point which hopefully will heal the first and second toes are the main ones that are of concern in my opinion the others I think just have blisters which will come off easily and should heal quite readily. The patient tells me that her blood sugars are still elevated but not as bad as what they have been previous. Again she has had multiple instances of having fairly significant DKA based on what I am hearing. Patient does have a history of peripheral vascular disease of the right lower extremity, stage II pressure ulcer to the sacrum that occurred while she was in the hospital, diabetes mellitus type 2, congestive heart failure, and hypertension. Her most recent hemoglobin A1c was 9.8 that was on 04-23-2022 [place] ([id number]) [id number] Page 2 of 8 05-13-2022 upon evaluation today patient appears to be doing decently well with regard to her wounds in general. With that being said it is the great toe and the second toe and the most concerned I am not certain that the tip of the great toe survive. Nonetheless  I am good I have a look at what we can see today. 05-21-2022 upon evaluation today patient appears to be doing well currently in regard to her wounds all things considered. Fortunately I  do not see any signs of active infection locally nor systemically which is great news but at the same time I do believe that she is making progress but just doing so fairly slowly. She still seems to have good arterial flow at this point. 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 I think she could be a hyperbaric candidate considering that she does have diabetes and again were not seeing a lot of significant improvement here. I think this is something that we should at least contemplate and look into it may be something that she would be interested in but I do not know for sure having to discuss it with her next week whenever she comes in for evaluation. 05-31-2022 upon evaluation today patient appears to be doing well currently in regard to her heel and the remainder of her foot minus the first and second toe of her right foot. She did see Dr. Wyn Quaker there could be looking at amputation of the great toe as well as the distal portion of the second toe again and these did not survive following the thrombotic event. The good news is she seems to be doing much better and has excellent flow now to be able to heal this surgery. 06-14-23 upon evaluation today patient appears to be doing well currently in regard to her wound on the heel which is actually smaller although it was bleeding a lot she was wrapped with a bulky dressing from the surgery which was actually 2 Thursdays ago so about a week and a half she has had this dressing in place. Subsequently she had bleeding and it was very stuck that was a big part of the issue here. I do not see any signs of infection which is good news she does to me appear that she would benefit from using some Betadine and dry gauze over the surgical site in order to prevent this from continuing to bleed help dry it up and keep it from becoming infected. She voiced understanding. Otherwise she notes that she does have a follow-up that she thinks is 2 weeks  away with vascular. She is back on her blood thinners at this point. Unfortunately she does have a new heel ulcer on the left side which is of concern this happened while she was in the hospital. 06-22-2022 upon evaluation today patient's heels are actually showing some signs of good improvement which is great news. Fortunately I do not see any signs of infection in either heel location. With that being said a little bit more concerned about the amputation site. I am not sure this is doing quite as well as what we would like to see. I discussed with the patient today that we probably need to see about having her have a follow-up with vascular little bit sooner. She voiced understanding. I am going to give her a note today as well. Not sure if she really just needs another antibiotic if they want to do a culture or something just more empiric. I am also uncertain if they want to change anything from a dressing perspective. Right now it has been Betadine with dry dressing to cover that is been initiated I did send orders to home health for this since they were changing the heel and  it was somewhat intertwined with the dressing on the end of the foot. But again being that this is a postop wound where he can have coordinating with vascular on this. 06-28-2022 upon evaluation today patient appears to be doing well currently in regard to her heel ulcers in fact the left is healed the right looks better and a little hyper granulated, perform some silver nitrate at the site. With that being said the amputation site is still somewhat wet and again I am unsure whether this is going to seal up or if it is going to dehisce. She is seeing the vascular surgeons for this were really not doing anything for treatment at this point there is using Betadine and dry dressing. 07-05-2022 upon evaluation today patient comes in for reevaluation. Unfortunately she is not doing well her blood pressure is extremely low registering  it initially at 59/41 subsequently 57/43 heart rate is right around 72-77 taken manually. Oxygen saturation 96% she has had recent cold sweats and dizziness she also appears to be having issues here with either severe dehydration which I think is less likely although the differential does include this I think it is very possible she may actually be septic. I think she needs to go to the ER for further evaluation. She has recently had surgery with Dr. Wyn Quaker. Readmission: 10-01-2022 upon evaluation today patient appears to be doing actually pretty well in regard to the last time I saw her comparatively she is significantly improved. She does have an issue with her right lateral heel which is of concern today where she has some bruising. Her past medical history really has not changed since I last saw her in June. In fact she has been doing better if anything. They finally got the infection under control and she is really doing quite well. The amputation site healed excellent. Electronic Signature(s) Signed: 10/01/2022 1:48:43 PM By: Allen Derry PA-C Entered By: Allen Derry on 10/01/2022 10:48:42 -------------------------------------------------------------------------------- Physical Exam Details Patient Name: Date of Service: Beth Carroll, Beth Carroll 10/01/2022 11:30 A M Medical Record Number: [id number] Patient Account Number: [id number] Date of Birth/Sex: Treating RN: Apr 21, 1970 (52 y.o. [name] Primary Care Provider: Marcelino Duster Other Clinician: Referring Provider: Treating Provider/Extender: Allen Derry Self, Referral Weeks in Treatment: 0 Constitutional sitting or standing blood pressure is within target range for patient.. pulse regular and within target range for patient.Marland Kitchen respirations regular, non-labored and within target range for patient.Marland Kitchen temperature within target range for patient.. Well-nourished and well-hydrated in no acute distress. Eyes conjunctiva clear no eyelid edema noted.  pupils equal round and reactive to light and accommodation. Ears, Nose, Mouth, and Throat no gross abnormality of ear auricles or external auditory canals. normal hearing noted during conversation. mucus membranes moist. [place], [place] ([id number]) [id number] Page 3 of 8 Respiratory normal breathing without difficulty. Cardiovascular 1+ dorsalis pedis/posterior tibialis pulses. no clubbing, cyanosis, significant edema, <3 sec cap refill. Musculoskeletal normal gait and posture. no significant deformity or arthritic changes, no loss or range of motion, no clubbing. Psychiatric this patient is able to make decisions and demonstrates good insight into disease process. Alert and Oriented x 3. pleasant and cooperative. Notes Upon inspection patient has what appears to be just a deep tissue injury on the lateral portion of the right heel. I discussed with her after looking at this extensively today that there does not appear to be anything open at this point there may have been at one time but is actually healed and doing quite well.  I am extremely pleased with where we stand and I think that she is making good headway towards complete closure which is great news. Electronic Signature(s) Signed: 10/01/2022 1:49:10 PM By: Allen Derry PA-C Entered By: Allen Derry on 10/01/2022 10:49:10 -------------------------------------------------------------------------------- Physician Orders Details Patient Name: Date of Service: Beth Carroll, Beth Carroll [facility] 10/01/2022 11:30 A M Medical Record Number: [id number] Patient Account Number: [id number] Date of Birth/Sex: Treating RN: 1971-01-25 (52 y.o. [name] Primary Care Provider: Marcelino Duster Other Clinician: Referring Provider: Treating Provider/Extender: Allen Derry Self, Referral Weeks in Treatment: 0 Verbal / Phone Orders: No Diagnosis Coding ICD-10 Coding Code Description [phone] Pressure ulcer of right heel, stage 2 L89.616  Pressure-induced deep tissue damage of right heel E11.621 Type 2 diabetes mellitus with foot ulcer I73.89 Other specified peripheral vascular diseases I50.42 Chronic combined systolic (congestive) and diastolic (congestive) heart failure I10 Essential (primary) hypertension Follow-up Appointments Return Appointment in 2 weeks. Bathing/ Shower/ Hygiene May shower; gently cleanse wound with antibacterial soap, rinse and pat dry prior to dressing wounds Off-Loading Open toe surgical shoe Other: - Heel cup held with stretch net Electronic Signature(s) Signed: 10/01/2022 1:05:55 PM By: Beth Aver MSN RN CNS WTA Signed: 10/01/2022 2:02:35 PM By: Allen Derry PA-C Entered By: Beth Carroll on 10/01/2022 09:44:02 [place], Beth Carroll ([id number]) [id number] Page 4 of 8 -------------------------------------------------------------------------------- Problem List Details Patient Name: Date of Service: Beth Carroll, Beth Carroll 10/01/2022 11:30 A M Medical Record Number: [id number] Patient Account Number: [id number] Date of Birth/Sex: Treating RN: [birth year] (52 y.o. [name] Primary Care Provider: Marcelino Duster Other Clinician: Referring Provider: Treating Provider/Extender: Allen Derry Self, Referral Weeks in Treatment: 0 Active Problems ICD-10 Encounter Code Description Active Date MDM Diagnosis L89.616 Pressure-induced deep tissue damage of right heel 10/01/2022 No Yes E11.621 Type 2 diabetes mellitus with foot ulcer 10/01/2022 No Yes I73.89 Other specified peripheral vascular diseases 10/01/2022 No Yes I50.42 Chronic combined systolic (congestive) and diastolic (congestive) heart failure 10/01/2022 No Yes I10 Essential (primary) hypertension 10/01/2022 No Yes Inactive Problems Resolved Problems Electronic Signature(s) Signed: 10/01/2022 12:52:01 PM By: Allen Derry PA-C Previous Signature: 10/01/2022 12:34:30 PM Version By: Allen Derry PA-C Previous Signature: 10/01/2022 12:33:53 PM  Version By: Allen Derry PA-C Entered By: Allen Derry on 10/01/2022 09:52:00 -------------------------------------------------------------------------------- Progress Note Details Patient Name: Date of Service: Beth Carroll, Beth Carroll 10/01/2022 11:30 A M Medical Record Number: [id number] Patient Account Number: [id number] Beth Carroll, Beth Carroll ([id number]) [id number] Page 5 of 8 Date of Birth/Sex: Treating RN: Dec 23, 1970 (52 y.o. [name] Primary Care Provider: Other Clinician: Marcelino Duster Referring Provider: Treating Provider/Extender: Allen Derry Self, Referral Weeks in Treatment: 0 Subjective Chief Complaint Information obtained from Patient Right heel deep tissue injury History of Present Illness (HPI) 05-07-2022 upon evaluation today patient appears to be doing somewhat poorly in regard to her right foot in general. She subsequently also has a wound on the sacral area but this is a very superficial stage II pressure ulcer. Nonetheless the foot in particular although I could not get in epic I gleaned is much as I could from her story of what exactly happened here. T backtrack it does appear that she was in the hospital for diabetic ketoacidosis and this was back in March o 2024. With that being said during the time that she was in the hospital she also apparently had an issue with having a thrombus in the right leg in the ankle/foot region which ended up with her having essentially complete loss of blood flow to her foot where it was turning  purple and discoloring rapidly. She ended up having a nurse evaluate this and ended up with a stat angiography to go in and find what was going on they were able to identify the clot clear this out and subsequently restore blood flow. However this was not without already having damage to the distal portion of her toes. With that being said the good news is she seems to have good blood flow at the moment I was able to recheck and  that we were able to get a good pulse with Doppler. With that being said I think that a lot of the injury to her toes is more of a degloving type injury at this point which hopefully will heal the first and second toes are the main ones that are of concern in my opinion the others I think just have blisters which will come off easily and should heal quite readily. The patient tells me that her blood sugars are still elevated but not as bad as what they have been previous. Again she has had multiple instances of having fairly significant DKA based on what I am hearing. Patient does have a history of peripheral vascular disease of the right lower extremity, stage II pressure ulcer to the sacrum that occurred while she was in the hospital, diabetes mellitus type 2, congestive heart failure, and hypertension. Her most recent hemoglobin A1c was 9.8 that was on 04-23-2022 05-13-2022 upon evaluation today patient appears to be doing decently well with regard to her wounds in general. With that being said it is the great toe and the second toe and the most concerned I am not certain that the tip of the great toe survive. Nonetheless I am good I have a look at what we can see today. 05-21-2022 upon evaluation today patient appears to be doing well currently in regard to her wounds all things considered. Fortunately I do not see any signs of active infection locally nor systemically which is great news but at the same time I do believe that she is making progress but just doing so fairly slowly. She still seems to have good arterial flow at this point. 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 I think she could be a hyperbaric candidate considering that she does have diabetes and again were not seeing a lot of significant improvement here. I think this is something that we should at least contemplate and look into it may be something that she would be interested in but I do not know for sure  having to discuss it with her next week whenever she comes in for evaluation. 05-31-2022 upon evaluation today patient appears to be doing well currently in regard to her heel and the remainder of her foot minus the first and second toe of her right foot. She did see Dr. Wyn Quaker there could be looking at amputation of the great toe as well as the distal portion of the second toe again and these did not survive following the thrombotic event. The good news is she seems to be doing much better and has excellent flow now to be able to heal this surgery. 06-14-23 upon evaluation today patient appears to be doing well currently in regard to her wound on the heel which is actually smaller although it was bleeding a lot she was wrapped with a bulky dressing from the surgery which was actually 2 Thursdays ago so about a week and a half she has had this dressing  in place. Subsequently she had bleeding and it was very stuck that was a big part of the issue here. I do not see any signs of infection which is good news she does to me appear that she would benefit from using some Betadine and dry gauze over the surgical site in order to prevent this from continuing to bleed help dry it up and keep it from becoming infected. She voiced understanding. Otherwise she notes that she does have a follow-up that she thinks is 2 weeks away with vascular. She is back on her blood thinners at this point. Unfortunately she does have a new heel ulcer on the left side which is of concern this happened while she was in the hospital. 06-22-2022 upon evaluation today patient's heels are actually showing some signs of good improvement which is great news. Fortunately I do not see any signs of infection in either heel location. With that being said a little bit more concerned about the amputation site. I am not sure this is doing quite as well as what we would like to see. I discussed with the patient today that we probably need to see about  having her have a follow-up with vascular little bit sooner. She voiced understanding. I am going to give her a note today as well. Not sure if she really just needs another antibiotic if they want to do a culture or something just more empiric. I am also uncertain if they want to change anything from a dressing perspective. Right now it has been Betadine with dry dressing to cover that is been initiated I did send orders to home health for this since they were changing the heel and it was somewhat intertwined with the dressing on the end of the foot. But again being that this is a postop wound where he can have coordinating with vascular on this. 06-28-2022 upon evaluation today patient appears to be doing well currently in regard to her heel ulcers in fact the left is healed the right looks better and a little hyper granulated, perform some silver nitrate at the site. With that being said the amputation site is still somewhat wet and again I am unsure whether this is going to seal up or if it is going to dehisce. She is seeing the vascular surgeons for this were really not doing anything for treatment at this point there is using Betadine and dry dressing. 07-05-2022 upon evaluation today patient comes in for reevaluation. Unfortunately she is not doing well her blood pressure is extremely low registering it initially at 59/41 subsequently 57/43 heart rate is right around 72-77 taken manually. Oxygen saturation 96% she has had recent cold sweats and dizziness she also appears to be having issues here with either severe dehydration which I think is less likely although the differential does include this I think it is very possible she may actually be septic. I think she needs to go to the ER for further evaluation. She has recently had surgery with Dr. Wyn Quaker. Readmission: 10-01-2022 upon evaluation today patient appears to be doing actually pretty well in regard to the last time I saw her comparatively she  is significantly improved. She does have an issue with her right lateral heel which is of concern today where she has some bruising. Her past medical history really has not changed since I last saw her in June. In fact she has been doing better if anything. They finally got the infection under control and she is  really doing quite well. The amputation site healed excellent. Patient History Information obtained from Patient. Allergies No Known Drug Allergies Social History Former smoker - quit 2022, Alcohol Use - Never, Drug Use - No History, Caffeine Use - Rarely - diet dr pepper. Medical History Cardiovascular Patient has history of Congestive Heart Failure, Hypertension, Peripheral Arterial Disease HOTT, Beth Carroll ([id number]) [id number] Page 6 of 8 Endocrine Patient has history of Type II Diabetes Objective Constitutional sitting or standing blood pressure is within target range for patient.. pulse regular and within target range for patient.Marland Kitchen respirations regular, non-labored and within target range for patient.Marland Kitchen temperature within target range for patient.. Well-nourished and well-hydrated in no acute distress. Vitals Time Taken: 12:02 PM, Temperature: 97.9 F, Pulse: 78 bpm, Respiratory Rate: 16 breaths/min, Blood Pressure: 100/76 mmHg. Eyes conjunctiva clear no eyelid edema noted. pupils equal round and reactive to light and accommodation. Ears, Nose, Mouth, and Throat no gross abnormality of ear auricles or external auditory canals. normal hearing noted during conversation. mucus membranes moist. Respiratory normal breathing without difficulty. Cardiovascular 1+ dorsalis pedis/posterior tibialis pulses. no clubbing, cyanosis, significant edema, Musculoskeletal normal gait and posture. no significant deformity or arthritic changes, no loss or range of motion, no clubbing. Psychiatric this patient is able to make decisions and demonstrates good insight  into disease process. Alert and Oriented x 3. pleasant and cooperative. General Notes: Upon inspection patient has what appears to be just a deep tissue injury on the lateral portion of the right heel. I discussed with her after looking at this extensively today that there does not appear to be anything open at this point there may have been at one time but is actually healed and doing quite well. I am extremely pleased with where we stand and I think that she is making good headway towards complete closure which is great news. Other Condition(s) Patient presents with Suspected Deep Tissue Injury located on the Right Foot. 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 Bathing/ Shower/ Hygiene: May shower; gently cleanse wound with antibacterial soap, rinse and pat dry prior to dressing wounds Off-Loading: Open toe surgical shoe Other: - Heel cup held with stretch net 1. I am going to have her continue with appropriate offloading I think this is to be of utmost concern. If she is able to keep pressure off of this heal I think she will be able to heal quite nicely. 2. I am good recommend as well the patient should continue with a heel cup to the foot followed by a sock to hold in place or stretching that we put stretching at all today. 3. I would recommend that when she is getting up and working with physical therapy to walk around the regular tennis shoes that are not too tight and causing any rubbing would be the best thing to do as opposed to the offloading shoes. We will see patient back for reevaluation in 1 week here in the clinic. If anything worsens or changes patient will contact our office  for additional recommendations. [place], Beth Carroll ([id number]) [id number] Page 7 of 8 Electronic Signature(s) Signed: 10/01/2022 1:49:48 PM By: Allen Derry PA-C Entered By: Allen Derry on 10/01/2022 10:49:48 -------------------------------------------------------------------------------- ROS/PFSH Details Patient Name: Date of Service: Beth Carroll, Beth Carroll [facility] 10/01/2022 11:30 A M Medical Record Number: [id number] Patient  Account Number: [id number] Date of Birth/Sex: Treating RN: [birth year] (52 y.o. [name] Primary Care Provider: Marcelino Duster Other Clinician: Referring Provider: Treating Provider/Extender: Allen Derry Self, Referral Weeks in Treatment: 0 Information Obtained From Patient Cardiovascular Medical History: Positive for: Congestive Heart Failure; Hypertension; Peripheral Arterial Disease Endocrine Medical History: Positive for: Type II Diabetes Time with diabetes: 20 years Treated with: Insulin Blood sugar tested every day: Yes Tested : daily, has a CGM Blood sugar testing results: Breakfast: 150 Immunizations Pneumococcal Vaccine: Received Pneumococcal Vaccination: No Implantable Devices None Family and Social History Former smoker - quit 2022; Alcohol Use: Never; Drug Use: No History; Caffeine Use: Rarely - diet dr Reino Kent Electronic Signature(s) Signed: 10/01/2022 1:05:55 PM By: Beth Aver MSN RN CNS WTA Signed: 10/01/2022 2:02:35 PM By: Allen Derry PA-C Entered By: Beth Carroll on 10/01/2022 09:04:39 Beth Carroll ([id number]) [id number] Page 8 of 8 -------------------------------------------------------------------------------- SuperBill Details Patient Name: Date of Service: Beth Carroll, Beth Carroll 10/01/2022 Medical Record Number: [id number] Patient Account Number: [id number] Date of Birth/Sex: Treating RN: 05/15/1970 (52 y.o. 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 E11.621 Type 2 diabetes mellitus with foot ulcer I73.89 Other specified peripheral vascular diseases I50.42 Chronic combined systolic (congestive) and diastolic (congestive) heart failure I10 Essential (primary) hypertension Facility Procedures : CPT4 Code: 86578469 Description: 99213 - WOUND CARE VISIT-LEV 3 EST PT Modifier: Quantity: 1 Physician Procedures : CPT4 Code Description Modifier [id number] 99214 - WC PHYS LEVEL 4 - EST PT ICD-10 Diagnosis Description L89.616 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

## 2022-10-01 NOTE — Progress Notes (Addendum)
[place], Beth Carroll ([id number]) [id number] Page 1 of 7 Visit Report for 10/01/2022 Allergy List Details Patient Name: Date of Service: Beth Carroll, Beth Carroll [facility] 10/01/2022 11:30 A M Medical Record Number: [id number] Patient Account Number: [id number] Date of Birth/Sex: Treating RN: 07-03-1970 (52 y.o. [name] Primary Care Chanze Teagle: Marcelino Duster Other Clinician: Referring Chistian Kasler: Treating Livvy Spilman/Extender: Joylene Grapes in Treatment: 0 Allergies Active Allergies No Known Drug Allergies Allergy Notes Electronic Signature(s) Signed: 10/01/2022 1:05:55 PM By: Midge Aver MSN RN CNS WTA Entered By: Midge Aver on 10/01/2022 09:04:24 -------------------------------------------------------------------------------- Arrival Information Details Patient Name: Date of Service: Beth Carroll, Beth Carroll 10/01/2022 11:30 A M Medical Record Number: [id number] Patient Account Number: [id number] Date of Birth/Sex: Treating RN: [birth year] (52 y.o. [name] Primary Care Zafira Munos: Marcelino Duster Other Clinician: Referring Marge Vandermeulen: Treating Marlaysia Lenig/Extender: Joylene Grapes in Treatment: 0 Visit Information Patient Arrived: Wheel Chair Arrival Time: 12:00 Accompanied By: daughter Transfer Assistance: None Patient Identification Verified: Yes Secondary Verification Process Completed: Yes Patient Requires Transmission-Based Precautions: No Patient Has Alerts: Yes Patient Alerts: Diabetic Eliquis ABI R 1.49 05/27/22 ABI L 1.50 05/27/22 History Since Last Visit Added or deleted any medications: No Any new allergies or adverse reactions: No Has Dressing in Place as Prescribed: Yes Pain Present Now: No Electronic Signature(s) [place], Beth Carroll ([id number]) [id number] Page 2 of 7 Signed: 10/01/2022 1:05:55 PM By: Midge Aver MSN RN CNS WTA Entered By: Midge Aver on 10/01/2022  09:52:06 -------------------------------------------------------------------------------- Clinic Level of Care Assessment Details Patient Name: Date of Service: Beth Carroll, Beth Carroll 10/01/2022 11:30 A M Medical Record Number: [id number] Patient Account Number: [id number] Date of Birth/Sex: Treating RN: [birth year] (52 y.o. [name] Primary Care Mitsue Peery: Marcelino Duster Other Clinician: Referring Ravinder Hofland: Treating Dawud Mays/Extender: Joylene Grapes in Treatment: 0 Clinic Level of Care Assessment Items TOOL 1 Quantity Score X- 1 0 Use when EandM and Procedure is performed on INITIAL visit ASSESSMENTS - Nursing Assessment / Reassessment X- 1 20 General Physical Exam (combine w/ comprehensive assessment (listed just below) when performed on new pt. evals) X- 1 25 Comprehensive Assessment (HX, ROS, Risk Assessments, Wounds Hx, etc.) ASSESSMENTS - Wound and Skin Assessment / Reassessment []  - 0 Dermatologic / Skin Assessment (not related to wound area) ASSESSMENTS - Ostomy and/or Continence Assessment and Care []  - 0 Incontinence Assessment and Management []  - 0 Ostomy Care Assessment and Management (repouching, etc.) PROCESS - Coordination of Care X - Simple Patient / Family Education for ongoing care 1 15 []  - 0 Complex (extensive) Patient / Family Education for ongoing care X- 1 10 Staff obtains Chiropractor, Records, T Results / Process Orders est []  - 0 Staff telephones HHA, Nursing Homes / Clarify orders / etc []  - 0 Routine Transfer to another Facility (non-emergent condition) []  - 0 Routine Hospital Admission (non-emergent condition) X- 1 15 New Admissions / Manufacturing engineer / Ordering NPWT Apligraf, etc. , []  - 0 Emergency Hospital Admission (emergent condition) PROCESS - Special Needs []  - 0 Pediatric / Minor Patient Management []  - 0 Isolation Patient Management []  - 0 Hearing / Language / Visual special needs []  - 0 Assessment of  Community assistance (transportation, D/C planning, etc.) []  - 0 Additional assistance / Altered mentation []  - 0 Support Surface(s) Assessment (bed, cushion, seat, etc.) INTERVENTIONS - Miscellaneous []  - 0 External ear exam []  - 0 Patient Transfer (multiple staff / Nurse, adult / Similar devices) []  - 0 Simple Staple / Suture removal (25 or less) [place], [place] (  [id number]) [id number] Page 3 of 7 []  - 0 Complex Staple / Suture removal (26 or more) []  - 0 Hypo/Hyperglycemic Management (do not check if billed separately) []  - 0 Ankle / Brachial Index (ABI) - do not check if billed separately Has the patient been seen at the hospital within the last three years: Yes Total Score: 85 Level Of Care: New/Established - Level 3 Electronic Signature(s) Signed: 10/01/2022 1:05:55 PM By: Midge Aver MSN RN CNS WTA Entered By: Midge Aver on 10/01/2022 09:44:24 -------------------------------------------------------------------------------- Encounter Discharge Information Details Patient Name: Date of Service: Beth Carroll, Beth Carroll [facility] 10/01/2022 11:30 A M Medical Record Number: [id number] Patient Account Number: [id number] Date of Birth/Sex: Treating RN: 1970-06-21 (52 y.o. [name] Primary Care Nancye Grumbine: Marcelino Duster Other Clinician: Referring Ayanah Snader: Treating Dariane Natzke/Extender: Joylene Grapes in Treatment: 0 Encounter Discharge Information Items Discharge Condition: Stable Ambulatory Status: Wheelchair Discharge Destination: Home Transportation: Private Auto Accompanied By: daughter Schedule Follow-up Appointment: Yes Clinical Summary of Care: Electronic Signature(s) Signed: 10/01/2022 1:05:55 PM By: Midge Aver MSN RN CNS WTA Entered By: Midge Aver on 10/01/2022 09:47:45 -------------------------------------------------------------------------------- Lower Extremity Assessment Details Patient Name: Date of Service: Beth Carroll, Beth Carroll  10/01/2022 11:30 A M Medical Record Number: [id number] Patient Account Number: [id number] Date of Birth/Sex: Treating RN: [birth year] (51 y.o. [name] Primary Care Braelynn Lupton: Marcelino Duster Other Clinician: Referring Amry Cathy: Treating Jaiveer Panas/Extender: Joylene Grapes in Treatment: 0 Electronic Signature(s) Signed: 10/01/2022 1:05:55 PM By: Midge Aver MSN RN CNS [address] [place] ([id number]) PM By: Midge Aver MSN RN CNS Margarita Rana [phone].pdf Page 4 of 7 Signed: 10/01/2022 1:05:55 Entered By: Midge Aver on 10/01/2022 09:15:19 -------------------------------------------------------------------------------- Multi Wound Chart Details Patient Name: Date of Service: Beth Carroll, Beth Carroll [facility] 10/01/2022 11:30 A M Medical Record Number: [id number] Patient Account Number: [id number] Date of Birth/Sex: Treating RN: 21-May-1970 (52 y.o. [name] Primary Care Cortney Beissel: Marcelino Duster Other Clinician: Referring Maram Bently: Treating Anatalia Kronk/Extender: Joylene Grapes in Treatment: 0 Vital Signs Height(in): Pulse(bpm): 78 Weight(lbs): Blood Pressure(mmHg): 100/76 Body Mass Index(BMI): Temperature(F): 97.9 Respiratory Rate(breaths/min): 16 [10:Photos:] [N/A:N/A] Right, Lateral Calcaneus N/A N/A Wound Location: Pressure Injury N/A N/A Wounding Event: Pressure Ulcer N/A N/A Primary Etiology: Congestive Heart Failure, N/A N/A Comorbid History: Hypertension, Peripheral Arterial Disease, Type II Diabetes 09/26/2022 N/A N/A Date Acquired: 0 N/A N/A Weeks of Treatment: Open N/A N/A Wound Status: No N/A N/A Wound Recurrence: 0.7x0.8x0.1 N/A N/A Measurements L x W x D (cm) 0.44 N/A N/A A (cm) : rea 0.044 N/A N/A Volume (cm) : Category/Stage II N/A N/A Classification: Medium N/A N/A Exudate A mount: Serosanguineous N/A N/A Exudate Type: red, brown N/A N/A Exudate Color: Small (1-33%) N/A N/A Granulation A mount: Red, Pink  N/A N/A Granulation Quality: Small (1-33%) N/A N/A Necrotic A mount: Eschar N/A N/A Necrotic Tissue: Fat Layer (Subcutaneous Tissue): Yes N/A N/A Exposed Structures: Fascia: No Tendon: No Muscle: No Joint: No Bone: No None N/A N/A Epithelialization: Treatment Notes Electronic Signature(s) Signed: 10/01/2022 1:05:55 PM By: Midge Aver MSN RN CNS WTA Previous Signature: 10/01/2022 12:30:57 PM Version By: Midge Aver MSN RN CNS Tresa Garter, Deniss ([id number]) [phone].pdf Page 5 of 7 Previous Signature: 10/01/2022 12:30:57 PM Version By: Midge Aver MSN RN CNS WTA Entered By: Midge Aver on 10/01/2022 09:36:58 -------------------------------------------------------------------------------- Multi-Disciplinary Care Plan Details Patient Name: Date of Service: Beth Carroll, Beth Carroll [facility] 10/01/2022 11:30 A M Medical Record Number: [id number] Patient Account Number: [id number] Date of Birth/Sex: Treating RN: 06-30-1970 (52 y.o. [name] Primary Care  Masato Pettie: Marcelino Duster Other Clinician: Referring Laronica Bhagat: Treating Malani Lees/Extender: Joylene Grapes in Treatment: 0 Active Inactive Electronic Signature(s) Signed: 11/10/2022 5:08:20 PM By: Elliot Gurney BSN, RN, CWS, Kim RN, BSN Signed: 11/19/2022 1:46:45 PM By: Midge Aver MSN RN CNS WTA Previous Signature: 10/01/2022 1:05:55 PM Version By: Midge Aver MSN RN CNS WTA Previous Signature: 10/01/2022 12:29:55 PM Version By: Midge Aver MSN RN CNS WTA Previous Signature: 10/01/2022 12:29:50 PM Version By: Midge Aver MSN RN CNS WTA Entered By: Elliot Gurney, BSN, RN, CWS, Kim on 11/10/2022 14:08:20 -------------------------------------------------------------------------------- Non-Wound Condition Assessment Details Patient Name: Date of Service: Beth Carroll 9/6/2024andnbsp11:30 A M Medical Record Number: [id number] Patient Account Number: [id number] Date of Birth/Gender: Treating RN: 06-17-1970 (52 y.o. [name] Primary Care Physician: Marcelino Duster Other Clinician: Referring Physician: Treating Physician/Extender: Youlanda Roys, Vivianne Spence in Treatment: 0 Non-Wound Condition: Condition: Suspected Deep Tissue Injury Location: Foot Side: Right Photos [place], Beth Carroll ([id number]) [id number] Page 6 of 7 Electronic Signature(s) Signed: 10/01/2022 1:05:55 PM By: Midge Aver MSN RN CNS WTA Entered By: Midge Aver on 10/01/2022 09:40:53 -------------------------------------------------------------------------------- Pain Assessment Details Patient Name: Date of Service: Beth Carroll, Beth Carroll [facility] 10/01/2022 11:30 A M Medical Record Number: [id number] Patient Account Number: [id number] Date of Birth/Sex: Treating RN: [birth year] (52 y.o. [name] Primary Care Jalyah Weinheimer: Marcelino Duster Other Clinician: Referring Viral Schramm: Treating Malessa Zartman/Extender: Joylene Grapes in Treatment: 0 Active Problems Location of Pain Severity and Description of Pain Patient Has Paino No Site Locations Pain Management and Medication Current Pain Management: Electronic Signature(s) Signed: 10/01/2022 1:05:55 PM By: Midge Aver MSN RN CNS WTA Entered By: Midge Aver on 10/01/2022 09:02:02 [name] ([id number]) [id number] Page 7 of 7 -------------------------------------------------------------------------------- Patient/Caregiver Education Details Patient Name: Date of Service: Beth Carroll, Beth Carroll 9/6/2024andnbsp11:30 A M Medical Record Number: [id number] Patient Account Number: [id number] Date of Birth/Gender: Treating RN: [birth year] (52 y.o. [name] Primary Care Physician: Marcelino Duster Other Clinician: Referring Physician: Treating Physician/Extender: Joylene Grapes in Treatment: 0 Education Assessment Education Provided To: Patient Education Topics Provided Wound/Skin Impairment: Handouts:  Caring for Your Ulcer Methods: Explain/Verbal Responses: State content correctly Electronic Signature(s) Signed: 10/01/2022 1:05:55 PM By: Midge Aver MSN RN CNS WTA Entered By: Midge Aver on 10/01/2022 09:44:50 -------------------------------------------------------------------------------- Vitals Details Patient Name: Date of Service: Beth Carroll, Beth Carroll [facility] 10/01/2022 11:30 A M Medical Record Number: [id number] Patient Account Number: [id number] Date of Birth/Sex: Treating RN: 1970-06-12 (52 y.o. [name] Primary Care Shyrl Obi: Marcelino Duster Other Clinician: Referring Carlyle Achenbach: Treating Prentiss Polio/Extender: Joylene Grapes in Treatment: 0 Vital Signs Time Taken: 12:02 Temperature (F): 97.9 Pulse (bpm): 78 Respiratory Rate (breaths/min): 16 Blood Pressure (mmHg): 100/76 Reference Range: 80 - 120 mg / dl Electronic Signature(s) Signed: 10/01/2022 1:05:55 PM By: Midge Aver MSN RN CNS WTA Entered By: Midge Aver on 10/01/2022 09:04:13

## 2022-10-01 NOTE — Progress Notes (Signed)
[place], Beth Carroll ([id number]) [id number] Page 1 of 5 Visit Report for 10/01/2022 Abuse Risk Screen Details Patient Name: Date of Service: Beth Carroll, Beth Carroll [facility] 10/01/2022 11:30 A M Medical Record Number: [id number] Patient Account Number: [id number] Date of Birth/Sex: Treating RN: 1970/06/22 (52 y.o. [name] Primary Care Taysen Bushart: Marcelino Duster Other Clinician: Referring Perry Brucato: Treating Dashanique Brownstein/Extender: Allen Derry Self, Referral Weeks in Treatment: 0 Abuse Risk Screen Items Answer Electronic Signature(s) Signed: 10/01/2022 1:05:55 PM By: Midge Aver MSN RN CNS WTA Entered By: Midge Aver on 10/01/2022 09:04:43 -------------------------------------------------------------------------------- Activities of Daily Living Details Patient Name: Date of Service: Beth Carroll, Beth Carroll 10/01/2022 11:30 A M Medical Record Number: [id number] Patient Account Number: [id number] Date of Birth/Sex: Treating RN: [birth year] (52 y.o. [name] Primary Care Sylena Lotter: Marcelino Duster Other Clinician: Referring Sidni Fusco: Treating Joie Reamer/Extender: Allen Derry Self, Referral Weeks in Treatment: 0 Activities of Daily Living Items Answer Activities of Daily Living (Please select one for each item) Drive Automobile Not Able T Medications ake Completely Able Use T elephone Completely Able Care for Appearance Completely Able Use T oilet Completely Able Bath / Shower Completely Able Dress Self Completely Able Feed Self Completely Able Walk Completely Able Get In / Out Bed Completely Able Housework Completely Able Prepare Meals Completely Able Handle Money Completely Able Shop for Self Completely Neylan Stong, Beth Carroll ([id number]) [id number] Page 2 of 5 Electronic Signature(s) Signed: 10/01/2022 1:05:55 PM By: Midge Aver MSN RN CNS WTA Entered By: Midge Aver on 10/01/2022  09:04:59 -------------------------------------------------------------------------------- Education Screening Details Patient Name: Date of Service: Beth Carroll, Beth Carroll [facility] 10/01/2022 11:30 A M Medical Record Number: [id number] Patient Account Number: [id number] Date of Birth/Sex: Treating RN: [birth year] (52 y.o. [name] Primary Care Hayde Kilgour: Marcelino Duster Other Clinician: Referring Shalayah Beagley: Treating Vlad Mayberry/Extender: Allen Derry Self, Referral Weeks in Treatment: 0 Learning Preferences/Education Level/Primary Language Learning Preference: Explanation, Demonstration Preferred Language: English Cognitive Barrier Language Barrier: No Translator Needed: No Memory Deficit: No Emotional Barrier: No Cultural/Religious Beliefs Affecting Medical Care: No Physical Barrier Impaired Vision: Yes Glasses Impaired Hearing: No Decreased Hand dexterity: No Knowledge/Comprehension Knowledge Level: High Comprehension Level: High Ability to understand written instructions: High Ability to understand verbal instructions: High Motivation Anxiety Level: Calm Cooperation: Cooperative Education Importance: Acknowledges Need Interest in Health Problems: Asks Questions Perception: Coherent Willingness to Engage in Self-Management High Activities: Readiness to Engage in Self-Management High Activities: Electronic Signature(s) Signed: 10/01/2022 1:05:55 PM By: Midge Aver MSN RN CNS WTA Entered By: Midge Aver on 10/01/2022 09:05:25 [name] ([id number]) [id number] Page 3 of 5 -------------------------------------------------------------------------------- Fall Risk Assessment Details Patient Name: Date of Service: Beth Carroll, Beth Carroll [facility] 10/01/2022 11:30 A M Medical Record Number: [id number] Patient Account Number: [id number] Date of Birth/Sex: Treating RN: Jul 09, 1970 (52 y.o. [name] Primary Care Carime Dinkel: Marcelino Duster Other Clinician: Referring  Kerrin Markman: Treating Savonna Birchmeier/Extender: Allen Derry Self, Referral Weeks in Treatment: 0 Fall Risk Assessment Items Have you had 2 or more falls in the last 12 monthso 0 No Have you had any fall that resulted in injury in the last 12 monthso 0 No FALLS RISK SCREEN History of falling - immediate or within 3 months 0 No Secondary diagnosis (Do you have 2 or more medical diagnoseso) 0 No Ambulatory aid None/bed rest/wheelchair/nurse 0 Yes Crutches/cane/walker 15 Yes Furniture 0 No Intravenous therapy Access/Saline/Heparin Lock 0 No Gait/Transferring Normal/ bed rest/ wheelchair 0 No Weak (short steps with or without shuffle, stooped but able to lift head while walking, may seek 0 No support from furniture) Impaired (short  steps with shuffle, may have difficulty arising from chair, head down, impaired 0 No balance) Mental Status Oriented to own ability 0 Yes Electronic Signature(s) Signed: 10/01/2022 1:05:55 PM By: Midge Aver MSN RN CNS WTA Entered By: Midge Aver on 10/01/2022 09:05:56 -------------------------------------------------------------------------------- Foot Assessment Details Patient Name: Date of Service: Beth Carroll, Beth Carroll [facility] 10/01/2022 11:30 A M Medical Record Number: [id number] Patient Account Number: [id number] Date of Birth/Sex: Treating RN: 06-15-1970 (52 y.o. [name] Primary Care Jenaya Saar: Marcelino Duster Other Clinician: Referring Exavier Lina: Treating Renaud Celli/Extender: Allen Derry Self, Referral Weeks in Treatment: 0 Foot Assessment Items Site Locations [place], [place] ([id number]) [phone] [id number] Page 4 of 5 + = Sensation present, - = Sensation absent, C = Callus, U = Ulcer R = Redness, W = Warmth, M = Maceration, PU = Pre-ulcerative lesion F = Fissure, S = Swelling, D = Dryness Assessment Right: Left: Other Deformity: No No Prior Foot Ulcer: Yes No Prior Amputation: No No Charcot Joint: No No Ambulatory Status: Ambulatory  With Help Assistance Device: Walker Gait: Surveyor, mining) Signed: 10/01/2022 1:05:55 PM By: Midge Aver MSN RN CNS WTA Entered By: Midge Aver on 10/01/2022 09:07:07 -------------------------------------------------------------------------------- Nutrition Risk Screening Details Patient Name: Date of Service: Beth Carroll, Beth Carroll 10/01/2022 11:30 A M Medical Record Number: [id number] Patient Account Number: [id number] Date of Birth/Sex: Treating RN: 01-Feb-1970 (52 y.o. [name] Primary Care Zakariah Dejarnette: Marcelino Duster Other Clinician: Referring Tymeshia Awan: Treating Daemien Fronczak/Extender: Allen Derry Self, Referral Weeks in Treatment: 0 Height (in): Weight (lbs): Body Mass Index (BMI): Nutrition Risk Screening Items Score Screening NUTRITION RISK SCREEN: I have an illness or condition that made me change the kind and/or amount of food I eat 0 No I eat fewer than two meals per day 0 No I eat few fruits and vegetables, or milk products 0 No I have three or more drinks of beer, liquor or wine almost every day 0 No I have tooth or mouth problems that make it hard for me to eat 0 No Beth Carroll, Beth Carroll ([id number]) [id number] Page 5 of 5 I don't always have enough money to buy the food I need 0 No I eat alone most of the time 0 No I take three or more different prescribed or over-the-counter drugs a day 1 Yes Without wanting to, I have lost or gained 10 pounds in the last six months 0 No I am not always physically able to shop, cook and/or feed myself 0 No Nutrition Protocols Good Risk Protocol 0 No interventions needed Moderate Risk Protocol High Risk Proctocol Risk Level: Good Risk Score: 1 Electronic Signature(s) Signed: 10/01/2022 1:05:55 PM By: Midge Aver MSN RN CNS WTA Entered By: Midge Aver on 10/01/2022 09:06:08

## 2022-10-06 ENCOUNTER — Ambulatory Visit (INDEPENDENT_AMBULATORY_CARE_PROVIDER_SITE_OTHER): Payer: Self-pay | Admitting: Urology

## 2022-10-06 ENCOUNTER — Other Ambulatory Visit
Admission: RE | Admit: 2022-10-06 | Discharge: 2022-10-06 | Disposition: A | Discharge status: Home / Self Care | Payer: Medicaid Other | Attending: Urology | Admitting: Urology

## 2022-10-06 ENCOUNTER — Telehealth: Payer: Self-pay

## 2022-10-06 ENCOUNTER — Encounter: Payer: Self-pay | Admitting: Urology

## 2022-10-06 ENCOUNTER — Other Ambulatory Visit: Payer: Self-pay | Admitting: *Deleted

## 2022-10-06 VITALS — BP 99/62 | HR 77 | Ht 66.0 in | Wt 118.0 lb

## 2022-10-06 DIAGNOSIS — M545 Low back pain, unspecified: Secondary | ICD-10-CM

## 2022-10-06 DIAGNOSIS — N390 Urinary tract infection, site not specified: Secondary | ICD-10-CM | POA: Diagnosis present

## 2022-10-06 DIAGNOSIS — E1165 Type 2 diabetes mellitus with hyperglycemia: Secondary | ICD-10-CM

## 2022-10-06 DIAGNOSIS — R300 Dysuria: Secondary | ICD-10-CM

## 2022-10-06 DIAGNOSIS — Z87440 Personal history of urinary (tract) infections: Secondary | ICD-10-CM

## 2022-10-06 LAB — URINALYSIS, COMPLETE (UACMP) WITH MICROSCOPIC
RBC / HPF: >50 RBC/hpf (ref 0–5)
WBC, UA: >50 WBC/hpf (ref 0–5)

## 2022-10-06 MED ORDER — CIPROFLOXACIN HCL 500 MG PO TABS
500.0000 mg | ORAL_TABLET | Freq: Two times a day (BID) | ORAL | 0 refills | Status: AC
Start: 2022-10-06 — End: 2022-10-11

## 2022-10-06 MED ORDER — ESTRADIOL 0.1 MG/GM VA CREA
TOPICAL_CREAM | VAGINAL | 12 refills | Status: AC
Start: 2022-10-06 — End: ?

## 2022-10-06 MED ORDER — NITROFURANTOIN MONOHYD MACRO 100 MG PO CAPS
100.0000 mg | ORAL_CAPSULE | Freq: Every day | ORAL | 0 refills | Status: DC
Start: 2022-10-06 — End: 2023-09-09

## 2022-10-06 NOTE — Progress Notes (Signed)
10/06/22 9:10 AM   Beth Carroll [birth year] [id number]  CC: Recurrent UTI  HPI: Extremely comorbid 52 year old female with poorly controlled diabetes, vascular disease who presents with reported recurrent UTIs.  There were minimal prior cultures available in the chart, but culture from February 2024 showed E. coli and Pseudomonas.  Her primary symptoms are dysuria and lower pelvic pain.  She had a CT in March 2024 that showed no renal abnormalities, stones, or other abnormal urologic findings.  Renal function is normal.  She feels like she has a UTI today.  Urinalysis appears grossly infected, 6-10 squamous cells, greater than 50 WBC, greater than 50 RBC, many bacteria, WBC clumps present   PMH: Past Medical History:  Diagnosis Date   Acute deep vein thrombosis (DVT) of right lower extremity (HCC)    ADHD (attention deficit hyperactivity disorder)    AKI (acute kidney injury) (HCC) 04/30/2022   Aortic atherosclerosis (HCC)    COPD (chronic obstructive pulmonary disease) (HCC)    Coronary artery disease 12/15/2020   a.) LHC/PCI 12/15/2020: 99% mLAD (2.25 x 18 mm Onyx Frontier DES), 90% RI (2.75 x 22 mm Onyx Frontier DES), 40% m-dLAD   Diabetes mellitus without complication (HCC)    Diabetes mellitus, type 2 (HCC)    GERD (gastroesophageal reflux disease)    HFrEF (heart failure with reduced ejection fraction) (HCC)    a.) TTE 12/10/2020: EF 40-45%, apical/periapical HK, G2DD; b.) LHC 12/15/2020: EF 35-45%; c.) TTE 06/26/2021: EF 40-45%, LVH, G2DD; d.) TTE 03/21/2022: EF 55-60%, LV/apical sep/ant wall HK, Triv MR, AoV sclerosis   High anion gap metabolic acidosis 04/30/2022   Hyperlipemia    Hypertension    Hypophosphatemia 04/30/2022   Ischemic cardiomyopathy    a.) TTE 12/10/2020: EF 40-45%; b.) LHC 12/15/2020: EF 35-45%; c.) TTE 06/26/2021: EF 40-45%; d.) TTE 03/21/2022: EF 55-60%   Meningioma (HCC)    a.) CT head and MRI brain 08/21/2021: LEFT parietal meningioma    Myocardial infarction (HCC)    Nausea vomiting and diarrhea 04/30/2022   Orthostatic hypotension    PAD (peripheral artery disease) (HCC)    Sepsis secondary to UTI (HCC) 04/30/2022   Tobacco use     Surgical History: Past Surgical History:  Procedure Laterality Date   AMPUTATION Right 06/03/2022   Procedure: AMPUTATION RAY ( 1ST AND 2ND TOE);  Surgeon: Annice Needy, MD;  Location: [facility] OR[facility];  Service: Vascular;  Laterality: Right;   CHOLECYSTECTOMY     CORONARY STENT INTERVENTION N/A 12/15/2020   Procedure: CORONARY STENT INTERVENTION;  Surgeon: Iran Ouch, MD;  Location: [facility];  Service: Cardiovascular;  Laterality: N/A;   INCISION AND DRAINAGE OF WOUND Right 07/09/2022   Procedure: IRRIGATION AND DEBRIDEMENT WOUND;  Surgeon: Linus Galas, DPM;  Location: [facility];  Service: Podiatry;  Laterality: Right;   LEFT HEART CATH AND CORONARY ANGIOGRAPHY N/A 12/15/2020   Procedure: LEFT HEART CATH AND CORONARY ANGIOGRAPHY;  Surgeon: Iran Ouch, MD;  Location: [facility];  Service: Cardiovascular;  Laterality: N/A;   LOWER EXTREMITY ANGIOGRAPHY Left 12/10/2020   Procedure: Lower Extremity Angiography;  Surgeon: Annice Needy, MD;  Location: [facility];  Service: Cardiovascular;  Laterality: Left;   LOWER EXTREMITY ANGIOGRAPHY Right 12/25/2020   Procedure: LOWER EXTREMITY ANGIOGRAPHY;  Surgeon: Annice Needy, MD;  Location: [facility];  Service: Cardiovascular;  Laterality: Right;   LOWER EXTREMITY ANGIOGRAPHY Right 08/19/2021   Procedure: Lower Extremity Angiography;  Surgeon: Annice Needy,  MD;  Location: [facility];  Service: Cardiovascular;  Laterality: Right;   LOWER EXTREMITY ANGIOGRAPHY Right 08/19/2021   Procedure: Lower Extremity Angiography;  Surgeon: Annice Needy, MD;  Location: [facility];  Service: Cardiovascular;  Laterality: Right;   LOWER EXTREMITY ANGIOGRAPHY Right 04/24/2022   Procedure: Lower Extremity  Angiography;  Surgeon: Learta Codding, MD;  Location: [facility];  Service: Cardiovascular;  Laterality: Right;   ORIF FEMUR FRACTURE Right 12/14/2021   Procedure: OPEN REDUCTION INTERNAL FIXATION RIGHT DISTAL FEMUR;  Surgeon: Roby Lofts, MD;  Location: [facility];  Service: Orthopedics;  Laterality: Right;     Family History: Family History  Problem Relation Age of Onset   Hypertension Father     Social History:  reports that she quit smoking about 21 months ago. Her smoking use included cigarettes. She has never been exposed to tobacco smoke. She has never used smokeless tobacco. She reports that she does not drink alcohol. No history on file for drug use.  Physical Exam: BP 99/62   Pulse 77   Ht 5\' 6"  (1.676 m)   Wt 118 lb (53.5 kg)   LMP 01/26/2015   BMI 19.05 kg/m    Constitutional: In wheelchair Cardiovascular: No clubbing, cyanosis, or edema. Respiratory: Normal respiratory effort, no increased work of breathing. GI: Abdomen is soft, nontender, nondistended, no abdominal masses  Laboratory Data: Reviewed, see HPI  Pertinent Imaging: I have personally viewed and interpreted the CT abdomen and pelvis with contrast from March 2024 showing no urologic abnormalities or hydronephrosis.  Assessment & Plan:   Comorbid 52 year old female with poorly controlled diabetes and recurrent UTI, feels like she has a UTI today and urinalysis does appear grossly infected.  Extensive prior hospital records were reviewed.  We discussed the evaluation and treatment of patients with recurrent UTIs at length.  We specifically discussed the differences between asymptomatic bacteriuria and true urinary tract infection.  We discussed the AUA definition of recurrent UTI of at least 2 culture proven symptomatic acute cystitis episodes in a 34-month period, or 3 within a 1 year period.  We discussed the importance of culture directed antibiotic treatment, and antibiotic stewardship.   First-line therapy includes nitrofurantoin(5 days), Bactrim(3 days), or fosfomycin(3 g single dose).  Possible etiologies of recurrent infection include periurethral tissue atrophy in postmenopausal woman, constipation, sexual activity, incomplete emptying, anatomic abnormalities, and even genetic predisposition.  Finally, we discussed the role of perineal hygiene, timed voiding, adequate hydration, topical vaginal estrogen, cranberry prophylaxis, and low-dose antibiotic prophylaxis.  -Recommend 5-day course of Cipro 500 mg twice daily for acute UTI, followed by nitrofurantoin prophylaxis x 90 days and topical estrogen cream, cranberry tablet prophylaxis -RTC with PA 3 to 4 months symptom check  Legrand Rams, MD 10/06/2022  [facility] [address], [address] [place], [place] [address] [phone]

## 2022-10-06 NOTE — Telephone Encounter (Signed)
-----   Message from Sondra Come sent at 10/06/2022 10:05 AM EDT ----- Urine today does appear infected, please send Cipro 500 mg twice daily x 5 days, then she can start the daily nitrofurantoin prophylaxis that was prescribed  Legrand Rams, MD 10/06/2022

## 2022-10-06 NOTE — Telephone Encounter (Signed)
Called pt informed her of the information below. Pt voiced understanding. RX sent.  

## 2022-10-07 LAB — URINE CULTURE

## 2022-10-14 IMAGING — US US ABDOMEN COMPLETE
1 series · 14 of 25 positions shown · non-contrast
Comparison: None.

CLINICAL DATA: Right upper quadrant pain

EXAM:
ABDOMEN ULTRASOUND COMPLETE

[Series 1: us abdomen complete · 0.22mm/px · 14 of 83 slices shown]
[im 1/83]
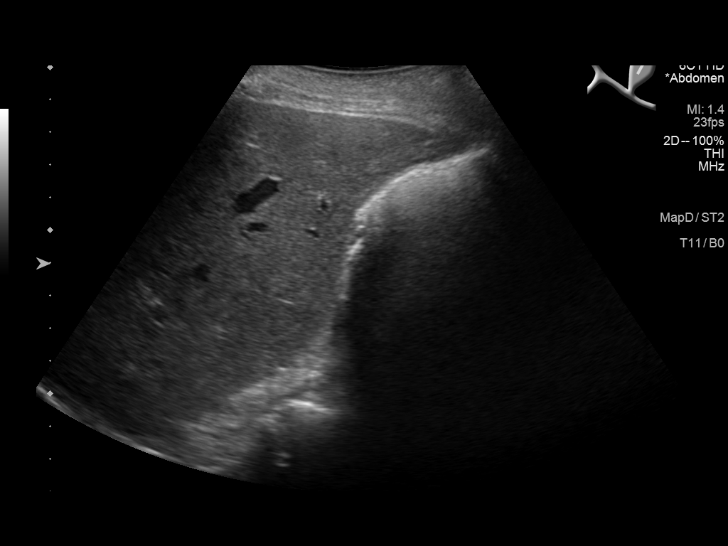
[im 7/83]
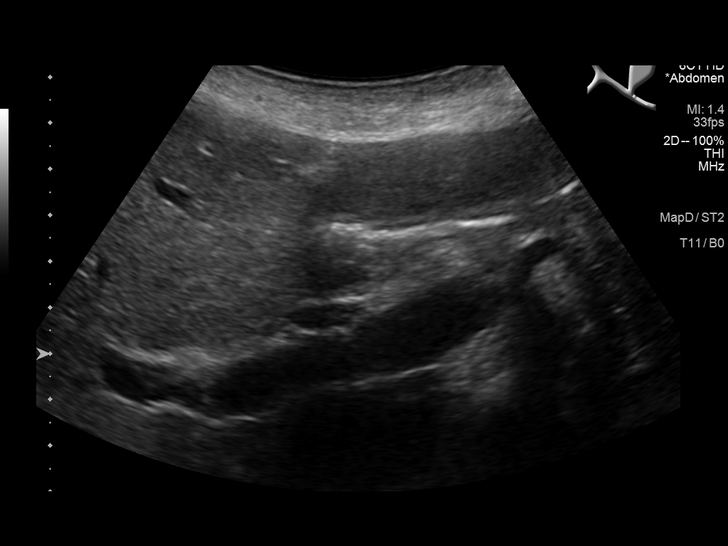
[im 14/83]
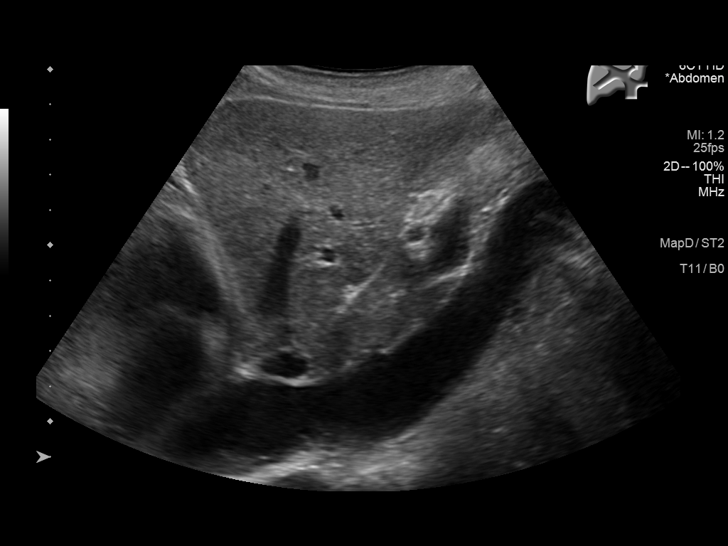
[im 21/83]
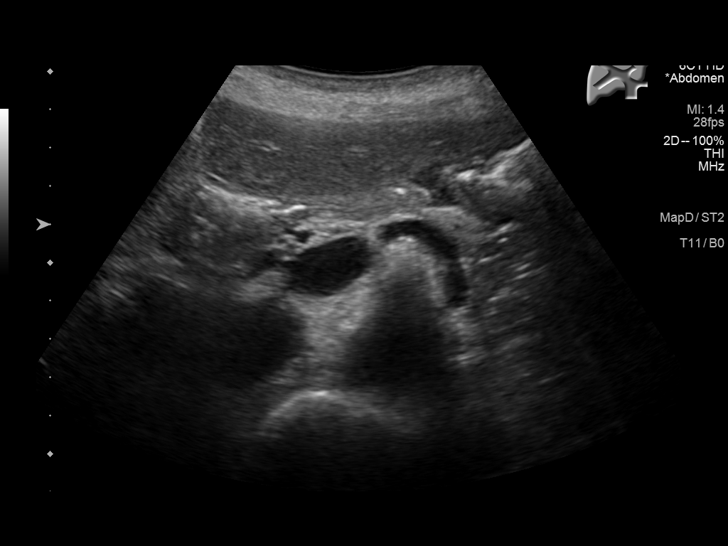
[im 28/83]
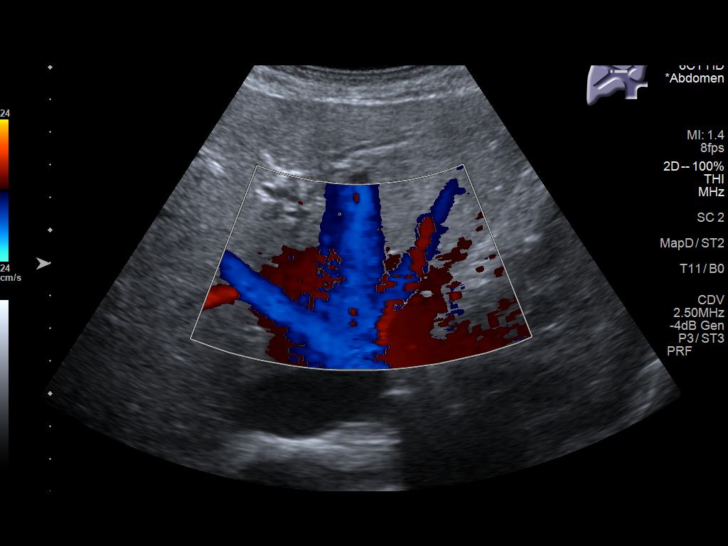
[im 31/83]
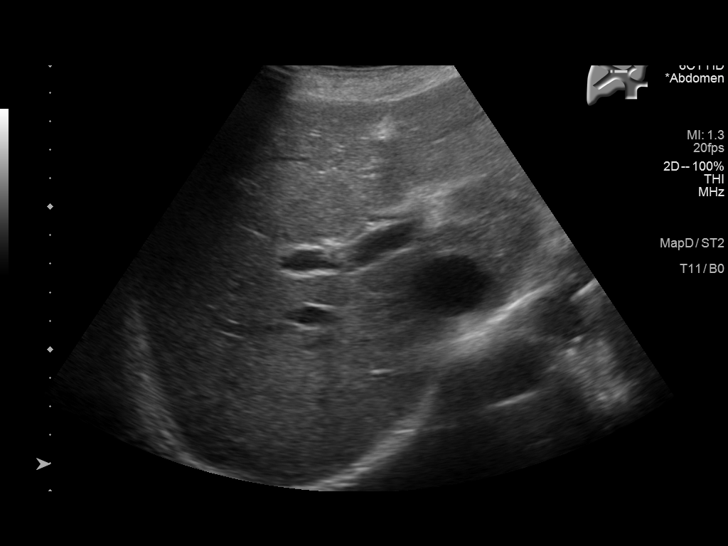
[im 38/83]
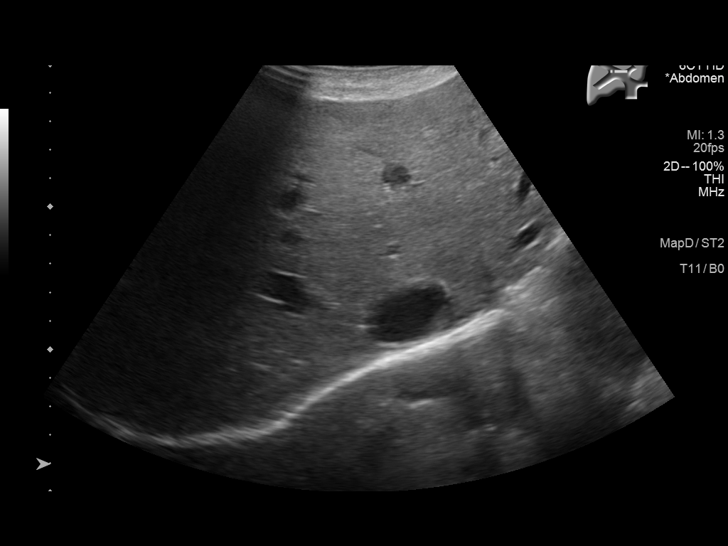
[im 45/83]
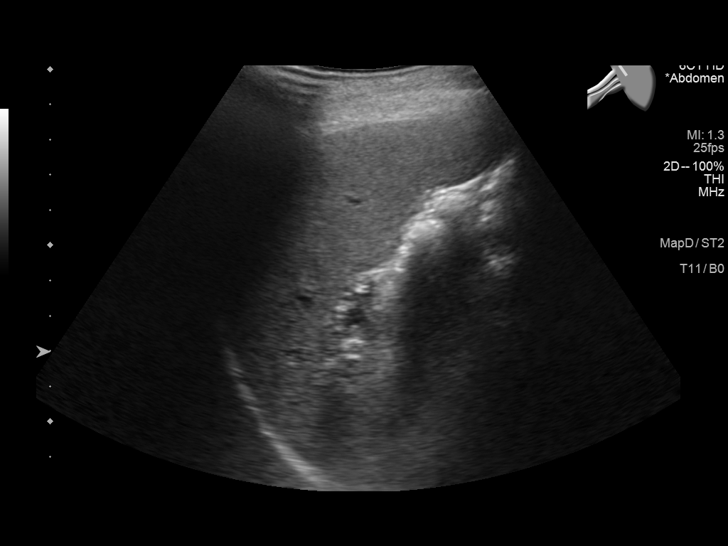
[im 52/83]
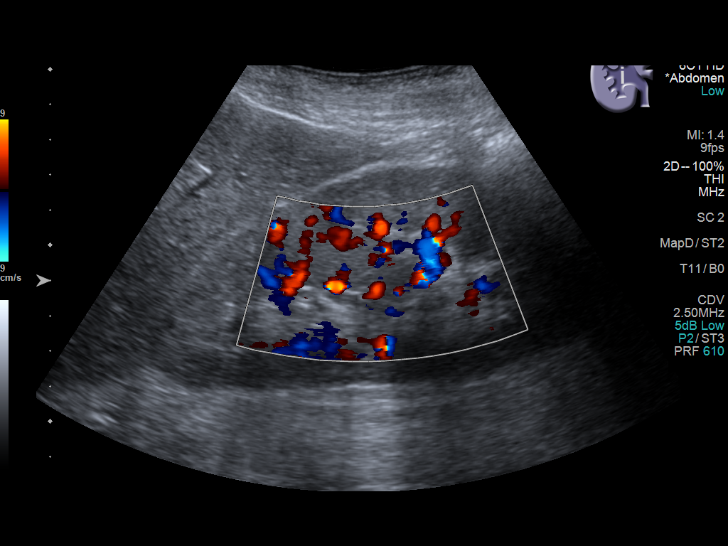
[im 55/83]
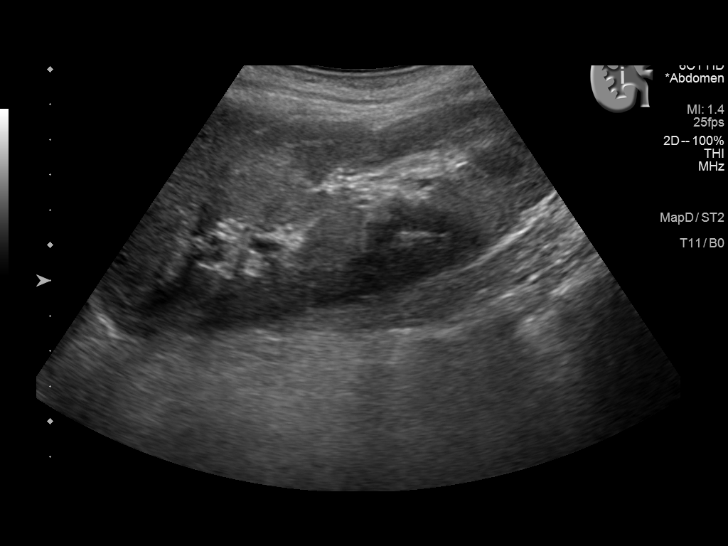
[im 62/83]
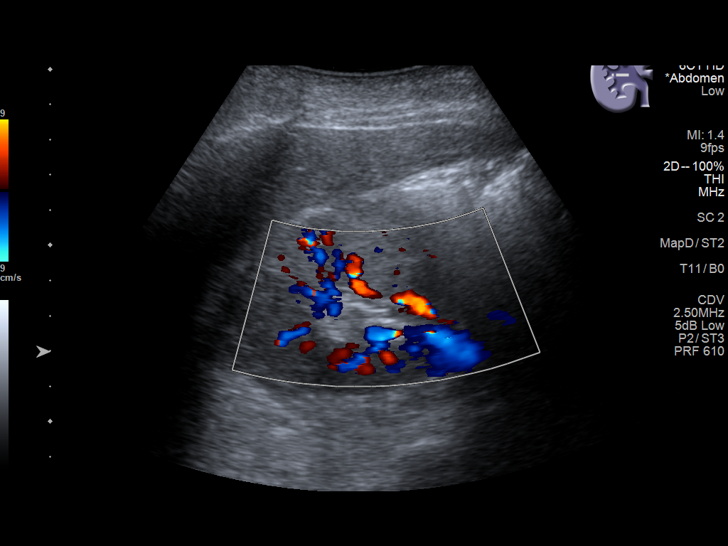
[im 69/83]
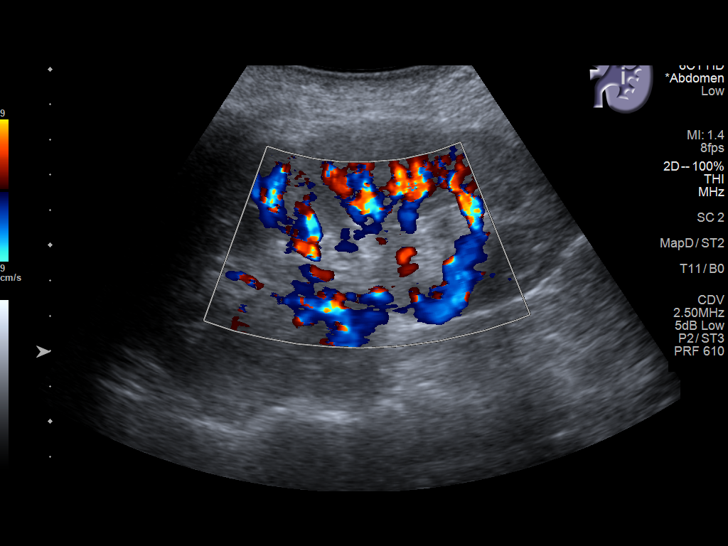
[im 76/83]
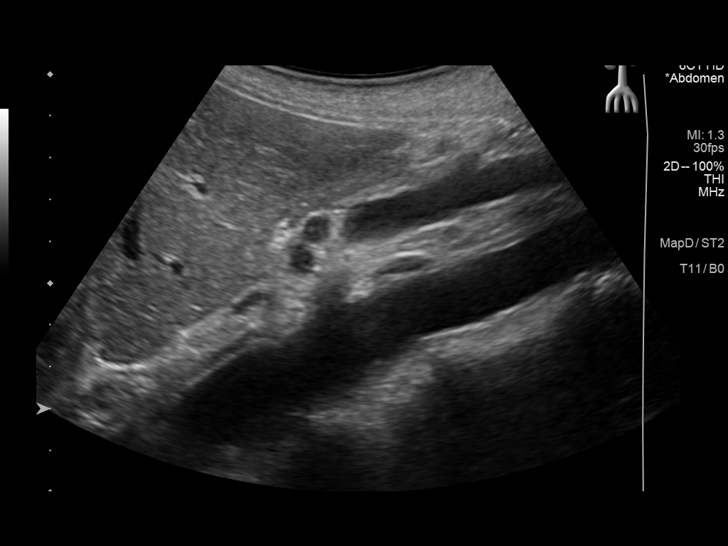
[im 83/83]
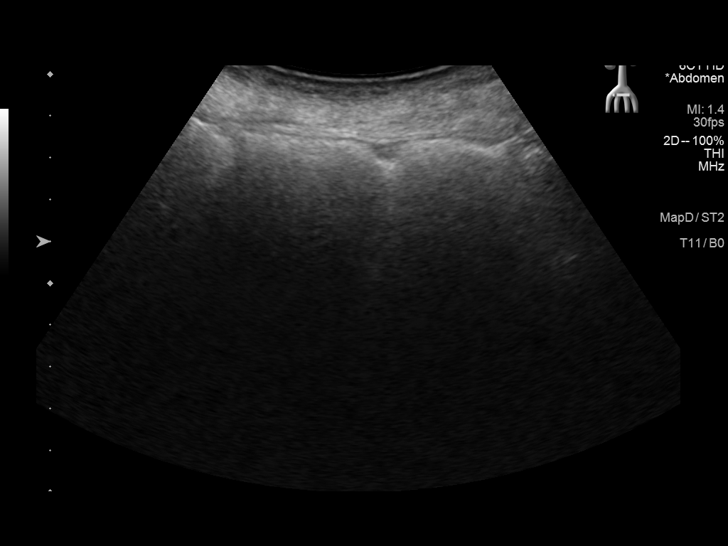

[14 of 25 positions shown; findings below may reference images not displayed]

FINDINGS: Gallbladder: Surgically absent

Common bile duct: Diameter: 5.1 mm

Liver: No focal lesion identified. Within normal limits in
parenchymal echogenicity. Portal vein is patent on color Doppler
imaging with normal direction of blood flow towards the liver.

IVC: No abnormality visualized.

Pancreas: Visualized portion unremarkable.

Spleen: Size and appearance within normal limits.

Right Kidney: Length: 13.4 cm. Echogenicity within normal limits. No
mass or hydronephrosis visualized.

Left Kidney: Length: 12.8 cm. Echogenicity within normal limits. No
mass or hydronephrosis visualized.

Abdominal aorta: No aneurysm visualized.

Other findings: None.
IMPRESSION: Status post cholecystectomy. Otherwise negative abdominal ultrasound

## 2022-10-15 ENCOUNTER — Ambulatory Visit: Payer: Medicaid Other | Admitting: Physician Assistant

## 2022-10-19 ENCOUNTER — Encounter: Payer: Medicaid Other | Admitting: Physician Assistant

## 2022-10-29 ENCOUNTER — Encounter (INDEPENDENT_AMBULATORY_CARE_PROVIDER_SITE_OTHER): Payer: Medicaid Other

## 2022-10-29 ENCOUNTER — Ambulatory Visit (INDEPENDENT_AMBULATORY_CARE_PROVIDER_SITE_OTHER): Payer: Medicaid Other | Admitting: Vascular Surgery

## 2022-11-03 ENCOUNTER — Ambulatory Visit (INDEPENDENT_AMBULATORY_CARE_PROVIDER_SITE_OTHER): Payer: Medicaid Other

## 2022-11-03 DIAGNOSIS — I739 Peripheral vascular disease, unspecified: Secondary | ICD-10-CM

## 2022-11-03 DIAGNOSIS — Z9889 Other specified postprocedural states: Secondary | ICD-10-CM

## 2022-11-03 LAB — VAS US ABI WITH/WO TBI
Left ABI: 1.17
Right ABI: 1.18

## 2022-11-09 ENCOUNTER — Other Ambulatory Visit (INDEPENDENT_AMBULATORY_CARE_PROVIDER_SITE_OTHER): Payer: Self-pay | Admitting: Nurse Practitioner

## 2022-11-09 MED ORDER — APIXABAN 5 MG PO TABS: 5.0000 mg | ORAL_TABLET | Freq: Two times a day (BID) | ORAL | 6 refills | Status: DC

## 2022-11-12 ENCOUNTER — Ambulatory Visit (INDEPENDENT_AMBULATORY_CARE_PROVIDER_SITE_OTHER): Payer: Medicaid Other | Admitting: Vascular Surgery

## 2022-11-12 ENCOUNTER — Encounter (INDEPENDENT_AMBULATORY_CARE_PROVIDER_SITE_OTHER): Payer: Self-pay

## 2022-11-12 IMAGING — CR DG CHEST 2V
1 series · 2 of 2 positions shown · non-contrast
Comparison: None.

CLINICAL DATA: Bilateral lower extremity swelling. History high
blood pressure and diabetes S.

EXAM:
CHEST - 2 VIEW

[Series 1: dg chest 2 view · 0.14mm/px · 2 of 2 slices shown]
[im 1/2]
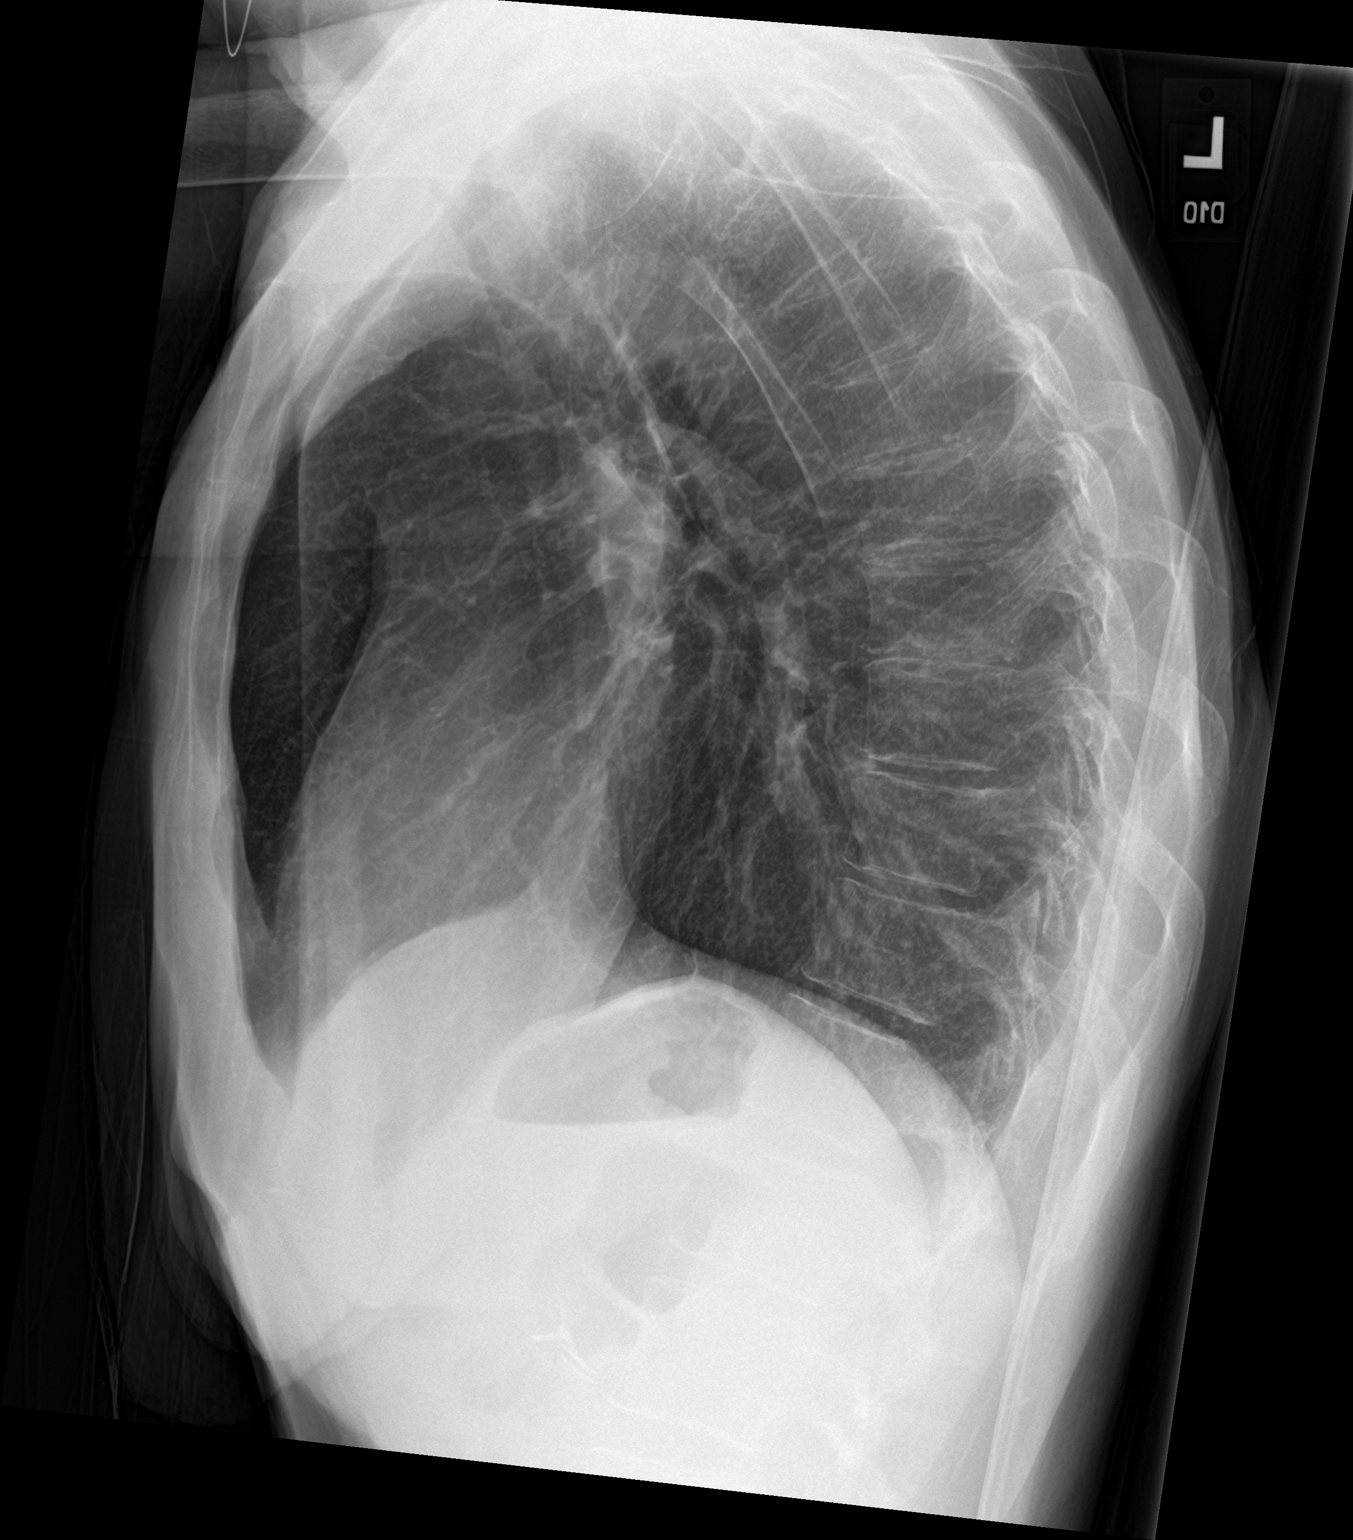
[im 2/2]
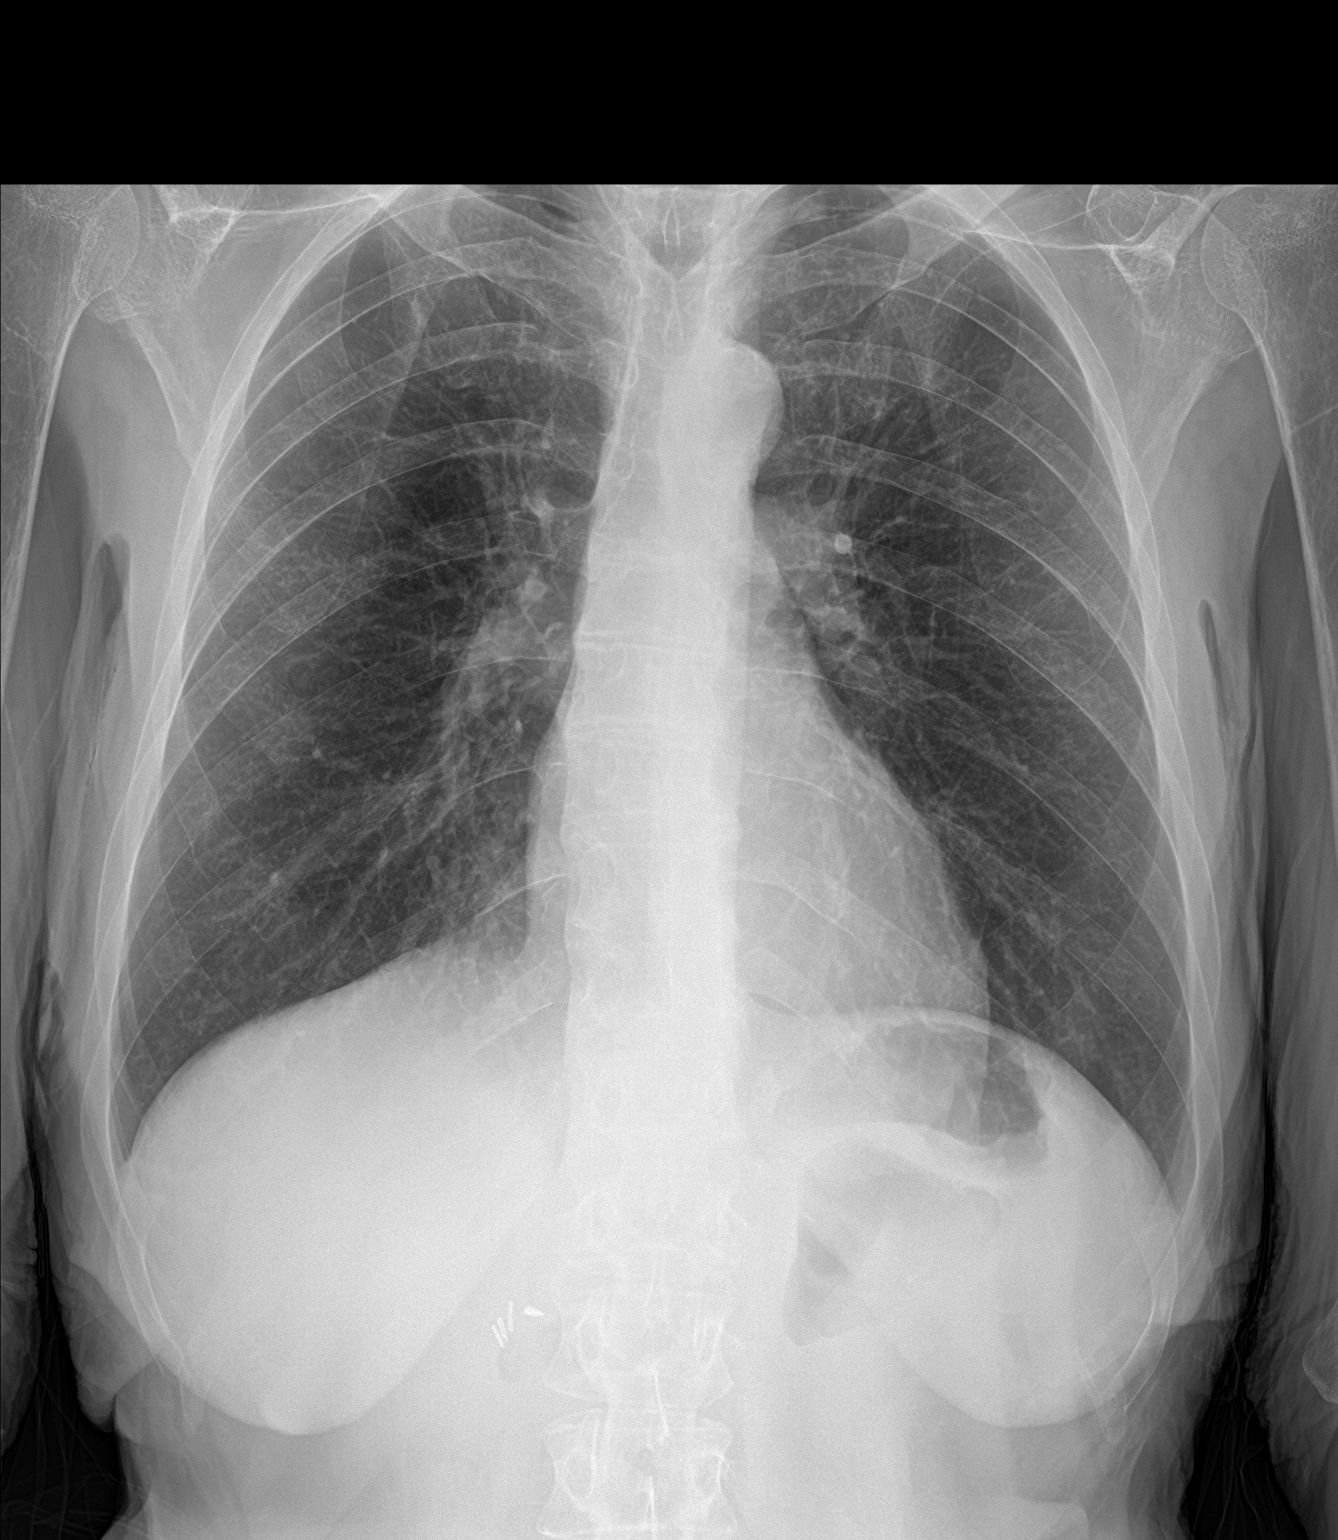

[2 of 2 positions shown; findings below may reference images not displayed]

FINDINGS: The cardiac silhouette, mediastinal and hilar contours are normal.
The lungs are clear of an acute process. Mild hyperinflation. No
pleural effusions or pulmonary lesions. The bony thorax is intact.
IMPRESSION: No acute cardiopulmonary findings.

## 2022-11-15 ENCOUNTER — Telehealth (INDEPENDENT_AMBULATORY_CARE_PROVIDER_SITE_OTHER): Payer: Self-pay

## 2022-11-15 NOTE — Telephone Encounter (Signed)
Patient notified with medical recommendations and requested that we mailed compression prescription to her home.

## 2022-11-15 NOTE — Telephone Encounter (Signed)
Patient left a message requesting for prescription for compression socks and diabetic shoes. Please Advise

## 2022-11-15 NOTE — Telephone Encounter (Signed)
Reach out to the patient but was not able leave a voicemail

## 2022-11-15 NOTE — Telephone Encounter (Signed)
We can send an Rx for compression socks but she would need to discuss diabetic shoes with PCP.  She will need to pick up Rx or we can send to her

## 2022-11-17 ENCOUNTER — Other Ambulatory Visit: Payer: Self-pay

## 2022-11-17 ENCOUNTER — Emergency Department
Admission: EM | Admit: 2022-11-17 | Discharge: 2022-11-17 | Disposition: A | Discharge status: Home / Self Care | Payer: Medicaid Other | Attending: Emergency Medicine | Admitting: Emergency Medicine

## 2022-11-17 ENCOUNTER — Telehealth: Payer: Self-pay | Admitting: Urology

## 2022-11-17 DIAGNOSIS — E119 Type 2 diabetes mellitus without complications: Secondary | ICD-10-CM | POA: Diagnosis not present

## 2022-11-17 DIAGNOSIS — R319 Hematuria, unspecified: Secondary | ICD-10-CM | POA: Diagnosis present

## 2022-11-17 DIAGNOSIS — I10 Essential (primary) hypertension: Secondary | ICD-10-CM | POA: Insufficient documentation

## 2022-11-17 DIAGNOSIS — I2510 Atherosclerotic heart disease of native coronary artery without angina pectoris: Secondary | ICD-10-CM | POA: Insufficient documentation

## 2022-11-17 DIAGNOSIS — R109 Unspecified abdominal pain: Secondary | ICD-10-CM | POA: Insufficient documentation

## 2022-11-17 LAB — CBC
HCT: 32.4 % — ABNORMAL LOW (ref 36.0–46.0)
Hemoglobin: 10.1 g/dL — ABNORMAL LOW (ref 12.0–15.0)
MCH: 27.5 pg (ref 26.0–34.0)
MCHC: 31.2 g/dL (ref 30.0–36.0)
MCV: 88.3 fL (ref 80.0–100.0)
Platelets: 221 10*3/uL (ref 150–400)
RBC: 3.67 MIL/uL — ABNORMAL LOW (ref 3.87–5.11)
RDW: 14.1 % (ref 11.5–15.5)
WBC: 4.3 10*3/uL (ref 4.0–10.5)
nRBC: 0 % (ref 0.0–0.2)

## 2022-11-17 LAB — COMPREHENSIVE METABOLIC PANEL
ALT: 14 U/L (ref 0–44)
AST: 17 U/L (ref 15–41)
Albumin: 3.4 g/dL — ABNORMAL LOW (ref 3.5–5.0)
Alkaline Phosphatase: 80 U/L (ref 38–126)
Anion gap: 7 (ref 5–15)
BUN: 17 mg/dL (ref 6–20)
CO2: 29 mmol/L (ref 22–32)
Calcium: 8.6 mg/dL — ABNORMAL LOW (ref 8.9–10.3)
Chloride: 103 mmol/L (ref 98–111)
Creatinine, Ser: 0.45 mg/dL (ref 0.44–1.00)
GFR, Estimated: >60 mL/min (ref >60)
Glucose, Bld: 215 mg/dL — ABNORMAL HIGH (ref 70–99)
Potassium: 3.8 mmol/L (ref 3.5–5.1)
Sodium: 139 mmol/L (ref 135–145)
Total Bilirubin: 0.5 mg/dL (ref 0.3–1.2)
Total Protein: 6.8 g/dL (ref 6.5–8.1)

## 2022-11-17 LAB — URINALYSIS, ROUTINE W REFLEX MICROSCOPIC

## 2022-11-17 MED ORDER — ACETAMINOPHEN 325 MG PO TABS: 650.0000 mg | ORAL_TABLET | Freq: Once | ORAL | Status: DC

## 2022-11-17 MED ORDER — CIPROFLOXACIN HCL 500 MG PO TABS: 500.0000 mg | ORAL_TABLET | Freq: Two times a day (BID) | ORAL | 0 refills | Status: AC

## 2022-11-17 NOTE — ED Provider Notes (Signed)
[facility] Provider Note    Event Date/Time   First MD Initiated Contact with Patient 11/17/22 1448     (approximate)   History   Hematuria   HPI  Beth Carroll is a 52 y.o. female  with PMH of diabetes, hypertension, CAD, HFrEF and recurrent UTIs presents for evaluation of hematuria. Patient states this is the first time she has had bloody urine and describes large clots in her urine.  She is followed by urology, Dr. Richardo Hanks and has been taking nitrofurantoin and cranberry pills daily for the past month.  She endorses some right flank pain but denies urinary symptoms and fever.     Physical Exam   Triage Vital Signs: ED Triage Vitals  Encounter Vitals Group     BP 11/17/22 1422 95/67     Systolic BP Percentile --      Diastolic BP Percentile --      Pulse Rate 11/17/22 1422 79     Resp 11/17/22 1422 17     Temp 11/17/22 1427 98.5 F (36.9 C)     Temp Source 11/17/22 1427 Oral     SpO2 11/17/22 1422 99 %     Weight 11/17/22 1413 118 lb (53.5 kg)     Height 11/17/22 1413 5\' 6"  (1.676 m)     Head Circumference --      Peak Flow --      Pain Score 11/17/22 1413 6     Pain Loc --      Pain Education --      Exclude from Growth Chart --     Most recent vital signs: Vitals:   11/17/22 1422 11/17/22 1427  BP: 95/67   Pulse: 79   Resp: 17   Temp:  98.5 F (36.9 C)  SpO2: 99%     General: Awake, no distress.  CV:  Good peripheral perfusion.  RRR. Resp:  Normal effort.  CTAB. Abd:  No distention.  No CVA tenderness.   ED Results / Procedures / Treatments   Labs (all labs ordered are listed, but only abnormal results are displayed) Labs Reviewed  URINALYSIS, ROUTINE W REFLEX MICROSCOPIC - Abnormal; Notable for the following components:      Result Value   Color, Urine RED (*)    APPearance TURBID (*)    Glucose, UA   (*)    Value: TEST NOT REPORTED DUE TO COLOR INTERFERENCE OF URINE PIGMENT   Hgb urine dipstick   (*)    Value:  TEST NOT REPORTED DUE TO COLOR INTERFERENCE OF URINE PIGMENT   Bilirubin Urine   (*)    Value: TEST NOT REPORTED DUE TO COLOR INTERFERENCE OF URINE PIGMENT   Ketones, ur   (*)    Value: TEST NOT REPORTED DUE TO COLOR INTERFERENCE OF URINE PIGMENT   Protein, ur   (*)    Value: TEST NOT REPORTED DUE TO COLOR INTERFERENCE OF URINE PIGMENT   Nitrite   (*)    Value: TEST NOT REPORTED DUE TO COLOR INTERFERENCE OF URINE PIGMENT   Leukocytes,Ua   (*)    Value: TEST NOT REPORTED DUE TO COLOR INTERFERENCE OF URINE PIGMENT   All other components within normal limits  CBC - Abnormal; Notable for the following components:   RBC 3.67 (*)    Hemoglobin 10.1 (*)    HCT 32.4 (*)    All other components within normal limits  COMPREHENSIVE METABOLIC PANEL - Abnormal; Notable for the following components:  Glucose, Bld 215 (*)    Calcium 8.6 (*)    Albumin 3.4 (*)    All other components within normal limits    PROCEDURES:  Critical Care performed: No  Procedures   MEDICATIONS ORDERED IN ED: Medications  acetaminophen (TYLENOL) tablet 650 mg (has no administration in time range)     IMPRESSION / MDM / ASSESSMENT AND PLAN / ED COURSE  I reviewed the triage vital signs and the nursing notes.                             52 year old female presents for evaluation of gross hematuria.  Vital signs stable in triage patient NAD on exam.  Differential diagnosis includes, but is not limited to, UTI, nephrolithiasis, neoplasm.  Patient's presentation is most consistent with acute complicated illness / injury requiring diagnostic workup.  CBC shows mild anemia which is improved from patient's previous blood work, CMP unremarkable.  Urinalysis was difficult to interpret due to the large amount of blood in the urine.  I discussed findings with my attending physician and I will treat patient for a UTI.  Patient was instructed to follow-up with urology.  She voiced understanding, all questions were  answered and she was stable at discharge.      FINAL CLINICAL IMPRESSION(S) / ED DIAGNOSES   Final diagnoses:  Hematuria, unspecified type     Rx / DC Orders   ED Discharge Orders          Ordered    ciprofloxacin (CIPRO) 500 MG tablet  2 times daily        11/17/22 1714             Note:  This document was prepared using Dragon voice recognition software and may include unintentional dictation errors.   Cameron Ali, PA-C 11/17/22 1716    Jene Every, MD 11/17/22 [phone]

## 2022-11-17 NOTE — ED Triage Notes (Signed)
Pt to ED for Uti, right flank pain and hematuria that is worsening. Reports recently treated for UTI.

## 2022-11-17 NOTE — Telephone Encounter (Signed)
sent 

## 2022-11-17 NOTE — Telephone Encounter (Signed)
Attempted to call pt to schedule appt. Pt currently in ED seeking treatment.

## 2022-11-17 NOTE — Telephone Encounter (Signed)
Please advise if pt needs to be scheduled for hematuria work up or UTI appt.

## 2022-11-17 NOTE — Discharge Instructions (Addendum)
Follow-up with your urologist.  Take the antibiotics as prescribed.

## 2022-11-17 NOTE — ED Notes (Signed)
See triage notes. Patient c/o uti, right flank pain and hematuria

## 2022-11-17 NOTE — Telephone Encounter (Signed)
Pt called to let Dr Richardo Hanks know nothing has changed since she's been taking Macrobid, she still has blood in her urine.  She wants to know if she should come in for an appt, or if meds should be changed.

## 2022-11-22 NOTE — Telephone Encounter (Signed)
Patient called to confirm Rx has been sent via mail.  I let her know it was sent 11/17/22 and it should be arriving soon

## 2022-12-04 NOTE — Progress Notes (Deleted)
Cardiology Clinic Note   Date: 12/04/2022 ID: Beth Carroll, DOB 07/14/1970, MRN [id number]  Primary Cardiologist:  Beth Nordmann, MD  Patient Profile    Beth Carroll is a 52 y.o. female who presents to the clinic today for ***    Past medical history significant for: CAD. LHC 12/15/2020 (LV dysfunction): Mid LAD 99%.  Proximal ramus 90%.  Mid to distal LAD 40%.  PCI with DES 2.25 x 18 mm to mid LAD, DES 2.75 x 22 mm to proximal ramus.  Moderately reduced LV systolic function with akinesis of mid to distal anterior and apical myocardium.  Normal LVEDP. Chronic combined systolic and diastolic heart failure. Echo 03/21/2022: EF 55 to 60%, akinesis of the left ventricular, apical septal wall and anterior wall.  Normal diastolic parameters.  Normal RV function.  Normal PA pressure.  Trivial MR.  Aortic valve sclerosis/calcification without stenosis. PAF. PAD. Lower extremity angiography 12/10/2020: PTA left anterior tibial artery, left posterior tibial artery.  Mechanical thrombectomy left SFA and popliteal arteries.  Angioplasty of left SFA and popliteal arteries.  Stent placement x 2 left SFA and above-knee popliteal arteries. Lower extremity angiography 12/25/2020: Mechanical thrombectomy right SFA and proximal popliteal artery.  PTA right SFA and above-knee popliteal artery.  Stent placement x 2 right SFA and popliteal arteries. Lower extremity angiography 08/19/2021: Mechanical thrombectomy right SFA, popliteal artery, tibioperoneal trunk, proximal posterior tibial artery.  Stent placement right popliteal artery.  PTA right posterior tibial artery.  Stent placement right SFA proximally. Lower extremity angiography 04/24/2022: PTA right posterior tibial artery.  Thrombectomy right common femoral artery, superficial femoral distribution, popliteal, right deep femoral origin.  PTA right SFA and common femoral, right deep femoral origin. 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 ABI/right  LEA 11/03/2022: Normal ABI/TBI bilaterally.  Biphasic waveforms right lower extremity. Hypertension. Hyperlipidemia. Lipid panel 08/22/2021: LDL 45, HDL 47, TG 74, total 107. COPD. GERD. T2DM. Tobacco abuse.  In summary, patient underwent hospital admission November 2022 for dizziness, nausea, poor appetite.  Upon arrival to ED, she was hypertensive with SBP in the 160s.  Initial labs: Potassium 2.4, BNP 806, troponin 28 with a delta of 27.  EKG showed sinus rhythm with possible prior anterior infarct.  Chest x-ray without acute findings.  ABIs performed in the ED showed very poor waveforms.  She was started on heparin vascular surgery was consulted.  On 12/10/2020 she underwent lower extremity intervention (as detailed above).  Echo showed EF 40 to 45%, apical and periapical hypokinesis with wall motion best preserved along the basal-mid wall, Grade II DD, normal RV function, RA pressure 15 mmHg with no significant valvular abnormalities.  Cardiology consulted for abnormal echo and it was recommended she undergo heart catheterization.  She underwent PCI with DES to mid LAD and DES to proximal ramus.  Patient has an extensive history of PAD with multiple interventions as outlined above.  She is followed by vascular surgery.     History of Present Illness    Beth Carroll is followed by Dr. Mariah Carroll for the above outlined history.  Patient was last seen in the office by Beth Furth, PA-C on 05/29/2021 for routine follow-up.  At that time she complained of right foot redness and swelling without pain, numbness or tingling.  She reported very little feeling in feet secondary to diabetes.  Feet pulse was palpated.  Her BP was elevated but she had not taken her medication.  Vascular ultrasound revealed no DVT.  Echo performed June 2023 showed no  significant change from prior echo November 2022.  Discussed the use of AI scribe software for clinical note transcription with the patient, who gave verbal consent  to proceed.         CAD S/p PCI with DES to mid LAD and DES to proximal ramus November 2022.  Patient*** -Continue aspirin, atorvastatin.  Chronic combined systolic and diastolic heart failure Echo February 2024 showed normal biventricular function, wall motion abnormalities, trivial MR.  Patient*** Euvolemic and well compensated on exam.  -Continue***  Hypertension BP today*** -Continue***  Hyperlipidemia LDL July 2023 45, at goal.  -Continue atorvastatin.***  Tobacco abuse Patient***   ROS: All other systems reviewed and are otherwise negative except as noted in History of Present Illness.  Studies Reviewed       ***  Risk Assessment/Calculations    {Does this patient have ATRIAL FIBRILLATION?:[id number]} No BP recorded.  {Refresh Note OR Click here to enter BP  :1}***        Physical Exam    VS:  LMP 01/26/2015  , BMI There is no height or weight on file to calculate BMI.  GEN: Well nourished, well developed, in no acute distress. Neck: No JVD or carotid bruits. Cardiac: *** RRR. No murmurs. No rubs or gallops.   Respiratory:  Respirations regular and unlabored. Clear to auscultation without rales, wheezing or rhonchi. GI: Soft, nontender, nondistended. Extremities: Radials/DP/PT 2+ and equal bilaterally. No clubbing or cyanosis. No edema ***  Skin: Warm and dry, no rash. Neuro: Strength intact.  Assessment & Plan   Assessment and Plan               Disposition: ***     {Are you ordering a CV Procedure (e.g. stress test, cath, DCCV, TEE, etc)?   Press F2        :[id number]}   Signed, Beth Carroll. Beth Moll, DNP, NP-C

## 2022-12-05 ENCOUNTER — Other Ambulatory Visit: Payer: Self-pay

## 2022-12-05 ENCOUNTER — Inpatient Hospital Stay
Admission: EM | Admit: 2022-12-05 | Discharge: 2022-12-06 | DRG: 313 | Disposition: A | Discharge status: Home / Self Care | Payer: Medicaid Other | Attending: Family Medicine | Admitting: Family Medicine

## 2022-12-05 ENCOUNTER — Emergency Department: Payer: Medicaid Other

## 2022-12-05 DIAGNOSIS — Z8249 Family history of ischemic heart disease and other diseases of the circulatory system: Secondary | ICD-10-CM

## 2022-12-05 DIAGNOSIS — I2510 Atherosclerotic heart disease of native coronary artery without angina pectoris: Secondary | ICD-10-CM | POA: Diagnosis not present

## 2022-12-05 DIAGNOSIS — J449 Chronic obstructive pulmonary disease, unspecified: Secondary | ICD-10-CM | POA: Diagnosis present

## 2022-12-05 DIAGNOSIS — Z89421 Acquired absence of other right toe(s): Secondary | ICD-10-CM | POA: Diagnosis not present

## 2022-12-05 DIAGNOSIS — Z86011 Personal history of benign neoplasm of the brain: Secondary | ICD-10-CM | POA: Diagnosis not present

## 2022-12-05 DIAGNOSIS — F909 Attention-deficit hyperactivity disorder, unspecified type: Secondary | ICD-10-CM | POA: Diagnosis present

## 2022-12-05 DIAGNOSIS — I5030 Unspecified diastolic (congestive) heart failure: Secondary | ICD-10-CM | POA: Diagnosis not present

## 2022-12-05 DIAGNOSIS — E1165 Type 2 diabetes mellitus with hyperglycemia: Secondary | ICD-10-CM | POA: Diagnosis present

## 2022-12-05 DIAGNOSIS — E785 Hyperlipidemia, unspecified: Secondary | ICD-10-CM | POA: Diagnosis present

## 2022-12-05 DIAGNOSIS — E1151 Type 2 diabetes mellitus with diabetic peripheral angiopathy without gangrene: Secondary | ICD-10-CM | POA: Diagnosis present

## 2022-12-05 DIAGNOSIS — I5042 Chronic combined systolic (congestive) and diastolic (congestive) heart failure: Secondary | ICD-10-CM | POA: Diagnosis present

## 2022-12-05 DIAGNOSIS — R079 Chest pain, unspecified: Principal | ICD-10-CM | POA: Diagnosis present

## 2022-12-05 DIAGNOSIS — I739 Peripheral vascular disease, unspecified: Secondary | ICD-10-CM | POA: Diagnosis present

## 2022-12-05 DIAGNOSIS — Z7902 Long term (current) use of antithrombotics/antiplatelets: Secondary | ICD-10-CM | POA: Diagnosis not present

## 2022-12-05 DIAGNOSIS — I200 Unstable angina: Secondary | ICD-10-CM

## 2022-12-05 DIAGNOSIS — I10 Essential (primary) hypertension: Secondary | ICD-10-CM | POA: Diagnosis not present

## 2022-12-05 DIAGNOSIS — E1169 Type 2 diabetes mellitus with other specified complication: Secondary | ICD-10-CM

## 2022-12-05 DIAGNOSIS — I255 Ischemic cardiomyopathy: Secondary | ICD-10-CM | POA: Diagnosis present

## 2022-12-05 DIAGNOSIS — Z955 Presence of coronary angioplasty implant and graft: Secondary | ICD-10-CM | POA: Diagnosis not present

## 2022-12-05 DIAGNOSIS — Z79899 Other long term (current) drug therapy: Secondary | ICD-10-CM

## 2022-12-05 DIAGNOSIS — I252 Old myocardial infarction: Secondary | ICD-10-CM

## 2022-12-05 DIAGNOSIS — Z794 Long term (current) use of insulin: Secondary | ICD-10-CM

## 2022-12-05 DIAGNOSIS — K219 Gastro-esophageal reflux disease without esophagitis: Secondary | ICD-10-CM | POA: Diagnosis present

## 2022-12-05 DIAGNOSIS — Z86718 Personal history of other venous thrombosis and embolism: Secondary | ICD-10-CM | POA: Diagnosis not present

## 2022-12-05 DIAGNOSIS — I110 Hypertensive heart disease with heart failure: Secondary | ICD-10-CM | POA: Diagnosis present

## 2022-12-05 DIAGNOSIS — R0789 Other chest pain: Principal | ICD-10-CM | POA: Diagnosis present

## 2022-12-05 DIAGNOSIS — I700 Atherosclerosis of aorta: Secondary | ICD-10-CM | POA: Diagnosis present

## 2022-12-05 DIAGNOSIS — Z7901 Long term (current) use of anticoagulants: Secondary | ICD-10-CM

## 2022-12-05 DIAGNOSIS — I25110 Atherosclerotic heart disease of native coronary artery with unstable angina pectoris: Secondary | ICD-10-CM | POA: Diagnosis present

## 2022-12-05 DIAGNOSIS — I4891 Unspecified atrial fibrillation: Secondary | ICD-10-CM | POA: Diagnosis present

## 2022-12-05 DIAGNOSIS — Z7982 Long term (current) use of aspirin: Secondary | ICD-10-CM | POA: Diagnosis not present

## 2022-12-05 DIAGNOSIS — Z87891 Personal history of nicotine dependence: Secondary | ICD-10-CM | POA: Diagnosis not present

## 2022-12-05 LAB — BASIC METABOLIC PANEL
Anion gap: 7 (ref 5–15)
BUN: 15 mg/dL (ref 6–20)
CO2: 28 mmol/L (ref 22–32)
Calcium: 9 mg/dL (ref 8.9–10.3)
Chloride: 101 mmol/L (ref 98–111)
Creatinine, Ser: 0.53 mg/dL (ref 0.44–1.00)
GFR, Estimated: >60 mL/min (ref >60)
Glucose, Bld: 285 mg/dL — ABNORMAL HIGH (ref 70–99)
Potassium: 4 mmol/L (ref 3.5–5.1)
Sodium: 136 mmol/L (ref 135–145)

## 2022-12-05 LAB — CBC
HCT: 36.5 % (ref 36.0–46.0)
Hemoglobin: 11.3 g/dL — ABNORMAL LOW (ref 12.0–15.0)
MCH: 27.4 pg (ref 26.0–34.0)
MCHC: 31 g/dL (ref 30.0–36.0)
MCV: 88.6 fL (ref 80.0–100.0)
Platelets: 262 10*3/uL (ref 150–400)
RBC: 4.12 MIL/uL (ref 3.87–5.11)
RDW: 13.7 % (ref 11.5–15.5)
WBC: 4.2 10*3/uL (ref 4.0–10.5)
nRBC: 0 % (ref 0.0–0.2)

## 2022-12-05 LAB — HEMOGLOBIN A1C
Hgb A1c MFr Bld: 8.6 % — ABNORMAL HIGH (ref 4.8–5.6)
Mean Plasma Glucose: 200.12 mg/dL

## 2022-12-05 LAB — TROPONIN I (HIGH SENSITIVITY)
Troponin I (High Sensitivity): 10 ng/L (ref <18)
Troponin I (High Sensitivity): 8 ng/L (ref <18)

## 2022-12-05 LAB — CBG MONITORING, ED
Glucose-Capillary: 346 mg/dL — ABNORMAL HIGH (ref 70–99)
Glucose-Capillary: 398 mg/dL — ABNORMAL HIGH (ref 70–99)

## 2022-12-05 MED ORDER — APIXABAN 5 MG PO TABS
5.0000 mg | ORAL_TABLET | Freq: Two times a day (BID) | ORAL | Status: DC
  Administered 2022-12-05 – 2022-12-06 (×2): 5 mg via ORAL
  Filled 2022-12-05 (×2): qty 1

## 2022-12-05 MED ORDER — ENOXAPARIN SODIUM 40 MG/0.4ML IJ SOSY: 40.0000 mg | PREFILLED_SYRINGE | INTRAMUSCULAR | Status: DC

## 2022-12-05 MED ORDER — INSULIN ASPART 100 UNIT/ML IJ SOLN
3.0000 [IU] | Freq: Three times a day (TID) | INTRAMUSCULAR | Status: DC
  Administered 2022-12-05: 3 [IU] via SUBCUTANEOUS
  Filled 2022-12-05 (×2): qty 1

## 2022-12-05 MED ORDER — ACETAMINOPHEN 325 MG PO TABS: 650.0000 mg | ORAL_TABLET | ORAL | Status: DC | PRN

## 2022-12-05 MED ORDER — INSULIN GLARGINE-YFGN 100 UNIT/ML ~~LOC~~ SOLN: 20.0000 [IU] | Freq: Every day | SUBCUTANEOUS | Status: DC

## 2022-12-05 MED ORDER — NITROFURANTOIN MONOHYD MACRO 100 MG PO CAPS: 100.0000 mg | ORAL_CAPSULE | Freq: Every day | ORAL | Status: DC

## 2022-12-05 MED ORDER — ATORVASTATIN CALCIUM 80 MG PO TABS
80.0000 mg | ORAL_TABLET | Freq: Every day | ORAL | Status: DC
  Administered 2022-12-05 – 2022-12-06 (×2): 80 mg via ORAL
  Filled 2022-12-05 (×2): qty 4

## 2022-12-05 MED ORDER — INSULIN ASPART 100 UNIT/ML IJ SOLN
0.0000 [IU] | Freq: Three times a day (TID) | INTRAMUSCULAR | Status: DC
  Administered 2022-12-05 – 2022-12-06 (×2): 5 [IU] via SUBCUTANEOUS
  Administered 2022-12-06: 7 [IU] via SUBCUTANEOUS
  Filled 2022-12-05 (×3): qty 1

## 2022-12-05 MED ORDER — ONDANSETRON HCL 4 MG/2ML IJ SOLN: 4.0000 mg | Freq: Four times a day (QID) | INTRAMUSCULAR | Status: DC | PRN

## 2022-12-05 MED ORDER — ACETAMINOPHEN 325 MG PO TABS: 650.0000 mg | ORAL_TABLET | Freq: Three times a day (TID) | ORAL | Status: DC | PRN

## 2022-12-05 MED ORDER — ALBUTEROL SULFATE (2.5 MG/3ML) 0.083% IN NEBU: 3.0000 mL | INHALATION_SOLUTION | Freq: Four times a day (QID) | RESPIRATORY_TRACT | Status: DC | PRN

## 2022-12-05 MED ORDER — INSULIN GLARGINE-YFGN 100 UNIT/ML ~~LOC~~ SOLN
25.0000 [IU] | Freq: Every day | SUBCUTANEOUS | Status: DC
  Filled 2022-12-05: qty 0.25

## 2022-12-05 MED ORDER — NITROGLYCERIN 0.4 MG SL SUBL
0.4000 mg | SUBLINGUAL_TABLET | SUBLINGUAL | Status: DC | PRN
  Administered 2022-12-05 – 2022-12-06 (×2): 0.4 mg via SUBLINGUAL
  Filled 2022-12-05 (×2): qty 1

## 2022-12-05 MED ORDER — ASPIRIN 81 MG PO TBEC
81.0000 mg | DELAYED_RELEASE_TABLET | Freq: Every day | ORAL | Status: DC
  Administered 2022-12-06: 81 mg via ORAL
  Filled 2022-12-05 (×2): qty 1

## 2022-12-05 MED ORDER — VITAMIN C 500 MG PO TABS
500.0000 mg | ORAL_TABLET | Freq: Two times a day (BID) | ORAL | Status: DC
  Administered 2022-12-05 – 2022-12-06 (×2): 500 mg via ORAL
  Filled 2022-12-05 (×2): qty 1

## 2022-12-05 MED ORDER — PANTOPRAZOLE SODIUM 40 MG PO TBEC
40.0000 mg | DELAYED_RELEASE_TABLET | Freq: Every day | ORAL | Status: DC
  Administered 2022-12-05 – 2022-12-06 (×2): 40 mg via ORAL
  Filled 2022-12-05 (×2): qty 1

## 2022-12-05 MED ORDER — ASPIRIN 81 MG PO CHEW
324.0000 mg | CHEWABLE_TABLET | Freq: Once | ORAL | Status: AC
  Administered 2022-12-05: 324 mg via ORAL
  Filled 2022-12-05: qty 4

## 2022-12-05 NOTE — Assessment & Plan Note (Signed)
Stable from a resp standpoint  Cont home inhalers   

## 2022-12-05 NOTE — ED Provider Notes (Signed)
[facility] Provider Note    Event Date/Time   First MD Initiated Contact with Patient 12/05/22 1248     (approximate)   History   Chest Pain   HPI  Beth Carroll is a 52 y.o. female past medical history significant for CAD, HFpEF, presents to the emergency department with chest pain.  Patient [name] is left-sided chest pain that has been intermittent over the past 1 month.  States that it has been worse whenever she has been walking around.  Ongoing pain today which brought her into the emergency department because over the past 2 days she has been feeling short of breath.  Denies nausea or vomiting.  Denies cough.  States that she has been compliant with all of her home medications.     Physical Exam   Triage Vital Signs: ED Triage Vitals  Encounter Vitals Group     BP 12/05/22 1148 100/73     Systolic BP Percentile --      Diastolic BP Percentile --      Pulse Rate 12/05/22 1148 80     Resp 12/05/22 1148 18     Temp 12/05/22 1148 98 F (36.7 C)     Temp src --      SpO2 12/05/22 1148 100 %     Weight 12/05/22 1146 140 lb (63.5 kg)     Height 12/05/22 1146 5\' 6"  (1.676 m)     Head Circumference --      Peak Flow --      Pain Score 12/05/22 1146 10     Pain Loc --      Pain Education --      Exclude from Growth Chart --     Most recent vital signs: Vitals:   12/05/22 1311 12/05/22 1548  BP: (!) 146/69   Pulse: 79   Resp: 15   Temp:  97.9 F (36.6 C)  SpO2: 100%     Physical Exam Constitutional:      Appearance: She is well-developed.  HENT:     Head: Atraumatic.  Eyes:     Conjunctiva/sclera: Conjunctivae normal.  Cardiovascular:     Rate and Rhythm: Regular rhythm.  Pulmonary:     Effort: No respiratory distress.  Abdominal:     General: There is no distension.  Musculoskeletal:        General: Normal range of motion.     Cervical back: Normal range of motion.     Right lower leg: No edema.     Left lower leg: No edema.   Skin:    General: Skin is warm.  Neurological:     Mental Status: She is alert. Mental status is at baseline.     IMPRESSION / MDM / ASSESSMENT AND PLAN / ED COURSE  I reviewed the triage vital signs and the nursing notes.  Differential diagnosis including musculoskeletal, GERD/PUD, gastritis, pneumonia, ACS  EKG  I, Corena Herter, the attending physician, personally viewed and interpreted this ECG.  Atrial fibrillation with normal rate.  No significant ST elevation or depression.  No signs of acute ischemia or dysrhythmia.  afib while on cardiac telemetry.  RADIOLOGY I independently reviewed imaging, my interpretation of imaging: Chest x-ray no signs of pneumonia  LABS (all labs ordered are listed, but only abnormal results are displayed) Labs interpreted as -    Labs Reviewed  BASIC METABOLIC PANEL - Abnormal; Notable for the following components:      Result Value  Glucose, Bld 285 (*)    All other components within normal limits  CBC - Abnormal; Notable for the following components:   Hemoglobin 11.3 (*)    All other components within normal limits  HEMOGLOBIN A1C  TROPONIN I (HIGH SENSITIVITY)  TROPONIN I (HIGH SENSITIVITY)     MDM  Patient was given aspirin and nitroglycerin.  Troponin is negative.  On reevaluation patient states that she had significant improvement of her chest pain following nitroglycerin.  Denies any nausea or vomiting.  High risk heart score given her history of coronary artery disease with prior stent placement more than 2 years ago.  No recent stress testing.  Consulted hospitalist for admission for ACS     PROCEDURES:  Critical Care performed: No  Procedures  Patient's presentation is most consistent with acute presentation with potential threat to life or bodily function.   MEDICATIONS ORDERED IN ED: Medications  nitroGLYCERIN (NITROSTAT) SL tablet 0.4 mg (0.4 mg Sublingual Given 12/05/22 1325)  ondansetron (ZOFRAN)  injection 4 mg (has no administration in time range)  apixaban (ELIQUIS) tablet 5 mg (has no administration in time range)  albuterol (VENTOLIN HFA) 108 (90 Base) MCG/ACT inhaler 2 puff (has no administration in time range)  ascorbic acid (VITAMIN C) tablet 500 mg (has no administration in time range)  aspirin EC tablet 81 mg (has no administration in time range)  atorvastatin (LIPITOR) tablet 80 mg (has no administration in time range)  insulin glargine-yfgn (SEMGLEE) injection 20 Units (has no administration in time range)  pantoprazole (PROTONIX) EC tablet 40 mg (has no administration in time range)  nitrofurantoin (macrocrystal-monohydrate) (MACROBID) capsule 100 mg (has no administration in time range)  acetaminophen (TYLENOL) tablet 650 mg (has no administration in time range)  insulin aspart (novoLOG) injection 0-9 Units (has no administration in time range)  insulin aspart (novoLOG) injection 3 Units (has no administration in time range)  aspirin chewable tablet 324 mg (324 mg Oral Given 12/05/22 1322)    FINAL CLINICAL IMPRESSION(S) / ED DIAGNOSES   Final diagnoses:  Chest pain, unspecified type     Rx / DC Orders   ED Discharge Orders     None        Note:  This document was prepared using Dragon voice recognition software and may include unintentional dictation errors.   Corena Herter, MD 12/05/22 1630

## 2022-12-05 NOTE — Assessment & Plan Note (Signed)
PPI ?

## 2022-12-05 NOTE — Assessment & Plan Note (Addendum)
Patient with recurrent chest pain over the past 2 days moderate to severe Minimal to mild improvement with nitroglycerin and aspirin Baseline CAD s/p PCI to LAD and Ramus 11/2020  Troponin negative x 2 EKG normal sinus rhythm Heart score 6+ Status post full dose aspirin Will plan for formal cardiology evaluation Anticipate stress test versus cardiac catheterization 2D echo Follow-up cardiology recommendations Reassess if CP persists

## 2022-12-05 NOTE — H&P (Addendum)
History and Physical    Patient: Beth Carroll ZDG:[id number] DOB: 11/26/1970 DOA: 12/05/2022 DOS: the patient was seen and examined on 12/05/2022 PCP: Gracelyn Nurse, MD  Patient coming from: Home  Chief Complaint:  Chief Complaint  Patient presents with   Chest Pain   HPI: Beth Carroll is a 52 y.o. female with medical history significant of peripheral artery disease, hyperglycemia with type 2 diabetes, GERD, HFpEF, coronary artery disease, hx/o diabetic foot infection presenting w/ unstable angina.  Patient was noted baseline history of coronary artery disease status post stenting 11/2020 presenting with unstable angina.  Patient reports recurrent chest pain over the past 1 to 2 days.  Symptoms have been recurrent for the past 3 weeks.  Symptoms seem to progressively worsening.  Positive left central chest pain radiation to the left shoulder.  Chest pain present at rest as well as with exertion.  Has had episodes of chest pain waking her up from sleep.  No associated nausea or diaphoresis.  Followed by outpatient [facility] cardiology.  No orthopnea or PND.  Has been compliant with home medication regimen including aspirin and Eliquis. Presented to the ER afebrile, hemodynamically stable.  Satting well on room air.  White count 4.2, hemoglobin 11.3, platelets 262, troponin negative x 2.  Glucose 285.  Creatinine 0.5.  EKG normal sinus rhythm. Review of Systems: As mentioned in the history of present illness. All other systems reviewed and are negative. Past Medical History:  Diagnosis Date   Acute deep vein thrombosis (DVT) of right lower extremity (HCC)    ADHD (attention deficit hyperactivity disorder)    AKI (acute kidney injury) (HCC) 04/30/2022   Aortic atherosclerosis (HCC)    COPD (chronic obstructive pulmonary disease) (HCC)    Coronary artery disease 12/15/2020   a.) LHC/PCI 12/15/2020: 99% mLAD (2.25 x 18 mm Onyx Frontier DES), 90% RI (2.75 x 22 mm Onyx Frontier DES), 40% m-dLAD    Diabetes mellitus without complication (HCC)    Diabetes mellitus, type 2 (HCC)    GERD (gastroesophageal reflux disease)    HFrEF (heart failure with reduced ejection fraction) (HCC)    a.) TTE 12/10/2020: EF 40-45%, apical/periapical HK, G2DD; b.) LHC 12/15/2020: EF 35-45%; c.) TTE 06/26/2021: EF 40-45%, LVH, G2DD; d.) TTE 03/21/2022: EF 55-60%, LV/apical sep/ant wall HK, Triv MR, AoV sclerosis   High anion gap metabolic acidosis 04/30/2022   Hyperlipemia    Hypertension    Hypophosphatemia 04/30/2022   Ischemic cardiomyopathy    a.) TTE 12/10/2020: EF 40-45%; b.) LHC 12/15/2020: EF 35-45%; c.) TTE 06/26/2021: EF 40-45%; d.) TTE 03/21/2022: EF 55-60%   Meningioma (HCC)    a.) CT head and MRI brain 08/21/2021: LEFT parietal meningioma   Myocardial infarction (HCC)    Nausea vomiting and diarrhea 04/30/2022   Orthostatic hypotension    PAD (peripheral artery disease) (HCC)    Sepsis secondary to UTI (HCC) 04/30/2022   Tobacco use    Past Surgical History:  Procedure Laterality Date   AMPUTATION Right 06/03/2022   Procedure: AMPUTATION RAY ( 1ST AND 2ND TOE);  Surgeon: Annice Needy, MD;  Location: [facility];  Service: Vascular;  Laterality: Right;   CHOLECYSTECTOMY     CORONARY STENT INTERVENTION N/A 12/15/2020   Procedure: CORONARY STENT INTERVENTION;  Surgeon: Iran Ouch, MD;  Location: [facility];  Service: Cardiovascular;  Laterality: N/A;   INCISION AND DRAINAGE OF WOUND Right 07/09/2022   Procedure: IRRIGATION AND DEBRIDEMENT WOUND;  Surgeon: Linus Galas, DPM;  Location:  [facility];  Service: Podiatry;  Laterality: Right;   LEFT HEART CATH AND CORONARY ANGIOGRAPHY N/A 12/15/2020   Procedure: LEFT HEART CATH AND CORONARY ANGIOGRAPHY;  Surgeon: Iran Ouch, MD;  Location: [facility];  Service: Cardiovascular;  Laterality: N/A;   LOWER EXTREMITY ANGIOGRAPHY Left 12/10/2020   Procedure: Lower Extremity Angiography;  Surgeon: Annice Needy, MD;  Location:  [facility];  Service: Cardiovascular;  Laterality: Left;   LOWER EXTREMITY ANGIOGRAPHY Right 12/25/2020   Procedure: LOWER EXTREMITY ANGIOGRAPHY;  Surgeon: Annice Needy, MD;  Location: [facility];  Service: Cardiovascular;  Laterality: Right;   LOWER EXTREMITY ANGIOGRAPHY Right 08/19/2021   Procedure: Lower Extremity Angiography;  Surgeon: Annice Needy, MD;  Location: [facility];  Service: Cardiovascular;  Laterality: Right;   LOWER EXTREMITY ANGIOGRAPHY Right 08/19/2021   Procedure: Lower Extremity Angiography;  Surgeon: Annice Needy, MD;  Location: [facility];  Service: Cardiovascular;  Laterality: Right;   LOWER EXTREMITY ANGIOGRAPHY Right 04/24/2022   Procedure: Lower Extremity Angiography;  Surgeon: Learta Codding, MD;  Location: [facility];  Service: Cardiovascular;  Laterality: Right;   ORIF FEMUR FRACTURE Right 12/14/2021   Procedure: OPEN REDUCTION INTERNAL FIXATION RIGHT DISTAL FEMUR;  Surgeon: Roby Lofts, MD;  Location: [facility];  Service: Orthopedics;  Laterality: Right;   Social History:  reports that she quit smoking about 1 years ago. Her smoking use included cigarettes. She has never been exposed to tobacco smoke. She has never used smokeless tobacco. She reports that she does not drink alcohol. No history on file for drug use.  No Known Allergies  Family History  Problem Relation Age of Onset   Hypertension Father     Prior to Admission medications   Medication Sig Start Date End Date Taking? Authorizing Provider  acetaminophen (TYLENOL) 650 MG CR tablet Take 650 mg by mouth every 8 (eight) hours as needed for pain.    [provider]  albuterol (VENTOLIN HFA) 108 (90 Base) MCG/ACT inhaler Inhale 2 puffs into the lungs every 6 (six) hours as needed for wheezing or shortness of breath.    [provider]  apixaban (ELIQUIS) 5 MG TABS tablet Take 1 tablet (5 mg total) by mouth 2 (two) times daily. 11/09/22    Georgiana Spinner, NP  ascorbic acid (VITAMIN C) 500 MG tablet Take 1 tablet (500 mg total) by mouth 2 (two) times daily. 07/14/22   Sunnie Nielsen, DO  aspirin EC 81 MG tablet Take 81 mg by mouth daily. Swallow whole.    [provider]  atorvastatin (LIPITOR) 80 MG tablet Take 1 tablet (80 mg total) by mouth daily. 04/27/22 07/26/22  Darlin Priestly, MD  B-D UF III MINI PEN NEEDLES 31G X 5 MM MISC See admin instructions. 07/09/22   [provider]  calcium carbonate (TUMS - DOSED IN MG ELEMENTAL CALCIUM) 500 MG chewable tablet Chew 1 tablet (200 mg of elemental calcium total) by mouth 3 (three) times daily before meals. 04/28/22   Esaw Grandchild A, DO  Continuous Glucose Sensor (FREESTYLE LIBRE 3 SENSOR) MISC USE 1 DEVICE EVERY 14 DAYS. 07/05/22   [provider]  estradiol (ESTRACE) 0.1 MG/GM vaginal cream Estrogen Cream Instruction Discard applicator Apply pea sized amount to tip of finger to urethra before bed. Wash hands well after application. Use daily for one week then Monday, Wednesday and Friday 10/06/22   Sondra Come, MD  insulin aspart (NOVOLOG)  100 UNIT/ML injection Inject 8 Units into the skin 3 (three) times daily before meals.    [provider]  insulin glargine (LANTUS SOLOSTAR) 100 UNIT/ML Solostar Pen Inject 35 Units into the skin at bedtime. 07/14/22   Sunnie Nielsen, DO  Multiple Vitamins-Minerals (CENTRUM SILVER 50+WOMEN PO) Take 1 tablet by mouth daily.    [provider]  nitrofurantoin, macrocrystal-monohydrate, (MACROBID) 100 MG capsule Take 1 capsule (100 mg total) by mouth daily. 10/06/22   Sondra Come, MD  pantoprazole (PROTONIX) 40 MG tablet Take 1 tablet (40 mg total) by mouth daily. 12/17/20   Elgergawy, Leana Roe, MD  traMADol (ULTRAM) 50 MG tablet Take 50 mg by mouth every 6 (six) hours as needed. 07/22/22   [provider]  zinc sulfate 220 (50 Zn) MG capsule Take 1 capsule (220 mg total) by mouth daily. 07/14/22    Sunnie Nielsen, DO    Physical Exam: Vitals:   12/05/22 1255 12/05/22 1257 12/05/22 1311 12/05/22 1548  BP: 103/69  (!) 146/69   Pulse: 79  79   Resp: 20  15   Temp:    97.9 F (36.6 C)  TempSrc:    Oral  SpO2: 99% 100% 100%   Weight:      Height:       Physical Exam Constitutional:      Appearance: She is normal weight.  HENT:     Head: Normocephalic.     Nose: Nose normal.     Mouth/Throat:     Mouth: Mucous membranes are moist.  Eyes:     Pupils: Pupils are equal, round, and reactive to light.  Cardiovascular:     Rate and Rhythm: Normal rate and regular rhythm.  Pulmonary:     Effort: Pulmonary effort is normal.  Abdominal:     General: Bowel sounds are normal.  Musculoskeletal:        General: Normal range of motion.  Skin:    General: Skin is warm.  Neurological:     General: No focal deficit present.  Psychiatric:        Mood and Affect: Mood normal.     Data Reviewed:  There are no new results to review at this time.  DG Chest 2 View CLINICAL DATA:  Chest pain for several weeks. Recent cardiac stent placement.  EXAM: CHEST - 2 VIEW  COMPARISON:  07/05/2022  FINDINGS: The heart size and mediastinal contours are within normal limits. Aortic atherosclerotic calcifications. Both lungs are clear. The visualized skeletal structures are unremarkable.Surgical clips noted in the right upper quadrant of the abdomen.  IMPRESSION: No active cardiopulmonary disease.  Electronically Signed   By: Signa Kell M.D.   On: 12/05/2022 12:23  Lab Results  Component Value Date   WBC 4.2 12/05/2022   HGB 11.3 (L) 12/05/2022   HCT 36.5 12/05/2022   MCV 88.6 12/05/2022   PLT 262 12/05/2022   Last metabolic panel Lab Results  Component Value Date   GLUCOSE 285 (H) 12/05/2022   NA 136 12/05/2022   K 4.0 12/05/2022   CL 101 12/05/2022   CO2 28 12/05/2022   BUN 15 12/05/2022   CREATININE 0.53 12/05/2022   GFRNONAA >60 12/05/2022   CALCIUM 9.0  12/05/2022   PHOS 3.0 04/25/2022   PROT 6.8 11/17/2022   ALBUMIN 3.4 (L) 11/17/2022   BILITOT 0.5 11/17/2022   ALKPHOS 80 11/17/2022   AST 17 11/17/2022   ALT 14 11/17/2022   ANIONGAP 7 12/05/2022  Assessment and Plan: [facility] Patient with recurrent chest pain over the past 2 days moderate to severe Minimal to mild improvement with nitroglycerin and aspirin Baseline CAD s/p PCI to LAD and Ramus 11/2020  Troponin negative x 2 EKG normal sinus rhythm Heart score 6+ Status post full dose aspirin Will plan for formal cardiology evaluation Anticipate stress test versus cardiac catheterization 2D echo Follow-up cardiology recommendations Reassess if CP persists   Hyperglycemia due to type 2 diabetes mellitus (HCC) Blood sugar into 80s Continue long-acting insulin SSI A1c Monitor  Atrial fibrillation (HCC) Rate controlled at present  Cont eliquis    COPD (chronic obstructive pulmonary disease) (HCC) Stable from a resp standpoint  Cont home inhalers    Dyslipidemia Statin   GERD without esophagitis PPI   PAD (peripheral artery disease) (HCC) Baseline peripheral artery disease Continue ASA, eliquis, statin  Ischemic cardiomyopathy 2D echo February 2024 with EF of 50 to 55% Appears euvolemic  Hypertension BP stable Titrate home regimen      Advance Care Planning:   Code Status: Full Code   Consults: Cardiology   Family Communication: Family at the bedside   Severity of Illness: The appropriate patient status for this patient is INPATIENT. Inpatient status is judged to be reasonable and necessary in order to provide the required intensity of service to ensure the patient's safety. The patient's presenting symptoms, physical exam findings, and initial radiographic and laboratory data in the context of their chronic comorbidities is felt to place them at high risk for further clinical deterioration. Furthermore, it is not anticipated that the  patient will be medically stable for discharge from the hospital within 2 midnights of admission.   * I certify that at the point of admission it is my clinical judgment that the patient will require inpatient hospital care spanning beyond 2 midnights from the point of admission due to high intensity of service, high risk for further deterioration and high frequency of surveillance required.*  Author: Floydene Flock, MD 12/05/2022 4:30 PM  For on call review [email or web address].

## 2022-12-05 NOTE — ED Triage Notes (Signed)
Pt comes via POV form home with c/o cp. Pt states left sided cp. Pt states this started few weeks ago and has since gotten worse. Pt states sob. Pt is on thinner. Pt has hx of stents.

## 2022-12-05 NOTE — Assessment & Plan Note (Signed)
Blood sugar into 80s Continue long-acting insulin SSI A1c Monitor

## 2022-12-05 NOTE — Assessment & Plan Note (Signed)
2D echo February 2024 with EF of 50 to 55% Appears euvolemic

## 2022-12-05 NOTE — Assessment & Plan Note (Signed)
Rate controlled at present  Cont eliquis

## 2022-12-05 NOTE — Assessment & Plan Note (Signed)
?   Statin.

## 2022-12-05 NOTE — Assessment & Plan Note (Signed)
BP stable Titrate home regimen 

## 2022-12-05 NOTE — Assessment & Plan Note (Addendum)
Baseline peripheral artery disease Continue ASA, eliquis, statin

## 2022-12-05 NOTE — ED Notes (Signed)
Meal tray given. Call light within reach, NAD

## 2022-12-06 ENCOUNTER — Ambulatory Visit: Payer: Medicaid Other | Admitting: Student

## 2022-12-06 ENCOUNTER — Inpatient Hospital Stay (HOSPITAL_COMMUNITY)
Admit: 2022-12-06 | Discharge: 2022-12-06 | Disposition: A | Discharge status: Home / Self Care | Payer: Medicaid Other | Attending: Family Medicine | Admitting: Family Medicine

## 2022-12-06 ENCOUNTER — Inpatient Hospital Stay: Payer: Medicaid Other

## 2022-12-06 DIAGNOSIS — R0789 Other chest pain: Principal | ICD-10-CM

## 2022-12-06 DIAGNOSIS — R079 Chest pain, unspecified: Secondary | ICD-10-CM

## 2022-12-06 DIAGNOSIS — I10 Essential (primary) hypertension: Secondary | ICD-10-CM

## 2022-12-06 DIAGNOSIS — I739 Peripheral vascular disease, unspecified: Secondary | ICD-10-CM | POA: Diagnosis not present

## 2022-12-06 LAB — NM MYOCAR MULTI W/SPECT W/WALL MOTION / EF
LV dias vol: 113 mL (ref 46–106)
LV sys vol: 55 mL
Nuc Stress EF: 51 %
Peak HR: 88 {beats}/min
Percent HR: 52 %
Rest HR: 75 {beats}/min
Rest Nuclear Isotope Dose: 10.3 mCi
SDS: 0
SRS: 27
SSS: 23
ST Depression (mm): 0 mm
Stress Nuclear Isotope Dose: 29.9 mCi
TID: 0.8

## 2022-12-06 LAB — ECHOCARDIOGRAM COMPLETE
AR max vel: 2.45 cm2
AV Area VTI: 2.74 cm2
AV Area mean vel: 2.76 cm2
AV Mean grad: 2 mm[Hg]
AV Peak grad: 5.8 mm[Hg]
Ao pk vel: 1.2 m/s
Area-P 1/2: 4.26 cm2
Calc EF: 48.7 %
Height: 66 in
MV VTI: 2.58 cm2
S' Lateral: 3.2 cm
Single Plane A2C EF: 44.7 %
Single Plane A4C EF: 52.8 %
Weight: 2240 [oz_av]

## 2022-12-06 LAB — CBG MONITORING, ED
Glucose-Capillary: 310 mg/dL — ABNORMAL HIGH (ref 70–99)
Glucose-Capillary: 325 mg/dL — ABNORMAL HIGH (ref 70–99)
Glucose-Capillary: 262 mg/dL — ABNORMAL HIGH (ref 70–99)

## 2022-12-06 LAB — LIPID PANEL
Cholesterol: 97 mg/dL (ref 0–200)
HDL: 39 mg/dL — ABNORMAL LOW (ref >40)
LDL Cholesterol: 50 mg/dL (ref 0–99)
Total CHOL/HDL Ratio: 2.5 {ratio}
Triglycerides: 41 mg/dL (ref <150)
VLDL: 8 mg/dL (ref 0–40)

## 2022-12-06 MED ORDER — REGADENOSON 0.4 MG/5ML IV SOLN
0.4000 mg | Freq: Once | INTRAVENOUS | Status: AC
  Administered 2022-12-06: 0.4 mg via INTRAVENOUS

## 2022-12-06 MED ORDER — TECHNETIUM TC 99M TETROFOSMIN IV KIT
10.0000 | PACK | Freq: Once | INTRAVENOUS | Status: AC | PRN
  Administered 2022-12-06: 10.28 via INTRAVENOUS

## 2022-12-06 MED ORDER — PERFLUTREN LIPID MICROSPHERE
1.0000 mL | INTRAVENOUS | Status: AC | PRN
  Administered 2022-12-06: 5 mL via INTRAVENOUS

## 2022-12-06 MED ORDER — LOSARTAN POTASSIUM 25 MG PO TABS
25.0000 mg | ORAL_TABLET | Freq: Every day | ORAL | Status: DC
  Administered 2022-12-06: 25 mg via ORAL
  Filled 2022-12-06: qty 1

## 2022-12-06 MED ORDER — LOPERAMIDE HCL 2 MG PO CAPS
4.0000 mg | ORAL_CAPSULE | Freq: Once | ORAL | Status: AC
  Administered 2022-12-06: 4 mg via ORAL
  Filled 2022-12-06: qty 2

## 2022-12-06 MED ORDER — TECHNETIUM TC 99M TETROFOSMIN IV KIT
30.0000 | PACK | Freq: Once | INTRAVENOUS | Status: AC | PRN
  Administered 2022-12-06: 29.93 via INTRAVENOUS

## 2022-12-06 NOTE — Consult Note (Signed)
Cardiology Consultation:   Patient ID: Beth Carroll; [id number]; 30-Apr-1970   Admit date: 12/05/2022 Date of Consult: 12/06/2022  Primary Care Provider: Gracelyn Nurse, MD Primary Cardiologist: Mariah Milling Primary Electrophysiologist:  None   Patient Profile:   Beth Carroll is a 52 y.o. 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 Alvester Morin.  History of Present Illness:   [name] was admitted to the hospital in 11/2020 with critical limb ischemia status post intervention and found to have a new cardiomyopathy by echo with an EF of 40 to 45%, with apical and periapical hypokinesis with basal and mid wall motion past preserved, grade 2 diastolic dysfunction, normal RV systolic function and ventricular cavity size, and no significant valvular abnormalities.  LHC during that admission showed severe two-vessel CAD with subtotal occlusion of the mid LAD as well as significant stenosis in the proximal ramus.  The LAD was diffusely diseased and small in size in the mid to distal segment.  She underwent successful PCI/DES to the mid LAD and ramus.  Repeat echo in 06/2021 showed a persistent cardiomyopathy with an EF of 40 to 45%, akinesis of the entire apical LV segment, normal RV systolic function and ventricular cavity size, and no significant valvular abnormalities.  She most recently underwent echo in 02/2022 that showed an EF of 55 to 60%, akinesis of the apical septal wall and anterior wall, normal RV systolic function and ventricular cavity size, normal RVSP, trivial mitral regurgitation, and aortic valve sclerosis without evidence of stenosis.  She reports developing right-sided chest discomfort approximately 6 months ago that resolved without intervention.   More recently, approximately 3 weeks ago she developed sharp, initially intermittent left-sided chest discomfort for the past several days has become more constant.  Pain is not exacerbated by anything.  She has not tried anything to alleviate pain.  Pain is associated with some shortness of breath.  She also notes some left elbow discomfort radiating into the left axilla.  She reports the left-sided chest pain is reproducible to palpation.  The left upper extremity pain was reproducible with BP cuff.  Pain does not feel similar to what she experienced leading up to her PCI in 2022.  Pain was relieved with SL NTG in the ED.  Initially, BP was soft in the low 100s, though more recently blood pressure has trended from the 140s to 170s systolic.  Heart rates in the 60s to 80s bpm.  Afebrile.  Oxygen saturation 100% on room air.  EKG showed sinus rhythm with significant baseline artifact and wandering without acute ST-T changes.  High-sensitivity troponin negative x 2.  Chest x-ray without acute cardiopulmonary process.  Echo pending.  Currently with mild chest discomfort.    Past Medical History:  Diagnosis Date   Acute deep vein thrombosis (DVT) of right lower extremity (HCC)    ADHD (attention deficit hyperactivity disorder)    AKI (acute kidney injury) (HCC) 04/30/2022   Aortic atherosclerosis (HCC)    COPD (chronic obstructive pulmonary disease) (HCC)    Coronary artery disease 12/15/2020   a.) LHC/PCI 12/15/2020: 99% mLAD (2.25 x 18 mm Onyx Frontier DES), 90% RI (2.75 x 22 mm Onyx Frontier DES), 40% m-dLAD   Diabetes mellitus without complication (HCC)  Diabetes mellitus, type 2 (HCC)    GERD (gastroesophageal reflux disease)    HFrEF (heart failure with reduced ejection fraction) (HCC)    a.) TTE 12/10/2020: EF 40-45%, apical/periapical HK, G2DD; b.) LHC 12/15/2020: EF 35-45%; c.) TTE 06/26/2021: EF 40-45%, LVH, G2DD; d.) TTE 03/21/2022: EF 55-60%, LV/apical sep/ant wall HK, Triv MR, AoV  sclerosis   High anion gap metabolic acidosis 04/30/2022   Hyperlipemia    Hypertension    Hypophosphatemia 04/30/2022   Ischemic cardiomyopathy    a.) TTE 12/10/2020: EF 40-45%; b.) LHC 12/15/2020: EF 35-45%; c.) TTE 06/26/2021: EF 40-45%; d.) TTE 03/21/2022: EF 55-60%   Meningioma (HCC)    a.) CT head and MRI brain 08/21/2021: LEFT parietal meningioma   Myocardial infarction (HCC)    Nausea vomiting and diarrhea 04/30/2022   Orthostatic hypotension    PAD (peripheral artery disease) (HCC)    Sepsis secondary to UTI (HCC) 04/30/2022   Tobacco use     Past Surgical History:  Procedure Laterality Date   AMPUTATION Right 06/03/2022   Procedure: AMPUTATION RAY ( 1ST AND 2ND TOE);  Surgeon: Annice Needy, MD;  Location: [facility];  Service: Vascular;  Laterality: Right;   CHOLECYSTECTOMY     CORONARY STENT INTERVENTION N/A 12/15/2020   Procedure: CORONARY STENT INTERVENTION;  Surgeon: Iran Ouch, MD;  Location: [facility];  Service: Cardiovascular;  Laterality: N/A;   INCISION AND DRAINAGE OF WOUND Right 07/09/2022   Procedure: IRRIGATION AND DEBRIDEMENT WOUND;  Surgeon: Linus Galas, DPM;  Location: [facility];  Service: Podiatry;  Laterality: Right;   LEFT HEART CATH AND CORONARY ANGIOGRAPHY N/A 12/15/2020   Procedure: LEFT HEART CATH AND CORONARY ANGIOGRAPHY;  Surgeon: Iran Ouch, MD;  Location: [facility];  Service: Cardiovascular;  Laterality: N/A;   LOWER EXTREMITY ANGIOGRAPHY Left 12/10/2020   Procedure: Lower Extremity Angiography;  Surgeon: Annice Needy, MD;  Location: [facility];  Service: Cardiovascular;  Laterality: Left;   LOWER EXTREMITY ANGIOGRAPHY Right 12/25/2020   Procedure: LOWER EXTREMITY ANGIOGRAPHY;  Surgeon: Annice Needy, MD;  Location: [facility];  Service: Cardiovascular;  Laterality: Right;   LOWER EXTREMITY ANGIOGRAPHY Right 08/19/2021   Procedure: Lower Extremity Angiography;  Surgeon: Annice Needy, MD;  Location:  [facility];  Service: Cardiovascular;  Laterality: Right;   LOWER EXTREMITY ANGIOGRAPHY Right 08/19/2021   Procedure: Lower Extremity Angiography;  Surgeon: Annice Needy, MD;  Location: [facility];  Service: Cardiovascular;  Laterality: Right;   LOWER EXTREMITY ANGIOGRAPHY Right 04/24/2022   Procedure: Lower Extremity Angiography;  Surgeon: Learta Codding, MD;  Location: [facility];  Service: Cardiovascular;  Laterality: Right;   ORIF FEMUR FRACTURE Right 12/14/2021   Procedure: OPEN REDUCTION INTERNAL FIXATION RIGHT DISTAL FEMUR;  Surgeon: Roby Lofts, MD;  Location: [facility];  Service: Orthopedics;  Laterality: Right;     Home Meds: Prior to Admission medications   Medication Sig Start Date End Date Taking? Authorizing Provider  albuterol (VENTOLIN HFA) 108 (90 Base) MCG/ACT inhaler Inhale 2 puffs into the lungs every 6 (six) hours as needed for wheezing or shortness of breath.   Yes [provider]  apixaban (ELIQUIS) 5 MG TABS tablet Take 1 tablet (5 mg total) by mouth 2 (two) times daily. 11/09/22  Yes Georgiana Spinner, NP  aspirin EC 81 MG tablet Take 81 mg by mouth daily. Swallow whole.   Yes [provider]  atorvastatin (LIPITOR) 80  MG tablet Take 1 tablet (80 mg total) by mouth daily. 04/27/22 12/05/22 Yes Darlin Priestly, MD  clopidogrel (PLAVIX) 75 MG tablet Take 75 mg by mouth daily. 11/24/22  Yes [provider]  insulin aspart (NOVOLOG) 100 UNIT/ML injection Inject 8 Units into the skin 3 (three) times daily before meals.   Yes [provider]  insulin glargine (LANTUS SOLOSTAR) 100 UNIT/ML Solostar Pen Inject 35 Units into the skin at bedtime. 07/14/22  Yes Sunnie Nielsen, DO  pantoprazole (PROTONIX) 40 MG tablet Take 1 tablet (40 mg total) by mouth daily. 12/17/20  Yes Elgergawy, Leana Roe, MD  traMADol (ULTRAM) 50 MG tablet Take 50 mg by mouth every 6 (six) hours as needed. 07/22/22  Yes [provider]  zinc  sulfate 220 (50 Zn) MG capsule Take 1 capsule (220 mg total) by mouth daily. 07/14/22  Yes Sunnie Nielsen, DO  acetaminophen (TYLENOL) 650 MG CR tablet Take 650 mg by mouth every 8 (eight) hours as needed for pain.    [provider]  ascorbic acid (VITAMIN C) 500 MG tablet Take 1 tablet (500 mg total) by mouth 2 (two) times daily. 07/14/22   Sunnie Nielsen, DO  B-D UF III MINI PEN NEEDLES 31G X 5 MM MISC See admin instructions. 07/09/22   [provider]  calcium carbonate (TUMS - DOSED IN MG ELEMENTAL CALCIUM) 500 MG chewable tablet Chew 1 tablet (200 mg of elemental calcium total) by mouth 3 (three) times daily before meals. 04/28/22   Esaw Grandchild A, DO  Continuous Glucose Sensor (FREESTYLE LIBRE 3 SENSOR) MISC USE 1 DEVICE EVERY 14 DAYS. 07/05/22   [provider]  estradiol (ESTRACE) 0.1 MG/GM vaginal cream Estrogen Cream Instruction Discard applicator Apply pea sized amount to tip of finger to urethra before bed. Wash hands well after application. Use daily for one week then Monday, Wednesday and Friday Patient not taking: Reported on 12/05/2022 10/06/22   Sondra Come, MD  Multiple Vitamins-Minerals (CENTRUM SILVER 50+WOMEN PO) Take 1 tablet by mouth daily.    [provider]  nitrofurantoin, macrocrystal-monohydrate, (MACROBID) 100 MG capsule Take 1 capsule (100 mg total) by mouth daily. Patient not taking: Reported on 12/05/2022 10/06/22   Sondra Come, MD    Inpatient Medications: Scheduled Meds:  apixaban  5 mg Oral BID   ascorbic acid  500 mg Oral BID   aspirin EC  81 mg Oral Daily   atorvastatin  80 mg Oral Daily   insulin aspart  0-9 Units Subcutaneous TID WC   insulin aspart  3 Units Subcutaneous TID WC   insulin glargine-yfgn  25 Units Subcutaneous Daily   pantoprazole  40 mg Oral Daily   Continuous Infusions:  PRN Meds: acetaminophen, albuterol, nitroGLYCERIN, ondansetron (ZOFRAN) IV  Allergies:  No Known  Allergies  Social History:   Social History   Socioeconomic History   Marital status: Divorced    Spouse name: Not on file   Number of children: Not on file   Years of education: Not on file   Highest education level: Not on file  Occupational History   Not on file  Tobacco Use   Smoking status: Former    Current packs/day: 0.00    Types: Cigarettes    Quit date: 12/09/2020    Years since quitting: 1.9    Passive exposure: Never   Smokeless tobacco: Never  Vaping Use   Vaping status: Never Used  Substance and Sexual Activity   Alcohol use: Never  Drug use: Not on file   Sexual activity: Not on file  Other Topics Concern   Not on file  Social History Narrative   Not on file   Social Determinants of Health   Financial Resource Strain: Low Risk  (07/15/2022)   Received from [facility], [facility]   Overall Financial Resource Strain (CARDIA)    Difficulty of Paying Living Expenses: Not hard at all  Food Insecurity: No Food Insecurity (07/15/2022)   Received from [facility], [facility]   Hunger Vital Sign    Worried About Running Out of Food in the Last Year: Never true    Ran Out of Food in the Last Year: Never true  Transportation Needs: No Transportation Needs (07/18/2022)   Received from [facility], [facility]   [facility] - Transportation    In the past 12 months, has lack of transportation kept you from medical appointments or from getting medications?: No    Lack of Transportation (Non-Medical): No  Physical Activity: Insufficiently Active (04/02/2022)   Received from [facility], [facility]   Exercise Vital Sign    Days of Exercise per Week: 7 days    Minutes of Exercise per Session: 20 min  Stress: No Stress Concern Present (04/02/2022)   Received from [facility], [facility]    [employer] of Occupational Health - Occupational Stress Questionnaire    Feeling of Stress : Not at all  Social Connections: Moderately Isolated (04/02/2022)   Received from [facility], [facility]   Social Connection and Isolation Panel [NHANES]    Frequency of Communication with Friends and Family: More than three times a week    Frequency of Social Gatherings with Friends and Family: Twice a week    Attends Religious Services: More than 4 times per year    Active Member of [employer] or Organizations: No    Attends Banker Meetings: Never    Marital Status: Divorced  Catering manager Violence: Not At Risk (07/05/2022)   Humiliation, Afraid, Rape, and Kick questionnaire    Fear of Current or Ex-Partner: No    Emotionally Abused: No    Physically Abused: No    Sexually Abused: No     Family History:   Family History  Problem Relation Age of Onset   Hypertension Father     ROS:  Review of Systems  Constitutional:  Positive for malaise/fatigue. Negative for chills, diaphoresis, fever and weight loss.  HENT:  Negative for congestion.   Eyes:  Negative for discharge and redness.  Respiratory:  Positive for shortness of breath. Negative for cough, sputum production and wheezing.   Cardiovascular:  Positive for chest pain. Negative for palpitations, orthopnea, claudication, leg swelling and PND.  Gastrointestinal:  Negative for abdominal pain, heartburn, nausea and vomiting.  Musculoskeletal:  Positive for joint pain. Negative for falls and myalgias.  Skin:  Negative for rash.  Neurological:  Negative for dizziness, tingling, tremors, sensory change, speech change, focal weakness, loss of consciousness and weakness.  Endo/Heme/Allergies:  Does not bruise/bleed easily.  Psychiatric/Behavioral:  Negative for substance abuse. The patient is not nervous/anxious.   All other systems reviewed and are negative.     Physical Exam/Data:    Vitals:   12/06/22 0522 12/06/22 0530 12/06/22 0600 12/06/22 0630  BP:  (!) 148/63 (!) 157/79 (!) 170/74  Pulse:  73 75 76  Resp:  (!) 21 18 16   Temp: 98.3 F (36.8 C)     TempSrc: Oral     SpO2:  95% 96% 96%  Weight:      Height:       No intake or output data in the 24 hours ending 12/06/22 0825 Filed Weights   12/05/22 1146  Weight: 63.5 kg   Body mass index is 22.6 kg/m.   Physical Exam: General: Well developed, well nourished, in no acute distress. Head: Normocephalic, atraumatic, sclera non-icteric, no xanthomas, nares without discharge.  Neck: Negative for carotid bruits. JVD not elevated. Lungs: Clear bilaterally to auscultation without wheezes, rales, or rhonchi. Breathing is unlabored. Heart: RRR with S1 S2. No murmurs, rubs, or gallops appreciated. Abdomen: Soft, non-tender, non-distended with normoactive bowel sounds. No hepatomegaly. No rebound/guarding. No obvious abdominal masses. Msk:  Strength and tone appear normal for age. Extremities: No clubbing or cyanosis. No edema.  Neuro: Alert and oriented X 3. No facial asymmetry. No focal deficit. Moves all extremities spontaneously. Psych:  Responds to questions appropriately with a normal affect.   EKG:  The EKG was personally reviewed and demonstrates: NSR, 81 bpm, rare PVC, baseline wandering and artifact, prior anteroseptal infarct, nonspecific ST-T changes Telemetry:  Telemetry was personally reviewed and demonstrates: SR  Weights: Filed Weights   12/05/22 1146  Weight: 63.5 kg    Relevant CV Studies:  2D echo 03/21/2022: 1. Left ventricular ejection fraction, by estimation, is 55 to 60%. The  left ventricle has normal function. The left ventricle demonstrates  regional wall motion abnormalities (see scoring diagram/findings for  description). Left ventricular diastolic  parameters were normal. There is akinesis of the left ventricular, apical  segment. There is akinesis of the left ventricular,  apical septal wall and  anterior wall.   2. Right ventricular systolic function is normal. The right ventricular  size is normal. There is normal pulmonary artery systolic pressure. The  estimated right ventricular systolic pressure is 30.7 mmHg.   3. The mitral valve is normal in structure. Trivial mitral valve  regurgitation. No evidence of mitral stenosis.   4. The aortic valve is normal in structure. Aortic valve regurgitation is  not visualized. Aortic valve sclerosis/calcification is present, without  any evidence of aortic stenosis. Aortic valve Vmax measures 1.41 m/s.   5. The inferior vena cava is normal in size with greater than 50%  respiratory variability, suggesting right atrial pressure of 3 mmHg.  __________  2D echo 06/26/2021: 1. Left ventricular ejection fraction, by estimation, is 40 to 45%. The  left ventricle has mild to moderately decreased function. The left  ventricle demonstrates regional wall motion abnormalities (see scoring  diagram/findings for description). There is   mild left ventricular hypertrophy. Left ventricular diastolic parameters  are consistent with Grade II diastolic dysfunction (pseudonormalization).  There is akinesis of the left ventricular, entire apical segment.   2. Right ventricular systolic function is normal. The right ventricular  size is normal.   3. The mitral valve is normal in structure. No evidence of mitral valve  regurgitation.   4. The aortic valve was not well visualized. Aortic valve regurgitation  is not visualized.   5. The inferior vena cava is normal in size with <50% respiratory  variability, suggesting right atrial pressure of 8 mmHg.  __________  LHC 12/15/2020:   Mid LAD lesion is 99% stenosed.   Ramus lesion is 90% stenosed.   Mid LAD to Dist LAD lesion  is 40% stenosed.   A drug-eluting stent was successfully placed using a STENT ONYX FRONTIER 2.25X18.   A drug-eluting stent was successfully placed using a STENT  ONYX FRONTIER 2.75X22.   Post intervention, there is a 0% residual stenosis.   Post intervention, there is a 0% residual stenosis.   The left ventricular systolic function is normal.   LV end diastolic pressure is normal.   The left ventricular ejection fraction is 35-45% by visual estimate.   1.  Severe two-vessel coronary artery disease with subtotal occlusion of the mid LAD as well as significant stenosis in proximal ramus.  The LAD is diffusely diseased and small in size in the mid to distal segment. 2.  Moderately reduced LV systolic function with akinesis of mid to distal anterior and apical myocardium.  Normal left ventricular end-diastolic pressure. 3.  Successful angioplasty and drug-eluting stent placement to the mid LAD as well as proximal ramus.   Recommendations: Dual antiplatelet therapy for at least 6 months. Aggressive treatment of risk factors. __________  2D echo 12/10/2020: 1. Left ventricular ejection fraction, by estimation, is 40 to 45%. The  left ventricle has mildly decreased function. The left ventricle  demonstrates regional wall motion abnormalities (Apical and periapical  hypokinesis, basal and mid wall motion best  preserved, consistent with possible stress cardiomyopathy though unable to  exclude ishcemia). Left ventricular diastolic parameters are consistent  with Grade II diastolic dysfunction (pseudonormalization).   2. Right ventricular systolic function is normal. The right ventricular  size is normal. Tricuspid regurgitation signal is inadequate for assessing  PA pressure.   3. The mitral valve is normal in structure. No evidence of mitral valve  regurgitation. No evidence of mitral stenosis.   4. The aortic valve is normal in structure. Aortic valve regurgitation is  not visualized. No aortic stenosis is present.   5. The inferior vena cava is dilated in size with <50% respiratory  variability, suggesting right atrial pressure of 15 mmHg.    Laboratory Data:  Chemistry Recent Labs  Lab 12/05/22 1154  NA 136  K 4.0  CL 101  CO2 28  GLUCOSE 285*  BUN 15  CREATININE 0.53  CALCIUM 9.0  GFRNONAA >60  ANIONGAP 7    No results for input(s): "PROT", "ALBUMIN", "AST", "ALT", "ALKPHOS", "BILITOT" in the last 168 hours. Hematology Recent Labs  Lab 12/05/22 1154  WBC 4.2  RBC 4.12  HGB 11.3*  HCT 36.5  MCV 88.6  MCH 27.4  MCHC 31.0  RDW 13.7  PLT 262   Cardiac EnzymesNo results for input(s): "TROPONINI" in the last 168 hours. No results for input(s): "TROPIPOC" in the last 168 hours.  BNPNo results for input(s): "BNP", "PROBNP" in the last 168 hours.  DDimer No results for input(s): "DDIMER" in the last 168 hours.  Radiology/Studies:  DG Chest 2 View  Result Date: 12/05/2022 IMPRESSION: No active cardiopulmonary disease. Electronically Signed   By: Signa Kell M.D.   On: 12/05/2022 12:23    Assessment and Plan:   1. CAD involving the native coronary arteries with precordial pain and negative high sensitivity troponin: -Ruled out -Change diet to n.p.o. with plans for Lexiscan MPI at 1430 today given patient ate a bowl of oatmeal this morning -Echo pending -It appears the patient has been maintained on triple therapy with apixaban, aspirin, and clopidogrel -Discharge summary from 06/2022 mentions possible A-fib, however upon chart review this is a sinus rhythm with underlying artifact, from a cardiac perspective there is  no indication for apixaban, appears that this is being utilized by vascular surgery -Would avoid triple therapy long-term with deferment to vascular surgery regarding ultimate medication therapy -For now, PTA clopidogrel  2. HFimpEF with ICM: -Euvolemic and well compensated -Echo pending -Not currently on any GDMT  3.  PAD: -PTA apixaban and clopidogrel per vascular surgery  4.  HTN: -Blood pressure suboptimally controlled in the 170s systolic -Add losartan 25 mg -Titrate as  indicated  5.  HLD: -LDL 50 this admission -PTA atorvastatin 80 mg   Informed Consent   Shared Decision Making/Informed Consent{  The risks [chest pain, shortness of breath, cardiac arrhythmias, dizziness, blood pressure fluctuations, myocardial infarction, stroke/transient ischemic attack, nausea, vomiting, allergic reaction, radiation exposure, metallic taste sensation and life-threatening complications (estimated to be 1 in 10,000)], benefits (risk stratification, diagnosing coronary artery disease, treatment guidance) and alternatives of a nuclear stress test were discussed in detail with Ms. Toro and she agrees to proceed.       For questions or updates, please contact [facility] Please consult [email or web address] for contact info under Cardiology/STEMI.   Signed, Eula Listen, PA-C [facility] Pager: [phone] 12/06/2022, 8:25 AM

## 2022-12-06 NOTE — Progress Notes (Signed)
*  PRELIMINARY RESULTS* Echocardiogram 2D Echocardiogram has been performed.  Carolyne Fiscal 12/06/2022, 12:32 PM

## 2022-12-06 NOTE — Discharge Summary (Signed)
Physician Discharge Summary   Patient: Beth Carroll MRN: [id number] DOB: 1970/06/30  Admit date:     12/05/2022  Discharge date: {dischdate:26783}  Discharge Physician: Tyrone Nine   PCP: Gracelyn Nurse, MD   Recommendations at discharge:  {Tip this will not be part of the note when signed- Example include specific recommendations for outpatient follow-up, pending tests to follow-up on. (Optional):26781}  ***  Discharge Diagnoses: Principal Problem:   Chest pain Active Problems:   Unstable angina (HCC)   Hyperglycemia due to type 2 diabetes mellitus (HCC)   Hypertension   Ischemic cardiomyopathy   PAD (peripheral artery disease) (HCC)   GERD without esophagitis   Dyslipidemia   COPD (chronic obstructive pulmonary disease) (HCC)   Atrial fibrillation (HCC)  Resolved Problems:   * No resolved hospital problems. *  Hospital Course: No notes on file  Assessment and Plan: Unstable angina [facility]) Patient with recurrent chest pain over the past 2 days moderate to severe Minimal to mild improvement with nitroglycerin and aspirin Baseline CAD s/p PCI to LAD and Ramus 11/2020  Troponin negative x 2 EKG normal sinus rhythm Heart score 6+ Status post full dose aspirin Will plan for formal cardiology evaluation Anticipate stress test versus cardiac catheterization 2D echo Follow-up cardiology recommendations Reassess if CP persists   Hyperglycemia due to type 2 diabetes mellitus (HCC) Blood sugar into 80s Continue long-acting insulin SSI A1c Monitor  Atrial fibrillation (HCC) Rate controlled at present  Cont eliquis    COPD (chronic obstructive pulmonary disease) (HCC) Stable from a resp standpoint  Cont home inhalers    Dyslipidemia Statin   GERD without esophagitis PPI   PAD (peripheral artery disease) (HCC) Baseline peripheral artery disease Continue ASA, eliquis, statin  Ischemic cardiomyopathy 2D echo February 2024 with EF of 50 to 55% Appears  euvolemic  Hypertension BP stable Titrate home regimen      {Tip this will not be part of the note when signed Body mass index is 22.6 kg/m. , ,  Active Pressure Injury/Wound(s)     Pressure Ulcer  Duration          Pressure Injury 04/22/22 Buttocks Right;Left;Medial Deep Tissue Pressure Injury - Purple or maroon localized area of discolored intact skin or blood-filled blister due to damage of underlying soft tissue from pressure and/or shear. Large, red, purple are 228 days   Pressure Injury 04/22/22 Heel Left Deep Tissue Pressure Injury - Purple or maroon localized area of discolored intact skin or blood-filled blister due to damage of underlying soft tissue from pressure and/or shear. 228 days   Pressure Injury 04/22/22 Heel Right Deep Tissue Pressure Injury - Purple or maroon localized area of discolored intact skin or blood-filled blister due to damage of underlying soft tissue from pressure and/or shear. 228 days           (Optional):26781}  {(NOTE) Pain control PDMP Statment (Optional):26782} Consultants: *** Procedures performed: ***  Disposition: {Plan; Disposition:26390} Diet recommendation:  {Diet_Plan:26776} DISCHARGE MEDICATION: Allergies as of 12/06/2022   No Known Allergies      Medication List     TAKE these medications    acetaminophen 650 MG CR tablet Commonly known as: TYLENOL Take 650 mg by mouth every 8 (eight) hours as needed for pain.   albuterol 108 (90 Base) MCG/ACT inhaler Commonly known as: VENTOLIN HFA Inhale 2 puffs into the lungs every 6 (six) hours as needed for wheezing or shortness of breath.   apixaban 5 MG Tabs tablet Commonly known  as: Eliquis Take 1 tablet (5 mg total) by mouth 2 (two) times daily.   ascorbic acid 500 MG tablet Commonly known as: VITAMIN C Take 1 tablet (500 mg total) by mouth 2 (two) times daily.   aspirin EC 81 MG tablet Take 81 mg by mouth daily. Swallow whole.   atorvastatin 80 MG tablet Commonly  known as: LIPITOR Take 1 tablet (80 mg total) by mouth daily.   B-D UF III MINI PEN NEEDLES 31G X 5 MM Misc Generic drug: Insulin Pen Needle See admin instructions.   calcium carbonate 500 MG chewable tablet Commonly known as: TUMS - dosed in mg elemental calcium Chew 1 tablet (200 mg of elemental calcium total) by mouth 3 (three) times daily before meals.   CENTRUM SILVER 50+WOMEN PO Take 1 tablet by mouth daily.   clopidogrel 75 MG tablet Commonly known as: PLAVIX Take 75 mg by mouth daily.   estradiol 0.1 MG/GM vaginal cream Commonly known as: ESTRACE Estrogen Cream Instruction Discard applicator Apply pea sized amount to tip of finger to urethra before bed. Wash hands well after application. Use daily for one week then Monday, Wednesday and Friday   FreeStyle Libre 3 Sensor Misc USE 1 DEVICE EVERY 14 DAYS.   insulin aspart 100 UNIT/ML injection Commonly known as: novoLOG Inject 8 Units into the skin 3 (three) times daily before meals.   Lantus SoloStar 100 UNIT/ML Solostar Pen Generic drug: insulin glargine Inject 35 Units into the skin at bedtime.   nitrofurantoin (macrocrystal-monohydrate) 100 MG capsule Commonly known as: MACROBID Take 1 capsule (100 mg total) by mouth daily.   pantoprazole 40 MG tablet Commonly known as: PROTONIX Take 1 tablet (40 mg total) by mouth daily.   traMADol 50 MG tablet Commonly known as: ULTRAM Take 50 mg by mouth every 6 (six) hours as needed.   zinc sulfate (50mg  elemental zinc) 220 (50 Zn) MG capsule Take 1 capsule (220 mg total) by mouth daily.        Follow-up Information     Gracelyn Nurse, MD Follow up.   Specialty: Internal Medicine Contact information: [address] [facility] [place] [address] [phone]                Discharge Exam: Ceasar Mons Weights   12/05/22 1146  Weight: 63.5 kg   ***  Condition at discharge: {DC Condition:26389}  The results of significant diagnostics from  this hospitalization (including imaging, microbiology, ancillary and laboratory) are listed below for reference.   Imaging Studies: NM Myocar Multi W/Spect W/Wall Motion / EF  Result Date: 12/06/2022   No ST deviation was noted.   LV perfusion is abnormal. There is no evidence of ischemia. There is evidence of infarction. Defect 1: There is a large defect with absent uptake present in the apical to mid anterior, anteroseptal and apex location(s) that is fixed. There is abnormal wall motion in the defect area. Consistent with infarction.   Left ventricular function is abnormal. Global function is mildly reduced. Nuclear stress EF: 51%. End diastolic cavity size is normal. End systolic cavity size is normal.   There is evidence of transmural infarct in the mid to distal LAD distribution with no significant ischemia.   ECHOCARDIOGRAM COMPLETE  Result Date: 12/06/2022    ECHOCARDIOGRAM REPORT   Patient Name:   Beth Carroll Date of Exam: 12/06/2022 Medical Rec #:  [id number]     Height:       66.0 in Accession #:  [id number]    Weight:       140.0 lb Date of Birth:  November 09, 1970     BSA:          1.719 m Patient Age:    52 years      BP:           173/81 mmHg Patient Gender: F             HR:           77 bpm. Exam Location:  [facility] Procedure: 2D Echo, Cardiac Doppler, Color Doppler and Intracardiac            Opacification Agent Indications:     Chest Pain  History:         Patient has prior history of Echocardiogram examinations, most                  recent 03/21/2022. CHF and Cardiomyopathy, CAD and Angina, PAD                  and COPD, Arrythmias:Atrial Fibrillation, Signs/Symptoms:Chest                  Pain and Dizziness/Lightheadedness; Risk Factors:Hypertension,                  Diabetes, Dyslipidemia and Former Smoker.  Sonographer:     Mikki Harbor Referring Phys:  [phone] Francoise Schaumann [place] Diagnosing Phys: Lorine Bears MD IMPRESSIONS  1. Left ventricular ejection fraction, by estimation, is 50 to  55%. The left ventricle has low normal function. The left ventricle demonstrates regional wall motion abnormalities (see scoring diagram/findings for description). Left ventricular diastolic  parameters were normal. There is akinesis of the left ventricular, mid-apical anteroseptal wall, anterior segment and apical segment.  2. Right ventricular systolic function is normal. The right ventricular size is normal.  3. The mitral valve is normal in structure. Trivial mitral valve regurgitation. No evidence of mitral stenosis.  4. The aortic valve is normal in structure. Aortic valve regurgitation is not visualized. No aortic stenosis is present.  5. The inferior vena cava is normal in size with greater than 50% respiratory variability, suggesting right atrial pressure of 3 mmHg. FINDINGS  Left Ventricle: Left ventricular ejection fraction, by estimation, is 50 to 55%. The left ventricle has low normal function. The left ventricle demonstrates regional wall motion abnormalities. Definity contrast agent was given IV to delineate the left ventricular endocardial borders. The left ventricular internal cavity size was normal in size. There is borderline left ventricular hypertrophy. Left ventricular diastolic parameters were normal. Right Ventricle: The right ventricular size is normal. No increase in right ventricular wall thickness. Right ventricular systolic function is normal. Left Atrium: Left atrial size was normal in size. Right Atrium: Right atrial size was normal in size. Pericardium: There is no evidence of pericardial effusion. Mitral Valve: The mitral valve is normal in structure. Trivial mitral valve regurgitation. No evidence of mitral valve stenosis. MV peak gradient, 3.8 mmHg. The mean mitral valve gradient is 2.0 mmHg. Tricuspid Valve: The tricuspid valve is normal in structure. Tricuspid valve regurgitation is not demonstrated. No evidence of tricuspid stenosis. Aortic Valve: The aortic valve is normal in  structure. Aortic valve regurgitation is not visualized. No aortic stenosis is present. Aortic valve mean gradient measures 2.0 mmHg. Aortic valve peak gradient measures 5.8 mmHg. Aortic valve area, by VTI measures 2.74 cm. Pulmonic Valve: The pulmonic valve was normal in structure. Pulmonic valve regurgitation is  not visualized. No evidence of pulmonic stenosis. Aorta: The aortic root is normal in size and structure. Venous: The inferior vena cava is normal in size with greater than 50% respiratory variability, suggesting right atrial pressure of 3 mmHg. IAS/Shunts: No atrial level shunt detected by color flow Doppler.  LEFT VENTRICLE PLAX 2D LVIDd:         5.30 cm     Diastology LVIDs:         3.20 cm     LV e' medial:    6.31 cm/s LV PW:         0.90 cm     LV E/e' medial:  13.5 LV IVS:        1.10 cm     LV e' lateral:   6.20 cm/s LVOT diam:     2.00 cm     LV E/e' lateral: 13.7 LV SV:         66 LV SV Index:   39 LVOT Area:     3.14 cm  LV Volumes (MOD) LV vol d, MOD A2C: 79.5 ml LV vol d, MOD A4C: 93.8 ml LV vol s, MOD A2C: 44.0 ml LV vol s, MOD A4C: 44.3 ml LV SV MOD A2C:     35.5 ml LV SV MOD A4C:     93.8 ml LV SV MOD BP:      43.7 ml RIGHT VENTRICLE RV Basal diam:  3.40 cm RV Mid diam:    3.20 cm RV S prime:     13.70 cm/s LEFT ATRIUM             Index        RIGHT ATRIUM           Index LA diam:        4.10 cm 2.39 cm/m   RA Area:     19.20 cm LA Vol (A2C):   58.2 ml 33.87 ml/m  RA Volume:   53.80 ml  31.31 ml/m LA Vol (A4C):   58.8 ml 34.22 ml/m LA Biplane Vol: 59.1 ml 34.39 ml/m  AORTIC VALVE                    PULMONIC VALVE AV Area (Vmax):    2.45 cm     PV Vmax:       0.93 m/s AV Area (Vmean):   2.76 cm     PV Peak grad:  3.4 mmHg AV Area (VTI):     2.74 cm AV Vmax:           120.00 cm/s AV Vmean:          68.200 cm/s AV VTI:            0.242 m AV Peak Grad:      5.8 mmHg AV Mean Grad:      2.0 mmHg LVOT Vmax:         93.40 cm/s LVOT Vmean:        60.000 cm/s LVOT VTI:          0.211 m  LVOT/AV VTI ratio: 0.87  AORTA Ao Root diam: 3.10 cm MITRAL VALVE MV Area (PHT): 4.26 cm    SHUNTS MV Area VTI:   2.58 cm    Systemic VTI:  0.21 m MV Peak grad:  3.8 mmHg    Systemic Diam: 2.00 cm MV Mean grad:  2.0 mmHg MV Vmax:       0.98 m/s MV Vmean:  67.1 cm/s MV Decel Time: 178 msec MV E velocity: 84.90 cm/s MV A velocity: 88.60 cm/s MV E/A ratio:  0.96 Lorine Bears MD Electronically signed by Lorine Bears MD Signature Date/Time: 12/06/2022/1:35:59 PM    Final    DG Chest 2 View  Result Date: 12/05/2022 CLINICAL DATA:  Chest pain for several weeks. Recent cardiac stent placement. EXAM: CHEST - 2 VIEW COMPARISON:  07/05/2022 FINDINGS: The heart size and mediastinal contours are within normal limits. Aortic atherosclerotic calcifications. Both lungs are clear. The visualized skeletal structures are unremarkable.Surgical clips noted in the right upper quadrant of the abdomen. IMPRESSION: No active cardiopulmonary disease. Electronically Signed   By: Signa Kell M.D.   On: 12/05/2022 12:23    Microbiology: Results for orders placed or performed during the hospital encounter of 10/06/22  Urine Culture     Status: Abnormal   Collection Time: 10/06/22  9:00 AM   Specimen: Urine, Clean Catch  Result Value Ref Range Status   Specimen Description   Final    URINE, CLEAN CATCH Performed at [facility], [address] [place], [place] [address]    Special Requests   Final    NONE Performed at [facility], [address] [place], [place] [address]    Culture MULTIPLE SPECIES PRESENT, SUGGEST RECOLLECTION (A)  Final   Report Status 10/07/2022 FINAL  Final    Labs: CBC: Recent Labs  Lab 12/05/22 1154  WBC 4.2  HGB 11.3*  HCT 36.5  MCV 88.6  PLT 262   Basic Metabolic Panel: Recent Labs  Lab 12/05/22 1154  NA 136  K 4.0  CL 101  CO2 28  GLUCOSE 285*  BUN 15  CREATININE 0.53  CALCIUM 9.0   Liver Function Tests: No results for input(s):  "AST", "ALT", "ALKPHOS", "BILITOT", "PROT", "ALBUMIN" in the last 168 hours. CBG: Recent Labs  Lab 12/05/22 1916 12/05/22 2215 12/06/22 0749 12/06/22 0807 12/06/22 1141  GLUCAP 346* 398* 310* 325* 262*    Discharge time spent: {LESS THAN/GREATER THAN:26388} 30 minutes.  Signed: Tyrone Nine, MD [facility] 12/06/2022

## 2022-12-06 NOTE — ED Notes (Signed)
Pt otf at this time

## 2022-12-06 NOTE — Progress Notes (Signed)
[facility] PROGRESS NOTE  Beth Carroll (DOB: May 29, 1970) EXB:[id number] PCP: Gracelyn Nurse, MD  Brief Narrative: Beth Carroll is a 52 y.o. 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 Cardiac enzymes negative x2, ECG without acute ST-T changes, CXR nonacute. She was admitted with cardiology consultation pending. She is currently NPO on 11/11 awaiting stress testing.   Subjective: Left chest pain is improved, worse when I press on it but also present otherwise.   Objective: BP (!) 182/77   Pulse 79   Temp 98.6 F (37 C) (Oral)   Resp 13   Ht 5\' 6"  (1.676 m)   Wt 63.5 kg   LMP 01/26/2015   SpO2 98%   BMI 22.60 kg/m   Gen: No distress Pulm: Clear, nonlabored  CV: RRR, no MRG, trace edema GI: Soft, NT, ND, +BS  Neuro: Alert and oriented. No new focal deficits. Ext: Warm, dry, cap refill intact. Right 1st, 2nd toes surgically absent with healing eschar, no discharge or erythema.  Skin: No other/acute rashes, lesions or ulcers on visualized skin   Assessment & Plan: Atypical left chest pain with high pretest probability given CAD, PAD, HLD: There is likely a component of MSK pain to her presentation. There is no ACS currently. - Stress test is pending to risk stratify.  - Echo also pending.  - Appreciate cardiology consult.  - Continue DOAC, antiplatelet, statin (LDL is at goal at 50).  T2DM: HbA1c 8.6%.  - Ideally need better glycemic control chronically.  - Basal-bolus insulin ordered. Glucose into 300's currently, it appears basal insulin was held. Will need to give this. Holding mealtime insulin while NPO however.   ICM, HFimpEF, HTN: Euvolemic. Last echo showed improved LVEF to 50-55%. - Cardiology started losartan. I see some outpatient notes suggesting  hypOtension, though she is currently hypertensive.   PAD: No current rest pain to suggest critical limb ischemia.  - Defer antiplatelet/anticoagulation discussion to cardiology and vascular surgery.   Possible history of atrial fibrillation: This has been called into question.  - Wondering if cardiology could arrange home cardiac monitoring to help settle the issue.  - Currently in NSR, remaining on telemetry. - On eliquis.   COPD, tobacco use: Quiescent.  - Continue controller medications, prn albuterol, tobacco cessation counseling/encouragement.   GERD: Current symptoms do not strongly suggest this is contributing.  - Continue PPI.   Tyrone Nine, MD [facility] [email or web address] 12/06/2022, 1:32 PM

## 2022-12-07 ENCOUNTER — Ambulatory Visit (INDEPENDENT_AMBULATORY_CARE_PROVIDER_SITE_OTHER): Payer: Medicaid Other | Admitting: Vascular Surgery

## 2022-12-07 ENCOUNTER — Encounter (INDEPENDENT_AMBULATORY_CARE_PROVIDER_SITE_OTHER): Payer: Self-pay

## 2022-12-20 NOTE — Progress Notes (Deleted)
12/21/2022 10:36 AM   Beth Carroll 1970/03/14 [id number]  Referring provider: Gracelyn Nurse, MD [address] [facility] [place],  [place] [address]  Urological history: 1. rUTI's -Contributing factors of age, GSM, poorly controlled diabetes and constipation -non contrast CT (03/2022) -no kidney abnormalities, stones or other abnormal urological findings -Documented urine cultures over the last year  October 06, 2022 multiple species  Jun 01, 2022 no growth  April 25, 2022 less than 10,000 colonies  March 19, 2022 Streptococcus Agalactiae, E. coli and Pseudomonas aeruginosa -Vaginal estrogen cream 3 nights weekly, cranberry tablets and Macrobid 100 mg daily  No chief complaint on file.  HPI: Beth Carroll is a 52 y.o. female who presents today for dark urine.    Previous records reviewed.   She was initially seen by Dr. Richardo Hanks in September of this year for recurrent UTIs.  At that time she was given a 5-day course of Cipro 500 mg twice daily for an acute UTI and then placed on a prophylactic nitrofurantoin for 90 days in addition to topical estrogen cream and cranberry tablets.    She recently was hospitalized for chest pain.  CATH UA ***  PMH: Past Medical History:  Diagnosis Date   Acute deep vein thrombosis (DVT) of right lower extremity (HCC)    ADHD (attention deficit hyperactivity disorder)    AKI (acute kidney injury) (HCC) 04/30/2022   Aortic atherosclerosis (HCC)    COPD (chronic obstructive pulmonary disease) (HCC)    Coronary artery disease 12/15/2020   a.) LHC/PCI 12/15/2020: 99% mLAD (2.25 x 18 mm Onyx Frontier DES), 90% RI (2.75 x 22 mm Onyx Frontier DES), 40% m-dLAD   Diabetes mellitus without complication (HCC)    Diabetes mellitus, type 2 (HCC)    GERD (gastroesophageal reflux disease)    HFrEF (heart failure with reduced ejection fraction) (HCC)    a.) TTE 12/10/2020: EF 40-45%, apical/periapical HK, G2DD; b.) LHC 12/15/2020: EF  35-45%; c.) TTE 06/26/2021: EF 40-45%, LVH, G2DD; d.) TTE 03/21/2022: EF 55-60%, LV/apical sep/ant wall HK, Triv MR, AoV sclerosis   High anion gap metabolic acidosis 04/30/2022   Hyperlipemia    Hypertension    Hypophosphatemia 04/30/2022   Ischemic cardiomyopathy    a.) TTE 12/10/2020: EF 40-45%; b.) LHC 12/15/2020: EF 35-45%; c.) TTE 06/26/2021: EF 40-45%; d.) TTE 03/21/2022: EF 55-60%   Meningioma (HCC)    a.) CT head and MRI brain 08/21/2021: LEFT parietal meningioma   Myocardial infarction (HCC)    Nausea vomiting and diarrhea 04/30/2022   Orthostatic hypotension    PAD (peripheral artery disease) (HCC)    Sepsis secondary to UTI (HCC) 04/30/2022   Tobacco use     Surgical History: Past Surgical History:  Procedure Laterality Date   AMPUTATION Right 06/03/2022   Procedure: AMPUTATION RAY ( 1ST AND 2ND TOE);  Surgeon: Annice Needy, MD;  Location: [facility] ORS;  Service: Vascular;  Laterality: Right;   CHOLECYSTECTOMY     CORONARY STENT INTERVENTION N/A 12/15/2020   Procedure: CORONARY STENT INTERVENTION;  Surgeon: Iran Ouch, MD;  Location: [facility];  Service: Cardiovascular;  Laterality: N/A;   INCISION AND DRAINAGE OF WOUND Right 07/09/2022   Procedure: IRRIGATION AND DEBRIDEMENT WOUND;  Surgeon: Linus Galas, DPM;  Location: [facility];  Service: Podiatry;  Laterality: Right;   LEFT HEART CATH AND CORONARY ANGIOGRAPHY N/A 12/15/2020   Procedure: LEFT HEART CATH AND CORONARY ANGIOGRAPHY;  Surgeon: Iran Ouch, MD;  Location: [facility]  [facility];  Service: Cardiovascular;  Laterality: N/A;   LOWER EXTREMITY ANGIOGRAPHY Left 12/10/2020   Procedure: Lower Extremity Angiography;  Surgeon: Annice Needy, MD;  Location: [facility];  Service: Cardiovascular;  Laterality: Left;   LOWER EXTREMITY ANGIOGRAPHY Right 12/25/2020   Procedure: LOWER EXTREMITY ANGIOGRAPHY;  Surgeon: Annice Needy, MD;  Location: [facility];  Service: Cardiovascular;   Laterality: Right;   LOWER EXTREMITY ANGIOGRAPHY Right 08/19/2021   Procedure: Lower Extremity Angiography;  Surgeon: Annice Needy, MD;  Location: [facility];  Service: Cardiovascular;  Laterality: Right;   LOWER EXTREMITY ANGIOGRAPHY Right 08/19/2021   Procedure: Lower Extremity Angiography;  Surgeon: Annice Needy, MD;  Location: [facility];  Service: Cardiovascular;  Laterality: Right;   LOWER EXTREMITY ANGIOGRAPHY Right 04/24/2022   Procedure: Lower Extremity Angiography;  Surgeon: Learta Codding, MD;  Location: [facility];  Service: Cardiovascular;  Laterality: Right;   ORIF FEMUR FRACTURE Right 12/14/2021   Procedure: OPEN REDUCTION INTERNAL FIXATION RIGHT DISTAL FEMUR;  Surgeon: Roby Lofts, MD;  [facility];  Service: Orthopedics;  Laterality: Right;    Home Medications:  Allergies as of 12/21/2022   No Known Allergies      Medication List        Accurate as of December 20, 2022 10:36 AM. If you have any questions, ask your nurse or doctor.          acetaminophen 650 MG CR tablet Commonly known as: TYLENOL Take 650 mg by mouth every 8 (eight) hours as needed for pain.   albuterol 108 (90 Base) MCG/ACT inhaler Commonly known as: VENTOLIN HFA Inhale 2 puffs into the lungs every 6 (six) hours as needed for wheezing or shortness of breath.   apixaban 5 MG Tabs tablet Commonly known as: Eliquis Take 1 tablet (5 mg total) by mouth 2 (two) times daily.   ascorbic acid 500 MG tablet Commonly known as: VITAMIN C Take 1 tablet (500 mg total) by mouth 2 (two) times daily.   aspirin EC 81 MG tablet Take 81 mg by mouth daily. Swallow whole.   atorvastatin 80 MG tablet Commonly known as: LIPITOR Take 1 tablet (80 mg total) by mouth daily.   B-D UF III MINI PEN NEEDLES 31G X 5 MM Misc Generic drug: Insulin Pen Needle See admin instructions.   calcium carbonate 500 MG chewable tablet Commonly known as: TUMS - dosed in mg elemental  calcium Chew 1 tablet (200 mg of elemental calcium total) by mouth 3 (three) times daily before meals.   CENTRUM SILVER 50+WOMEN PO Take 1 tablet by mouth daily.   clopidogrel 75 MG tablet Commonly known as: PLAVIX Take 75 mg by mouth daily.   estradiol 0.1 MG/GM vaginal cream Commonly known as: ESTRACE Estrogen Cream Instruction Discard applicator Apply pea sized amount to tip of finger to urethra before bed. Wash hands well after application. Use daily for one week then Monday, Wednesday and Friday   FreeStyle Libre 3 Sensor Misc USE 1 DEVICE EVERY 14 DAYS.   insulin aspart 100 UNIT/ML injection Commonly known as: novoLOG Inject 8 Units into the skin 3 (three) times daily before meals.   Lantus SoloStar 100 UNIT/ML Solostar Pen Generic drug: insulin glargine Inject 35 Units into the skin at bedtime.   nitrofurantoin (macrocrystal-monohydrate) 100 MG capsule Commonly known as: MACROBID Take 1 capsule (100 mg total) by mouth daily.   pantoprazole 40 MG tablet Commonly known as: PROTONIX Take  1 tablet (40 mg total) by mouth daily.   traMADol 50 MG tablet Commonly known as: ULTRAM Take 50 mg by mouth every 6 (six) hours as needed.   zinc sulfate (50mg  elemental zinc) 220 (50 Zn) MG capsule Take 1 capsule (220 mg total) by mouth daily.        Allergies: No Known Allergies  Family History: Family History  Problem Relation Age of Onset   Hypertension Father     Social History:  reports that she quit smoking about 2 years ago. Her smoking use included cigarettes. She has never been exposed to tobacco smoke. She has never used smokeless tobacco. She reports that she does not drink alcohol. No history on file for drug use.  ROS: Pertinent ROS in HPI  Physical Exam: LMP 01/26/2015   Constitutional:  Well nourished. Alert and oriented, No acute distress. HEENT: [place] AT, moist mucus membranes.  Trachea midline, no masses. Cardiovascular: No clubbing, cyanosis, or  edema. Respiratory: Normal respiratory effort, no increased work of breathing. GU: No CVA tenderness.  No bladder fullness or masses.  Recession of labia minora, dry, pale vulvar vaginal mucosa and loss of mucosal ridges and folds.  Normal urethral meatus, no lesions, no prolapse, no discharge.   No urethral masses, tenderness and/or tenderness. No bladder fullness, tenderness or masses. *** vagina mucosa, *** estrogen effect, no discharge, no lesions, *** pelvic support, *** cystocele and *** rectocele noted.  No cervical motion tenderness.  Uterus is freely mobile and non-fixed.  No adnexal/parametria masses or tenderness noted.  Anus and perineum are without rashes or lesions.   ***  Neurologic: Grossly intact, no focal deficits, moving all 4 extremities. Psychiatric: Normal mood and affect.    Laboratory Data: Lab Results  Component Value Date   WBC 4.2 12/05/2022   HGB 11.3 (L) 12/05/2022   HCT 36.5 12/05/2022   MCV 88.6 12/05/2022   PLT 262 12/05/2022   Lab Results  Component Value Date   CREATININE 0.53 12/05/2022   Lab Results  Component Value Date   HGBA1C 8.6 (H) 12/05/2022      Component Value Date/Time   CHOL 97 12/06/2022 0450   HDL 39 (L) 12/06/2022 0450   CHOLHDL 2.5 12/06/2022 0450   VLDL 8 12/06/2022 0450   LDLCALC 50 12/06/2022 0450   Lab Results  Component Value Date   AST 17 11/17/2022   Lab Results  Component Value Date   ALT 14 11/17/2022    Urinalysis See EPIC and HPI I have reviewed the labs.   Pertinent Imaging: N/A  In and Out Catheterization  Patient is present today for a I & O catheterization due to ***. Patient was cleaned and prepped in a sterile fashion with betadine . A ***FR cath was inserted {dnt complications:20057} , ***ml of urine return was noted, urine was *** in color. A clean urine sample was collected for ***. Bladder was drained  And catheter was removed with out difficulty.    Performed by: ***  Assessment & Plan:   ***  1. rUTI's -Continue applying the vaginal estrogen cream 3 nights weekly and taking cranberry tablets daily     No follow-ups on file.  These notes generated with voice recognition software. I apologize for typographical errors.  Cloretta Ned  [facility] [address]  [address] [place], [place] [address] [phone]

## 2022-12-21 ENCOUNTER — Ambulatory Visit: Payer: Medicaid Other | Admitting: Urology

## 2022-12-22 ENCOUNTER — Encounter: Payer: Self-pay | Admitting: Urology

## 2022-12-22 ENCOUNTER — Ambulatory Visit: Payer: Medicaid Other | Admitting: Urology

## 2022-12-22 VITALS — BP 130/81 | HR 80

## 2022-12-22 DIAGNOSIS — R310 Gross hematuria: Secondary | ICD-10-CM

## 2022-12-22 DIAGNOSIS — N390 Urinary tract infection, site not specified: Secondary | ICD-10-CM

## 2022-12-22 DIAGNOSIS — Z87440 Personal history of urinary (tract) infections: Secondary | ICD-10-CM | POA: Diagnosis not present

## 2022-12-22 LAB — URINALYSIS, COMPLETE
Bilirubin, UA: NEGATIVE
Ketones, UA: NEGATIVE
Leukocytes,UA: NEGATIVE
Nitrite, UA: NEGATIVE
Specific Gravity, UA: 1.02 (ref 1.005–1.030)
Urobilinogen, Ur: 0.2 mg/dL (ref 0.2–1.0)
pH, UA: 7 (ref 5.0–7.5)

## 2022-12-22 LAB — MICROSCOPIC EXAMINATION: RBC, Urine: >30 /[HPF] — AB (ref 0–2)

## 2022-12-22 NOTE — Progress Notes (Addendum)
12/22/2022 8:38 AM   [name] 06/08/1970 [id number]  Referring provider: Gracelyn Nurse, MD [address] [facility] [place],  [place] [address]  Urological history: 1. rUTI's -Contributing factors of age, GSM, poorly controlled diabetes and constipation -non contrast CT (03/2022) -no kidney abnormalities, stones or other abnormal urological findings -Documented urine cultures over the last year  October 06, 2022 multiple species  Jun 01, 2022 no growth  April 25, 2022 less than 10,000 colonies  March 19, 2022 Streptococcus Agalactiae, E. coli and Pseudomonas aeruginosa -Vaginal estrogen cream 3 nights weekly, cranberry tablets and Macrobid 100 mg daily  Chief Complaint  Patient presents with   Follow-up   HPI: Beth Carroll is a 52 y.o. female who presents today for dark urine with her daughter, Beth Carroll.   Previous records reviewed.   She was initially seen by Dr. Richardo Hanks in September of this year for recurrent UTIs.  At that time she was given a 5-day course of Cipro 500 mg twice daily for an acute UTI and then placed on a prophylactic nitrofurantoin for 90 days in addition to topical estrogen cream and cranberry tablets.    She recently was hospitalized for chest pain.  She has been having rust colored urine off and on for the last several weeks in spite of the use of antibiotics.  She has been using vaginal estrogen cream, but not consistently.  Patient denies any modifying or aggravating factors.  Patient denies any recent UTI's dysuria or suprapubic/flank pain.  Patient denies any fevers, chills, nausea or vomiting.    CATH UA greater than 30 RBCs and a few bacteria and 3+ glucose  PVR 100 mL   PMH: Past Medical History:  Diagnosis Date   Acute deep vein thrombosis (DVT) of right lower extremity (HCC)    ADHD (attention deficit hyperactivity disorder)    AKI (acute kidney injury) (HCC) 04/30/2022   Aortic atherosclerosis (HCC)    COPD  (chronic obstructive pulmonary disease) (HCC)    Coronary artery disease 12/15/2020   a.) LHC/PCI 12/15/2020: 99% mLAD (2.25 x 18 mm Onyx Frontier DES), 90% RI (2.75 x 22 mm Onyx Frontier DES), 40% m-dLAD   Diabetes mellitus without complication (HCC)    Diabetes mellitus, type 2 (HCC)    GERD (gastroesophageal reflux disease)    HFrEF (heart failure with reduced ejection fraction) (HCC)    a.) TTE 12/10/2020: EF 40-45%, apical/periapical HK, G2DD; b.) LHC 12/15/2020: EF 35-45%; c.) TTE 06/26/2021: EF 40-45%, LVH, G2DD; d.) TTE 03/21/2022: EF 55-60%, LV/apical sep/ant wall HK, Triv MR, AoV sclerosis   High anion gap metabolic acidosis 04/30/2022   Hyperlipemia    Hypertension    Hypophosphatemia 04/30/2022   Ischemic cardiomyopathy    a.) TTE 12/10/2020: EF 40-45%; b.) LHC 12/15/2020: EF 35-45%; c.) TTE 06/26/2021: EF 40-45%; d.) TTE 03/21/2022: EF 55-60%   Meningioma (HCC)    a.) CT head and MRI brain 08/21/2021: LEFT parietal meningioma   Myocardial infarction (HCC)    Nausea vomiting and diarrhea 04/30/2022   Orthostatic hypotension    PAD (peripheral artery disease) (HCC)    Sepsis secondary to UTI (HCC) 04/30/2022   Tobacco use     Surgical History: Past Surgical History:  Procedure Laterality Date   AMPUTATION Right 06/03/2022   Procedure: AMPUTATION RAY ( 1ST AND 2ND TOE);  Surgeon: Annice Needy, MD;  Location: [facility];  Service: Vascular;  Laterality: Right;   CHOLECYSTECTOMY     CORONARY STENT INTERVENTION N/A  12/15/2020   Procedure: CORONARY STENT INTERVENTION;  Surgeon: Iran Ouch, MD;  Location: [facility];  Service: Cardiovascular;  Laterality: N/A;   INCISION AND DRAINAGE OF WOUND Right 07/09/2022   Procedure: IRRIGATION AND DEBRIDEMENT WOUND;  Surgeon: Linus Galas, DPM;  Location: [facility];  Service: Podiatry;  Laterality: Right;   LEFT HEART CATH AND CORONARY ANGIOGRAPHY N/A 12/15/2020   Procedure: LEFT HEART CATH AND CORONARY ANGIOGRAPHY;  Surgeon:  Iran Ouch, MD;  Location: [facility];  Service: Cardiovascular;  Laterality: N/A;   LOWER EXTREMITY ANGIOGRAPHY Left 12/10/2020   Procedure: Lower Extremity Angiography;  Surgeon: Annice Needy, MD;  Location: [facility];  Service: Cardiovascular;  Laterality: Left;   LOWER EXTREMITY ANGIOGRAPHY Right 12/25/2020   Procedure: LOWER EXTREMITY ANGIOGRAPHY;  Surgeon: Annice Needy, MD;  Location: [facility];  Service: Cardiovascular;  Laterality: Right;   LOWER EXTREMITY ANGIOGRAPHY Right 08/19/2021   Procedure: Lower Extremity Angiography;  Surgeon: Annice Needy, MD;  Location: [facility];  Service: Cardiovascular;  Laterality: Right;   LOWER EXTREMITY ANGIOGRAPHY Right 08/19/2021   Procedure: Lower Extremity Angiography;  Surgeon: Annice Needy, MD;  Location: [facility];  Service: Cardiovascular;  Laterality: Right;   LOWER EXTREMITY ANGIOGRAPHY Right 04/24/2022   Procedure: Lower Extremity Angiography;  Surgeon: Learta Codding, MD;  Location: [facility];  Service: Cardiovascular;  Laterality: Right;   ORIF FEMUR FRACTURE Right 12/14/2021   Procedure: OPEN REDUCTION INTERNAL FIXATION RIGHT DISTAL FEMUR;  Surgeon: Roby Lofts, MD;  Location: [facility];  Service: Orthopedics;  Laterality: Right;    Home Medications:  Allergies as of 12/22/2022   No Known Allergies      Medication List        Accurate as of December 22, 2022  8:38 AM. If you have any questions, ask your nurse or doctor.          acetaminophen 650 MG CR tablet Commonly known as: TYLENOL Take 650 mg by mouth every 8 (eight) hours as needed for pain.   albuterol 108 (90 Base) MCG/ACT inhaler Commonly known as: VENTOLIN HFA Inhale 2 puffs into the lungs every 6 (six) hours as needed for wheezing or shortness of breath.   apixaban 5 MG Tabs tablet Commonly known as: Eliquis Take 1 tablet (5 mg total) by mouth 2 (two) times daily.   ascorbic acid 500 MG  tablet Commonly known as: VITAMIN C Take 1 tablet (500 mg total) by mouth 2 (two) times daily.   aspirin EC 81 MG tablet Take 81 mg by mouth daily. Swallow whole.   atorvastatin 80 MG tablet Commonly known as: LIPITOR Take 1 tablet (80 mg total) by mouth daily.   B-D UF III MINI PEN NEEDLES 31G X 5 MM Misc Generic drug: Insulin Pen Needle See admin instructions.   calcium carbonate 500 MG chewable tablet Commonly known as: TUMS - dosed in mg elemental calcium Chew 1 tablet (200 mg of elemental calcium total) by mouth 3 (three) times daily before meals.   CENTRUM SILVER 50+WOMEN PO Take 1 tablet by mouth daily.   clopidogrel 75 MG tablet Commonly known as: PLAVIX Take 75 mg by mouth daily.   estradiol 0.1 MG/GM vaginal cream Commonly known as: ESTRACE Estrogen Cream Instruction Discard applicator Apply pea sized amount to tip of finger to urethra before bed. Wash hands well after application. Use daily for one week then Monday, Wednesday and Friday  FreeStyle Libre 3 Sensor Misc USE 1 DEVICE EVERY 14 DAYS.   insulin aspart 100 UNIT/ML injection Commonly known as: novoLOG Inject 8 Units into the skin 3 (three) times daily before meals.   Lantus SoloStar 100 UNIT/ML Solostar Pen Generic drug: insulin glargine Inject 35 Units into the skin at bedtime.   nitrofurantoin (macrocrystal-monohydrate) 100 MG capsule Commonly known as: MACROBID Take 1 capsule (100 mg total) by mouth daily.   Ozempic (0.25 or 0.5 MG/DOSE) 2 MG/3ML Sopn Generic drug: Semaglutide(0.25 or 0.5MG /DOS) Inject into the skin.   pantoprazole 40 MG tablet Commonly known as: PROTONIX Take 1 tablet (40 mg total) by mouth daily.   Spiriva HandiHaler 18 MCG inhalation capsule Generic drug: tiotropium 1 capsule daily.   traMADol 50 MG tablet Commonly known as: ULTRAM Take 50 mg by mouth every 6 (six) hours as needed.   zinc sulfate (50mg  elemental zinc) 220 (50 Zn) MG capsule Take 1 capsule (220 mg  total) by mouth daily.        Allergies: No Known Allergies  Family History: Family History  Problem Relation Age of Onset   Hypertension Father     Social History:  reports that she quit smoking about 2 years ago. Her smoking use included cigarettes. She has never been exposed to tobacco smoke. She has never used smokeless tobacco. She reports that she does not drink alcohol. No history on file for drug use.  ROS: Pertinent ROS in HPI  Physical Exam: BP 130/81   Pulse 80   LMP 01/26/2015   Constitutional:  Well nourished. Alert and oriented, No acute distress. HEENT: [place] AT, moist mucus membranes.  Trachea midline Cardiovascular: No clubbing, cyanosis, or edema. Respiratory: Normal respiratory effort, no increased work of breathing. GU: No CVA tenderness.  No bladder fullness or masses.  Recession of labia minora, dry, pale vulvar vaginal mucosa and loss of mucosal ridges and folds.  Normal urethral meatus, no lesions, no prolapse, no discharge.   No urethral masses, tenderness and/or tenderness. No bladder fullness, tenderness or masses.  Pale vagina mucosa, poor estrogen effect, no discharge, no lesions.  Anus and perineum are without rashes or lesions.    Neurologic: Grossly intact, no focal deficits, moving all 4 extremities. Psychiatric: Normal mood and affect.    Laboratory Data: Lab Results  Component Value Date   WBC 4.2 12/05/2022   HGB 11.3 (L) 12/05/2022   HCT 36.5 12/05/2022   MCV 88.6 12/05/2022   PLT 262 12/05/2022   Lab Results  Component Value Date   CREATININE 0.53 12/05/2022   Lab Results  Component Value Date   HGBA1C 8.6 (H) 12/05/2022      Component Value Date/Time   CHOL 97 12/06/2022 0450   HDL 39 (L) 12/06/2022 0450   CHOLHDL 2.5 12/06/2022 0450   VLDL 8 12/06/2022 0450   LDLCALC 50 12/06/2022 0450   Lab Results  Component Value Date   AST 17 11/17/2022   Lab Results  Component Value Date   ALT 14 11/17/2022    Urinalysis See  EPIC and HPI I have reviewed the labs.   Pertinent Imaging: N/A  In and Out Catheterization  Patient is present today for a I & O catheterization due to rUTI's. Patient was cleaned and prepped in a sterile fashion with betadine . A 14 FR cath was inserted no complications were noted , 100 ml of urine return was noted, urine was a deep copper  in color. A clean urine sample was collected  for UA and urine culture. Bladder was drained  And catheter was removed with out difficulty.    Performed by: Michiel Cowboy, PA-C    1. rUTI's -Continue applying the vaginal estrogen cream 3 nights weekly and taking cranberry tablets daily -CATH UA with micro heme -urine culture pending  2. Gross hematuria  -She has been having intermittent gross hematuria that has not been improving with antibiotics -CATH UA with micro heme -Urine culture pending -Explained that the cystoscopy is a safe and common diagnostic test performed by Dr. Richardo Hanks in the office.  It consist of using a thin, lighted tube to look directly inside the bladder and urethra to evaluate the anatomy.  The procedure is brief, typically taking about 5 minutes. -This will enable [place] to assess bladder health and rule out other bladder conditions (stricture disease, stones, cancer, etc.)  -Advised the patient that there are no restrictions to eating or drinking prior to the cystoscopy -They can continue to take all of their medications as prescribed -They can drive themselves to and from the appointment -I explained that during the procedure, the area around the urethra will be cleansed thoroughly, topical anesthetic will be applied to numb your urethra, the thin tube is then gently inserted through the urethra into your bladder while fluid flows through the tube to the bladder to enable better visualization -I explained the procedure is usually not painful, however there may be some discomfort (pinching feeling), and they may feel an urge to  urinate, coolness or fullness in the bladder and then the cystoscope is removed -After the cystoscopy, I advised them that they may experience urinary frequency, hematuria, dysuria which will resolve within 24 to 48 hours -Reviewed red flag signs (fever, bright red blood or blood clots in the urine, abdominal pain or difficulty urinating) and to contact the office immediately or seek treatment in the ED if they should experience any of these -The physician will discuss the results of the cystoscopy at the time of the procedure  -return for cysto   Return for cysto with Dr. Richardo Hanks for gross heme .  These notes generated with voice recognition software. I apologize for typographical errors.  Cloretta Ned  [facility] [address]  [address] [place], [place] [address] [phone]

## 2022-12-25 LAB — CULTURE, URINE COMPREHENSIVE

## 2023-01-12 ENCOUNTER — Other Ambulatory Visit: Payer: Medicaid Other | Admitting: Urology

## 2023-01-24 ENCOUNTER — Other Ambulatory Visit: Payer: Self-pay | Admitting: Urology

## 2023-01-24 DIAGNOSIS — R310 Gross hematuria: Secondary | ICD-10-CM

## 2023-01-24 NOTE — Progress Notes (Deleted)
 01/31/2023 6:30 AM   Beth Carroll 10/01/1970 [id number]  Referring provider: Rudolpho Norleen BIRCH, MD [address] [facility] [address] [facility] [place],  [place] [address]  Urological history: 1. rUTI's -Contributing factors of age, GSM, poorly controlled diabetes and constipation -non contrast CT (03/2022) -no kidney abnormalities, stones or other abnormal urological findings -Documented urine cultures over the last year  November 27th, 2024 No Growth  October 06, 2022 multiple species  Jun 01, 2022 no growth  April 25, 2022 less than 10,000 colonies  March 19, 2022 Streptococcus Agalactiae, E. coli and Pseudomonas aeruginosa -Vaginal estrogen cream 3 nights weekly, cranberry tablets and Macrobid  100 mg daily  No chief complaint on file.  HPI: Beth Carroll is a 52 y.o. female who presents today for follow up with her daughter, [name].   Previous records reviewed.   At her visit on 12/22/2022, she was initially seen by Dr. Francisca in September of this year for recurrent UTIs.  At that time she was given a 5-day course of Cipro  500 mg twice daily for an acute UTI and then placed on a prophylactic nitrofurantoin  for 90 days in addition to topical estrogen cream and cranberry tablets.   She recently was hospitalized for chest pain.  She has been having rust colored urine off and on for the last several weeks in spite of the use of antibiotics.  She has been using vaginal estrogen cream, but not consistently.  Patient denies any modifying or aggravating factors.  Patient denies any recent UTI's dysuria or suprapubic/flank pain.  Patient denies any fevers, chills, nausea or vomiting.   CATH UA greater than 30 RBCs and a few bacteria and 3+ glucose.  PVR 100 mL  Urine culture with no growth.  She has a cystoscopy pending on January 14th.    UA ***  PMH: Past Medical History:  Diagnosis Date   Acute deep vein thrombosis (DVT) of right lower extremity (HCC)    ADHD (attention deficit  hyperactivity disorder)    AKI (acute kidney injury) (HCC) 04/30/2022   Aortic atherosclerosis (HCC)    COPD (chronic obstructive pulmonary disease) (HCC)    Coronary artery disease 12/15/2020   a.) LHC/PCI 12/15/2020: 99% mLAD (2.25 x 18 mm Onyx Frontier DES), 90% RI (2.75 x 22 mm Onyx Frontier DES), 40% m-dLAD   Diabetes mellitus without complication (HCC)    Diabetes mellitus, type 2 (HCC)    GERD (gastroesophageal reflux disease)    HFrEF (heart failure with reduced ejection fraction) (HCC)    a.) TTE 12/10/2020: EF 40-45%, apical/periapical HK, G2DD; b.) LHC 12/15/2020: EF 35-45%; c.) TTE 06/26/2021: EF 40-45%, LVH, G2DD; d.) TTE 03/21/2022: EF 55-60%, LV/apical sep/ant wall HK, Triv MR, AoV sclerosis   High anion gap metabolic acidosis 04/30/2022   Hyperlipemia    Hypertension    Hypophosphatemia 04/30/2022   Ischemic cardiomyopathy    a.) TTE 12/10/2020: EF 40-45%; b.) LHC 12/15/2020: EF 35-45%; c.) TTE 06/26/2021: EF 40-45%; d.) TTE 03/21/2022: EF 55-60%   Meningioma (HCC)    a.) CT head and MRI brain 08/21/2021: LEFT parietal meningioma   Myocardial infarction (HCC)    Nausea vomiting and diarrhea 04/30/2022   Orthostatic hypotension    PAD (peripheral artery disease) (HCC)    Sepsis secondary to UTI (HCC) 04/30/2022   Tobacco use     Surgical History: Past Surgical History:  Procedure Laterality Date   AMPUTATION Right 06/03/2022   Procedure: AMPUTATION RAY ( 1ST AND 2ND TOE);  Surgeon: Marea Mayo  S, MD;  Location: [facility];  Service: Vascular;  Laterality: Right;   CHOLECYSTECTOMY     CORONARY STENT INTERVENTION N/A 12/15/2020   Procedure: CORONARY STENT INTERVENTION;  Surgeon: Darron Deatrice LABOR, MD;  Location: [facility];  Service: Cardiovascular;  Laterality: N/A;   INCISION AND DRAINAGE OF WOUND Right 07/09/2022   Procedure: IRRIGATION AND DEBRIDEMENT WOUND;  Surgeon: Neill Boas, DPM;  Location: [facility];  Service: Podiatry;  Laterality: Right;   LEFT HEART  CATH AND CORONARY ANGIOGRAPHY N/A 12/15/2020   Procedure: LEFT HEART CATH AND CORONARY ANGIOGRAPHY;  Surgeon: Darron Deatrice LABOR, MD;  Location: [facility];  Service: Cardiovascular;  Laterality: N/A;   LOWER EXTREMITY ANGIOGRAPHY Left 12/10/2020   Procedure: Lower Extremity Angiography;  Surgeon: Marea Selinda RAMAN, MD;  Location: [facility];  Service: Cardiovascular;  Laterality: Left;   LOWER EXTREMITY ANGIOGRAPHY Right 12/25/2020   Procedure: LOWER EXTREMITY ANGIOGRAPHY;  Surgeon: Marea Selinda RAMAN, MD;  Location: [facility];  Service: Cardiovascular;  Laterality: Right;   LOWER EXTREMITY ANGIOGRAPHY Right 08/19/2021   Procedure: Lower Extremity Angiography;  Surgeon: Marea Selinda RAMAN, MD;  Location: [facility];  Service: Cardiovascular;  Laterality: Right;   LOWER EXTREMITY ANGIOGRAPHY Right 08/19/2021   Procedure: Lower Extremity Angiography;  Surgeon: Marea Selinda RAMAN, MD;  Location: [facility];  Service: Cardiovascular;  Laterality: Right;   LOWER EXTREMITY ANGIOGRAPHY Right 04/24/2022   Procedure: Lower Extremity Angiography;  Surgeon: Clarice Martin, MD;  Location: [facility];  Service: Cardiovascular;  Laterality: Right;   ORIF FEMUR FRACTURE Right 12/14/2021   Procedure: OPEN REDUCTION INTERNAL FIXATION RIGHT DISTAL FEMUR;  Surgeon: Kendal Franky SQUIBB, MD;  Location: [facility];  Service: Orthopedics;  Laterality: Right;    Home Medications:  Allergies as of 01/31/2023   No Known Allergies      Medication List        Accurate as of January 24, 2023  6:30 AM. If you have any questions, ask your nurse or doctor.          acetaminophen  650 MG CR tablet Commonly known as: TYLENOL  Take 650 mg by mouth every 8 (eight) hours as needed for pain.   albuterol  108 (90 Base) MCG/ACT inhaler Commonly known as: VENTOLIN  HFA Inhale 2 puffs into the lungs every 6 (six) hours as needed for wheezing or shortness of breath.   apixaban  5 MG Tabs  tablet Commonly known as: Eliquis  Take 1 tablet (5 mg total) by mouth 2 (two) times daily.   ascorbic acid  500 MG tablet Commonly known as: VITAMIN C  Take 1 tablet (500 mg total) by mouth 2 (two) times daily.   aspirin  EC 81 MG tablet Take 81 mg by mouth daily. Swallow whole.   atorvastatin  80 MG tablet Commonly known as: LIPITOR  Take 1 tablet (80 mg total) by mouth daily.   B-D UF III MINI PEN NEEDLES 31G X 5 MM Misc Generic drug: Insulin  Pen Needle See admin instructions.   calcium  carbonate 500 MG chewable tablet Commonly known as: TUMS - dosed in mg elemental calcium  Chew 1 tablet (200 mg of elemental calcium  total) by mouth 3 (three) times daily before meals.   CENTRUM SILVER 50+WOMEN PO Take 1 tablet by mouth daily.   clopidogrel  75 MG tablet Commonly known as: PLAVIX  Take 75 mg by mouth daily.   estradiol  0.1 MG/GM vaginal cream Commonly known as: ESTRACE  Estrogen Cream Instruction Discard applicator Apply pea sized amount  to tip of finger to urethra before bed. Wash hands well after application. Use daily for one week then Monday, Wednesday and Friday   FreeStyle Libre 3 Sensor Misc USE 1 DEVICE EVERY 14 DAYS.   insulin  aspart 100 UNIT/ML injection Commonly known as: novoLOG  Inject 8 Units into the skin 3 (three) times daily before meals.   Lantus  SoloStar 100 UNIT/ML Solostar Pen Generic drug: insulin  glargine Inject 35 Units into the skin at bedtime.   nitrofurantoin  (macrocrystal-monohydrate) 100 MG capsule Commonly known as: MACROBID  Take 1 capsule (100 mg total) by mouth daily.   Ozempic (0.25 or 0.5 MG/DOSE) 2 MG/3ML Sopn Generic drug: Semaglutide(0.25 or 0.5MG /DOS) Inject into the skin.   pantoprazole  40 MG tablet Commonly known as: PROTONIX  Take 1 tablet (40 mg total) by mouth daily.   Spiriva  HandiHaler 18 MCG inhalation capsule Generic drug: tiotropium 1 capsule daily.   traMADol  50 MG tablet Commonly known as: ULTRAM  Take 50 mg by  mouth every 6 (six) hours as needed.   zinc  sulfate (50mg  elemental zinc ) 220 (50 Zn) MG capsule Take 1 capsule (220 mg total) by mouth daily.        Allergies: No Known Allergies  Family History: Family History  Problem Relation Age of Onset   Hypertension Father     Social History:  reports that she quit smoking about 2 years ago. Her smoking use included cigarettes. She has never been exposed to tobacco smoke. She has never used smokeless tobacco. She reports that she does not drink alcohol. No history on file for drug use.  ROS: Pertinent ROS in HPI  Physical Exam: LMP 01/26/2015   Constitutional:  Well nourished. Alert and oriented, No acute distress. HEENT: [place] AT, moist mucus membranes.  Trachea midline, no masses. Cardiovascular: No clubbing, cyanosis, or edema. Respiratory: Normal respiratory effort, no increased work of breathing. GU: No CVA tenderness.  No bladder fullness or masses.  Recession of labia minora, dry, pale vulvar vaginal mucosa and loss of mucosal ridges and folds.  Normal urethral meatus, no lesions, no prolapse, no discharge.   No urethral masses, tenderness and/or tenderness. No bladder fullness, tenderness or masses. *** vagina mucosa, *** estrogen effect, no discharge, no lesions, *** pelvic support, *** cystocele and *** rectocele noted.  No cervical motion tenderness.  Uterus is freely mobile and non-fixed.  No adnexal/parametria masses or tenderness noted.  Anus and perineum are without rashes or lesions.   ***  Neurologic: Grossly intact, no focal deficits, moving all 4 extremities. Psychiatric: Normal mood and affect.    Laboratory Data: Urinalysis See EPIC and HPI I have reviewed the labs.   Pertinent Imaging: N/A  1. rUTI's/GSM -Continue applying the vaginal estrogen cream 3 nights weekly and taking cranberry tablets daily -UA *** -urine culture pending  2. Gross hematuria  -She has been having intermittent gross hematuria that has  not been improving with antibiotics with negative urine cultures -UA with micro heme -Urine culture pending in preparation for upcoming cysto  -encourage to return for cystoscopy to help rule out bladder cancer   No follow-ups on file.  These notes generated with voice recognition software. I apologize for typographical errors.  CLOTILDA HELON RIGGERS  [facility] [address]  [address] [place], [place] [address] [phone]

## 2023-01-24 NOTE — Progress Notes (Signed)
Error

## 2023-01-31 ENCOUNTER — Ambulatory Visit: Payer: Medicaid Other | Admitting: Urology

## 2023-01-31 DIAGNOSIS — R310 Gross hematuria: Secondary | ICD-10-CM

## 2023-01-31 DIAGNOSIS — N390 Urinary tract infection, site not specified: Secondary | ICD-10-CM

## 2023-01-31 DIAGNOSIS — N952 Postmenopausal atrophic vaginitis: Secondary | ICD-10-CM

## 2023-02-08 ENCOUNTER — Ambulatory Visit (INDEPENDENT_AMBULATORY_CARE_PROVIDER_SITE_OTHER): Payer: Medicaid Other | Admitting: Urology

## 2023-02-08 ENCOUNTER — Other Ambulatory Visit
Admission: RE | Admit: 2023-02-08 | Discharge: 2023-02-08 | Disposition: A | Discharge status: Home / Self Care | Payer: Medicaid Other | Attending: Urology | Admitting: Urology

## 2023-02-08 VITALS — BP 102/67 | HR 74

## 2023-02-08 DIAGNOSIS — R310 Gross hematuria: Secondary | ICD-10-CM | POA: Diagnosis present

## 2023-02-08 DIAGNOSIS — N390 Urinary tract infection, site not specified: Secondary | ICD-10-CM

## 2023-02-08 DIAGNOSIS — D494 Neoplasm of unspecified behavior of bladder: Secondary | ICD-10-CM | POA: Diagnosis not present

## 2023-02-08 DIAGNOSIS — Z87440 Personal history of urinary (tract) infections: Secondary | ICD-10-CM

## 2023-02-08 LAB — URINALYSIS, COMPLETE (UACMP) WITH MICROSCOPIC
Bilirubin Urine: NEGATIVE
Glucose, UA: 100 mg/dL — AB
Ketones, ur: NEGATIVE mg/dL
Nitrite: NEGATIVE
RBC / HPF: >50 RBC/hpf (ref 0–5)
Specific Gravity, Urine: 1.015 (ref 1.005–1.030)
pH: 6 (ref 5.0–8.0)

## 2023-02-08 MED ORDER — SULFAMETHOXAZOLE-TRIMETHOPRIM 800-160 MG PO TABS
1.0000 | ORAL_TABLET | Freq: Once | ORAL | Status: AC
  Administered 2023-02-08: 1 via ORAL

## 2023-02-08 NOTE — Patient Instructions (Signed)
Bladder Biopsy A bladder biopsy is a procedure to remove a small sample of tissue from the bladder. The tissue is checked under a microscope to diagnose or rule out bladder cancer. During a bladder biopsy, your health care provider may guide a long, thin instrument with a lighted camera (cystoscope) through your urethra and into your bladder. This lets your health care provider see the lining of your urethra and bladder and take a tissue sample. If your ureters need to be checked, a longer tube (ureteroscope) may be used instead. Tell your health care provider about: Any allergies you have. All medicines you are taking, including vitamins, herbs, eye drops, creams, and over-the-counter medicines. Any problems you or family members have had with anesthetic medicines. Any bleeding problems you have. Any surgeries you have had. Any medical conditions you have. Whether you are pregnant or may be pregnant. What are the risks? Generally, this is a safe procedure. However, problems may occur, including: Bleeding. Infection. Allergic reactions to medicines. Damage to nearby structures or organs. These may include the urethra, bladder, and ureters. Pain in the abdomen and pain when you urinate. Narrowing of the urethra from scar tissue. Trouble urinating from swelling. What happens before the procedure? Medicines Ask your health care provider about: Changing or stopping your regular medicines. This is especially important if you are taking diabetes medicines or blood thinners. Taking medicines such as aspirin and ibuprofen. These medicines can thin your blood. Do not take these medicines unless your health care provider tells you to take them. Taking over-the-counter medicines, vitamins, herbs, and supplements. Surgery safety Ask your health care provider: How your surgery site will be marked. What steps will be taken to help prevent infection. These steps may include: Removing hair at the  surgery site. Washing skin with a germ-killing soap. Receiving antibiotic medicine. General instructions Follow instructions from your health care provider about eating and drinking restrictions. You may be asked to drink plenty of fluids. You may be asked to urinate right before the procedure. Your urine may be tested for infection. If you will be going home right after the procedure, plan to have a responsible adult: Take you home from the hospital or clinic. You will not be allowed to drive. Care for you for the time you are told. What happens during the procedure?  An IV may be inserted into one of your veins. You may be given one or more of the following: A medicine to help you relax (sedative). A medicine to numb the opening of the urethra (local anesthetic). A medicine to make you fall asleep (general anesthetic). You will lie on your back with your knees bent and spread apart. The cystoscope or ureteroscope will be put into your urethra and guided into your bladder or ureters. Your bladder may be filled with germ-free (sterile) water. This will help your health care provider see the wall or lining of your bladder. Small instruments will be put through the scope to take a tissue sample to look at under a microscope. The procedure may vary among health care providers and hospitals. What happens after the procedure? Your blood pressure, heart rate, breathing rate, and blood oxygen level will be monitored until you leave the hospital or clinic. You may be asked to empty your bladder, or it may be emptied for you. If you were given a sedative during the procedure, it can affect you for several hours. Do not drive or operate machinery until your health care provider says that  it is safe. Summary A bladder biopsy is a procedure to remove a small tissue sample from the bladder. A bladder biopsy can be done to diagnose or rule out bladder cancer. Follow instructions from your health care  provider about what you can eat or drink and about any changes to your medicines. Plan to have a responsible adult take you home and care for you as told. This information is not intended to replace advice given to you by your health care provider. Make sure you discuss any questions you have with your health care provider. Document Revised: 12/21/2020 Document Reviewed: 12/21/2020 Elsevier Patient Education  2024 [employer].

## 2023-02-08 NOTE — Progress Notes (Signed)
 Cystoscopy Procedure Note:  Indication: Gross hematuria  After informed consent and discussion of the procedure and its risks, Beth Carroll was positioned and prepped in the standard fashion. Cystoscopy was performed with a flexible cystoscope.  Urine was blood-tinged which limited vision.  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  Difficult to ascertain scope and size of lesion based on limited visibility.  Cytology was sent.  Findings: Vision limited by bloody urine, but papillary tumor visualized at posterior bladder wall  ---------------------------------------------------------------------------  Assessment and Plan: Comorbid 53 year old female with recurrent UTIs and persistent gross hematuria, infections have improved with cranberry tablet prophylaxis and topical estrogen cream.  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  We discussed transurethral resection of bladder tumor (TURBT) and risks and benefits at length. This is typically a 1 to 2-hour procedure done under general anesthesia in the operating room.  A scope is inserted through the urethra and used to resect abnormal tissue within the bladder, which is then sent to the pathologist to determine grade and stage of the tumor.  Risks include bleeding, infection, need for temporary Foley placement, and bladder perforation.  Treatment strategies are based on the type of tumor and depth of invasion.  We briefly reviewed the different treatment pathways for non-muscle invasive and muscle invasive bladder cancer.  Will need clearance to hold anticoagulation with history of DVT Schedule TURBT with bilateral retrograde pyelograms, gemcitabine   Beth Burnet, MD 02/08/2023

## 2023-02-09 ENCOUNTER — Other Ambulatory Visit: Payer: Self-pay

## 2023-02-09 ENCOUNTER — Telehealth: Payer: Self-pay

## 2023-02-09 DIAGNOSIS — D494 Neoplasm of unspecified behavior of bladder: Secondary | ICD-10-CM

## 2023-02-09 NOTE — Progress Notes (Signed)
 Surgical Physician Order Form [facility]  Dr. Jay Meth, MD  * Scheduling expectation : Next Available  *Length of Case: 1 hour  *Clearance needed: yes, to hold anticoagulation with history of DVT  *Anticoagulation Instructions: Hold all anticoagulants  *Aspirin  Instructions: Hold Aspirin  and Plavix   *Post-op visit Date/Instructions:  1-2 week with pathology review  *Diagnosis: Bladder Tumor  *Procedure:  TURBT 2-5cm (16109), bilateral retrograde pyelograms   Additional orders: Gemcitabine  2000mg  bladder instillation  -Admit type: OUTpatient  -Anesthesia: General  -VTE Prophylaxis Standing Order SCD's       Other:   -Standing Lab Orders Per Anesthesia    Lab other: UA&Urine Culture  -Standing Test orders EKG/Chest x-ray per Anesthesia       Test other:   - Medications: Gentamicin  iv  -Other orders:  N/A

## 2023-02-09 NOTE — Progress Notes (Signed)
  Phone Number: [phone] for Surgical Coordinator Fax Number: [phone]  REQUEST FOR SURGICAL CLEARANCE       Date: Date: 02/09/2023  Faxed to: Dr. Vonna Guardian  Surgeon: Dr. Jay Meth, MD     Date of Surgery: 02/18/2023  Operation: Transurethral Resection of Bladder Tumor with Intravesical Instillation of Gemcitabine  and Bilateral Retrograde Pyelograms   Anesthesia Type: General   Diagnosis: Bladder Tumor  Patient Requires:   Cardiac / Vascular Clearance : Yes  Reason: Patient will need to hold Plavix  and Eliquis  prior to surgery.    Risk Assessment:    Low   []       Moderate   []     High   []           This patient is optimized for surgery  YES []       NO   []    I recommend further assessment/workup prior to surgery. YES []      NO  []   Appointment scheduled for: _______________________   Further recommendations: ____________________________________     Physician Signature:__________________________________   Printed Name: ________________________________________   Date: _________________

## 2023-02-09 NOTE — Telephone Encounter (Signed)
 Per Dr. Estanislao Heimlich, Patient is to be scheduled for  Transurethral Resection of Bladder Tumor with Intravesical Instillation of Gemcitabine  and Bilateral Retrograde Pyelograms   Beth Carroll was contacted and possible surgical dates were discussed, Friday January 24th, 2025 was agreed upon for surgery.   Patient was directed to call [phone] between 1-3pm the day before surgery to find out surgical arrival time.  Instructions were given not to eat or drink from midnight on the night before surgery and have a driver for the day of surgery. On the surgery day patient was instructed to enter through the [facility] entrance of [facility] report the Same Day Surgery desk.   Pre-Admit Testing will be in contact via phone to set up an interview with the anesthesia team to review your history and medications prior to surgery.   Reminder of this information was sent via MyChart to the patient.   We have sent over clearances to Dr. Vonna Guardian and Dr. Gollan, awaiting response.

## 2023-02-09 NOTE — Progress Notes (Signed)
   [facility] Surgical Posting Form  Surgery Date: Date: 02/18/2023  Surgeon: Dr. Jay Meth, MD  Inpt ( No  )   Outpt (Yes)   Obs ( No  )   Diagnosis: D49.4 Bladder Tumor  -CPT: 16109, [id number], [phone]  Surgery: Transurethral Resection of Bladder Tumor with Intravesical Instillation of Gemcitabine  and Bilateral Retrograde Pyelograms  Stop Anticoagulations: Yes, will need to hold Plavix , Eliquis  and Semaglutide  Cardiac/Medical/Pulmonary Clearance needed: yes  Clearance needed from Dr: Vonna Guardian and Dr. Jerelene Monday  Clearance request sent on: Date: 02/09/23   *Orders entered into EPIC  Date: 02/09/23   *Case booked in EPIC  Date: 02/09/23  *Notified pt of Surgery: Date: 02/09/23  PRE-OP UA & CX: yes, was obtained on 02/08/23 in clinic  *Placed into Prior Authorization Work [place] Date: 02/09/23  Assistant/laser/rep:No

## 2023-02-09 NOTE — Telephone Encounter (Signed)
-----   Message from [place] sent at 02/09/2023 12:59 PM EST ----- OK to stop Plavix  5 days before surgery and Eliquis  two days before her surgery.  She is relatively low risk.  Resume as soon as felt safe after surgery. ----- Message ----- From: Sheryn Doom, CMA Sent: 02/09/2023  12:22 PM EST To: [employer], MD  Surgical Clearance

## 2023-02-09 NOTE — Progress Notes (Signed)
  Phone Number: [phone] for Surgical Coordinator Fax Number: [phone]  REQUEST FOR SURGICAL CLEARANCE       Date: Date: 02/09/23  Faxed to: Dr. Lisbeth [facility]  Surgeon: Dr. Jay Meth, MD     Date of Surgery: 02/18/2023  Operation: Transurethral Resection of Bladder Tumor with Intravesical Instillation of Gemcitabine  and Bilateral Retrograde Pyelograms   Anesthesia Type: General   Diagnosis: Bladder Tumor  Patient Requires:   Cardiac / Vascular Clearance : Yes  Reason: Patient will need to hold Plavix  and Eliquis  prior to surgery.   Risk Assessment:    Low   []       Moderate   []     High   []           This patient is optimized for surgery  YES []       NO   []    I recommend further assessment/workup prior to surgery. YES []      NO  []   Appointment scheduled for: _______________________   Further recommendations: ____________________________________     Physician Signature:__________________________________   Printed Name: ________________________________________   Date: _________________

## 2023-02-09 NOTE — Telephone Encounter (Signed)
 Received clearance from Dr. Vonna Guardian. Patient made aware to hold medication.

## 2023-02-10 ENCOUNTER — Telehealth: Payer: Self-pay | Admitting: *Deleted

## 2023-02-10 LAB — URINE CULTURE: Culture: NO GROWTH

## 2023-02-10 NOTE — Telephone Encounter (Signed)
   Name: Beth Carroll  DOB: 1970/04/07  MRN: [id number]  Primary Cardiologist: Julien Nordmann, MD  Chart reviewed as part of pre-operative protocol coverage. Because of Beth Carroll past medical history and time since last visit, she will require a follow-up in-office visit in order to better assess preoperative cardiovascular risk. Pt has not been seen in office since 2023.   Pre-op covering staff: - Please schedule appointment and call patient to inform them. If patient already had an upcoming appointment within acceptable timeframe, please add "pre-op clearance" to the appointment notes so provider is aware. - Please contact requesting surgeon's office via preferred method (i.e, phone, fax) to inform them of need for appointment prior to surgery.   02/10/2023, 1:32 PM

## 2023-02-10 NOTE — Telephone Encounter (Signed)
   Pre-operative Risk Assessment    Patient Name: Beth Carroll  DOB: 06-26-1970 MRN: [id number]   Date of last office visit: 05/29/21 [name], [facility] Date of next office visit: NONE   Request for Surgical Clearance    Procedure: Transurethral Resection of Bladder Tumor with Intravesical Instillation of Gemcitabine and Bilateral Retrograde Pyelograms    Date of Surgery:  Clearance 02/18/23                                Surgeon:  DR. Richardo Hanks Surgeon's Group or Practice Name:  [facility] Phone number:  [phone] Fax number:  [phone]   Type of Clearance Requested:   - Medical  - Pharmacy:  Hold Clopidogrel (Plavix) and Apixaban (Eliquis)     Type of Anesthesia:  General    Additional requests/questions:    [name]   02/10/2023, 11:27 AM    Phone Number: [phone] for Surgical Coordinator Fax Number: [phone]   REQUEST FOR SURGICAL CLEARANCE                                           Date: Date: 02/09/23   Faxed to: Dr. In[facility]   Surgeon: Dr. Legrand Rams, MD             Date of Surgery: 02/18/2023   Operation: Transurethral Resection of Bladder Tumor with Intravesical Instillation of Gemcitabine and Bilateral Retrograde Pyelograms    Anesthesia Type: General    Diagnosis: Bladder Tumor   Patient Requires:    Cardiac / Vascular Clearance : Yes   Reason: Patient will need to hold Plavix and Eliquis prior to surgery.    Risk Assessment:     Low   []       Moderate   []     High   []                 This patient is optimized for surgery  YES []       NO   []      I recommend further assessment/workup prior to surgery. YES []      NO  []    Appointment scheduled for: _______________________    Further recommendations: ____________________________________        Physician Signature:__________________________________    Printed Name: ________________________________________    Date: _________________             Electronically signed by Letta Kocher, CMA at 02/09/2023 12:22 PM

## 2023-02-10 NOTE — Telephone Encounter (Signed)
-----   Message from [facility] Melissa H sent at 02/09/2023 12:22 PM EST ----- Surgical Clearance

## 2023-02-10 NOTE — Telephone Encounter (Signed)
Called and spoke to patient scheduled appointment for surgical clearance

## 2023-02-10 NOTE — Progress Notes (Signed)
CLEARANCE REQUEST HAS BEEN ENTERED IN PREOP PROTOCOL FORMAT AND SENT TO PRE[facility] FOR REVIEW.

## 2023-02-11 NOTE — Progress Notes (Unsigned)
Cardiology Clinic Note   Patient Name: Beth Carroll Date of Encounter: 02/15/2023  Primary Care Provider:  Gracelyn Nurse, MD Primary Cardiologist:  Julien Nordmann, MD  Patient Profile    Beth Carroll 53 year old female presents the clinic today for follow-up evaluation of her ischemic cardiomyopathy and preoperative cardiac evaluation.  Past Medical History    Past Medical History:  Diagnosis Date   Acute deep vein thrombosis (DVT) of right lower extremity (HCC)    ADHD (attention deficit hyperactivity disorder)    AKI (acute kidney injury) (HCC) 04/30/2022   Aortic atherosclerosis (HCC)    Atrial fibrillation (HCC) 06/2022   COPD (chronic obstructive pulmonary disease) (HCC)    Coronary artery disease 12/15/2020   a.) LHC/PCI 12/15/2020: 99% mLAD (2.25 x 18 mm Onyx Frontier DES), 90% RI (2.75 x 22 mm Onyx Frontier DES), 40% m-dLAD   Diabetes mellitus without complication (HCC)    Diabetes mellitus, type 2 (HCC)    Essential hypertension    GERD (gastroesophageal reflux disease)    HFrEF (heart failure with reduced ejection fraction) (HCC)    a.) TTE 12/10/2020: EF 40-45%, apical/periapical HK, G2DD; b.) LHC 12/15/2020: EF 35-45%; c.) TTE 06/26/2021: EF 40-45%, LVH, G2DD; d.) TTE 03/21/2022: EF 55-60%, LV/apical sep/ant wall HK, Triv MR, AoV sclerosis   High anion gap metabolic acidosis 04/30/2022   Hyperlipemia    Hypophosphatemia 04/30/2022   Ischemic cardiomyopathy    a.) TTE 12/10/2020: EF 40-45%; b.) LHC 12/15/2020: EF 35-45%; c.) TTE 06/26/2021: EF 40-45%; d.) TTE 03/21/2022: EF 55-60%   Meningioma (HCC)    a.) CT head and MRI brain 08/21/2021: LEFT parietal meningioma   Myocardial infarction (HCC)    Nausea vomiting and diarrhea 04/30/2022   Orthostatic hypotension    PAD (peripheral artery disease) (HCC)    Sepsis secondary to UTI (HCC) 04/30/2022   Tobacco use    Past Surgical History:  Procedure Laterality Date   AMPUTATION Right 06/03/2022    Procedure: AMPUTATION RAY ( 1ST AND 2ND TOE);  Surgeon: Annice Needy, MD;  Location: [facility] ORS;  Service: Vascular;  Laterality: Right;   CHOLECYSTECTOMY     CORONARY STENT INTERVENTION N/A 12/15/2020   Procedure: CORONARY STENT INTERVENTION;  Surgeon: Iran Ouch, MD;  Location: [facility];  Service: Cardiovascular;  Laterality: N/A;   INCISION AND DRAINAGE OF WOUND Right 07/09/2022   Procedure: IRRIGATION AND DEBRIDEMENT WOUND;  Surgeon: Linus Galas, DPM;  Location: [facility] OR[facility];  Service: Podiatry;  Laterality: Right;   LEFT HEART CATH AND CORONARY ANGIOGRAPHY N/A 12/15/2020   Procedure: LEFT HEART CATH AND CORONARY ANGIOGRAPHY;  Surgeon: Iran Ouch, MD;  Location: [facility];  Service: Cardiovascular;  Laterality: N/A;   LOWER EXTREMITY ANGIOGRAPHY Left 12/10/2020   Procedure: Lower Extremity Angiography;  Surgeon: Annice Needy, MD;  Location: [facility];  Service: Cardiovascular;  Laterality: Left;   LOWER EXTREMITY ANGIOGRAPHY Right 12/25/2020   Procedure: LOWER EXTREMITY ANGIOGRAPHY;  Surgeon: Annice Needy, MD;  Location: [facility];  Service: Cardiovascular;  Laterality: Right;   LOWER EXTREMITY ANGIOGRAPHY Right 08/19/2021   Procedure: Lower Extremity Angiography;  Surgeon: Annice Needy, MD;  Location: [facility];  Service: Cardiovascular;  Laterality: Right;   LOWER EXTREMITY ANGIOGRAPHY Right 08/19/2021   Procedure: Lower Extremity Angiography;  Surgeon: Annice Needy, MD;  Location: [facility];  Service: Cardiovascular;  Laterality: Right;   LOWER EXTREMITY ANGIOGRAPHY Right 04/24/2022  Procedure: Lower Extremity Angiography;  Surgeon: Learta Codding, MD;  Location: [facility];  Service: Cardiovascular;  Laterality: Right;   ORIF FEMUR FRACTURE Right 12/14/2021   Procedure: OPEN REDUCTION INTERNAL FIXATION RIGHT DISTAL FEMUR;  Surgeon: Roby Lofts, MD;  Location: [facility];  Service: Orthopedics;  Laterality:  Right;    Allergies  No Known Allergies  History of Present Illness    Beth Carroll has a PMH of coronary artery disease status post PCI to her LAD and ramus 11/22.,  Peripheral arterial disease status post intervention and 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  She was seen and evaluated 12/06/2022 by Eula Listen, PA-C for concerns of/complaints of chest discomfort.  She reported right sided chest discomfort for approximately 6 months.  This resolved without intervention.  3 weeks prior to her evaluation she noted sharp and intermittent left-sided chest discomfort that was present for several days.  Her pain was not exacerbated by any activities.  She had not tried to alleviate the pain.  Her pain was associated with shortness of breath.  She also noted some left-sided chest pain that was reproducible with palpation.  She denied similar symptoms when compared to her previous PCI in 2020.  Her pain was relieved with sublingual nitroglycerin in the emergency department.  Her blood pressure was initially soft and in the low 100s systolic.  On reevaluation her blood pressure was 140s-170 systolic.  Her heart rates were in the 60s-80s.  She was afebrile.  She was saturating 100% on room air.  Her EKG showed sinus rhythm with baseline artifact and no ST-T wave changes.  Her high-sensitivity troponins were negative x 2.  Her chest x-ray showed no acute cardiopulmonary disease.  Her echocardiogram showed an LVEF of 50-55%, normal diastolic parameters, akinesis of the left ventricle, mid-apical anterior septal wall anterior segment and apical segment, normal RV function, trivial mitral valve regurgitation and no other significant valvular abnormalities.  She presents to the clinic today for preoperative cardiac evaluation and states she occasionally has episodes of low blood pressure.  Her blood pressure  initially today was noted to be 76/50 and on recheck is 82/58.  She reports that she normally wears lower extremity support stockings.  She does not have them on today.  We reviewed the importance of maintaining good p.o. hydration, increasing the salt in her diet, and continuing lower extremity support stockings.  We reviewed her upcoming surgery.  She is able to perform regular housework and is limited in her walking due to right knee surgery.  She is able to complete greater than 4 METS of physical activity.  I will plan follow-up in [place] in 6 to 9 months..  Today she denies chest pain, shortness of breath, lower extremity edema, fatigue, palpitations, melena, hematuria, hemoptysis, diaphoresis, weakness, presyncope, syncope, orthopnea, and PND.      Home Medications    Prior to Admission medications   Medication Sig Start Date End Date Taking? Authorizing Provider  acetaminophen (TYLENOL) 650 MG CR tablet Take 650 mg by mouth every 8 (eight) hours as needed for pain.    [provider]  albuterol (VENTOLIN HFA) 108 (90 Base) MCG/ACT inhaler Inhale 2 puffs into the lungs every 6 (six) hours as needed for wheezing or shortness of breath.    [provider]  apixaban (ELIQUIS) 5 MG TABS tablet Take 1 tablet (5 mg total) by mouth 2 (  two) times daily. 11/09/22   Georgiana Spinner, NP  ascorbic acid (VITAMIN C) 500 MG tablet Take 1 tablet (500 mg total) by mouth 2 (two) times daily. 07/14/22   Sunnie Nielsen, DO  aspirin EC 81 MG tablet Take 81 mg by mouth daily. Swallow whole.    [provider]  atorvastatin (LIPITOR) 80 MG tablet Take 80 mg by mouth daily.    [provider]  B-D UF III MINI PEN NEEDLES 31G X 5 MM MISC See admin instructions. 07/09/22   [provider]  calcium carbonate (TUMS - DOSED IN MG ELEMENTAL CALCIUM) 500 MG chewable tablet Chew 1 tablet (200 mg of elemental calcium total) by mouth 3 (three) times daily before  meals. Patient taking differently: Chew 1 tablet by mouth daily as needed for heartburn or indigestion. 04/28/22   Pennie Banter, DO  clopidogrel (PLAVIX) 75 MG tablet Take 75 mg by mouth daily. 11/24/22   [provider]  Continuous Glucose Sensor (FREESTYLE LIBRE 3 SENSOR) MISC USE 1 DEVICE EVERY 14 DAYS. 07/05/22   [provider]  estradiol (ESTRACE) 0.1 MG/GM vaginal cream Estrogen Cream Instruction Discard applicator Apply pea sized amount to tip of finger to urethra before bed. Wash hands well after application. Use daily for one week then Monday, Wednesday and Friday 10/06/22   Sondra Come, MD  Insulin Aspart FlexPen (NOVOLOG) 100 UNIT/ML Inject 20 Units into the skin with breakfast, with lunch, and with evening meal. 01/11/23   [provider]  insulin glargine (LANTUS SOLOSTAR) 100 UNIT/ML Solostar Pen Inject 35 Units into the skin at bedtime. Patient taking differently: Inject 20 Units into the skin at bedtime. 07/14/22   Sunnie Nielsen, DO  Multiple Vitamins-Minerals (CENTRUM SILVER 50+WOMEN PO) Take 1 tablet by mouth daily.    [provider]  nitrofurantoin, macrocrystal-monohydrate, (MACROBID) 100 MG capsule Take 1 capsule (100 mg total) by mouth daily. 10/06/22   Sondra Come, MD  pantoprazole (PROTONIX) 40 MG tablet Take 1 tablet (40 mg total) by mouth daily. 12/17/20   Elgergawy, Leana Roe, MD  SPIRIVA HANDIHALER 18 MCG inhalation capsule Place 18 mcg into inhaler and inhale daily. 12/15/22   [provider]  traMADol (ULTRAM) 50 MG tablet Take 50 mg by mouth every 6 (six) hours as needed for severe pain (pain score 7-10). 07/22/22   [provider]  zinc sulfate 220 (50 Zn) MG capsule Take 1 capsule (220 mg total) by mouth daily. 07/14/22   Sunnie Nielsen, DO    Family History    Family History  Problem Relation Age of Onset   Hypertension Father    She indicated that her mother is alive. She indicated that her  father is deceased.  Social History    Social History   Socioeconomic History   Marital status: Divorced    Spouse name: Not on file   Number of children: 2   Years of education: Not on file   Highest education level: Not on file  Occupational History   Not on file  Tobacco Use   Smoking status: Former    Current packs/day: 0.00    Types: Cigarettes    Quit date: 12/09/2020    Years since quitting: 2.1    Passive exposure: Never   Smokeless tobacco: Never  Vaping Use   Vaping status: Never Used  Substance and Sexual Activity   Alcohol use: Not Currently   Drug use: Never   Sexual activity: Not on file  Other Topics Concern   Not on file  Social History Narrative   Lives with daughter and boyfriend    Social Drivers of Health   Financial Resource Strain: Low Risk  (07/15/2022)   Received from [facility], [facility]   Overall Financial Resource Strain (CARDIA)    Difficulty of Paying Living Expenses: Not hard at all  Food Insecurity: No Food Insecurity (07/15/2022)   Received from [facility], [facility]   Hunger Vital Sign    Worried About Running Out of Food in the Last Year: Never true    Ran Out of Food in the Last Year: Never true  Transportation Needs: No Transportation Needs (07/18/2022)   Received from [facility], [facility]   [facility] - Transportation    In the past 12 months, has lack of transportation kept you from medical appointments or from getting medications?: No    Lack of Transportation (Non-Medical): No  Physical Activity: Insufficiently Active (04/02/2022)   Received from [facility], [facility]   Exercise Vital Sign    Days of Exercise per Week: 7 days    Minutes of Exercise per Session: 20 min  Stress: No Stress Concern Present (04/02/2022)   Received from [facility], [facility]   [employer] of Occupational Health - Occupational Stress Questionnaire    Feeling of Stress : Not at all  Social Connections: Moderately Isolated (04/02/2022)   Received from [facility], [facility]   Social Connection and Isolation Panel [NHANES]    Frequency of Communication with Friends and Family: More than three times a week    Frequency of Social Gatherings with Friends and Family: Twice a week    Attends Religious Services: More than 4 times per year    Active Member of [employer] or Organizations: No    Attends Banker Meetings: Never    Marital Status: Divorced  Catering manager Violence: Not At Risk (07/05/2022)   Humiliation, Afraid, Rape, and Kick questionnaire    Fear of Current or Ex-Partner: No    Emotionally Abused: No    Physically Abused: No    Sexually Abused: No     Review of Systems    General:  No chills, fever, night sweats or weight changes.  Cardiovascular:  No chest pain, dyspnea on exertion, edema, orthopnea, palpitations, paroxysmal nocturnal dyspnea. Dermatological: No rash, lesions/masses Respiratory: No cough, dyspnea Urologic: No hematuria, dysuria Abdominal:   No nausea, vomiting, diarrhea, bright red blood per rectum, melena, or hematemesis Neurologic:  No visual changes, wkns, changes in mental status. All other systems reviewed and are otherwise negative except as noted above.  Physical Exam    VS:  BP (!) 82/58   Pulse 81   Ht 5\' 6"  (1.676 m)   Wt 138 lb (62.6 kg)   LMP 01/26/2015   SpO2 98%   BMI 22.27 kg/m  , BMI Body mass index is 22.27 kg/m. GEN: Well nourished, well developed, in no acute distress. HEENT: normal. Neck: Supple, no JVD, carotid bruits, or masses. Cardiac: RRR, no murmurs, rubs, or gallops. No clubbing, cyanosis, edema.  Radials/DP/PT 2+ and equal bilaterally.  Respiratory:  Respirations regular and unlabored, clear to auscultation bilaterally. GI:  Soft, nontender, nondistended, BS + x 4. MS: no deformity or atrophy. Skin: warm and dry, no rash. Neuro:  Strength and sensation are  intact. Psych: Normal affect.  Accessory Clinical Findings    Recent Labs: 04/28/2022: Magnesium 1.8 11/17/2022: ALT 14 12/05/2022: BUN 15; Creatinine, Ser 0.53; Hemoglobin 11.3; Platelets 262; Potassium 4.0; Sodium 136   Recent Lipid Panel    Component Value Date/Time   CHOL 97 12/06/2022 0450   TRIG 41 12/06/2022 0450   HDL 39 (L) 12/06/2022 0450   CHOLHDL 2.5 12/06/2022 0450   VLDL 8 12/06/2022 0450   LDLCALC 50 12/06/2022 0450         ECG personally reviewed by me today- EKG Interpretation Date/Time:  Tuesday February 15 2023 13:46:39 EST Ventricular Rate:  81 PR Interval:  164 QRS Duration:  90 QT Interval:  402 QTC Calculation: 466 R Axis:   -54  Text Interpretation: Normal sinus rhythm Left axis deviation Confirmed by Edd Fabian [phone]) on 02/15/2023 2:27:19 PM      Echocardiogram 12/06/2022  IMPRESSIONS     1. Left ventricular ejection fraction, by estimation, is 50 to 55%. The  left ventricle has low normal function. The left ventricle demonstrates  regional wall motion abnormalities (see scoring diagram/findings for  description). Left ventricular diastolic   parameters were normal. There is akinesis of the left ventricular,  mid-apical anteroseptal wall, anterior segment and apical segment.   2. Right ventricular systolic function is normal. The right ventricular  size is normal.   3. The mitral valve is normal in structure. Trivial mitral valve  regurgitation. No evidence of mitral stenosis.   4. The aortic valve is normal in structure. Aortic valve regurgitation is  not visualized. No aortic stenosis is present.   5. The inferior vena cava is normal in size with greater than 50%  respiratory variability, suggesting right atrial pressure of 3 mmHg.   FINDINGS   Left Ventricle: Left ventricular ejection fraction,  by estimation, is 50  to 55%. The left ventricle has low normal function. The left ventricle  demonstrates regional wall motion abnormalities. Definity contrast agent  was given IV to delineate the left  ventricular endocardial borders. The left ventricular internal cavity size  was normal in size. There is borderline left ventricular hypertrophy. Left  ventricular diastolic parameters were normal.   Right Ventricle: The right ventricular size is normal. No increase in  right ventricular wall thickness. Right ventricular systolic function is  normal.   Left Atrium: Left atrial size was normal in size.   Right Atrium: Right atrial size was normal in size.   Pericardium: There is no evidence of pericardial effusion.   Mitral Valve: The mitral valve is normal in structure. Trivial mitral  valve regurgitation. No evidence of mitral valve stenosis. MV peak  gradient, 3.8 mmHg. The mean mitral valve gradient is 2.0 mmHg.   Tricuspid Valve: The tricuspid valve is normal in structure. Tricuspid  valve regurgitation is not demonstrated. No evidence of tricuspid  stenosis.   Aortic Valve: The aortic valve is normal in structure. Aortic valve  regurgitation is not visualized. No aortic stenosis is present. Aortic  valve mean gradient measures 2.0 mmHg. Aortic valve peak gradient measures  5.8 mmHg. Aortic valve area, by VTI  measures 2.74 cm.   Pulmonic Valve: The pulmonic valve was normal in structure. Pulmonic valve  regurgitation is not visualized. No evidence of pulmonic stenosis.   Aorta: The aortic root is normal in size and structure.   Venous: The inferior vena cava is normal in size with greater than 50%  respiratory variability, suggesting right atrial pressure of 3 mmHg.  IAS/Shunts: No atrial level shunt detected by color flow Doppler.       Assessment & Plan   1.  Coronary artery disease-no chest pain today.  Recently seen in the emergency department with chest  discomfort.  High sensitive troponins were negative.  Echocardiogram reassuring. Continue to monitor  No plans for ischemic evaluation  Heart failure with improved EF, ischemic cardiomyopathy-no increased DOE or activity intolerance.  NYHA class I-II.  Echocardiogram 12/06/2022 showed an LVEF of 50-55%, trivial mitral valve regurgitation and no other significant valvular abnormalities. Continue current medical therapy Increase physical activity as tolerated  Essential hypertension-BP today 82/58 Maintain blood pressure log Increased hydration Increase sodium in diet Lower extremity support stockings  Hyperlipidemia-LDL 50 on 12/06/22. High-fiber diet Continue aspirin, atorvastatin   Peripheral arterial disease-denies claudication.  Reports compliance with apixaban.  Denies bleeding issues. Following with vascular surgery.   Preoperative cardiac evaluation-transurethral resection of bladder tumor, Dr. Legrand Rams, [facility], fax #[phone]    Primary Cardiologist: Julien Nordmann, MD  Chart reviewed as part of pre-operative protocol coverage. Given past medical history and time since last visit, based on ACC/AHA guidelines, Livvie Rowett would be at acceptable risk for the planned procedure without further cardiovascular testing.   Her RCRI is moderate risk, 6.6% risk of major cardiac event.  She is able to complete greater than 4 METS of physical activity.  Patient's apixaban and Plavix are not prescribed by cardiology.  Recommendations for holding antiplatelet and anticoagulant will need to come from prescribing provider.  Patient was advised that if she develops new symptoms prior to surgery to contact our office to arrange a follow-up appointment.  He verbalized understanding.  I will route this recommendation to the requesting party via Epic fax function and remove from pre-op pool.    Disposition: Follow-up with Dr. Mariah Milling, APP  or me in 9 months.   Thomasene Ripple. Antoniette Peake NP-C     02/15/2023, 2:27 PM [facility] [address] Office [phone] Fax [phone]    I spent 14 minutes examining this patient, reviewing medications, and using patient centered shared decision making involving their cardiac care.   I spent greater than 20 minutes reviewing their past medical history,  medications, and prior cardiac tests.

## 2023-02-15 ENCOUNTER — Encounter: Payer: Self-pay | Admitting: General Practice

## 2023-02-15 ENCOUNTER — Encounter
Admission: RE | Admit: 2023-02-15 | Discharge: 2023-02-15 | Disposition: A | Discharge status: Home / Self Care | Payer: Medicaid Other | Source: Ambulatory Visit | Attending: Urology | Admitting: Urology

## 2023-02-15 ENCOUNTER — Other Ambulatory Visit: Payer: Self-pay

## 2023-02-15 ENCOUNTER — Ambulatory Visit: Payer: Medicaid Other | Attending: General Practice | Admitting: General Practice

## 2023-02-15 VITALS — BP 82/58 | HR 81 | Ht 66.0 in | Wt 138.0 lb

## 2023-02-15 VITALS — Ht 66.0 in | Wt 138.0 lb

## 2023-02-15 DIAGNOSIS — Z01812 Encounter for preprocedural laboratory examination: Secondary | ICD-10-CM

## 2023-02-15 DIAGNOSIS — I5022 Chronic systolic (congestive) heart failure: Secondary | ICD-10-CM

## 2023-02-15 DIAGNOSIS — I255 Ischemic cardiomyopathy: Secondary | ICD-10-CM | POA: Diagnosis not present

## 2023-02-15 DIAGNOSIS — I2510 Atherosclerotic heart disease of native coronary artery without angina pectoris: Secondary | ICD-10-CM | POA: Diagnosis not present

## 2023-02-15 HISTORY — DX: Essential (primary) hypertension: I10

## 2023-02-15 NOTE — Patient Instructions (Addendum)
Medication Instructions:  The current medical regimen is effective;  continue present plan and medications as directed. Please refer to the Current Medication list given to you today.  *If you need a refill on your cardiac medications before your next appointment, please call your pharmacy*  Lab Work: NONE  Testing/Procedures: NONE  Other Instructions PLEASE PURCHASE AND WEAR COMPRESSION STOCKINGS-ATTACHED CONTINUE PHYSICAL THERAPY INCREASE THE SODIUM IN YOUR DIET-ATTACHED INCREASE HYDRATION  Follow-Up: At [facility], you and your health needs are our priority.  As part of our continuing mission to provide you with exceptional heart care, we have created designated Provider Care Teams.  These Care Teams include your primary Cardiologist (physician) and Advanced Practice Providers (APPs -  Physician Assistants and Nurse Practitioners) who all work together to provide you with the care you need, when you need it.  Your next appointment:   9 month(s)  Provider:   Julien Nordmann, MD       DASH Eating Plan DASH stands for Dietary Approaches to Stop Hypertension. The DASH eating plan is a healthy eating plan that has been shown to: Lower high blood pressure (hypertension). Reduce your risk for type 2 diabetes, heart disease, and stroke. Help with weight loss. What are tips for following this plan? Reading food labels Check food labels for the amount of salt (sodium) per serving. Choose foods with less than 5 percent of the Daily Value (DV) of sodium. In general, foods with less than 300 milligrams (mg) of sodium per serving fit into this eating plan. To find whole grains, look for the word "whole" as the first word in the ingredient list. Shopping Buy products labeled as "low-sodium" or "no salt added." Buy fresh foods. Avoid canned foods and pre-made or frozen meals. Cooking Try not to add salt when you cook. Use salt-free seasonings or herbs instead of table salt or sea  salt. Check with your health care provider or pharmacist before using salt substitutes. Do not fry foods. Cook foods in healthy ways, such as baking, boiling, grilling, roasting, or broiling. Cook using oils that are good for your heart. These include olive, canola, avocado, soybean, and sunflower oil. Meal planning  Eat a balanced diet. This should include: 4 or more servings of fruits and 4 or more servings of vegetables each day. Try to fill half of your plate with fruits and vegetables. 6-8 servings of whole grains each day. 6 or less servings of lean meat, poultry, or fish each day. 1 oz is 1 serving. A 3 oz (85 g) serving of meat is about the same size as the palm of your hand. One egg is 1 oz (28 g). 2-3 servings of low-fat dairy each day. One serving is 1 cup (237 mL). 1 serving of nuts, seeds, or beans 5 times each week. 2-3 servings of heart-healthy fats. Healthy fats called omega-3 fatty acids are found in foods such as walnuts, flaxseeds, fortified milks, and eggs. These fats are also found in cold-water fish, such as sardines, salmon, and mackerel. Limit how much you eat of: Canned or prepackaged foods. Food that is high in trans fat, such as fried foods. Food that is high in saturated fat, such as fatty meat. Desserts and other sweets, sugary drinks, and other foods with added sugar. Full-fat dairy products. Do not salt foods before eating. Do not eat more than 4 egg yolks a week. Try to eat at least 2 vegetarian meals a week. Eat more home-cooked food and less restaurant, buffet,  and fast food. Lifestyle When eating at a restaurant, ask if your food can be made with less salt or no salt. If you drink alcohol: Limit how much you have to: 0-1 drink a day if you are female. 0-2 drinks a day if you are female. Know how much alcohol is in your drink. In the [place] one drink is one 12 oz bottle of beer (355 mL), one 5 oz glass of wine (148 mL), or one 1 oz glass of hard liquor (44  mL). General information Avoid eating more than 2,300 mg of salt a day. If you have hypertension, you may need to reduce your sodium intake to 1,500 mg a day. Work with your provider to stay at a healthy body weight or lose weight. Ask what the best weight range is for you. On most days of the week, get at least 30 minutes of exercise that causes your heart to beat faster. This may include walking, swimming, or biking. Work with your provider or dietitian to adjust your eating plan to meet your specific calorie needs. What foods should I eat? Fruits All fresh, dried, or frozen fruit. Canned fruits that are in their natural juice and do not have sugar added to them. Vegetables Fresh or frozen vegetables that are raw, steamed, roasted, or grilled. Low-sodium or reduced-sodium tomato and vegetable juice. Low-sodium or reduced-sodium tomato sauce and tomato paste. Low-sodium or reduced-sodium canned vegetables. Grains Whole-grain or whole-wheat bread. Whole-grain or whole-wheat pasta. Brown rice. Orpah Cobb. Bulgur. Whole-grain and low-sodium cereals. Pita bread. Low-fat, low-sodium crackers. Whole-wheat flour tortillas. Meats and other proteins Skinless chicken or [place]. Ground chicken or [place]. Pork with fat trimmed off. Fish and seafood. Egg whites. Dried beans, peas, or lentils. Unsalted nuts, nut butters, and seeds. Unsalted canned beans. Lean cuts of beef with fat trimmed off. Low-sodium, lean precooked or cured meat, such as sausages or meat loaves. Dairy Low-fat (1%) or fat-free (skim) milk. Reduced-fat, low-fat, or fat-free cheeses. Nonfat, low-sodium ricotta or cottage cheese. Low-fat or nonfat yogurt. Low-fat, low-sodium cheese. Fats and oils Soft margarine without trans fats. Vegetable oil. Reduced-fat, low-fat, or light mayonnaise and salad dressings (reduced-sodium). Canola, safflower, olive, avocado, soybean, and sunflower oils. Avocado. Seasonings and condiments Herbs. Spices.  Seasoning mixes without salt. Other foods Unsalted popcorn and pretzels. Fat-free sweets. The items listed above may not be all the foods and drinks you can have. Talk to a dietitian to learn more. What foods should I avoid? Fruits Canned fruit in a light or heavy syrup. Fried fruit. Fruit in cream or butter sauce. Vegetables Creamed or fried vegetables. Vegetables in a cheese sauce. Regular canned vegetables that are not marked as low-sodium or reduced-sodium. Regular canned tomato sauce and paste that are not marked as low-sodium or reduced-sodium. Regular tomato and vegetable juices that are not marked as low-sodium or reduced-sodium. Rosita Fire. Olives. Grains Baked goods made with fat, such as croissants, muffins, or some breads. Dry pasta or rice meal packs. Meats and other proteins Fatty cuts of meat. Ribs. Fried meat. Tomasa Blase. Bologna, salami, and other precooked or cured meats, such as sausages or meat loaves, that are not lean and low in sodium. Fat from the back of a pig (fatback). Bratwurst. Salted nuts and seeds. Canned beans with added salt. Canned or smoked fish. Whole eggs or egg yolks. Chicken or [place] with skin. Dairy Whole or 2% milk, cream, and half-and-half. Whole or full-fat cream cheese. Whole-fat or sweetened yogurt. Full-fat cheese. Nondairy creamers. Whipped toppings.  Processed cheese and cheese spreads. Fats and oils Butter. Stick margarine. Lard. Shortening. Ghee. Bacon fat. Tropical oils, such as coconut, palm kernel, or palm oil. Seasonings and condiments Onion salt, garlic salt, seasoned salt, table salt, and sea salt. Worcestershire sauce. Tartar sauce. Barbecue sauce. Teriyaki sauce. Soy sauce, including reduced-sodium soy sauce. Steak sauce. Canned and packaged gravies. Fish sauce. Oyster sauce. Cocktail sauce. Store-bought horseradish. Ketchup. Mustard. Meat flavorings and tenderizers. Bouillon cubes. Hot sauces. Pre-made or packaged marinades. Pre-made or packaged  taco seasonings. Relishes. Regular salad dressings. Other foods Salted popcorn and pretzels. The items listed above may not be all the foods and drinks you should avoid. Talk to a dietitian to learn more. Where to find more information National Heart, Lung, and Blood Institute (NHLBI): BuffaloDryCleaner.gl American Heart Association (AHA): heart.org Academy of Nutrition and Dietetics: eatright.org National Kidney Foundation (NKF): kidney.org This information is not intended to replace advice given to you by your health care provider. Make sure you discuss any questions you have with your health care provider. Document Revised: 01/28/2022 Document Reviewed: 01/28/2022 Elsevier Patient Education  2024 Karolee Ohs

## 2023-02-15 NOTE — Patient Instructions (Addendum)
Your procedure is scheduled on: Friday, January 24 Report to the Registration Desk on the 1st floor of the [facility] To find out your arrival time, please call [phone] between 1PM - 3PM on: Thursday, January 23 If your arrival time is 6:00 am, do not arrive before that time as the [facility] entrance doors do not open until 6:00 am.  REMEMBER: Instructions that are not followed completely may result in serious medical risk, up to and including death; or upon the discretion of your surgeon and anesthesiologist your surgery may need to be rescheduled.  Do not eat or drink after midnight the night before surgery.  No gum chewing or hard candies.  One week prior to surgery:  Stop Anti-inflammatories (NSAIDS) such as Advil, Aleve, Ibuprofen, Motrin, Naproxen, Naprosyn and Aspirin based products such as Excedrin, Goody's Powder, BC Powder. Stop ANY OVER THE COUNTER supplements until after surgery. Stop vitamin C, tums, multiple vitamins, zinc.  You may however, continue to take Tylenol if needed for pain up until the day of surgery.  Per Dr. Wyn Quaker and Dr. Richardo Hanks:  clopidogrel (PLAVIX) - hold for 5 days before surgery. Last day to take is Saturday, January 18. Resume AFTER surgery per surgeon instruction.  apixaban (ELIQUIS) - hold for 2 days before surgery. Last day to take is Tuesday, January 21. Resume AFTER surgery per surgeon instruction.  aspirin EC 81 MG - continue taking.  Lantus - only take half of your regular dose the night before surgery. Only take 10 units Lantus on Thursday night, January 23.  Do NOT take ANY insulin on the morning of surgery.  Continue taking all of your other prescription medications up until the day of surgery.  ON THE DAY OF SURGERY ONLY TAKE THESE MEDICATIONS WITH SIPS OF WATER:  atorvastatin (LIPITOR)  nitrofurantoin, macrocrystal-monohydrate, (MACROBID)  pantoprazole (PROTONIX)  SPIRIVA HANDIHALER  traMADol (ULTRAM) if needed for  pain  Use inhalers on the day of surgery and bring your albuterol inhaler to the hospital.  No Alcohol for 24 hours before or after surgery.  No Smoking including e-cigarettes for 24 hours before surgery.  No chewable tobacco products for at least 6 hours before surgery.  No nicotine patches on the day of surgery.  Do not use any "recreational" drugs for at least a week (preferably 2 weeks) before your surgery.  Please be advised that the combination of cocaine and anesthesia may have negative outcomes, up to and including death. If you test positive for cocaine, your surgery will be cancelled.  On the morning of surgery brush your teeth with toothpaste and water, you may rinse your mouth with mouthwash if you wish. Do not swallow any toothpaste or mouthwash.  Do not wear jewelry, make-up, hairpins, clips or nail polish.  For welded (permanent) jewelry: bracelets, anklets, waist bands, etc.  Please have this removed prior to surgery.  If it is not removed, there is a chance that hospital personnel will need to cut it off on the day of surgery.  Do not wear lotions, powders, or perfumes.   Do not shave body hair from the neck down 48 hours before surgery.  Contact lenses, hearing aids and dentures may not be worn into surgery.  Do not bring valuables to the hospital. [facility] is not responsible for any missing/lost belongings or valuables.   Notify your doctor if there is any change in your medical condition (cold, fever, infection).  Wear comfortable clothing (specific to your surgery type) to  the hospital.  After surgery, you can help prevent lung complications by doing breathing exercises.  Take deep breaths and cough every 1-2 hours.   If you are being discharged the day of surgery, you will not be allowed to drive home. You will need a responsible individual to drive you home and stay with you for 24 hours after surgery.   If you are taking public transportation, you  will need to have a responsible individual with you.  Please call the Pre-admissions Testing Dept. at [phone] if you have any questions about these instructions.  Surgery Visitation Policy:  Patients having surgery or a procedure may have two visitors.  Children under the age of 1 must have an adult with them who is not the patient.  Temporary Visitor Restrictions Due to increasing cases of flu, RSV and COVID-19: Children ages 65 and under will not be able to visit patients in [facility] hospitals under most circumstances.

## 2023-02-17 ENCOUNTER — Encounter: Payer: Self-pay | Admitting: Urology

## 2023-02-17 NOTE — Progress Notes (Signed)
Perioperative / Anesthesia Services  Pre-Admission Testing Clinical Review / Pre-Operative Anesthesia Consult  Date: 02/17/23  Patient Demographics:  Name: Beth Carroll DOB: [birth year] MRN:   [id number]  Planned Surgical Procedure(s):    Case: [id number] Date/Time: 02/18/23 1235   Procedures:      TRANSURETHRAL RESECTION OF BLADDER TUMOR (TURBT)     CYSTOSCOPY WITH RETROGRADE PYELOGRAM (Bilateral)     BLADDER INSTILLATION OF GEMCITABINE   Anesthesia type: General   Pre-op diagnosis: Bladder Tumor   Location: [facility] OR ROOM 10 / [facility]   Surgeons: Sondra Come, MD      NOTE: Available PAT nursing documentation and vital signs have been reviewed. Clinical nursing staff has updated patient's PMH/PSHx, current medication list, and drug allergies/intolerances to ensure comprehensive history available to assist in medical decision making as it pertains to the aforementioned surgical procedure and anticipated anesthetic course. Extensive review of available clinical information personally performed.  PMH and PSHx updated with any diagnoses/procedures that  may have been inadvertently omitted during his intake with the pre-admission testing department's nursing staff.  Clinical Discussion:  Beth Carroll is a 53 y.o. female who is submitted for pre-surgical anesthesia review and clearance prior to her undergoing the above procedure. Patient is a Former Smoker (quit 11/2020). Pertinent PMH includes: CAD, anterior MI, ischemic cardiomyopathy, HFrEF, atrial fibrillation, PAD, aortic atherosclerosis, DVT, HTN, HLD, T2DM with prior HAGMA, COPD, GERD (on daily PPI), bladder tumor, ADHD.  Patient is followed by cardiology Beth Milling, MD). She was last seen in the cardiology clinic on 02/15/2023; notes reviewed. At the time of her clinic visit, patient doing well overall from a cardiovascular perspective.  Her main complaint was episodes of hypotension.  Patient denied any  chest pain, shortness of breath, PND, orthopnea, palpitations, significant peripheral edema, weakness, fatigue, vertiginous symptoms, or presyncope/syncope. Patient with a past medical history significant for cardiovascular diagnoses. Documented physical exam was grossly benign, providing no evidence of acute exacerbation and/or decompensation of the patient's known cardiovascular conditions.  Patient reported to have suffered an anterior wall MI in the past.  ECG during admission for ischemic cardiomyopathy with resulting HFrEF showed evidence of an old anterior infarct.  Troponins at that time were trended: 28 --> 27 --> 43 --> 42 ng/L.  Patient ultimately underwent diagnostic LEFT heart catheterization on 12/15/2020 revealing moderately reduced left ventricular systolic function with an EF of 35-45%.  There was multivessel CAD noted; 99% mid LAD, 90% RI, 40% mid-distal LAD.  Patient subsequently underwent PCI placing a 2.25 x 18 mm Onyx Frontier to the mid LAD and a 2.75 x 22 mm Onyx Frontier DES to the ramus intermedius lesion.  Procedure yielded excellent angiographic result and TIMI-3 flow.  Patient with a history of significant PVD resulting in multiple peripheral vascular interventions  Status post vascular intervention on 12/10/2020.  Underwent PTA of the LEFT anterior tibial and posterior tibial arteries.  Mechanical thrombectomy of the LEFT SFA and popliteal artery followed by PTA placing a 6 mm x 25 cm Viabahn stent to the LEFT SFA and a 6 mm x 7.5 cm Viabahn stent to the above-the-knee popliteal artery.  Status post vascular intervention on 12/25/2020.  Mechanical thrombectomy of the RIGHT SFA and proximal popliteal artery.  PTA of the RIGHT SFA and above the knee popliteal artery performed.  6 mm x 25 Solum manner Viabahn stent placed to the RIGHT SFA and a 6 mm x 10 cm Viabahn stent placed to the RIGHT popliteal  artery.  Status post vascular intervention on 08/19/2021.  Patient received  catheter directed TPA (12 mg) to the right SFA, popliteal artery, tibioperoneal trunk, and proximal posterior tibial artery.  Catheter left in place for continuous thrombolytic therapy.  Mechanical thrombectomy of the RIGHT SFA and popliteal arteries performed.  PTA of the RIGHT SFA and popliteal arteries performed.  Later that day, patient underwent mechanical thrombectomy of the RIGHT SFA, popliteal artery, tibioperoneal trunk, and proximal posterior tibial artery.  5 mm x 7.5 cm Viabahn stent placed to the RIGHT popliteal artery.  PTA of the RIGHT posterior tibial artery performed.  6 mm x 2.5 cm Viabahn stent placed to the RIGHT SFA.  Status post vascular intervention on 04/24/2022.  Aspiration thrombectomy of the RIGHT common femoral, superficial femoral distribution, popliteal, and RIGHT deep femoral origin performed.  PTA of the RIGHT SFA origin, common femoral artery, and RIGHT deep femoral origin performed.  Given her ischemic cardiomyopathy and resulting HFrEF, patient's cardiac function routinely monitored via echocardiogram.  Most recent TTE was performed on 12/06/2022 revealing a low normal left ventricular systolic function with an EF of 50-55%.  There was akinesis of the mid apical anteroseptal wall, anterior segment, and apical segment.  Diastolic Doppler parameters were normal.  Right ventricular size and function normal.  There was trivial mitral valve regurgitation. All transvalvular gradients were noted to be normal providing no evidence suggestive of valvular stenosis. Aorta normal in size with no evidence of ectasia or aneurysmal dilatation.  Most recent myocardial perfusion imaging study was performed on 12/06/2022 revealing a low normal left ventricular systolic function with an EF of 51%.  There were no regional wall motion abnormalities.  No artifact or left ventricular cavity size enlargement appreciated on review of imaging.  SPECT images demonstrated large fixed apical to mid  anterior, anteroseptal, and apex areas of hypoperfusion consistent with infarction.  There was no evidence of stress-induced myocardial arrhythmia.  Study consistent with transmural infarct in the mid to distal LAD distribution with no significant ischemia.  Patient with an atrial fibrillation diagnosis; CHA2DS2-VASc Score = 7 (sex, HFrEF, HTN, DVT x 2, prior MI, T2DM).cardiac rate and rhythm maintained intrinsically without the need for pharmacological intervention.  Patient is also on antithrombotic therapy using clopidogrel for added management of her severe PAD.  She is chronically anticoagulated using standard dose apixaban; reported to be compliant with therapy with no evidence or reports of GI/GU bleeding. Blood pressure found to be low in the cardiology clinic at 76/50 mmHg with recheck of 82/58 mmHg.  Patient is on atorvastatin for her HLD diagnosis and ASCVD prevention. T2DM loosely controlled on currently prescribed regimen; last HgbA1c was 8.6% when checked on 12/05/2022. She does not have an OSAH diagnosis. Patient is able to complete all of her  ADL/IADLs without cardiovascular limitation.  Per the [facility], patient is able to achieve at least 4 METS of physical activity without experiencing any significant degree of angina/anginal equivalent symptoms. No changes were made to her medication regimen during her visit with cardiology.  Patient scheduled to follow-up with outpatient cardiology in 6-9 months or sooner if needed.  Beth Carroll is scheduled for an elective TRANSURETHRAL RESECTION OF BLADDER TUMOR (TURBT); CYSTOSCOPY WITH RETROGRADE PYELOGRAM (Bilateral); BLADDER INSTILLATION OF GEMCITABINE on 02/18/2023 with Dr. Legrand Rams, MD.  Given patient's past medical history significant for cardiovascular diagnoses, presurgical cardiac clearance was sought by the PAT team.  Per cardiology, "RCRI is moderate risk, 6.6% risk of major cardiac event. She is  able to complete greater than 4 METS of  physical activity. Given past medical history and time since last visit, based on ACC/AHA guidelines, Beth Carroll would be at ACCEPTABLE risk for the planned procedure without further cardiovascular testing".   Again, this patient is on daily oral anticoagulation therapy using a DOAC (apixaban) medication in addition to daily DAPT therapy using ASA and clopidogrel. She has been instructed on recommendations for holding her clopidogrel for 5 days (last dose 02/12/2023) and her apixaban for 2 days (last dose 02/15/2023) prior to her procedure with plans to restart as soon as postoperative bleeding risk felt to be minimized by his attending surgeon.  Given her significant cardiovascular history, patient will continue her daily low-dose ASA throughout the perioperative course.  Patient denies previous perioperative complications with anesthesia in the past. In review her EMR, it is noted that patient underwent a general anesthetic course here at [facility] (ASA III) in 06/2022 without documented complications.      02/15/2023    2:14 PM 02/15/2023    1:48 PM 02/15/2023   10:18 AM  Vitals with BMI  Height  5\' 6"  5\' 6"   Weight  138 lbs 138 lbs  BMI  22.28 22.28  Systolic 82 76   Diastolic 58 50   Pulse  81    Providers/Specialists:  NOTE: Primary physician provider listed below. Patient may have been seen by APP or partner within same practice.   PROVIDER ROLE / SPECIALTY LAST Lise Auer, MD Urology (Surgeon) 02/08/2023  Gracelyn Nurse, MD Primary Care Provider 12/13/2022  Julien Nordmann, MD Cardiology 02/15/2023  Wendall Mola, MD Endocrinology 01/11/2023  Festus Barren, MD Vascular Surgery 06/25/2022   Allergies:  No Known Allergies Current Home Medications:   No current facility-administered medications for this encounter.    acetaminophen (TYLENOL) 650 MG CR tablet   albuterol (VENTOLIN HFA) 108 (90 Base) MCG/ACT inhaler   apixaban  (ELIQUIS) 5 MG TABS tablet   ascorbic acid (VITAMIN C) 500 MG tablet   aspirin EC 81 MG tablet   atorvastatin (LIPITOR) 80 MG tablet   calcium carbonate (TUMS - DOSED IN MG ELEMENTAL CALCIUM) 500 MG chewable tablet   clopidogrel (PLAVIX) 75 MG tablet   estradiol (ESTRACE) 0.1 MG/GM vaginal cream   Insulin Aspart FlexPen (NOVOLOG) 100 UNIT/ML   insulin glargine (LANTUS SOLOSTAR) 100 UNIT/ML Solostar Pen   Multiple Vitamins-Minerals (CENTRUM SILVER 50+WOMEN PO)   nitrofurantoin, macrocrystal-monohydrate, (MACROBID) 100 MG capsule   pantoprazole (PROTONIX) 40 MG tablet   SPIRIVA HANDIHALER 18 MCG inhalation capsule   traMADol (ULTRAM) 50 MG tablet   zinc sulfate 220 (50 Zn) MG capsule   B-D UF III MINI PEN NEEDLES 31G X 5 MM MISC   Continuous Glucose Sensor (FREESTYLE LIBRE 3 SENSOR) MISC   History:   Past Medical History:  Diagnosis Date   Acute deep vein thrombosis (DVT) of right lower extremity (HCC)    ADHD (attention deficit hyperactivity disorder)    AKI (acute kidney injury) (HCC) 04/30/2022   Anterior myocardial infarction [facility])    Aortic atherosclerosis (HCC)    Atrial fibrillation (HCC) 06/2022   a.) CHA2DS2-VASc = 7 (sex, HFrEF, HTN, DVT x2, prior MI, T2DM) as of 02/17/2023; b.) cardiac rate/rhythm maintained intrinsically without pharmacological intervention; chronically anticoagulated using apixaban   Bladder tumor    COPD (chronic obstructive pulmonary disease) (HCC)    Coronary artery disease 12/15/2020   a.) LHC/PCI 12/15/2020: 99% mLAD (2.25 x  18 mm Onyx Frontier DES), 90% RI (2.75 x 22 mm Onyx Frontier DES), 40% m-dLAD; b.) MV 12/06/2022: EF 51%, evidence of transmural infarct in m-dLAD territory with no sig ischemia   Diabetes mellitus, type 2 (HCC)    Essential hypertension    GERD (gastroesophageal reflux disease)    HFrEF (heart failure with reduced ejection fraction) (HCC)    a.) TTE 12/10/2020: EF 40-45%, apical/periapical HK, G2DD; b.) LHC 12/15/2020: EF  35-45%; c.) TTE 06/26/2021: EF 40-45%, LVH, G2DD; d.) TTE 03/21/2022: EF 55-60%, LV/apical sep/ant wall HK, Triv MR, AoV sclerosis; e.) <V 12/06/2022: EF 50-55%, mid-apical anterosep wall/anterior seg/apical seg AK, triv MR   High anion gap metabolic acidosis 04/30/2022   Hyperlipemia    Hypophosphatemia 04/30/2022   Ischemic cardiomyopathy    a.) TTE 12/10/2020: EF 40-45%; b.) LHC 12/15/2020: EF 35-45%; c.) TTE 06/26/2021: EF 40-45%; d.) TTE 03/21/2022: EF 55-60%; e.) TTE 12/06/2022: 50-55%; f.) MV 12/06/2022: 51%   Long term current use of aspirin    Long term current use of clopidogrel    Meningioma (HCC)    a.) CT head and MRI brain 08/21/2021: LEFT parietal meningioma   Nausea vomiting and diarrhea 04/30/2022   On apixaban therapy    Orthostatic hypotension    PAD (peripheral artery disease) (HCC)    a.) multiple PTA with stents and thrombectomies   Sepsis secondary to UTI (HCC) 04/30/2022   Tobacco use    Past Surgical History:  Procedure Laterality Date   AMPUTATION Right 06/03/2022   Procedure: AMPUTATION RAY ( 1ST AND 2ND TOE);  Surgeon: Annice Needy, MD;  Location: [facility] ORS;  Service: Vascular;  Laterality: Right;   CHOLECYSTECTOMY     CORONARY STENT INTERVENTION N/A 12/15/2020   Procedure: CORONARY STENT INTERVENTION;  Surgeon: Iran Ouch, MD;  Location: [facility] INVASIVE CV LAB;  Service: Cardiovascular;  Laterality: N/A;   INCISION AND DRAINAGE OF WOUND Right 07/09/2022   Procedure: IRRIGATION AND DEBRIDEMENT WOUND;  Surgeon: Linus Galas, DPM;  Location: [facility] ORS;  Service: Podiatry;  Laterality: Right;   LEFT HEART CATH AND CORONARY ANGIOGRAPHY N/A 12/15/2020   Procedure: LEFT HEART CATH AND CORONARY ANGIOGRAPHY;  Surgeon: Iran Ouch, MD;  Location: [facility] INVASIVE CV LAB;  Service: Cardiovascular;  Laterality: N/A;   LOWER EXTREMITY ANGIOGRAPHY Left 12/10/2020   Procedure: Lower Extremity Angiography;  Surgeon: Annice Needy, MD;  Location: [facility] INVASIVE CV LAB;   Service: Cardiovascular;  Laterality: Left;   LOWER EXTREMITY ANGIOGRAPHY Right 12/25/2020   Procedure: LOWER EXTREMITY ANGIOGRAPHY;  Surgeon: Annice Needy, MD;  Location: [facility] INVASIVE CV LAB;  Service: Cardiovascular;  Laterality: Right;   LOWER EXTREMITY ANGIOGRAPHY Right 08/19/2021   Procedure: Lower Extremity Angiography;  Surgeon: Annice Needy, MD;  Location: [facility] INVASIVE CV LAB;  Service: Cardiovascular;  Laterality: Right;   LOWER EXTREMITY ANGIOGRAPHY Right 08/19/2021   Procedure: Lower Extremity Angiography;  Surgeon: Annice Needy, MD;  Location: [facility] INVASIVE CV LAB;  Service: Cardiovascular;  Laterality: Right;   LOWER EXTREMITY ANGIOGRAPHY Right 04/24/2022   Procedure: Lower Extremity Angiography;  Surgeon: Learta Codding, MD;  Location: [facility] INVASIVE CV LAB;  Service: Cardiovascular;  Laterality: Right;   ORIF FEMUR FRACTURE Right 12/14/2021   Procedure: OPEN REDUCTION INTERNAL FIXATION RIGHT DISTAL FEMUR;  Surgeon: Roby Lofts, MD;  Location: [facility] OR;  Service: Orthopedics;  Laterality: Right;   Family History  Problem Relation Age of Onset   Hypertension Father    Social History   Tobacco  Use   Smoking status: Former    Current packs/day: 0.00    Types: Cigarettes    Quit date: 12/09/2020    Years since quitting: 2.1    Passive exposure: Never   Smokeless tobacco: Never  Substance Use Topics   Alcohol use: Not Currently   Pertinent Clinical Results:  LABS:  Lab Results  Component Value Date   WBC 4.2 12/05/2022   HGB 11.3 (L) 12/05/2022   HCT 36.5 12/05/2022   MCV 88.6 12/05/2022   PLT 262 12/05/2022   Lab Results  Component Value Date   NA 136 12/05/2022   K 4.0 12/05/2022   CO2 28 12/05/2022   GLUCOSE 285 (H) 12/05/2022   BUN 15 12/05/2022   CREATININE 0.53 12/05/2022   CALCIUM 9.0 12/05/2022   EGFR 107 03/18/2021   GFRNONAA >60 12/05/2022   Hospital Outpatient Visit on 02/08/2023  Component Date Value Ref Range Status   Color, Urine  02/08/2023 AMBER (A)  YELLOW Final   BIOCHEMICALS MAY BE AFFECTED BY COLOR   APPearance 02/08/2023 HAZY (A)  CLEAR Final   Specific Gravity, Urine 02/08/2023 1.015  1.005 - 1.030 Final   pH 02/08/2023 6.0  5.0 - 8.0 Final   Glucose, UA 02/08/2023 100 (A)  NEGATIVE mg/dL Final   Hgb urine dipstick 02/08/2023 LARGE (A)  NEGATIVE Final   Bilirubin Urine 02/08/2023 NEGATIVE  NEGATIVE Final   Ketones, ur 02/08/2023 NEGATIVE  NEGATIVE mg/dL Final   Protein, ur 16/10/9602 TRACE (A)  NEGATIVE mg/dL Final   Nitrite 54/09/8117 NEGATIVE  NEGATIVE Final   Leukocytes,Ua 02/08/2023 TRACE (A)  NEGATIVE Final   Squamous Epithelial / HPF 02/08/2023 6-10  0 - 5 /HPF Final   WBC, UA 02/08/2023 6-10  0 - 5 WBC/hpf Final   RBC / HPF 02/08/2023 >50  0 - 5 RBC/hpf Final   Bacteria, UA 02/08/2023 FEW (A)  NONE SEEN Final   Performed at [facility], [address] [place], [place] [address]   Specimen Description 02/08/2023    Final                   Value:URINE, CLEAN CATCH Performed at [facility], [address] [place], [place] [address]    Special Requests 02/08/2023    Final                   Value:NONE Performed at [facility], [address], [place], [place] [address]    Culture 02/08/2023    Final                   Value:NO GROWTH Performed at [facility], [address] [place], [place] [address]    Report Status 02/08/2023 02/10/2023 FINAL   Final    ECG: Date: 02/15/2023  Time ECG obtained: 1346 PM Rate: 81 bpm Rhythm: normal sinus Axis (leads I and aVF): left Intervals: PR 164 ms. QRS 90 ms. QTc 466 ms. ST segment and T wave changes: No evidence of acute T wave abnormalities or significant ST segment elevation or depression. Comparison: Similar to previous tracing obtained on 12/05/2022   IMAGING / PROCEDURES: MYOCARDIAL PERFUSION IMAGING STUDY (LEXISCAN) performed on 12/06/2022 Left ventricular function is abnormal. Global function  is mildly reduced. Nuclear stress EF: 51%. End diastolic cavity size is normal. End systolic cavity size is normal. LV perfusion is abnormal.  There is no evidence of ischemia.  There is evidence of infarction.  Defect 1: There is a large defect with absent uptake present in the apical to mid anterior, anteroseptal and apex location(s) that is fixed. There is abnormal wall motion in the defect area consistent with infarction.  No ST deviation was noted.  IMPRESSION: There is evidence of transmural infarct in the mid to distal LAD distribution with no significant ischemia.   TRANSTHORACIC ECHOCARDIOGRAM performed on 12/06/2022 Left ventricular ejection fraction, by estimation, is 50 to 55%. The left ventricle has low normal function. The left ventricle demonstrates regional wall motion abnormalities. Left ventricular diastolic parameters were normal. There is akinesis of the left ventricular, mid-apical anteroseptal wall, anterior segment and apical segment.  Right ventricular systolic function is normal. The right ventricular size is normal.  The mitral valve is normal in structure. Trivial mitral valve regurgitation. No evidence of mitral stenosis.  The aortic valve is normal in structure. Aortic valve regurgitation is not visualized. No aortic stenosis is present.  The inferior vena cava is normal in size with greater than 50% respiratory variability, suggesting right atrial pressure of 3 mmHg.   LEFT HEART CATHETERIZATION AND CORONARY ANGIOGRAPHY performed on 12/15/2020 Severe two-vessel coronary artery disease with subtotal occlusion of the mid LAD as well as significant stenosis in proximal ramus.  The LAD is diffusely diseased and small in size in the mid to distal segment. Moderately reduced LV systolic function with akinesis of mid to distal anterior and apical myocardium.  Normal left ventricular end-diastolic pressure. Successful angioplasty and drug-eluting stent placement to the mid LAD as  well as proximal ramus.    Impression and Plan:  Merrilie Ridpath has been referred for pre-anesthesia review and clearance prior to her undergoing the planned anesthetic and procedural courses. Available labs, pertinent testing, and imaging results were personally reviewed by me in preparation for upcoming operative/procedural course. [facility] medical record has been updated following extensive record review and patient interview with PAT staff.   This patient has been appropriately cleared by cardiology with an overall ACCEPTABLE risk of experiencing significant perioperative cardiovascular complications. Based on clinical review performed today (02/17/23), barring any significant acute changes in the patient's overall condition, it is anticipated that she will be able to proceed with the planned surgical intervention. Any acute changes in clinical condition may necessitate her procedure being postponed and/or cancelled. Patient will meet with anesthesia team (MD and/or CRNA) on the day of her procedure for preoperative evaluation/assessment. Questions regarding anesthetic course will be fielded at that time.   Pre-surgical instructions were reviewed with the patient during his PAT appointment, and questions were fielded to satisfaction by PAT clinical staff. She has been instructed on which medications that she will need to hold prior to surgery, as well as the ones that have been deemed safe/appropriate to take on the day of his procedure. As part of the general education provided by PAT, patient made aware both verbally and in writing, that she would need to abstain from the use of any illegal substances during his perioperative course. She was advised that failure to follow the provided instructions could necessitate case cancellation or result in serious perioperative complications up to and including death. Patient encouraged to contact PAT and/or her surgeon's office to discuss any questions or  concerns that may arise prior to surgery; verbalized understanding.   Quentin Mulling, MSN, APRN, FNP-C, CEN [facility]  Perioperative Services Nurse Practitioner Phone: [phone] Fax: [phone] 02/17/23 2:02 PM  NOTE: This note has been prepared using  Scientist, clinical (histocompatibility and immunogenetics). Despite my best ability to proofread, there is always the potential that unintentional transcriptional errors may still occur from this process.

## 2023-02-18 ENCOUNTER — Encounter
Admission: RE | Disposition: A | Discharge status: Home / Self Care | Payer: Self-pay | Source: Home / Self Care | Attending: Urology

## 2023-02-18 ENCOUNTER — Ambulatory Visit: Payer: Medicaid Other

## 2023-02-18 ENCOUNTER — Other Ambulatory Visit: Payer: Self-pay | Admitting: Urology

## 2023-02-18 ENCOUNTER — Ambulatory Visit
Admission: RE | Admit: 2023-02-18 | Discharge: 2023-02-18 | Disposition: A | Discharge status: Home / Self Care | Payer: Medicaid Other | Attending: Urology | Admitting: Urology

## 2023-02-18 ENCOUNTER — Ambulatory Visit: Payer: Medicaid Other | Admitting: Urgent Care

## 2023-02-18 ENCOUNTER — Encounter: Payer: Self-pay | Admitting: Urology

## 2023-02-18 ENCOUNTER — Other Ambulatory Visit: Payer: Self-pay

## 2023-02-18 DIAGNOSIS — D494 Neoplasm of unspecified behavior of bladder: Secondary | ICD-10-CM | POA: Diagnosis present

## 2023-02-18 DIAGNOSIS — I252 Old myocardial infarction: Secondary | ICD-10-CM | POA: Diagnosis not present

## 2023-02-18 DIAGNOSIS — J449 Chronic obstructive pulmonary disease, unspecified: Secondary | ICD-10-CM | POA: Diagnosis not present

## 2023-02-18 DIAGNOSIS — I2510 Atherosclerotic heart disease of native coronary artery without angina pectoris: Secondary | ICD-10-CM | POA: Diagnosis not present

## 2023-02-18 DIAGNOSIS — I5022 Chronic systolic (congestive) heart failure: Secondary | ICD-10-CM | POA: Insufficient documentation

## 2023-02-18 DIAGNOSIS — Z955 Presence of coronary angioplasty implant and graft: Secondary | ICD-10-CM | POA: Insufficient documentation

## 2023-02-18 DIAGNOSIS — Z87891 Personal history of nicotine dependence: Secondary | ICD-10-CM | POA: Diagnosis not present

## 2023-02-18 DIAGNOSIS — D303 Benign neoplasm of bladder: Secondary | ICD-10-CM | POA: Diagnosis not present

## 2023-02-18 DIAGNOSIS — E1151 Type 2 diabetes mellitus with diabetic peripheral angiopathy without gangrene: Secondary | ICD-10-CM | POA: Diagnosis not present

## 2023-02-18 DIAGNOSIS — Z8249 Family history of ischemic heart disease and other diseases of the circulatory system: Secondary | ICD-10-CM | POA: Diagnosis not present

## 2023-02-18 DIAGNOSIS — R310 Gross hematuria: Secondary | ICD-10-CM | POA: Insufficient documentation

## 2023-02-18 DIAGNOSIS — I110 Hypertensive heart disease with heart failure: Secondary | ICD-10-CM | POA: Diagnosis not present

## 2023-02-18 DIAGNOSIS — Z01812 Encounter for preprocedural laboratory examination: Secondary | ICD-10-CM

## 2023-02-18 DIAGNOSIS — I255 Ischemic cardiomyopathy: Secondary | ICD-10-CM | POA: Insufficient documentation

## 2023-02-18 HISTORY — DX: Long term (current) use of aspirin: Z79.82

## 2023-02-18 HISTORY — DX: ST elevation (STEMI) myocardial infarction involving other coronary artery of anterior wall: I21.09

## 2023-02-18 HISTORY — DX: Long term (current) use of anticoagulants: Z79.01

## 2023-02-18 HISTORY — PX: FULGURATION OF BLADDER TUMOR: SHX6261

## 2023-02-18 HISTORY — DX: Neoplasm of unspecified behavior of bladder: D49.4

## 2023-02-18 HISTORY — DX: Long term (current) use of antithrombotics/antiplatelets: Z79.02

## 2023-02-18 HISTORY — PX: CYSTOSCOPY W/ RETROGRADES: SHX1426

## 2023-02-18 LAB — GLUCOSE, CAPILLARY
Glucose-Capillary: 335 mg/dL — ABNORMAL HIGH (ref 70–99)
Glucose-Capillary: 349 mg/dL — ABNORMAL HIGH (ref 70–99)

## 2023-02-18 SURGERY — CYSTOSCOPY, WITH RETROGRADE PYELOGRAM
Anesthesia: General | Laterality: Bilateral

## 2023-02-18 MED ORDER — ONDANSETRON HCL 4 MG/2ML IJ SOLN
INTRAMUSCULAR | Status: AC
  Filled 2023-02-18: qty 2

## 2023-02-18 MED ORDER — PHENYLEPHRINE 80 MCG/ML (10ML) SYRINGE FOR IV PUSH (FOR BLOOD PRESSURE SUPPORT)
PREFILLED_SYRINGE | INTRAVENOUS | Status: DC | PRN
  Administered 2023-02-18 (×2): 160 ug via INTRAVENOUS

## 2023-02-18 MED ORDER — ACETAMINOPHEN 10 MG/ML IV SOLN
INTRAVENOUS | Status: AC
  Filled 2023-02-18: qty 100

## 2023-02-18 MED ORDER — GENTAMICIN SULFATE 40 MG/ML IJ SOLN
5.0000 mg/kg | INTRAVENOUS | Status: AC
  Administered 2023-02-18: 320 mg via INTRAVENOUS
  Filled 2023-02-18: qty 8

## 2023-02-18 MED ORDER — LIDOCAINE HCL (CARDIAC) PF 100 MG/5ML IV SOSY
PREFILLED_SYRINGE | INTRAVENOUS | Status: DC | PRN
  Administered 2023-02-18: 100 mg via INTRAVENOUS

## 2023-02-18 MED ORDER — FENTANYL CITRATE (PF) 100 MCG/2ML IJ SOLN: 25.0000 ug | INTRAMUSCULAR | Status: DC | PRN

## 2023-02-18 MED ORDER — SUCCINYLCHOLINE CHLORIDE 200 MG/10ML IV SOSY
PREFILLED_SYRINGE | INTRAVENOUS | Status: DC | PRN
  Administered 2023-02-18: 100 mg via INTRAVENOUS

## 2023-02-18 MED ORDER — PHENYLEPHRINE HCL-NACL 20-0.9 MG/250ML-% IV SOLN
INTRAVENOUS | Status: AC
  Filled 2023-02-18: qty 250

## 2023-02-18 MED ORDER — FENTANYL CITRATE (PF) 100 MCG/2ML IJ SOLN
INTRAMUSCULAR | Status: DC | PRN
  Administered 2023-02-18: 50 ug via INTRAVENOUS

## 2023-02-18 MED ORDER — SUCCINYLCHOLINE CHLORIDE 200 MG/10ML IV SOSY
PREFILLED_SYRINGE | INTRAVENOUS | Status: AC
  Filled 2023-02-18: qty 10

## 2023-02-18 MED ORDER — LIDOCAINE HCL (PF) 2 % IJ SOLN
INTRAMUSCULAR | Status: AC
  Filled 2023-02-18: qty 5

## 2023-02-18 MED ORDER — PROPOFOL 10 MG/ML IV BOLUS
INTRAVENOUS | Status: AC
  Filled 2023-02-18: qty 20

## 2023-02-18 MED ORDER — ACETAMINOPHEN 10 MG/ML IV SOLN
INTRAVENOUS | Status: DC | PRN
  Administered 2023-02-18: 1000 mg via INTRAVENOUS

## 2023-02-18 MED ORDER — SODIUM CHLORIDE 0.9 % IV SOLN: INTRAVENOUS | Status: DC

## 2023-02-18 MED ORDER — SEVOFLURANE IN SOLN
RESPIRATORY_TRACT | Status: AC
  Filled 2023-02-18: qty 250

## 2023-02-18 MED ORDER — DROPERIDOL 2.5 MG/ML IJ SOLN
0.6250 mg | Freq: Once | INTRAMUSCULAR | Status: DC | PRN
Start: 2023-02-18 — End: 2023-02-18

## 2023-02-18 MED ORDER — FENTANYL CITRATE (PF) 100 MCG/2ML IJ SOLN
INTRAMUSCULAR | Status: AC
  Filled 2023-02-18: qty 2

## 2023-02-18 MED ORDER — IOHEXOL 180 MG/ML  SOLN
INTRAMUSCULAR | Status: DC | PRN
  Administered 2023-02-18: 20 mL

## 2023-02-18 MED ORDER — CHLORHEXIDINE GLUCONATE 0.12 % MT SOLN
15.0000 mL | Freq: Once | OROMUCOSAL | Status: AC
  Administered 2023-02-18: 15 mL via OROMUCOSAL

## 2023-02-18 MED ORDER — ORAL CARE MOUTH RINSE
15.0000 mL | Freq: Once | OROMUCOSAL | Status: AC
Start: 2023-02-18 — End: 2023-02-18

## 2023-02-18 MED ORDER — MIDAZOLAM HCL 2 MG/2ML IJ SOLN
INTRAMUSCULAR | Status: AC
Start: 2023-02-18 — End: ?
  Filled 2023-02-18: qty 2

## 2023-02-18 MED ORDER — CHLORHEXIDINE GLUCONATE 0.12 % MT SOLN
OROMUCOSAL | Status: AC
  Filled 2023-02-18: qty 15

## 2023-02-18 MED ORDER — PHENYLEPHRINE 80 MCG/ML (10ML) SYRINGE FOR IV PUSH (FOR BLOOD PRESSURE SUPPORT)
PREFILLED_SYRINGE | INTRAVENOUS | Status: AC
  Filled 2023-02-18: qty 10

## 2023-02-18 MED ORDER — PHENYLEPHRINE HCL-NACL 20-0.9 MG/250ML-% IV SOLN
INTRAVENOUS | Status: DC | PRN
  Administered 2023-02-18: 40 ug/min via INTRAVENOUS

## 2023-02-18 MED ORDER — ONDANSETRON HCL 4 MG/2ML IJ SOLN
INTRAMUSCULAR | Status: DC | PRN
  Administered 2023-02-18: 4 mg via INTRAVENOUS

## 2023-02-18 MED ORDER — PROPOFOL 10 MG/ML IV BOLUS
INTRAVENOUS | Status: DC | PRN
  Administered 2023-02-18: 90 mg via INTRAVENOUS

## 2023-02-18 MED ORDER — GEMCITABINE CHEMO FOR BLADDER INSTILLATION 2000 MG
2000.0000 mg | Freq: Once | INTRAVENOUS | Status: DC
Start: 2023-02-18 — End: 2023-02-18
  Filled 2023-02-18: qty 52.6

## 2023-02-18 SURGICAL SUPPLY — 20 items
BAG DRAIN SIEMENS DORNER NS (MISCELLANEOUS) ×3 IMPLANT
BRUSH SCRUB EZ 4% CHG (MISCELLANEOUS) ×3 IMPLANT
DRAPE UTILITY 15X26 TOWEL STRL (DRAPES) ×3 IMPLANT
DRSG TELFA 3X4 N-ADH STERILE (GAUZE/BANDAGES/DRESSINGS) ×3 IMPLANT
ELECT BIVAP BIPO 22/24 DONUT (ELECTROSURGICAL) ×3
ELECTRD BIVAP BIPO 22/24 DONUT (ELECTROSURGICAL) IMPLANT
GLOVE BIOGEL PI IND STRL 7.5 (GLOVE) ×3 IMPLANT
GOWN STRL REUS W/ TWL LRG LVL3 (GOWN DISPOSABLE) ×3 IMPLANT
GOWN STRL REUS W/ TWL XL LVL3 (GOWN DISPOSABLE) ×3 IMPLANT
GUIDEWIRE STR DUAL SENSOR (WIRE) ×3 IMPLANT
IV NS IRRIG 3000ML ARTHROMATIC (IV SOLUTION) ×6 IMPLANT
KIT TURNOVER CYSTO (KITS) ×3 IMPLANT
NDL SAFETY ECLIPSE 18X1.5 (NEEDLE) ×3 IMPLANT
PACK CYSTO AR (MISCELLANEOUS) ×3 IMPLANT
SET CYSTO W/LG BORE CLAMP LF (SET/KITS/TRAYS/PACK) ×3 IMPLANT
SET IRRIG Y TYPE TUR BLADDER L (SET/KITS/TRAYS/PACK) ×3 IMPLANT
SURGILUBE 2OZ TUBE FLIPTOP (MISCELLANEOUS) ×3 IMPLANT
SYR TOOMEY IRRIG 70ML (MISCELLANEOUS) ×3
SYRINGE TOOMEY IRRIG 70ML (MISCELLANEOUS) ×2 IMPLANT
WATER STERILE IRR 1000ML POUR (IV SOLUTION) ×3 IMPLANT

## 2023-02-18 NOTE — Op Note (Addendum)
Date of procedure: 02/18/23  Preoperative diagnosis:  Bladder lesion Gross hematuria  Postoperative diagnosis:  Same  Procedure: Cystoscopy, bilateral retrograde pyelograms with intraoperative interpretation Bladder biopsy(2 cm), and fulguration  Surgeon: Legrand Rams, MD  Anesthesia: General  Complications: None  Intraoperative findings:  Ureteral orifices orthotopic bilaterally, 2 cm patch of erythema at the posterior bladder wall Clear efflux from ureters bilaterally, bilateral retrograde pyelograms with no filling defects Uncomplicated biopsy and fulguration of all abnormal appearing tissue at the posterior bladder wall  EBL: Minimal  Specimens: Bladder biopsy  Drains: None  Indication: Beth Carroll is a 54 y.o. patient with recurrent gross hematuria found to have patch of erythema at the posterior bladder wall and cystoscopy.  After reviewing the management options for treatment, they elected to proceed with the above surgical procedure(s). We have discussed the potential benefits and risks of the procedure, side effects of the proposed treatment, the likelihood of the patient achieving the goals of the procedure, and any potential problems that might occur during the procedure or recuperation. Informed consent has been obtained.  Description of procedure:  The patient was taken to the operating room and general anesthesia was induced. SCDs were placed for DVT prophylaxis. The patient was placed in the dorsal lithotomy position, prepped and draped in the usual sterile fashion, and preoperative antibiotics(gentamicin) were administered. A preoperative time-out was performed.   A 21 French rigid cystoscope was used to intubate the urethra and thorough cystoscopy was performed.  The ureteral orifices were orthotopic bilaterally.  There was clear efflux from both ureters.  There was a 2 cm patch of erythema at the posterior bladder wall, but no other suspicious lesions.  A  sensor wire was used to intubate the right ureteral orifice and a 5 French access catheter advanced over the wire into the distal ureter.  Contrast was injected for retrograde pyelogram which showed no hydronephrosis or filling defects.  An identical procedure was performed on the left side, and again no hydronephrosis or filling defects were noted.  A cold cup biopsy was used to remove all abnormal appearing tissue at the posterior bladder wall.  The Bugbee was unavailable and so the bipolar button was used for hemostasis.  There was no bleeding noted with the bladder decompressed.  Relatively low suspicion for malignancy, and gemcitabine was deferred.  Disposition: Stable to PACU  Plan: Will plan to call with pathology results Can resume blood thinners on Monday  Legrand Rams, MD

## 2023-02-18 NOTE — Anesthesia Preprocedure Evaluation (Signed)
Anesthesia Evaluation  Patient identified by MRN, date of birth, ID band Patient awake    Reviewed: Allergy & Precautions, NPO status , Patient's Chart, lab work & pertinent test results  History of Anesthesia Complications Negative for: history of anesthetic complications  Airway Mallampati: I  TM Distance: <3 FB Neck ROM: Full    Dental  (+) Upper Dentures, Lower Dentures   Pulmonary neg shortness of breath, COPD, neg recent URI, former smoker   Pulmonary exam normal        Cardiovascular hypertension, (-) angina + CAD (s/p stent on Eliquis), + Past MI, + Cardiac Stents, + Peripheral Vascular Disease and +CHF (ICM, EF 40-45%)  (-) CABG Normal cardiovascular exam(-) dysrhythmias (-) Valvular Problems/Murmurs     Neuro/Psych  PSYCHIATRIC DISORDERS      negative neurological ROS     GI/Hepatic ,GERD  ,,  Endo/Other  diabetes, Type 2, Insulin Dependent    Renal/GU Renal disease     Musculoskeletal   Abdominal   Peds  Hematology negative hematology ROS (+)   Anesthesia Other Findings Past Medical History: No date: Acute deep vein thrombosis (DVT) of right lower extremity  (HCC) No date: ADHD (attention deficit hyperactivity disorder) 04/30/2022: AKI (acute kidney injury) (HCC) No date: Aortic atherosclerosis (HCC) No date: COPD (chronic obstructive pulmonary disease) (HCC) 12/15/2020: Coronary artery disease     Comment:  a.) LHC/PCI 12/15/2020: 99% mLAD (2.25 x 18 mm Onyx               Frontier DES), 90% RI (2.75 x 22 mm Onyx Frontier DES),               40% m-dLAD No date: Diabetes mellitus without complication (HCC) No date: Diabetes mellitus, type 2 (HCC) No date: GERD (gastroesophageal reflux disease) No date: HFrEF (heart failure with reduced ejection fraction) (HCC)     Comment:  a.) TTE 12/10/2020: EF 40-45%, apical/periapical HK,               G2DD; b.) LHC 12/15/2020: EF 35-45%; c.) TTE 06/26/2021:                EF 40-45%, LVH, G2DD; d.) TTE 03/21/2022: EF 55-60%,               LV/apical sep/ant wall HK, Triv MR, AoV sclerosis 04/30/2022: High anion gap metabolic acidosis No date: Hyperlipemia No date: Hypertension 04/30/2022: Hypophosphatemia No date: Ischemic cardiomyopathy     Comment:  a.) TTE 12/10/2020: EF 40-45%; b.) LHC 12/15/2020: EF               35-45%; c.) TTE 06/26/2021: EF 40-45%; d.) TTE               03/21/2022: EF 55-60% No date: Meningioma (HCC)     Comment:  a.) CT head and MRI brain 08/21/2021: LEFT parietal               meningioma No date: Myocardial infarction (HCC) 04/30/2022: Nausea vomiting and diarrhea No date: Orthostatic hypotension No date: PAD (peripheral artery disease) (HCC) 04/30/2022: Sepsis secondary to UTI (HCC) No date: Tobacco use   Reproductive/Obstetrics                             Anesthesia Physical Anesthesia Plan  ASA: 3  Anesthesia Plan: General   Post-op Pain Management:    Induction: Intravenous  PONV Risk Score and Plan: 3 and Ondansetron, Dexamethasone and  Treatment may vary due to age or medical condition  Airway Management Planned: LMA and Oral ETT  Additional Equipment:   Intra-op Plan:   Post-operative Plan: Extubation in OR  Informed Consent: I have reviewed the patients History and Physical, chart, labs and discussed the procedure including the risks, benefits and alternatives for the proposed anesthesia with the patient or authorized representative who has indicated his/her understanding and acceptance.       Plan Discussed with: CRNA  Anesthesia Plan Comments:        Anesthesia Quick Evaluation

## 2023-02-18 NOTE — Anesthesia Procedure Notes (Signed)
Procedure Name: Intubation Date/Time: 02/18/2023 12:59 PM  Performed by: Morene Crocker, CRNAPre-anesthesia Checklist: Patient identified, Patient being monitored, Timeout performed, Emergency Drugs available and Suction available Patient Re-evaluated:Patient Re-evaluated prior to induction Oxygen Delivery Method: Circle system utilized Preoxygenation: Pre-oxygenation with 100% oxygen Induction Type: IV induction Ventilation: Mask ventilation without difficulty Laryngoscope Size: 3 and McGrath Grade View: Grade I Tube type: Oral Tube size: 6.5 mm Number of attempts: 1 Airway Equipment and Method: Stylet Placement Confirmation: ETT inserted through vocal cords under direct vision, positive ETCO2 and breath sounds checked- equal and bilateral Secured at: 22 cm Tube secured with: Tape Dental Injury: Teeth and Oropharynx as per pre-operative assessment  Comments: Smooth atraumatic intubation, no complications noted.

## 2023-02-18 NOTE — Transfer of Care (Signed)
Immediate Anesthesia Transfer of Care Note  Patient: Beth Carroll  Procedure(s) Performed: CYSTOSCOPY WITH RETROGRADE PYELOGRAM (Bilateral) FULGURATION OF BLADDER TUMOR WITH BIOSPY  Patient Location: PACU  Anesthesia Type:General  Level of Consciousness: sedated  Airway & Oxygen Therapy: Patient Spontanous Breathing  Post-op Assessment: Report given to RN and Post -op Vital signs reviewed and stable  Post vital signs: Reviewed and stable  Last Vitals:  Vitals Value Taken Time  BP 139/54 02/18/23 1331  Temp    Pulse 79 02/18/23 1334  Resp 30 02/18/23 1334  SpO2 93 % 02/18/23 1334  Vitals shown include unfiled device data.  Last Pain:  Vitals:   02/18/23 1015  TempSrc: Temporal  PainSc:       Patients Stated Pain Goal: 0 (02/18/23 0957)  Complications: No notable events documented.

## 2023-02-18 NOTE — Progress Notes (Signed)
Anesthesia  Dr. Karlton Lemon made aware of blood glucose in pacu 349:  NO new orders

## 2023-02-18 NOTE — H&P (Signed)
02/18/23 12:16 PM   Beth Carroll 12-21-1970 [id number]  CC: Bladder tumor  HPI:  Comorbid 53 year old female with recurrent UTIs and persistent gross hematuria, infections have improved with cranberry tablet prophylaxis and topical estrogen cream.  Cystoscopy appears to show a papillary lesion at the posterior bladder wall concerning for malignancy.     PMH: Past Medical History:  Diagnosis Date   Acute deep vein thrombosis (DVT) of right lower extremity (HCC)    ADHD (attention deficit hyperactivity disorder)    AKI (acute kidney injury) (HCC) 04/30/2022   Anterior myocardial infarction [facility])    Aortic atherosclerosis (HCC)    Atrial fibrillation (HCC) 06/2022   a.) CHA2DS2-VASc = 7 (sex, HFrEF, HTN, DVT x2, prior MI, T2DM) as of 02/17/2023; b.) cardiac rate/rhythm maintained intrinsically without pharmacological intervention; chronically anticoagulated using apixaban   Bladder tumor    COPD (chronic obstructive pulmonary disease) (HCC)    Coronary artery disease 12/15/2020   a.) LHC/PCI 12/15/2020: 99% mLAD (2.25 x 18 mm Onyx Frontier DES), 90% RI (2.75 x 22 mm Onyx Frontier DES), 40% m-dLAD; b.) MV 12/06/2022: EF 51%, evidence of transmural infarct in m-dLAD territory with no sig ischemia   Diabetes mellitus, type 2 (HCC)    Essential hypertension    GERD (gastroesophageal reflux disease)    HFrEF (heart failure with reduced ejection fraction) (HCC)    a.) TTE 12/10/2020: EF 40-45%, apical/periapical HK, G2DD; b.) LHC 12/15/2020: EF 35-45%; c.) TTE 06/26/2021: EF 40-45%, LVH, G2DD; d.) TTE 03/21/2022: EF 55-60%, LV/apical sep/ant wall HK, Triv MR, AoV sclerosis; e.) <V 12/06/2022: EF 50-55%, mid-apical anterosep wall/anterior seg/apical seg AK, triv MR   High anion gap metabolic acidosis 04/30/2022   Hyperlipemia    Hypophosphatemia 04/30/2022   Ischemic cardiomyopathy    a.) TTE 12/10/2020: EF 40-45%; b.) LHC 12/15/2020: EF 35-45%; c.) TTE 06/26/2021: EF 40-45%; d.) TTE  03/21/2022: EF 55-60%; e.) TTE 12/06/2022: 50-55%; f.) MV 12/06/2022: 51%   Long term current use of aspirin    Long term current use of clopidogrel    Meningioma (HCC)    a.) CT head and MRI brain 08/21/2021: LEFT parietal meningioma   Nausea vomiting and diarrhea 04/30/2022   On apixaban therapy    Orthostatic hypotension    PAD (peripheral artery disease) (HCC)    a.) multiple PTA with stents and thrombectomies   Sepsis secondary to UTI (HCC) 04/30/2022   Tobacco use     Surgical History: Past Surgical History:  Procedure Laterality Date   AMPUTATION Right 06/03/2022   Procedure: AMPUTATION RAY ( 1ST AND 2ND TOE);  Surgeon: Annice Needy, MD;  Location: [facility] ORS;  Service: Vascular;  Laterality: Right;   CHOLECYSTECTOMY     CORONARY STENT INTERVENTION N/A 12/15/2020   Procedure: CORONARY STENT INTERVENTION;  Surgeon: Iran Ouch, MD;  Location: [facility];  Service: Cardiovascular;  Laterality: N/A;   INCISION AND DRAINAGE OF WOUND Right 07/09/2022   Procedure: IRRIGATION AND DEBRIDEMENT WOUND;  Surgeon: Linus Galas, DPM;  Location: [facility] OR[facility];  Service: Podiatry;  Laterality: Right;   LEFT HEART CATH AND CORONARY ANGIOGRAPHY N/A 12/15/2020   Procedure: LEFT HEART CATH AND CORONARY ANGIOGRAPHY;  Surgeon: Iran Ouch, MD;  Location: [facility];  Service: Cardiovascular;  Laterality: N/A;   LOWER EXTREMITY ANGIOGRAPHY Left 12/10/2020   Procedure: Lower Extremity Angiography;  Surgeon: Annice Needy, MD;  Location: [facility];  Service: Cardiovascular;  Laterality: Left;   LOWER EXTREMITY ANGIOGRAPHY  Right 12/25/2020   Procedure: LOWER EXTREMITY ANGIOGRAPHY;  Surgeon: Annice Needy, MD;  Location: [facility];  Service: Cardiovascular;  Laterality: Right;   LOWER EXTREMITY ANGIOGRAPHY Right 08/19/2021   Procedure: Lower Extremity Angiography;  Surgeon: Annice Needy, MD;  Location: [facility];  Service: Cardiovascular;  Laterality:  Right;   LOWER EXTREMITY ANGIOGRAPHY Right 08/19/2021   Procedure: Lower Extremity Angiography;  Surgeon: Annice Needy, MD;  Location: [facility];  Service: Cardiovascular;  Laterality: Right;   LOWER EXTREMITY ANGIOGRAPHY Right 04/24/2022   Procedure: Lower Extremity Angiography;  Surgeon: Learta Codding, MD;  Location: [facility];  Service: Cardiovascular;  Laterality: Right;   ORIF FEMUR FRACTURE Right 12/14/2021   Procedure: OPEN REDUCTION INTERNAL FIXATION RIGHT DISTAL FEMUR;  Surgeon: Roby Lofts, MD;  Location: [facility];  Service: Orthopedics;  Laterality: Right;     Family History: Family History  Problem Relation Age of Onset   Hypertension Father     Social History:  reports that she quit smoking about 2 years ago. Her smoking use included cigarettes. She has never been exposed to tobacco smoke. She has never used smokeless tobacco. She reports that she does not currently use alcohol. She reports that she does not use drugs.  Physical Exam: BP (!) 172/64   Pulse 92   Temp 97.8 F (36.6 C) (Temporal)   Resp 16   Ht 5\' 6"  (1.676 m)   Wt 62.6 kg   LMP 01/26/2015   SpO2 99%   BMI 22.27 kg/m    Constitutional:  Alert and oriented, No acute distress. Cardiovascular: Regular rate and rhythm Respiratory: Clear to auscultation bilaterally GI: Abdomen is soft, nontender, nondistended, no abdominal masses   Laboratory Data: Urine culture 1/14 no growth  Assessment & Plan:   Extremely comorbid 53 year old female with recurrent infections, gross hematuria, and cystoscopy showing abnormal papillary tissue within the bladder.  We discussed transurethral resection of bladder tumor (TURBT) and risks and benefits at length. This is typically a 1 to 2-hour procedure done under general anesthesia in the operating room.  A scope is inserted through the urethra and used to resect abnormal tissue within the bladder, which is then sent to the pathologist to determine  grade and stage of the tumor.  Risks include bleeding, infection, need for temporary Foley placement, and bladder perforation.  Treatment strategies are based on the type of tumor and depth of invasion.  We briefly reviewed the different treatment pathways for non-muscle invasive and muscle invasive bladder cancer.  Cystoscopy, bilateral retrograde pyelograms, bladder biopsy/TURBT today, possible gemcitabine  Legrand Rams, MD 02/18/2023  [facility] [address] [place], [place] [address] [phone]

## 2023-02-18 NOTE — Progress Notes (Signed)
Temp 100.7 temporal upon arrival to postop. Rechecked later with oral and was 98.4. Dr Richardo Hanks aware and is okay with discharge to home. Advised patient if temp of 101 or greater with chills/vomiting to present to ER.

## 2023-02-19 ENCOUNTER — Encounter: Payer: Self-pay | Admitting: Urology

## 2023-02-21 LAB — SURGICAL PATHOLOGY

## 2023-02-22 NOTE — Telephone Encounter (Signed)
Pt scheduled

## 2023-02-25 NOTE — Anesthesia Postprocedure Evaluation (Signed)
Anesthesia Post Note  Patient: Beth Carroll  Procedure(s) Performed: CYSTOSCOPY WITH RETROGRADE PYELOGRAM (Bilateral) FULGURATION OF BLADDER TUMOR WITH BIOSPY  Patient location during evaluation: PACU Anesthesia Type: General Level of consciousness: awake and alert Pain management: pain level controlled Vital Signs Assessment: post-procedure vital signs reviewed and stable Respiratory status: spontaneous breathing, nonlabored ventilation, respiratory function stable and patient connected to nasal cannula oxygen Cardiovascular status: blood pressure returned to baseline and stable Postop Assessment: no apparent nausea or vomiting Anesthetic complications: no   No notable events documented.   Last Vitals:  Vitals:   02/18/23 1408 02/18/23 1438  BP: 109/65   Pulse: 80   Resp: 17   Temp: (!) 38.2 C 36.9 C  SpO2: 95%     Last Pain:  Vitals:   02/18/23 1438  TempSrc: Oral  PainSc:                  Lenard Simmer

## 2023-03-01 ENCOUNTER — Other Ambulatory Visit (INDEPENDENT_AMBULATORY_CARE_PROVIDER_SITE_OTHER): Payer: Self-pay | Admitting: Nurse Practitioner

## 2023-03-01 DIAGNOSIS — Z9889 Other specified postprocedural states: Secondary | ICD-10-CM

## 2023-03-04 ENCOUNTER — Encounter (INDEPENDENT_AMBULATORY_CARE_PROVIDER_SITE_OTHER): Payer: Medicaid Other

## 2023-03-04 ENCOUNTER — Encounter (INDEPENDENT_AMBULATORY_CARE_PROVIDER_SITE_OTHER): Payer: Self-pay

## 2023-03-04 ENCOUNTER — Ambulatory Visit (INDEPENDENT_AMBULATORY_CARE_PROVIDER_SITE_OTHER): Payer: Medicaid Other | Admitting: Nurse Practitioner

## 2023-03-15 ENCOUNTER — Telehealth (INDEPENDENT_AMBULATORY_CARE_PROVIDER_SITE_OTHER): Payer: Self-pay

## 2023-03-15 NOTE — Telephone Encounter (Signed)
That is fine, we can move up her appointment.  However if it turns purple again, and if painful, she should go to the ED for evaluation

## 2023-03-15 NOTE — Telephone Encounter (Signed)
Beth Carroll called stating her right foot turned purple yesterday but the color started coming back last night. She is requesting an earlier appt to have it checked.   Please advise

## 2023-04-22 ENCOUNTER — Encounter (INDEPENDENT_AMBULATORY_CARE_PROVIDER_SITE_OTHER): Payer: Medicaid Other

## 2023-04-22 ENCOUNTER — Ambulatory Visit (INDEPENDENT_AMBULATORY_CARE_PROVIDER_SITE_OTHER): Payer: Medicaid Other | Admitting: Nurse Practitioner

## 2023-04-28 ENCOUNTER — Ambulatory Visit (INDEPENDENT_AMBULATORY_CARE_PROVIDER_SITE_OTHER)

## 2023-04-28 DIAGNOSIS — Z9889 Other specified postprocedural states: Secondary | ICD-10-CM | POA: Diagnosis not present

## 2023-04-28 DIAGNOSIS — I739 Peripheral vascular disease, unspecified: Secondary | ICD-10-CM | POA: Diagnosis not present

## 2023-05-02 ENCOUNTER — Ambulatory Visit (INDEPENDENT_AMBULATORY_CARE_PROVIDER_SITE_OTHER): Admitting: Nurse Practitioner

## 2023-05-02 LAB — VAS US ABI WITH/WO TBI
Left ABI: 1.11
Right ABI: 1.13

## 2023-05-13 ENCOUNTER — Telehealth: Payer: Self-pay | Admitting: Cardiovascular Disease

## 2023-05-13 NOTE — Telephone Encounter (Signed)
 Spoke with pt, she is having tenderness in her chest and wanted to make sure it was not her heart. Patient reports that when she touches the area the tenderness gets worse. Reassurance given to the patient that is not heart pain. She was encouraged to reach out to her medical doctor.

## 2023-05-13 NOTE — Telephone Encounter (Signed)
 Pt calling in stating her left arm is sore she's not sure if its heart related. Pt doesn't have any other symptoms. Please advise

## 2023-05-17 ENCOUNTER — Ambulatory Visit (INDEPENDENT_AMBULATORY_CARE_PROVIDER_SITE_OTHER): Admitting: Nurse Practitioner

## 2023-05-17 ENCOUNTER — Encounter (INDEPENDENT_AMBULATORY_CARE_PROVIDER_SITE_OTHER): Payer: Self-pay | Admitting: Nurse Practitioner

## 2023-05-17 VITALS — BP 167/87 | HR 74 | Resp 16 | Wt 140.0 lb

## 2023-05-17 DIAGNOSIS — Z9889 Other specified postprocedural states: Secondary | ICD-10-CM

## 2023-05-17 DIAGNOSIS — I739 Peripheral vascular disease, unspecified: Secondary | ICD-10-CM

## 2023-05-17 DIAGNOSIS — E118 Type 2 diabetes mellitus with unspecified complications: Secondary | ICD-10-CM | POA: Diagnosis not present

## 2023-05-17 DIAGNOSIS — I10 Essential (primary) hypertension: Secondary | ICD-10-CM

## 2023-05-17 DIAGNOSIS — Z794 Long term (current) use of insulin: Secondary | ICD-10-CM

## 2023-05-18 ENCOUNTER — Encounter (INDEPENDENT_AMBULATORY_CARE_PROVIDER_SITE_OTHER): Payer: Self-pay | Admitting: Nurse Practitioner

## 2023-05-18 NOTE — Progress Notes (Signed)
 Subjective:    Patient ID: Beth Carroll, female    DOB: 1970/05/03, 53 y.o.   MRN: [id number] Chief Complaint  Patient presents with   Follow-up    3 month abi/lea follow up    The patient returns to the office for followup regarding atherosclerotic changes of the lower extremities and review of the noninvasive studies.   There have been no interval changes in lower extremity symptoms. No interval shortening of the patient's claudication distance or development of rest pain symptoms. No new ulcers or wounds have occurred since the last visit.  There have been no significant changes to the patient's overall health care.  The patient denies amaurosis fugax or recent TIA symptoms. There are no documented recent neurological changes noted. There is no history of DVT, PE or superficial thrombophlebitis. The patient denies recent episodes of angina or shortness of breath.   ABI Rt=1.13 and Lt=1.11  (previous ABI's Rt=1.18 and Lt=1.13) Duplex ultrasound of the right lower extremity shows biphasic waveforms throughout with biphasic tibial waveforms on the left.  She has good toe waveforms bilaterally.    Review of Systems  Cardiovascular:  Positive for leg swelling.  Neurological:  Positive for weakness.  All other systems reviewed and are negative.      Objective:   Physical Exam Vitals reviewed.  HENT:     Head: Normocephalic.  Cardiovascular:     Rate and Rhythm: Normal rate.  Pulmonary:     Effort: Pulmonary effort is normal.  Musculoskeletal:     Right lower leg: Edema present.     Left lower leg: Edema present.  Skin:    General: Skin is warm and dry.  Neurological:     Mental Status: She is alert and oriented to person, place, and time.  Psychiatric:        Mood and Affect: Mood normal.        Behavior: Behavior normal.        Thought Content: Thought content normal.        Judgment: Judgment normal.     BP (!) 167/87   Pulse 74   Resp 16   Wt 140 lb (63.5  kg)   LMP 01/26/2015   BMI 22.60 kg/m   Past Medical History:  Diagnosis Date   Acute deep vein thrombosis (DVT) of right lower extremity (HCC)    ADHD (attention deficit hyperactivity disorder)    AKI (acute kidney injury) (HCC) 04/30/2022   Anterior myocardial infarction [facility])    Aortic atherosclerosis (HCC)    Atrial fibrillation (HCC) 06/2022   a.) CHA2DS2-VASc = 7 (sex, HFrEF, HTN, DVT x2, prior MI, T2DM) as of 02/17/2023; b.) cardiac rate/rhythm maintained intrinsically without pharmacological intervention; chronically anticoagulated using apixaban    Bladder tumor    COPD (chronic obstructive pulmonary disease) (HCC)    Coronary artery disease 12/15/2020   a.) LHC/PCI 12/15/2020: 99% mLAD (2.25 x 18 mm Onyx Frontier DES), 90% RI (2.75 x 22 mm Onyx Frontier DES), 40% m-dLAD; b.) MV 12/06/2022: EF 51%, evidence of transmural infarct in m-dLAD territory with no sig ischemia   Diabetes mellitus, type 2 (HCC)    Essential hypertension    GERD (gastroesophageal reflux disease)    HFrEF (heart failure with reduced ejection fraction) (HCC)    a.) TTE 12/10/2020: EF 40-45%, apical/periapical HK, G2DD; b.) LHC 12/15/2020: EF 35-45%; c.) TTE 06/26/2021: EF 40-45%, LVH, G2DD; d.) TTE 03/21/2022: EF 55-60%, LV/apical sep/ant wall HK, Triv MR, AoV sclerosis; e.) <V 12/06/2022: EF  50-55%, mid-apical anterosep wall/anterior seg/apical seg AK, triv MR   High anion gap metabolic acidosis 04/30/2022   Hyperlipemia    Hypophosphatemia 04/30/2022   Ischemic cardiomyopathy    a.) TTE 12/10/2020: EF 40-45%; b.) LHC 12/15/2020: EF 35-45%; c.) TTE 06/26/2021: EF 40-45%; d.) TTE 03/21/2022: EF 55-60%; e.) TTE 12/06/2022: 50-55%; f.) MV 12/06/2022: 51%   Long term current use of aspirin     Long term current use of clopidogrel     Meningioma (HCC)    a.) CT head and MRI brain 08/21/2021: LEFT parietal meningioma   Nausea vomiting and diarrhea 04/30/2022   On apixaban  therapy    Orthostatic hypotension     PAD (peripheral artery disease) (HCC)    a.) multiple PTA with stents and thrombectomies   Sepsis secondary to UTI (HCC) 04/30/2022   Tobacco use     Social History   Socioeconomic History   Marital status: Divorced    Spouse name: Not on file   Number of children: 2   Years of education: Not on file   Highest education level: Not on file  Occupational History   Not on file  Tobacco Use   Smoking status: Former    Current packs/day: 0.00    Types: Cigarettes    Quit date: 12/09/2020    Years since quitting: 2.4    Passive exposure: Never   Smokeless tobacco: Never  Vaping Use   Vaping status: Never Used  Substance and Sexual Activity   Alcohol use: Not Currently   Drug use: Never   Sexual activity: Not on file  Other Topics Concern   Not on file  Social History Narrative   Lives with daughter and boyfriend    Social Drivers of Corporate investment banker Strain: Low Risk  (07/15/2022)   Received from [facility]   Overall Financial Resource Strain (CARDIA)    Difficulty of Paying Living Expenses: Not hard at all  Food Insecurity: No Food Insecurity (07/15/2022)   Received from [facility]   Hunger Vital Sign    Worried About Running Out of Food in the Last Year: Never true    Ran Out of Food in the Last Year: Never true  Transportation Needs: No Transportation Needs (07/18/2022)   Received from [facility]   PRAPARE - Transportation    In the past 12 months, has lack of transportation kept you from medical appointments or from getting medications?: No    Lack of Transportation (Non-Medical): No  Physical Activity: Insufficiently Active (04/02/2022)   Received from [facility], [facility]   Exercise Vital Sign    Days of Exercise per Week: 7 days    Minutes of Exercise per Session: 20 min  Stress: No Stress Concern Present (04/02/2022)   Received from [facility], [facility]   [employer] of Occupational Health - Occupational Stress Questionnaire    Feeling of Stress : Not at all  Social Connections: Moderately Isolated (04/02/2022)   Received from [facility], [facility]   Social Connection and Isolation Panel [NHANES]    Frequency of Communication with Friends and Family: More than three times a week    Frequency of Social Gatherings with Friends and Family: Twice a week    Attends Religious Services: More than 4 times per year    Active Member of [employer] or Organizations: No    Attends Club or  Organization Meetings: Never    Marital Status: Divorced  Catering manager Violence: Not At Risk (07/05/2022)   Humiliation, Afraid, Rape, and Kick questionnaire    Fear of Current or Ex-Partner: No    Emotionally Abused: No    Physically Abused: No    Sexually Abused: No    Past Surgical History:  Procedure Laterality Date   AMPUTATION Right 06/03/2022   Procedure: AMPUTATION RAY ( 1ST AND 2ND TOE);  Surgeon: Celso College, MD;  Location: [facility] ORS;  Service: Vascular;  Laterality: Right;   CHOLECYSTECTOMY     CORONARY STENT INTERVENTION N/A 12/15/2020   Procedure: CORONARY STENT INTERVENTION;  Surgeon: Wenona Hamilton, MD;  Location: [facility] INVASIVE CV LAB;  Service: Cardiovascular;  Laterality: N/A;   CYSTOSCOPY W/ RETROGRADES Bilateral 02/18/2023   Procedure: CYSTOSCOPY WITH RETROGRADE PYELOGRAM;  Surgeon: Lawerence Pressman, MD;  Location: [facility] ORS;  Service: Urology;  Laterality: Bilateral;   FULGURATION OF BLADDER TUMOR  02/18/2023   Procedure: FULGURATION OF BLADDER TUMOR WITH BIOSPY;  Surgeon: Lawerence Pressman, MD;  Location: [facility] ORS;  Service: Urology;;   INCISION AND DRAINAGE OF WOUND Right 07/09/2022   Procedure: IRRIGATION AND DEBRIDEMENT WOUND;  Surgeon: Angel Barba, DPM;  Location: [facility] ORS;  Service: Podiatry;  Laterality: Right;   LEFT HEART CATH AND CORONARY ANGIOGRAPHY N/A  12/15/2020   Procedure: LEFT HEART CATH AND CORONARY ANGIOGRAPHY;  Surgeon: Wenona Hamilton, MD;  Location: [facility] CV LAB;  Service: Cardiovascular;  Laterality: N/A;   LOWER EXTREMITY ANGIOGRAPHY Left 12/10/2020   Procedure: Lower Extremity Angiography;  Surgeon: Celso College, MD;  Location: [facility];  Service: Cardiovascular;  Laterality: Left;   LOWER EXTREMITY ANGIOGRAPHY Right 12/25/2020   Procedure: LOWER EXTREMITY ANGIOGRAPHY;  Surgeon: Celso College, MD;  Location: [facility];  Service: Cardiovascular;  Laterality: Right;   LOWER EXTREMITY ANGIOGRAPHY Right 08/19/2021   Procedure: Lower Extremity Angiography;  Surgeon: Celso College, MD;  Location: [facility];  Service: Cardiovascular;  Laterality: Right;   LOWER EXTREMITY ANGIOGRAPHY Right 08/19/2021   Procedure: Lower Extremity Angiography;  Surgeon: Celso College, MD;  Location: [facility];  Service: Cardiovascular;  Laterality: Right;   LOWER EXTREMITY ANGIOGRAPHY Right 04/24/2022   Procedure: Lower Extremity Angiography;  Surgeon: Guss Legacy, MD;  Location: [facility];  Service: Cardiovascular;  Laterality: Right;   ORIF FEMUR FRACTURE Right 12/14/2021   Procedure: OPEN REDUCTION INTERNAL FIXATION RIGHT DISTAL FEMUR;  Surgeon: Laneta Pintos, MD;  Location: [facility];  Service: Orthopedics;  Laterality: Right;    Family History  Problem Relation Age of Onset   Hypertension Father     Allergies  Allergen Reactions   Amoxicillin     Other Reaction(s): throat closing up, sob, cp with amox, clarithromycin for h pylori   Clarithromycin     Other Reaction(s): throat closing up, sob, cp with amox, clarithromycin for h pylori.       Latest Ref Rng & Units 12/05/2022   11:54 AM 11/17/2022    2:28 PM 07/21/2022    1:03 PM  CBC  WBC 4.0 - 10.5 K/uL 4.2  4.3  4.8   Hemoglobin 12.0 - 15.0 g/dL 09.8  11.9  8.1   Hematocrit 36.0 - 46.0 % 36.5  32.4  27.9   Platelets 150 -  400 K/uL 262  221  336       CMP     Component Value Date/Time  NA 136 12/05/2022 1154   NA 137 03/18/2021 1541   K 4.0 12/05/2022 1154   CL 101 12/05/2022 1154   CO2 28 12/05/2022 1154   GLUCOSE 285 (H) 12/05/2022 1154   BUN 15 12/05/2022 1154   BUN 15 03/18/2021 1541   CREATININE 0.53 12/05/2022 1154   CALCIUM  9.0 12/05/2022 1154   PROT 6.8 11/17/2022 1428   ALBUMIN  3.4 (L) 11/17/2022 1428   AST 17 11/17/2022 1428   ALT 14 11/17/2022 1428   ALKPHOS 80 11/17/2022 1428   BILITOT 0.5 11/17/2022 1428   EGFR 107 03/18/2021 1541   GFRNONAA >60 12/05/2022 1154     VAS US  ABI WITH/WO TBI Result Date: 05/02/2023  LOWER EXTREMITY DOPPLER STUDY Patient Name:  Ludie Hudon  Date of Exam:   04/28/2023 Medical Rec #: [id number]      Accession #:    [id number] Date of Birth: [birth year]      Patient Gender: F Patient Age:   53 years Exam Location:  [facility] Procedure:      VAS US  ABI WITH/WO TBI Referring Phys: Mikki Alexander --------------------------------------------------------------------------------  Indications: Claudication, rest pain, and peripheral artery disease. High Risk Factors: Hypertension, coronary artery disease.  Vascular Interventions: 12/25/2020 right SFA DCPTA and stent                          08/19/2021: Aortogram and Selective Right Lower                         Extremity Angiogram. Catheter directed thrombolytic                         therapy with a total of 12 mg of TPA to the Right SFA,                         Popliteal Artery Tib/Peroneal Trunk and Proximal                         Posterior Tibial Artery and placement of a 135 cm total                         length 50 cm working length catheter for continuous                         thrombolytic therapy. Mechanical Thrombectomy to the                         Right SFA and Popliteal Artery with the [place] Rex device.                         PTA of the Right SFA and Popliteal Artery with 2                          inflations with a 5 mm diameter by 30 cm length Lutonix                         drug coated angioplasty balloon.                          08/19/2021: Right  Lower Extremity Angiogram. Mechanical                         Thrombectomy of the Right SFA, Popliteal Artery,                         Tibioperoneal Trunk, Proximal Posterior Tibial Artery                         using the penumbra CAT 6 device. Stent placement to the                         Right Popliteal Artery wit 5 mm diameter by 7.5 cm                         length Viabahn stent. PTA of the Right Posterior Tibial                         Artery with 2.5 mm diameter by 22 cm length angioplasty                         balloon. Stent placement to the Right SFA with a 6 mm                         diameter by 2.5 cm length Viabahn stent.                          04/24/2022: Aortogram and Selective Right Lower                         Extremity Angiogram. PTA of the Right Posterior Tibial                         Artery 2.5 mm. Penumbra aspiration thrombectomy of Right                         CFA, SFA distribution, Popliteal as well Right PFA. 6 mm                         drug eluting PTA of the Right SFA Origin and CFA. 3 mm                         PTA of the Right DFA Origin. Comparison Study: 11/03/2022 Performing Technologist: Tonie Franks RVS  Examination Guidelines: A complete evaluation includes at minimum, Doppler waveform signals and systolic blood pressure reading at the level of bilateral brachial, anterior tibial, and posterior tibial arteries, when vessel segments are accessible. Bilateral testing is considered an integral part of a complete examination. Photoelectric Plethysmograph (PPG) waveforms and toe systolic pressure readings are included as required and additional duplex testing as needed. Limited examinations for reoccurring indications may be performed as noted.  ABI Findings: +---------+------------------+-----+--------+--------+ Right     Rt Pressure (mmHg)IndexWaveformComment  +---------+------------------+-----+--------+--------+ Brachial 124                                     +---------+------------------+-----+--------+--------+ ATA  140               1.13 biphasic         +---------+------------------+-----+--------+--------+ PTA      123               0.99 biphasic         +---------+------------------+-----+--------+--------+ Great Toe131               1.06 Normal           +---------+------------------+-----+--------+--------+ +---------+------------------+-----+--------+-------+ Left     Lt Pressure (mmHg)IndexWaveformComment +---------+------------------+-----+--------+-------+ Brachial 119                                    +---------+------------------+-----+--------+-------+ ATA      118               0.95 biphasic        +---------+------------------+-----+--------+-------+ PTA      138               1.11 biphasic        +---------+------------------+-----+--------+-------+ Great Toe143               1.15 Normal          +---------+------------------+-----+--------+-------+ +-------+-----------+-----------+------------+------------+ ABI/TBIToday's ABIToday's TBIPrevious ABIPrevious TBI +-------+-----------+-----------+------------+------------+ Right  1.13       1.06       1.18        1.09         +-------+-----------+-----------+------------+------------+ Left   1.11       1.15       1.17        .99          +-------+-----------+-----------+------------+------------+ Bilateral ABIs appear essentially unchanged compared to prior study on 11/03/2022. Bilateral TBIs appear essentially unchanged compared to prior study on 11/03/2022.  Summary: Right: Resting right ankle-brachial index is within normal range. The right toe-brachial index is normal. Left: Resting left ankle-brachial index is within normal range. The left toe-brachial index is normal. *See  table(s) above for measurements and observations.  Electronically signed by Mikki Alexander MD on 05/02/2023 at 3:38:28 PM.    Final        Assessment & Plan:   1. Peripheral arterial disease with history of revascularization (HCC) (Primary)  Recommend:  The patient has evidence of atherosclerosis of the lower extremities with no claudication.  The patient does not voice lifestyle limiting changes at this point in time.  Noninvasive studies do not suggest clinically significant change.  No invasive studies, angiography or surgery at this time The patient should continue walking and begin a more formal exercise program.  The patient should continue antiplatelet therapy and aggressive treatment of the lipid abnormalities  No changes in the patient's medications at this time  Continued surveillance is indicated as atherosclerosis is likely to progress with time.    The patient will continue follow up with noninvasive studies as ordered.   2. Controlled type 2 diabetes mellitus with complication, with long-term current use of insulin  (HCC) Continue hypoglycemic medications as already ordered, these medications have been reviewed and there are no changes at this time.  Hgb A1C to be monitored as already arranged by primary service  3. Primary hypertension Continue antihypertensive medications as already ordered, these medications have been reviewed and there are no changes at this time.   Current Outpatient Medications on File Prior to Visit  Medication Sig Dispense Refill  acetaminophen  (TYLENOL ) 650 MG CR tablet Take 650 mg by mouth every 8 (eight) hours as needed for pain.     albuterol  (VENTOLIN  HFA) 108 (90 Base) MCG/ACT inhaler Inhale 2 puffs into the lungs every 6 (six) hours as needed for wheezing or shortness of breath.     apixaban  (ELIQUIS ) 5 MG TABS tablet Take 1 tablet (5 mg total) by mouth 2 (two) times daily. 60 tablet 6   ascorbic acid  (VITAMIN C ) 500 MG tablet Take 1 tablet  (500 mg total) by mouth 2 (two) times daily. 60 tablet 0   aspirin  EC 81 MG tablet Take 81 mg by mouth daily. Swallow whole.     atorvastatin  (LIPITOR) 80 MG tablet Take 80 mg by mouth daily.     B-D UF III MINI PEN NEEDLES 31G X 5 MM MISC See admin instructions.     calcium  carbonate (TUMS - DOSED IN MG ELEMENTAL CALCIUM ) 500 MG chewable tablet Chew 1 tablet (200 mg of elemental calcium  total) by mouth 3 (three) times daily before meals. (Patient taking differently: Chew 1 tablet by mouth daily as needed for heartburn or indigestion.)     clopidogrel  (PLAVIX ) 75 MG tablet Take 75 mg by mouth daily.     Continuous Glucose Sensor (FREESTYLE LIBRE 3 SENSOR) MISC USE 1 DEVICE EVERY 14 DAYS.     estradiol  (ESTRACE ) 0.1 MG/GM vaginal cream Estrogen Cream Instruction Discard applicator Apply pea sized amount to tip of finger to urethra before bed. Wash hands well after application. Use daily for one week then Monday, Wednesday and Friday 42.5 g 12   Insulin  Aspart FlexPen (NOVOLOG ) 100 UNIT/ML Inject 20 Units into the skin with breakfast, with lunch, and with evening meal.     insulin  glargine (LANTUS  SOLOSTAR) 100 UNIT/ML Solostar Pen Inject 35 Units into the skin at bedtime. (Patient taking differently: Inject 20 Units into the skin at bedtime.) 15 mL 11   Multiple Vitamins-Minerals (CENTRUM SILVER 50+WOMEN PO) Take 1 tablet by mouth daily.     nitrofurantoin , macrocrystal-monohydrate, (MACROBID ) 100 MG capsule Take 1 capsule (100 mg total) by mouth daily. 90 capsule 0   pantoprazole  (PROTONIX ) 40 MG tablet Take 1 tablet (40 mg total) by mouth daily. 30 tablet 0   SPIRIVA  HANDIHALER 18 MCG inhalation capsule Place 18 mcg into inhaler and inhale daily.     traMADol  (ULTRAM ) 50 MG tablet Take 50 mg by mouth every 6 (six) hours as needed for severe pain (pain score 7-10).     zinc  sulfate 220 (50 Zn) MG capsule Take 1 capsule (220 mg total) by mouth daily. 30 capsule 0   No current facility-administered  medications on file prior to visit.    There are no Patient Instructions on file for this visit. No follow-ups on file.   Jaquelinne Glendening E Kushal Saunders, NP

## 2023-05-23 ENCOUNTER — Ambulatory Visit: Payer: Self-pay | Admitting: Urology

## 2023-05-30 ENCOUNTER — Ambulatory Visit: Admitting: Urology

## 2023-06-14 ENCOUNTER — Encounter (INDEPENDENT_AMBULATORY_CARE_PROVIDER_SITE_OTHER): Payer: Self-pay

## 2023-06-19 ENCOUNTER — Other Ambulatory Visit (INDEPENDENT_AMBULATORY_CARE_PROVIDER_SITE_OTHER): Payer: Self-pay | Admitting: Nurse Practitioner

## 2023-09-09 ENCOUNTER — Other Ambulatory Visit: Payer: Self-pay

## 2023-09-09 ENCOUNTER — Observation Stay

## 2023-09-09 ENCOUNTER — Observation Stay
Admission: EM | Admit: 2023-09-09 | Discharge: 2023-09-11 | Disposition: A | Discharge status: Home / Self Care | Attending: Internal Medicine | Admitting: Internal Medicine

## 2023-09-09 DIAGNOSIS — I5020 Unspecified systolic (congestive) heart failure: Secondary | ICD-10-CM | POA: Insufficient documentation

## 2023-09-09 DIAGNOSIS — E1110 Type 2 diabetes mellitus with ketoacidosis without coma: Secondary | ICD-10-CM | POA: Diagnosis not present

## 2023-09-09 DIAGNOSIS — I25110 Atherosclerotic heart disease of native coronary artery with unstable angina pectoris: Secondary | ICD-10-CM | POA: Diagnosis not present

## 2023-09-09 DIAGNOSIS — E119 Type 2 diabetes mellitus without complications: Secondary | ICD-10-CM | POA: Insufficient documentation

## 2023-09-09 DIAGNOSIS — Z87891 Personal history of nicotine dependence: Secondary | ICD-10-CM | POA: Diagnosis not present

## 2023-09-09 DIAGNOSIS — E11628 Type 2 diabetes mellitus with other skin complications: Secondary | ICD-10-CM | POA: Diagnosis not present

## 2023-09-09 DIAGNOSIS — I110 Hypertensive heart disease with heart failure: Secondary | ICD-10-CM | POA: Diagnosis not present

## 2023-09-09 DIAGNOSIS — J449 Chronic obstructive pulmonary disease, unspecified: Secondary | ICD-10-CM | POA: Diagnosis present

## 2023-09-09 DIAGNOSIS — Z7982 Long term (current) use of aspirin: Secondary | ICD-10-CM | POA: Diagnosis not present

## 2023-09-09 DIAGNOSIS — R112 Nausea with vomiting, unspecified: Secondary | ICD-10-CM | POA: Diagnosis present

## 2023-09-09 DIAGNOSIS — I2510 Atherosclerotic heart disease of native coronary artery without angina pectoris: Secondary | ICD-10-CM

## 2023-09-09 DIAGNOSIS — N179 Acute kidney failure, unspecified: Secondary | ICD-10-CM | POA: Diagnosis present

## 2023-09-09 DIAGNOSIS — K219 Gastro-esophageal reflux disease without esophagitis: Secondary | ICD-10-CM | POA: Insufficient documentation

## 2023-09-09 DIAGNOSIS — E1010 Type 1 diabetes mellitus with ketoacidosis without coma: Principal | ICD-10-CM

## 2023-09-09 LAB — BETA-HYDROXYBUTYRIC ACID
Beta-Hydroxybutyric Acid: >8 mmol/L — ABNORMAL HIGH (ref 0.05–0.27)
Beta-Hydroxybutyric Acid: >8 mmol/L — ABNORMAL HIGH (ref 0.05–0.27)
Beta-Hydroxybutyric Acid: 4.78 mmol/L — ABNORMAL HIGH (ref 0.05–0.27)

## 2023-09-09 LAB — COMPREHENSIVE METABOLIC PANEL WITH GFR
ALT: 12 U/L (ref 0–44)
AST: 19 U/L (ref 15–41)
Albumin: 4.3 g/dL (ref 3.5–5.0)
Alkaline Phosphatase: 132 U/L — ABNORMAL HIGH (ref 38–126)
Anion gap: 24 — ABNORMAL HIGH (ref 5–15)
BUN: 27 mg/dL — ABNORMAL HIGH (ref 6–20)
CO2: 13 mmol/L — ABNORMAL LOW (ref 22–32)
Calcium: 9.5 mg/dL (ref 8.9–10.3)
Chloride: 98 mmol/L (ref 98–111)
Creatinine, Ser: 1.07 mg/dL — ABNORMAL HIGH (ref 0.44–1.00)
GFR, Estimated: >60 mL/min (ref >60)
Glucose, Bld: 543 mg/dL (ref 70–99)
Potassium: 4 mmol/L (ref 3.5–5.1)
Sodium: 135 mmol/L (ref 135–145)
Total Bilirubin: 2.1 mg/dL — ABNORMAL HIGH (ref 0.0–1.2)
Total Protein: 8.5 g/dL — ABNORMAL HIGH (ref 6.5–8.1)

## 2023-09-09 LAB — CBG MONITORING, ED
Glucose-Capillary: 522 mg/dL (ref 70–99)
Glucose-Capillary: 535 mg/dL (ref 70–99)
Glucose-Capillary: 516 mg/dL (ref 70–99)
Glucose-Capillary: 480 mg/dL — ABNORMAL HIGH (ref 70–99)
Glucose-Capillary: 509 mg/dL (ref 70–99)
Glucose-Capillary: 426 mg/dL — ABNORMAL HIGH (ref 70–99)
Glucose-Capillary: 431 mg/dL — ABNORMAL HIGH (ref 70–99)
Glucose-Capillary: 377 mg/dL — ABNORMAL HIGH (ref 70–99)
Glucose-Capillary: 319 mg/dL — ABNORMAL HIGH (ref 70–99)
Glucose-Capillary: 278 mg/dL — ABNORMAL HIGH (ref 70–99)
Glucose-Capillary: 240 mg/dL — ABNORMAL HIGH (ref 70–99)
Glucose-Capillary: 228 mg/dL — ABNORMAL HIGH (ref 70–99)
Glucose-Capillary: 210 mg/dL — ABNORMAL HIGH (ref 70–99)

## 2023-09-09 LAB — BASIC METABOLIC PANEL WITH GFR
Anion gap: 23 — ABNORMAL HIGH (ref 5–15)
Anion gap: 16 — ABNORMAL HIGH (ref 5–15)
BUN: 28 mg/dL — ABNORMAL HIGH (ref 6–20)
BUN: 25 mg/dL — ABNORMAL HIGH (ref 6–20)
CO2: 10 mmol/L — ABNORMAL LOW (ref 22–32)
CO2: 15 mmol/L — ABNORMAL LOW (ref 22–32)
Calcium: 8.6 mg/dL — ABNORMAL LOW (ref 8.9–10.3)
Calcium: 8.6 mg/dL — ABNORMAL LOW (ref 8.9–10.3)
Chloride: 104 mmol/L (ref 98–111)
Chloride: 105 mmol/L (ref 98–111)
Creatinine, Ser: 0.98 mg/dL (ref 0.44–1.00)
Creatinine, Ser: 0.89 mg/dL (ref 0.44–1.00)
GFR, Estimated: >60 mL/min (ref >60)
GFR, Estimated: >60 mL/min (ref >60)
Glucose, Bld: 465 mg/dL — ABNORMAL HIGH (ref 70–99)
Glucose, Bld: 312 mg/dL — ABNORMAL HIGH (ref 70–99)
Potassium: 3.5 mmol/L (ref 3.5–5.1)
Potassium: 3.1 mmol/L — ABNORMAL LOW (ref 3.5–5.1)
Sodium: 137 mmol/L (ref 135–145)
Sodium: 136 mmol/L (ref 135–145)

## 2023-09-09 LAB — BLOOD GAS, VENOUS
Acid-base deficit: 13.9 mmol/L — ABNORMAL HIGH (ref 0.0–2.0)
Bicarbonate: 13.9 mmol/L — ABNORMAL LOW (ref 20.0–28.0)
O2 Saturation: 72.4 %
Patient temperature: 37
pCO2, Ven: 38 mmHg — ABNORMAL LOW (ref 44–60)
pH, Ven: 7.17 — CL (ref 7.25–7.43)
pO2, Ven: 44 mmHg (ref 32–45)

## 2023-09-09 LAB — CBC
HCT: 42.5 % (ref 36.0–46.0)
Hemoglobin: 13.3 g/dL (ref 12.0–15.0)
MCH: 28.1 pg (ref 26.0–34.0)
MCHC: 31.3 g/dL (ref 30.0–36.0)
MCV: 89.7 fL (ref 80.0–100.0)
Platelets: 257 K/uL (ref 150–400)
RBC: 4.74 MIL/uL (ref 3.87–5.11)
RDW: 13.3 % (ref 11.5–15.5)
WBC: 8 K/uL (ref 4.0–10.5)
nRBC: 0.3 % — ABNORMAL HIGH (ref 0.0–0.2)

## 2023-09-09 LAB — LIPASE, BLOOD: Lipase: 23 U/L (ref 11–51)

## 2023-09-09 LAB — HIV ANTIBODY (ROUTINE TESTING W REFLEX): HIV Screen 4th Generation wRfx: NONREACTIVE

## 2023-09-09 MED ORDER — ACETAMINOPHEN 325 MG PO TABS: 650.0000 mg | ORAL_TABLET | Freq: Four times a day (QID) | ORAL | Status: DC | PRN

## 2023-09-09 MED ORDER — APIXABAN 5 MG PO TABS
5.0000 mg | ORAL_TABLET | Freq: Two times a day (BID) | ORAL | Status: DC
  Administered 2023-09-09 – 2023-09-11 (×4): 5 mg via ORAL
  Filled 2023-09-09 (×4): qty 1

## 2023-09-09 MED ORDER — DEXTROSE 50 % IV SOLN: 0.0000 mL | INTRAVENOUS | Status: DC | PRN

## 2023-09-09 MED ORDER — LACTATED RINGERS IV SOLN: INTRAVENOUS | Status: AC

## 2023-09-09 MED ORDER — UMECLIDINIUM BROMIDE 62.5 MCG/ACT IN AEPB
1.0000 | INHALATION_SPRAY | Freq: Every day | RESPIRATORY_TRACT | Status: DC
  Administered 2023-09-10: 1 via RESPIRATORY_TRACT
  Filled 2023-09-09: qty 7

## 2023-09-09 MED ORDER — ENOXAPARIN SODIUM 40 MG/0.4ML IJ SOSY: 40.0000 mg | PREFILLED_SYRINGE | INTRAMUSCULAR | Status: DC

## 2023-09-09 MED ORDER — ESTRADIOL 0.1 MG/GM VA CREA
1.0000 | TOPICAL_CREAM | VAGINAL | Status: DC
  Administered 2023-09-09: 1 via VAGINAL
  Filled 2023-09-09: qty 42.5

## 2023-09-09 MED ORDER — ONDANSETRON HCL 4 MG/2ML IJ SOLN
4.0000 mg | Freq: Once | INTRAMUSCULAR | Status: AC
  Administered 2023-09-09: 4 mg via INTRAVENOUS
  Filled 2023-09-09: qty 2

## 2023-09-09 MED ORDER — ONDANSETRON HCL 4 MG PO TABS: 4.0000 mg | ORAL_TABLET | Freq: Four times a day (QID) | ORAL | Status: DC | PRN

## 2023-09-09 MED ORDER — INSULIN REGULAR(HUMAN) IN NACL 100-0.9 UT/100ML-% IV SOLN
INTRAVENOUS | Status: DC
  Administered 2023-09-09: 7.5 [IU]/h via INTRAVENOUS
  Administered 2023-09-09: 8 [IU]/h via INTRAVENOUS
  Filled 2023-09-09 (×2): qty 100

## 2023-09-09 MED ORDER — LISINOPRIL 5 MG PO TABS
5.0000 mg | ORAL_TABLET | Freq: Every day | ORAL | Status: DC
  Administered 2023-09-09: 5 mg via ORAL
  Filled 2023-09-09: qty 1

## 2023-09-09 MED ORDER — LACTATED RINGERS IV BOLUS
20.0000 mL/kg | Freq: Once | INTRAVENOUS | Status: AC
  Administered 2023-09-09: 1270 mL via INTRAVENOUS

## 2023-09-09 MED ORDER — ACETAMINOPHEN 650 MG RE SUPP: 650.0000 mg | Freq: Four times a day (QID) | RECTAL | Status: DC | PRN

## 2023-09-09 MED ORDER — CLOPIDOGREL BISULFATE 75 MG PO TABS
75.0000 mg | ORAL_TABLET | Freq: Every day | ORAL | Status: DC
  Administered 2023-09-09 – 2023-09-11 (×3): 75 mg via ORAL
  Filled 2023-09-09 (×3): qty 1

## 2023-09-09 MED ORDER — ALBUTEROL SULFATE (2.5 MG/3ML) 0.083% IN NEBU: 3.0000 mL | INHALATION_SOLUTION | Freq: Four times a day (QID) | RESPIRATORY_TRACT | Status: DC | PRN

## 2023-09-09 MED ORDER — ONDANSETRON HCL 4 MG/2ML IJ SOLN
4.0000 mg | Freq: Four times a day (QID) | INTRAMUSCULAR | Status: DC | PRN
  Administered 2023-09-09: 4 mg via INTRAVENOUS
  Filled 2023-09-09: qty 2

## 2023-09-09 MED ORDER — LOPERAMIDE HCL 2 MG PO CAPS
4.0000 mg | ORAL_CAPSULE | Freq: Once | ORAL | Status: AC
  Administered 2023-09-09: 4 mg via ORAL
  Filled 2023-09-09: qty 2

## 2023-09-09 MED ORDER — POTASSIUM CHLORIDE CRYS ER 20 MEQ PO TBCR
40.0000 meq | EXTENDED_RELEASE_TABLET | Freq: Once | ORAL | Status: AC
  Administered 2023-09-09: 40 meq via ORAL
  Filled 2023-09-09: qty 2

## 2023-09-09 MED ORDER — DEXTROSE IN LACTATED RINGERS 5 % IV SOLN
INTRAVENOUS | Status: AC
  Administered 2023-09-09: 125 mL/h via INTRAVENOUS

## 2023-09-09 MED ORDER — POTASSIUM CHLORIDE 10 MEQ/100ML IV SOLN
10.0000 meq | INTRAVENOUS | Status: AC
  Administered 2023-09-09 – 2023-09-10 (×4): 10 meq via INTRAVENOUS
  Filled 2023-09-09 (×4): qty 100

## 2023-09-09 MED ORDER — METOCLOPRAMIDE HCL 5 MG/ML IJ SOLN
10.0000 mg | Freq: Once | INTRAMUSCULAR | Status: AC
  Administered 2023-09-09: 10 mg via INTRAVENOUS
  Filled 2023-09-09: qty 2

## 2023-09-09 MED ORDER — POLYETHYLENE GLYCOL 3350 17 G PO PACK: 17.0000 g | PACK | Freq: Every day | ORAL | Status: DC | PRN

## 2023-09-09 MED ORDER — PANTOPRAZOLE SODIUM 40 MG PO TBEC
40.0000 mg | DELAYED_RELEASE_TABLET | Freq: Every day | ORAL | Status: DC
  Administered 2023-09-09 – 2023-09-11 (×3): 40 mg via ORAL
  Filled 2023-09-09 (×3): qty 1

## 2023-09-09 MED ORDER — SODIUM CHLORIDE 0.9 % IV BOLUS
1000.0000 mL | Freq: Once | INTRAVENOUS | Status: AC
  Administered 2023-09-09: 1000 mL via INTRAVENOUS

## 2023-09-09 MED ORDER — TIOTROPIUM BROMIDE MONOHYDRATE 18 MCG IN CAPS: 18.0000 ug | ORAL_CAPSULE | Freq: Every day | RESPIRATORY_TRACT | Status: DC

## 2023-09-09 MED ORDER — POTASSIUM CHLORIDE 10 MEQ/100ML IV SOLN
10.0000 meq | INTRAVENOUS | Status: AC
  Administered 2023-09-09 (×2): 10 meq via INTRAVENOUS
  Filled 2023-09-09 (×2): qty 100

## 2023-09-09 MED ORDER — ATORVASTATIN CALCIUM 20 MG PO TABS
80.0000 mg | ORAL_TABLET | Freq: Every day | ORAL | Status: DC
Start: 2023-09-09 — End: 2023-09-11
  Administered 2023-09-09 – 2023-09-11 (×3): 80 mg via ORAL
  Filled 2023-09-09: qty 4
  Filled 2023-09-09: qty 1
  Filled 2023-09-09: qty 4

## 2023-09-09 MED ORDER — ZINC SULFATE 220 (50 ZN) MG PO CAPS
220.0000 mg | ORAL_CAPSULE | Freq: Every day | ORAL | Status: DC
  Administered 2023-09-09 – 2023-09-10 (×2): 220 mg via ORAL
  Filled 2023-09-09 (×2): qty 1

## 2023-09-09 MED ORDER — ASPIRIN 81 MG PO TBEC
81.0000 mg | DELAYED_RELEASE_TABLET | Freq: Every day | ORAL | Status: DC
  Administered 2023-09-09 – 2023-09-11 (×3): 81 mg via ORAL
  Filled 2023-09-09 (×3): qty 1

## 2023-09-09 NOTE — ED Notes (Signed)
 Pt given water with MD approval. Pillow given and pt repositioned.

## 2023-09-09 NOTE — ED Notes (Signed)
 This RN provided pericare and full linen change after a episode of stool incontinence. Pt tolerated well. New own and brief applied. NAD.

## 2023-09-09 NOTE — ED Triage Notes (Signed)
 Pt BIB [facility] from home due to weakness. Pt had a cbg of 481 per EMS. Hx diabetes. AxO upon assessment. Has episode of vomiting.  106/68 98 HR 100 RA

## 2023-09-09 NOTE — H&P (Signed)
 History and Physical:    Beth Carroll   FMW:[id number] DOB: 03/06/1970 DOA: 09/09/2023  Referring MD/provider: Dr. Dorothyann PCP: Rudolpho Norleen BIRCH, MD   Patient coming from: Home  Chief Complaint: Nausea and vomiting x 12 hours  History of Present Illness:   Beth Carroll is an 53 y.o. female with IDDM2 with previous history of DKA, CAD, PVD, atrial fibrillation, HFrEF and COPD was in her usual state of health until last evening when she developed nausea and vomiting.  Patient notes that she woke up feeling well.  In the afternoon she noted that her blood sugar had dropped to 40 so she ate a couple of hostess Twinkies and then developed nausea and noted she had elevated blood sugar.  Patient came to the ED for evaluation.  Prior to that patient denied any malaise, fevers, chills, dysuria, abdominal pain, diarrhea, chest pain or change in her baseline respiratory status.    ED Course:  The patient was noted to be afebrile with normal vital signs but laboratory data was notable for blood sugars in the 500s, bicarb of 13, anion gap of 24 and a pH on VBG at 7.17.  Patient was started on an insulin  drip for recurrent DKA and admitted for further management.  ROS:   ROS   Review of Systems: Per HPI  Past Medical History:   Past Medical History:  Diagnosis Date   Acute deep vein thrombosis (DVT) of right lower extremity (HCC)    ADHD (attention deficit hyperactivity disorder)    AKI (acute kidney injury) (HCC) 04/30/2022   Anterior myocardial infarction [facility])    Aortic atherosclerosis (HCC)    Atrial fibrillation (HCC) 06/2022   a.) CHA2DS2-VASc = 7 (sex, HFrEF, HTN, DVT x2, prior MI, T2DM) as of 02/17/2023; b.) cardiac rate/rhythm maintained intrinsically without pharmacological intervention; chronically anticoagulated using apixaban    Bladder tumor    COPD (chronic obstructive pulmonary disease) (HCC)    Coronary artery disease 12/15/2020   a.) LHC/PCI 12/15/2020: 99% mLAD  (2.25 x 18 mm Onyx Frontier DES), 90% RI (2.75 x 22 mm Onyx Frontier DES), 40% m-dLAD; b.) MV 12/06/2022: EF 51%, evidence of transmural infarct in m-dLAD territory with no sig ischemia   Diabetes mellitus, type 2 (HCC)    Essential hypertension    GERD (gastroesophageal reflux disease)    HFrEF (heart failure with reduced ejection fraction) (HCC)    a.) TTE 12/10/2020: EF 40-45%, apical/periapical HK, G2DD; b.) LHC 12/15/2020: EF 35-45%; c.) TTE 06/26/2021: EF 40-45%, LVH, G2DD; d.) TTE 03/21/2022: EF 55-60%, LV/apical sep/ant wall HK, Triv MR, AoV sclerosis; e.) <V 12/06/2022: EF 50-55%, mid-apical anterosep wall/anterior seg/apical seg AK, triv MR   High anion gap metabolic acidosis 04/30/2022   Hyperlipemia    Hypophosphatemia 04/30/2022   Ischemic cardiomyopathy    a.) TTE 12/10/2020: EF 40-45%; b.) LHC 12/15/2020: EF 35-45%; c.) TTE 06/26/2021: EF 40-45%; d.) TTE 03/21/2022: EF 55-60%; e.) TTE 12/06/2022: 50-55%; f.) MV 12/06/2022: 51%   Long term current use of aspirin     Long term current use of clopidogrel     Meningioma (HCC)    a.) CT head and MRI brain 08/21/2021: LEFT parietal meningioma   Nausea vomiting and diarrhea 04/30/2022   On apixaban  therapy    Orthostatic hypotension    PAD (peripheral artery disease) (HCC)    a.) multiple PTA with stents and thrombectomies   Sepsis secondary to UTI (HCC) 04/30/2022   Tobacco use     Past Surgical History:  Past Surgical History:  Procedure Laterality Date   AMPUTATION Right 06/03/2022   Procedure: AMPUTATION RAY ( 1ST AND 2ND TOE);  Surgeon: Marea Selinda RAMAN, MD;  Location: [facility];  Service: Vascular;  Laterality: Right;   CHOLECYSTECTOMY     CORONARY STENT INTERVENTION N/A 12/15/2020   Procedure: CORONARY STENT INTERVENTION;  Surgeon: Darron Deatrice LABOR, MD;  Location: [facility];  Service: Cardiovascular;  Laterality: N/A;   CYSTOSCOPY W/ RETROGRADES Bilateral 02/18/2023   Procedure: CYSTOSCOPY WITH RETROGRADE  PYELOGRAM;  Surgeon: Francisca Redell BROCKS, MD;  Location: [facility];  Service: Urology;  Laterality: Bilateral;   FULGURATION OF BLADDER TUMOR  02/18/2023   Procedure: FULGURATION OF BLADDER TUMOR WITH BIOSPY;  Surgeon: Francisca Redell BROCKS, MD;  Location: [facility];  Service: Urology;;   INCISION AND DRAINAGE OF WOUND Right 07/09/2022   Procedure: IRRIGATION AND DEBRIDEMENT WOUND;  Surgeon: Neill Boas, DPM;  Location: [facility];  Service: Podiatry;  Laterality: Right;   LEFT HEART CATH AND CORONARY ANGIOGRAPHY N/A 12/15/2020   Procedure: LEFT HEART CATH AND CORONARY ANGIOGRAPHY;  Surgeon: Darron Deatrice LABOR, MD;  Location: [facility];  Service: Cardiovascular;  Laterality: N/A;   LOWER EXTREMITY ANGIOGRAPHY Left 12/10/2020   Procedure: Lower Extremity Angiography;  Surgeon: Marea Selinda RAMAN, MD;  Location: [facility];  Service: Cardiovascular;  Laterality: Left;   LOWER EXTREMITY ANGIOGRAPHY Right 12/25/2020   Procedure: LOWER EXTREMITY ANGIOGRAPHY;  Surgeon: Marea Selinda RAMAN, MD;  Location: [facility];  Service: Cardiovascular;  Laterality: Right;   LOWER EXTREMITY ANGIOGRAPHY Right 08/19/2021   Procedure: Lower Extremity Angiography;  Surgeon: Marea Selinda RAMAN, MD;  Location: [facility];  Service: Cardiovascular;  Laterality: Right;   LOWER EXTREMITY ANGIOGRAPHY Right 08/19/2021   Procedure: Lower Extremity Angiography;  Surgeon: Marea Selinda RAMAN, MD;  Location: [facility];  Service: Cardiovascular;  Laterality: Right;   LOWER EXTREMITY ANGIOGRAPHY Right 04/24/2022   Procedure: Lower Extremity Angiography;  Surgeon: Clarice Martin, MD;  Location: [facility];  Service: Cardiovascular;  Laterality: Right;   ORIF FEMUR FRACTURE Right 12/14/2021   Procedure: OPEN REDUCTION INTERNAL FIXATION RIGHT DISTAL FEMUR;  Surgeon: Kendal Franky SQUIBB, MD;  Location: [facility];  Service: Orthopedics;  Laterality: Right;    Social History:   Social History   Socioeconomic History    Marital status: Divorced    Spouse name: Not on file   Number of children: 2   Years of education: Not on file   Highest education level: Not on file  Occupational History   Not on file  Tobacco Use   Smoking status: Former    Current packs/day: 0.00    Types: Cigarettes    Quit date: 12/09/2020    Years since quitting: 2.7    Passive exposure: Never   Smokeless tobacco: Never  Vaping Use   Vaping status: Never Used  Substance and Sexual Activity   Alcohol use: Not Currently   Drug use: Never   Sexual activity: Not on file  Other Topics Concern   Not on file  Social History Narrative   Lives with daughter and boyfriend    Social Drivers of Health   Financial Resource Strain: Low Risk  (07/15/2022)   Received [facility]   Overall Financial Resource Strain (CARDIA)    Difficulty of Paying Living Expenses: Not hard at all  Food Insecurity: No Food Insecurity (07/15/2022)   Received from [facility]  Hunger Vital Sign    Within the past 12 months, you worried that your food would run out before you got the money to buy more.: Never true    Within the past 12 months, the food you bought just didn't last and you didn't have money to get more.: Never true  Transportation Needs: No Transportation Needs (07/18/2022)   Received from [facility] - Transportation    In the past 12 months, has lack of transportation kept you from medical appointments or from getting medications?: No    Lack of Transportation (Non-Medical): No  Physical Activity: Insufficiently Active (04/02/2022)   Received from [facility]   Exercise Vital Sign    On average, how many days per week do you engage in moderate to strenuous exercise (like a brisk walk)?: 7 days    On average, how many minutes do you engage in exercise at this level?: 20 min  Stress: No Stress Concern Present (04/02/2022)   Received from [facility] - Occupational Stress Questionnaire    Feeling of Stress : Not at all  Social Connections: Moderately Isolated (04/02/2022)   Received from [facility]   Social Connection and Isolation Panel    In a typical week, how many times do you talk on the phone with family, friends, or neighbors?: More than three times a week    How often do you get together with friends or relatives?: Twice a week    How often do you attend church or religious services?: More than 4 times per year    Do you belong to any clubs or organizations such as church groups, unions, fraternal or athletic groups, or school groups?: No    How often do you attend meetings of the clubs or organizations you belong to?: Never    Are you married, widowed, divorced, separated, never married, or living with a partner?: Divorced  Intimate Partner Violence: Not At Risk (07/05/2022)   Humiliation, Afraid, Rape, and Kick questionnaire    Fear of Current or Ex-Partner: No    Emotionally Abused: No    Physically Abused: No    Sexually Abused: No    Allergies   Amoxicillin and Clarithromycin  Family history:   Family History  Problem Relation Age of Onset   Hypertension Father     Current Medications:   Prior to Admission medications   Medication Sig Start Date End Date Taking? Authorizing Provider  lisinopril  (ZESTRIL ) 5 MG tablet Take 5 mg by mouth daily. 08/12/23 08/11/24 Yes [provider]  acetaminophen  (TYLENOL ) 650 MG CR tablet Take 650 mg by mouth every 8 (eight) hours as needed for pain.    [provider]  albuterol  (VENTOLIN  HFA) 108 (90 Base) MCG/ACT inhaler Inhale 2 puffs into the lungs every 6 (six) hours as needed for wheezing or shortness of breath.    [provider]  ascorbic acid  (VITAMIN C ) 500 MG tablet Take 1 tablet (500 mg total) by mouth 2 (two) times daily. 07/14/22   Alexander, Natalie, DO  aspirin  EC 81  MG tablet Take 81 mg by mouth daily. Swallow whole.    [provider]  atorvastatin  (LIPITOR ) 80 MG tablet Take 80 mg by mouth daily.    [provider]  B-D UF III MINI PEN NEEDLES 31G X 5 MM MISC See admin instructions. 07/09/22   [provider]  calcium   carbonate (TUMS - DOSED IN MG ELEMENTAL CALCIUM ) 500 MG chewable tablet Chew 1 tablet (200 mg of elemental calcium  total) by mouth 3 (three) times daily before meals. Patient taking differently: Chew 1 tablet by mouth daily as needed for heartburn or indigestion. 04/28/22   Fausto Burnard LABOR, DO  clopidogrel  (PLAVIX ) 75 MG tablet Take 75 mg by mouth daily. 11/24/22   [provider]  Continuous Glucose Sensor (FREESTYLE LIBRE 3 SENSOR) MISC USE 1 DEVICE EVERY 14 DAYS. 07/05/22   [provider]  ELIQUIS  5 MG TABS tablet TAKE 1 TABLET BY MOUTH TWICE A DAY 06/21/23   Brown, Fallon E, NP  estradiol  (ESTRACE ) 0.1 MG/GM vaginal cream Estrogen Cream Instruction Discard applicator Apply pea sized amount to tip of finger to urethra before bed. Wash hands well after application. Use daily for one week then Monday, Wednesday and Friday 10/06/22   Francisca Redell BROCKS, MD  Insulin  Aspart FlexPen (NOVOLOG ) 100 UNIT/ML Inject 20 Units into the skin with breakfast, with lunch, and with evening meal. 01/11/23   [provider]  insulin  glargine (LANTUS  SOLOSTAR) 100 UNIT/ML Solostar Pen Inject 35 Units into the skin at bedtime. Patient taking differently: Inject 20 Units into the skin at bedtime. 07/14/22   Alexander, Natalie, DO  Multiple Vitamins-Minerals (CENTRUM SILVER 50+WOMEN PO) Take 1 tablet by mouth daily.    [provider]  nitrofurantoin , macrocrystal-monohydrate, (MACROBID ) 100 MG capsule Take 1 capsule (100 mg total) by mouth daily. 10/06/22   Francisca Redell BROCKS, MD  pantoprazole  (PROTONIX ) 40 MG tablet Take 1 tablet (40 mg total) by mouth daily. 12/17/20   Elgergawy, Brayton RAMAN, MD  SPIRIVA  HANDIHALER  18 MCG inhalation capsule Place 18 mcg into inhaler and inhale daily. 12/15/22   [provider]  traMADol  (ULTRAM ) 50 MG tablet Take 50 mg by mouth every 6 (six) hours as needed for severe pain (pain score 7-10). 07/22/22   [provider]  zinc  sulfate 220 (50 Zn) MG capsule Take 1 capsule (220 mg total) by mouth daily. 07/14/22   Marsa Edelman, DO    Physical Exam:   Vitals:   09/09/23 1234 09/09/23 1237 09/09/23 1430 09/09/23 1500  BP: (!) 146/59  (!) 104/53 104/65  Pulse: 88  96 96  Resp: 20  14 11   Temp: 98.3 F (36.8 C)     TempSrc: Oral     SpO2: 100%  100% 100%  Weight:  63.5 kg    Height:  5' 6 (1.676 m)       Physical Exam: Blood pressure 104/65, pulse 96, temperature 98.3 F (36.8 C), temperature source Oral, resp. rate 11, height 5' 6 (1.676 m), weight 63.5 kg, last menstrual period 01/26/2015, SpO2 100%. Gen: Chronically ill-appearing female sitting up in bed appearing nauseated, attentive son at bedside Eyes: conjuctiva mildly injected bilaterally CVS: S1-S2, regulary, no gallops Respiratory:  decreased air entry likely secondary to decreased inspiratory effort, few rales at bases bilaterally GI: NABS, soft, NT  LE: Chronic lower extremity edema to ankles without erythema, decreased muscle mass, left ray amputation scar is well-healed, no evidence of recurrent lower extremity infection Neuro: A/O x 3, grossly nonfocal.  Psych: patient is coherent,mood and affect appropriate to situation.   Data Review:    Labs: Basic Metabolic Panel: Recent Labs  Lab 09/09/23 1248  NA 135  K 4.0  CL 98  CO2 13*  GLUCOSE 543*  BUN 27*  CREATININE 1.07*  CALCIUM  9.5   Liver Function Tests: Recent  Labs  Lab 09/09/23 1248  AST 19  ALT 12  ALKPHOS 132*  BILITOT 2.1*  PROT 8.5*  ALBUMIN  4.3   Recent Labs  Lab 09/09/23 1248  LIPASE 23   No results for input(s): AMMONIA in the last 168 hours. CBC: Recent Labs  Lab 09/09/23 1248   WBC 8.0  HGB 13.3  HCT 42.5  MCV 89.7  PLT 257   Cardiac Enzymes: No results for input(s): CKTOTAL, CKMB, CKMBINDEX, TROPONINI in the last 168 hours.  BNP (last 3 results) No results for input(s): PROBNP in the last 8760 hours. CBG: Recent Labs  Lab 09/09/23 1400 09/09/23 1428 09/09/23 1504 09/09/23 1536  GLUCAP 522* 535* 516* 509*    Urinalysis    Component Value Date/Time   COLORURINE AMBER (A) 02/08/2023 1520   APPEARANCEUR HAZY (A) 02/08/2023 1520   APPEARANCEUR Cloudy (A) 12/22/2022 0821   LABSPEC 1.015 02/08/2023 1520   LABSPEC 1.014 08/30/2012 0047   PHURINE 6.0 02/08/2023 1520   GLUCOSEU 100 (A) 02/08/2023 1520   GLUCOSEU >=500 08/30/2012 0047   HGBUR LARGE (A) 02/08/2023 1520   BILIRUBINUR NEGATIVE 02/08/2023 1520   BILIRUBINUR Negative 12/22/2022 0821   BILIRUBINUR Negative 08/30/2012 0047   KETONESUR NEGATIVE 02/08/2023 1520   PROTEINUR TRACE (A) 02/08/2023 1520   NITRITE NEGATIVE 02/08/2023 1520   LEUKOCYTESUR TRACE (A) 02/08/2023 1520   LEUKOCYTESUR 1+ 08/30/2012 0047      Radiographic Studies: No results found.  EKG: Independently reviewed.  Ordered and pending   Assessment/Plan:   Principal Problem:   DKA (diabetic ketoacidosis) (HCC)   DM 2, poorly controlled complicated by soft tissue infections Hyperglycemia Anion gap metabolic acidosis thought secondary to DKA Patient with DM 2 with second episode of DKA and now with labile blood sugars. No evidence of clear provocation other than sudden hypoglycemia with correction to hyperglycemia and DKA.  Diabetes coordinator and nutrition are consulted. Patient placed on DKA protocol with Glucomander, n.p.o., potassium replacement Patient states she does follow with a diabetologist as an outpatient, will need close follow-up  HFrEF HTN Lungs with few rales at bases however patient is oxygenating at 100% on room air with no evidence of any respiratory distress.  Will order chest  x-ray. Continue home doses of lisinopril  5, patient does not appear to be on any other heart failure medications.   Patient appears euvolemic  Atrial fibrillation Patient appears rate controlled on no rate control medications Continue Eliquis   CAD PVD No chest pain, lower extremity exam without infection Continue aspirin , Plavix  and atorvastatin   COPD No wheezes on exam Continue inhaled bronchodilators per home doses  GERD Continue PPI    Other information:   DVT prophylaxis: Eliquis  ordered. Code Status: Full Family Communication: Son was at bedside throughout Disposition Plan: Home Consults called: None Admission status: Observation  Ezzie Senat Tublu Byrant Valent [facility]  If 7PM-7AM, please contact night-coverage [email or web address]

## 2023-09-09 NOTE — ED Provider Notes (Signed)
 [facility] Provider Note    Event Date/Time   First MD Initiated Contact with Patient 09/09/23 1239     (approximate)  History   Chief Complaint: Hyperglycemia and Weakness  HPI  Beth Carroll is a 53 y.o. female with a past medical history of DVT, atrial fibrillation on apixaban , COPD, diabetes, gastric reflux, hyperlipidemia, presents to the emergency department for nausea vomiting and elevated blood sugar.  According to the patient since this morning she has been nauseated with frequent episodes of vomiting.  Patient states she has been feeling somewhat weak over the last couple days.  EMS reports a blood sugar greater than 400.  Patient states mild abdominal discomfort but denies any pain.  Physical Exam   Triage Vital Signs: ED Triage Vitals  Encounter Vitals Group     BP 09/09/23 1234 (!) 146/59     Girls Systolic BP Percentile --      Girls Diastolic BP Percentile --      Boys Systolic BP Percentile --      Boys Diastolic BP Percentile --      Pulse Rate 09/09/23 1234 88     Resp 09/09/23 1234 20     Temp 09/09/23 1234 98.3 F (36.8 C)     Temp Source 09/09/23 1234 Oral     SpO2 09/09/23 1234 100 %     Weight 09/09/23 1237 140 lb (63.5 kg)     Height 09/09/23 1237 5' 6 (1.676 m)     Head Circumference --      Peak Flow --      Pain Score 09/09/23 1237 6     Pain Loc --      Pain Education --      Exclude from Growth Chart --     Most recent vital signs: Vitals:   09/09/23 1234  BP: (!) 146/59  Pulse: 88  Resp: 20  Temp: 98.3 F (36.8 C)  SpO2: 100%    General: Awake, no distress.  Dry appearing mucous membranes.  Patient appears fatigued but answering questions appropriately. CV:  Good peripheral perfusion.  Regular rate and rhythm  Resp:  Normal effort.  Equal breath sounds bilaterally.  Abd:  No distention.  Soft, nontender.  No rebound or guarding.  Benign abdomen. Other:  Dried vomitus on shirt.   ED Results /  Procedures / Treatments   MEDICATIONS ORDERED IN ED: Medications  sodium chloride  0.9 % bolus 1,000 mL (has no administration in time range)  ondansetron  (ZOFRAN ) injection 4 mg (has no administration in time range)     IMPRESSION / MDM / ASSESSMENT AND PLAN / ED COURSE  I reviewed the triage vital signs and the nursing notes.  Patient's presentation is most consistent with acute presentation with potential threat to life or bodily function.  Patient presents to the emergency department for nausea vomiting found to be hyperglycemic greater than 400.  Very dry appearing mucous membranes.  We will check labs, VBG, dose IV fluids and continue to closely monitor while awaiting results.  Symptoms seem suggestive or concerning for possible DKA.  Patient's lab work has resulted showing a reassuring CBC, patient's chemistry however shows a blood glucose of 543 with a bicarb of 13 and anion gap of 24 consistent with likely diabetic ketoacidosis.  Lipase is normal.  VBG pH has resulted with a pH of 7.17 again confirming diabetic ketoacidosis.  We will start the patient on IV insulin  in addition to her IV fluids.  Will admit to the hospital service for ongoing workup and treatment.   CRITICAL CARE Performed by: Franky Moores   Total critical care time: 30 minutes  Critical care time was exclusive of separately billable procedures and treating other patients.  Critical care was necessary to treat or prevent imminent or life-threatening deterioration.  Critical care was time spent personally by me on the following activities: development of treatment plan with patient and/or surrogate as well as nursing, discussions with consultants, evaluation of patient's response to treatment, examination of patient, obtaining history from patient or surrogate, ordering and performing treatments and interventions, ordering and review of laboratory studies, ordering and review of radiographic studies, pulse  oximetry and re-evaluation of patient's condition.   FINAL CLINICAL IMPRESSION(S) / ED DIAGNOSES   Nausea vomiting Hyperglycemia Diabetic ketoacidosis  Note:  This document was prepared using Dragon voice recognition software and may include unintentional dictation errors.   Moores Franky, MD 09/09/23 1352

## 2023-09-09 NOTE — ED Notes (Signed)
 This RN changed pt after having multiple episodes of stool incontinence. Vomit covered shirt removed and placed in a belongings bag. Pt provided with peri care and oral care. New gown applied. Brief applied. Pt tolerated well. NAD. Pt repositioned.

## 2023-09-10 ENCOUNTER — Encounter: Payer: Self-pay | Admitting: Internal Medicine

## 2023-09-10 DIAGNOSIS — E1110 Type 2 diabetes mellitus with ketoacidosis without coma: Secondary | ICD-10-CM | POA: Diagnosis not present

## 2023-09-10 LAB — CBG MONITORING, ED
Glucose-Capillary: 179 mg/dL — ABNORMAL HIGH (ref 70–99)
Glucose-Capillary: 206 mg/dL — ABNORMAL HIGH (ref 70–99)
Glucose-Capillary: 209 mg/dL — ABNORMAL HIGH (ref 70–99)

## 2023-09-10 LAB — PHOSPHORUS
Phosphorus: 1.4 mg/dL — ABNORMAL LOW (ref 2.5–4.6)
Phosphorus: 3.6 mg/dL (ref 2.5–4.6)

## 2023-09-10 LAB — BASIC METABOLIC PANEL WITH GFR
Anion gap: 5 (ref 5–15)
Anion gap: 7 (ref 5–15)
Anion gap: 9 (ref 5–15)
BUN: 20 mg/dL (ref 6–20)
BUN: 22 mg/dL — ABNORMAL HIGH (ref 6–20)
BUN: 23 mg/dL — ABNORMAL HIGH (ref 6–20)
CO2: 21 mmol/L — ABNORMAL LOW (ref 22–32)
CO2: 23 mmol/L (ref 22–32)
CO2: 24 mmol/L (ref 22–32)
Calcium: 8.5 mg/dL — ABNORMAL LOW (ref 8.9–10.3)
Calcium: 8.6 mg/dL — ABNORMAL LOW (ref 8.9–10.3)
Calcium: 8.8 mg/dL — ABNORMAL LOW (ref 8.9–10.3)
Chloride: 106 mmol/L (ref 98–111)
Chloride: 106 mmol/L (ref 98–111)
Chloride: 109 mmol/L (ref 98–111)
Creatinine, Ser: 0.51 mg/dL (ref 0.44–1.00)
Creatinine, Ser: 0.6 mg/dL (ref 0.44–1.00)
Creatinine, Ser: 0.63 mg/dL (ref 0.44–1.00)
GFR, Estimated: >60 mL/min (ref >60)
GFR, Estimated: >60 mL/min (ref >60)
GFR, Estimated: >60 mL/min (ref >60)
Glucose, Bld: 139 mg/dL — ABNORMAL HIGH (ref 70–99)
Glucose, Bld: 158 mg/dL — ABNORMAL HIGH (ref 70–99)
Glucose, Bld: 222 mg/dL — ABNORMAL HIGH (ref 70–99)
Potassium: 3.7 mmol/L (ref 3.5–5.1)
Potassium: 4 mmol/L (ref 3.5–5.1)
Potassium: 4 mmol/L (ref 3.5–5.1)
Sodium: 136 mmol/L (ref 135–145)
Sodium: 137 mmol/L (ref 135–145)
Sodium: 137 mmol/L (ref 135–145)

## 2023-09-10 LAB — HEMOGLOBIN A1C
Hgb A1c MFr Bld: 12.8 % — ABNORMAL HIGH (ref 4.8–5.6)
Mean Plasma Glucose: 321 mg/dL

## 2023-09-10 LAB — BETA-HYDROXYBUTYRIC ACID
Beta-Hydroxybutyric Acid: 0.66 mmol/L — ABNORMAL HIGH (ref 0.05–0.27)
Beta-Hydroxybutyric Acid: 0.15 mmol/L (ref 0.05–0.27)
Beta-Hydroxybutyric Acid: 0.45 mmol/L — ABNORMAL HIGH (ref 0.05–0.27)

## 2023-09-10 LAB — URINALYSIS, COMPLETE (UACMP) WITH MICROSCOPIC
Bilirubin Urine: NEGATIVE
Glucose, UA: >=500 mg/dL — AB
Hgb urine dipstick: NEGATIVE
Ketones, ur: 80 mg/dL — AB
Nitrite: NEGATIVE
Protein, ur: 30 mg/dL — AB
Specific Gravity, Urine: 1.014 (ref 1.005–1.030)
WBC, UA: >50 WBC/hpf (ref 0–5)
pH: 6 (ref 5.0–8.0)

## 2023-09-10 LAB — GLUCOSE, CAPILLARY
Glucose-Capillary: 155 mg/dL — ABNORMAL HIGH (ref 70–99)
Glucose-Capillary: 156 mg/dL — ABNORMAL HIGH (ref 70–99)
Glucose-Capillary: 142 mg/dL — ABNORMAL HIGH (ref 70–99)
Glucose-Capillary: 151 mg/dL — ABNORMAL HIGH (ref 70–99)
Glucose-Capillary: 139 mg/dL — ABNORMAL HIGH (ref 70–99)
Glucose-Capillary: 145 mg/dL — ABNORMAL HIGH (ref 70–99)
Glucose-Capillary: 127 mg/dL — ABNORMAL HIGH (ref 70–99)
Glucose-Capillary: 132 mg/dL — ABNORMAL HIGH (ref 70–99)
Glucose-Capillary: 202 mg/dL — ABNORMAL HIGH (ref 70–99)
Glucose-Capillary: 270 mg/dL — ABNORMAL HIGH (ref 70–99)
Glucose-Capillary: 323 mg/dL — ABNORMAL HIGH (ref 70–99)

## 2023-09-10 LAB — MRSA NEXT GEN BY PCR, NASAL: MRSA by PCR Next Gen: NOT DETECTED

## 2023-09-10 MED ORDER — INSULIN ASPART 100 UNIT/ML IJ SOLN
0.0000 [IU] | Freq: Three times a day (TID) | INTRAMUSCULAR | Status: DC
  Administered 2023-09-10: 3 [IU] via SUBCUTANEOUS
  Administered 2023-09-10: 1 [IU] via SUBCUTANEOUS
  Administered 2023-09-11: 7 [IU] via SUBCUTANEOUS
  Administered 2023-09-11: 3 [IU] via SUBCUTANEOUS
  Filled 2023-09-10 (×4): qty 1

## 2023-09-10 MED ORDER — INSULIN ASPART 100 UNIT/ML IJ SOLN: 0.0000 [IU] | Freq: Three times a day (TID) | INTRAMUSCULAR | Status: DC

## 2023-09-10 MED ORDER — INSULIN ASPART 100 UNIT/ML IJ SOLN: 0.0000 [IU] | Freq: Every day | INTRAMUSCULAR | Status: DC

## 2023-09-10 MED ORDER — INSULIN ASPART 100 UNIT/ML IJ SOLN: 15.0000 [IU] | Freq: Three times a day (TID) | INTRAMUSCULAR | Status: DC

## 2023-09-10 MED ORDER — CALCIUM CARBONATE ANTACID 500 MG PO CHEW: 400.0000 mg | CHEWABLE_TABLET | Freq: Three times a day (TID) | ORAL | Status: DC | PRN

## 2023-09-10 MED ORDER — INSULIN GLARGINE-YFGN 100 UNIT/ML ~~LOC~~ SOLN
20.0000 [IU] | Freq: Every day | SUBCUTANEOUS | Status: DC
  Administered 2023-09-10 – 2023-09-11 (×2): 20 [IU] via SUBCUTANEOUS
  Filled 2023-09-10 (×2): qty 0.2

## 2023-09-10 MED ORDER — CHLORHEXIDINE GLUCONATE CLOTH 2 % EX PADS
6.0000 | MEDICATED_PAD | Freq: Every day | CUTANEOUS | Status: DC
  Administered 2023-09-10: 6 via TOPICAL

## 2023-09-10 MED ORDER — POTASSIUM PHOSPHATES 15 MMOLE/5ML IV SOLN
30.0000 mmol | Freq: Once | INTRAVENOUS | Status: AC
  Administered 2023-09-10: 30 mmol via INTRAVENOUS
  Filled 2023-09-10: qty 10

## 2023-09-10 MED ORDER — INSULIN ASPART 100 UNIT/ML IJ SOLN
0.0000 [IU] | Freq: Every day | INTRAMUSCULAR | Status: DC
  Administered 2023-09-10: 4 [IU] via SUBCUTANEOUS
  Filled 2023-09-10: qty 1

## 2023-09-10 NOTE — Plan of Care (Signed)
  Problem: Education: Goal: Ability to describe self-care measures that may prevent or decrease complications (Diabetes Survival Skills Education) will improve Outcome: Progressing Goal: Individualized Educational Video(s) Outcome: Progressing   Problem: Coping: Goal: Ability to adjust to condition or change in health will improve Outcome: Progressing   Problem: Health Behavior/Discharge Planning: Goal: Ability to identify and utilize available resources and services will improve Outcome: Progressing Goal: Ability to manage health-related needs will improve Outcome: Progressing   Problem: Metabolic: Goal: Ability to maintain appropriate glucose levels will improve Outcome: Progressing   Problem: Nutritional: Goal: Maintenance of adequate nutrition will improve Outcome: Progressing Goal: Progress toward achieving an optimal weight will improve Outcome: Progressing

## 2023-09-10 NOTE — Care Management Obs Status (Signed)
 MEDICARE OBSERVATION STATUS NOTIFICATION   Patient Details  Name: Beth Carroll MRN: [id number] Date of Birth: [birth year]   Medicare Observation Status Notification Given:  Yes    Rojelio [name] 09/10/2023, 12:01 PM

## 2023-09-10 NOTE — Progress Notes (Addendum)
 Progress Note    Beth Carroll  FMW:[id number] DOB: [birth year]  DOA: 09/09/2023 PCP: Rudolpho Norleen BIRCH, MD      Brief Narrative:    Medical records reviewed and are as summarized below:  Beth Carroll is a 53 y.o. female  with IDDM2 with previous history of DKA, CAD, PVD, atrial fibrillation, HFrEF and COPD, who presented to the hospital with general weakness, nausea and vomiting.  She said that her sugar had dropped to 40 so she ate a couple of hostess Twinkies.  Subsequently, she developed hyperglycemia.  When EMS arrived, her glucose was 481.  In the ED, glucose was 543.  She was found to have DKA.     Assessment/Plan:   Principal Problem:   DKA (diabetic ketoacidosis) (HCC) Active Problems:   CAD S/P percutaneous coronary angioplasty   AKI (acute kidney injury) (HCC)   COPD (chronic obstructive pulmonary disease) (HCC)   Hypophosphatemia    Body mass index is 23.24 kg/m.    DKA, brittle type II diabetes mellitus: Start Lantus  20 units daily and wean off IV insulin  infusion.  Start NovoLog  15 units 3 times daily with meals and sliding scale insulin  when insulin  drip resolved.  Discontinue IV fluids. Home insulin  regimen as follows: Lantus  20 units nightly NovoLog  20 units 3 times daily with meals.   Hypotension: She said her BP tends to stay on the low side.  Hold lisinopril  for now.   AKI: Resolved Hypophosphatemia: Resolved   CAD, PVD: Continue aspirin , Plavix  and statin Chronic HFpEF: Compensated. 2D echo in November 2024 showed EF estimated 50 to 55%, normal LV diastolic parameters.   Paroxysmal atrial fibrillation: Continue Eliquis    GERD: Continue Protonix .  Tums as needed.   COPD: Stable.  Bronchodilators as needed.  Diet Order             Diet Carb Modified Fluid consistency: Thin  Diet effective now                                  Consultants: None  Procedures: None    Medications:    apixaban   5 mg  Oral BID   aspirin  EC  81 mg Oral Daily   atorvastatin   80 mg Oral Daily   Chlorhexidine  Gluconate Cloth  6 each Topical Daily   clopidogrel   75 mg Oral Daily   estradiol   1 Applicatorful Vaginal Once per day on Monday Wednesday Friday   insulin  aspart  0-5 Units Subcutaneous QHS   insulin  aspart  0-9 Units Subcutaneous TID WC   insulin  aspart  15 Units Subcutaneous TID WC   insulin  glargine-yfgn  20 Units Subcutaneous Daily   pantoprazole   40 mg Oral Daily   umeclidinium bromide   1 puff Inhalation Daily   zinc  sulfate (50mg  elemental zinc )  220 mg Oral Daily   Continuous Infusions:  dextrose  5% lactated ringers  Stopped (09/10/23 0209)   lactated ringers  Stopped (09/09/23 2135)     Anti-infectives (From admission, onward)    None              Family Communication/Anticipated D/C date and plan/Code Status   DVT prophylaxis:  apixaban  (ELIQUIS ) tablet 5 mg     Code Status: Full Code  Family Communication: Plan discussed with her significant other and son Tatiana) at the bedside Disposition Plan: Plan to discharge home   Status is: Observation The patient will require care spanning >  2 midnights and should be moved to inpatient because: DKA, monitor glucose levels       Subjective:   Interval events noted.  She complains of nausea and acid reflux.  She requested Tums for acid reflux.  Joe (significant other) and Ozell (son) were at the bedside.  Objective:    Vitals:   09/10/23 1000 09/10/23 1100 09/10/23 1124 09/10/23 1200  BP: (!) 104/54 (!) 99/49  (!) 107/52  Pulse: 74 79  79  Resp: 15 (!) 24  14  Temp:   99.1 F (37.3 C)   TempSrc:   Oral   SpO2: 96% 96%  97%  Weight:      Height:       No data found.   Intake/Output Summary (Last 24 hours) at 09/10/2023 1229 Last data filed at 09/10/2023 1200 Gross per 24 hour  Intake 4220.31 ml  Output --  Net 4220.31 ml   Filed Weights   09/09/23 1237 09/10/23 0300  Weight: 63.5 kg 65.3 kg     Exam:  GEN: NAD SKIN: Warm and dry EYES: No pallor or icterus ENT: MMM CV: RRR PULM: CTA B ABD: soft, ND, NT, +BS CNS: AAO x 3, non focal EXT: B/l leg edema, no tenderness        Data Reviewed:   I have personally reviewed following labs and imaging studies:  Labs: Labs show the following:   Basic Metabolic Panel: Recent Labs  Lab 09/09/23 1610 09/09/23 1955 09/10/23 0023 09/10/23 0601 09/10/23 0943  NA 137 136 137 137 136  K 3.5 3.1* 3.7 4.0 4.0  CL 104 105 106 109 106  CO2 10* 15* 24 23 21*  GLUCOSE 465* 312* 222* 158* 139*  BUN 28* 25* 22* 23* 20  CREATININE 0.98 0.89 0.63 0.51 0.60  CALCIUM  8.6* 8.6* 8.8* 8.6* 8.5*  PHOS  --   --  1.4*  --  3.6   GFR Estimated Creatinine Clearance: 75.3 mL/min (by C-G formula based on SCr of 0.6 mg/dL). Liver Function Tests: Recent Labs  Lab 09/09/23 1248  AST 19  ALT 12  ALKPHOS 132*  BILITOT 2.1*  PROT 8.5*  ALBUMIN  4.3   Recent Labs  Lab 09/09/23 1248  LIPASE 23   No results for input(s): AMMONIA in the last 168 hours. Coagulation profile No results for input(s): INR, PROTIME in the last 168 hours.  CBC: Recent Labs  Lab 09/09/23 1248  WBC 8.0  HGB 13.3  HCT 42.5  MCV 89.7  PLT 257   Cardiac Enzymes: No results for input(s): CKTOTAL, CKMB, CKMBINDEX, TROPONINI in the last 168 hours. BNP (last 3 results) No results for input(s): PROBNP in the last 8760 hours. CBG: Recent Labs  Lab 09/10/23 0642 09/10/23 0848 09/10/23 1016 09/10/23 1118 09/10/23 1222  GLUCAP 151* 139* 145* 127* 132*   D-Dimer: No results for input(s): DDIMER in the last 72 hours. Hgb A1c: Recent Labs    09/09/23 1610  HGBA1C 12.8*   Lipid Profile: No results for input(s): CHOL, HDL, LDLCALC, TRIG, CHOLHDL, LDLDIRECT in the last 72 hours. Thyroid function studies: No results for input(s): TSH, T4TOTAL, T3FREE, THYROIDAB in the last 72 hours.  Invalid input(s):  FREET3 Anemia work up: No results for input(s): VITAMINB12, FOLATE, FERRITIN, TIBC, IRON, RETICCTPCT in the last 72 hours. Sepsis Labs: Recent Labs  Lab 09/09/23 1248  WBC 8.0    Microbiology Recent Results (from the past 240 hours)  MRSA Next Gen by PCR, Nasal  Status: None   Collection Time: 09/10/23  5:52 AM   Specimen: Nasal Mucosa; Nasal Swab  Result Value Ref Range Status   MRSA by PCR Next Gen NOT DETECTED NOT DETECTED Final    Comment: (NOTE) The GeneXpert MRSA Assay (FDA approved for NASAL specimens only), is one component of a comprehensive MRSA colonization surveillance program. It is not intended to diagnose MRSA infection nor to guide or monitor treatment for MRSA infections. Test performance is not FDA approved in patients less than 57 years old. Performed at [facility], [address] [place], [place] [address]     Procedures and diagnostic studies:  Portable chest 1 View Result Date: 09/09/2023 CLINICAL DATA:  Weakness EXAM: PORTABLE CHEST 1 VIEW COMPARISON:  12/05/2022 FINDINGS: The heart size and mediastinal contours are within normal limits. Coronary stents are noted. Both lungs are clear. The visualized skeletal structures are unremarkable. IMPRESSION: No active disease. Electronically Signed   By: Luke Bun M.D.   On: 09/09/2023 16:51               LOS: 0 days   Pilar Corrales  [facility]   Pager on [email or web address]. If 7PM-7AM, please contact night-coverage at [email or web address]     09/10/2023, 12:29 PM

## 2023-09-11 DIAGNOSIS — E1110 Type 2 diabetes mellitus with ketoacidosis without coma: Secondary | ICD-10-CM | POA: Diagnosis not present

## 2023-09-11 LAB — GLUCOSE, CAPILLARY
Glucose-Capillary: 339 mg/dL — ABNORMAL HIGH (ref 70–99)
Glucose-Capillary: 212 mg/dL — ABNORMAL HIGH (ref 70–99)
Glucose-Capillary: 74 mg/dL (ref 70–99)

## 2023-09-11 MED ORDER — CALCIUM CARBONATE ANTACID 500 MG PO CHEW: 1.0000 | CHEWABLE_TABLET | Freq: Every day | ORAL | Status: AC | PRN

## 2023-09-11 MED ORDER — INSULIN ASPART 100 UNIT/ML IJ SOLN
20.0000 [IU] | Freq: Three times a day (TID) | INTRAMUSCULAR | Status: DC
  Administered 2023-09-11 (×2): 20 [IU] via SUBCUTANEOUS
  Filled 2023-09-11 (×2): qty 1

## 2023-09-11 MED ORDER — LISINOPRIL 10 MG PO TABS
5.0000 mg | ORAL_TABLET | Freq: Every day | ORAL | Status: DC
  Administered 2023-09-11: 5 mg via ORAL
  Filled 2023-09-11: qty 1

## 2023-09-11 MED ORDER — INSULIN ASPART 100 UNIT/ML IJ SOLN: 20.0000 [IU] | Freq: Three times a day (TID) | INTRAMUSCULAR | Status: DC

## 2023-09-11 MED ORDER — LANTUS SOLOSTAR 100 UNIT/ML ~~LOC~~ SOPN: 20.0000 [IU] | PEN_INJECTOR | Freq: Every day | SUBCUTANEOUS | Status: DC

## 2023-09-11 MED ORDER — CARVEDILOL 3.125 MG PO TABS: 3.1250 mg | ORAL_TABLET | Freq: Two times a day (BID) | ORAL | Status: AC

## 2023-09-11 MED ORDER — LANTUS SOLOSTAR 100 UNIT/ML ~~LOC~~ SOPN: 20.0000 [IU] | PEN_INJECTOR | Freq: Every day | SUBCUTANEOUS | Status: AC

## 2023-09-11 NOTE — Progress Notes (Signed)
 Nutrition Eduction Note  RD consulted for nutrition education regarding diabetes.   Lab Results  Component Value Date   HGBA1C 12.8 (H) 09/09/2023   RD working remotely. Called patient via bedside telephone but unable to reach.   RD provided Carbohydrate Counting for People with Diabetes handout from the Academy of [employer] in AVS.  Body mass index is 23.24 kg/m.   Current diet order is Carb Modified, no meal intakes documented at this time. Labs and medications reviewed. Of note, patient appears to have been taking zinc  supplementation since June 2024, which could put her at risk for copper  deficiency. Will discontinue zinc  at this time. Consider checking copper  level.  RD to see patient on Monday 8/18 if appropriate to follow up on education.   Trude Ned RD, LDN Contact via [employer].

## 2023-09-11 NOTE — Discharge Summary (Signed)
 Physician Discharge Summary   Patient: Beth Carroll MRN: [id number] DOB: 1970-02-27  Admit date:     09/09/2023  Discharge date: 09/11/23  Discharge Physician: AIDA CHO   PCP: Rudolpho Norleen BIRCH, MD   Recommendations at discharge:   Follow-up with PCP in 1 week  Discharge Diagnoses: Principal Problem:   DKA (diabetic ketoacidosis) (HCC) Active Problems:   CAD S/P percutaneous coronary angioplasty   AKI (acute kidney injury) (HCC)   COPD (chronic obstructive pulmonary disease) (HCC)   Hypophosphatemia  Resolved Problems:   * No resolved hospital problems. *  Hospital Course:  Beth Carroll is a 53 y.o. female  with IDDM2 with previous history of DKA, CAD, PVD, atrial fibrillation, HFrEF and COPD, who presented to the hospital with general weakness, nausea and vomiting.  She said that her sugar had dropped to 40 so she ate a couple of hostess Twinkies.  Subsequently, she developed hyperglycemia.  When EMS arrived, her glucose was 481.   In the ED, glucose was 543.  She was found to have DKA.    Assessment and Plan:   DKA, brittle type II diabetes mellitus: DKA has resolved.  Resume Lantus  20 units nightly and NovoLog  20 units 3 times daily with meals.  She says she is familiar with management of diabetes and she knows what to her home concerning Brittle diabetes mellitus. Home insulin  regimen as follows: Lantus  20 units nightly NovoLog  20 units 3 times daily with meals.     Hypotension: Resolved. Hypertensive urgency: BP went up to 192/86 today.  She said she recently saw her PCP, Dr. Rudolpho, on 08/12/2023.  She had previously been taken off of carvedilol  but was asked to restart carvedilol  because her BP on that visit was in the 180s. Resume lisinopril  and carvedilol  at discharge.    AKI: Resolved Hypophosphatemia: Resolved     CAD, PVD: Continue aspirin  and Eliquis .  She said she no longer takes Plavix . Chronic HFpEF: Compensated. 2D echo in November 2024  showed EF estimated 50 to 55%, normal LV diastolic parameters.     Paroxysmal atrial fibrillation: Continue Eliquis      GERD: Continue Protonix .  Tums as needed.     COPD: Stable.  Bronchodilators as needed.   Abnormal urinalysis: No evidence of infection thus far.  No urinary symptoms, fever or leukocytosis.  No indication for antibiotics at this time.    Condition has improved and she is deemed stable for discharge home today.  Discharge plan was discussed with patient and Larnell (her partner) at the bedside.       Consultants: None Procedures performed: None Disposition: Home Diet recommendation:  Discharge Diet Orders (From admission, onward)     Start     Ordered   09/11/23 0000  Diet - low sodium heart healthy        09/11/23 1636   09/11/23 0000  Diet Carb Modified        09/11/23 1636           Cardiac and Carb modified diet DISCHARGE MEDICATION: Allergies as of 09/11/2023       Reactions   Amoxicillin    Other Reaction(s): throat closing up, sob, cp with amox, clarithromycin for h pylori   Clarithromycin    Other Reaction(s): throat closing up, sob, cp with amox, clarithromycin for h pylori.        Medication List     STOP taking these medications    clopidogrel  75 MG tablet Commonly known as: PLAVIX   TAKE these medications    acetaminophen  650 MG CR tablet Commonly known as: TYLENOL  Take 650 mg by mouth every 8 (eight) hours as needed for pain.   albuterol  108 (90 Base) MCG/ACT inhaler Commonly known as: VENTOLIN  HFA Inhale 2 puffs into the lungs every 6 (six) hours as needed for wheezing or shortness of breath.   ascorbic acid  500 MG tablet Commonly known as: VITAMIN C  Take 1 tablet (500 mg total) by mouth 2 (two) times daily.   aspirin  EC 81 MG tablet Take 81 mg by mouth daily. Swallow whole.   atorvastatin  80 MG tablet Commonly known as: LIPITOR  Take 80 mg by mouth daily.   B-D UF III MINI PEN NEEDLES 31G X 5 MM  Misc Generic drug: Insulin  Pen Needle See admin instructions.   calcium  carbonate 500 MG chewable tablet Commonly known as: TUMS - dosed in mg elemental calcium  Chew 1 tablet (200 mg of elemental calcium  total) by mouth daily as needed for heartburn or indigestion.   carvedilol  3.125 MG tablet Commonly known as: Coreg  Take 1 tablet (3.125 mg total) by mouth 2 (two) times daily.   CENTRUM SILVER 50+WOMEN PO Take 1 tablet by mouth daily.   Eliquis  5 MG Tabs tablet Generic drug: apixaban  TAKE 1 TABLET BY MOUTH TWICE A DAY   estradiol  0.1 MG/GM vaginal cream Commonly known as: ESTRACE  Estrogen Cream Instruction Discard applicator Apply pea sized amount to tip of finger to urethra before bed. Wash hands well after application. Use daily for one week then Monday, Wednesday and Friday   FreeStyle Libre 3 Sensor Misc USE 1 DEVICE EVERY 14 DAYS.   Insulin  Aspart FlexPen 100 UNIT/ML Commonly known as: NOVOLOG  Inject 20 Units into the skin with breakfast, with lunch, and with evening meal.   Lantus  SoloStar 100 UNIT/ML Solostar Pen Generic drug: insulin  glargine Inject 20 Units into the skin at bedtime. Start taking on: September 12, 2023   lisinopril  5 MG tablet Commonly known as: ZESTRIL  Take 5 mg by mouth daily.   pantoprazole  40 MG tablet Commonly known as: PROTONIX  Take 1 tablet (40 mg total) by mouth daily.   Spiriva  HandiHaler 18 MCG inhalation capsule Generic drug: tiotropium Place 18 mcg into inhaler and inhale daily.   zinc  sulfate (50mg  elemental zinc ) 220 (50 Zn) MG capsule Take 1 capsule (220 mg total) by mouth daily.        Discharge Exam: Filed Weights   09/09/23 1237 09/10/23 0300  Weight: 63.5 kg 65.3 kg   GEN: NAD SKIN: Warm and dry EYES: No pallor or icterus ENT: MMM CV: RRR PULM: CTA B ABD: soft, ND, NT, +BS CNS: AAO x 3, non focal EXT: Mild bilateral leg edema.  No erythema or tenderness.   Condition at discharge: good  The results of  significant diagnostics from this hospitalization (including imaging, microbiology, ancillary and laboratory) are listed below for reference.   Imaging Studies: Portable chest 1 View Result Date: 09/09/2023 CLINICAL DATA:  Weakness EXAM: PORTABLE CHEST 1 VIEW COMPARISON:  12/05/2022 FINDINGS: The heart size and mediastinal contours are within normal limits. Coronary stents are noted. Both lungs are clear. The visualized skeletal structures are unremarkable. IMPRESSION: No active disease. Electronically Signed   By: Luke Bun M.D.   On: 09/09/2023 16:51    Microbiology: Results for orders placed or performed during the hospital encounter of 09/09/23  MRSA Next Gen by PCR, Nasal     Status: None   Collection Time: 09/10/23  5:52 AM  Specimen: Nasal Mucosa; Nasal Swab  Result Value Ref Range Status   MRSA by PCR Next Gen NOT DETECTED NOT DETECTED Final    Comment: (NOTE) The GeneXpert MRSA Assay (FDA approved for NASAL specimens only), is one component of a comprehensive MRSA colonization surveillance program. It is not intended to diagnose MRSA infection nor to guide or monitor treatment for MRSA infections. Test performance is not FDA approved in patients less than 81 years old. Performed at [facility], [address] [place], [place] [address]     Labs: CBC: Recent Labs  Lab 09/09/23 1248  WBC 8.0  HGB 13.3  HCT 42.5  MCV 89.7  PLT 257   Basic Metabolic Panel: Recent Labs  Lab 09/09/23 1610 09/09/23 1955 09/10/23 0023 09/10/23 0601 09/10/23 0943  NA 137 136 137 137 136  K 3.5 3.1* 3.7 4.0 4.0  CL 104 105 106 109 106  CO2 10* 15* 24 23 21*  GLUCOSE 465* 312* 222* 158* 139*  BUN 28* 25* 22* 23* 20  CREATININE 0.98 0.89 0.63 0.51 0.60  CALCIUM  8.6* 8.6* 8.8* 8.6* 8.5*  PHOS  --   --  1.4*  --  3.6   Liver Function Tests: Recent Labs  Lab 09/09/23 1248  AST 19  ALT 12  ALKPHOS 132*  BILITOT 2.1*  PROT 8.5*  ALBUMIN  4.3   CBG: Recent Labs   Lab 09/10/23 2104 09/10/23 2316 09/11/23 0807 09/11/23 1113 09/11/23 1445  GLUCAP 270* 323* 339* 212* 74    Discharge time spent: greater than 30 minutes.  Signed: AIDA CHO, MD [facility] 09/11/2023

## 2023-09-11 NOTE — Plan of Care (Signed)
  Problem: Education: Goal: Ability to describe self-care measures that may prevent or decrease complications (Diabetes Survival Skills Education) will improve Outcome: Progressing   Problem: Coping: Goal: Ability to adjust to condition or change in health will improve Outcome: Progressing   Problem: Fluid Volume: Goal: Ability to maintain a balanced intake and output will improve Outcome: Progressing   Problem: Nutritional: Goal: Maintenance of adequate nutrition will improve Outcome: Progressing   Problem: Education: Goal: Ability to describe self-care measures that may prevent or decrease complications (Diabetes Survival Skills Education) will improve Outcome: Progressing   Problem: Fluid Volume: Goal: Ability to achieve a balanced intake and output will improve Outcome: Progressing   Problem: Metabolic: Goal: Ability to maintain appropriate glucose levels will improve Outcome: Progressing

## 2023-09-11 NOTE — Discharge Instructions (Signed)

## 2023-09-11 NOTE — Progress Notes (Signed)
 Discharge instructions reviewed with patient and husband, verbalized understanding. IV x2 and telemetry removed. Patient instructed to make follow up appointment with PCP to which she verbalized understanding. Patient transported to private vehicle via wheelchair.

## 2023-11-14 ENCOUNTER — Other Ambulatory Visit (INDEPENDENT_AMBULATORY_CARE_PROVIDER_SITE_OTHER): Payer: Self-pay | Admitting: Nurse Practitioner

## 2023-11-14 DIAGNOSIS — Z9889 Other specified postprocedural states: Secondary | ICD-10-CM

## 2023-11-16 ENCOUNTER — Ambulatory Visit (INDEPENDENT_AMBULATORY_CARE_PROVIDER_SITE_OTHER): Admitting: Nurse Practitioner

## 2023-11-16 ENCOUNTER — Encounter (INDEPENDENT_AMBULATORY_CARE_PROVIDER_SITE_OTHER)

## 2023-11-22 ENCOUNTER — Encounter (INDEPENDENT_AMBULATORY_CARE_PROVIDER_SITE_OTHER)

## 2023-11-22 ENCOUNTER — Ambulatory Visit (INDEPENDENT_AMBULATORY_CARE_PROVIDER_SITE_OTHER): Admitting: Nurse Practitioner

## 2023-12-16 ENCOUNTER — Ambulatory Visit (INDEPENDENT_AMBULATORY_CARE_PROVIDER_SITE_OTHER): Admitting: Nurse Practitioner

## 2023-12-16 ENCOUNTER — Encounter (INDEPENDENT_AMBULATORY_CARE_PROVIDER_SITE_OTHER)

## 2023-12-16 ENCOUNTER — Encounter (INDEPENDENT_AMBULATORY_CARE_PROVIDER_SITE_OTHER): Payer: Self-pay

## 2024-01-17 ENCOUNTER — Ambulatory Visit (INDEPENDENT_AMBULATORY_CARE_PROVIDER_SITE_OTHER): Admitting: Vascular Surgery

## 2024-01-17 ENCOUNTER — Other Ambulatory Visit (INDEPENDENT_AMBULATORY_CARE_PROVIDER_SITE_OTHER)

## 2024-01-17 ENCOUNTER — Encounter (INDEPENDENT_AMBULATORY_CARE_PROVIDER_SITE_OTHER): Payer: Self-pay | Admitting: Vascular Surgery

## 2024-01-17 VITALS — BP 146/72 | HR 68 | Resp 18 | Ht 66.0 in | Wt 165.0 lb

## 2024-01-17 DIAGNOSIS — E119 Type 2 diabetes mellitus without complications: Secondary | ICD-10-CM | POA: Diagnosis not present

## 2024-01-17 DIAGNOSIS — I739 Peripheral vascular disease, unspecified: Secondary | ICD-10-CM

## 2024-01-17 DIAGNOSIS — I10 Essential (primary) hypertension: Secondary | ICD-10-CM | POA: Diagnosis not present

## 2024-01-17 DIAGNOSIS — Z9889 Other specified postprocedural states: Secondary | ICD-10-CM

## 2024-01-17 LAB — VAS US ABI WITH/WO TBI
Left ABI: 0.95
Right ABI: 1.04

## 2024-01-17 NOTE — Progress Notes (Signed)
 "   MRN : [id number]  Beth Carroll is a 53 y.o. ([birth year]) female who presents with chief complaint of  Chief Complaint  Patient presents with   Follow-up    6 month follow up + ABI   .  History of Present Illness:  Discussed the use of AI scribe software for clinical note transcription with the patient, who gave verbal consent to proceed.  History of Present Illness Beth Carroll is a 53 year old female with peripheral artery disease and type 2 diabetes mellitus who presents for routine vascular surgery follow-up.  She is able to ambulate and has been increasing her activity level. Recent vascular studies showed ankle-brachial indices of 1.04 on the right and 0.95 on the left with normal toe pressures.  She has right knee instability with buckling and now uses a knee brace. Orthopedics may consider knee replacement in the future. She has had recent weight gain but is motivated to increase walking to improve circulation and decrease lower extremity swelling while working to improve glycemic control.    Results   Current Outpatient Medications  Medication Sig Dispense Refill   acetaminophen  (TYLENOL ) 650 MG CR tablet Take 650 mg by mouth every 8 (eight) hours as needed for pain.     albuterol  (VENTOLIN  HFA) 108 (90 Base) MCG/ACT inhaler Inhale 2 puffs into the lungs every 6 (six) hours as needed for wheezing or shortness of breath.     ascorbic acid  (VITAMIN C ) 500 MG tablet Take 1 tablet (500 mg total) by mouth 2 (two) times daily. 60 tablet 0   aspirin  EC 81 MG tablet Take 81 mg by mouth daily. Swallow whole.     atorvastatin  (LIPITOR ) 80 MG tablet Take 80 mg by mouth daily.     B-D UF III MINI PEN NEEDLES 31G X 5 MM MISC See admin instructions.     calcium  carbonate (TUMS - DOSED IN MG ELEMENTAL CALCIUM ) 500 MG chewable tablet Chew 1 tablet (200 mg of elemental calcium  total) by mouth daily as needed for heartburn or indigestion.     carvedilol  (COREG ) 3.125 MG tablet Take 1  tablet (3.125 mg total) by mouth 2 (two) times daily.     Continuous Glucose Sensor (FREESTYLE LIBRE 3 SENSOR) MISC USE 1 DEVICE EVERY 14 DAYS.     ELIQUIS  5 MG TABS tablet TAKE 1 TABLET BY MOUTH TWICE A DAY 60 tablet 6   estradiol  (ESTRACE ) 0.1 MG/GM vaginal cream Estrogen Cream Instruction Discard applicator Apply pea sized amount to tip of finger to urethra before bed. Wash hands well after application. Use daily for one week then Monday, Wednesday and Friday 42.5 g 12   Insulin  Aspart FlexPen (NOVOLOG ) 100 UNIT/ML Inject 20 Units into the skin with breakfast, with lunch, and with evening meal.     insulin  glargine (LANTUS  SOLOSTAR) 100 UNIT/ML Solostar Pen Inject 20 Units into the skin at bedtime.     lisinopril  (ZESTRIL ) 5 MG tablet Take 5 mg by mouth daily.     Multiple Vitamins-Minerals (CENTRUM SILVER 50+WOMEN PO) Take 1 tablet by mouth daily.     pantoprazole  (PROTONIX ) 40 MG tablet Take 1 tablet (40 mg total) by mouth daily. 30 tablet 0   SPIRIVA  HANDIHALER 18 MCG inhalation capsule Place 18 mcg into inhaler and inhale daily.     zinc  sulfate 220 (50 Zn) MG capsule Take 1 capsule (220 mg total) by mouth daily. 30 capsule 0   No current facility-administered medications for this visit.  Past Medical History:  Diagnosis Date   Acute deep vein thrombosis (DVT) of right lower extremity (HCC)    ADHD (attention deficit hyperactivity disorder)    AKI (acute kidney injury) 04/30/2022   Anterior myocardial infarction [facility])    Aortic atherosclerosis    Atrial fibrillation (HCC) 06/2022   a.) CHA2DS2-VASc = 7 (sex, HFrEF, HTN, DVT x2, prior MI, T2DM) as of 02/17/2023; b.) cardiac rate/rhythm maintained intrinsically without pharmacological intervention; chronically anticoagulated using apixaban    Bladder tumor    COPD (chronic obstructive pulmonary disease) (HCC)    Coronary artery disease 12/15/2020   a.) LHC/PCI 12/15/2020: 99% mLAD (2.25 x 18 mm Onyx Frontier DES), 90% RI (2.75 x 22  mm Onyx Frontier DES), 40% m-dLAD; b.) MV 12/06/2022: EF 51%, evidence of transmural infarct in m-dLAD territory with no sig ischemia   Diabetes mellitus, type 2 (HCC)    Essential hypertension    GERD (gastroesophageal reflux disease)    HFrEF (heart failure with reduced ejection fraction) (HCC)    a.) TTE 12/10/2020: EF 40-45%, apical/periapical HK, G2DD; b.) LHC 12/15/2020: EF 35-45%; c.) TTE 06/26/2021: EF 40-45%, LVH, G2DD; d.) TTE 03/21/2022: EF 55-60%, LV/apical sep/ant wall HK, Triv MR, AoV sclerosis; e.) <V 12/06/2022: EF 50-55%, mid-apical anterosep wall/anterior seg/apical seg AK, triv MR   High anion gap metabolic acidosis 04/30/2022   Hyperlipemia    Hypophosphatemia 04/30/2022   Ischemic cardiomyopathy    a.) TTE 12/10/2020: EF 40-45%; b.) LHC 12/15/2020: EF 35-45%; c.) TTE 06/26/2021: EF 40-45%; d.) TTE 03/21/2022: EF 55-60%; e.) TTE 12/06/2022: 50-55%; f.) MV 12/06/2022: 51%   Long term current use of aspirin     Long term current use of clopidogrel     Meningioma (HCC)    a.) CT head and MRI brain 08/21/2021: LEFT parietal meningioma   Nausea vomiting and diarrhea 04/30/2022   On apixaban  therapy    Orthostatic hypotension    PAD (peripheral artery disease)    a.) multiple PTA with stents and thrombectomies   Sepsis secondary to UTI (HCC) 04/30/2022   Tobacco use     Past Surgical History:  Procedure Laterality Date   AMPUTATION Right 06/03/2022   Procedure: AMPUTATION RAY ( 1ST AND 2ND TOE);  Surgeon: Marea Selinda RAMAN, MD;  Location: [facility];  Service: Vascular;  Laterality: Right;   CHOLECYSTECTOMY     CORONARY STENT INTERVENTION N/A 12/15/2020   Procedure: CORONARY STENT INTERVENTION;  Surgeon: Darron Deatrice LABOR, MD;  Location: [facility];  Service: Cardiovascular;  Laterality: N/A;   CYSTOSCOPY W/ RETROGRADES Bilateral 02/18/2023   Procedure: CYSTOSCOPY WITH RETROGRADE PYELOGRAM;  Surgeon: Francisca Redell BROCKS, MD;  Location: [facility];  Service: Urology;   Laterality: Bilateral;   FULGURATION OF BLADDER TUMOR  02/18/2023   Procedure: FULGURATION OF BLADDER TUMOR WITH BIOSPY;  Surgeon: Francisca Redell BROCKS, MD;  Location: [facility];  Service: Urology;;   INCISION AND DRAINAGE OF WOUND Right 07/09/2022   Procedure: IRRIGATION AND DEBRIDEMENT WOUND;  Surgeon: Neill Boas, DPM;  Location: [facility];  Service: Podiatry;  Laterality: Right;   LEFT HEART CATH AND CORONARY ANGIOGRAPHY N/A 12/15/2020   Procedure: LEFT HEART CATH AND CORONARY ANGIOGRAPHY;  Surgeon: Darron Deatrice LABOR, MD;  Location: [facility];  Service: Cardiovascular;  Laterality: N/A;   LOWER EXTREMITY ANGIOGRAPHY Left 12/10/2020   Procedure: Lower Extremity Angiography;  Surgeon: Marea Selinda RAMAN, MD;  Location: [facility];  Service: Cardiovascular;  Laterality: Left;   LOWER EXTREMITY ANGIOGRAPHY Right 12/25/2020  Procedure: LOWER EXTREMITY ANGIOGRAPHY;  Surgeon: Marea Selinda RAMAN, MD;  Location: [facility];  Service: Cardiovascular;  Laterality: Right;   LOWER EXTREMITY ANGIOGRAPHY Right 08/19/2021   Procedure: Lower Extremity Angiography;  Surgeon: Marea Selinda RAMAN, MD;  Location: [facility];  Service: Cardiovascular;  Laterality: Right;   LOWER EXTREMITY ANGIOGRAPHY Right 08/19/2021   Procedure: Lower Extremity Angiography;  Surgeon: Marea Selinda RAMAN, MD;  Location: [facility];  Service: Cardiovascular;  Laterality: Right;   LOWER EXTREMITY ANGIOGRAPHY Right 04/24/2022   Procedure: Lower Extremity Angiography;  Surgeon: Clarice Martin, MD;  Location: [facility];  Service: Cardiovascular;  Laterality: Right;   ORIF FEMUR FRACTURE Right 12/14/2021   Procedure: OPEN REDUCTION INTERNAL FIXATION RIGHT DISTAL FEMUR;  Surgeon: Kendal Franky SQUIBB, MD;  Location: [facility];  Service: Orthopedics;  Laterality: Right;     Social History[1]    Family History  Problem Relation Age of Onset   Hypertension Father      Allergies[2]   REVIEW OF SYSTEMS  (Negative unless checked)  Constitutional: [] Weight loss  [] Fever  [] Chills Cardiac: [] Chest pain   [] Chest pressure   [] Palpitations   [] Shortness of breath when laying flat   [] Shortness of breath at rest   [x] Shortness of breath with exertion. Vascular:  [] Pain in legs with walking   [] Pain in legs at rest   [] Pain in legs when laying flat   [] Claudication   [] Pain in feet when walking  [] Pain in feet at rest  [] Pain in feet when laying flat   [] History of DVT   [] Phlebitis   [x] Swelling in legs   [] Varicose veins   [] Non-healing ulcers Pulmonary:   [] Uses home oxygen   [] Productive cough   [] Hemoptysis   [] Wheeze  [x] COPD   [] Asthma Neurologic:  [] Dizziness  [] Blackouts   [] Seizures   [] History of stroke   [] History of TIA  [] Aphasia   [] Temporary blindness   [] Dysphagia   [] Weakness or numbness in arms   [] Weakness or numbness in legs Musculoskeletal:  [x] Arthritis   [] Joint swelling   [x] Joint pain   [] Low back pain Hematologic:  [] Easy bruising  [] Easy bleeding   [] Hypercoagulable state   [] Anemic   Gastrointestinal:  [] Blood in stool   [] Vomiting blood  [] Gastroesophageal reflux/heartburn   [] Abdominal pain Genitourinary:  [] Chronic kidney disease   [] Difficult urination  [] Frequent urination  [] Burning with urination   [] Hematuria Skin:  [] Rashes   [] Ulcers   [] Wounds Psychological:  [] History of anxiety   []  History of major depression.  Physical Examination  BP (!) 146/72 (BP Location: Left Arm, Patient Position: Sitting, Cuff Size: Normal)   Pulse 68   Resp 18   Ht 5' 6 (1.676 m)   Wt 165 lb (74.8 kg)   LMP 01/26/2015   BMI 26.63 kg/m  Gen:  WD/WN, NAD Head: [place]/AT, No temporalis wasting. Ear/Nose/Throat: Hearing grossly intact, nares w/o erythema or drainage Eyes: Conjunctiva clear. Sclera non-icteric Neck: Supple.  Trachea midline Pulmonary:  Good air movement, no use of accessory muscles.  Cardiac: RRR, no JVD Vascular:  Vessel Right Left  Radial Palpable Palpable                           PT Palpable Palpable  DP Palpable Palpable   Gastrointestinal: soft, non-tender/non-distended. No guarding/reflex.  Musculoskeletal: M/S 5/5 throughout.  No deformity or atrophy. 1-2+ BLE edema. Neurologic: Sensation grossly intact in extremities.  Symmetrical.  Speech is fluent.  Psychiatric: Judgment intact, Mood & affect appropriate for pt's clinical situation. Dermatologic: No rashes or ulcers noted.  No cellulitis or open wounds.  Physical Exam     Labs No results found for this or any previous visit (from the past 2160 hours).  Radiology No results found.  Assessment/Plan Assessment & Plan Peripheral artery disease Peripheral artery disease well-managed with normal ABI and toe pressures. Low risk for surgical intervention if needed for knee. Has had extensive revascularization previously. - Reviewed recent vascular studies showing normal ABI and toe pressures. - Recommended continued ambulation to promote circulation and minimize edema. - Advised vascular follow-up in six months. No change in medications  Essential hypertension blood pressure control important in reducing the progression of atherosclerotic disease. On appropriate oral medications.   Type 2 diabetes mellitus without complications (HCC) blood glucose control important in reducing the progression of atherosclerotic disease. Also, involved in wound healing. On appropriate medications.   Selinda Gu, MD  01/17/2024 3:35 PM    This note was created with Dragon medical transcription system.  Any errors from dictation are purely unintentional     [1]  Social History Tobacco Use   Smoking status: Former    Current packs/day: 0.00    Types: Cigarettes    Quit date: 12/09/2020    Years since quitting: 3.1    Passive exposure: Never   Smokeless tobacco: Never  Vaping Use   Vaping status: Never Used  Substance Use Topics   Alcohol use: Not Currently   Drug use: Never   [2]  Allergies Allergen Reactions   Amoxicillin     Other Reaction(s): throat closing up, sob, cp with amox, clarithromycin for h pylori   Clarithromycin     Other Reaction(s): throat closing up, sob, cp with amox, clarithromycin for h pylori.   "

## 2024-01-17 NOTE — Assessment & Plan Note (Signed)
 blood glucose control important in reducing the progression of atherosclerotic disease. Also, involved in wound healing. On appropriate medications.

## 2024-01-17 NOTE — Assessment & Plan Note (Signed)
 blood pressure control important in reducing the progression of atherosclerotic disease. On appropriate oral medications.

## 2024-07-20 ENCOUNTER — Encounter (INDEPENDENT_AMBULATORY_CARE_PROVIDER_SITE_OTHER)

## 2024-07-20 ENCOUNTER — Ambulatory Visit (INDEPENDENT_AMBULATORY_CARE_PROVIDER_SITE_OTHER): Admitting: Nurse Practitioner
